# Patient Record
Sex: Male | Born: 1950
Health system: Southern US, Community
[De-identification: ages and names within clinical notes are randomized; demographics above are authoritative.]

## PROBLEM LIST (undated history)

## (undated) DIAGNOSIS — E119 Type 2 diabetes mellitus without complications: Secondary | ICD-10-CM

## (undated) DIAGNOSIS — F329 Major depressive disorder, single episode, unspecified: Secondary | ICD-10-CM

## (undated) DIAGNOSIS — I209 Angina pectoris, unspecified: Secondary | ICD-10-CM

## (undated) DIAGNOSIS — M5440 Lumbago with sciatica, unspecified side: Secondary | ICD-10-CM

## (undated) DIAGNOSIS — K852 Alcohol induced acute pancreatitis without necrosis or infection: Secondary | ICD-10-CM

## (undated) DIAGNOSIS — I509 Heart failure, unspecified: Secondary | ICD-10-CM

## (undated) DIAGNOSIS — K311 Adult hypertrophic pyloric stenosis: Secondary | ICD-10-CM

## (undated) DIAGNOSIS — J449 Chronic obstructive pulmonary disease, unspecified: Secondary | ICD-10-CM

## (undated) DIAGNOSIS — K219 Gastro-esophageal reflux disease without esophagitis: Secondary | ICD-10-CM

## (undated) DIAGNOSIS — G629 Polyneuropathy, unspecified: Secondary | ICD-10-CM

## (undated) DIAGNOSIS — R519 Headache, unspecified: Secondary | ICD-10-CM

## (undated) DIAGNOSIS — K863 Pseudocyst of pancreas: Secondary | ICD-10-CM

## (undated) DIAGNOSIS — Z720 Tobacco use: Secondary | ICD-10-CM

## (undated) DIAGNOSIS — M79672 Pain in left foot: Secondary | ICD-10-CM

## (undated) DIAGNOSIS — Z7901 Long term (current) use of anticoagulants: Secondary | ICD-10-CM

## (undated) DIAGNOSIS — F1010 Alcohol abuse, uncomplicated: Secondary | ICD-10-CM

## (undated) DIAGNOSIS — R634 Abnormal weight loss: Secondary | ICD-10-CM

## (undated) DIAGNOSIS — Q8901 Asplenia (congenital): Secondary | ICD-10-CM

## (undated) DIAGNOSIS — I482 Chronic atrial fibrillation, unspecified: Secondary | ICD-10-CM

## (undated) DIAGNOSIS — M25572 Pain in left ankle and joints of left foot: Secondary | ICD-10-CM

## (undated) DIAGNOSIS — I4891 Unspecified atrial fibrillation: Secondary | ICD-10-CM

## (undated) DIAGNOSIS — M542 Cervicalgia: Secondary | ICD-10-CM

## (undated) DIAGNOSIS — Z972 Presence of dental prosthetic device (complete) (partial): Secondary | ICD-10-CM

## (undated) DIAGNOSIS — M545 Low back pain: Secondary | ICD-10-CM

## (undated) DIAGNOSIS — T8859XA Other complications of anesthesia, initial encounter: Secondary | ICD-10-CM

## (undated) DIAGNOSIS — M7711 Lateral epicondylitis, right elbow: Secondary | ICD-10-CM

## (undated) DIAGNOSIS — I10 Essential (primary) hypertension: Secondary | ICD-10-CM

## (undated) DIAGNOSIS — K861 Other chronic pancreatitis: Secondary | ICD-10-CM

## (undated) DIAGNOSIS — F419 Anxiety disorder, unspecified: Secondary | ICD-10-CM

## (undated) DIAGNOSIS — I4821 Permanent atrial fibrillation: Secondary | ICD-10-CM

## (undated) DIAGNOSIS — Z973 Presence of spectacles and contact lenses: Secondary | ICD-10-CM

## (undated) DIAGNOSIS — R3129 Other microscopic hematuria: Secondary | ICD-10-CM

## (undated) DIAGNOSIS — G4700 Insomnia, unspecified: Secondary | ICD-10-CM

## (undated) DIAGNOSIS — J42 Unspecified chronic bronchitis: Secondary | ICD-10-CM

## (undated) DIAGNOSIS — T4145XA Adverse effect of unspecified anesthetic, initial encounter: Secondary | ICD-10-CM

## (undated) DIAGNOSIS — E440 Moderate protein-calorie malnutrition: Secondary | ICD-10-CM

## (undated) DIAGNOSIS — K859 Acute pancreatitis without necrosis or infection, unspecified: Secondary | ICD-10-CM

## (undated) DIAGNOSIS — M1990 Unspecified osteoarthritis, unspecified site: Secondary | ICD-10-CM

## (undated) DIAGNOSIS — J189 Pneumonia, unspecified organism: Secondary | ICD-10-CM

## (undated) DIAGNOSIS — E785 Hyperlipidemia, unspecified: Secondary | ICD-10-CM

## (undated) DIAGNOSIS — I4892 Unspecified atrial flutter: Secondary | ICD-10-CM

## (undated) DIAGNOSIS — R51 Headache: Secondary | ICD-10-CM

## (undated) DIAGNOSIS — M79601 Pain in right arm: Secondary | ICD-10-CM

## (undated) DIAGNOSIS — G8929 Other chronic pain: Secondary | ICD-10-CM

## (undated) HISTORY — PX: BACK SURGERY: SHX140

---
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
## 1972-07-25 HISTORY — PX: NECK SURGERY: SHX720

## 1999-04-29 ENCOUNTER — Encounter: Payer: Self-pay | Admitting: Gastroenterology

## 1999-04-29 ENCOUNTER — Inpatient Hospital Stay (HOSPITAL_COMMUNITY): Admission: EM | Admit: 1999-04-29 | Discharge: 1999-04-30 | Payer: Self-pay | Admitting: Internal Medicine

## 2000-08-22 ENCOUNTER — Inpatient Hospital Stay (HOSPITAL_COMMUNITY): Admission: EM | Admit: 2000-08-22 | Discharge: 2000-08-26 | Payer: Self-pay | Admitting: Emergency Medicine

## 2000-08-23 ENCOUNTER — Encounter: Payer: Self-pay | Admitting: Internal Medicine

## 2000-08-24 ENCOUNTER — Encounter: Payer: Self-pay | Admitting: Internal Medicine

## 2000-09-01 ENCOUNTER — Emergency Department (HOSPITAL_COMMUNITY): Admission: EM | Admit: 2000-09-01 | Discharge: 2000-09-02 | Payer: Self-pay | Admitting: Emergency Medicine

## 2000-09-03 ENCOUNTER — Inpatient Hospital Stay (HOSPITAL_COMMUNITY): Admission: EM | Admit: 2000-09-03 | Discharge: 2000-10-13 | Payer: Self-pay | Admitting: Emergency Medicine

## 2000-09-03 ENCOUNTER — Encounter (INDEPENDENT_AMBULATORY_CARE_PROVIDER_SITE_OTHER): Payer: Self-pay

## 2000-09-03 ENCOUNTER — Encounter: Payer: Self-pay | Admitting: General Surgery

## 2000-09-03 ENCOUNTER — Encounter: Payer: Self-pay | Admitting: Internal Medicine

## 2000-09-03 ENCOUNTER — Encounter: Payer: Self-pay | Admitting: Emergency Medicine

## 2000-09-04 ENCOUNTER — Encounter: Payer: Self-pay | Admitting: General Surgery

## 2000-09-05 ENCOUNTER — Encounter: Payer: Self-pay | Admitting: Internal Medicine

## 2000-09-07 ENCOUNTER — Encounter: Payer: Self-pay | Admitting: General Surgery

## 2000-09-11 ENCOUNTER — Encounter: Payer: Self-pay | Admitting: Internal Medicine

## 2000-09-15 ENCOUNTER — Encounter: Payer: Self-pay | Admitting: General Surgery

## 2000-09-19 ENCOUNTER — Encounter: Payer: Self-pay | Admitting: General Surgery

## 2000-09-20 ENCOUNTER — Encounter: Payer: Self-pay | Admitting: General Surgery

## 2000-09-22 ENCOUNTER — Encounter: Payer: Self-pay | Admitting: General Surgery

## 2000-09-25 ENCOUNTER — Encounter: Payer: Self-pay | Admitting: General Surgery

## 2000-09-26 ENCOUNTER — Encounter: Payer: Self-pay | Admitting: General Surgery

## 2000-10-02 ENCOUNTER — Encounter: Payer: Self-pay | Admitting: General Surgery

## 2000-10-03 ENCOUNTER — Encounter: Payer: Self-pay | Admitting: General Surgery

## 2000-10-09 ENCOUNTER — Encounter: Payer: Self-pay | Admitting: General Surgery

## 2000-10-10 ENCOUNTER — Encounter: Payer: Self-pay | Admitting: General Surgery

## 2000-10-23 ENCOUNTER — Encounter: Admission: RE | Admit: 2000-10-23 | Discharge: 2001-01-21 | Payer: Self-pay | Admitting: General Surgery

## 2001-07-25 HISTORY — PX: SPLENECTOMY: SUR1306

## 2003-02-10 ENCOUNTER — Ambulatory Visit (HOSPITAL_COMMUNITY): Admission: RE | Admit: 2003-02-10 | Discharge: 2003-02-10 | Payer: Self-pay | Admitting: Internal Medicine

## 2003-02-10 ENCOUNTER — Encounter: Payer: Self-pay | Admitting: Internal Medicine

## 2003-06-03 ENCOUNTER — Emergency Department (HOSPITAL_COMMUNITY): Admission: EM | Admit: 2003-06-03 | Discharge: 2003-06-03 | Payer: Self-pay | Admitting: Emergency Medicine

## 2004-12-20 ENCOUNTER — Inpatient Hospital Stay (HOSPITAL_COMMUNITY): Admission: EM | Admit: 2004-12-20 | Discharge: 2004-12-24 | Payer: Self-pay | Admitting: Emergency Medicine

## 2005-01-10 ENCOUNTER — Ambulatory Visit (HOSPITAL_COMMUNITY): Admission: RE | Admit: 2005-01-10 | Discharge: 2005-01-10 | Payer: Self-pay | Admitting: Internal Medicine

## 2006-02-09 ENCOUNTER — Encounter: Admission: RE | Admit: 2006-02-09 | Discharge: 2006-02-09 | Payer: Self-pay | Admitting: Gastroenterology

## 2006-10-24 ENCOUNTER — Inpatient Hospital Stay (HOSPITAL_COMMUNITY): Admission: RE | Admit: 2006-10-24 | Discharge: 2006-10-25 | Payer: Self-pay | Admitting: Neurosurgery

## 2006-10-24 HISTORY — PX: POSTERIOR LAMINECTOMY / DECOMPRESSION LUMBAR SPINE: SUR740

## 2006-12-15 ENCOUNTER — Inpatient Hospital Stay (HOSPITAL_COMMUNITY): Admission: EM | Admit: 2006-12-15 | Discharge: 2006-12-19 | Payer: Self-pay | Admitting: Emergency Medicine

## 2007-02-18 ENCOUNTER — Inpatient Hospital Stay (HOSPITAL_COMMUNITY): Admission: EM | Admit: 2007-02-18 | Discharge: 2007-02-25 | Payer: Self-pay | Admitting: Emergency Medicine

## 2008-09-27 ENCOUNTER — Inpatient Hospital Stay (HOSPITAL_COMMUNITY): Admission: EM | Admit: 2008-09-27 | Discharge: 2008-10-01 | Payer: Self-pay | Admitting: Emergency Medicine

## 2009-01-06 ENCOUNTER — Emergency Department (HOSPITAL_COMMUNITY): Admission: EM | Admit: 2009-01-06 | Discharge: 2009-01-06 | Payer: Self-pay | Admitting: Emergency Medicine

## 2010-11-01 LAB — DIFFERENTIAL
Basophils Absolute: 0 10*3/uL (ref 0.0–0.1)
Basophils Relative: 0 % (ref 0–1)
Eosinophils Absolute: 0.2 10*3/uL (ref 0.0–0.7)
Eosinophils Relative: 2 % (ref 0–5)
Lymphocytes Relative: 16 % (ref 12–46)
Lymphs Abs: 1.8 10*3/uL (ref 0.7–4.0)
Monocytes Absolute: 0.9 10*3/uL (ref 0.1–1.0)
Monocytes Relative: 9 % (ref 3–12)
Neutro Abs: 7.9 10*3/uL — ABNORMAL HIGH (ref 1.7–7.7)
Neutrophils Relative %: 73 % (ref 43–77)

## 2010-11-01 LAB — CBC
HCT: 51.9 % (ref 39.0–52.0)
Hemoglobin: 17.6 g/dL — ABNORMAL HIGH (ref 13.0–17.0)
MCHC: 34 g/dL (ref 30.0–36.0)
MCV: 92.6 fL (ref 78.0–100.0)
Platelets: 232 10*3/uL (ref 150–400)
RBC: 5.6 MIL/uL (ref 4.22–5.81)
RDW: 15.4 % (ref 11.5–15.5)
WBC: 10.8 10*3/uL — ABNORMAL HIGH (ref 4.0–10.5)

## 2010-11-01 LAB — COMPREHENSIVE METABOLIC PANEL
ALT: 125 U/L — ABNORMAL HIGH (ref 0–53)
AST: 127 U/L — ABNORMAL HIGH (ref 0–37)
Albumin: 3.7 g/dL (ref 3.5–5.2)
Alkaline Phosphatase: 115 U/L (ref 39–117)
BUN: 9 mg/dL (ref 6–23)
CO2: 25 mEq/L (ref 19–32)
Calcium: 8.9 mg/dL (ref 8.4–10.5)
Chloride: 107 mEq/L (ref 96–112)
Creatinine, Ser: 0.83 mg/dL (ref 0.4–1.5)
GFR calc Af Amer: >60 mL/min (ref >60)
GFR calc non Af Amer: >60 mL/min (ref >60)
Glucose, Bld: 162 mg/dL — ABNORMAL HIGH (ref 70–99)
Potassium: 4.1 mEq/L (ref 3.5–5.1)
Sodium: 136 mEq/L (ref 135–145)
Total Bilirubin: 1.2 mg/dL (ref 0.3–1.2)
Total Protein: 7.7 g/dL (ref 6.0–8.3)

## 2010-11-01 LAB — POCT CARDIAC MARKERS
CKMB, poc: <1 ng/mL — ABNORMAL LOW (ref 1.0–8.0)
Myoglobin, poc: 46.1 ng/mL (ref 12–200)
Troponin i, poc: <0.05 ng/mL (ref 0.00–0.09)

## 2010-11-01 LAB — LIPASE, BLOOD: Lipase: 149 U/L — ABNORMAL HIGH (ref 11–59)

## 2010-11-04 LAB — BASIC METABOLIC PANEL
BUN: 6 mg/dL (ref 6–23)
BUN: 9 mg/dL (ref 6–23)
CO2: 24 mEq/L (ref 19–32)
CO2: 25 mEq/L (ref 19–32)
Calcium: 8.6 mg/dL (ref 8.4–10.5)
Calcium: 8.9 mg/dL (ref 8.4–10.5)
Chloride: 104 mEq/L (ref 96–112)
Chloride: 105 mEq/L (ref 96–112)
Creatinine, Ser: 0.93 mg/dL (ref 0.4–1.5)
Creatinine, Ser: 0.95 mg/dL (ref 0.4–1.5)
GFR calc Af Amer: >60 mL/min (ref >60)
GFR calc Af Amer: >60 mL/min (ref >60)
GFR calc non Af Amer: >60 mL/min (ref >60)
GFR calc non Af Amer: >60 mL/min (ref >60)
Glucose, Bld: 120 mg/dL — ABNORMAL HIGH (ref 70–99)
Glucose, Bld: 125 mg/dL — ABNORMAL HIGH (ref 70–99)
Potassium: 3.6 mEq/L (ref 3.5–5.1)
Potassium: 4.1 mEq/L (ref 3.5–5.1)
Sodium: 134 mEq/L — ABNORMAL LOW (ref 135–145)
Sodium: 137 mEq/L (ref 135–145)

## 2010-11-04 LAB — URINALYSIS, ROUTINE W REFLEX MICROSCOPIC
Glucose, UA: NEGATIVE mg/dL
Hgb urine dipstick: NEGATIVE
Ketones, ur: 15 mg/dL — AB
Nitrite: POSITIVE — AB
Protein, ur: NEGATIVE mg/dL
Specific Gravity, Urine: 1.024 (ref 1.005–1.030)
Urobilinogen, UA: 4 mg/dL — ABNORMAL HIGH (ref 0.0–1.0)
pH: 6.5 (ref 5.0–8.0)

## 2010-11-04 LAB — HEPATIC FUNCTION PANEL
ALT: 163 U/L — ABNORMAL HIGH (ref 0–53)
ALT: 99 U/L — ABNORMAL HIGH (ref 0–53)
AST: 127 U/L — ABNORMAL HIGH (ref 0–37)
AST: 87 U/L — ABNORMAL HIGH (ref 0–37)
Albumin: 2.9 g/dL — ABNORMAL LOW (ref 3.5–5.2)
Albumin: 3.4 g/dL — ABNORMAL LOW (ref 3.5–5.2)
Alkaline Phosphatase: 123 U/L — ABNORMAL HIGH (ref 39–117)
Alkaline Phosphatase: 170 U/L — ABNORMAL HIGH (ref 39–117)
Bilirubin, Direct: 0.8 mg/dL — ABNORMAL HIGH (ref 0.0–0.3)
Bilirubin, Direct: 1 mg/dL — ABNORMAL HIGH (ref 0.0–0.3)
Indirect Bilirubin: 1.3 mg/dL — ABNORMAL HIGH (ref 0.3–0.9)
Indirect Bilirubin: 1.4 mg/dL — ABNORMAL HIGH (ref 0.3–0.9)
Total Bilirubin: 2.2 mg/dL — ABNORMAL HIGH (ref 0.3–1.2)
Total Bilirubin: 2.3 mg/dL — ABNORMAL HIGH (ref 0.3–1.2)
Total Protein: 6.8 g/dL (ref 6.0–8.3)
Total Protein: 6.9 g/dL (ref 6.0–8.3)

## 2010-11-04 LAB — COMPREHENSIVE METABOLIC PANEL
ALT: 252 U/L — ABNORMAL HIGH (ref 0–53)
ALT: 67 U/L — ABNORMAL HIGH (ref 0–53)
ALT: 94 U/L — ABNORMAL HIGH (ref 0–53)
AST: 34 U/L (ref 0–37)
AST: 352 U/L — ABNORMAL HIGH (ref 0–37)
AST: 49 U/L — ABNORMAL HIGH (ref 0–37)
Albumin: 2.9 g/dL — ABNORMAL LOW (ref 3.5–5.2)
Albumin: 3.1 g/dL — ABNORMAL LOW (ref 3.5–5.2)
Albumin: 3.8 g/dL (ref 3.5–5.2)
Alkaline Phosphatase: 134 U/L — ABNORMAL HIGH (ref 39–117)
Alkaline Phosphatase: 86 U/L (ref 39–117)
Alkaline Phosphatase: 95 U/L (ref 39–117)
BUN: 12 mg/dL (ref 6–23)
BUN: 5 mg/dL — ABNORMAL LOW (ref 6–23)
BUN: 7 mg/dL (ref 6–23)
CO2: 23 mEq/L (ref 19–32)
CO2: 23 mEq/L (ref 19–32)
CO2: 24 mEq/L (ref 19–32)
Calcium: 8.5 mg/dL (ref 8.4–10.5)
Calcium: 8.7 mg/dL (ref 8.4–10.5)
Calcium: 9.2 mg/dL (ref 8.4–10.5)
Chloride: 101 mEq/L (ref 96–112)
Chloride: 102 mEq/L (ref 96–112)
Chloride: 104 mEq/L (ref 96–112)
Creatinine, Ser: 0.75 mg/dL (ref 0.4–1.5)
Creatinine, Ser: 0.85 mg/dL (ref 0.4–1.5)
Creatinine, Ser: 1.04 mg/dL (ref 0.4–1.5)
GFR calc Af Amer: >60 mL/min (ref >60)
GFR calc Af Amer: >60 mL/min (ref >60)
GFR calc Af Amer: >60 mL/min (ref >60)
GFR calc non Af Amer: >60 mL/min (ref >60)
GFR calc non Af Amer: >60 mL/min (ref >60)
GFR calc non Af Amer: >60 mL/min (ref >60)
Glucose, Bld: 137 mg/dL — ABNORMAL HIGH (ref 70–99)
Glucose, Bld: 144 mg/dL — ABNORMAL HIGH (ref 70–99)
Glucose, Bld: 151 mg/dL — ABNORMAL HIGH (ref 70–99)
Potassium: 3.6 mEq/L (ref 3.5–5.1)
Potassium: 3.8 mEq/L (ref 3.5–5.1)
Potassium: 5.5 mEq/L — ABNORMAL HIGH (ref 3.5–5.1)
Sodium: 130 mEq/L — ABNORMAL LOW (ref 135–145)
Sodium: 133 mEq/L — ABNORMAL LOW (ref 135–145)
Sodium: 134 mEq/L — ABNORMAL LOW (ref 135–145)
Total Bilirubin: 1.4 mg/dL — ABNORMAL HIGH (ref 0.3–1.2)
Total Bilirubin: 1.8 mg/dL — ABNORMAL HIGH (ref 0.3–1.2)
Total Bilirubin: 4.3 mg/dL — ABNORMAL HIGH (ref 0.3–1.2)
Total Protein: 6.8 g/dL (ref 6.0–8.3)
Total Protein: 6.8 g/dL (ref 6.0–8.3)
Total Protein: 7.4 g/dL (ref 6.0–8.3)

## 2010-11-04 LAB — URINE MICROSCOPIC-ADD ON

## 2010-11-04 LAB — CBC
HCT: 42.7 % (ref 39.0–52.0)
HCT: 43.5 % (ref 39.0–52.0)
HCT: 43.7 % (ref 39.0–52.0)
HCT: 45.1 % (ref 39.0–52.0)
HCT: 47.5 % (ref 39.0–52.0)
Hemoglobin: 14.4 g/dL (ref 13.0–17.0)
Hemoglobin: 14.8 g/dL (ref 13.0–17.0)
Hemoglobin: 14.9 g/dL (ref 13.0–17.0)
Hemoglobin: 15.3 g/dL (ref 13.0–17.0)
Hemoglobin: 16.1 g/dL (ref 13.0–17.0)
MCHC: 33.8 g/dL (ref 30.0–36.0)
MCHC: 33.9 g/dL (ref 30.0–36.0)
MCHC: 33.9 g/dL (ref 30.0–36.0)
MCHC: 34 g/dL (ref 30.0–36.0)
MCHC: 34.1 g/dL (ref 30.0–36.0)
MCV: 93.3 fL (ref 78.0–100.0)
MCV: 93.5 fL (ref 78.0–100.0)
MCV: 93.7 fL (ref 78.0–100.0)
MCV: 94.1 fL (ref 78.0–100.0)
MCV: 94.4 fL (ref 78.0–100.0)
Platelets: 191 10*3/uL (ref 150–400)
Platelets: 208 10*3/uL (ref 150–400)
Platelets: 213 10*3/uL (ref 150–400)
Platelets: 230 10*3/uL (ref 150–400)
Platelets: 234 10*3/uL (ref 150–400)
RBC: 4.56 MIL/uL (ref 4.22–5.81)
RBC: 4.65 MIL/uL (ref 4.22–5.81)
RBC: 4.68 MIL/uL (ref 4.22–5.81)
RBC: 4.8 MIL/uL (ref 4.22–5.81)
RBC: 5.03 MIL/uL (ref 4.22–5.81)
RDW: 13.6 % (ref 11.5–15.5)
RDW: 13.7 % (ref 11.5–15.5)
RDW: 13.8 % (ref 11.5–15.5)
RDW: 13.9 % (ref 11.5–15.5)
RDW: 14.3 % (ref 11.5–15.5)
WBC: 11.1 10*3/uL — ABNORMAL HIGH (ref 4.0–10.5)
WBC: 15.3 10*3/uL — ABNORMAL HIGH (ref 4.0–10.5)
WBC: 8.4 10*3/uL (ref 4.0–10.5)
WBC: 9.2 10*3/uL (ref 4.0–10.5)
WBC: 9.9 10*3/uL (ref 4.0–10.5)

## 2010-11-04 LAB — AMYLASE: Amylase: 72 U/L (ref 27–131)

## 2010-11-04 LAB — LIPASE, BLOOD
Lipase: 23 U/L (ref 11–59)
Lipase: 808 U/L — ABNORMAL HIGH (ref 11–59)

## 2010-11-04 LAB — DIFFERENTIAL
Basophils Absolute: 0 10*3/uL (ref 0.0–0.1)
Basophils Relative: 1 % (ref 0–1)
Eosinophils Absolute: 0.3 10*3/uL (ref 0.0–0.7)
Eosinophils Relative: 3 % (ref 0–5)
Lymphocytes Relative: 19 % (ref 12–46)
Lymphs Abs: 1.9 10*3/uL (ref 0.7–4.0)
Monocytes Absolute: 0.8 10*3/uL (ref 0.1–1.0)
Monocytes Relative: 9 % (ref 3–12)
Neutro Abs: 6.8 10*3/uL (ref 1.7–7.7)
Neutrophils Relative %: 70 % (ref 43–77)

## 2010-11-04 LAB — ETHANOL: Alcohol, Ethyl (B): <5 mg/dL (ref 0–10)

## 2010-11-04 LAB — RAPID URINE DRUG SCREEN, HOSP PERFORMED
Amphetamines: NOT DETECTED
Barbiturates: NOT DETECTED
Benzodiazepines: NOT DETECTED
Cocaine: NOT DETECTED
Opiates: POSITIVE — AB
Tetrahydrocannabinol: NOT DETECTED

## 2010-11-04 LAB — HEMOGLOBIN A1C
Hgb A1c MFr Bld: 6.1 % (ref 4.6–6.1)
Mean Plasma Glucose: 128 mg/dL

## 2010-12-07 NOTE — Discharge Summary (Signed)
NAMEKINNITH, OMDAHL               ACCOUNT NO.:  [id number]   MEDICAL RECORD NO.:  [id number]          PATIENT TYPE:  INP   LOCATION:  6704                         FACILITY:  [facility]   PHYSICIAN:  Mobolaji B. Bakare, M.D.DATE OF BIRTH:  [birth year]   DATE OF ADMISSION:  02/18/2007  DATE OF DISCHARGE:  02/25/2007                               DISCHARGE SUMMARY   PRIMARY CARE PHYSICIAN:  Dr. Noel Gerold at [facility],  [facility]   FINAL DIAGNOSIS:  1. Acute pancreatitis.  2. Alcohol withdrawal.  3. Pancreatic pseudocyst.  4. Pseudomonas bacteremia.  5. Left lower lobe pneumonia.  6. Microscopic hematuria, resolved.  7. Alcohol abuse.   SECONDARY DIAGNOSES:  1. History of recurrent pancreatitis.  2. 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   PROCEDURES:  1. CT scan, abdomen.  CT scan of abdomen and pelvis showed probable      recurrent acute pancreatitis with peri-pancreatic inflammation and      ill-defined fluid collection, suspicious for pancreatic pseudocyst.      There is associated progressive dilatation of pancreatic duct and      progressive adenopathy in the retroperitoneal and base of liver      mesentery.  Clinical correlation and current imaging follow-up      recommended.  A follow-up CT scan of abdomen and pelvis done on      February 20, 2007 showed ongoing findings of acute pancreatitis,      including probable pseudocyst formation in the lesser sac and end      of pancreas, which remains unchanged.  Evaluation for pancreatic      neoplasm is again recommended, when acute inflammation phase is      resolved.  There is mid-left small pleural effusion, probable      atelectasis, multiple peri-gastric varices may reflect sequelae of      prior portal vein thrombosis, as the portal vein is diminutive and      not clearly patent throughout its course.  (The patient has had a      splenectomy), stable sludge versus contrast in the gallbladder.  2. X-ray of the  right humerus showed no acute fracture.  X-ray of      right shoulder showed no acute fracture. Chest x-ray done on the      February 22, 2007 showed increased opacity left base with some      segmental atelectasis and possible acute infiltrate.  Follow-up      chest x-ray two views done on August 3 showed worsening left lower      lobe atelectasis or infiltrate with possible small effusion.   BRIEF HISTORY:  Please refer to the admission H&P done by Dr. Jana Hakim.  In brief, Mr. Yahnke is a 60 year old Caucasian male who  presented with epigastric pain, vomiting, and history of alcohol abuse  and chronic and recurrent pancreatitis.  He was noted on admission to  have a white cell count of 22,000 with hemoglobin of 16 and a left  shift.  The lipase was elevated.  CT scan of abdomen showed features  suggestive  of recurrent acute pancreatitis.  The patient was admitted  for further evaluation.  He was noted that after catheterization to have  microscopic hematuria.  This was further evaluated with a urine culture.   HOSPITAL COURSE:  1. Acute pancreatitis.  The patient was kept n.p.o. IV fluids and      started on pain medication.  Pain improved, abdominal pain improved      gradually.  Lipase trended downwards, and he was started on clear      liquids.  Diet was advanced to low-fat diet.  The patient tolerated      a low-fat diet.  He is presently stable from acute pancreatitis      standpoint.  He has a significant history of alcohol abuse, and      this was felt to have precipitated the acute episode.  The CT scan      of abdomen did show sludge in the gallbladder versus contrast;      however, there is no significant elevation in the AST and ALT which      were 18/13 respectively.  2. Alcohol withdrawal.  The patient went into alcohol a withdrawal      about 2 days into admission.  He was started on Ativan withdrawal      protocol, and he was very agitated.  He fell a couple of  times.  X-      ray of shoulder and humerus did not show any bony fracture.  He      sustained some soft tissue injury, which responded to analgesia.      The patient is currently not agitated, oriented in time, place and      person, and he is stable for discharge.  3. Pseudomonas bacteremia.  The patient's blood culture 1/2 sets grew      Pseudomonas aeruginosa.  He was started on IV ciprofloxacin      initially.  The source is felt to be respiratory.  The patient      probably aspirated, given his underlying alcohol abuse.  Urine      culture showed no growth.  The chest x-ray showed left lower lobe      infiltrate versus atelectasis.  He will be treated with Levaquin      for 2 weeks.  The patient O2 sats is 98% on room air.  He remains      afebrile and not tachycardia.  There is a small left pleural      effusion noted on chest x-ray.  He should follow up with the chest      x-ray again in 1 week, to ensure stability and resolution of his      effusion.  4. The patient's CT scan of abdomen could not clearly defined any      solid mass in the pancreas.  A CA 19-9 was done, which was      marginally elevated at 58.8.  Normal is less than 35 units per mL.      Recommendation is to have an MRI of the pancreas done, when acute      process resolves.  He should have MRI of the pancreas is 2 to 3      weeks.  5. Alcohol abuse.  The patient needs counseling and he has been      offered counseling for alcohol abuse in the hospital.  Would      recommend follow up with [facility] chemical dependence unit  for further treatment.  The patient will be given information upon      discharge.  6. Tobacco abuse.  He started on nicotine patch and has been counseled      on quitting tobacco and alcohol.  He intends to quit both cold      [place].  7. Hematuria.  The patient was noted on admission to have large blood      in his urine with red blood cells on microscopy.  He was noted on       urinalysis also to have specific gravity of 1.045 indicating      dehydration.  The patient was well-hydrated, and a repeat      urinalysis was done which was normal.  He also had a PSA done which      was normal.   RECOMMENDATIONS:  Follow-up chest x-ray in 1 week.  Follow-up MRI pancreas in 2 to 3 weeks.  Refer out to chemical dependency rehabilitation.   DISCHARGE MEDICATIONS:  1. Levaquin 750 mg daily for 10 days.  2. Ativan 1 mg p.o. b.i.d. for 3 days, then 1 mg p.o. daily for 2      days.  3. Multivitamin once daily.  4. Nicotine patch 21 mg daily.  5. Prilosec OTC two daily.  6. Thiamine 100 mg daily.  7. Tylenol 650 mg q.4 h. p.r.n. pain.   Patient plans to follow up with his primary Care doctor in [place].  Importance of follow up was stressed. I also discussed with the  patient's niece to ensure follow up.      Mobolaji B. Maia Petties, M.D.  Electronically Signed     MBB/MEDQ  D:  02/25/2007  T:  02/26/2007  Job:  [id number]

## 2010-12-07 NOTE — H&P (Signed)
Timothy Martinez, Timothy Martinez NO.:  [id number]   MEDICAL RECORD NO.:  [id number]          PATIENT TYPE:  INP   LOCATION:  5504                         FACILITY:  [facility]   PHYSICIAN:  Jana Hakim, M.D. DATE OF BIRTH:  05-28-1951   DATE OF ADMISSION:  02/18/2007  DATE OF DISCHARGE:                              HISTORY & PHYSICAL   PRIMARY CARE PHYSICIAN:  Unassigned.   CHIEF COMPLAINTS:  Epigastric abdominal pain.   HISTORY OF PRESENT ILLNESS:  This is a 60 year old male presenting to  the emergency department with complaints of severe abdominal pain for  the past 2 days.  He reports the pain as located in the epigastric area;  and rates the pain as being a 10/10 at the worst.  He denies any  radiation of the pain into the back.  He denies having any chest pain or  shortness of breath.  He does report having decreased p.o. intake  because of the pain.  He also reports having a history of pancreatitis  in the past; and does have a history of alcohol abuse.   The patient was last hospitalized Dec 14, 2006 with abdominal pain,  chronic pancreatitis small-bowel obstruction and colitis.   PAST MEDICAL HISTORY:  1. History of pancreatitis.  2. Alcoholism.  3. Status post with splenectomy secondary to traumatic rupture.  4. History of excision of the left side of the neck.  5. Recent back surgery October 24, 2006.   MEDICATIONS:  The patient reports being on a pain medicine and a  medicine for spasms of the abdomen.  He denies being on any recent  antibiotic therapy.   ALLERGIES:  PENICILLIN causing a rash and medical records reveal an  allergy to MORPHINE SULFATE which causes itching.   SOCIAL HISTORY:  The patient is a nonsmoker and reports drinking a case  of beer in 1 week.   FAMILY HISTORY:  Noncontributory.   REVIEW OF SYSTEMS:  Pertinent for mentioned above.   PHYSICAL EXAMINATION FINDINGS:  GENERAL:  This is a 60 year old well-  nourished, well-developed  male in discomfort, but no acute distress.  VITAL SIGNS:  Temperature 97.0, blood pressure 140/75, heart rate 85,  respirations 32. O2 saturation IS 98% on room air.  HEENT: Examination normocephalic, atraumatic.  Pupils equal round  reactive to light.  There is no scleral icterus.  Funduscopic benign.  Oropharynx is clear.  NECK:  Supple full range of motion.  No thyromegaly, adenopathy, or  jugular venous distension.  CARDIOVASCULAR:  Regular rate and rhythm.  No murmurs, gallops or rubs.  LUNGS:  Clear to auscultation bilaterally.  ABDOMEN:  Positive bowel sounds, soft, mildly tender in the epigastrium.  No hepatosplenomegaly.  No rebound, no guarding.  EXTREMITIES:  Without cyanosis, clubbing or edema.  NEUROLOGIC: Examination alert and oriented x3.  There are no focal  deficits.   LABORATORY STUDIES:  White blood cell count 21.9, hemoglobin 16.1,  hematocrit 48.6, MCV 88.1, platelets 286, neutrophils 91%, lymphocytes  3%.  Sodium 135, potassium 4.2, chloride 102, carbon dioxide 27, BUN 9,  creatinine 0.82, glucose 134, lipase  77, creatinine kinase 38, CK/MB  1.0, troponin 12.02.  Beta natriuretic peptide 96.0.  Urinalysis  moderate urine hemoglobin, small bilirubin, amber-colored urine.  CT  scan of the abdomen reveals recurrent pancreatitis with progressive  dilatation of the pancreatic duct, and retroperitoneal edema with  reactive adenopathy.  Also, lipase level was found to be 77.   ASSESSMENT:  This is a 60 year old male being admitted with  1. Epigastric abdominal pain.  2. Acute pancreatitis.  3. Alcohol abuse history.  4. Hematuria.   PLAN:  The patient will be admitted to telemetry area for cardiac  monitoring.  He will be placed on clear liquid diet for bowel rest.  Cardiac enzymes will also be performed.  The patient will be placed on  pain control therapy p.r.n. along with IV antiemetic therapy p.r.n.  IV  Protonix has been ordered q.12 h.;  and the patient will  be placed on  DVT prophylaxis.  The patient will also be placed on the IV Ativan,  alcohol withdrawal protocol.  And alcohol level will be ordered along  with a urine drug screen.  A prostate-specific antigen will be ordered  secondary to the patient's hematuria.  Further workup will ensue pending  results of the pending studies.      Jana Hakim, M.D.  Electronically Signed     HJ/MEDQ  D:  02/19/2007  T:  02/19/2007  Job:  [id number]

## 2010-12-07 NOTE — Discharge Summary (Signed)
Timothy Martinez, Timothy Martinez               ACCOUNT NO.:  [id number]   MEDICAL RECORD NO.:  [id number]          PATIENT TYPE:  INP   LOCATION:  [id number]                         FACILITY:  [facility]   PHYSICIAN:  Jimmey Ralph, MD  DATE OF BIRTH:  [birth year]   DATE OF ADMISSION:  09/27/2008  DATE OF DISCHARGE:  09/30/2008                               DISCHARGE SUMMARY   PRIMARY CARE PHYSICIAN:  Dr. Tami Lin, [facility]   DISCHARGE DIAGNOSES:  1. Acute pancreatitis.  2. Alcohol abuse.  3. Tobacco dependence.   MEDICATIONS UPON DISCHARGE:  1. Diclofenac 75 mg p.o. b.i.d.  2. Lyrica 75 mg p.o. t.i.d.  3. Methocarbamol 500 mg p.o. q.6 h. p.r.n. pain.   COURSE DURING THE HOSPITAL STAY:  A 60 year old Caucasian male patient  was admitted on September 27, 2008, with complaints of abdominal pain.  The  patient states that he drinks about 6 beers per day and started  experiencing some abdominal pain.  He stated that the pain was similar  to his previous episodes of pancreatitis.  The patient was admitted and  had an elevated white count during the stay in the hospital, and the  liver functions were elevated.  The patient was kept n.p.o. for initial  24-48 hours with IV fluid hydration.  The pain gradually subsided, and  at present, the liver functions are on a downward trend.  The patient's  diet was gradually advanced from clear liquids to soft and then to full  diet.  If the patient tolerates full diet today, the patient is  medically stable to be discharged.   In view of his alcohol dependence, the patient was given Ativan protocol  for the same with thiamine.   In view of tobacco abuse, the patient has been counseled to stop  smoking.   LABORATORIES:  At the time of discharge, total WBC was 8.4, hemoglobin  14.4, hematocrit 42.7, platelets of 207, sodium 134, potassium 3.6,  chloride 104, bicarb 23, glucose 144, BUN 5, creatinine 0.75, total  bilirubin 1.8, alkaline  phosphatase 86, AST 34, ALT 67, albumin 2.9,  calcium 8.7, amylase 72.  Lipase upon discharge is 283 which has  decreased from 808 on admission.  Drug screen was positive for opiates.  Alcohol level is less than 5.  Urinalysis:  Nitrite positive, WBCs 0-2.   RADIOLOGICAL INVESTIGATIONS:  During the stay in the hospital,  ultrasound of the abdomen done on September 27, 2008.  Impression:  Pancreas  is not visualized due to overlying bowel gas, prior splenectomy, no  acute findings noted on ultrasound.   DISPOSITION:  The patient is at present medically stable to be  discharged.  It is recommended he follow up with his primary care  physician in the next 1 week.  It has been recommended to patient to  stop drinking and smoking.      Jimmey Ralph, MD  Electronically Signed     MP/MEDQ  D:  09/30/2008  T:  09/30/2008  Job:  [id number]   cc:   Linton Ham. Laney Pastor, M.D.  Fax: [phone]

## 2010-12-07 NOTE — Discharge Summary (Signed)
NAMESAMY, MCGRANN               ACCOUNT NO.:  [id number]   MEDICAL RECORD NO.:  [id number]          PATIENT TYPE:  INP   LOCATION:  [place]                         FACILITY:  [facility]   PHYSICIAN:  Jacquelynn Cree, M.D.   DATE OF BIRTH:  08/14/1950   DATE OF ADMISSION:  12/14/2006  DATE OF DISCHARGE:  12/19/2006                               DISCHARGE SUMMARY   PRIMARY CARE PHYSICIAN:  None.   DISCHARGE DIAGNOSES:  1. Abdominal pain, likely due to a combination of chronic      pancreatitis, small bowel obstruction, and colitis.  2. History of alcohol abuse.  3. Hypoalbuminemia.  4. Tobacco abuse.   DISCHARGE MEDICATIONS:  1. Cipro 500 mg p.o. b.i.d.  2. Flagyl 500 mg p.o. t.i.d.  3. Flexeril 10 mg q.8h. p.r.n.  4. Diclofenac 75 mg b.i.d. p.r.n.  5. Vicodin 5/500 one tablet q.4h. p.r.n.  6. Over-the-counter stool softener p.r.n.   CONSULTATION:  None.   HISTORY OF PRESENT ILLNESS:  The patient is a 60 year old male who  presented to the hospital with worsening abdominal pain in the setting  of a past medical history of pancreatitis.  The patient's pain had  progressively gotten worse over a 3-week period of time.  His appetite  had been poor.  On initial evaluation in the emergency department, he  did have CT scan evidence of inflammatory changes involving the pancreas  and his colon and was therefore admitted for further evaluation and  workup.  For the full details of the HPI, please see the dictated report  done by Dr. Jari Favre.   PROCEDURES AND DIAGNOSTIC STUDIES:  1. CT scan of the abdomen and pelvis on Dec 15, 2006 showed evidence      of a prior splenectomy.  Stable right lower lobe nodule.      Significant inflammatory process in the left upper quadrant      involving the tail of the pancreas and splenic flexure/proximal      descending colon.  Epicenter process appears to be at the colon      rather than the pancreatic tail, although the patient does      demonstrate  some degree of pancreatitis.  The differential      diagnosis includes colitis, infection, inflammatory bowel disease,      ischemia, as well as distal pancreatitis with secondary colonic      involvement.  There was mild dilatation of the colon proximal to      the inflamed segment suggesting a partial degree of obstruction.      Findings in the pelvis showed no additional abnormalities.  2. KUB on Dec 16, 2006 showed no significant change in large or small      bowel distension.  3. KUB on Dec 17, 2006 showed slight improvement in the ileus pattern  4. KUB on Dec 18, 2006 showed resolution of small bowel ileus.   DISCHARGE LABORATORY VALUES:  Sodium was 137, potassium 4.4, chloride  102, bicarb 20, BUN 7, creatinine 0.98, glucose 97.  White blood cell  count was 13.1, hemoglobin 14.6, hematocrit 43.6, platelets 554.  Lipase  was 23.   HOSPITAL COURSE:  Problem 1.  Abdominal pain.  The patient's abdominal  pain was worked up with a CT scan, with findings as noted above.  The  differential was broad, and the patient was empirically put on Cipro and  Flagyl along with bowel rest.  On admission by Dr. Jari Favre, however, the  antibiotics were not continued as it was felt that he perhaps was  obstipated.  The patient had no improvement in his abdominal pain, and  due to concerns for an active colitis, he was started on Zosyn on Dec 16, 2006.  With treatment with Zosyn, the patient's abdominal exam  improved, and his radiographic findings of a small bowel obstruction and  ileus also resolved.  By the day of discharge, his abdominal pain was  significantly improved, and he was tolerating a regular diet without any  nausea or vomiting.  His bowels were moving.  He was therefore  discharged on a course of therapy to include Cipro and Flagyl for a full  2-week course of treatment with antibiotics.   Problem 2.  Pancreatitis.  The patient has a history of chronic  pancreatitis.  Nevertheless,  his lipase was not elevated.  This favored  colitis as being the possible etiology of his abdominal pain and  responded well to antibiotics.  His diet is advanced, and he is  tolerating this well at this point.   Problem 3.  History of alcohol abuse.  The patient did not have any  signs of withdrawal while in the hospital.  He has been abstinent since  his prior hospitalization after his laminectomy.  He is encouraged to  continue avoidance of all alcohol.   Problem 4.  Hypoalbuminemia.  The patient's diet was advanced once he  was able to tolerate p.o.'s.  I suspect his albumin will return to  normal with recommencement of his diet.   Problem 5.  Tobacco abuse.  The patient was maintained on nicotine patch  and encouraged to maintain cessation post hospitalization.   DISPOSITION:  The patient is stable for discharge home.  He is  encouraged to set himself up with a primary care physician and to return  to the emergency department for any nonresolution of symptoms.      Jacquelynn Cree, M.D.  Electronically Signed     CR/MEDQ  D:  12/19/2006  T:  12/19/2006  Job:  [id number]

## 2010-12-07 NOTE — H&P (Signed)
NAMEJAMAURION, FEENEY NO.:  [id number]   MEDICAL RECORD NO.:  [id number]          PATIENT TYPE:  EMS   LOCATION:  ED                           FACILITY:  [facility]   PHYSICIAN:  Azzie Roup, MD      DATE OF BIRTH:  16-Jun-1951   DATE OF ADMISSION:  09/27/2008  DATE OF DISCHARGE:                              HISTORY & PHYSICAL   CHIEF COMPLAINT:  Abdominal pain.   HISTORY OF PRESENT ILLNESS:  This is a 60 year old gentleman with a  history of three prior episodes of pancreatitis and alcohol abuse who  presents to the ER after experiencing mid abdominal pain for the last 2  days.  The patient states that he typically drinks about six beers per  day and that yesterday afternoon he started experiencing some mid  abdominal pain.  As a result of the abdominal pain he was not able to  eat much and did not drink anything that day.  He went to bed at home  yesterday evening with improvement in his pain and actually felt fairly  well this morning, but then once again began experiencing more severe  mid abdominal pain by midday today.  He also had some nausea but did not  vomit.  He denies any hematuria, melena, hematochezia.  He states that  this pain was similar to that associated with his previous episodes of  pancreatitis.  He subsequently decided to come to the ER.   ALLERGIES:  PENICILLIN AND MORPHINE.   MEDICATIONS:  Include:  1. Diclofenac 75 mg p.o. b.i.d.  2. Lyrica 75 mg p.o. t.i.d.  3. Methocarbamol 500 mg p.o. every 6 hours p.r.n. pain.   PAST MEDICAL HISTORY:  Significant for pancreatitis, alcohol abuse.   PAST SURGICAL HISTORY:  He is status post splenectomy and status post  effects of back surgery in April of 2008.   SOCIAL HISTORY:  He lives in [place].  He is single.  He has two  children.  He has an 80 pack-year smoking history having smoked two  packs per day for the last 40 years.  He states he drinks approximately  six beers per day for the  last 30 years.  He denies any history of  illicit drug use.   FAMILY HISTORY:  He denies any cancers or premature coronary disease or  history of stroke in the family.   REVIEW OF SYSTEMS:  10-point review of systems was performed and was  negative with exception of the abdominal pain and nausea previously  dictated in the HPI.   PHYSICAL EXAM:  VITAL SIGNS:  Temperature 97.7, blood pressure 142/72,  pulse 74, respirations 18, 98% on room air.  IN GENERAL:  This is a middle-aged Caucasian gentleman in no acute  distress.  HEENT:  Normocephalic, atraumatic.  Extraocular movements intact.  Dry  mucous membranes.  CARDIOVASCULAR:  Regular rate and rhythm.  No murmurs, rubs or gallops.  LUNGS:  Clear to auscultation bilaterally.  No crackles, wheezes or  rhonchi.  ABDOMEN:  Soft, tender in the middle to upper mid abdomen.  Normal bowel  sounds.  EXTREMITIES:  No pretibial edema.  2+ peripheral pulses.  SKIN:  No rashes.   LABORATORIES:  WBC 9.9, hemoglobin 16.1, hematocrit 47.5, platelets 234.  Sodium 133, potassium 5.5, chloride 102, bicarb 23, BUN 12, creatinine  1.04, glucose 137.  LFTs:  Alk phos elevated at 134, AST elevated at  352.  Prior AST value was 23, ALT is 252.  Prior ALT value is 17,  albumin 3.8, calcium 9.2, lipase elevated at 308.  Urinalysis reveals  small leukocytes, large bilirubin, 15 ketones, positive nitrates, rare  bacteria.  An abdominal ultrasound conducted today in the ER for th LFT  elevations revealed negative acute findings.  No evidence of  hepatomegaly.  A prior CT of the abdomen was performed on July of 2008  which revealed evidence of pancreatitis and probable pseudocyst  formation in the lesser sac in the head of the pancreas, retroperitoneal  and mesenteric inflammatory changes and adenopathy were appreciated.  On  the CT scan there is also suggestion of evaluation for pancreatic  neoplasm recommended after the acute inflammatory phase of that  last  episode of pancreatitis was resolved.   ASSESSMENT/PLAN:  This is a 60 year old gentleman with a history of  pancreatitis and alcohol abuse who presents to the emergency room with a  repeat episode of pancreatitis.  1. Pancreatitis.  We will keep this patient nothing by mouth.  We will      provide him with intravenous fluids and provide him with pain      control.  2. Alcohol abuse.  We will check an alcohol level on this patient.  We      will place the patient on a alcohol withdrawl protocol.  We will      monitor his liver function studies as the acute liver function      study elevation is likely secondary to an alcohol-induced      transaminitis.  3. Prophylaxis.  We will place this patient on subcutaneous heparin      and an intravenous proton pump inhibitor.  4. Code status.  This patient wishes to be a full code.      Azzie Roup, MD  Electronically Signed     HR/MEDQ  D:  09/27/2008  T:  09/27/2008  Job:  [id number]

## 2010-12-07 NOTE — H&P (Signed)
Timothy Martinez, EICHENBERG NO.:  [id number]   MEDICAL RECORD NO.:  [id number]          PATIENT TYPE:  INP   LOCATION:  [id number]                         FACILITY:  [facility]   PHYSICIAN:  Cyril Mourning, D.O.    DATE OF BIRTH:  Apr 24, 1951   DATE OF ADMISSION:  12/14/2006  DATE OF DISCHARGE:                              HISTORY & PHYSICAL   PRIMARY CARE PHYSICIAN:  Unassigned.   CHIEF COMPLAINT:  Worsening abdominal pain.   HISTORY OF PRESENT ILLNESS:  Mr. Timothy Martinez is a quite pleasant 60 year old  male with prior medical history significant for pancreatitis in the past  who had undergone recent decompressive laminectomy under the services of  Dr. Luiz Ochoa of neurosurgery.  This was performed at the beginning of  April this year who had no complications.  Subsequently with relation to  this surgery had been prescribed some medications for pain including  diclofenac and Flexeril.  Mr. Timothy Martinez states that his pain with reference  to his abdomen began about three weeks ago.  He was a drinker prior to  his surgery with Dr. Luiz Ochoa; however, he states that he has not had a  drink of alcohol since that time.  He has been suffering with  constipation.  The pain progressively worsened over the past three weeks  intensifying mostly over the past 1-1/2 weeks.  His oral intake has been  poor due to poor appetite.  He has not complained of nausea or vomiting.  He states that most of the pain emanates in his lower abdomen and  subsequently involves his left upper quadrant.  He states that he has  lots of groaning in his abdomen that comes and goes.  He states that  this is quite loud.  In any event he additionally reports some darker  colored urine.  He denies blood in his stools, denies fevers.  He denies  chills.   In the emergency department he underwent a CT scan of his abdomen that  was vaguely interpreted.  It did reveal some inflammatory process  involving his pancreas in proximity to  his colon and the splenic flexure  with a broad differential mentioned including colitis, infection,  inflammatory bowel disease, ischemia, and distal pancreatitis either  secondary to or primary source of inflammation.  However, it was  interpreted as likely the colon as the epicenter of this inflammation.  Lipase is currently pending as it was not performed in the emergency  department.  He did also have some dilation of his pancreatic duct up to  7 mm.  Currently awaiting LFTs and comprehensive metabolic panel as  these were not ordered in the emergency department.  I am adding these  on presently.   PAST MEDICAL HISTORY:  This is significant for alcoholism; however, he  has remained abstinent since his back surgery on October 24, 2006.  Prior  history of pancreatitis in a mass on the head of his pancreas that was  followed per [facility] records, history of splenectomy with spontaneous  rupture apparently, a history of neck surgery in 1974 for the lump on  the  left side of his neck, and previous history of recurrent acute  pancreatitis.   MEDICATIONS AT HOME:  1. Flexeril 10 mg q.8.  2. Diclofenac 75 mg b.i.d. p.r.n.   ALLERGIES:  PENICILLIN; he develops a rash.   SOCIAL HISTORY:  He lives alone.  He is unmarried.  He is a Pharmacist, community.  Smokes about two to two and a half packs of cigarettes per day. He has  multiple siblings who live in the area; six sisters and one brother.  His brother, Konrad Dolores, is his chosen contact for emergencies.  He can be  reached at [phone].   FAMILY HISTORY:  His mother died at age 38.  She did suffer with  diabetes as well as died with a brain aneurysm.  Father died at 84 with  lung cancer.   REVIEW OF SYSTEMS:  Mr. Timothy Martinez is unable to tell me whether or not he  has lost weight.  He does exhibit anorexia, no nausea or vomiting.  No  diarrhea, but definitely constipation.  He has had some lower abdominal  discomfort.  No swelling of his lower extremities.  He  chronically has  some numbness of his left leg.  Under further Review of Systems these  are all negative.   PHYSICAL EXAMINATION:  GENERAL:  In the emergency department vital signs  were stable.  He was afebrile.  HEENT:  His head was normocephalic, atraumatic.  Extraocular muscles  were intact.  NECK:  Supple, nontender.  No palpable thyromegaly or mass.  CARDIOVASCULAR:  Revealed normal S1, S2.  LUNGS:  He exhibited normal effort.  He was in no acute respiratory  distress.  There was no dullness to percussion.  ABDOMEN:  Soft though  tender diffusely most specifically in the lower quadrants as well as  left upper quadrant.  Bowel sounds were hyperactive.  There was no  rebound.  BACK:  He had no costovertebral angle tenderness.  EXTREMITIES:  Lower extremities reveal no edema.  Peripheral pulses were  symmetrical and palpable.  Capillary  refill was less than 5.  NEUROMUSCULAR:  He exhibited no neuromuscular deficits on clinical  examination.   LABORATORY DATA:  Again, we are waiting further lab information.  However, at this time when it is available urinalysis reveals 3-6 rbcs,  mucus, positive ketones, and positive bilirubin.  White count was 11.7,  hemoglobin 14, platelets 608, MCV 88.   CT scan of his abdomen was noted above __________ status post  splenectomy and increased pancreatic edema and dilation of his  pancreatic duct to 7 mm.  There was an inflammatory process in the left  upper quadrant involving the tail of his pancreas and splenic flexure  involving the descending colon and partial bowel obstruction was noted  as well.   ASSESSMENT:  1. Abdominal pain felt to be related to this focal colonic process.      Etiology not absolutely clear per my review.  He does appear to      have increased amount of stool in his colon or perhaps constipation      is a large contributing factor.  His urinary bladder does appear to     be distended to me.  2. Colonic  obstruction, partial.  3. Acute on chronic pancreatitis with pancreatic ductal dilatation.  4. Anorexia.  5. Bilirubinuria.  6. Starvation ketosis.  7. Status post decompressive laminectomy.  8. Tobacco dependence.  9. Prior history of alcohol abuse.  However abstinent since October 24, 2006.   PLAN:  At this time will continue bowel rest, administer IV fluids and  pain control.  Will administer Fleet's enemas to clear and follow his  clinical course.  At this time it does appear that he will need a Foley  catheter, and we will see  if this will relieve some of his lower abdominal discomfort and pain.  In any event we may consider surgical evaluation if there was no  resolution as well await the results of his comprehensive metabolic  panel to assess for any type of biliary outlet obstruction.      Cyril Mourning, D.O.  Electronically Signed     ESS/MEDQ  D:  12/15/2006  T:  12/15/2006  Job:  [id number]

## 2010-12-10 NOTE — Discharge Summary (Signed)
[facility]  Patient:    Timothy Martinez, Timothy Martinez                        MRN: [id number] Adm. Date:  [id number] Disc. Date: [id number] Attending:  Cassandria Anger CC:         Sandy Salaam. Deatra Ina, M.D. [facility]   Discharge Summary  ADMISSION DIAGNOSES: 1. Recurrent pancreatitis, probably alcohol related, third episode. 2. Acute bronchitis. 3. Dehydration.  DISCHARGE DIAGNOSES: 1. Recurrent pancreatitis, probably alcohol related, third episode. 2. Acute bronchitis. 3. Dehydration.  HISTORY OF PRESENT ILLNESS:  Timothy Martinez is a 60 year old white male who presents with severe abdominal pain over several days duration with inability to eat.  He received Dilaudid IV in the emergency room with some relief. Additionally, he described black sputum production over the past 4 to 5 days.  PAST MEDICAL HISTORY:  Alcohol related pancreatitis on three occasions over the last 5 years.  Most recently in October 2000.  He states his last drink was 32 days ago.  He is a Administrator.  ADMISSION PHYSICAL EXAMINATION:  Blood pressure 131/80, pulse 114, respiratory rate 20, and temperature 97.3.  LABORATORY DATA:  The CT scan revealed fluid stranding around the pancreatic tail, compatible with acute fluid collections most likely from pancreatitis. There was suggestion of a forming pseudocyst.  Gallbladder was normal on sonogram.  There was an 11 x 4 cm fluid collection in the left upper quadrant suggesting the pseudocyst.  The CT did not suggest abscess.  White blood cell count initially was 17,700.  The day of discharge it was 11,200.  Hematocrit was 41.5.  Initially, his sodium was 133, but this was corrected.  Admission glucose was 153, but on August 23, 2000, it was 104. Calcium was 8.1 and 8.2 on two occasions.  Albumin was reduced at 2.6.  Liver enzymes were normal surprisingly.  His amylase was 558 on admission; on the day of discharge it was 275 with normals of less than  131.  Amylase decreased from an initial value of 339 to 112 at discharge.  Normal was less than 51.  He was seen in consultation by Dr. Lucio Edward.  Bowel rest was recommended. Antibiotic coverage was not felt to be necessary.  His diet was gradually advanced, and his pain resolved.  On August 25, 2000, he had two large bowel movements with dramatic improvement in his pain.  His diet was gradually advanced from clear liquids to diet as tolerated.  Dr. Fuller Plan recommended followup as an outpatient by Dr. Erskine Emery in reference to the pseudocyst.  On the day of discharge he is not in any pain, no nausea or vomiting.  Bowel sounds were present, and the abdomen was nontender.  He was afebrile with a pulse of 77.  Respiratory rate was 18, and blood pressure was 115/71.  He was sleeping comfortably prior to the exam.  The type of physiology of pancreatitis was discussed with Mr. Keizer.  He is very intelligent articulate individual.  He seems motivated to stop drinking. He has expressed interest in entering Alcoholics Anonymous.  I offered pain medications in the form of Darvocet, but he preferred to take Extra Strength Tylenol, as he said this provided excellent relief in the past in similar situations.  He was given Librium 10 mg to be taken one to two t.i.d. p.r.n.  He denied any heartburn.  It was recommended that he take Zantac 75 or Pepcid AC q.12h.  p.r.n. heartburn or dyspepsia.  His diet was to be as tolerated.  It was recommended that he avoid excess fats, grease, or diary until symptoms were completely resolved.  He was asked to call Dr. Guillermina [place] office at [phone] for follow up in 3 weeks.  He has no physician.  He was given the options of going to [place] or going to the medical assistance program at [facility] branch.  I stated that I would be happy to follow him through that program if he wished.  That will be [employer].  He  will be given this information.  CONDITION ON DISCHARGE:  Improved.  PROGNOSIS:  Depends on his motivation to address the risk factors. DD:  08/26/00 TD:  08/28/00 Job: [id number]

## 2010-12-10 NOTE — H&P (Signed)
Timothy Martinez, Timothy Martinez NO.:  [id number]   MEDICAL RECORD NO.:  [id number]          PATIENT TYPE:  INP   LOCATION:  0102                         FACILITY:  [facility]   PHYSICIAN:  Sherryl Manges, M.D.  DATE OF BIRTH:  January 04, 1951   DATE OF ADMISSION:  12/19/2004  DATE OF DISCHARGE:                                HISTORY & PHYSICAL   Primary medical doctor:  Unassigned.   CHIEF COMPLAINT:  Abdominal pain for three days.   HISTORY OF PRESENT ILLNESS:  This is a 60 year old male, with known history  of recurrent acute pancreatitis and alcohol abuse.  According to patient, he  usually drinks an average of four beers daily.  During the last week,  however, he has binged somewhat on alcohol and about six days ago had drunk  a couple of screwdrivers.  On Dec 17, 2004, he developed pain in the upper  part of  his abdomen.  This is constant and is getting progressively worse.  He vomited once.  Appetite is diminished, and he has been able to manage  only soups.  Denies diarrhea.  Denies fever, although he does have sweats.   PAST MEDICAL HISTORY:  1.  Recurrent acute pancreatitis, the last episode was approximately three      years ago.  2.  Alcohol abuse.  3.  Spontaneous splenic rupture, status post splenectomy September 03, 2000.  4.  Status post neck surgery in 1974 for a lump, left side of neck.   MEDICATIONS:  Not on any regular medications.   ALLERGIES:  PENICILLIN.   REVIEW OF SYSTEMS:  Essentially as per HPI and chief complaint, otherwise  negative.   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  Smokes about two packs of cigarettes per day, has done so for the past 12  years.  Drinks about four beers a day.  Denies drug abuse.  He has two  offspring, both of whom are alive and well.  He has one brother and six  sisters.  All his siblings are alive and well.  His mother died at age 7  years from ruptured brain aneurysm.  His father is  77 years old but health  status is unknown by patient.   PHYSICAL EXAMINATION:  VITAL SIGNS:  Temperature 97.5, pulse 90 per minute,  respiratory rate 20, BP 106/53 mmHg, pulse oximetry 97% on room air.  GENERAL:  The patient does not appear to be in obvious acute distress.  He  is, however, alert, communicative, not short of breath at rest.  HEENT:  No clinical pallor, no jaundice, no conjunctival injection.  Throat  appears quite dry.  Hydration status is also fair.  NECK:  Supple.  There is no palpable lymphadenopathy, no palpable goiter, no  carotid bruits.  JVD is not seen.  He does have an old surgical scar on the  left side of the neck.  CHEST:  Clinically clear to auscultation, no wheezes, no crackles.  CARDIAC:  Heart sounds one and two years, normal, regular, no murmurs.  ABDOMEN:  Full, soft,  mildly tender in the epigastrium.  The liver is not  palpable.  Bowel sounds are normal.  EXTREMITIES:  Lower extremity exam shows no pitting edema, palpable  peripheral pulses.  MUSCULOSKELETAL:  Quite unremarkable.  CENTRAL NERVOUS SYSTEM:  No focal neurologic deficit on gross examination.   INVESTIGATIONS:  CBC:  WBC 26.6, hemoglobin 17.3, hematocrit 51.7, platelets  275,000.  Electrolytes:  Sodium 135, potassium 4.1, chloride 102, CO2 26,  BUN 12, creatinine 1.0, glucose 178.  AST is 19, ALT 19, alkaline  phosphatase 87.  Lipase 40, amylase 96.  Urinalysis is negative.  Abdominal  CT scan dated Dec 19, 2004, confirms pancreatitis.  There is also a 5.8 cm  mass in the tail of the pancreas and a 5 cm fluid collection posterior to  the pancreas, which appears to be related to acute pancreatitis.  Incidentally, mild bibasilar air space disease is noted.   ASSESSMENT AND PLAN:  1.  Acute pancreatitis, recurrent. Likely secondary to alcohol abuse.  We      shall admit the patient and manage with IV fluids, analgesics and proton      pump inhibitors.  We shall keep him n.p.o. for  now.   1.  Pancreatic mass on abdominal CT scan.  This raises suspicion of a      neoplastic condition.  We shall therefore do CA19-9 tumor marker. It      appears the patient will benefit from a GI opinion.   1.  Bibasilar air space disease in the lungs on CT scan.  The patient has      elevated white cell count.  Although this is likely secondary to      pancreatitis, will treat empirically with Avelox for a possible      pneumonia and also do chest x-ray.   1.  Alcohol abuse.  The patient would benefit from substance abuse      counseling.  We shall watch for alcohol withdrawal phenomena.      Meanwhile, will manage vitamin supplements.   Further management will depend on clinical course.      CO/MEDQ  D:  12/20/2004  T:  12/20/2004  Job:  [id number]

## 2010-12-10 NOTE — Discharge Summary (Signed)
[facility]  Patient:    Timothy Martinez, Timothy Martinez                        MRN: [id number] Adm. Date:  [id number] Disc. Date: [id number] Attending:  Barbera Setters CC:         Heinz Knuckles. Norins, M.D. [facility]  Dellis Filbert C. Johnnye Sima, M.D.   Discharge Summary  FINAL DIAGNOSES:  1. Ruptured spleen, spontaneous.  2. Hemorrhagic shock secondary to #1.  3. Acute alcohol induced pancreatitis, recurrent.  4. Pancreatic fistula, postoperative, resolved.  5. Left subphrenic abscess, resolved (Pseudomonas infection).  6. Left pleural effusion, sterile and sympathetic, resolved following     multiple thoracentesis.  7. Left pararenal pseudocyst, resolved following drainage.  8. Tobacco abuse.  OPERATION PERFORMED:  1. Insertion of right subclavian central venous line, September 03, 2000.  2. Splenectomy, September 03, 2000.  3. Percutaneous drainage of left subphrenic fluid collection and percutaneous     drainage of left pararenal fluid collection, September 11, 2000.  4. Exchange of left subphrenic drain, September 19, 2000.  5. Left thoracentesis, September 20, 2000.  6. Left thoracentesis, October 03, 2000.  7. Left thoracentesis, October 05, 2000.  HISTORY OF PRESENT ILLNESS:  This is a 60 year old white male with a long history of alcohol abuse and tobacco abuse. He has a history of recurrent pancreatitis with hospitalizations intermittently over the past five years.  He had been recently hospitalized at [facility] January 29 through August 26, 2000 with acute pancreatitis. Ultrasound of his gallbladder is normal but CT scan was consistent with pancreatitis with phlegmon in the left upper quadrant.  He developed worsening abdominal pain and was felt dehydrated and was hypotensive and was readmitted to the hospital by Dr. Adella Hare on September 03, 2000. He was found to have a significantly tender abdomen and hypotension and was complaining of epigastric and  left flank pain with nausea and vomiting. Dr. Adella Hare the patient to the hospital with the concern that he may be septic from a pancreatic abscess.  PAST MEDICAL HISTORY:  Consistent with alcohol abuse, recurrent pancreatitis x about 5 years. He has had neck surgery in the past.  CURRENT MEDICATIONS:  None.  ALLERGIES:  PENICILLIN and MORPHINE.  PHYSICAL EXAMINATION:  GENERAL:  On initial exam, the patient was found to be diaphoretic with a gray color and appeared shocky. He was alert and cooperative but in moderately severe distress.  VITAL SIGNS:  Blood pressure was actually 110/60, oxygen saturation was 100% on supplemental oxygen, heart rate 137, temperature 96.5.  HEENT:  Sclerae were clear.  NECK:  Revealed no mass.  LUNGS:  Clear.  ABDOMEN:  Tender with guarding and rebound in the epigastrium and left upper quadrant. He was softer in the right upper quadrant and right lower quadrant. There were no bowel sounds and no mass.  ADMISSION DATA:  Serum amylase 670, hemoglobin 15.2, white count 17,500, potassium 3.6, calcium 9.1, glucose 123, liver function tests normal. Abdominal x-rays showed no free air of bowel obstruction or abnormal gas pattern.  HOSPITAL COURSE:  The patient was admitted by Dr. Adella Hare. He was given some fluid boluses and his blood pressure came up but he continued to look somewhat shocky. I was asked to see him. I felt that he was volume depleted and not perfusing well and it was not clear whether he had peritonitis with septic shock or bleeding.  I inserted a right subclavian  central venous line on February 10 to begin hyperalimentation and to assist with fluid resuscitation. Chest x-ray showed the line was in good position and hyperalimentation was ordered. A CT scan was performed later in the day on September 03, 2000 and showed a markedly enlarged spleen which was ruptured with hemoperitoneum. It also showed a small pseudocyst in  the left pararenal area which was known from his previous hospitalization. His pancreatitis did not look that bad. He had minimal pancreatic edema. There was no abscess and no specific septic focus.  I felt the patient was in hemorrhagic shock and he was taken to the operating room on February 10. I found that he had a ruptured spleen with about 1500 cc blood and clot in the abdomen and left upper quadrant. We performed a splenectomy. We did not dissect around behind the left kidney because of the intense acute and chronic inflammation. We placed drains in the left subphrenic space feeling that he was at high risk for pancreatic leak. There was no specific injury to the pancreas, however.  Postoperatively the patient became hemodynamically stable. He had some problems with atelectasis and cough because of his tobacco abuse and surgery. He was maintained on hyperalimentation and kept n.p.o. He was given pneumovax vaccine, meningococcal vaccines and HIB vaccines. He was given transfusion of 2 or 3 units of packed red blood cells and then stabilized.  The drainage from his left upper quadrant drain was stopped and the drains were removed on February 15. On February 18, he was started on enteric coated aspirin because his platelet count was 1.2 million.  He developed some pain and low grade fever. CT scan was performed which showed a left pleural effusion and a large left subphrenic fluid collection and persistence of the left perirenal fluid collection. On February 18, he underwent percutaneous drainage of the left subphrenic fluid collection which revealed 1000 cc of clear serosanguineous watery fluid which was sterile on culture but the amylase content was over 100,000. The left pararenal fluid collection was lower volume turbid brown fluid but still had an amylase content over 10,000. Both of these were felt to be related to his  pancreatitis. He continued to drain moderate amounts  from these drains but felt better as a result of the drains. He was started on subcutaneous Sandostatin on March 7 because of persistent drainage. The left pararenal drain stopped and a CT scan showed that that fluid collection was almost resolved and so the left pararenal drain was discontinued.  He continued to have intermittent problems with pain and nausea and was maintained on hypoalimentation for that reason. The drainage from his left subphrenic drain became purulent in nature and culture of the drainage grew Pseudomonas. He was started on IV Primaxin at that time. Infectious disease consult with Dr. Bobby Rumpf was obtained and he recommended 21 day course of Primaxin. The patient progressively improved thereafter. He had to undergo two more thoracentesis of his left pleural effusion but ultimately his left pleural effusions resolved. The pleural effusion cultures were negative and the amylase content was only 213 on the last drainage and he did well following that.  As the patient began to feel better, physical therapy and occupational therapy was involved. They recommended a "work hardening" program as an outpatient. The drainage from his left subphrenic fluid collection ultimately stopped and CT scan showed that there was nothing more than a small phlegmon in this area. We continued the Sandostatin and removed the left subphrenic  drain and continued the IV Primaxin. He was started on a diet and ultimately tolerated this well.  The patient was discharged on October 13, 2000. At that time, he felt good and wanted to go home. He was tolerating anywhere from 50-90% of his meals. He was having some bowel movements. His abdomen was soft with minimal left upper quadrant tenderness and the wound was well healed. He had no fever and his white count was essentially normal.  He had a PICC line in his left antecubital fossa for his Primaxin. Home health care nursing and home health  physical therapy was ordered to help him with IV Primaxin and subcutaneous Sandostatin.  DISCHARGE MEDICATIONS:  1. Aspirin 81 mg a day.  2. Chromagen one tablet p.o. b.i.d.  3. Sandostatin 100 mg subcutaneous b.i.d. through March 26 and then     discontinue.  4. Protonix 40 mg p.o. q.d.  5. Primaxin 500 mg IV q. 8h through March 29, then discontinue.  6. Resource 240 cc p.o. b.i.d.  7. Vicodin for pain.  FOLLOW-UP:  He is to follow-up with me in my office in five to seven days and to call for an appointment.  He was given a prescription for outpatient physical therapy.  He was advised of a low fat diet. He was told never to drink alcohol again. He was told to discontinue smoking altogether. DD:  10/30/00 TD:  10/30/00 Job: [id number]

## 2010-12-10 NOTE — H&P (Signed)
[facility]  Patient:    Timothy Martinez, Timothy Martinez                        MRN: [id number] Adm. Date:  [id number] Attending:  Maudry Diego                         History and Physical  CHIEF COMPLAINT:  Recurrent abdominal pain.  HISTORY OF PRESENT ILLNESS:  Timothy Martinez is a 60 year old Caucasian male who was recently admitted January 30th for pancreatitis.  During his hospital stay, he was diagnosed as having a possible early pseudocyst.  Patients pain improved significantly and he was able to take a diet and therefore was discharged, August 28, 2000.  Patient had recurrent abdominal pain and was seen in the [facility] Emergency Department, September 02, 2000, and lab at that time revealed an amylase of 429, lipase of 288, with a WBC of 13,000. Patient was sent home with pain medication.  The patient returned early in the morning of September 03, 2000 for recurrent and persistent pain with nausea and vomiting.  Repeat laboratory revealed an amylase now to 670, lipase of 531 and WBC was 17,500.  Patient is now admitted with pancreatitis with pseudocyst with possible intra-abdominal infection versus perforated viscus, for IV antibiotics, IV hydration, pain control and surgical consult.  Past medical history, family history and social history are well-documented in his original admit note.  MEDICATIONS AT THIS TIME:  Vicodin only.  REVIEW OF SYSTEMS:  Negative except for the HPI.  PHYSICAL EXAMINATION  VITAL SIGNS:  Temperature was 97.5, blood pressure 92/63, heart rate 105, respirations 23.  GENERAL:  This is a well-nourished, well-developed white male in significant pain, mildly diaphoretic.  HEENT:  Normocephalic, atraumatic.  Conjunctivae and sclerae were clear.  NECK:  Supple.  NODES:  No adenopathy was noted in the cervical or supraclavicular regions.  CHEST:  Clear to auscultation and percussion.  CARDIOVASCULAR:  Radial pulses 2+ with a regular  tachycardia without murmurs.  ABDOMEN:  Patient had no bowel sounds.  He had positive rebound, positive guarding and exquisite tenderness throughout, possibly worse in the left upper quadrant.  RECTAL:  Deferred secondary to the patient being unstable.  EXTREMITIES:  Without clubbing, cyanosis, or edema.  NEUROLOGIC:  Exam is grossly nonfocal.  ADMITTING DATA:  Hemoglobin 15.2; hematocrit 46.8; WBC was 17,500 with 70% segs, 20% lymphs, 6% monos; platelet count 738,000.  Chemistries with a sodium of 137, potassium 3.6, chloride 103, CO2 of 27, BUN of 12, creatinine 1.0, glucose 123.  LFTs were normal.  Calcium was 9.1.  Lipase was 531. Amylase was 670.  ASSESSMENT AND PLAN:  Gastrointestinal:  Patient with recurrent versus persistent pancreatitis.  Previous CT suggestive of pseudocyst.  He is now presenting with an increased amylase, increased lipase, significant leukocytosis and an acute abdomen.  At 0930 hours, the patient became diaphoretic and dropped his systolic blood pressure to 73.  He was given a 500 cc fluid bolus with improved systolic blood pressure to 110.  Plan:  Acute abdominal series, intensive care unit admit, CT scan with intravenous infusion for pancreatic study.  Patient started on Cipro 400 mg intravenously q.12h., Flagyl 500 mg intravenously q.6h.  I have contacted general surgery and spoke with Dr. Edsel Petrin. Ingram at 0915 hours.  Patient will be given Demerol 25 mg intravenously q.6h. for pain, Phenergan for nausea.  Anticipate a complex course with  severe pancreatitis with possible pseudocyst versus possible perforated viscus.  Patient is likely to be a candidate for parenteral nutritional support during his hospital course. DD:  09/03/00 TD:  09/04/00 Job: [id number]

## 2010-12-10 NOTE — Consult Note (Signed)
[facility]  Patient:    Timothy Martinez, Timothy Martinez                        MRN: [id number] Proc. Date: 09/03/00 Adm. Date:  [id number] Attending:  Maudry Diego CC:         Heinz Knuckles. Norins, M.D. [facility]   Consultation Report  REASON FOR CONSULTATION:  Severe pancreatitis.  HISTORY OF PRESENT ILLNESS:  This is a 60 year old white male with a long-standing history of alcohol abuse.  He has a history of recurrent pancreatitis, with at least three or four episodes over the past five years.  He was hospitalized here between August 22, 2000 and August 26, 2000 with acute pancreatitis.  The ultrasound of his gallbladder at that time was normal, showing no stones, but a CT scan was consistent with pancreatitis and acute fluid collections and phlegmon in the left upper quadrant.  He apparently improved and was sent home.  He was readmitted today with worsening pain, nausea, dehydration, hypotension and leukocytosis.  He complains of epigastric pain and left flank pain and diaphoresis.  Dr. Heinz Knuckles. Norins asked me to evaluate him.  PAST MEDICAL HISTORY:  Alcohol-related pancreatitis on four occasions over the last five years, most recently two weeks ago.  History of alcohol abuse. Surgery on his neck in 1974.  CURRENT MEDICATIONS:  None.  DRUG ALLERGIES:  PENICILLIN causes skin rash, and MORPHINE.  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  He used to drink at least 12 beers per day but denies alcohol intake over the past month.  FAMILY HISTORY:  Negative for coronary artery disease.  REVIEW OF SYSTEMS:  All systems are reviewed and are negative except as described above.  PHYSICAL EXAMINATION  GENERAL:  A diaphoretic middle-aged white gentleman.  His color is somewhat dusky.  He is alert and cooperative but in moderately severe distress from abdominal pain.  VITAL SIGNS:  In the ICU, his blood pressure was 111/60.  Oxygen saturation  is 100%, heart rate 137, sinus tachycardia, with temperature 96.5.  HEENT:  Sclerae clear.  Extraocular movements intact.  Oropharynx clear.  NECK:  Supple, nontender.  No adenopathy.  No thyromegaly.  No bruit.  LUNGS:  Clear to auscultation.  HEART:  Regular tachycardia.  No murmur.  ABDOMEN:  Silent.  Minimally distended.  Tender with guarding and rebound at the epigastrium and left upper quadrant.  Much more soft in the right upper quadrant and right lower quadrant and suprapubic area.  No palpable mass or hernia.  EXTREMITIES:  Palpable pulses but cool.  No edema.  NEUROLOGIC:  Grossly within normal limits.  ADMISSION DATA:  Abdominal x-ray show no free air and no evidence of bowel obstruction and no abnormal gas or fluid collections were noted.  Chest x-ray shows no free air and suggestion of a slight atelectasis at the left base.  Serum amylase 670.  Hemoglobin 15.2, WBC count 17,500.  Potassium 3.6, BUN 12, calcium 9.1, glucose 123, liver function tests normal.  IMPRESSION 1. Acute relapsing pancreatitis.  This almost certainly is alcohol related.    This is complicated by volume depletion and hypotension.  There is no    clear evidence for bleeding or perforation. 2. Possible complication with sepsis.  Agree with Cipro and Flagyl.  He will    need a CT scan to rule out abscess or pancreatitis necrosis. 3. He is malnourished and he  will need hyperalimentation.  I suggest that    central line be inserted. DD:  09/03/00 TD:  09/04/00 Job: [id number]

## 2010-12-10 NOTE — H&P (Signed)
[facility]  Patient:    Timothy Martinez, Timothy Martinez                        MRN: [id number] Adm. Date:  [id number] Attending:  Cassandria Anger                         History and Physical  DATE OF BIRTH:  July 21, 1951.  CHIEF COMPLAINT:  Abdominal pain.  HISTORY OF PRESENT ILLNESS:  The patient is a 60 year old white male who presents with complaint of severe abdominal pain over several days duration and inability to eat.  He has been feeling thirsty and drinking a lot of water lately.  He was seen by Dr. Abagail Kitchens and his pain was relieved substantially with Dilaudid 2 mg IV.  He also complains of having cough productive of white sputum over the past five days or so.  PAST MEDICAL HISTORY:  Pancreatitis, alcohol related, x 3 in the last 5 years; last episode October 2000, admitted to [facility].  Surgery in 1974.  SOCIAL HISTORY:  He is smoking.  He used to drink beer; last drink 32 days ago.  He is a Administrator.  FAMILY HISTORY:  Negative for coronary artery disease.  MEDICINES:  None including over the counter.  ALLERGIES:  PENICILLIN.  REVIEW OF SYSTEMS:  Cold symptoms, as above.  Decreased appetite over the past several days.  Cough productive of white sputum.  No urinary complaints.  The rest is as above or negative.  PHYSICAL EXAMINATION  VITAL SIGNS:  Blood pressure 131/80, pulse 114, respirations 20, temperature 97.3; on admission, 110.7.  GENERAL:  He is in no acute distress.  HEENT:  Slightly dry oral mucosa.  NECK:  Supple.  No thyromegaly or bruit.  LUNGS:  Decreased breath sounds.  No wheezes or rales.  HEART:  S1 and S2.  No murmur.  No gallop.  ABDOMEN:  Soft.  Diffusely sensitive but no rebound symptoms.  Bowel sounds hypoactive.  EXTREMITIES:  Lower extremities without edema.  NEUROLOGIC:  He is alert, oriented and cooperative.  No meningeal signs.  LABORATORY DATA:  Potassium 3.6, sodium 133, glucose 153,  ALT/AST normal, amylase 558, lipase 339.  WBC 17.7, hemoglobin 13.7.  Urinalysis within normal limits.  ASSESSMENT AND PLAN 1. Recurrent pancreatitis, likely alcohol related; this is the third episode.    Obtain an ultrasound to rule out gallstones.  Repeat complete blood count    tomorrow. 2. Previous history of alcohol abuse.  He states he has been sober for the    last 32 days.  Will use Librium p.r.n. only. 3. Acute bronchitis.  Will use Tequin 400 mg intravenously daily. 4. Abdominal pain due to problem #1.  Will use morphine intravenously p.r.n.    Lab work as above. 5. Dehydration.  Will use intravenous fluids. DD:  08/22/00 TD:  08/23/00 Job: [id number]

## 2010-12-10 NOTE — Discharge Summary (Signed)
Timothy Martinez, Timothy Martinez               ACCOUNT NO.:  [id number]   MEDICAL RECORD NO.:  [id number]          PATIENT TYPE:  INP   LOCATION:  [id number]                         [facility]AC[facility]   PHYSICIAN:  Rexene Alberts, M.D.    DATE OF BIRTH:  May 24, 1951   DATE OF ADMISSION:  12/19/2004  DATE OF DISCHARGE:  12/24/2004                                 DISCHARGE SUMMARY   DISCHARGE DIAGNOSES:  1.  Acute and chronic pancreatitis associated with a small fluid collection      posterior to the pancreas most consistent with an acute fluid collection      associated with pancreatitis per CT scan of the abdomen on May29,2006.  2.  A 5.8 cm mass in the head of the pancreas per CT scan.  3.  Constipation.  4.  Leukocytosis thought to be secondary to acute pancreatitis.  5.  Alcohol abuse.   SECONDARY DISCHARGE DIAGNOSES:  1.  Spontaneous splenic rupture status post splenectomy February10,2002 (the      patient did receive a pneumothorax during this hospitalization May2006).  2.  Status post neck surgery in 1974 for a lump  on the left side of the      neck.  3.  Recurrent acute pancreatitis.   DISCHARGE MEDICATIONS:  1.  Percocet 5 mg one to two tablets every four to six hours as needed.  2.  Multivitamin with iron once daily.  3.  Stool softener or laxative as needed daily.   DISCHARGE DISPOSITION:  The patient was discharged home in improved and  stable condition on June2, 2006.  He was advised to follow up with Dr.  Vladimir Faster in two weeks.   CONSULTATIONS:  Knox Saliva, M.D.   PROCEDURE PERFORMED:  CT scan of the abdomen and pelvis.  The results  revealed evidence of pancreatitis both acute and chronic.  There was a small  fluid collection posterior to the pancreas most consistent with an acute  fluid collection associated with pancreatitis.  A 5.8 cm mass in the tail of  the pancreas.  The CT scan of the pelvis revealed no evidence of acute  intrapelvic pathology.   HISTORY OF PRESENT  ILLNESS:  The patient is a 60 year old man with a past  medical history significant for acute pancreatitis and alcohol abuse who  presented to the emergency department on [id number] with a chief complaint  of abdominal pain.  The patient stated that he usually drank an average of  four beers daily.  However, during the week leading up to hospitalization  the patient binged not only on beer, but also on hard liquor.  The patient  subsequently  developed severe abdominal pain.  He presented to the  emergency department for treatment.   HOSPITAL COURSE:  ACUTE PANCREATITIS/PANCREATIC MASS:  The patient was  started on treatment with IV fluids, intravenous thiamine, intravenous  Protonix, empiric antibiotic treatment with Avelox, and intravenous  multivitamins.  The patient was made n.p.o. for bowel rest.  His pain was  treated with as needed Dilaudid.  Substance abuse counseling was ordered.  For further evaluation of the  patient's recurrent pancreatitis a CT scan of  the abdomen and pelvis was ordered as well as a CA 19.  The CT scan of the  abdomen and pelvis revealed evidence consistent with acute and chronic  pancreatitis.  There was also evidence of a small fluid collection posterior  to the pancreas and a 5.8 cm mass in the tail of the pancreas.  The CA 19-9  result was within normal limits at 22.3.  Given the findings of the CT scan,  gastroenterologist Dr. Vladimir Faster was consulted.  Dr. Vladimir Faster recommended  repeating the abdominal CT with contrast in 10-14 days.  He recommended  monitoring the patient's clinical course and if the patient was able to eat  without significant pain then he could be discharged.  Dr. Vladimir Faster advised  the patient to follow up with him in two weeks following the results of the  repeat CT scan.  Dr. Vladimir Faster also recommended abstinence not only from  alcohol, but also from cigarette smoking.  The patient was counseled as  such.   It is important to note  that the patient's LFTs and pancreatic enzymes were  all within normal limits during the hospital course.  The patient's pain  eventually subsided and clear liquids were started.  The patient had some  mild discomfort as the diet was progressed to full liquids; however, it was  felt that the discomfort was more secondary to constipation than it was  pancreatic pain.  The patient was therefore treated with a series of  laxatives.  Eventually the patient had multiple bowel movements and felt  significantly better.  His diet was advanced to solid foods and he tolerated  the solids very well.  The patient had no complaints of nausea or vomiting  during hospital course.  It was also noted that his white blood cell count  was elevated at 26.6.  He was started on empiric antibiotic treatment with  Avelox on admission.  The patient did not have evidence of an overt  infection.  His white count did normalize to 9.8 prior to hospital  discharge.  The leukocytosis was felt to be secondary to acute  pancreatitis/inflammation rather than infection.   An outpatient CT scan of the abdomen and pelvis has been arranged for Friday  June16,2006 at 8 am.   DISCHARGE LABORATORY DATA:  WBC 9.8, hemoglobin 15.2, MCV 91.4, platelets  365.  Sodium 139, potassium 4, chloride 107, CO2 26, glucose 141, BUN 10,  creatinine 0.9, calcium 9.  Total protein 6.9, albumin 2.7, ALT 21, AST 22,  total bilirubin 0.7, direct bilirubin 0.2, indirect bilirubin 0.7, amylase  54, lipase 24.       DF/MEDQ  D:  12/27/2004  T:  12/27/2004  Job:  [id number]   cc:   Raelyn Ensign. Vladimir Faster, M.D.  [id number] [address], [place]  [place] [address]  Fax: [phone]

## 2010-12-10 NOTE — Op Note (Signed)
[facility]  Patient:    Timothy Martinez, Timothy Martinez                        MRN: [id number] Proc. Date: 09/03/00 Adm. Date:  [id number] Attending:  Maudry Diego CC:         Heinz Knuckles. Norins, M.D. [facility]             Walker Kehr, M.D. [facility]                           Operative Report  PREOPERATIVE DIAGNOSIS:  Spontaneous splenic rupture and pancreatitis.  POSTOPERATIVE DIAGNOSIS:  Spontaneous splenic rupture and pancreatitis.  OPERATION:  Exploratory laparotomy, splenectomy.  SURGEON:  Edsel Petrin. Dalbert Batman, M.D.  FIRST ASSISTANT:  Judeth Horn, M.D.  OPERATIVE INDICATIONS:  This is a 60 year old white male who has a history of alcohol abuse and recurrent pancreatitis.  He has had about four episodes of pancreatitis over the past five years.  He was recently hospitalized here about two weeks ago with acute pancreatitis but recovered.  His ultrasound of his gallbladder was normal, but the CT showed acute edematous pancreatitis and a developing pseudocyst in the left pararenal space posteriorly.  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  Yesterday he developed left flank pain which became progressive and became quite severe.  he presented to he emergency room this morning in severe pain and in hypotension and diaphoresis.  He was found to have an elevated white blood cell count, elevated amylase, but normal liver function tests and tenderness and rigidity of the abdomen.  CT scan showed a huge hematoma around the spleen and with blood in the pelvis and around the liver.  It was felt that he most likely had spontaneous rupture of his spleen either secondary to splenic venin thrombosis or due to erosion of his pancreatitis into adjacent blood vessels.  There was no evidence of trauma.  He was brought to the operating room emergently following fluid resuscitation.  OPERATIVE FINDINGS:  The patient had 1500 cc or more of blood and clot in  the abdomen.  He had a huge hematoma around the spleen in the left upper quadrant and the splenic capsule and was completely disintegrated.  There was a large amount of blood in the pelvis and a large amount of blood and clot in the subhepatic space.  The liver appeared healthy.  There was evidence of pancreatitis present with thick peel in the left upper quadrant, and there was evidence of fat necrosis in the tail of the pancreas and in the root of the mesentery.  There was no evidence of abscess, however.  We could not drain the fluid collection posterior to the left kidney because of the acute inflammation and fear that we would injure the left colon.  There was no evidence of GI tract perforation.  OPERATIVE TECHNIQUE:  Following the induction of general endotracheal anesthesia, the patients abdomen was prepped and draped in a sterile fashion. A left subcostal incision was made and was extended across the midline for a short distance.  Rectus muscles were divided with electrocautery.  The abdomen was entered under direct vision.  Self-retaining retractors were placed.  The abdomen was explored with findings as described above.  We evacuated all of the hematoma and blood from the pelvis and the subhepatic space.  After we had good exposure and the patient was stable,  we then entered the left upper quadrant with sharp and blunt dissection, entering the hematoma around the spleen.  We evacuated the hematoma expeditiously and mobilized the spleen.  We could see that the splenic capsule was basically gone, and the spleen was slowly oozing.  We were able to isolate the hilar vessels individually and divided them between clamps and then removed the spleen. Hilar vessels were suture ligated with 2-0 silk sutures, and some smaller vessels were ligated with 2-0 silk ties.  There were a few bleeders in the hilum that had to be suture ligated with 2-0 silk sutures.  There was a peel in the left  upper quadrant below the diaphragm, and that was simply debrided. Hemostasis was excellent and achieved with electrocautery.  We then irrigated the most subphrenic spaces right and left, subhepatic space, the abdomen, and the pelvis with several liters of saline, evacuating all the clot and blood.  We placed Surgicel gauze in the left upper quadrant on the diaphragm and on the tail of the pancreas.  After about 5 minutes, we had excellent hemostasis.  We felt that we should drain this area for fear that he might develop pancreatic fistula.  We placed two 19-French Blake drains in the left upper quadrant and brought these out in the left mid abdomen through separate stab incisions.  These were secured to the skin with nylon sutures and connected to suction bulb.  The omentum was returned to its anatomic position.  The posterior rectus sheath was closed on each side with separate, individual running sutures of #1 PDS.  The anterior rectus sheath was closed on each side with separate, individual running sutures of #1 PDS.  The skin was closed with skin staples. Clean bandages were placed, and the patient was taken to the recovery room in stable condition.  Estimated blood loss was about 1500 to 2000 cc of old blood plus about 500 cc of active bleeding during the case.  Sponge, needle, and instruments counts were correct. DD:  09/03/00 TD:  09/04/00 Job: [id number]

## 2010-12-10 NOTE — Consult Note (Signed)
NAMERAYGEN, CASTIGLIONI               ACCOUNT NO.:  [id number]   MEDICAL RECORD NO.:  [id number]          PATIENT TYPE:  INP   LOCATION:  [id number]                         FACILITY:  [facility]   PHYSICIAN:  Raelyn Ensign. Santogade, M.D.DATE OF BIRTH:  [birth year]   DATE OF CONSULTATION:  12/22/2004  DATE OF DISCHARGE:                                   CONSULTATION   REASON FOR CONSULTATION:  Mr. Gribben is a 60 year old male admitted with  abdominal pain. He has a history of recurrent acute pancreatitis secondary  to alcohol abuse. He in the recent past has been drinking approximately four  beers per day and had been binging somewhat prior to the onset of this  problem, despite advice to be abstinent. In addition, he smokes two packs of  cigarettes per day which is associated with recurrent pancreatitis. On  admission, he described band-like pain across the epigastrium radiating down  both flanks. His admission white blood count was 26.6, but this has  decreased to 13.8 with hydration and antibiotics. Interestingly, liver  function tests, amylase, and lipase were normal. CA 19-9 is normal at 22.3.  A CT scan demonstrated a fluid collection near the tail of the pancreas with  a 5.8 cm mass in the tail of the pancreas. 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 At the current moment, he  says he feels much better than he did on admission, he is not having fevers  or chills, nausea or vomiting, and tolerated soft food for lunch.   PAST MEDICAL HISTORY:  Pertinent for alcohol abuse, recurrent pancreatitis,  and splenectomy.   CURRENT MEDICATIONS IN-HOSPITAL:  1.  Lovenox 40 mg subcu daily.  2.  Protonix 40 mg IV daily.  3.  Thiamine.  4.  Avelox 40 mg daily.  5.  Reglan 10 mg IV q.8h.  6.  Dilaudid 2 mg q.3h. p.r.n.  7.  Ativan p.r.n.   ALLERGIES:  PENICILLIN.   FAMILY HISTORY:  Noncontributory.   SOCIAL HISTORY:  Alcohol abuse and recent binge drinking. Two  packs per day  cigarette smoker. Single. Truck driver.   REVIEW OF SYSTEMS:  GENERAL:  No weight loss or night sweats. ENDOCRINE:  No  history of diabetes or thyroid problems. SKIN:  No rash or pruritus. EYES:  No icterus or change in vision. ENT:  No aphthous ulcers or chronic sore  throat. RESPIRATORY:  No shortness of breath, cough, or wheezing. CARDIAC:  No chest pain, palpitations, or history of valvular heart disease. GI:  As  above. GU:  No dysuria or hematuria. Remainder of review of systems is  negative.   PHYSICAL EXAMINATION:  GENERAL:  He is a well-developed, well-nourished  adult male in no acute distress.  VITAL SIGNS:  Afebrile, blood pressure 115/77, pulse 93 and regular.  SKIN:  Normal.  HEENT:  Eyes anicteric, conjunctivae pink, oropharynx unremarkable.  NECK:  Supple without thyromegaly.  CHEST:  Has a few scattered rhonchi.  HEART SOUNDS:  Regular rate and rhythm without murmurs.  ABDOMEN:  Soft with a well-healed horizontal epigastric  scar. There is  minimal left upper quadrant tenderness to deep palpation. There is no mass,  rebound, or hernia.  RECTAL:  Exam not performed.  EXTREMITIES:  Without cyanosis, clubbing, edema, or rash.   ADDITIONAL LABORATORY TESTS:  Current hemoglobin 14.3. BUN 12, creatinine 1,  glucose 178.   IMPRESSION:  A 60 year old male with probable focal acute pancreatitis in  the tail of the pancreas with local fluid collection. He is at risk for  pancreatic neoplasm, more from his smoking than from his drinking. However,  CA 19-9 is negative. Since he is improved at present, the following are my  recommendations:  1.  Counsel abstinence.  2.  Counsel discontinuation of cigarette smoking.  3.  Repeat abdominal CT scan with contrast in 10-14 days. This could be      accomplished as an outpatient if he is tolerating food and liquids and      without significant ongoing pain.   I will be glad to follow up following his CT  scan.       PJS/MEDQ  D:  12/22/2004  T:  12/22/2004  Job:  [id number]   cc:   Rexene Alberts, M.D.

## 2010-12-10 NOTE — Op Note (Signed)
Timothy Martinez, Timothy Martinez NO.:  [id number]   MEDICAL RECORD NO.:  [id number]          PATIENT TYPE:  INP   LOCATION:  2899                         FACILITY:  [place]   PHYSICIAN:  Otilio Connors, M.D.  DATE OF BIRTH:  Oct 30, 1950   DATE OF PROCEDURE:  10/24/2006  DATE OF DISCHARGE:                               OPERATIVE REPORT   DIAGNOSES:  Lumbar spondylosis, stenosis, herniated nucleus pulposus,  with left-sided radiculopathy at the L4-5 and 5-1 levels.   POSTOPERATIVE DIAGNOSES:  Lumbar spondylosis, stenosis, herniated  nucleus pulposus, with left-sided radiculopathy at the L4-5 and 5-1  levels.   PROCEDURES:  Decompressive laminectomy, decompression of the nerve roots  of L4, L5 and S1 with a 5-1 diskectomy, microdissection with the  microscope.   SURGEON:  Otilio Connors, M.D.   ASSISTANT:  Ashok Pall, M.D.   ANESTHESIA:  General endotracheal tube anesthesia.   ESTIMATED BLOOD LOSS:  Minimal.   BLOOD GIVEN:  None.   DRAINS:  None.   COMPLICATIONS:  None.   REASON FOR PROCEDURES:  The patient is a 60 year old gentleman, who has  had back and left leg pain with numbness, cramps and spasms since  November.  MRI was done, showing spondylotic changes with severe lateral  recess stenosis and 4-5 and 5-1 disk herniations.  At the left 5-1, and  possibly 4-5, the patient had epidural steroid injection, which improved  symptoms, but only for a few weeks.  The patient is brought in for a  decompressive laminectomy.   PROCEDURES IN DETAIL:  The patient was brought into the operating room  and general anesthesia was induced.  The patient was placed in a prone  position on a Wilson frame with all pressure points padded.  The patient  was prepped and draped in the usual sterile fashion.  The site of  incision was injected with 10 mL of 1% lidocaine with epinephrine.  Incision was made in the midline of the lower lumbar spine.  The  incision was taken down  to the fascia and hemostasis was obtained with  Bovie cauterization.  The fascia was incised on the left side, and  subperiosteal dissection over the L4, 5 and S1 spinous processes done  out to the facets.  Markers were placed at the 4-5 and 5-1 interspaces  and a lateral x-ray was obtained, confirming our positioning.  A high-  speed drill was used to start an aggressive laminectomy on the left side  at L4, 5 and S1.  The microscope was brought in for microdissection.  This decompressive laminectomy was continued with the high-speed drill,  and then Kerrison punches.  Medial facetectomy was performed.  Hypertrophic ligament was removed, along with the medial facets,  decompressing the L4 and L5 roots.  We checked the disk space and,  though the MRI showed a broad-based evulsion, it was not all that  appreciable intraoperatively, and after medial facetectomy and  decompressive laminectomy, the thecal sac now was open and the 4 and 5  roots were well decompressed.  Attention was moved down to the 5 and S1  level and Kerrison punches were used to continue the dissection and do a  foraminotomy over the S1 roots so we could see the L5 roots.  A lateral  recess and a little medial facet also was removed, and this decompressed  the central canal.  We explored the epidural space and a large disk  protrusion pushing up from the S1 root and the thecal sac was found.  The disk space was incised and diskectomy was done with pituitary  rongeurs and curets.  Immediately there was an annular tear and pieces  of disk pushing up into the medial root, and this was removed by careful  microscopic dissection, and removed with pituitary rongeurs and Kerrison  punches.  When we were finished, we had good central canal decompression  and good decompression of the S1 roots and the 5 roots.  We got  hemostasis with bipolar cauterization and Gelfoam and thrombin.  The  Gelfoam was then irrigated out.  We again  explored our decompression and  we had a decompressive laminectomy, and we further explored the 4 and 5  roots and the S1 roots down the left side.  The wound was irrigated with  antibiotic solution.  Retractors were removed.  We had good hemostasis,  and the fascia was closed with 0 Vicryl interrupted suture.  Prior to  this, Depo-Medrol was placed in the epidural space over the dura.  The  subcutaneous tissue was then closed with 0, 2-0 and 3-0 Vicryl  interrupted sutures.  The skin was closed with Dermabond and skin glue.  When that was dry, a dressing was placed.  The patient was placed back  in the supine position, awakened from anesthesia and transferred to the  recovery room in stable condition.           ______________________________  Otilio Connors, M.D.     JRH/MEDQ  D:  10/24/2006  T:  10/24/2006  Job:  [id number]

## 2011-05-09 LAB — DIFFERENTIAL
Basophils Absolute: 0
Basophils Relative: 0
Eosinophils Absolute: 0
Eosinophils Relative: 0
Lymphocytes Relative: 3 — ABNORMAL LOW
Lymphs Abs: 0.6 — ABNORMAL LOW
Monocytes Absolute: 1.4 — ABNORMAL HIGH
Monocytes Relative: 6
Neutro Abs: 19.9 — ABNORMAL HIGH
Neutrophils Relative %: 91 — ABNORMAL HIGH

## 2011-05-09 LAB — LIPASE, BLOOD
Lipase: 17
Lipase: 11
Lipase: 11
Lipase: 77 — ABNORMAL HIGH

## 2011-05-09 LAB — URINE MICROSCOPIC-ADD ON

## 2011-05-09 LAB — HEPATIC FUNCTION PANEL
ALT: 13
AST: 18
Albumin: 2.9 — ABNORMAL LOW
Alkaline Phosphatase: 94
Bilirubin, Direct: 0.2
Indirect Bilirubin: 0.7
Total Bilirubin: 0.9
Total Protein: 7.4

## 2011-05-09 LAB — URINALYSIS, ROUTINE W REFLEX MICROSCOPIC
Glucose, UA: NEGATIVE
Glucose, UA: NEGATIVE
Ketones, ur: 15 — AB
Ketones, ur: 40 — AB
Leukocytes, UA: NEGATIVE
Nitrite: POSITIVE — AB
Nitrite: NEGATIVE
Protein, ur: NEGATIVE
Protein, ur: NEGATIVE
Specific Gravity, Urine: 1.023
Specific Gravity, Urine: 1.045 — ABNORMAL HIGH
Urobilinogen, UA: 2 — ABNORMAL HIGH
Urobilinogen, UA: 0.2
pH: 6
pH: 5.5

## 2011-05-09 LAB — LIPID PANEL
Cholesterol: 169
HDL: 27 — ABNORMAL LOW
LDL Cholesterol: 125 — ABNORMAL HIGH
Total CHOL/HDL Ratio: 6.3
Triglycerides: 84
VLDL: 17

## 2011-05-09 LAB — BASIC METABOLIC PANEL
BUN: 5 — ABNORMAL LOW
BUN: 6
CO2: 25
CO2: 24
Calcium: 8.7
Calcium: 8.9
Chloride: 106
Chloride: 103
Creatinine, Ser: 0.75
Creatinine, Ser: 0.79
GFR calc Af Amer: >60
GFR calc Af Amer: >60
GFR calc non Af Amer: >60
GFR calc non Af Amer: >60
Glucose, Bld: 101 — ABNORMAL HIGH
Glucose, Bld: 100 — ABNORMAL HIGH
Potassium: 3.6
Potassium: 3.9
Sodium: 138
Sodium: 137

## 2011-05-09 LAB — COMPREHENSIVE METABOLIC PANEL
ALT: 17
ALT: 17
ALT: 21
AST: 22
AST: 23
AST: 25
Albumin: 2 — ABNORMAL LOW
Albumin: 2.3 — ABNORMAL LOW
Albumin: 3.2 — ABNORMAL LOW
Alkaline Phosphatase: 100
Alkaline Phosphatase: 100
Alkaline Phosphatase: 83
BUN: 5 — ABNORMAL LOW
BUN: 8
BUN: 9
CO2: 22
CO2: 25
CO2: 27
Calcium: 8.2 — ABNORMAL LOW
Calcium: 8.3 — ABNORMAL LOW
Calcium: 9
Chloride: 100
Chloride: 102
Chloride: 103
Creatinine, Ser: 0.62
Creatinine, Ser: 0.82
Creatinine, Ser: 0.91
GFR calc Af Amer: >60
GFR calc Af Amer: >60
GFR calc Af Amer: >60
GFR calc non Af Amer: >60
GFR calc non Af Amer: >60
GFR calc non Af Amer: >60
Glucose, Bld: 134 — ABNORMAL HIGH
Glucose, Bld: 82
Glucose, Bld: 94
Potassium: 3.4 — ABNORMAL LOW
Potassium: 3.7
Potassium: 4.2
Sodium: 129 — ABNORMAL LOW
Sodium: 133 — ABNORMAL LOW
Sodium: 135
Total Bilirubin: 0.9
Total Bilirubin: 0.9
Total Bilirubin: 1
Total Protein: 6.1
Total Protein: 6.6
Total Protein: 7.9

## 2011-05-09 LAB — CBC
HCT: 39
HCT: 40.4
HCT: 48.6
Hemoglobin: 13
Hemoglobin: 13.5
Hemoglobin: 16.1
MCHC: 33.2
MCHC: 33.3
MCHC: 33.4
MCV: 87
MCV: 87.4
MCV: 88.1
Platelets: 246
Platelets: 248
Platelets: 286
RBC: 4.46
RBC: 4.65
RBC: 5.52
RDW: 18.2 — ABNORMAL HIGH
RDW: 18.2 — ABNORMAL HIGH
RDW: 18.4 — ABNORMAL HIGH
WBC: 10.2
WBC: 13.8 — ABNORMAL HIGH
WBC: 21.9 — ABNORMAL HIGH

## 2011-05-09 LAB — CK TOTAL AND CKMB (NOT AT ARMC)
CK, MB: 1
CK, MB: 1.1
CK, MB: 1.1
CK, MB: 1.9
Relative Index: INVALID
Relative Index: INVALID
Relative Index: INVALID
Relative Index: INVALID
Total CK: 34
Total CK: 38
Total CK: 38
Total CK: 46

## 2011-05-09 LAB — CANCER ANTIGEN 19-9: CA 19-9: 58.8 — ABNORMAL HIGH (ref <35.0)

## 2011-05-09 LAB — TROPONIN I
Troponin I: 0.02
Troponin I: 0.02
Troponin I: 0.03
Troponin I: 0.05

## 2011-05-09 LAB — TSH
TSH: 1.482
TSH: 1.657

## 2011-05-09 LAB — B-NATRIURETIC PEPTIDE (CONVERTED LAB): Pro B Natriuretic peptide (BNP): 96

## 2011-05-09 LAB — ETHANOL: Alcohol, Ethyl (B): <5

## 2011-05-09 LAB — HEMOGLOBIN A1C: Hgb A1c MFr Bld: 6

## 2011-05-09 LAB — PSA
PSA: 1.34
PSA: 1.67

## 2011-05-09 LAB — CULTURE, BLOOD (ROUTINE X 2)
Culture: NO GROWTH
Culture: NO GROWTH

## 2011-05-09 LAB — RAPID URINE DRUG SCREEN, HOSP PERFORMED
Amphetamines: NOT DETECTED
Barbiturates: NOT DETECTED
Benzodiazepines: NOT DETECTED
Cocaine: NOT DETECTED
Opiates: POSITIVE — AB
Tetrahydrocannabinol: NOT DETECTED

## 2011-05-09 LAB — VANCOMYCIN, TROUGH: Vancomycin Tr: 6.7

## 2011-05-28 ENCOUNTER — Emergency Department (HOSPITAL_COMMUNITY): Payer: Medicare Other

## 2011-05-28 ENCOUNTER — Observation Stay (HOSPITAL_COMMUNITY)
Admission: EM | Admit: 2011-05-28 | Discharge: 2011-05-30 | Disposition: A | Discharge status: Home / Self Care | Payer: Medicare Other | Attending: Family Medicine | Admitting: Family Medicine

## 2011-05-28 ENCOUNTER — Encounter: Payer: Self-pay | Admitting: Family Medicine

## 2011-05-28 DIAGNOSIS — K859 Acute pancreatitis without necrosis or infection, unspecified: Principal | ICD-10-CM | POA: Insufficient documentation

## 2011-05-28 DIAGNOSIS — E785 Hyperlipidemia, unspecified: Secondary | ICD-10-CM | POA: Diagnosis present

## 2011-05-28 DIAGNOSIS — F17200 Nicotine dependence, unspecified, uncomplicated: Secondary | ICD-10-CM | POA: Insufficient documentation

## 2011-05-28 DIAGNOSIS — E119 Type 2 diabetes mellitus without complications: Secondary | ICD-10-CM | POA: Insufficient documentation

## 2011-05-28 DIAGNOSIS — K860 Alcohol-induced chronic pancreatitis: Secondary | ICD-10-CM

## 2011-05-28 DIAGNOSIS — R079 Chest pain, unspecified: Secondary | ICD-10-CM | POA: Insufficient documentation

## 2011-05-28 DIAGNOSIS — R1013 Epigastric pain: Secondary | ICD-10-CM | POA: Insufficient documentation

## 2011-05-28 DIAGNOSIS — F1010 Alcohol abuse, uncomplicated: Secondary | ICD-10-CM | POA: Diagnosis present

## 2011-05-28 DIAGNOSIS — K861 Other chronic pancreatitis: Secondary | ICD-10-CM | POA: Insufficient documentation

## 2011-05-28 DIAGNOSIS — Z720 Tobacco use: Secondary | ICD-10-CM | POA: Diagnosis present

## 2011-05-28 LAB — URINALYSIS, ROUTINE W REFLEX MICROSCOPIC
Bilirubin Urine: NEGATIVE
Glucose, UA: 250 mg/dL — AB
Hgb urine dipstick: NEGATIVE
Ketones, ur: NEGATIVE mg/dL
Leukocytes, UA: NEGATIVE
Nitrite: NEGATIVE
Protein, ur: NEGATIVE mg/dL
Specific Gravity, Urine: 1.019 (ref 1.005–1.030)
Urobilinogen, UA: 0.2 mg/dL (ref 0.0–1.0)
pH: 5.5 (ref 5.0–8.0)

## 2011-05-28 LAB — BASIC METABOLIC PANEL
BUN: 11 mg/dL (ref 6–23)
CO2: 27 mEq/L (ref 19–32)
Calcium: 9.9 mg/dL (ref 8.4–10.5)
Chloride: 102 mEq/L (ref 96–112)
Creatinine, Ser: 0.74 mg/dL (ref 0.50–1.35)
GFR calc Af Amer: >90 mL/min (ref >90)
GFR calc non Af Amer: >90 mL/min (ref >90)
Glucose, Bld: 131 mg/dL — ABNORMAL HIGH (ref 70–99)
Potassium: 5 mEq/L (ref 3.5–5.1)
Sodium: 138 mEq/L (ref 135–145)

## 2011-05-28 LAB — LIPASE, BLOOD: Lipase: 275 U/L — ABNORMAL HIGH (ref 11–59)

## 2011-05-28 LAB — DIFFERENTIAL
Basophils Absolute: 0.1 K/uL (ref 0.0–0.1)
Basophils Relative: 1 % (ref 0–1)
Eosinophils Absolute: 0.2 K/uL (ref 0.0–0.7)
Eosinophils Relative: 2 % (ref 0–5)
Lymphocytes Relative: 24 % (ref 12–46)
Lymphs Abs: 2.5 K/uL (ref 0.7–4.0)
Monocytes Absolute: 1.4 K/uL — ABNORMAL HIGH (ref 0.1–1.0)
Monocytes Relative: 14 % — ABNORMAL HIGH (ref 3–12)
Neutro Abs: 6.1 K/uL (ref 1.7–7.7)
Neutrophils Relative %: 59 % (ref 43–77)

## 2011-05-28 LAB — CBC
HCT: 49.3 % (ref 39.0–52.0)
Hemoglobin: 17.5 g/dL — ABNORMAL HIGH (ref 13.0–17.0)
MCH: 32.7 pg (ref 26.0–34.0)
MCHC: 35.5 g/dL (ref 30.0–36.0)
MCV: 92.1 fL (ref 78.0–100.0)
Platelets: 227 10*3/uL (ref 150–400)
RBC: 5.35 MIL/uL (ref 4.22–5.81)
RDW: 14 % (ref 11.5–15.5)
WBC: 10.3 10*3/uL (ref 4.0–10.5)

## 2011-05-28 LAB — CARDIAC PANEL(CRET KIN+CKTOT+MB+TROPI)
CK, MB: 2.2 ng/mL (ref 0.3–4.0)
Relative Index: INVALID (ref 0.0–2.5)
Total CK: 83 U/L (ref 7–232)
Troponin I: <0.3 ng/mL (ref <0.30)

## 2011-05-28 LAB — TROPONIN I: Troponin I: <0.3 ng/mL

## 2011-05-28 MED ORDER — NICOTINE 7 MG/24HR TD PT24
7.0000 mg | MEDICATED_PATCH | Freq: Every day | TRANSDERMAL | Status: DC | PRN
  Administered 2011-05-29: 7 mg via TRANSDERMAL
  Filled 2011-05-28: qty 1

## 2011-05-28 MED ORDER — SODIUM CHLORIDE 0.45 % IV SOLN
INTRAVENOUS | Status: DC
  Administered 2011-05-29 – 2011-05-30 (×3): via INTRAVENOUS

## 2011-05-28 MED ORDER — PANTOPRAZOLE SODIUM 40 MG IV SOLR
40.0000 mg | Freq: Every day | INTRAVENOUS | Status: DC
  Administered 2011-05-29: 40 mg via INTRAVENOUS
  Filled 2011-05-28 (×4): qty 40

## 2011-05-28 MED ORDER — HEPARIN SODIUM (PORCINE) 5000 UNIT/ML IJ SOLN
5000.0000 [IU] | Freq: Three times a day (TID) | INTRAMUSCULAR | Status: DC
  Administered 2011-05-29 – 2011-05-30 (×5): 5000 [IU] via SUBCUTANEOUS
  Filled 2011-05-28 (×8): qty 1

## 2011-05-28 MED ORDER — HYDROMORPHONE HCL PF 1 MG/ML IJ SOLN
0.5000 mg | INTRAMUSCULAR | Status: DC | PRN
  Administered 2011-05-29 – 2011-05-30 (×9): 1 mg via INTRAVENOUS
  Filled 2011-05-28 (×7): qty 1

## 2011-05-28 NOTE — H&P (Signed)
Timothy Martinez is an 60 y.o. male.    Chief Complaint: chest/mid-epigastric pain, pancreatitis  HPI: Briefly, this is a 60 year old male who presents to [facility] with chest pain.  Patient does not have a PCP.  Pain started this morning after drinking 3 cups of coffee and eating a sandwich.  Pain was located at mid-sternum and did not radiate.  Pain was worse with walking around his house, so he called EMS.  Chest pain was not associated with SOB, nausea/vomiting, diaphoresis.  In ambulance, chest pain resolved after nitro x 3 doses.  Upon arrival to ED, patient developed mid-epigastric pain.  He has a history of pancreatitis and patient says this is his typical presentation.  He drinks about 8 beers/day and last drank yesterday around 9 pm.  His has been hospitalized in the past for pancreatitis - last admission was 2 years ago.  Pain has come down from a 10 to 5 after Dilaudid.  EKG showed NSR, no ST elevation.  Cardiac enzymes negative x 1.  Lipase 275.  We were called to admit patient for pancreatitis.  PMH, FH, and SH: please see excellent PGY-1 H&P  Allergies: NKDA  Medications: None  Results for orders placed during the hospital encounter of 05/28/11 (from the past 48 hour(s))  URINALYSIS, ROUTINE W REFLEX MICROSCOPIC     Status: Abnormal   Collection Time   05/28/11  2:20 PM      Component Value Range Comment   Color, Urine YELLOW  YELLOW     Appearance CLOUDY (*) CLEAR     Specific Gravity, Urine 1.019  1.005 - 1.030     pH 5.5  5.0 - 8.0     Glucose, UA 250 (*) NEGATIVE (mg/dL)    Hgb urine dipstick NEGATIVE  NEGATIVE     Bilirubin Urine NEGATIVE  NEGATIVE     Ketones, ur NEGATIVE  NEGATIVE (mg/dL)    Protein, ur NEGATIVE  NEGATIVE (mg/dL)    Urobilinogen, UA 0.2  0.0 - 1.0 (mg/dL)    Nitrite NEGATIVE  NEGATIVE     Leukocytes, UA NEGATIVE  NEGATIVE  MICROSCOPIC NOT DONE ON URINES WITH NEGATIVE PROTEIN, BLOOD, LEUKOCYTES, NITRITE, OR GLUCOSE <1000 mg/dL.  DIFFERENTIAL     Status:  Abnormal   Collection Time   05/28/11  2:58 PM      Component Value Range Comment   Neutrophils Relative 59  43 - 77 (%)    Neutro Abs 6.1  1.7 - 7.7 (K/uL)    Lymphocytes Relative 24  12 - 46 (%)    Lymphs Abs 2.5  0.7 - 4.0 (K/uL)    Monocytes Relative 14 (*) 3 - 12 (%)    Monocytes Absolute 1.4 (*) 0.1 - 1.0 (K/uL)    Eosinophils Relative 2  0 - 5 (%)    Eosinophils Absolute 0.2  0.0 - 0.7 (K/uL)    Basophils Relative 1  0 - 1 (%)    Basophils Absolute 0.1  0.0 - 0.1 (K/uL)   CBC     Status: Abnormal   Collection Time   05/28/11  2:58 PM      Component Value Range Comment   WBC 10.3  4.0 - 10.5 (K/uL)    RBC 5.35  4.22 - 5.81 (MIL/uL)    Hemoglobin 17.5 (*) 13.0 - 17.0 (g/dL)    HCT 49.3  39.0 - 52.0 (%)    MCV 92.1  78.0 - 100.0 (fL)    MCH 32.7  26.0 - 34.0 (pg)    MCHC 35.5  30.0 - 36.0 (g/dL)    RDW 14.0  11.5 - 15.5 (%)    Platelets 227  150 - 400 (K/uL)   LIPASE, BLOOD     Status: Abnormal   Collection Time   05/28/11  2:58 PM      Component Value Range Comment   Lipase 275 (*) 11 - 59 (U/L)   BASIC METABOLIC PANEL     Status: Abnormal   Collection Time   05/28/11  2:58 PM      Component Value Range Comment   Sodium 138  135 - 145 (mEq/L)    Potassium 5.0  3.5 - 5.1 (mEq/L)    Chloride 102  96 - 112 (mEq/L)    CO2 27  19 - 32 (mEq/L)    Glucose, Bld 131 (*) 70 - 99 (mg/dL)    BUN 11  6 - 23 (mg/dL)    Creatinine, Ser 0.74  0.50 - 1.35 (mg/dL)    Calcium 9.9  8.4 - 10.5 (mg/dL)    GFR calc non Af Amer >90  >90 (mL/min)    GFR calc Af Amer >90  >90 (mL/min)   TROPONIN I     Status: Normal   Collection Time   05/28/11  3:03 PM      Component Value Range Comment   Troponin I <0.30  <0.30 (ng/mL)    Dg Chest 2 View  05/28/2011   IMPRESSION: Chronic lung disease with mildly progressive biapical scarring.  No acute cardiopulmonary process.   ROS  Per HPI  Vitals: Temp. 98.1 BP 116/64 HR 85 RR 16  Physical Exam  Constitutional: He appears well-nourished. No  distress.       Overweight  HENT:  Head: Normocephalic and atraumatic.  Eyes: EOM are normal. Pupils are equal, round, and reactive to light.  Neck: Normal range of motion. Neck supple. No JVD present.  Cardiovascular: Normal rate and regular rhythm.  Exam reveals no gallop and no friction rub.   No murmur heard. Respiratory: Effort normal and breath sounds normal. He has no wheezes. He has no rales. He exhibits no tenderness.  GI: Bowel sounds are normal. There is no rebound and no guarding.         Tenderness on palpation mid-epigastric area  Musculoskeletal: Normal range of motion. He exhibits no edema and no tenderness.  Lymphadenopathy:    He has no cervical adenopathy.  Neurological: He is alert.  Skin: Skin is warm and dry. No rash noted. No erythema.     Assessment/Plan 60 year old male with history of chronic pancreatitis secondary to alcohol abuse presents with chest and abdominal pain, found to have elevated lipase.    1. Pancreatitis:  Acute on chronic pancreatitis likely triggered by alcohol consumption.  Lipase is elevated at 275 and patient reports pain similar to prior pancreatitis episodes.  Will start supportive treatment with bowel rest, IVF, and pain control.  No need for NG placement at this time.  Will likely start clear liquids tonight and advance diet as tolerated.  For pain, will give dilaudid 0.5-1mg  IV q3hr prn for pain.  Will also give a GI cocktail once now and start Protonix 40 mg IV daily - for possible GERD component.  Will repeat am labs and lipase tomorrow.  2.  Chest pain: first set of troponin negative and normal EKG.  Will order cardiac enzymes x 2.  Risk stratification: fasting lipid panel, A1c, TSH.  3. Alcohol use: will start CIWA protocol and write for social work for alcohol cessation   4. Tobacco abuse: will order Nicotine patch per pharm and social work consult for smoking cessation   5. FEN: NPO, 1/2 NS at 125cc/hr   6. PPx: heparin 5000  units SQ TID  7. Dispo: pending clinical improvement   DE LA CRUZ,Madelyne Millikan 05/28/2011, 5:10 PM

## 2011-05-28 NOTE — H&P (Signed)
Timothy Martinez is an 60 y.o. male.   Chief Complaint: CP HPI: Timothy Martinez is a 60 year old male with extensive alcohol history and history of pancreatitis who presented to the ED with chest pain.  He was in his usual state of health until this morning.  He started having some sub-sternal chest pain after his morning coffee that he described as "tight."  He ate some food and walked around which made the pain worse; sitting still mildly improve the pain.  With the increased pain, he called 911 and was transported to the ED.  On the way he received SL nitro x3, and when he arrived at the ED the CP had started to move more epigastrically.  He states that this pain is similar to prior episodes of pancreatitis with the exception of the chest pain.  There was no radiation of the pain, arm pain, dyspnea, or diaphoresis associated with the chest pain.  Denies f/c/n/v.  In the ED lab work, including troponin and lipase, was drawn as well as an ECG and CXR.  Troponins and ECG were normal.  Past Medical History 1. Pancreatitis: thought to be due to alcohol use, first diagnosed in 2001-2002, last episode in 2010.  Past Surgical History 1. Splenectomy secondary to trauma in 2003 2. Back surgery for "slipped disk" in 2009  Family History: DM in mom and sister.  Sister with lung cancer.  Social History: 2ppd x43 years; used to drink liquor, after first episode of pancreatitis switched to beer, 8/day; no drug use.  Lives on his own.  On disability after a slipped disk and back surgery.  Used to be a Administrator.  Allergies: NKDA  Medications: occasional OTC laxative for bloating   Results for orders placed during the hospital encounter of 05/28/11 (from the past 48 hour(s))  URINALYSIS, ROUTINE W REFLEX MICROSCOPIC     Status: Abnormal   Collection Time   05/28/11  2:20 PM      Component Value Range Comment   Color, Urine YELLOW  YELLOW     Appearance CLOUDY (*) CLEAR     Specific Gravity, Urine 1.019   1.005 - 1.030     pH 5.5  5.0 - 8.0     Glucose, UA 250 (*) NEGATIVE (mg/dL)    Hgb urine dipstick NEGATIVE  NEGATIVE     Bilirubin Urine NEGATIVE  NEGATIVE     Ketones, ur NEGATIVE  NEGATIVE (mg/dL)    Protein, ur NEGATIVE  NEGATIVE (mg/dL)    Urobilinogen, UA 0.2  0.0 - 1.0 (mg/dL)    Nitrite NEGATIVE  NEGATIVE     Leukocytes, UA NEGATIVE  NEGATIVE  MICROSCOPIC NOT DONE ON URINES WITH NEGATIVE PROTEIN, BLOOD, LEUKOCYTES, NITRITE, OR GLUCOSE <1000 mg/dL.  DIFFERENTIAL     Status: Abnormal   Collection Time   05/28/11  2:58 PM      Component Value Range Comment   Neutrophils Relative 59  43 - 77 (%)    Neutro Abs 6.1  1.7 - 7.7 (K/uL)    Lymphocytes Relative 24  12 - 46 (%)    Lymphs Abs 2.5  0.7 - 4.0 (K/uL)    Monocytes Relative 14 (*) 3 - 12 (%)    Monocytes Absolute 1.4 (*) 0.1 - 1.0 (K/uL)    Eosinophils Relative 2  0 - 5 (%)    Eosinophils Absolute 0.2  0.0 - 0.7 (K/uL)    Basophils Relative 1  0 - 1 (%)  Basophils Absolute 0.1  0.0 - 0.1 (K/uL)   CBC     Status: Abnormal   Collection Time   05/28/11  2:58 PM      Component Value Range Comment   WBC 10.3  4.0 - 10.5 (K/uL)    RBC 5.35  4.22 - 5.81 (MIL/uL)    Hemoglobin 17.5 (*) 13.0 - 17.0 (g/dL)    HCT 49.3  39.0 - 52.0 (%)    MCV 92.1  78.0 - 100.0 (fL)    MCH 32.7  26.0 - 34.0 (pg)    MCHC 35.5  30.0 - 36.0 (g/dL)    RDW 14.0  11.5 - 15.5 (%)    Platelets 227  150 - 400 (K/uL)   LIPASE, BLOOD     Status: Abnormal   Collection Time   05/28/11  2:58 PM      Component Value Range Comment   Lipase 275 (*) 11 - 59 (U/L)   BASIC METABOLIC PANEL     Status: Abnormal   Collection Time   05/28/11  2:58 PM      Component Value Range Comment   Sodium 138  135 - 145 (mEq/L)    Potassium 5.0  3.5 - 5.1 (mEq/L)    Chloride 102  96 - 112 (mEq/L)    CO2 27  19 - 32 (mEq/L)    Glucose, Bld 131 (*) 70 - 99 (mg/dL)    BUN 11  6 - 23 (mg/dL)    Creatinine, Ser 0.74  0.50 - 1.35 (mg/dL)    Calcium 9.9  8.4 - 10.5 (mg/dL)     GFR calc non Af Amer >90  >90 (mL/min)    GFR calc Af Amer >90  >90 (mL/min)   TROPONIN I     Status: Normal   Collection Time   05/28/11  3:03 PM      Component Value Range Comment   Troponin I <0.30  <0.30 (ng/mL)    Dg Chest 2 View IMPRESSION: Chronic lung disease with mildly progressive biapical scarring.  No acute cardiopulmonary process.  Original Report Authenticated By: Vivia Ewing, M.D.    ROS See HPI, otherwise negative  Vitals: T 98.1 P 85 R 16 BP 116/64 SaO2 94% on RA  Physical Exam  Constitutional: He is oriented to person, place, and time. He appears well-developed and well-nourished. No distress.  HENT:  Head: Normocephalic and atraumatic.  Mouth/Throat: Oropharynx is clear and moist. No oropharyngeal exudate.  Eyes: Pupils are equal, round, and reactive to light. No scleral icterus.  Neck: No thyromegaly present.  Cardiovascular: Normal rate, regular rhythm, normal heart sounds and intact distal pulses.  Exam reveals no gallop.   No murmur heard. Respiratory: Effort normal. He has no wheezes. He has no rales.       Distant breath sounds.  GI: Soft. Bowel sounds are normal. He exhibits no distension. There is tenderness (epigastric tenderness). There is no guarding.  Musculoskeletal: He exhibits no edema.  Lymphadenopathy:    He has no cervical adenopathy.  Neurological: He is alert and oriented to person, place, and time.  Skin: Skin is warm and dry. No rash noted. He is not diaphoretic.     Assessment/Plan 60 year old male with extensive alcohol intake and history of pancreatitis presenting with pancreatitis.  1. Pancreatitis: Lipase is elevated at 275 and patient reports pain similar to prior pancreatitis episodes.  Denies any nausea or vomiting, so no need to place NG tube.  Will make  NPO for now; can try clears for dinner.  Will give dilaudid 0.5-1mg  IV q3hr prn for pain.  Will also give a GI cocktail in the ED.  Given component of chest pain, will  cycle cardiac enzymes overnight and write for risk stratification labs.  2. Alcohol use: will start CIWA protocol and write for social work for alcohol cessation  3. Tobacco abuse: will write for a nicotine patch and social work consult for smoking cessation  FEN: NPO, 1/2 NS at 125cc/hr PPx: heparin 5000 units SQ TID, protonix 40mg  IV daily Dispo: pending tolerating PO and pain control; will set up with [place] at discharge  BOOTH, [place] 05/28/2011, 5:00 PM  Attending Admit note I have seen and examined this patient. I have discussed with Dr Darrick Grinder.  I agree with their findings and plans as documented in their admit note for today. Acute issues 1. Alcoholic pancreatitis exacerbation: Still with pain today, but improved.  Will advance diet, transition to oral analgesics in next day.  Continue IV dilaudid for now. 2. Alcohol Dependence: CIWA protocol.  Consult SW for recovery resources in the community.  Pt realizes that his pancreatitis is related to his alcohol abuse. Khristina Janota D

## 2011-05-29 DIAGNOSIS — K859 Acute pancreatitis without necrosis or infection, unspecified: Secondary | ICD-10-CM

## 2011-05-29 DIAGNOSIS — E860 Dehydration: Secondary | ICD-10-CM

## 2011-05-29 LAB — LIPASE, BLOOD: Lipase: 100 U/L — ABNORMAL HIGH (ref 11–59)

## 2011-05-29 LAB — LIPID PANEL
Cholesterol: 202 mg/dL — ABNORMAL HIGH (ref 0–200)
HDL: 33 mg/dL — ABNORMAL LOW (ref >39)
LDL Cholesterol: 133 mg/dL — ABNORMAL HIGH (ref 0–99)
Total CHOL/HDL Ratio: 6.1 RATIO
Triglycerides: 178 mg/dL — ABNORMAL HIGH (ref <150)
VLDL: 36 mg/dL (ref 0–40)

## 2011-05-29 LAB — CBC
HCT: 46.6 % (ref 39.0–52.0)
Hemoglobin: 15.8 g/dL (ref 13.0–17.0)
MCH: 31.9 pg (ref 26.0–34.0)
MCHC: 33.9 g/dL (ref 30.0–36.0)
MCV: 94.1 fL (ref 78.0–100.0)
Platelets: 225 10*3/uL (ref 150–400)
RBC: 4.95 MIL/uL (ref 4.22–5.81)
RDW: 14.4 % (ref 11.5–15.5)
WBC: 9.9 10*3/uL (ref 4.0–10.5)

## 2011-05-29 LAB — CARDIAC PANEL(CRET KIN+CKTOT+MB+TROPI)
CK, MB: 2.3 ng/mL (ref 0.3–4.0)
CK, MB: 2 ng/mL (ref 0.3–4.0)
Relative Index: INVALID (ref 0.0–2.5)
Relative Index: INVALID (ref 0.0–2.5)
Total CK: 81 U/L (ref 7–232)
Total CK: 85 U/L (ref 7–232)
Troponin I: <0.3 ng/mL (ref <0.30)
Troponin I: <0.3 ng/mL (ref <0.30)

## 2011-05-29 LAB — URINE CULTURE
Colony Count: NO GROWTH
Culture  Setup Time: 201211032233
Culture: NO GROWTH

## 2011-05-29 LAB — HEMOGLOBIN A1C
Hgb A1c MFr Bld: 6.8 % — ABNORMAL HIGH (ref <5.7)
Mean Plasma Glucose: 148 mg/dL — ABNORMAL HIGH (ref <117)

## 2011-05-29 LAB — TSH: TSH: 3.21 u[IU]/mL (ref 0.350–4.500)

## 2011-05-29 MED ORDER — TRAZODONE HCL 50 MG PO TABS
50.0000 mg | ORAL_TABLET | Freq: Every evening | ORAL | Status: DC | PRN
  Administered 2011-05-29: 50 mg via ORAL
  Filled 2011-05-29: qty 1

## 2011-05-29 NOTE — Progress Notes (Signed)
**Note Timothy-Identified via Obfuscation** Subjective: No acute events overnight. Patient still requiring Dilaudid to relieve midepigastric pain. Denies any radiation to his back. Patient either to start clear liquid diet today and advance to solids.  Objective: Vital signs in last 24 hours: Temp:  [97.7 F (36.5 C)-97.9 F (36.6 C)] 97.9 F (36.6 C) (11/04 0459) Pulse Rate:  [68-71] 68  (11/04 0459) Resp:  [18] 18  (11/04 0459) BP: (117-134)/(72-82) 117/72 mmHg (11/04 0459) SpO2:  [91 %-94 %] 91 % (11/04 0459) Weight:  [210 lb 1.6 oz (95.3 kg)-217 lb (98.43 kg)] 210 lb 1.6 oz (95.3 kg) (11/04 0300) Weight change:  Last BM Date: 05/28/11  Intake/Output from previous day: 11/03 0701 - 11/04 0700 In: 960 [P.O.:960] Out: 425 [Urine:425] Intake/Output this shift:   Physical exam: General appearance: alert, cooperative and no distress Resp: coarse breath sounds Cardio: regular rate and rhythm, S1, S2 normal, no murmur, click, rub or gallop GI: Tenderness on palpation of midepigastric area, active bowel sounds, mild distention Extremities: extremities normal, atraumatic, no cyanosis or edema Skin: Skin color, texture, turgor normal. No rashes or lesions  Lab Results:  [facility] 05/29/11 0645 05/28/11 1458  WBC 9.9 10.3  HGB 15.8 17.5*  HCT 46.6 49.3  PLT 225 227   BMET  Basename 05/28/11 1458  NA 138  K 5.0  CL 102  CO2 27  GLUCOSE 131*  BUN 11  CREATININE 0.74  CALCIUM 9.9    Studies/Results: Dg Chest 2 View  05/28/2011  *RADIOLOGY REPORT*  Clinical Data: Mid chest pain today.  History of pancreatitis.  CHEST - 2 VIEW  Comparison: The 09/2006 chest radiographs.  Findings: There is chronic lung disease with interstitial prominence and mildly progressive biapical scarring.  No confluent airspace opacity, edema or pleural effusion is identified.  Heart size and mediastinal contours are stable.  IMPRESSION: Chronic lung disease with mildly progressive biapical scarring.  No acute cardiopulmonary process.  Original  Report Authenticated By: Vivia Ewing, M.D.    Medications:  Continuous:   . sodium chloride 125 mL/hr at 05/29/11 1007   [id number] (DILAUDID) injection, nicotine  Assessment/Plan:  60 year old male with history of chronic pancreatitis secondary to alcohol abuse presents with chest and abdominal pain, found to have elevated lipase.   1. Pancreatitis: Acute on chronic pancreatitis likely triggered by alcohol consumption. Lipase is down from 275 to 100.  Will continue supportive treatment with bowel rest, IVF, and pain control.  Will likely start clear liquids tonight and advance diet as tolerated. For pain, will continue dilaudid 0.5-1mg  IV q3hr prn for pain and start Protonix 40 mg IV daily - for possible GERD component. Will repeat am labs. 2. Chest pain: Troponin negative x 3.  Follow up Risk stratification: fasting lipid panel, A1c, TSH.  3. Alcohol use: will continue CIWA protocol and write for social work for alcohol cessation  4. Tobacco abuse: Nicotine patch and social work consult for smoking cessation  5. FEN: clear liquid and advance as tolerated, 1/2 NS at 125cc/hr, will likely KVO later today 6. PPx: heparin 5000 units SQ TID  7. Dispo: pending clinical improvement, likely discharge early this week    LOS: 1 day   Timothy Martinez,[name] 05/29/2011, 10:28 AM

## 2011-05-29 NOTE — H&P (Signed)
Timothy Martinez is an 60 y.o. male.    Chief Complaint: chest/mid-epigastric pain, pancreatitis  HPI: Briefly, this is a 60 year old male who presents to [facility] with chest pain.  Patient does not have a PCP.  Pain started this morning after drinking 3 cups of coffee and eating a sandwich.  Pain was located at mid-sternum and did not radiate.  Pain was worse with walking around his house, so he called EMS.  Chest pain was not associated with SOB, nausea/vomiting, diaphoresis.  In ambulance, chest pain resolved after nitro x 3 doses.  Upon arrival to ED, patient developed mid-epigastric pain.  He has a history of pancreatitis and patient says this is his typical presentation.  He drinks about 8 beers/day and last drank yesterday around 9 pm.  His has been hospitalized in the past for pancreatitis - last admission was 2 years ago.  Pain has come down from a 10 to 5 after Dilaudid.  EKG showed NSR, no ST elevation.  Cardiac enzymes negative x 1.  Lipase 275.  We were called to admit patient for pancreatitis.  PMH, FH, and SH: please see excellent PGY-1 H&P  Allergies: NKDA  Medications: None  Results for orders placed during the hospital encounter of 05/28/11 (from the past 48 hour(s))  URINALYSIS, ROUTINE W REFLEX MICROSCOPIC     Status: Abnormal   Collection Time   05/28/11  2:20 PM      Component Value Range Comment   Color, Urine YELLOW  YELLOW     Appearance CLOUDY (*) CLEAR     Specific Gravity, Urine 1.019  1.005 - 1.030     pH 5.5  5.0 - 8.0     Glucose, UA 250 (*) NEGATIVE (mg/dL)    Hgb urine dipstick NEGATIVE  NEGATIVE     Bilirubin Urine NEGATIVE  NEGATIVE     Ketones, ur NEGATIVE  NEGATIVE (mg/dL)    Protein, ur NEGATIVE  NEGATIVE (mg/dL)    Urobilinogen, UA 0.2  0.0 - 1.0 (mg/dL)    Nitrite NEGATIVE  NEGATIVE     Leukocytes, UA NEGATIVE  NEGATIVE  MICROSCOPIC NOT DONE ON URINES WITH NEGATIVE PROTEIN, BLOOD, LEUKOCYTES, NITRITE, OR GLUCOSE <1000 mg/dL.  DIFFERENTIAL     Status:  Abnormal   Collection Time   05/28/11  2:58 PM      Component Value Range Comment   Neutrophils Relative 59  43 - 77 (%)    Neutro Abs 6.1  1.7 - 7.7 (K/uL)    Lymphocytes Relative 24  12 - 46 (%)    Lymphs Abs 2.5  0.7 - 4.0 (K/uL)    Monocytes Relative 14 (*) 3 - 12 (%)    Monocytes Absolute 1.4 (*) 0.1 - 1.0 (K/uL)    Eosinophils Relative 2  0 - 5 (%)    Eosinophils Absolute 0.2  0.0 - 0.7 (K/uL)    Basophils Relative 1  0 - 1 (%)    Basophils Absolute 0.1  0.0 - 0.1 (K/uL)   CBC     Status: Abnormal   Collection Time   05/28/11  2:58 PM      Component Value Range Comment   WBC 10.3  4.0 - 10.5 (K/uL)    RBC 5.35  4.22 - 5.81 (MIL/uL)    Hemoglobin 17.5 (*) 13.0 - 17.0 (g/dL)    HCT 49.3  39.0 - 52.0 (%)    MCV 92.1  78.0 - 100.0 (fL)    MCH 32.7  26.0 - 34.0 (pg)    MCHC 35.5  30.0 - 36.0 (g/dL)    RDW 14.0  11.5 - 15.5 (%)    Platelets 227  150 - 400 (K/uL)   LIPASE, BLOOD     Status: Abnormal   Collection Time   05/28/11  2:58 PM      Component Value Range Comment   Lipase 275 (*) 11 - 59 (U/L)   BASIC METABOLIC PANEL     Status: Abnormal   Collection Time   05/28/11  2:58 PM      Component Value Range Comment   Sodium 138  135 - 145 (mEq/L)    Potassium 5.0  3.5 - 5.1 (mEq/L)    Chloride 102  96 - 112 (mEq/L)    CO2 27  19 - 32 (mEq/L)    Glucose, Bld 131 (*) 70 - 99 (mg/dL)    BUN 11  6 - 23 (mg/dL)    Creatinine, Ser 0.74  0.50 - 1.35 (mg/dL)    Calcium 9.9  8.4 - 10.5 (mg/dL)    GFR calc non Af Amer >90  >90 (mL/min)    GFR calc Af Amer >90  >90 (mL/min)   TROPONIN I     Status: Normal   Collection Time   05/28/11  3:03 PM      Component Value Range Comment   Troponin I <0.30  <0.30 (ng/mL)   CARDIAC PANEL(CRET KIN+CKTOT+MB+TROPI)     Status: Normal   Collection Time   05/28/11  8:42 PM      Component Value Range Comment   Total CK 83  7 - 232 (U/L)    CK, MB 2.2  0.3 - 4.0 (ng/mL)    Troponin I <0.30  <0.30 (ng/mL)    Relative Index RELATIVE INDEX IS  INVALID  0.0 - 2.5    CARDIAC PANEL(CRET KIN+CKTOT+MB+TROPI)     Status: Normal   Collection Time   05/29/11  3:00 AM      Component Value Range Comment   Total CK 81  7 - 232 (U/L)    CK, MB 2.3  0.3 - 4.0 (ng/mL)    Troponin I <0.30  <0.30 (ng/mL)    Relative Index RELATIVE INDEX IS INVALID  0.0 - 2.5    CBC     Status: Normal   Collection Time   05/29/11  6:45 AM      Component Value Range Comment   WBC 9.9  4.0 - 10.5 (K/uL)    RBC 4.95  4.22 - 5.81 (MIL/uL)    Hemoglobin 15.8  13.0 - 17.0 (g/dL)    HCT 46.6  39.0 - 52.0 (%)    MCV 94.1  78.0 - 100.0 (fL)    MCH 31.9  26.0 - 34.0 (pg)    MCHC 33.9  30.0 - 36.0 (g/dL)    RDW 14.4  11.5 - 15.5 (%)    Platelets 225  150 - 400 (K/uL)   LIPASE, BLOOD     Status: Abnormal   Collection Time   05/29/11  6:45 AM      Component Value Range Comment   Lipase 100 (*) 11 - 59 (U/L)   LIPID PANEL     Status: Abnormal   Collection Time   05/29/11  6:45 AM      Component Value Range Comment   Cholesterol 202 (*) 0 - 200 (mg/dL)    Triglycerides 178 (*) <150 (mg/dL)    HDL 33 (*) >39 (  mg/dL)    Total CHOL/HDL Ratio 6.1      VLDL 36  0 - 40 (mg/dL)    LDL Cholesterol 133 (*) 0 - 99 (mg/dL)    Dg Chest 2 View  05/28/2011   IMPRESSION: Chronic lung disease with mildly progressive biapical scarring.  No acute cardiopulmonary process.   ROS   Per HPI  Vitals: Temp. 98.1 BP 116/64 HR 85 RR 16  Physical Exam  Constitutional: He appears well-nourished. No distress.       Overweight  HENT:  Head: Normocephalic and atraumatic.  Eyes: EOM are normal. Pupils are equal, round, and reactive to light.  Neck: Normal range of motion. Neck supple. No JVD present.  Cardiovascular: Normal rate and regular rhythm.  Exam reveals no gallop and no friction rub.   No murmur heard. Respiratory: Effort normal and breath sounds normal. He has no wheezes. He has no rales. He exhibits no tenderness.  GI: Bowel sounds are normal. There is no rebound and no  guarding.         Tenderness on palpation mid-epigastric area  Musculoskeletal: Normal range of motion. He exhibits no edema and no tenderness.  Lymphadenopathy:    He has no cervical adenopathy.  Neurological: He is alert.  Skin: Skin is warm and dry. No rash noted. No erythema.     Assessment/Plan 60 year old male with history of chronic pancreatitis secondary to alcohol abuse presents with chest and abdominal pain, found to have elevated lipase.    1. Pancreatitis:  Acute on chronic pancreatitis likely triggered by alcohol consumption.  Lipase is elevated at 275 and patient reports pain similar to prior pancreatitis episodes.  Will start supportive treatment with bowel rest, IVF, and pain control.  No need for NG placement at this time.  Will likely start clear liquids tonight and advance diet as tolerated.  For pain, will give dilaudid 0.5-1mg  IV q3hr prn for pain.  Will also give a GI cocktail once now and start Protonix 40 mg IV daily - for possible GERD component.  Will repeat am labs and lipase tomorrow.  2.  Chest pain: first set of troponin negative and normal EKG.  Will order cardiac enzymes x 2.  Risk stratification: fasting lipid panel, A1c, TSH.    3. Alcohol use: will start CIWA protocol and write for social work for alcohol cessation   4. Tobacco abuse: will order Nicotine patch per pharm and social work consult for smoking cessation   5. FEN: NPO, 1/2 NS at 125cc/hr   6. PPx: heparin 5000 units SQ TID  7. Dispo: pending clinical improvement   Attending Admit note I have seen and examined this patient. I have discussed with Dr Karlene Einstein de la Maryjean Morn.  I agree with their findings and plans as documented in their admit note for today.  Malie Kashani D 05/29/2011, 8:58 AM

## 2011-05-30 DIAGNOSIS — Z720 Tobacco use: Secondary | ICD-10-CM

## 2011-05-30 DIAGNOSIS — K860 Alcohol-induced chronic pancreatitis: Secondary | ICD-10-CM | POA: Diagnosis present

## 2011-05-30 DIAGNOSIS — F1010 Alcohol abuse, uncomplicated: Secondary | ICD-10-CM | POA: Diagnosis present

## 2011-05-30 DIAGNOSIS — E785 Hyperlipidemia, unspecified: Secondary | ICD-10-CM | POA: Diagnosis present

## 2011-05-30 HISTORY — DX: Tobacco use: Z72.0

## 2011-05-30 LAB — BASIC METABOLIC PANEL
BUN: 9 mg/dL (ref 6–23)
CO2: 25 mEq/L (ref 19–32)
Calcium: 9.6 mg/dL (ref 8.4–10.5)
Chloride: 101 mEq/L (ref 96–112)
Creatinine, Ser: 0.81 mg/dL (ref 0.50–1.35)
GFR calc Af Amer: >90 mL/min (ref >90)
GFR calc non Af Amer: >90 mL/min (ref >90)
Glucose, Bld: 110 mg/dL — ABNORMAL HIGH (ref 70–99)
Potassium: 4.3 mEq/L (ref 3.5–5.1)
Sodium: 137 mEq/L (ref 135–145)

## 2011-05-30 LAB — CBC
HCT: 48.5 % (ref 39.0–52.0)
Hemoglobin: 16.4 g/dL (ref 13.0–17.0)
MCH: 31.7 pg (ref 26.0–34.0)
MCHC: 33.8 g/dL (ref 30.0–36.0)
MCV: 93.8 fL (ref 78.0–100.0)
Platelets: 241 10*3/uL (ref 150–400)
RBC: 5.17 MIL/uL (ref 4.22–5.81)
RDW: 14 % (ref 11.5–15.5)
WBC: 8.5 10*3/uL (ref 4.0–10.5)

## 2011-05-30 MED ORDER — HYDROMORPHONE HCL PF 1 MG/ML IJ SOLN: 0.5000 mg | INTRAMUSCULAR | Status: DC | PRN

## 2011-05-30 MED ORDER — NICOTINE 7 MG/24HR TD PT24: 1.0000 | MEDICATED_PATCH | Freq: Every day | TRANSDERMAL | Status: DC | PRN

## 2011-05-30 MED ORDER — OXYCODONE HCL 5 MG PO TABS
5.0000 mg | ORAL_TABLET | Freq: Four times a day (QID) | ORAL | Status: DC
  Administered 2011-05-30: 5 mg via ORAL
  Filled 2011-05-30: qty 1

## 2011-05-30 MED ORDER — OXYCODONE HCL 5 MG PO TABS: 5.0000 mg | ORAL_TABLET | ORAL | Status: DC | PRN

## 2011-05-30 MED ORDER — SIMVASTATIN 20 MG PO TABS: 20.0000 mg | ORAL_TABLET | Freq: Every day | ORAL | Status: DC

## 2011-05-30 NOTE — Progress Notes (Signed)
Initial UR complete.

## 2011-05-30 NOTE — Progress Notes (Signed)
CSW completed SBIRT.

## 2011-05-30 NOTE — Discharge Summary (Signed)
Physician Discharge Summary  Patient ID: Timothy Martinez [id number] 11-04-1950 60 y.o.  Admit date: 05/28/2011 Discharge date: 05/30/2011  PCP: No primary provider on file. Patient plans to follow up with [facility]   Discharge Diagnosis: Primary 1. Acute on Chronic alcoholic pancreatitis 2. Chest Pain, ACS rule out. Pain typical of pancreatitis for patient.  3. New onset Diabetes Mellitus Type II 4. New onset Hyperlipidemia Secondary 1. Alcohol Abuse 2. Tobacco Abuse.    Hospital Course 60 year old male with history of chronic pancreatitis secondary to alcohol abuse presents with chest and abdominal pain, found to have elevated lipase concerning for acute on chronic pancreatitis.  1. Pancreatitis: Acute on chronic pancreatitis likely triggered by alcohol consumption. Lipase trended down from 275 to 100. Gave supportive treatment with bowel rest with clears, IVF, and pain control with prn dilauidid. Patient transitioned to oxycodone 5mg  q6 hours scheduled (no percocet due to LFTs) and this improved pain better than dilaudid. Patient requested to go home on final day. Discharged with 25 oxycodone 5mg  for q4hour use. Also advised on diet with small frequent meals, soft consistency, and low fat. Encouraged alchol and smoking cessation.  2. Chest pain: Troponin negative x 3. EKG without changes.  Risk stratification labs, new onset diabetes and hyperlipemia. Patient also a smoker. No aspirin use. TIMI 1 or possibly 2 but angina related to pancreatic pain.  -consider outpatient stress in view of diabetes, hyperlipidemia  Lab Results   Component  Value  Date    HGBA1C  6.8*  05/29/2011    Lab Results   Component  Value  Date    CHOL  202*  05/29/2011    HDL  33*  05/29/2011    LDLCALC  133*  05/29/2011    TRIG  178*  05/29/2011    CHOLHDL  6.1  05/29/2011   TSH WNL  3. New Diabetes II-patient will receive instructions for lifestyle modifications with plan for close outpatient  follow up. Patient says he is committed to walking and improving diet.  4. Hyperlipidemia-see #3. Plan for statin with watchful waiting for improvement in diabetes.  5. Alcohol use: CIWA protocol but patient did not require ambien. CSW consult for alcohol cessation with resources provided.  6. Tobacco abuse: Nicotine patch and social work consult for smoking cessation. Gave rx for nicotine patch.  7. FEN: clear liquid diet in house. Instructed to continue on diet at home or advance to mechanical soft    Procedures/Imaging:  Dg Chest 2 View  05/28/2011  *RADIOLOGY REPORT*  Clinical Data: Mid chest pain today.  History of pancreatitis.  CHEST - 2 VIEW  Comparison: The 09/2006 chest radiographs.  Findings: There is chronic lung disease with interstitial prominence and mildly progressive biapical scarring.  No confluent airspace opacity, edema or pleural effusion is identified.  Heart size and mediastinal contours are stable.  IMPRESSION: Chronic lung disease with mildly progressive biapical scarring.  No acute cardiopulmonary process.  Original Report Authenticated By: Vivia Ewing, M.D.   Previous ultrasound earlier this year without gallstones.   Labs  CBC  Lab 05/30/11 0750 05/29/11 0645 05/28/11 1458  WBC 8.5 9.9 10.3  HGB 16.4 15.8 17.5*  HCT 48.5 46.6 49.3  PLT 241 225 227   BMET  Lab 05/30/11 0750 05/28/11 1458  NA 137 138  K 4.3 5.0  CL 101 102  CO2 25 27  BUN 9 11  CREATININE 0.81 0.74  CALCIUM 9.6 9.9  PROT -- --  BILITOT -- --  ALKPHOS -- --  ALT -- --  AST -- --  GLUCOSE 110* 131*   Lab Results  Component Value Date   LIPASE 100* down from 275.  05/29/2011        Patient condition at time of discharge/disposition: stable  Disposition-home   Follow up issues: 1. Discuss value of outpatient stress in view of diabetes, hyperlipidemia but with knowledge that pain was typical of pancreatitis for patient. Would lean toward not doing stress test.  2.  Hyperlipidemia+ DM II. Pravastatin 40 discussed with pharmacy as likely best option. Will likely start at this time with consideration of stopping if patient able to control.  Patient will received instructions for lifestyle modifications with plan for close outpatient follow up. Patient says he is committed to walking and improving diet.  See patient instructions below.  3. Would also benefit from primary prevention with aspirin 81mg .  4. Follow up on tobacco abuse and alcohol use and see if patient using resources.  5. Follow up diet for pancreatitis and advance as tolerated.   Patient instructions:  Timothy Martinez, we discovered that you have high cholesterol and diabetes when you were hospitalized with [place] for your pancreas problems. These can often be managed by lifestyle changes.  I would like for you to do the following: 1. Walk 15 minutes per day. Stop any activity that causes you chest pain, shortness of breath, dizziness, lightheadedness. Try to work your way up to 30 minutes everyday.  2. Try to eat at least 5 servings combined of fruits and vegetables each day after you are feeling better from this current illness.  3. Follow up with [place] so we can decide when to retest these labs (likely 3 months) to see how your efforts are helping.  4. We will discuss possibly starting a medicine for your high cholesterol at your next visit.  As always, quitting smoking and reducing alcohol intake can help your health very much.   Discharge follow up:  Discharge Orders    Future Appointments: Provider: Department: Dept Phone: Center:   06/08/2011 1:30 PM Timothy Reddish, MD [facility] [phone] [facility]     Future Orders Please Complete By Expires   Increase activity slowly      Driving Restrictions      Comments:   Do not drive while taking pain medication. If your pain is controlled by ibuprofen instead of the oxycodone, you may drive.   Other Restrictions      Comments:   Please drink and eat  only liquids and soft foods for the next few days. You can advance your diet in about 1 week if you are feeling better. Avoid fatty foods as this can trigger your pain to worsen.   Call MD for:  persistant nausea and vomiting      Call MD for:  severe uncontrolled pain         Discharge Medications  [place], [place]  Home Medication Instructions [id number]   Printed on:05/30/11 1407  Medication Information                    oxyCODONE (OXY IR/ROXICODONE) 5 MG immediate release tablet Take 1 tablet (5 mg total) by mouth every 4 (four) hours as needed for pain.           nicotine (NICODERM CQ - DOSED IN MG/24 HR) 7 mg/24hr patch Place 1 patch onto the skin daily as needed (for smoking cessation).  Timothy Reddish, MD of [facility] 05/30/2011 2:07 PM

## 2011-05-30 NOTE — Progress Notes (Signed)
Family Medicine Teaching Service, Intern Progress Note  Subjective: No acute events overnight. Patient attempted q6 dilaudid and had 9/10 Pain. 3/10 with dilaudid. Tolerating clears-->would like to advance diet but due to pain told will hold off. Slight nausea/no vomiting.   Patient states ready to turn his life around-quit drinking, smoking, start walking, eating more fruits and vegetables. Wants to follow up with [facility].    Objective: Vital signs in last 24 hours: Temp:  [97.6 F (36.4 C)-98.2 F (36.8 C)] 97.6 F (36.4 C) (11/05 0651) Pulse Rate:  [68-80] 68  (11/05 0651) Resp:  [18-20] 19  (11/05 0651) BP: (112-157)/(72-84) 112/72 mmHg (11/05 0651) SpO2:  [92 %-99 %] 92 % (11/05 0651) Weight:  [210 lb 1.6 oz (95.3 kg)] 210 lb 1.6 oz (95.3 kg) (11/05 0323) Weight change: -6 lb 14.4 oz (-3.13 kg) Last BM Date: 05/29/11  1 outlier blood pressure otherwise well controlled  Intake/Output from previous day: 11/04 0701 - 11/05 0700 In: 3445.4 [P.O.:960; I.V.:2485.4] Out: 650 [Urine:650] I/os inconsistent with weight. Patient stable    Physical exam: General appearance: alert, cooperative and no distress Resp: clear to auscultation bilaterally.  Cardio: regular rate and rhythm, S1, S2 normal, no murmur, click, rub or gallop GI: Moderate Tenderness on palpation of midepigastric area, active bowel sounds, mild distention Extremities: extremities normal, atraumatic, no cyanosis or edema Skin: Skin color, texture, turgor normal. No rashes or lesions  Lab Results:  [facility] 05/30/11 0750 05/29/11 0645  WBC 8.5 9.9  HGB 16.4 15.8  HCT 48.5 46.6  PLT 241 225   BMET  Basename 05/30/11 0750 05/28/11 1458  NA 137 138  K 4.3 5.0  CL 101 102  CO2 25 27  GLUCOSE 110* 131*  BUN 9 11  CREATININE 0.81 0.74  CALCIUM 9.6 9.9    Studies/Results: Dg Chest 2 View  05/28/2011  *RADIOLOGY REPORT*  Clinical Data: Mid chest pain today.  History of pancreatitis.   CHEST - 2 VIEW  Comparison: The 09/2006 chest radiographs.  Findings: There is chronic lung disease with interstitial prominence and mildly progressive biapical scarring.  No confluent airspace opacity, edema or pleural effusion is identified.  Heart size and mediastinal contours are stable.  IMPRESSION: Chronic lung disease with mildly progressive biapical scarring.  No acute cardiopulmonary process.  Original Report Authenticated By: Vivia Ewing, M.D.    Medications: Medications reviewed  Assessment/Plan:  60 year old male with history of chronic pancreatitis secondary to alcohol abuse presents with chest and abdominal pain, found to have elevated lipase.   1. Pancreatitis: Acute on chronic pancreatitis likely triggered by alcohol consumption. Lipase trended down from 275 to 100.  Will continue supportive treatment with bowel rest with clears, IVF, and pain control. On clear liquids. For pain, will start oxycodone 5mg  q6 hours scheduled (no percocet due to LFTs) and change dilaudid to breakthrough pain.  -patient requesting one more day to see if he can tolerate oral pain medicines.  -leave protonix on board for possible GERD component.  -labs stable-do not need to continue  2. Chest pain: Troponin negative x 3.  Risk stratification labs, new onset diabetes and hyperlipemia.  -consider outpatient stress in view of diabetes, hyperlipidemia Lab Results  Component Value Date   HGBA1C 6.8* 05/29/2011   Lab Results  Component Value Date   CHOL 202* 05/29/2011   HDL 33* 05/29/2011   LDLCALC 133* 05/29/2011   TRIG 178* 05/29/2011   CHOLHDL 6.1 05/29/2011  TSH WNL  3. New Diabetes II-patient will receive instructions for lifestyle modifications with plan for close outpatient follow up. Patient says he is committed to walking and improving diet.  4. Hyperlipidemia-see #3.  5. Alcohol use:  continue CIWA protocol and write for social work for alcohol cessation  6. Tobacco abuse: Nicotine  patch and social work consult for smoking cessation  7. FEN: clear liquid and advance to mechanical soft on 11/6, 1/2 NS KVO  8. PPx: heparin 5000 units SQ TID  9. Dispo: pending clinical improvement, likely d/c 11/6    LOS: 2 days   [name], [place] 05/30/2011, 7:56 AM

## 2011-05-30 NOTE — Progress Notes (Signed)
Seen and examined.  Agree with Dr. Ansel Bong management.

## 2011-05-30 NOTE — Progress Notes (Signed)
CSW completed psychosocial assessment which can be found in the shadow chart. CSW provided pt. With resources and CSW is signing off.

## 2011-05-31 NOTE — Discharge Summary (Signed)
Seen and examined on 11/5.  Agree with DC as outlined by Dr. Yong Channel.

## 2011-06-04 ENCOUNTER — Inpatient Hospital Stay (HOSPITAL_COMMUNITY)
Admission: EM | Admit: 2011-06-04 | Discharge: 2011-06-09 | DRG: 439 | Disposition: A | Discharge status: Home / Self Care | Payer: Medicare Other | Attending: Family Medicine | Admitting: Family Medicine

## 2011-06-04 ENCOUNTER — Emergency Department (HOSPITAL_COMMUNITY): Payer: Medicare Other

## 2011-06-04 ENCOUNTER — Telehealth: Payer: Self-pay | Admitting: Family Medicine

## 2011-06-04 DIAGNOSIS — E785 Hyperlipidemia, unspecified: Secondary | ICD-10-CM | POA: Diagnosis present

## 2011-06-04 DIAGNOSIS — F1010 Alcohol abuse, uncomplicated: Secondary | ICD-10-CM | POA: Diagnosis present

## 2011-06-04 DIAGNOSIS — D72829 Elevated white blood cell count, unspecified: Secondary | ICD-10-CM | POA: Diagnosis present

## 2011-06-04 DIAGNOSIS — K860 Alcohol-induced chronic pancreatitis: Secondary | ICD-10-CM | POA: Diagnosis present

## 2011-06-04 DIAGNOSIS — F1011 Alcohol abuse, in remission: Secondary | ICD-10-CM

## 2011-06-04 DIAGNOSIS — K5900 Constipation, unspecified: Secondary | ICD-10-CM | POA: Diagnosis present

## 2011-06-04 DIAGNOSIS — F17200 Nicotine dependence, unspecified, uncomplicated: Secondary | ICD-10-CM | POA: Diagnosis present

## 2011-06-04 DIAGNOSIS — E119 Type 2 diabetes mellitus without complications: Secondary | ICD-10-CM | POA: Diagnosis present

## 2011-06-04 DIAGNOSIS — K649 Unspecified hemorrhoids: Secondary | ICD-10-CM | POA: Diagnosis present

## 2011-06-04 DIAGNOSIS — T50995A Adverse effect of other drugs, medicaments and biological substances, initial encounter: Secondary | ICD-10-CM | POA: Diagnosis not present

## 2011-06-04 DIAGNOSIS — K625 Hemorrhage of anus and rectum: Secondary | ICD-10-CM

## 2011-06-04 DIAGNOSIS — K859 Acute pancreatitis without necrosis or infection, unspecified: Principal | ICD-10-CM | POA: Diagnosis present

## 2011-06-04 DIAGNOSIS — K861 Other chronic pancreatitis: Secondary | ICD-10-CM | POA: Diagnosis present

## 2011-06-04 DIAGNOSIS — Z720 Tobacco use: Secondary | ICD-10-CM | POA: Diagnosis present

## 2011-06-04 HISTORY — DX: Hyperlipidemia, unspecified: E78.5

## 2011-06-04 LAB — URINALYSIS, ROUTINE W REFLEX MICROSCOPIC
Glucose, UA: NEGATIVE mg/dL
Hgb urine dipstick: NEGATIVE
Ketones, ur: 15 mg/dL — AB
Nitrite: NEGATIVE
Protein, ur: NEGATIVE mg/dL
Specific Gravity, Urine: 1.026 (ref 1.005–1.030)
Urobilinogen, UA: 1 mg/dL (ref 0.0–1.0)
pH: 5.5 (ref 5.0–8.0)

## 2011-06-04 LAB — COMPREHENSIVE METABOLIC PANEL
ALT: 34 U/L (ref 0–53)
AST: 33 U/L (ref 0–37)
Albumin: 3.5 g/dL (ref 3.5–5.2)
Alkaline Phosphatase: 92 U/L (ref 39–117)
BUN: 11 mg/dL (ref 6–23)
CO2: 24 mEq/L (ref 19–32)
Calcium: 9.9 mg/dL (ref 8.4–10.5)
Chloride: 101 mEq/L (ref 96–112)
Creatinine, Ser: 0.83 mg/dL (ref 0.50–1.35)
GFR calc Af Amer: >90 mL/min (ref >90)
GFR calc non Af Amer: >90 mL/min (ref >90)
Glucose, Bld: 125 mg/dL — ABNORMAL HIGH (ref 70–99)
Potassium: 4.2 mEq/L (ref 3.5–5.1)
Sodium: 135 mEq/L (ref 135–145)
Total Bilirubin: 0.5 mg/dL (ref 0.3–1.2)
Total Protein: 7.8 g/dL (ref 6.0–8.3)

## 2011-06-04 LAB — URINE MICROSCOPIC-ADD ON

## 2011-06-04 LAB — POCT I-STAT TROPONIN I: Troponin i, poc: 0 ng/mL (ref 0.00–0.08)

## 2011-06-04 LAB — LIPASE, BLOOD: Lipase: 778 U/L — ABNORMAL HIGH (ref 11–59)

## 2011-06-04 LAB — CBC
HCT: 51.5 % (ref 39.0–52.0)
Hemoglobin: 17.9 g/dL — ABNORMAL HIGH (ref 13.0–17.0)
MCH: 32.3 pg (ref 26.0–34.0)
MCHC: 34.8 g/dL (ref 30.0–36.0)
MCV: 93 fL (ref 78.0–100.0)
Platelets: 243 10*3/uL (ref 150–400)
RBC: 5.54 MIL/uL (ref 4.22–5.81)
RDW: 13.8 % (ref 11.5–15.5)
WBC: 12.6 10*3/uL — ABNORMAL HIGH (ref 4.0–10.5)

## 2011-06-04 LAB — GLUCOSE, CAPILLARY: Glucose-Capillary: 101 mg/dL — ABNORMAL HIGH (ref 70–99)

## 2011-06-04 LAB — ETHANOL: Alcohol, Ethyl (B): <11 mg/dL (ref 0–11)

## 2011-06-04 MED ORDER — SODIUM CHLORIDE 0.9 % IV SOLN
Freq: Once | INTRAVENOUS | Status: AC
  Administered 2011-06-04: 20:00:00 via INTRAVENOUS

## 2011-06-04 MED ORDER — THERA M PLUS PO TABS
1.0000 | ORAL_TABLET | Freq: Every day | ORAL | Status: DC
  Administered 2011-06-05 – 2011-06-09 (×5): 1 via ORAL
  Filled 2011-06-04 (×6): qty 1

## 2011-06-04 MED ORDER — ONDANSETRON HCL 4 MG/2ML IJ SOLN
4.0000 mg | Freq: Once | INTRAMUSCULAR | Status: AC
  Administered 2011-06-04: 4 mg via INTRAVENOUS
  Filled 2011-06-04: qty 2

## 2011-06-04 MED ORDER — FOLIC ACID 1 MG PO TABS
1.0000 mg | ORAL_TABLET | Freq: Every day | ORAL | Status: DC
  Administered 2011-06-05 – 2011-06-09 (×5): 1 mg via ORAL
  Filled 2011-06-04 (×6): qty 1

## 2011-06-04 MED ORDER — HEPARIN SODIUM (PORCINE) 5000 UNIT/ML IJ SOLN
5000.0000 [IU] | Freq: Three times a day (TID) | INTRAMUSCULAR | Status: DC
  Administered 2011-06-05 (×2): 5000 [IU] via SUBCUTANEOUS
  Filled 2011-06-04 (×4): qty 1

## 2011-06-04 MED ORDER — ONDANSETRON HCL 4 MG PO TABS: 4.0000 mg | ORAL_TABLET | Freq: Four times a day (QID) | ORAL | Status: DC | PRN

## 2011-06-04 MED ORDER — HYDROMORPHONE HCL PF 1 MG/ML IJ SOLN
1.0000 mg | Freq: Once | INTRAMUSCULAR | Status: AC
  Administered 2011-06-04: 1 mg via INTRAVENOUS

## 2011-06-04 MED ORDER — HYDROMORPHONE HCL PF 1 MG/ML IJ SOLN
1.0000 mg | INTRAMUSCULAR | Status: DC | PRN
  Administered 2011-06-04 (×2): 1 mg via INTRAVENOUS
  Filled 2011-06-04 (×3): qty 1

## 2011-06-04 MED ORDER — HYDROMORPHONE HCL PF 1 MG/ML IJ SOLN
1.0000 mg | INTRAMUSCULAR | Status: DC | PRN
  Administered 2011-06-05 – 2011-06-08 (×20): 1 mg via INTRAVENOUS
  Filled 2011-06-04 (×21): qty 1

## 2011-06-04 MED ORDER — SORBITOL 70 % SOLN
TOPICAL_OIL | Freq: Once | ORAL | Status: AC
  Administered 2011-06-05: 06:00:00 via RECTAL
  Filled 2011-06-04 (×2): qty 240

## 2011-06-04 MED ORDER — INSULIN ASPART 100 UNIT/ML ~~LOC~~ SOLN
0.0000 [IU] | Freq: Three times a day (TID) | SUBCUTANEOUS | Status: DC
  Administered 2011-06-06: 3 [IU] via SUBCUTANEOUS
  Filled 2011-06-04: qty 3

## 2011-06-04 MED ORDER — NICOTINE 14 MG/24HR TD PT24
14.0000 mg | MEDICATED_PATCH | Freq: Every day | TRANSDERMAL | Status: DC
  Administered 2011-06-06 – 2011-06-09 (×4): 14 mg via TRANSDERMAL
  Filled 2011-06-04 (×5): qty 1

## 2011-06-04 MED ORDER — ONDANSETRON HCL 4 MG/2ML IJ SOLN
4.0000 mg | Freq: Four times a day (QID) | INTRAMUSCULAR | Status: DC | PRN
  Administered 2011-06-05: 4 mg via INTRAVENOUS
  Filled 2011-06-04: qty 2

## 2011-06-04 MED ORDER — THIAMINE HCL 100 MG/ML IJ SOLN
100.0000 mg | Freq: Every day | INTRAMUSCULAR | Status: DC
  Filled 2011-06-04 (×4): qty 2

## 2011-06-04 MED ORDER — LORAZEPAM 1 MG PO TABS
1.0000 mg | ORAL_TABLET | Freq: Four times a day (QID) | ORAL | Status: AC | PRN
  Administered 2011-06-06: 1 mg via ORAL
  Filled 2011-06-04: qty 1

## 2011-06-04 MED ORDER — PANTOPRAZOLE SODIUM 40 MG PO TBEC
40.0000 mg | DELAYED_RELEASE_TABLET | Freq: Two times a day (BID) | ORAL | Status: DC
  Administered 2011-06-05 – 2011-06-09 (×9): 40 mg via ORAL
  Filled 2011-06-04 (×9): qty 1

## 2011-06-04 MED ORDER — SODIUM CHLORIDE 0.9 % IV SOLN
INTRAVENOUS | Status: DC
  Administered 2011-06-05 (×4): via INTRAVENOUS
  Administered 2011-06-06: 150 mL/h via INTRAVENOUS
  Administered 2011-06-06 – 2011-06-07 (×3): via INTRAVENOUS
  Administered 2011-06-08: 50 mL/h via INTRAVENOUS
  Administered 2011-06-08: 03:00:00 via INTRAVENOUS

## 2011-06-04 MED ORDER — LORAZEPAM 2 MG/ML IJ SOLN
1.0000 mg | Freq: Four times a day (QID) | INTRAMUSCULAR | Status: AC | PRN
  Administered 2011-06-05: 1 mg via INTRAVENOUS
  Filled 2011-06-04: qty 1

## 2011-06-04 MED ORDER — VITAMIN B-1 100 MG PO TABS
100.0000 mg | ORAL_TABLET | Freq: Every day | ORAL | Status: DC
  Administered 2011-06-05 – 2011-06-09 (×5): 100 mg via ORAL
  Filled 2011-06-04 (×6): qty 1

## 2011-06-04 MED ORDER — POLYETHYLENE GLYCOL 3350 17 G PO PACK
17.0000 g | PACK | Freq: Every day | ORAL | Status: DC
  Administered 2011-06-05: 17 g via ORAL
  Filled 2011-06-04 (×2): qty 1

## 2011-06-04 NOTE — ED Provider Notes (Signed)
Medical screening examination/treatment/procedure(s) were conducted as a shared visit with non-physician practitioner(s) and myself.  I personally evaluated the patient during the encounter  Orlie Dakin, MD 06/04/11 2204

## 2011-06-04 NOTE — ED Notes (Signed)
Pt transported to Xray. 

## 2011-06-04 NOTE — H&P (Signed)
PGY-1 History and Physical Note Family Medicine Teaching Service Amber M. Hairford, MD Service Pager: [phone]  Timothy Martinez is an 60 y.o. male.    Chief Complaint: Abdominal pain  HPI: Patient is a 60 yo male with PMH of alcohol abuse and chronic pancreatitis who presents to ED 5 days after hospital discharge for increasingly worsening abdominal pain. Patient states he has been following his discharge instructions including clear/soft diet, exercise and alcohol abstinence. Starting 2 days ago, he began having diffuse abdominal pain. He states he had a small bowel movement which did not help his pain. Yesterday, his abdomen was distended and he had sharp pains in all 4 quadrants of his abdomen. It was somewhat relieved with repositioning, but nothing else seemed to help including the Oxycodone he received at discharge. He vomited once yesterday when he tried to eat solid food, but otherwise no N/V. This morning, patient states his entire abdomen feels sore like it is "bruised." Since his pain had not improved, he came to the ED for further evaluation.   In the ED, he was found to have a lipase of 778 and his KUB showed constipation with questionable ileus. His pain is currently a 4/10. No nausea, chest pain, dysuria, edema.  PMH  Past Medical History  Diagnosis Date  . 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 No pertinent family history.  Social History:  Many years of alcohol use, currently not drinking. Smokes 1ppd. Unemployed (on disability)  Allergies: Morphine (itching)  Medications: None  Results for orders placed during the hospital encounter of 06/04/11 (from the past 48 hour(s))  CBC     Status: Abnormal   Collection Time   06/04/11  6:34 PM      Component Value Range Comment   WBC 12.6 (*) 4.0 - 10.5 (K/uL)    RBC 5.54  4.22 - 5.81 (MIL/uL)    Hemoglobin 17.9 (*) 13.0 - 17.0 (g/dL)    HCT 51.5  39.0 - 52.0 (%)    MCV 93.0  78.0 - 100.0 (fL)    MCH 32.3  26.0 - 34.0 (pg)    MCHC 34.8  30.0 - 36.0 (g/dL)    RDW 13.8  11.5 - 15.5 (%)    Platelets 243  150 - 400 (K/uL)   COMPREHENSIVE METABOLIC PANEL     Status: Abnormal   Collection Time   06/04/11  6:34 PM      Component Value Range Comment   Sodium 135  135 - 145 (mEq/L)    Potassium 4.2  3.5 - 5.1 (mEq/L)    Chloride 101  96 - 112 (mEq/L)    CO2 24  19 - 32 (mEq/L)    Glucose, Bld 125 (*) 70 - 99 (mg/dL)    BUN 11  6 - 23 (mg/dL)    Creatinine, Ser 0.83  0.50 - 1.35 (mg/dL)    Calcium 9.9  8.4 - 10.5 (mg/dL)    Total Protein 7.8  6.0 - 8.3 (g/dL)    Albumin 3.5  3.5 - 5.2 (g/dL)    AST 33  0 - 37 (U/L)    ALT 34  0 - 53 (U/L)    Alkaline Phosphatase 92  39 - 117 (U/L)    Total Bilirubin 0.5  0.3 - 1.2 (mg/dL)    GFR calc non Af Amer >90  >90 (mL/min)    GFR calc Af Amer >90  >90 (mL/min)  LIPASE, BLOOD     Status: Abnormal   Collection Time   06/04/11  6:34 PM      Component Value Range Comment   Lipase 778 (*) 11 - 59 (U/L)   ETHANOL     Status: Normal   Collection Time   06/04/11  6:35 PM      Component Value Range Comment   Alcohol, Ethyl (B) <11  0 - 11 (mg/dL)   POCT I-STAT TROPONIN I     Status: Normal   Collection Time   06/04/11  7:08 PM      Component Value Range Comment   Troponin i, poc 0.00  0.00 - 0.08 (ng/mL)    Comment 3             Dg Abd Acute W/chest  06/04/2011  *RADIOLOGY REPORT*  Clinical Data: Abdominal pain and distention.  ACUTE ABDOMEN SERIES (ABDOMEN 2 VIEW & CHEST 1 VIEW)  Comparison: 01/06/2009.  Findings: The upright chest x-ray demonstrates chronic lung changes.  No acute pulmonary findings.  Two views of the abdomen demonstrate a large amount of stool in the colon suggesting constipation.  Air filled small bowel loops without significant distention or air fluid levels.  No free air.  IMPRESSION:  1.  Chronic lung changes but no acute pulmonary findings. 2.    No findings for obstruction or  perforation.  Possible ileus and constipation.  Original Report Authenticated By: P. Kalman Jewels, M.D.  .   Review of Systems  Constitutional: Positive for diaphoresis. Negative for fever and chills.  HENT: Negative for congestion and sore throat.   Respiratory: Negative for cough and shortness of breath.   Cardiovascular: Negative for chest pain and palpitations.  Gastrointestinal: Positive for nausea, vomiting and abdominal pain. Negative for diarrhea and constipation.  Genitourinary: Negative for dysuria and urgency.  Musculoskeletal: Negative for myalgias.  Skin: Negative for rash.  Neurological: Negative for dizziness and headaches.  Psychiatric/Behavioral: Negative for depression and suicidal ideas.  All other systems reviewed and are negative.    Per HPI  Vitals: Temp. 98.1 BP 116/64 HR 85 RR 16  Physical Exam  Constitutional: He is oriented to person, place, and time. He appears well-developed and well-nourished. No distress.       Overweight  HENT:  Head: Normocephalic and atraumatic.  Eyes: EOM are normal. Pupils are equal, round, and reactive to light.  Neck: Normal range of motion. Neck supple. No JVD present. No thyromegaly present.  Cardiovascular: Normal rate, regular rhythm and normal heart sounds.  Exam reveals no gallop and no friction rub.   No murmur heard. Respiratory: Effort normal and breath sounds normal. He has no wheezes. He has no rales. He exhibits no tenderness.  GI: Bowel sounds are normal. He exhibits distension. He exhibits no mass. There is no hepatosplenomegaly. There is generalized tenderness. There is guarding (voluntary). There is no rebound.  Musculoskeletal: Normal range of motion. He exhibits no edema and no tenderness.  Lymphadenopathy:    He has cervical adenopathy (shotty).  Neurological: He is alert and oriented to person, place, and time. No cranial nerve deficit.  Skin: Skin is warm and dry. No rash noted. He is not diaphoretic. No  erythema.  Psychiatric: He has a normal mood and affect.     Assessment/Plan 60 year old male with history of chronic pancreatitis presents with abdominal pain, found to have elevated lipase and constipation.  1. Abdominal pain: Most likely secondary to acute on chronic pancreatitis. Patient  was discharged from the hospital 5 days ago for pancreatitis. He states he has not had any alcohol since discharge. His lipase is elevated to 778 today.  Abdominal film showed constipation and questionable ileus which could also be contributing to his pain. Patient states he has only had one small bowel movement in the last week. His diet has been mostly clears and soft food. He did have a few bites of pasta yesterday which made him vomit. Otherwise, he has been tolerating liquids with no problems. His abdominal pain has become increasing worse in the last 3 days.  - Give Dilaudid 1mg  q 3 hours prn for pain.   - Get repeat labs in the morning  - Pancreatitis: Will get abdominal ultrasound to evaluate for stones that would cause increase in lipase. LFT's within normal limits.  - Constipation: No N/V so no need for NGT or NPO at this time. Will give milk of molasses enema tonight and start Miralax regimen tomorrow. Start clear liquid diet since patient states he is hungry.  2. Alcohol use: Patient states he has not had any alcohol since he left the hospital. He denies any withdrawal symptoms.   -Will start CIWA protocol given his history.  3. Diabetes: Diagnosed during last hospital admission. Not started on any medications, but encouraged lifestyle changes. Patient states he has been doing well with this  - Start SSI  4. Tobacco abuse: Decreased smoking to 1 pack per day.  -Will order Nicotine patch 53mcg  5. Hyperlipidemia: Diagnosed during last hospital admission. Started on statin.  - Restart statin tomorrow  6. FEN: Clear liquid diet, NS at 125cc/hr   7. PPx: Heparin 5000 units SQ TID, Protonix 40  po BID  8. Dispo: Pending abdominal ultrasound report, tolerating PO diet and overall clinical improvement.   HAIRFORD, [place] 06/04/2011, 9:07 PM    PGY-2 ADDENDUM  I have seen and examined the patient. I agree with excellent PGY-1 note.   S: Briefly, this is a 60 yo M recently d/c'ed with dx of acute on chronic pancreatitis 2/2 alcoholism back today for worsening abdominal pain.  Pt states that he has not drank any alcohol and has been "eating well" since his d/c 5 days ago.  He was doing well with increased activity, not drinking, and cutting down on smoking until 2 days ago when he ate some mac n' cheese and started having worsening abdominal pain.  He feels like his stomach is swollen and that he is "being kicked all over."  He described the abdominal pain as diffuse and tender/sore with occasional sharp pains in no specific area.  He also endorses epigastric pain similar to his previous pancreatitis flares.  Lipase in ED was 778.  He states the only solids he has eaten are mac n' cheese and some scrambled eggs, otherwise it has been all soups, broths, and liquids.  He had a small bowel movement 2 days ago with minimal relief of abdominal pain, has not had any other BMs since then.  Vomited x1 yesterday morning- nonbloody, nonbilious.  Also having sweats off and on the past 1-2 days.  O:  Filed Vitals:   06/04/11 1943  BP: 121/63  Pulse: 86  Temp:   Resp: 18    Gen: Alert and oriented, uncomfortable appearing  HEENT: slightly dry MM, EOMI, PERRLA, + shotty cervical LAD, no jaundice or icterus, pharynx very erythematous w/o exudate CV: RRR, no murmurs Pulm: CTAB, no wheezes or crackles Abd: +BS x4 quad, minimally  distended and tympanic, diffuse mild TTP, voluntary guarding, no rebound or CVA tenderness, no hepatosplenomegaly appreciated   Ext: warm, well perfused, 2+ pulses throughout  Labs:  Results for orders placed during the hospital encounter of 06/04/11 (from the past 24  hour(s))  CBC     Status: Abnormal   Collection Time   06/04/11  6:34 PM      Component Value Range   WBC 12.6 (*) 4.0 - 10.5 (K/uL)   RBC 5.54  4.22 - 5.81 (MIL/uL)   Hemoglobin 17.9 (*) 13.0 - 17.0 (g/dL)   HCT 51.5  39.0 - 52.0 (%)   MCV 93.0  78.0 - 100.0 (fL)   MCH 32.3  26.0 - 34.0 (pg)   MCHC 34.8  30.0 - 36.0 (g/dL)   RDW 13.8  11.5 - 15.5 (%)   Platelets 243  150 - 400 (K/uL)  COMPREHENSIVE METABOLIC PANEL     Status: Abnormal   Collection Time   06/04/11  6:34 PM      Component Value Range   Sodium 135  135 - 145 (mEq/L)   Potassium 4.2  3.5 - 5.1 (mEq/L)   Chloride 101  96 - 112 (mEq/L)   CO2 24  19 - 32 (mEq/L)   Glucose, Bld 125 (*) 70 - 99 (mg/dL)   BUN 11  6 - 23 (mg/dL)   Creatinine, Ser 0.83  0.50 - 1.35 (mg/dL)   Calcium 9.9  8.4 - 10.5 (mg/dL)   Total Protein 7.8  6.0 - 8.3 (g/dL)   Albumin 3.5  3.5 - 5.2 (g/dL)   AST 33  0 - 37 (U/L)   ALT 34  0 - 53 (U/L)   Alkaline Phosphatase 92  39 - 117 (U/L)   Total Bilirubin 0.5  0.3 - 1.2 (mg/dL)   GFR calc non Af Amer >90  >90 (mL/min)   GFR calc Af Amer >90  >90 (mL/min)  LIPASE, BLOOD     Status: Abnormal   Collection Time   06/04/11  6:34 PM      Component Value Range   Lipase 778 (*) 11 - 59 (U/L)  ETHANOL     Status: Normal   Collection Time   06/04/11  6:35 PM      Component Value Range   Alcohol, Ethyl (B) <11  0 - 11 (mg/dL)  POCT I-STAT TROPONIN I     Status: Normal   Collection Time   06/04/11  7:08 PM      Component Value Range   Troponin i, poc 0.00  0.00 - 0.08 (ng/mL)   Comment 3           URINALYSIS, ROUTINE W REFLEX MICROSCOPIC     Status: Abnormal   Collection Time   06/04/11  8:43 PM      Component Value Range   Color, Urine AMBER (*) YELLOW    Appearance CLEAR  CLEAR    Specific Gravity, Urine 1.026  1.005 - 1.030    pH 5.5  5.0 - 8.0    Glucose, UA NEGATIVE  NEGATIVE (mg/dL)   Hgb urine dipstick NEGATIVE  NEGATIVE    Bilirubin Urine MODERATE (*) NEGATIVE    Ketones, ur 15  (*) NEGATIVE (mg/dL)   Protein, ur NEGATIVE  NEGATIVE (mg/dL)   Urobilinogen, UA 1.0  0.0 - 1.0 (mg/dL)   Nitrite NEGATIVE  NEGATIVE    Leukocytes, UA SMALL (*) NEGATIVE   URINE MICROSCOPIC-ADD ON     Status: Abnormal  Collection Time   06/04/11  8:43 PM      Component Value Range   Squamous Epithelial / LPF RARE  RARE    WBC, UA 0-2  <3 (WBC/hpf)   RBC / HPF 0-2  <3 (RBC/hpf)   Bacteria, UA FEW (*) RARE    Casts HYALINE CASTS (*) NEGATIVE    Urine-Other MUCOUS PRESENT       Dg Chest 2 View  05/28/2011  *RADIOLOGY REPORT*  Clinical Data: Mid chest pain today.  History of pancreatitis.  CHEST - 2 VIEW  Comparison: The 09/2006 chest radiographs.  Findings: There is chronic lung disease with interstitial prominence and mildly progressive biapical scarring.  No confluent airspace opacity, edema or pleural effusion is identified.  Heart size and mediastinal contours are stable.  IMPRESSION: Chronic lung disease with mildly progressive biapical scarring.  No acute cardiopulmonary process.  Original Report Authenticated By: Vivia Ewing, M.D.   Dg Abd Acute W/chest  06/04/2011  *RADIOLOGY REPORT*  Clinical Data: Abdominal pain and distention.  ACUTE ABDOMEN SERIES (ABDOMEN 2 VIEW & CHEST 1 VIEW)  Comparison: 01/06/2009.  Findings: The upright chest x-ray demonstrates chronic lung changes.  No acute pulmonary findings.  Two views of the abdomen demonstrate a large amount of stool in the colon suggesting constipation.  Air filled small bowel loops without significant distention or air fluid levels.  No free air.  IMPRESSION:  1.  Chronic lung changes but no acute pulmonary findings. 2.    No findings for obstruction or perforation.  Possible ileus and constipation.  Original Report Authenticated By: P. Kalman Jewels, M.D.    A/P: 60 year old male with history of chronic pancreatitis secondary to alcohol abuse presents with chest and abdominal pain, found to have elevated lipase.   1.  Pancreatitis/elevated lipase: Acute on chronic pancreatitis possibly triggered by alcohol (although pt denies use since 11/2) vs stone/obstruction vs other. Lipase is elevated at 778 and patient reports pain somewhat similar to prior pancreatitis episodes. -Will start supportive treatment with bowel rest, IVF, and pain control. No need for NG placement at this time.  -Clear liquids tonight and advance diet as tolerated.  -NS @ 125cc/hr -For pain, will give dilaudid 1mg  IV q3hr PRN for pain. -Protonix 40mg  po BID- for possible GERD component.  -no need for repeat lipase in AM as it will not change mgmt. but will check BMET and CBC in AM  -abd [place] for ?stones since pt with improving pancreatitis 2/2 alcohol now with sudden worsening and denial of any alcohol use  2. Abdominal pain: Abd slightly distended on exam, XR c/w constipation vs ileus -will give MOM enema tonight and start miralax tomorrow for bowel regimen  -pain control with dilaudid  3. DM: new diagnosis on last admission -sensitive SSI while in house  4. Alcohol use: although pt denies alcohol use since d/c 11/5, will place on CIWA protocol and write for social work for alcohol cessation   5. Tobacco abuse: Nicotine patch and social work consult for smoking cessation   6. HLD: will restart statin in the AM if pt tolerates clears O/N  7. FEN/GI: clear liquid and advance as tolerated, NS at 125cc/hr  8. PPx: heparin 5000 units SQ TID, protonix 40mg  po BID   9. Dispo: admit to floor bed, d/c pending improvement in pain control and po toleration   Shailene Demonbreun 06/04/2011 9:45 PM

## 2011-06-04 NOTE — ED Notes (Signed)
Patient back from  X-ray 

## 2011-06-04 NOTE — ED Provider Notes (Signed)
History     CSN: [id number] Arrival date & time: 06/04/2011  5:24 PM   First MD Initiated Contact with Patient 06/04/11 1751      Chief Complaint  Patient presents with  . Abdominal Pain    began last night, tight, hx of pancreatitis,similar symptoms, N/V also    (Consider location/radiation/quality/duration/timing/severity/associated sxs/prior treatment) Patient is a 60 y.o. male presenting with abdominal pain. The history is provided by the patient.  Abdominal Pain The primary symptoms of the illness include abdominal pain, nausea and vomiting. The primary symptoms of the illness do not include diarrhea. The current episode started more than 2 days ago. The onset of the illness was gradual. The problem has been gradually worsening.  Additional symptoms associated with the illness include constipation. Significant associated medical issues include substance abuse.  Pt with history of alcoholic pancreatitis. States was just discharged 4 days ago, after admission for the same thing. Has been on liquid diet since then, taking oxycodone for pain. States since then increased diffuse abdominal pain, distention, nausea, vomiting. States one bowel movement since then, yesterday, small. Called his PCP, was told to come to ER.  Denies alcohol, fever, chills since then.   Past Medical History  Diagnosis Date  . Pancreas disorder     History reviewed. No pertinent past surgical history.  History reviewed. No pertinent family history.  History  Substance Use Topics  . Smoking status: Current Everyday Smoker  . Smokeless tobacco: Not on file  . Alcohol Use: No     quit      Review of Systems  Constitutional: Negative.   HENT: Negative.   Eyes: Negative.   Respiratory: Negative.   Cardiovascular: Negative.   Gastrointestinal: Positive for nausea, vomiting, abdominal pain, constipation and abdominal distention. Negative for diarrhea and blood in stool.  Genitourinary: Negative.     Musculoskeletal: Negative.   Neurological: Negative.     Allergies  Morphine and related and Penicillins  Home Medications   Current Outpatient Rx  Name Route Sig Dispense Refill  . OXYCODONE HCL 5 MG PO CAPS Oral Take 5 mg by mouth every 4 (four) hours as needed. pain       BP 115/73  Pulse 91  Temp(Src) 98 F (36.7 C) (Oral)  SpO2 93%  Physical Exam  Constitutional: He is oriented to person, place, and time. He appears well-developed and well-nourished. He appears distressed.  HENT:  Head: Normocephalic and atraumatic.  Eyes: Pupils are equal, round, and reactive to light.  Neck: Normal range of motion. Neck supple.  Cardiovascular: Normal rate, regular rhythm and normal heart sounds.   Pulmonary/Chest: Effort normal and breath sounds normal. No respiratory distress.  Abdominal: Soft. Bowel sounds are normal. He exhibits distension. There is tenderness.       Diffuse abdominal tenderness in all quadrants. No guarding or rebound tenderness  Musculoskeletal: Normal range of motion.  Neurological: He is alert and oriented to person, place, and time. He has normal reflexes.  Skin: Skin is warm and dry.    ED Course  Procedures (including critical care time)  Results for orders placed during the hospital encounter of 06/04/11  CBC      Component Value Range   WBC 12.6 (*) 4.0 - 10.5 (K/uL)   RBC 5.54  4.22 - 5.81 (MIL/uL)   Hemoglobin 17.9 (*) 13.0 - 17.0 (g/dL)   HCT 51.5  39.0 - 52.0 (%)   MCV 93.0  78.0 - 100.0 (fL)   MCH 32.3  26.0 - 34.0 (pg)   MCHC 34.8  30.0 - 36.0 (g/dL)   RDW 13.8  11.5 - 15.5 (%)   Platelets 243  150 - 400 (K/uL)  COMPREHENSIVE METABOLIC PANEL      Component Value Range   Sodium 135  135 - 145 (mEq/L)   Potassium 4.2  3.5 - 5.1 (mEq/L)   Chloride 101  96 - 112 (mEq/L)   CO2 24  19 - 32 (mEq/L)   Glucose, Bld 125 (*) 70 - 99 (mg/dL)   BUN 11  6 - 23 (mg/dL)   Creatinine, Ser 0.83  0.50 - 1.35 (mg/dL)   Calcium 9.9  8.4 - 10.5 (mg/dL)    Total Protein 7.8  6.0 - 8.3 (g/dL)   Albumin 3.5  3.5 - 5.2 (g/dL)   AST 33  0 - 37 (U/L)   ALT 34  0 - 53 (U/L)   Alkaline Phosphatase 92  39 - 117 (U/L)   Total Bilirubin 0.5  0.3 - 1.2 (mg/dL)   GFR calc non Af Amer >90  >90 (mL/min)   GFR calc Af Amer >90  >90 (mL/min)  LIPASE, BLOOD      Component Value Range   Lipase 778 (*) 11 - 59 (U/L)  ETHANOL      Component Value Range   Alcohol, Ethyl (B) <11  0 - 11 (mg/dL)  POCT I-STAT TROPONIN I      Component Value Range   Troponin i, poc 0.00  0.00 - 0.08 (ng/mL)   Comment 3            Dg Chest 2 View  05/28/2011  *RADIOLOGY REPORT*  Clinical Data: Mid chest pain today.  History of pancreatitis.  CHEST - 2 VIEW  Comparison: The 09/2006 chest radiographs.  Findings: There is chronic lung disease with interstitial prominence and mildly progressive biapical scarring.  No confluent airspace opacity, edema or pleural effusion is identified.  Heart size and mediastinal contours are stable.  IMPRESSION: Chronic lung disease with mildly progressive biapical scarring.  No acute cardiopulmonary process.  Original Report Authenticated By: Vivia Ewing, M.D.    Lipase today 778, which is higher then while previously admitted. Will call family medicine to come see pt and admit for further treatment. Fluids ordered, pain meds ordrered.  Spoke with Family Medicine, will come admit.  [place], [place] 06/04/11 2008

## 2011-06-04 NOTE — ED Notes (Signed)
Report called, patient to go to room [address]31

## 2011-06-04 NOTE — ED Provider Notes (Signed)
Complains of abdominal pain epigastric area typical of pancreatitis he's had in the past accompanied by vomiting pain is uncontrolled by pain medicine prescribed to him from his last inpatient stay. Denies alcohol use in the past week last alcoholic intake was [id number]  Orlie Dakin, MD 06/04/11 2003

## 2011-06-04 NOTE — Telephone Encounter (Signed)
Stomach is bloated, started last night. Pt w/ pancreatitis, recently d/c'ed from hospital.  Took 2 pain pills and they haven't helped.  Has only been having clear liquids, but feels like he is still getting worse. All abdominal pain, no chest pain.  Is having shortness of breath. Vomit x1 yesterday, + nausea, + diaphoresis. Had small BM yesterday.  Sounds very frantic on the phone. Feels like he needs to come in to the ER. I told the patient that I agree he should likely come to the ED to be evaluated by an M.D. if he is having trouble with abdominal bloating, nausea, and vomiting. Pt states that his brother is calling EMS and he will come to [facility] for evaluation.

## 2011-06-05 ENCOUNTER — Inpatient Hospital Stay (HOSPITAL_COMMUNITY): Payer: Medicare Other

## 2011-06-05 ENCOUNTER — Encounter (HOSPITAL_COMMUNITY): Payer: Self-pay | Admitting: *Deleted

## 2011-06-05 DIAGNOSIS — E119 Type 2 diabetes mellitus without complications: Secondary | ICD-10-CM

## 2011-06-05 DIAGNOSIS — F1021 Alcohol dependence, in remission: Secondary | ICD-10-CM

## 2011-06-05 DIAGNOSIS — K625 Hemorrhage of anus and rectum: Secondary | ICD-10-CM

## 2011-06-05 DIAGNOSIS — K859 Acute pancreatitis without necrosis or infection, unspecified: Secondary | ICD-10-CM

## 2011-06-05 LAB — BASIC METABOLIC PANEL
BUN: 13 mg/dL (ref 6–23)
CO2: 24 mEq/L (ref 19–32)
Calcium: 9.1 mg/dL (ref 8.4–10.5)
Chloride: 105 mEq/L (ref 96–112)
Creatinine, Ser: 0.87 mg/dL (ref 0.50–1.35)
GFR calc Af Amer: >90 mL/min (ref >90)
GFR calc non Af Amer: >90 mL/min (ref >90)
Glucose, Bld: 91 mg/dL (ref 70–99)
Potassium: 4.3 mEq/L (ref 3.5–5.1)
Sodium: 138 mEq/L (ref 135–145)

## 2011-06-05 LAB — CBC
HCT: 48.4 % (ref 39.0–52.0)
Hemoglobin: 16.7 g/dL (ref 13.0–17.0)
MCH: 32.6 pg (ref 26.0–34.0)
MCHC: 34.5 g/dL (ref 30.0–36.0)
MCV: 94.5 fL (ref 78.0–100.0)
Platelets: 247 10*3/uL (ref 150–400)
RBC: 5.12 MIL/uL (ref 4.22–5.81)
RDW: 14 % (ref 11.5–15.5)
WBC: 12.5 10*3/uL — ABNORMAL HIGH (ref 4.0–10.5)

## 2011-06-05 LAB — GLUCOSE, CAPILLARY
Glucose-Capillary: 101 mg/dL — ABNORMAL HIGH (ref 70–99)
Glucose-Capillary: 114 mg/dL — ABNORMAL HIGH (ref 70–99)
Glucose-Capillary: 118 mg/dL — ABNORMAL HIGH (ref 70–99)
Glucose-Capillary: 92 mg/dL (ref 70–99)
Glucose-Capillary: 94 mg/dL (ref 70–99)

## 2011-06-05 NOTE — Progress Notes (Signed)
Subjective: Pt seen sitting on toilet this morning. He received an enema around 7am for upcoming abdominal ultrasound and has had some bright red blood per rectum since that time. Pt denies history of blood in his stool, denies history of hemorrhoids, denies rectal pain. Pt complains of diffuse 9/10 abdominal pain and cramping. He is currently getting 1mg  IV Dilaudid q 3hrs which he reports does not last the full 3 hrs. Pt was given Ativan per CIWA protocol for nausea this morning, which is now improved. No other withdrawal symptoms noted, last alcohol use on 05/27/11.    Objective: Vital signs in last 24 hours: Temp:  [98 F (36.7 C)-99.2 F (37.3 C)] 98.8 F (37.1 C) (11/11 0605) Pulse Rate:  [81-91] 85  (11/11 0605) Resp:  [18-20] 20  (11/11 0605) BP: (110-127)/(41-95) 118/95 mmHg (11/11 0605) SpO2:  [90 %-94 %] 94 % (11/11 0605) Weight:  [205 lb 11 oz (93.3 kg)] 205 lb 11 oz (93.3 kg) (11/11 0015) Weight change:  Last BM Date: 06/05/11  Intake/Output from previous day: 11/10 0701 - 11/11 0700 In: 835.4 [I.V.:835.4] Out: 426 [Urine:425; Emesis/NG output:1] Intake/Output this shift:      Lab Results:  Basename 06/05/11 0600 06/04/11 1834  WBC 12.5* 12.6*  HGB 16.7 17.9*  HCT 48.4 51.5  PLT 247 243   BMET  Basename 06/05/11 0600 06/04/11 1834  NA 138 135  K 4.3 4.2  CL 105 101  CO2 24 24  GLUCOSE 91 125*  BUN 13 11  CREATININE 0.87 0.83  CALCIUM 9.1 9.9    Studies/Results: Dg Abd Acute W/chest  06/04/2011  *RADIOLOGY REPORT*  Clinical Data: Abdominal pain and distention.  ACUTE ABDOMEN SERIES (ABDOMEN 2 VIEW & CHEST 1 VIEW)  Comparison: 01/06/2009.  Findings: The upright chest x-ray demonstrates chronic lung changes.  No acute pulmonary findings.  Two views of the abdomen demonstrate a large amount of stool in the colon suggesting constipation.  Air filled small bowel loops without significant distention or air fluid levels.  No free air.  IMPRESSION:  1.  Chronic  lung changes but no acute pulmonary findings. 2.    No findings for obstruction or perforation.  Possible ileus and constipation.  Original Report Authenticated By: P. Kalman Jewels, M.D.    Medications: I have reviewed the patient's current medications.  Assessment/Plan:  Pt is a 60 year old male with a history of chronic alcohol use, pancreatitis, hyperlipidemia, and diabetes. Pt was recently discharged from this hospital on 05/30/11 after an acute bout of pancreatitis. He presented again on 06/04/11 with abdominal pain and constipation, and was found to have a lipase 778. He remains afebrile with normal, stable vital signs, BMet is WNL, CBC with slightly elevated WBC of 12.5.   Abdominal pain: Likely acute exacerbation of chronic pancreatitis. Lipase elevated on admission. Abdominal XR revealed constipation with a possible ileus. Pt reports last BM 06/02/11. Had been tolerating diet as outpatient until abdominal pain continued to worsen and he came to the hospital yesterday. He has been NPO overnight for abdominal ultrasound pending today.  -Continue dilaudid 1mg  q 3hrs IV for pain. -Will repeat labs as clinically indicated.  -Abdominal ultasound pending today to evaluate for possible stones contributing to recurrence of acute pancreatitis.   Chronic alcohol use: Pt reports no alcohol use since prior to last hospital admission. Pt on CIWA protocol, was given Ativan x 1 for nausea which relieved his symptoms.  -Continue CIWA protocol.   Type II DM: Diagnosed during  last hospital admission.  -Continue SSI.   Tobacco abuse: Receiving nicotine patch.   Hyperlipidemia: Diagnosed during last hospital admission.  -Will restart on home statin once discharged.   FEN: Clear liquid diet, NS at 125cc/hr.  PPx: Heparin 5000 units SQ TID, Protonix 40 po BID   Dispo: Pending abdominal ultrasound report. Will continue to follow clinically.      LOS: 1 day  Timothy Blamer, MS-IV    PGY-2  ADDENDUM  I have seen and examined the pt and agree with excellent MS-IV note. Timothy Martinez was sleepy/dazed this morning during his interview but stated he did not feel his pain was any better.  Thinks that the pain comes back 2hrs and 45 min after medicine is given-- having a hard time making it to 3 hours.  PHYSICAL EXAM Filed Vitals:   06/05/11 0605  BP: 118/95  Pulse: 85  Temp: 98.8 F (37.1 C)  Resp: 20   Gen: Alert and oriented, very sleep, NAD  HEENT: M MM, EOMI, PERRLA, no jaundice or icterus  CV: RRR, no murmurs  Pulm: CTAB, no wheezes or crackles  Abd: +BS x4 quad, less distended and tympanic, diffuse mild TTP, voluntary guarding, no rebound or CVA tenderness, no hepatosplenomegaly appreciated  Ext: warm, well perfused, 2+ pulses throughout   A/P: 60 year old male with history of chronic pancreatitis secondary to alcohol abuse presents with chest and abdominal pain, found to have elevated lipase.  1. Pancreatitis/elevated lipase: Acute on chronic pancreatitis possibly triggered by alcohol (although pt denies use since 11/2) vs stone/obstruction vs other. Lipase is elevated at 778 and patient reports pain somewhat similar to prior pancreatitis episodes.  -Will start supportive treatment with bowel rest, IVF, and pain control. No need for NG placement at this time.  -Clear liquids,  advance diet as tolerated.  -NS @ 125cc/hr   will d/c once taking better po  -For pain, will give dilaudid 1mg  IV q3hr PRN for pain.  -Protonix 40mg  po BID- for possible GERD component.  -still waiting for abd [place] for ?stones since pt with improving pancreatitis 2/2 alcohol now with sudden worsening and denial of any alcohol use   2. Abdominal pain: Abd slightly distended on exam but improved from admission, XR c/w constipation vs ileus  -MOM enema with good stool results   -pain control with dilaudid   3. DM: new diagnosis on last admission  -sensitive SSI while in house   4. Alcohol use:  although pt denies alcohol use since d/c 11/5, will place on CIWA protocol  has needed some Ativan since admission    5. Tobacco abuse: Nicotine patch and social work consult for smoking cessation   6. HLD: consider restarting statin today vs tomorrow    7. FEN/GI: clear liquid and advance as tolerated, NS at 125cc/hr   8. PPx: heparin 5000 units SQ TID, protonix 40mg  po BID   9. Dispo: d/c pending improvement in pain control and po toleration  Timothy Martinez 06/05/2011 12:41 PM

## 2011-06-05 NOTE — H&P (Signed)
Seen and examined.   Timothy Martinez seems to be a straightforward case of pancreatitis.  The question is why.  He states he had pancreatitis 2 years ago and then again last week.  Both of those spells were related to EtOH intake.  He went home 6 days ago, felt great for several days, denies any alcohol intake and then began having abd pain again.    Agree with ultrasound to check for possible gallstone pancreatitis.  He had lipid panel last admit and triglycerides were not elevated.  He is on no suspect meds.    Agree with trying to feed through this spell.  We shall see how this evolves.

## 2011-06-06 LAB — PROTIME-INR
INR: 1.16 (ref 0.00–1.49)
Prothrombin Time: 15 seconds (ref 11.6–15.2)

## 2011-06-06 LAB — CBC
HCT: 47 % (ref 39.0–52.0)
HCT: 44.3 % (ref 39.0–52.0)
HCT: 43.8 % (ref 39.0–52.0)
Hemoglobin: 16.4 g/dL (ref 13.0–17.0)
Hemoglobin: 15.4 g/dL (ref 13.0–17.0)
Hemoglobin: 15 g/dL (ref 13.0–17.0)
MCH: 32.3 pg (ref 26.0–34.0)
MCH: 32.2 pg (ref 26.0–34.0)
MCH: 31.6 pg (ref 26.0–34.0)
MCHC: 34.9 g/dL (ref 30.0–36.0)
MCHC: 34.8 g/dL (ref 30.0–36.0)
MCHC: 34.2 g/dL (ref 30.0–36.0)
MCV: 92.5 fL (ref 78.0–100.0)
MCV: 92.7 fL (ref 78.0–100.0)
MCV: 92.2 fL (ref 78.0–100.0)
Platelets: 249 10*3/uL (ref 150–400)
Platelets: 244 10*3/uL (ref 150–400)
Platelets: 250 10*3/uL (ref 150–400)
RBC: 5.08 MIL/uL (ref 4.22–5.81)
RBC: 4.78 MIL/uL (ref 4.22–5.81)
RBC: 4.75 MIL/uL (ref 4.22–5.81)
RDW: 13.5 % (ref 11.5–15.5)
RDW: 13.6 % (ref 11.5–15.5)
RDW: 13.6 % (ref 11.5–15.5)
WBC: 18.6 10*3/uL — ABNORMAL HIGH (ref 4.0–10.5)
WBC: 19.3 10*3/uL — ABNORMAL HIGH (ref 4.0–10.5)
WBC: 20.3 10*3/uL — ABNORMAL HIGH (ref 4.0–10.5)

## 2011-06-06 LAB — BASIC METABOLIC PANEL
BUN: 10 mg/dL (ref 6–23)
CO2: 24 mEq/L (ref 19–32)
Calcium: 8.7 mg/dL (ref 8.4–10.5)
Chloride: 102 mEq/L (ref 96–112)
Creatinine, Ser: 0.8 mg/dL (ref 0.50–1.35)
GFR calc Af Amer: >90 mL/min (ref >90)
GFR calc non Af Amer: >90 mL/min (ref >90)
Glucose, Bld: 147 mg/dL — ABNORMAL HIGH (ref 70–99)
Potassium: 4 mEq/L (ref 3.5–5.1)
Sodium: 135 mEq/L (ref 135–145)

## 2011-06-06 LAB — GLUCOSE, CAPILLARY
Glucose-Capillary: 115 mg/dL — ABNORMAL HIGH (ref 70–99)
Glucose-Capillary: 90 mg/dL (ref 70–99)
Glucose-Capillary: 216 mg/dL — ABNORMAL HIGH (ref 70–99)

## 2011-06-06 MED ORDER — BOOST / RESOURCE BREEZE PO LIQD
1.0000 | Freq: Two times a day (BID) | ORAL | Status: DC
  Administered 2011-06-06 – 2011-06-09 (×3): 1 via ORAL

## 2011-06-06 NOTE — Progress Notes (Signed)
INITIAL ADULT NUTRITION ASSESSMENT Date: 06/06/2011   Time: 11:13 AM  Reason for Assessment: Nutrition Risk Report  ASSESSMENT: Male, 60 years old  Dx: Chronic alcoholic pancreatitis  Hx:  Past Medical History  Diagnosis Date  . Pancreas disorder   . Diabetes mellitus   . Hyperlipidemia   . Shortness of breath     with ambulation   Related Meds: folic acid, insulin, MVI with minerals, pantoprazole, thiamine  Ht: 6\' 3"  (190.5 cm)  Wt: 205 lb 11 oz (93.3 kg)  Ideal Wt: 89.1kg % Ideal Wt: 105%  Usual Wt: 97.7kg (215#) 2 weeks ago per patient % Usual Wt: 95.4%  Body mass index is 25.71 kg/(m^2).  Food/Nutrition Related Hx: Regular diet PTA, no significant  appetite changes PTA  CBG: 115 - 92 - 114 Labs on 06/05/11: Sodium 138 mEq/L      Potassium 4.3 mEq/L      Chloride 105 mEq/L      CO2 24 mEq/L      Glucose, Bld 91 mg/dL      BUN 13 mg/dL      Creatinine, Ser 0.87 mg/dL      Calcium 9.1 mg/dL      GFR calc non Af Amer >90 mL/min      GFR calc Af Amer >90 mL/min    I/O last 3 completed shifts: In: 835.4 [I.V.:835.4] Out: 426 [Urine:425; Emesis/NG output:1]    Diet Order: Clear Liquid  Supplements/Tube Feeding: None  IVF:    sodium chloride Last Rate: 150 mL/hr (06/06/11 1000)    Estimated Nutritional Needs:   Kcal: 1900 - 2100 Protein: 95 - 110g Fluid: 1.9 - 2 L/d  Patient recently d/c from hospital on 11/5. Pt reports he has been on a clear liquid for approximately 2 weeks. Also reports weight loss during this time frame, of approximately 9# 2/2 prolonged clear liquid intake. Patient is at nutrition risk 2/2 prolonged clear liquid intake and hx of chronic alcohol intake. Patient reports ability to tolerate clear liquids at this time, agreeable to [employer]. Noted radiology report results, ?ileus and constipation, no obstruction or perforation. No skin breakdown.  NUTRITION DIAGNOSIS: -Inadequate protein energy intake (NI-5.3).  Status:  Ongoing  RELATED TO: prolonged clear liquid intake  AS EVIDENCE BY: patient report, provision of kcals and protein in clear liquid diet.  MONITORING/EVALUATION(Goals): Goal: Pt to advance to diet as tolerated. Monitor: Diet advancement, tolerance of clear liquids, weights, labs  EDUCATION NEEDS: -No education needs identified at this time  INTERVENTION: 1. Resource Breeze PO BID 2. RD to monitor for diet advancement; if unable to progress diet may need nutrition support. 3. RD to follow care plan.  Dietitian #: [phone]  [place] Per approved criteria  -Not Applicable    Asencion Partridge 06/06/2011, 11:13 AM

## 2011-06-06 NOTE — Progress Notes (Signed)
PGY-1 Daily Progress Note Family Medicine Teaching Service Brayton Mars. Melanee Spry, MD      Service Pager: [phone]  Subjective:  Pt with multiple bloody stools yesterday after having enema placed. Resolved overnight. Patient had hgb of 16.4 yesterday. No external hemorrhoids on exam yesterday. Patient with diffuse abdominal pain 9/10 when gets to 3 hours from dilaudid. 4/10 after dilaudid. Got a 1x dose of ativan yesterday but this was when he was having bloody stools. No other signs of withdrawal. Still claims no alcohol use since discharge.  After seeing patient: large volume rectal bleeding noted per nursing on BM.     Objective: Vital signs in last 24 hours: Temp:  [98.1 F (36.7 C)-98.9 F (37.2 C)] 98.3 F (36.8 C) (11/12 0734) Pulse Rate:  [76-97] 97  (11/12 0734) Resp:  [16-20] 20  (11/12 0734) BP: (123-150)/(53-72) 133/72 mmHg (11/12 0734) SpO2:  [91 %-94 %] 91 % (11/12 0734) Weight change:  Last BM Date: 06/05/11  Gen:  NAD HEENT: moist mucous membranes CV: Regular rate and rhythm, no murmurs rubs or gallops PULM: clear to auscultation bilaterally. No wheezes/rales/rhonchi ABD: Diffuse tenderness. No rebound or guarding. Still with bowel sounds.  EXT: No edema Neuro: Alert and oriented x3 Rectal:pad in place-no active bleeding noted.    Lab Results:  Basename 06/06/11 0107 06/05/11 0600  WBC 18.6* 12.5*  HGB 16.4 16.7  HCT 47.0 48.4  PLT 249 247   BMET  Basename 06/05/11 0600 06/04/11 1834  NA 138 135  K 4.3 4.2  CL 105 101  CO2 24 24  GLUCOSE 91 125*  BUN 13 11  CREATININE 0.87 0.83  CALCIUM 9.1 9.9    Studies/Results: US Abdomen Complete  06/05/2011  *RADIOLOGY REPORT*  Clinical Data:  Chronic pancreatitis.  COMPLETE ABDOMINAL ULTRASOUND  Comparison:  CT 02/20/2007, ultrasound 09/27/2008  Findings:  Exam somewhat limited by bowel gas.  Gallbladder:  No gallstones, gallbladder wall thickening, or pericholecystic fluid.  Common bile duct:  Normal at  4 mm.  Liver:  No focal lesion identified.  Within normal limits in parenchymal echogenicity.  IVC:  Appears normal.  Pancreas:  No focal abnormality seen.  Spleen:  Surgically absent.  Right Kidney:  14.0cm in length.  No evidence of hydronephrosis or stones.  Left Kidney:  13.5cm in length.  No evidence of hydronephrosis or stones.  Abdominal aorta:  No aneurysm identified.  IMPRESSION:  No acute findings in the abdomen by ultrasound.  Original Report Authenticated By: Suzy Bouchard, M.D.   Dg Abd Acute W/chest  06/04/2011  *RADIOLOGY REPORT*  Clinical Data: Abdominal pain and distention.  ACUTE ABDOMEN SERIES (ABDOMEN 2 VIEW & CHEST 1 VIEW)  Comparison: 01/06/2009.  Findings: The upright chest x-ray demonstrates chronic lung changes.  No acute pulmonary findings.  Two views of the abdomen demonstrate a large amount of stool in the colon suggesting constipation.  Air filled small bowel loops without significant distention or air fluid levels.  No free air.  IMPRESSION:  1.  Chronic lung changes but no acute pulmonary findings. 2.    No findings for obstruction or perforation.  Possible ileus and constipation.  Original Report Authenticated By: P. Kalman Jewels, M.D.    Medications: I have reviewed the patient's current medications.  Assessment/Plan:  Pt is a 60 year old male with a history of chronic alcohol use, pancreatitis, hyperlipidemia, and diabetes. Pt was recently discharged from this hospital on 05/30/11 after an acute bout of pancreatitis. He presented again on  06/04/11 with abdominal pain and constipation, and was found to have a lipase 778. He remains afebrile with normal, stable vital signs, BMet is WNL, CBC with slightly elevated WBC of 12.5.   Abdominal pain: Likely acute exacerbation of chronic pancreatitis. Lipase elevated on admission. Abdominal XR revealed constipation with a possible ileus. Pt reports last BM 06/02/11. Had been tolerating diet as outpatient until abdominal pain  continued to worsen and he came to the hospital yesterday. On clears this AM.  -Continue dilaudid 1mg  q 3hrs IV for pain. -Lipase tomorrow, am cbc, bmet.  -pain did not improve with enema. Did have stool after enema. Consider repeat KUB.   Pancreatitis -no stones on u/s -no hypercalcemia -recent triglycerides -likely chronic with acute flare-->does smoke which could contribute.  -will hold on statin as class Ia for pancreatitis. Will also avoid tylenol as class II. No other drugs noted that could be contributing.    Rectal bleeding:  Given enema due to possible ileus. Had been on narcotics without bowel regimen. Will hold miralax for now. Consider GI consult if drop in hgb. Will also increase IVF to 150 given loss of fluids. -stat CBC this morning given repeat bleed. Will go see patient once again.     Chronic alcohol use: Pt reports no alcohol use since prior to last hospital admission. Pt on CIWA protocol, was given Ativan x 1 for nausea around time was having rectal bleeding.   -Continue CIWA protocol including thiamine and multivitamin.   Type II DM: Diagnosed during last hospital admission.  -Continue SSI. BS well controlled <120.   Tobacco abuse: Receiving nicotine patch.   Hyperlipidemia: Diagnosed during last hospital admission.  -will consider statin-but can aggrivate pancreatitis.   FEN: Clear liquid diet, NS at 150cc/hr.  PPx: SCDs-no heparin due to bleed.  Protonix 40 po BID   Dispo: Pending evaluation of rectal bleeding and resolution as well as pain control.    [name], [place] 06/06/2011 9:42 AM

## 2011-06-06 NOTE — Progress Notes (Signed)
Notified by nursing that pt has had some BRBPR throughout day. This started after pt strained with bowel movement this am.  A few stools with blood present today.  Also some spotting on blankets and towels. Have d/c'd heparin SQ TID and have placed pt on SCD's.  Will obtain CBC to ensure not significant blood loss.

## 2011-06-06 NOTE — Progress Notes (Signed)
I have seen and examined this patient. I have discussed the evaluation with Dr Yong Channel and the team.  I agree with their findings and plans as documented in their progress note for today. On my exam he had increased bowel sounds and small hemorrhoidal tags with slight bright red blood in the anal folds.

## 2011-06-06 NOTE — Progress Notes (Signed)
Pt had been  having intermittent bleeding all day per rectum.  Noted pt   had another episode of bleeding with  Some     clots passed at 2130  . MD made aware .  HEPARIN  SQ  At 10 pm held. Md visited pt  and CBC ordered. Md made aware of result.Timothy Martinez

## 2011-06-07 LAB — CBC
HCT: 43.4 % (ref 39.0–52.0)
Hemoglobin: 13.8 g/dL (ref 13.0–17.0)
MCH: 29.6 pg (ref 26.0–34.0)
MCHC: 31.8 g/dL (ref 30.0–36.0)
MCV: 93.1 fL (ref 78.0–100.0)
Platelets: 242 10*3/uL (ref 150–400)
RBC: 4.66 MIL/uL (ref 4.22–5.81)
RDW: 13.7 % (ref 11.5–15.5)
WBC: 15.6 10*3/uL — ABNORMAL HIGH (ref 4.0–10.5)

## 2011-06-07 LAB — GLUCOSE, CAPILLARY
Glucose-Capillary: 116 mg/dL — ABNORMAL HIGH (ref 70–99)
Glucose-Capillary: 103 mg/dL — ABNORMAL HIGH (ref 70–99)
Glucose-Capillary: 86 mg/dL (ref 70–99)
Glucose-Capillary: 82 mg/dL (ref 70–99)

## 2011-06-07 LAB — BASIC METABOLIC PANEL
BUN: 6 mg/dL (ref 6–23)
CO2: 25 mEq/L (ref 19–32)
Calcium: 8.7 mg/dL (ref 8.4–10.5)
Chloride: 103 mEq/L (ref 96–112)
Creatinine, Ser: 0.83 mg/dL (ref 0.50–1.35)
GFR calc Af Amer: >90 mL/min (ref >90)
GFR calc non Af Amer: >90 mL/min (ref >90)
Glucose, Bld: 104 mg/dL — ABNORMAL HIGH (ref 70–99)
Potassium: 4 mEq/L (ref 3.5–5.1)
Sodium: 136 mEq/L (ref 135–145)

## 2011-06-07 LAB — LIPASE, BLOOD: Lipase: 104 U/L — ABNORMAL HIGH (ref 11–59)

## 2011-06-07 NOTE — Progress Notes (Signed)
PGY-1 Daily Progress Note Family Medicine Teaching Service Brayton Mars. Melanee Spry, MD Service Pager: [phone]  Subjective: Patient says was having BRBPR throughout the day yesterday with some amount of spotting every 30 minutes. He says this morning the amount has calmed down adn he feels much more comfortable. Has not had a bowel movement since the enema though.  Pain yesterday 9/10 down to 4/10 with dilaudid.-->patient reports exact same pain today  Objective: Vital signs in last 24 hours: Temp:  [98.1 F (36.7 C)-98.9 F (37.2 C)] 98.1 F (36.7 C) (11/13 0541) Pulse Rate:  [80-104] 86  (11/13 0541) Resp:  [18-20] 18  (11/13 0541) BP: (105-134)/(66-80) 124/75 mmHg (11/13 0541) SpO2:  [95 %-97 %] 95 % (11/13 0541) Weight change:  Last BM Date: 06/05/11     Gen:  NAD HEENT: moist mucous membranes CV: Regular rate and rhythm, no murmurs rubs or gallops PULM: clear to auscultation bilaterally. No wheezes/rhonchi. Slight bibasilar crackles.  ABD: Diffuse tenderness. No rebound or guarding. Bowel sounds increased in activity since yesterday.  EXT: No edema Neuro: Alert and oriented x3 Rectal: on exam-opened buttocks wider today-possible hemorrhoid noted but no active bleeding.    Lab Results:  Lab 06/07/11 0624 06/06/11 1635 06/06/11 0950  WBC 15.6* 20.3* 19.3*  HGB 13.8 15.0 15.4  HCT 43.4 43.8 44.3  PLT 242 250 244   BMET  Basename 06/07/11 0624 06/06/11 0950  NA 136 135  K 4.0 4.0  CL 103 102  CO2 25 24  GLUCOSE 104* 147*  BUN 6 10  CREATININE 0.83 0.80  CALCIUM 8.7 8.7   Lipase     Component Value Date/Time   LIPASE 104* 06/07/2011 0624       Studies/Results: Dg Chest 2 View  05/28/2011  *RADIOLOGY REPORT*  Clinical Data: Mid chest pain today.  History of pancreatitis.  CHEST - 2 VIEW  Comparison: The 09/2006 chest radiographs.  Findings: There is chronic lung disease with interstitial prominence and mildly progressive biapical scarring.  No confluent  airspace opacity, edema or pleural effusion is identified.  Heart size and mediastinal contours are stable.  IMPRESSION: Chronic lung disease with mildly progressive biapical scarring.  No acute cardiopulmonary process.  Original Report Authenticated By: Vivia Ewing, M.D.   US Abdomen Complete  06/05/2011  *RADIOLOGY REPORT*  Clinical Data:  Chronic pancreatitis.  COMPLETE ABDOMINAL ULTRASOUND  Comparison:  CT 02/20/2007, ultrasound 09/27/2008  Findings:  Exam somewhat limited by bowel gas.  Gallbladder:  No gallstones, gallbladder wall thickening, or pericholecystic fluid.  Common bile duct:  Normal at 4 mm.  Liver:  No focal lesion identified.  Within normal limits in parenchymal echogenicity.  IVC:  Appears normal.  Pancreas:  No focal abnormality seen.  Spleen:  Surgically absent.  Right Kidney:  14.0cm in length.  No evidence of hydronephrosis or stones.  Left Kidney:  13.5cm in length.  No evidence of hydronephrosis or stones.  Abdominal aorta:  No aneurysm identified.  IMPRESSION:  No acute findings in the abdomen by ultrasound.  Original Report Authenticated By: Suzy Bouchard, M.D.   Dg Abd Acute W/chest  06/04/2011  *RADIOLOGY REPORT*  Clinical Data: Abdominal pain and distention.  ACUTE ABDOMEN SERIES (ABDOMEN 2 VIEW & CHEST 1 VIEW)  Comparison: 01/06/2009.  Findings: The upright chest x-ray demonstrates chronic lung changes.  No acute pulmonary findings.  Two views of the abdomen demonstrate a large amount of stool in the colon suggesting constipation.  Air filled small bowel loops without  significant distention or air fluid levels.  No free air.  IMPRESSION:  1.  Chronic lung changes but no acute pulmonary findings. 2.    No findings for obstruction or perforation.  Possible ileus and constipation.  Original Report Authenticated By: P. Kalman Jewels, M.D.    Assessment/Plan:  Pt is a 60 year old male with a history of chronic alcohol use, pancreatitis, hyperlipidemia, and diabetes.  Pt was recently discharged from this hospital on 05/30/11 after an acute bout of pancreatitis. He presented again on 06/04/11 with abdominal pain and constipation, and was found to have a lipase 778. He remains afebrile with normal, stable vital signs, BMet is WNL, CBC with slightly elevated WBC of 12.5.   Abdominal pain: Likely acute exacerbation of chronic pancreatitis. Lipase elevated on admission to 778 now down to 104.  -hydrating at 150 ml/hr-->reduced to 75 due to crackles -wants to advance diet but still in pain   -Continue dilaudid 1mg  q 3hrs IV for pain-->transition to orals in PM or tomorrow AM.   DDx -also possible related to constipation as Abdominal XR revealed constipation with a possible ileus. Pain did not improve after enema and BM though. Consider repeat KUB today-will discuss with team.  -given white count and continued pain-question possible diverticulitis but doubt dual process of pancreatitis flare and diverticulitis.  Pancreatitis etiology: likely acute on chronic flare related to chronic alcohol use even without acute use-smoking contributing. no stones on u/s. no hypercalcemia. recent triglycerides with levels approx 200.  will hold on statin as class Ia for pancreatitis. Will also avoid tylenol as class II. No other drugs noted that could be contributing.    Rectal bleeding:  Given enema due to possible ileus and this seems to be source. hgb down 1.6 from yesterday but increased hydration and patient eating most of meal. Hgb still in safe range. Will monito-consider pm check with definite check for AM.  Protonix for ppx but doubt upper bleed. Holding heparin.   Lab 06/07/11 0624 06/06/11 1635 06/06/11 0950  WBC 15.6* 20.3* 19.3*  HGB 13.8 15.0 15.4  HCT 43.4 43.8 44.3  PLT 242 250 244    Lab Results  Component Value Date   INR 1.16 06/06/2011    Constipation: consider repeat KUB, given possible ileus-concerned to give anything PO, but given GI bleed, do not want to  give enema.   Holding miralax and enemas at this time.   Leukocytosis-unaware of origin but currently trending down.    Chronic alcohol use: Pt reports no alcohol use since prior to last hospital admission. Pt on CIWA protocol, was given Ativan x 1 for nausea around time was having rectal bleeding.   -Continue CIWA protocol including thiamine and multivitamin.   Type II DM: Diagnosed during last hospital admission.  -Continue SSI. BS well controlled <150. 3 units SSI  Tobacco abuse: Receiving nicotine patch. Already counseled about quitting  Hyperlipidemia: Diagnosed during last hospital admission.  -will consider statin-but can aggrivate pancreatitis.   FEN: Clear liquid diet, NS at 150cc/hr.-->decrease to 75 per hour. Will not advance diet until pain improved.   PPx: SCDs-no heparin due to bleed.  Protonix 40 po BID   Dispo: Pending evaluation of rectal bleeding and resolution as well as pain control.    [name], [place] 06/07/2011 7:48 AM

## 2011-06-07 NOTE — Progress Notes (Signed)
I interviewed and examined this patient and discussed the care plan with Dr.Hunter and agree with assessment and plan as documented in their progress note for today. At the time of my visit in the afternoon, he was using the toilet and reported passing soft stool without blood.     [place] A. Walker Kehr, MD Family Medicine Teaching Service Attending  06/07/2011 5:31 PM

## 2011-06-08 ENCOUNTER — Inpatient Hospital Stay: Payer: Medicare Other | Admitting: Family Medicine

## 2011-06-08 LAB — BASIC METABOLIC PANEL
BUN: 6 mg/dL (ref 6–23)
CO2: 26 mEq/L (ref 19–32)
Calcium: 9 mg/dL (ref 8.4–10.5)
Chloride: 102 mEq/L (ref 96–112)
Creatinine, Ser: 0.77 mg/dL (ref 0.50–1.35)
GFR calc Af Amer: >90 mL/min (ref >90)
GFR calc non Af Amer: >90 mL/min (ref >90)
Glucose, Bld: 103 mg/dL — ABNORMAL HIGH (ref 70–99)
Potassium: 3.7 mEq/L (ref 3.5–5.1)
Sodium: 137 mEq/L (ref 135–145)

## 2011-06-08 LAB — CBC
HCT: 45.6 % (ref 39.0–52.0)
Hemoglobin: 15.5 g/dL (ref 13.0–17.0)
MCH: 31.7 pg (ref 26.0–34.0)
MCHC: 34 g/dL (ref 30.0–36.0)
MCV: 93.3 fL (ref 78.0–100.0)
Platelets: 311 10*3/uL (ref 150–400)
RBC: 4.89 MIL/uL (ref 4.22–5.81)
RDW: 13.6 % (ref 11.5–15.5)
WBC: 13.8 10*3/uL — ABNORMAL HIGH (ref 4.0–10.5)

## 2011-06-08 LAB — GLUCOSE, CAPILLARY
Glucose-Capillary: 89 mg/dL (ref 70–99)
Glucose-Capillary: 110 mg/dL — ABNORMAL HIGH (ref 70–99)
Glucose-Capillary: 82 mg/dL (ref 70–99)

## 2011-06-08 MED ORDER — OXYCODONE HCL 5 MG PO TABS
10.0000 mg | ORAL_TABLET | ORAL | Status: DC | PRN
  Administered 2011-06-08 – 2011-06-09 (×9): 10 mg via ORAL
  Filled 2011-06-08 (×9): qty 2

## 2011-06-08 NOTE — Progress Notes (Signed)
PGY-1 Daily Progress Note Family Medicine Teaching Service Brayton Mars. Melanee Spry, MD Service Pager: [phone]  Subjective: Patient without BRBPR for 24 hours. Pain down to 8/10 without dilaudid and 2/10 with dilaudid. Ready to transition to orals. Would like to make sure his pain is controlled on orals before d/c.   Did have a BM yesterday without blood.    Objective: Vital signs in last 24 hours: Temp:  [98.1 F (36.7 C)-98.5 F (36.9 C)] 98.1 F (36.7 C) (11/14 0600) Pulse Rate:  [85-94] 85  (11/14 0600) Resp:  [18] 18  (11/14 0600) BP: (114-119)/(66-73) 114/73 mmHg (11/14 0600) SpO2:  [90 %-96 %] 96 % (11/14 0600) Weight change:  Last BM Date: 06/07/11     Gen:  NAD HEENT: moist mucous membranes CV: Regular rate and rhythm, no murmurs rubs or gallops PULM: clear to auscultation bilaterally. No wheezes/rhonchi.  ABD: Diffuse tenderness. No rebound or guarding. Bowel sounds slightly increased EXT: No edema Neuro: Alert and oriented x3 Rectal: did not examine today.    Lab Results:  Lab 06/08/11 0619 06/07/11 0624 06/06/11 1635  WBC 13.8* 15.6* 20.3*  HGB 15.5 13.8 15.0  HCT 45.6 43.4 43.8  PLT 311 242 250   BMET  Basename 06/08/11 0619 06/07/11 0624  NA 137 136  K 3.7 4.0  CL 102 103  CO2 26 25  GLUCOSE 103* 104*  BUN 6 6  CREATININE 0.77 0.83  CALCIUM 9.0 8.7   Lipase     Component Value Date/Time   LIPASE 104* 06/07/2011 0624    Studies/Results: Dg Chest 2 View  05/28/2011  *RADIOLOGY REPORT*  Clinical Data: Mid chest pain today.  History of pancreatitis.  CHEST - 2 VIEW  Comparison: The 09/2006 chest radiographs.  Findings: There is chronic lung disease with interstitial prominence and mildly progressive biapical scarring.  No confluent airspace opacity, edema or pleural effusion is identified.  Heart size and mediastinal contours are stable.  IMPRESSION: Chronic lung disease with mildly progressive biapical scarring.  No acute cardiopulmonary  process.  Original Report Authenticated By: Vivia Ewing, M.D.   US Abdomen Complete  06/05/2011  *RADIOLOGY REPORT*  Clinical Data:  Chronic pancreatitis.  COMPLETE ABDOMINAL ULTRASOUND  Comparison:  CT 02/20/2007, ultrasound 09/27/2008  Findings:  Exam somewhat limited by bowel gas.  Gallbladder:  No gallstones, gallbladder wall thickening, or pericholecystic fluid.  Common bile duct:  Normal at 4 mm.  Liver:  No focal lesion identified.  Within normal limits in parenchymal echogenicity.  IVC:  Appears normal.  Pancreas:  No focal abnormality seen.  Spleen:  Surgically absent.  Right Kidney:  14.0cm in length.  No evidence of hydronephrosis or stones.  Left Kidney:  13.5cm in length.  No evidence of hydronephrosis or stones.  Abdominal aorta:  No aneurysm identified.  IMPRESSION:  No acute findings in the abdomen by ultrasound.  Original Report Authenticated By: Suzy Bouchard, M.D.   Dg Abd Acute W/chest  06/04/2011  *RADIOLOGY REPORT*  Clinical Data: Abdominal pain and distention.  ACUTE ABDOMEN SERIES (ABDOMEN 2 VIEW & CHEST 1 VIEW)  Comparison: 01/06/2009.  Findings: The upright chest x-ray demonstrates chronic lung changes.  No acute pulmonary findings.  Two views of the abdomen demonstrate a large amount of stool in the colon suggesting constipation.  Air filled small bowel loops without significant distention or air fluid levels.  No free air.  IMPRESSION:  1.  Chronic lung changes but no acute pulmonary findings. 2.    No  findings for obstruction or perforation.  Possible ileus and constipation.  Original Report Authenticated By: P. Kalman Jewels, M.D.    Assessment/Plan:  Pt is a 60 year old male with a history of chronic alcohol use, pancreatitis, hyperlipidemia, and diabetes. Pt was recently discharged from this hospital on 05/30/11 after an acute bout of pancreatitis. He presented again on 06/04/11 with abdominal pain and constipation, and was found to have a lipase 778 which has now  trended down to appoximately 100.   Abdominal pain: Likely acute exacerbation of chronic pancreatitis. Lipase elevated on admission to 778 now down to 104.  -will advance diet when pain <5 continuously.  -switch to oxycodone 10 q3 today with plan for discharge of 10 q1.  -fluids to 14ml/hr as taking PO.   Pancreatitis etiology: likely acute on chronic flare related to chronic alcohol use even without acute use-smoking contributing. no stones on u/s. no hypercalcemia. recent triglycerides with levels approx 200.  will hold on statin as class Ia for pancreatitis. Will also avoid tylenol as class II. No other drugs noted that could be contributing.    Rectal bleeding: resolved with stable hgb. Will monitor with one more cbc.   Given enema due to possible ileus and this seems to be source  Protonix for ppx but doubt upper bleed. Holding heparin. INR not elevated  Lab 06/08/11 0619 06/07/11 0624 06/06/11 1635  WBC 13.8* 15.6* 20.3*  HGB 15.5 13.8 15.0  HCT 45.6 43.4 43.8  PLT 311 242 250    Lab Results  Component Value Date   INR 1.16 06/06/2011   Leukocytosis-unaware of origin but currently trending down.    Chronic alcohol use: Pt reports no alcohol use since prior to last hospital admission. Pt on CIWA protocol, was given Ativan x 1 for nausea around time was having rectal bleeding.  No more ativan. Believe patient has actually quit.  -Continue CIWA protocol including thiamine oral and multivitamin.   Type II DM: Diagnosed during last hospital admission.  -will stop SSI but watch BS as last 3<90.  -will get diabetes educator for lifestyle modification.   Basename 06/08/11 0607 06/07/11 2151 06/07/11 1614  GLUCAP 89 82 86    Tobacco abuse: Receiving nicotine patch. Already counseled about quitting  Hyperlipidemia: Diagnosed during last hospital admission.  -will consider statin-but can aggrivate pancreatitis.   FEN: Clear liquid diet, NS at  50/hr. Will not advance diet until  pain consistently <5.   PPx: SCDs-no heparin due to bleed.  Protonix 40 po BID. Miralax for constipation on narcotics.   Dispo: likely d/c on 11/15 once patient knows pain can be controlled by orals and no more rectal bleeding.    [name], [place] 06/08/2011 10:02 AM

## 2011-06-08 NOTE — Progress Notes (Signed)
I interviewed and examined this patient and discussed the care plan with Dr. Yong Channel and the [facility] team and agree with assessment and plan as documented in the progress note for today.Patrick Jupiter A. Walker Kehr, MD Family Medicine Teaching Service Attending  06/08/2011 6:28 PM

## 2011-06-09 DIAGNOSIS — F1011 Alcohol abuse, in remission: Secondary | ICD-10-CM

## 2011-06-09 DIAGNOSIS — K625 Hemorrhage of anus and rectum: Secondary | ICD-10-CM

## 2011-06-09 LAB — GLUCOSE, CAPILLARY
Glucose-Capillary: 111 mg/dL — ABNORMAL HIGH (ref 70–99)
Glucose-Capillary: 100 mg/dL — ABNORMAL HIGH (ref 70–99)

## 2011-06-09 LAB — CBC
HCT: 45.2 % (ref 39.0–52.0)
Hemoglobin: 15 g/dL (ref 13.0–17.0)
MCH: 30.6 pg (ref 26.0–34.0)
MCHC: 33.2 g/dL (ref 30.0–36.0)
MCV: 92.2 fL (ref 78.0–100.0)
Platelets: 320 10*3/uL (ref 150–400)
RBC: 4.9 MIL/uL (ref 4.22–5.81)
RDW: 13.6 % (ref 11.5–15.5)
WBC: 12.8 10*3/uL — ABNORMAL HIGH (ref 4.0–10.5)

## 2011-06-09 MED ORDER — POLYETHYLENE GLYCOL 3350 17 G PO PACK: 17.0000 g | PACK | Freq: Every day | ORAL | Status: AC

## 2011-06-09 MED ORDER — ONDANSETRON HCL 4 MG PO TABS: 4.0000 mg | ORAL_TABLET | Freq: Four times a day (QID) | ORAL | Status: DC | PRN

## 2011-06-09 MED ORDER — OXYCODONE HCL 10 MG PO TABS: 5.0000 mg | ORAL_TABLET | ORAL | Status: DC | PRN

## 2011-06-09 MED ORDER — THIAMINE HCL 100 MG PO TABS: 100.0000 mg | ORAL_TABLET | Freq: Every day | ORAL | Status: DC

## 2011-06-09 MED ORDER — THERA M PLUS PO TABS: 1.0000 | ORAL_TABLET | Freq: Every day | ORAL | Status: DC

## 2011-06-09 MED ORDER — POLYETHYLENE GLYCOL 3350 17 G PO PACK
17.0000 g | PACK | Freq: Every day | ORAL | Status: DC
  Filled 2011-06-09: qty 1

## 2011-06-09 MED ORDER — OXYCODONE HCL 10 MG PO TABS: 5.0000 mg | ORAL_TABLET | ORAL | Status: AC | PRN

## 2011-06-09 MED ORDER — FOLIC ACID 1 MG PO TABS: 1.0000 mg | ORAL_TABLET | Freq: Every day | ORAL | Status: DC

## 2011-06-09 NOTE — Discharge Summary (Signed)
Physician Discharge Summary  Patient ID: Timothy Martinez [id number] 12-26-1950 60 y.o.  Admit date: 06/04/2011 Discharge date: 06/09/2011  PCP: No primary provider on file. Timothy Martinez currently  Discharge Diagnosis: Primary 1. Acute on chronic alcoholic pancreatitis 2. Rectal Bleeding with possible hemorrhoids after enema Secondary . Diabetes mellitus type II, a1c 6.8  . Chronic alcoholic pancreatitis  . Hyperlipidemia  . Tobacco abuse  . History of alcohol abuse  . Rectal bleeding     Hospital Course Pt is a 60 year old male with a history of chronic alcohol use, pancreatitis, hyperlipidemia, and diabetes. Pt was recently discharged from this hospital on 05/30/11 after an acute bout of pancreatitis. He presented again on 06/04/11 with abdominal pain and constipation, and was found to have a lipase 778 which has now trended down to appoximately 100.  1. Abdominal pain: Likely acute exacerbation of chronic pancreatitis due to chronic alcohol use with smoking contributing. Abd u/s without gallstones.  Lipase elevated on admission to 778 now down to 104. Initially required dilaudid q3 with moderate control. Switched to oxycodone 5-10mg  at discharge q4 prn. Miralax to prevent constipation -on clear liquids during hospitalization. Advanced to softs on day of discharge. Patient knows to add foods slowly when pain <5.   -Other: no hypercalcemia. recent triglycerides with levels approx 200. will hold on statin as class Ia for pancreatitis. Will also avoid tylenol as class II. No other drugs noted that could be contributing.  2. Rectal bleeding:Given enema due to possible ileus at time of admission and and this caused a steady stream of rectal bleeding for 24 hours. Some hemorrhoidal tags noted. Likely irritated a hemorrhoid or vessel with enema. Started Protonix but did not suspect upper GI bleed. Held heparin. INR not elevated. Hgb steady during bleeding. Resolved and did nto recur with stable hgb  throughout stay.   Lab 06/09/11 0540 06/08/11 0619 06/07/11 0624  WBC 12.8* 13.8* 15.6*  HGB 15.0 15.5 13.8  HCT 45.2 45.6 43.4  PLT 320 311 242   Lab Results   Component  Value  Date    INR  1.16  06/06/2011    3. Leukocytosis-unaware of origin but trended down throughout stay.  4. Chronic alcohol use: Pt has quit since last hospitalization. On [place], received 1 ativan but this was more due to anxiety from rectal bleeding. Congratulated on cessation. Did give patient thiamine and multivitamin in hospital and at discharge.  5. Type II DM: Diagnosed during last hospital admission. Possible related to pancrease inflammation -on SSI in house and steady so SSI stopped.  -ordered DM education but did not receive. Will get outpatient followup 6. Tobacco abuse: Receiving nicotine patch. counseled about quitting  And how this could help pancreas. Did not want patch at d/c.  7. Hyperlipidemia: Diagnosed during last hospital admission.  -will consider statin-but can aggrivate pancreatitis.    Procedures/Imaging:  Dg Chest 2 View  05/28/2011  *RADIOLOGY REPORT*  Clinical Data: Mid chest pain today.  History of pancreatitis.  CHEST - 2 VIEW  Comparison: The 09/2006 chest radiographs.  Findings: There is chronic lung disease with interstitial prominence and mildly progressive biapical scarring.  No confluent airspace opacity, edema or pleural effusion is identified.  Heart size and mediastinal contours are stable.  IMPRESSION: Chronic lung disease with mildly progressive biapical scarring.  No acute cardiopulmonary process.  Original Report Authenticated By: Vivia Ewing, M.D.   US Abdomen Complete  06/05/2011  *RADIOLOGY REPORT*  Clinical Data:  Chronic pancreatitis.  COMPLETE ABDOMINAL ULTRASOUND  Comparison:  CT 02/20/2007, ultrasound 09/27/2008  Findings:  Exam somewhat limited by bowel gas.  Gallbladder:  No gallstones, gallbladder wall thickening, or pericholecystic fluid.  Common bile duct:   Normal at 4 mm.  Liver:  No focal lesion identified.  Within normal limits in parenchymal echogenicity.  IVC:  Appears normal.  Pancreas:  No focal abnormality seen.  Spleen:  Surgically absent.  Right Kidney:  14.0cm in length.  No evidence of hydronephrosis or stones.  Left Kidney:  13.5cm in length.  No evidence of hydronephrosis or stones.  Abdominal aorta:  No aneurysm identified.  IMPRESSION:  No acute findings in the abdomen by ultrasound.  Original Report Authenticated By: Suzy Bouchard, M.D.   Dg Abd Acute W/chest  06/04/2011  *RADIOLOGY REPORT*  Clinical Data: Abdominal pain and distention.  ACUTE ABDOMEN SERIES (ABDOMEN 2 VIEW & CHEST 1 VIEW)  Comparison: 01/06/2009.  Findings: The upright chest x-ray demonstrates chronic lung changes.  No acute pulmonary findings.  Two views of the abdomen demonstrate a large amount of stool in the colon suggesting constipation.  Air filled small bowel loops without significant distention or air fluid levels.  No free air.  IMPRESSION:  1.  Chronic lung changes but no acute pulmonary findings. 2.    No findings for obstruction or perforation.  Possible ileus and constipation.  Original Report Authenticated By: P. Kalman Jewels, M.D.    Labs  CBC  Lab 06/09/11 0540 06/08/11 0619 06/07/11 0624  WBC 12.8* 13.8* 15.6*  HGB 15.0 15.5 13.8  HCT 45.2 45.6 43.4  PLT 320 311 242   BMET  Lab 06/08/11 0619 06/07/11 0624 06/06/11 0950 06/04/11 1834  NA 137 136 135 --  K 3.7 4.0 4.0 --  CL 102 103 102 --  CO2 26 25 24  --  BUN 6 6 10  --  CREATININE 0.77 0.83 0.80 --  CALCIUM 9.0 8.7 8.7 --  PROT -- -- -- 7.8  BILITOT -- -- -- 0.5  ALKPHOS -- -- -- 92  ALT -- -- -- 34  AST -- -- -- 33  GLUCOSE 103* 104* 147* --        Patient condition at time of discharge/disposition: stable  Disposition-home   Follow up issues: 1. Follow up pain control and slow advancement of diet.  2. Hyperlipidemia+ DM II + smoking/CVD prevention. Pravastatin 40  discussed with pharmacy as likely best option but this could potentially aggravate pancreatitis as it is a class 1a medicine. Patient will received instructions for lifestyle modifications with plan for close outpatient follow up. Patient says he is committed to walking and improving diet.    -I would recommend a repeat a1c in 3 months. Patient would also benefit from diabetic education outpatient.   - Consider primary prevention with aspirin 81mg  but did not start in hospital due to rectal bleeding.   -Patient not currently interested in quitting smoking but going to consider meeting with Dr. Valentina Lucks.  -Follow up to make sure patient continues to abstain from alcohol. 3. Repeat CBC if patient has had any more rectal bleeding.  4. [name], MD is happy to become PCP.      Discharge exam Temp:  [98 F (36.7 C)-98.8 F (37.1 C)] 98 F (36.7 C) (11/15 0600) Pulse Rate:  [82-100] 100  (11/15 0600) Resp:  [16-18] 18  (11/15 0600) BP: (109-117)/(59-71) 112/71 mmHg (11/15 0600) SpO2:  [93 %-96 %] 93 % (11/15 0600)   Gen: NAD  HEENT: moist mucous membranes  CV: Regular rate and rhythm, no murmurs rubs or gallops  PULM: clear to auscultation bilaterally. No wheezes/rhonchi.  ABD: Diffuse tenderness. No rebound or guarding. Bowel sounds normal EXT: No edema  Neuro: Alert and oriented x3  Rectal: did not examine today.    Discharge follow up:  Discharge Orders    Future Appointments: Provider: Department: Dept Phone: Center:   06/24/2011 2:30 PM Dorthey Sawyer [facility] Res[facility] [phone] [facility]     Future Orders Please Complete By Expires   Diet - low sodium heart healthy      Scheduling Instructions:   See other section for ideas on foods to eat. I want you to SLOWLY advance your diet only if pain <5 persistently for a day-1 new soft food at a time for 2 weeks.   Increase activity slowly      Driving Restrictions      Comments:   Do not drive while taking pain medicine.   Call  MD for:  persistant nausea and vomiting      Call MD for:  severe uncontrolled pain         Discharge Medications   Home Medication Instructions    Medication Information      folic acid (FOLVITE) 1 MG tablet Take 1 tablet (1 mg total) by mouth daily.    Multiple Vitamins-Minerals (MULTIVITAMINS THER. W/MINERALS) TABS Take 1 tablet by mouth daily.    ondansetron (ZOFRAN) 4 MG tablet Take 1 tablet (4 mg total) by mouth every 6 (six) hours as needed for nausea.    oxyCODONE 10 MG TABS Take 0.5-1 tablets (5-10 mg total) by mouth every 4 (four) hours as needed for pain.  Given 21  polyethylene glycol (MIRALAX / GLYCOLAX) packet Take 17 g by mouth daily.    thiamine 100 MG tablet Take 1 tablet (100 mg total) by mouth daily.       [name], MD of [facility] 06/09/2011 9:58 AM

## 2011-06-09 NOTE — Discharge Summary (Signed)
I interviewed and examined this patient and discussed the care plan with Dr. Yong Channel and the [facility] team and agree with assessment and plan as documented in the discharge note for today.Patrick Jupiter A. Walker Kehr, MD Family Medicine Teaching Service Attending  06/09/2011 4:08 PM

## 2011-06-13 ENCOUNTER — Telehealth: Payer: Self-pay | Admitting: *Deleted

## 2011-06-13 NOTE — Telephone Encounter (Signed)
Patient calls stating at discharge of first hospitalization  He was given Oxycodone 5 mg . Then had to go back in hospital a few days later . At that discharge he was given Oxycodone 10 mg. States his pain is much better and he only has to take the pain med once a day usually in the afternoon.Marland Kitchen He feels the 10 mg tab does not work as well as the 5 mg tab. Rates pain as 4 out of 10.  States he has 18 tab left  Called pharmacy and verified that he had gotten the correct tabs.  Consulted Dr. Andria Frames  and he advises patient may take 1/2 tab every 6 hours instead of just one tab  to see if this helps more.  Patient states he is not taking ibuprofen or drinking any alcohol.  Has follow up appointment 11/30

## 2011-06-24 ENCOUNTER — Ambulatory Visit (INDEPENDENT_AMBULATORY_CARE_PROVIDER_SITE_OTHER): Payer: Medicare Other | Admitting: Family Medicine

## 2011-06-24 ENCOUNTER — Encounter: Payer: Self-pay | Admitting: Family Medicine

## 2011-06-24 VITALS — BP 125/77 | HR 92 | Ht 75.0 in | Wt 201.5 lb

## 2011-06-24 DIAGNOSIS — K861 Other chronic pancreatitis: Secondary | ICD-10-CM

## 2011-06-24 DIAGNOSIS — F17200 Nicotine dependence, unspecified, uncomplicated: Secondary | ICD-10-CM

## 2011-06-24 DIAGNOSIS — K860 Alcohol-induced chronic pancreatitis: Secondary | ICD-10-CM

## 2011-06-24 DIAGNOSIS — K625 Hemorrhage of anus and rectum: Secondary | ICD-10-CM

## 2011-06-24 DIAGNOSIS — Z720 Tobacco use: Secondary | ICD-10-CM

## 2011-06-24 DIAGNOSIS — E119 Type 2 diabetes mellitus without complications: Secondary | ICD-10-CM

## 2011-06-24 DIAGNOSIS — F1011 Alcohol abuse, in remission: Secondary | ICD-10-CM

## 2011-06-24 DIAGNOSIS — Z23 Encounter for immunization: Secondary | ICD-10-CM

## 2011-06-24 MED ORDER — OXYCODONE HCL 5 MG PO CAPS: 5.0000 mg | ORAL_CAPSULE | ORAL | Status: AC | PRN

## 2011-06-24 NOTE — Assessment & Plan Note (Signed)
No further rectal bleeding at home. Per patient this was an isolated event that occurred in the hospital status post enema.

## 2011-06-24 NOTE — Assessment & Plan Note (Signed)
Gave patient a prescription for Roxicodone 5 mg-30 tablets-  to be used every 4 hours when necessary. Discuss with patient that the goal is to taper off this medicine as soon as possible. Patient has decreased the amount used since discharge from hospital. We'll continue to monitor patient's pain. Patient to followup in 2-3 months or sooner if pain worsens.

## 2011-06-24 NOTE — Assessment & Plan Note (Addendum)
No alcohol use since 05/27/11. Encouraged patient to continue alcohol cessation. Patient states that family and friends are supportive. Patient states that he will return to see PCP if any temptation to drink reoccurs so they he can be referred to appropriate resources.

## 2011-06-24 NOTE — Patient Instructions (Signed)
Pancreatitis: Eat a healthy complete diet. Avoid fried/fatty foods.  Try to decrease percocet- use as little as possible.  Our goal is to discontinue this as soon as possible.   Diabetes: We will recheck your A1C in 3 months.   Weight loss: Keep up the great work with your exercise!!!  Continue healthy diet.  Alcohol: Great job with quitting.  If you are tempted to drink or begin to struggle- please make an appt to come in and discuss plan with Dr. Yong Channel.  Smoking: Continue to decrease cigarette---remember, Dr. Valentina Lucks, pharamcist would be glad to meet with you.

## 2011-06-24 NOTE — Assessment & Plan Note (Signed)
Will recheck patient A1c at followup appointment in 2-3 months.

## 2011-06-24 NOTE — Assessment & Plan Note (Signed)
Has decreased from 2 packs to one pack per day. Patient states that he plans on purchase electronic cigarette. Aware of clinic resources for smoking cessation-states he was no if he would like to meet with pharmacist.

## 2011-06-24 NOTE — Progress Notes (Signed)
  Subjective:    Patient ID: Timothy Martinez, male    DOB: [birth year], 60 y.o.   MRN: [id number]  HPI Followup hospitalization pancreatitis: Patient states that pain has slowly improved since discharge, pain does occur sometimes with movement and with activity. Usually a half tablet of Percocet helps get him through the rest the day. Has tried to decrease the amount of Percocet he is using. Currently uses a possibly one half tab 1-2 times per day. Pain currently 4/10 in epigastric area. No nausea. No vomiting.  Alcohol use: Patient denies any alcohol use since November 2. Patient states he doesn't even desire to drink after all he has gone through. Does not plan on ever drinking again.  Cigarette use: Patient has decreased cigarette use from 2-1/2 packs to one pack per day. Patient plans to purchase electronic cigarette to help him decrease cigarette smoke further.   Exercise/weight loss:  Patient states that he has been adherent to healthy diet since time of discharge. Patient is walking 15-30 minutes per day and states he feels much better. Patient states he's lost approximately 20 pounds during the past month-between hospitalization and his weight loss efforts.  Rectal bleeding: No further rectal bleeding at home. Per patient this was an isolated event that occurred in the hospital status post enema.   Review of Systems As per above. No chest pain. No shortness of breath. No lower extremity edema.    Objective:   Physical Exam  Constitutional: He is oriented to person, place, and time. He appears well-developed and well-nourished.  HENT:  Head: Atraumatic.  Cardiovascular: Normal rate and regular rhythm.   No murmur heard. Pulmonary/Chest: Effort normal. No respiratory distress. He has no wheezes.  Abdominal: Soft. He exhibits no distension. There is tenderness. There is no rebound and no guarding.       Surgical scar across mid abdomen. Positive tenderness to palpation in epigastric  area. No rebound. No guarding. Positive bowel sounds.  Musculoskeletal: He exhibits no edema.  Neurological: He is alert and oriented to person, place, and time.  Skin: No rash noted.  Psychiatric: He has a normal mood and affect. His behavior is normal.          Assessment & Plan:

## 2012-02-17 ENCOUNTER — Emergency Department (HOSPITAL_COMMUNITY): Payer: Medicare Other

## 2012-02-17 ENCOUNTER — Encounter (HOSPITAL_COMMUNITY): Payer: Self-pay | Admitting: Emergency Medicine

## 2012-02-17 ENCOUNTER — Inpatient Hospital Stay (HOSPITAL_COMMUNITY)
Admission: EM | Admit: 2012-02-17 | Discharge: 2012-02-21 | DRG: 440 | Disposition: A | Discharge status: Home / Self Care | Payer: Medicare Other | Attending: Family Medicine | Admitting: Family Medicine

## 2012-02-17 DIAGNOSIS — F1020 Alcohol dependence, uncomplicated: Secondary | ICD-10-CM | POA: Diagnosis present

## 2012-02-17 DIAGNOSIS — K861 Other chronic pancreatitis: Secondary | ICD-10-CM | POA: Diagnosis present

## 2012-02-17 DIAGNOSIS — F1011 Alcohol abuse, in remission: Secondary | ICD-10-CM

## 2012-02-17 DIAGNOSIS — F17200 Nicotine dependence, unspecified, uncomplicated: Secondary | ICD-10-CM | POA: Diagnosis present

## 2012-02-17 DIAGNOSIS — K859 Acute pancreatitis without necrosis or infection, unspecified: Principal | ICD-10-CM | POA: Diagnosis present

## 2012-02-17 DIAGNOSIS — E785 Hyperlipidemia, unspecified: Secondary | ICD-10-CM | POA: Diagnosis present

## 2012-02-17 DIAGNOSIS — E119 Type 2 diabetes mellitus without complications: Secondary | ICD-10-CM | POA: Diagnosis present

## 2012-02-17 DIAGNOSIS — Z720 Tobacco use: Secondary | ICD-10-CM | POA: Diagnosis present

## 2012-02-17 DIAGNOSIS — R109 Unspecified abdominal pain: Secondary | ICD-10-CM | POA: Diagnosis not present

## 2012-02-17 DIAGNOSIS — K2970 Gastritis, unspecified, without bleeding: Secondary | ICD-10-CM | POA: Diagnosis not present

## 2012-02-17 DIAGNOSIS — R1012 Left upper quadrant pain: Secondary | ICD-10-CM | POA: Diagnosis not present

## 2012-02-17 HISTORY — DX: Angina pectoris, unspecified: I20.9

## 2012-02-17 HISTORY — DX: Pneumonia, unspecified organism: J18.9

## 2012-02-17 HISTORY — DX: Gastro-esophageal reflux disease without esophagitis: K21.9

## 2012-02-17 HISTORY — DX: Unspecified osteoarthritis, unspecified site: M19.90

## 2012-02-17 LAB — COMPREHENSIVE METABOLIC PANEL
ALT: 18 U/L (ref 0–53)
AST: 25 U/L (ref 0–37)
Albumin: 4.2 g/dL (ref 3.5–5.2)
Alkaline Phosphatase: 79 U/L (ref 39–117)
BUN: 8 mg/dL (ref 6–23)
CO2: 27 mEq/L (ref 19–32)
Calcium: 9.8 mg/dL (ref 8.4–10.5)
Chloride: 95 mEq/L — ABNORMAL LOW (ref 96–112)
Creatinine, Ser: 0.83 mg/dL (ref 0.50–1.35)
GFR calc Af Amer: >90 mL/min (ref >90)
GFR calc non Af Amer: >90 mL/min (ref >90)
Glucose, Bld: 119 mg/dL — ABNORMAL HIGH (ref 70–99)
Potassium: 5 mEq/L (ref 3.5–5.1)
Sodium: 134 mEq/L — ABNORMAL LOW (ref 135–145)
Total Bilirubin: 0.8 mg/dL (ref 0.3–1.2)
Total Protein: 8.9 g/dL — ABNORMAL HIGH (ref 6.0–8.3)

## 2012-02-17 LAB — CBC
HCT: 53.9 % — ABNORMAL HIGH (ref 39.0–52.0)
Hemoglobin: 18.7 g/dL — ABNORMAL HIGH (ref 13.0–17.0)
MCH: 32 pg (ref 26.0–34.0)
MCHC: 34.7 g/dL (ref 30.0–36.0)
MCV: 92.3 fL (ref 78.0–100.0)
Platelets: 226 10*3/uL (ref 150–400)
RBC: 5.84 MIL/uL — ABNORMAL HIGH (ref 4.22–5.81)
RDW: 14.6 % (ref 11.5–15.5)
WBC: 14.8 10*3/uL — ABNORMAL HIGH (ref 4.0–10.5)

## 2012-02-17 LAB — LIPASE, BLOOD: Lipase: 169 U/L — ABNORMAL HIGH (ref 11–59)

## 2012-02-17 MED ORDER — SUCRALFATE 1 G PO TABS
1.0000 g | ORAL_TABLET | Freq: Three times a day (TID) | ORAL | Status: DC
  Administered 2012-02-17 – 2012-02-21 (×14): 1 g via ORAL
  Filled 2012-02-17 (×19): qty 1

## 2012-02-17 MED ORDER — SODIUM CHLORIDE 0.9 % IV BOLUS (SEPSIS)
1000.0000 mL | Freq: Once | INTRAVENOUS | Status: AC
  Administered 2012-02-17: 1000 mL via INTRAVENOUS

## 2012-02-17 MED ORDER — HYDROMORPHONE HCL PF 1 MG/ML IJ SOLN
1.0000 mg | Freq: Once | INTRAMUSCULAR | Status: AC
  Administered 2012-02-17: 1 mg via INTRAVENOUS
  Filled 2012-02-17: qty 1

## 2012-02-17 MED ORDER — SODIUM CHLORIDE 0.9 % IV SOLN
Freq: Once | INTRAVENOUS | Status: AC
  Administered 2012-02-17: 21:00:00 via INTRAVENOUS

## 2012-02-17 MED ORDER — PANTOPRAZOLE SODIUM 40 MG IV SOLR
40.0000 mg | Freq: Once | INTRAVENOUS | Status: AC
  Administered 2012-02-17: 40 mg via INTRAVENOUS
  Filled 2012-02-17: qty 40

## 2012-02-17 MED ORDER — GI COCKTAIL ~~LOC~~
30.0000 mL | Freq: Once | ORAL | Status: AC
  Administered 2012-02-17: 30 mL via ORAL
  Filled 2012-02-17: qty 30

## 2012-02-17 MED ORDER — ONDANSETRON HCL 4 MG/2ML IJ SOLN
4.0000 mg | Freq: Once | INTRAMUSCULAR | Status: AC
  Administered 2012-02-17: 4 mg via INTRAVENOUS
  Filled 2012-02-17: qty 2

## 2012-02-17 NOTE — ED Notes (Signed)
Patient currently sitting up in bed; no respiratory or acute distress noted.  Patient requesting Dilaudid at this time; updated patient on plan of care; informed patient that EDP has ordered Protonix, GI Cocktail, and Carafate for his abdominal pain at this time.  Patient has no other questions or concerns; will continue to monitor.

## 2012-02-17 NOTE — ED Notes (Signed)
Dr Allen at bedside  

## 2012-02-17 NOTE — ED Notes (Signed)
RN at bedside attempting IV access.

## 2012-02-17 NOTE — ED Notes (Signed)
Reports LLQ pain and nausea since last night.  States pain now radiating to mid upper abd.   Denies vomiting and diarrhea.

## 2012-02-17 NOTE — ED Provider Notes (Signed)
History     CSN: [id number]  Arrival date & time 02/17/12  1717   First MD Initiated Contact with Patient 02/17/12 2007      Chief Complaint  Patient presents with  . Abdominal Pain    (Consider location/radiation/quality/duration/timing/severity/associated sxs/prior treatment) Patient is a 61 y.o. male presenting with abdominal pain. The history is provided by the patient.  Abdominal Pain The primary symptoms of the illness include abdominal pain.   patient here with 24-hour history of abdominal pain localized to his left lower quadrant with radiation to his left upper quadrant and epigastric area. Symptoms started after patient drinks 6 beers. History of pancreatitis and he continues to drink. Denies any vomiting or diarrhea. No fever. No urinary symptoms. No medications taken prior to arrival.  Past Medical History  Diagnosis Date  . Pancreas disorder   . Diabetes mellitus   . Hyperlipidemia   . Shortness of breath     with ambulation    Past Surgical History  Procedure Date  . Back surgery   . Neck surgery   . Splenectomy     Family History  Problem Relation Age of Onset  . Diabetes Mother   . Aneurysm Mother   . Alcohol abuse Father     History  Substance Use Topics  . Smoking status: Current Everyday Smoker -- 1.0 packs/day for 45 years    Types: Cigarettes  . Smokeless tobacco: Never Used   Comment: trying to quitt. smoked 2 1/2 ppd before  . Alcohol Use: No     quit  05/28/11      Review of Systems  Gastrointestinal: Positive for abdominal pain.  All other systems reviewed and are negative.    Allergies  Morphine and related and Penicillins  Home Medications   Current Outpatient Rx  Name Route Sig Dispense Refill  . BC HEADACHE POWDER PO Oral Take 1 packet by mouth daily as needed. For pain/headache    . THERA M PLUS PO TABS Oral Take 1 tablet by mouth daily.    Marland Kitchen ONDANSETRON HCL 4 MG PO TABS Oral Take 4 mg by mouth every 8 (eight) hours as  needed. For nausea    . THIAMINE HCL 100 MG PO TABS Oral Take 100 mg by mouth daily.      BP 150/74  Pulse 78  Temp 97.6 F (36.4 C) (Oral)  Resp 18  SpO2 95%  Physical Exam  Nursing note and vitals reviewed. Constitutional: He is oriented to person, place, and time. He appears well-developed and well-nourished.  Non-toxic appearance. No distress.  HENT:  Head: Normocephalic and atraumatic.  Eyes: Conjunctivae, EOM and lids are normal. Pupils are equal, round, and reactive to light.  Neck: Normal range of motion. Neck supple. No tracheal deviation present. No mass present.  Cardiovascular: Normal rate, regular rhythm and normal heart sounds.  Exam reveals no gallop.   No murmur heard. Pulmonary/Chest: Effort normal and breath sounds normal. No stridor. No respiratory distress. He has no decreased breath sounds. He has no wheezes. He has no rhonchi. He has no rales.  Abdominal: Soft. Normal appearance and bowel sounds are normal. He exhibits no distension. There is tenderness in the epigastric area. There is no rigidity, no rebound, no guarding and no CVA tenderness.  Musculoskeletal: Normal range of motion. He exhibits no edema and no tenderness.  Neurological: He is alert and oriented to person, place, and time. He has normal strength. No cranial nerve deficit or sensory deficit. GCS  eye subscore is 4. GCS verbal subscore is 5. GCS motor subscore is 6.  Skin: Skin is warm and dry. No abrasion and no rash noted.  Psychiatric: He has a normal mood and affect. His speech is normal and behavior is normal.    ED Course  Procedures (including critical care time)  Labs Reviewed  CBC - Abnormal; Notable for the following:    WBC 14.8 (*)     RBC 5.84 (*)     Hemoglobin 18.7 (*)     HCT 53.9 (*)     All other components within normal limits  COMPREHENSIVE METABOLIC PANEL - Abnormal; Notable for the following:    Sodium 134 (*)     Chloride 95 (*)     Glucose, Bld 119 (*)     Total  Protein 8.9 (*)     All other components within normal limits  LIPASE, BLOOD   No results found.   No diagnosis found.    MDM  Patient given pain medication here. Also anti-medics. Will be admitted for pancreatitis        Leota Jacobsen, MD 02/17/12 2240

## 2012-02-17 NOTE — H&P (Signed)
Timothy Martinez is an 61 y.o. male.   Chief Complaint: Abdominal pain HPI: A 61 year old gentleman with history of alcoholism as well as tobacco abuse who has had chronic pancreatitis from alcohol use he was abdominal pain. Patient used to drink 18 beers a day until he had pancreatitis last year. Per patient repeat alcohol for about 6 months but went back again in 6 months ago. He has been drinking about 6 beers a day in the last couple of days. He started having nausea vomiting and epigastric pain. Pain is rated a 6-7/10 persistent radiating to his back. He had associated nausea but not much of vomiting. He denied any melena no bright red blood per rectum. He should have some symptoms of acid reflux and apparently has been told to stay away from tomatoes. He has been eating a lot of tomato sandwich in the last one week. He smokes about 1-1/2 pack per day and this has states the same. His pain is aggravated by meal and no relieving factor. He has no pain medications to take at home.  Past Medical History  Diagnosis Date  . Pancreas disorder   . Diabetes mellitus   . Hyperlipidemia   . Shortness of breath     with ambulation    Past Surgical History  Procedure Date  . Back surgery   . Neck surgery   . Splenectomy     Family History  Problem Relation Age of Onset  . Diabetes Mother   . Aneurysm Mother   . Alcohol abuse Father    Social History:  reports that he has been smoking Cigarettes.  He has a 45 pack-year smoking history. He has never used smokeless tobacco. He reports that he does not drink alcohol or use illicit drugs.  Allergies:  Allergies  Allergen Reactions  . Morphine And Related Swelling  . Penicillins Rash     (Not in a hospital admission)  Results for orders placed during the hospital encounter of 02/17/12 (from the past 48 hour(s))  CBC     Status: Abnormal   Collection Time   02/17/12  5:27 PM      Component Value Range Comment   WBC 14.8 (*) 4.0 - 10.5 K/uL     RBC 5.84 (*) 4.22 - 5.81 MIL/uL    Hemoglobin 18.7 (*) 13.0 - 17.0 g/dL    HCT 53.9 (*) 39.0 - 52.0 %    MCV 92.3  78.0 - 100.0 fL    MCH 32.0  26.0 - 34.0 pg    MCHC 34.7  30.0 - 36.0 g/dL    RDW 14.6  11.5 - 15.5 %    Platelets 226  150 - 400 K/uL   COMPREHENSIVE METABOLIC PANEL     Status: Abnormal   Collection Time   02/17/12  5:27 PM      Component Value Range Comment   Sodium 134 (*) 135 - 145 mEq/L    Potassium 5.0  3.5 - 5.1 mEq/L    Chloride 95 (*) 96 - 112 mEq/L    CO2 27  19 - 32 mEq/L    Glucose, Bld 119 (*) 70 - 99 mg/dL    BUN 8  6 - 23 mg/dL    Creatinine, Ser 0.83  0.50 - 1.35 mg/dL    Calcium 9.8  8.4 - 10.5 mg/dL    Total Protein 8.9 (*) 6.0 - 8.3 g/dL    Albumin 4.2  3.5 - 5.2 g/dL    AST  25  0 - 37 U/L    ALT 18  0 - 53 U/L    Alkaline Phosphatase 79  39 - 117 U/L    Total Bilirubin 0.8  0.3 - 1.2 mg/dL    GFR calc non Af Amer >90  >90 mL/min    GFR calc Af Amer >90  >90 mL/min   LIPASE, BLOOD     Status: Abnormal   Collection Time   02/17/12  8:47 PM      Component Value Range Comment   Lipase 169 (*) 11 - 59 U/L    Dg Abd Acute W/chest  02/17/2012  *RADIOLOGY REPORT*  Clinical Data: Left-sided abdominal pain.  ACUTE ABDOMEN SERIES (ABDOMEN 2 VIEW & CHEST 1 VIEW)  Comparison: 06/04/2011  Findings: Coarse interstitial markings and biapical opacities. Increased left lower lobe opacity since the prior may reflect progression of chronic changes are superimposed atelectasis or infiltrate.  Cardiomediastinal contours are unchanged.  Bowel gas pattern is nondilated.  No free intraperitoneal air identified.  Organ outlines normal where seen.  No acute osseous finding.  IMPRESSION: Coarse interstitial markings and biapical opacities, likely scarring. If the patient has never had a chest CT, a non origin chest CT follow-up is reasonable.  Left lower lobe opacity is likely scarring.  A superimposed infiltrate or atelectasis not excluded.  Bowel gas pattern is  nonobstructive.  Original Report Authenticated By: Suanne Marker, M.D.    Review of Systems  Constitutional: Negative.   HENT: Negative.   Eyes: Negative.   Respiratory: Negative.   Cardiovascular: Negative.   Gastrointestinal: Positive for nausea, vomiting and abdominal pain. Negative for heartburn, diarrhea, constipation, blood in stool and melena.  Genitourinary: Negative.   Musculoskeletal: Negative.   Skin: Negative.   Neurological: Negative.   Endo/Heme/Allergies: Negative.   Psychiatric/Behavioral: Negative.     Blood pressure 141/51, pulse 59, temperature 97.5 F (36.4 C), temperature source Oral, resp. rate 16, SpO2 96.00%. Physical Exam  Constitutional: He is oriented to person, place, and time. He appears well-developed and well-nourished.  HENT:  Head: Normocephalic and atraumatic.  Right Ear: External ear normal.  Left Ear: External ear normal.  Nose: Nose normal.  Mouth/Throat: Oropharynx is clear and moist.  Eyes: Conjunctivae and EOM are normal. Pupils are equal, round, and reactive to light.  Neck: Normal range of motion. Neck supple.  Cardiovascular: Normal rate, regular rhythm, normal heart sounds and intact distal pulses.   Respiratory: Effort normal and breath sounds normal.  GI: Soft. Bowel sounds are normal. He exhibits distension. He exhibits no mass. There is tenderness. There is no rebound and no guarding.  Musculoskeletal: Normal range of motion.  Neurological: He is alert and oriented to person, place, and time. He has normal reflexes.  Skin: Skin is warm and dry.  Psychiatric: He has a normal mood and affect. His behavior is normal. Judgment and thought content normal.     Assessment/Plan A 61 year old gentleman presenting with acute pancreatitis secondary to alcohol. He has had a history of chronic pancreatitis with this acute exacerbation. Also has history of diabetes type 2 hyperlipidemia tobacco abuse.  Plan #1 acute pancreatitis:  Patient will be admitted for pain control and bowel rest as well as control of his nausea vomiting. We'll hydrate him aggressively. He has been counseled once more about drinking alcohol. He knows that he will continue to have problems if it's drinks. Once is able to eat and drink adequately we will send him home.  #2 alcoholism:  Patient is in danger of withdrawal from alcohol. We'll keep him n.p.o. Starting on the CIWA protocol. Given some thiamine and folic acid IV.  #3 diabetes type 2: We'll put her on sliding scale insulin.  #4 tobacco abuse: Albumin nicotine patch as needed.  #5 hyperlipidemia: We'll resume home medications as soon as he starts POS.  Licia Harl,LAWAL 02/17/2012, 11:32 PM

## 2012-02-17 NOTE — ED Notes (Addendum)
Pt. Reports ULQ and LLQ abdominal pain x 2 days. "It started hurting yesterday at 7am".  Pain 10/10. Denies injury to area. Reports increased pain with eating and drinking. Last meal was yesterday evening at approx 8pm". Denies nausea and vomiting. States "I took Zofran at home". Denies headache. Denies dizziness.  Reports being most comfortable when laying on left side. Last bowel movement yesterday at noon. Reports having "2-3 beers yesterday at 7pm". A.O. X 4.

## 2012-02-17 NOTE — ED Notes (Signed)
Phlebotomy at bedside.

## 2012-02-18 ENCOUNTER — Encounter (HOSPITAL_COMMUNITY): Payer: Self-pay

## 2012-02-18 DIAGNOSIS — E785 Hyperlipidemia, unspecified: Secondary | ICD-10-CM

## 2012-02-18 DIAGNOSIS — K859 Acute pancreatitis without necrosis or infection, unspecified: Secondary | ICD-10-CM

## 2012-02-18 DIAGNOSIS — F17200 Nicotine dependence, unspecified, uncomplicated: Secondary | ICD-10-CM

## 2012-02-18 DIAGNOSIS — E119 Type 2 diabetes mellitus without complications: Secondary | ICD-10-CM

## 2012-02-18 DIAGNOSIS — F1010 Alcohol abuse, uncomplicated: Secondary | ICD-10-CM

## 2012-02-18 LAB — HEMOGLOBIN A1C
Hgb A1c MFr Bld: 6.3 % — ABNORMAL HIGH (ref <5.7)
Mean Plasma Glucose: 134 mg/dL — ABNORMAL HIGH (ref <117)

## 2012-02-18 LAB — COMPREHENSIVE METABOLIC PANEL
ALT: 13 U/L (ref 0–53)
AST: 18 U/L (ref 0–37)
Albumin: 3.4 g/dL — ABNORMAL LOW (ref 3.5–5.2)
Alkaline Phosphatase: 67 U/L (ref 39–117)
BUN: 8 mg/dL (ref 6–23)
CO2: 28 mEq/L (ref 19–32)
Calcium: 8.9 mg/dL (ref 8.4–10.5)
Chloride: 101 mEq/L (ref 96–112)
Creatinine, Ser: 0.82 mg/dL (ref 0.50–1.35)
GFR calc Af Amer: >90 mL/min (ref >90)
GFR calc non Af Amer: >90 mL/min (ref >90)
Glucose, Bld: 111 mg/dL — ABNORMAL HIGH (ref 70–99)
Potassium: 4.4 mEq/L (ref 3.5–5.1)
Sodium: 139 mEq/L (ref 135–145)
Total Bilirubin: 0.6 mg/dL (ref 0.3–1.2)
Total Protein: 7.4 g/dL (ref 6.0–8.3)

## 2012-02-18 LAB — CBC
HCT: 48.3 % (ref 39.0–52.0)
Hemoglobin: 16.8 g/dL (ref 13.0–17.0)
MCH: 32.2 pg (ref 26.0–34.0)
MCHC: 34.8 g/dL (ref 30.0–36.0)
MCV: 92.7 fL (ref 78.0–100.0)
Platelets: 203 10*3/uL (ref 150–400)
RBC: 5.21 MIL/uL (ref 4.22–5.81)
RDW: 14.8 % (ref 11.5–15.5)
WBC: 14.6 10*3/uL — ABNORMAL HIGH (ref 4.0–10.5)

## 2012-02-18 LAB — GLUCOSE, CAPILLARY: Glucose-Capillary: 97 mg/dL (ref 70–99)

## 2012-02-18 MED ORDER — HYDROMORPHONE HCL PF 1 MG/ML IJ SOLN
1.0000 mg | INTRAMUSCULAR | Status: DC | PRN
  Administered 2012-02-18 (×3): 1 mg via INTRAVENOUS
  Filled 2012-02-18 (×4): qty 1

## 2012-02-18 MED ORDER — ADULT MULTIVITAMIN W/MINERALS CH
1.0000 | ORAL_TABLET | Freq: Every day | ORAL | Status: DC
  Administered 2012-02-18 – 2012-02-21 (×4): 1 via ORAL
  Filled 2012-02-18 (×4): qty 1

## 2012-02-18 MED ORDER — THIAMINE HCL 100 MG/ML IJ SOLN
Freq: Once | INTRAVENOUS | Status: AC
  Administered 2012-02-18: 02:00:00 via INTRAVENOUS
  Filled 2012-02-18: qty 1000

## 2012-02-18 MED ORDER — SODIUM CHLORIDE 0.9 % IV SOLN: INTRAVENOUS | Status: DC

## 2012-02-18 MED ORDER — FOLIC ACID 1 MG PO TABS
1.0000 mg | ORAL_TABLET | Freq: Every day | ORAL | Status: DC
  Administered 2012-02-18 – 2012-02-21 (×4): 1 mg via ORAL
  Filled 2012-02-18 (×4): qty 1

## 2012-02-18 MED ORDER — LORAZEPAM 0.5 MG PO TABS: 1.0000 mg | ORAL_TABLET | Freq: Four times a day (QID) | ORAL | Status: AC | PRN

## 2012-02-18 MED ORDER — LORAZEPAM 2 MG/ML IJ SOLN: 1.0000 mg | Freq: Four times a day (QID) | INTRAMUSCULAR | Status: AC | PRN

## 2012-02-18 MED ORDER — IPRATROPIUM BROMIDE 0.02 % IN SOLN
0.5000 mg | Freq: Four times a day (QID) | RESPIRATORY_TRACT | Status: DC
  Administered 2012-02-18 (×3): 0.5 mg via RESPIRATORY_TRACT
  Filled 2012-02-18 (×4): qty 2.5

## 2012-02-18 MED ORDER — ALBUTEROL SULFATE (5 MG/ML) 0.5% IN NEBU
2.5000 mg | INHALATION_SOLUTION | RESPIRATORY_TRACT | Status: DC | PRN
Start: 2012-02-18 — End: 2012-02-21

## 2012-02-18 MED ORDER — HYDROMORPHONE HCL PF 1 MG/ML IJ SOLN
2.0000 mg | INTRAMUSCULAR | Status: DC | PRN
  Administered 2012-02-18 – 2012-02-19 (×8): 2 mg via INTRAVENOUS
  Filled 2012-02-18: qty 2
  Filled 2012-02-18: qty 1
  Filled 2012-02-18 (×5): qty 2

## 2012-02-18 MED ORDER — SODIUM CHLORIDE 0.9 % IJ SOLN
3.0000 mL | Freq: Two times a day (BID) | INTRAMUSCULAR | Status: DC
  Administered 2012-02-20 – 2012-02-21 (×2): 3 mL via INTRAVENOUS

## 2012-02-18 MED ORDER — VITAMIN B-1 100 MG PO TABS
100.0000 mg | ORAL_TABLET | Freq: Every day | ORAL | Status: DC
  Administered 2012-02-18 – 2012-02-21 (×4): 100 mg via ORAL
  Filled 2012-02-18 (×4): qty 1

## 2012-02-18 MED ORDER — THIAMINE HCL 100 MG/ML IJ SOLN
100.0000 mg | Freq: Every day | INTRAMUSCULAR | Status: DC
  Filled 2012-02-18 (×4): qty 1

## 2012-02-18 MED ORDER — ONDANSETRON HCL 4 MG PO TABS: 4.0000 mg | ORAL_TABLET | Freq: Four times a day (QID) | ORAL | Status: DC | PRN

## 2012-02-18 MED ORDER — DEXTROSE-NACL 5-0.9 % IV SOLN
INTRAVENOUS | Status: DC
  Administered 2012-02-18 (×2): via INTRAVENOUS
  Administered 2012-02-19: 1000 mL via INTRAVENOUS
  Administered 2012-02-19: 02:00:00 via INTRAVENOUS

## 2012-02-18 MED ORDER — ENOXAPARIN SODIUM 40 MG/0.4ML ~~LOC~~ SOLN
40.0000 mg | SUBCUTANEOUS | Status: DC
  Administered 2012-02-18 – 2012-02-20 (×3): 40 mg via SUBCUTANEOUS
  Filled 2012-02-18 (×4): qty 0.4

## 2012-02-18 MED ORDER — NICOTINE 21 MG/24HR TD PT24
21.0000 mg | MEDICATED_PATCH | Freq: Every day | TRANSDERMAL | Status: DC
  Administered 2012-02-19 – 2012-02-20 (×2): 21 mg via TRANSDERMAL
  Filled 2012-02-18 (×4): qty 1

## 2012-02-18 MED ORDER — ONDANSETRON HCL 4 MG/2ML IJ SOLN
4.0000 mg | Freq: Four times a day (QID) | INTRAMUSCULAR | Status: DC | PRN
  Administered 2012-02-20: 4 mg via INTRAVENOUS
  Filled 2012-02-18: qty 2

## 2012-02-18 MED ORDER — ALBUTEROL SULFATE (5 MG/ML) 0.5% IN NEBU
2.5000 mg | INHALATION_SOLUTION | Freq: Four times a day (QID) | RESPIRATORY_TRACT | Status: DC
  Administered 2012-02-18 (×3): 2.5 mg via RESPIRATORY_TRACT
  Filled 2012-02-18 (×4): qty 0.5

## 2012-02-18 NOTE — Progress Notes (Signed)
Subjective: Complaining of epigastric pain which he feels is similar to his previous pancreatitis pain.  Objective: Vital signs in last 24 hours: Filed Vitals:   02/18/12 0037 02/18/12 0240 02/18/12 0635 02/18/12 0832  BP: 155/70  125/51   Pulse: 65  60   Temp: 97.7 F (36.5 C)  97.8 F (36.6 C)   TempSrc: Oral  Oral   Resp: 20  18   Height: 6\' 3"  (1.905 m)     Weight: 93.033 kg (205 lb 1.6 oz)     SpO2: 96% 94% 95% 89%   Weight change:   Intake/Output Summary (Last 24 hours) at 02/18/12 0938 Last data filed at 02/18/12 0814  Gross per 24 hour  Intake   1000 ml  Output    700 ml  Net    300 ml    Physical Exam: General: Awake, Oriented, No acute distress. HEENT: EOMI. Neck: Supple CV: S1 and S2 Lungs: Clear to ascultation bilaterally Abdomen: Soft, tender in the epigastric region, Nondistended, +bowel sounds. Ext: Good pulses. Trace edema.  Lab Results: Basic Metabolic Panel:  Lab [id number] 0549 02/17/12 1727  NA 139 134*  K 4.4 5.0  CL 101 95*  CO2 28 27  GLUCOSE 111* 119*  BUN 8 8  CREATININE 0.82 0.83  CALCIUM 8.9 9.8  MG -- --  PHOS -- --   Liver Function Tests:  Lab 02/18/12 0549 02/17/12 1727  AST 18 25  ALT 13 18  ALKPHOS 67 79  BILITOT 0.6 0.8  PROT 7.4 8.9*  ALBUMIN 3.4* 4.2    Lab 02/17/12 2047  LIPASE 169*  AMYLASE --   No results found for this basename: AMMONIA:5 in the last 168 hours CBC:  Lab 02/18/12 0549 02/17/12 1727  WBC 14.6* 14.8*  NEUTROABS -- --  HGB 16.8 18.7*  HCT 48.3 53.9*  MCV 92.7 92.3  PLT 203 226   Cardiac Enzymes: No results found for this basename: CKTOTAL:5,CKMB:5,CKMBINDEX:5,TROPONINI:5 in the last 168 hours BNP (last 3 results) No results found for this basename: PROBNP:3 in the last 8760 hours CBG:  Lab 02/18/12 0631  GLUCAP 97   No results found for this basename: HGBA1C:5 in the last 72 hours Other Labs: No components found with this basename: POCBNP:3 No results found for this basename:  DDIMER:2 in the last 168 hours No results found for this basename: CHOL:2,HDL:2,LDLCALC:2,TRIG:2,CHOLHDL:2,LDLDIRECT:2 in the last 168 hours No results found for this basename: TSH,T4TOTAL,FREET3,T3FREE,FREET4,THYROIDAB in the last 168 hours No results found for this basename: VITAMINB12:2,FOLATE:2,FERRITIN:2,TIBC:2,IRON:2,RETICCTPCT:2 in the last 168 hours  Micro Results: No results found for this or any previous visit (from the past 240 hour(s)).  Studies/Results: Dg Abd Acute W/chest  02/17/2012  *RADIOLOGY REPORT*  Clinical Data: Left-sided abdominal pain.  ACUTE ABDOMEN SERIES (ABDOMEN 2 VIEW & CHEST 1 VIEW)  Comparison: 06/04/2011  Findings: Coarse interstitial markings and biapical opacities. Increased left lower lobe opacity since the prior may reflect progression of chronic changes are superimposed atelectasis or infiltrate.  Cardiomediastinal contours are unchanged.  Bowel gas pattern is nondilated.  No free intraperitoneal air identified.  Organ outlines normal where seen.  No acute osseous finding.  IMPRESSION: Coarse interstitial markings and biapical opacities, likely scarring. If the patient has never had a chest CT, a non origin chest CT follow-up is reasonable.  Left lower lobe opacity is likely scarring.  A superimposed infiltrate or atelectasis not excluded.  Bowel gas pattern is nonobstructive.  Original Report Authenticated By: Suanne Marker, M.D.  Medications: I have reviewed the patient's current medications. Scheduled Meds:   . sodium chloride   Intravenous Once  . albuterol  2.5 mg Nebulization Q6H  . enoxaparin (LOVENOX) injection  40 mg Subcutaneous Q24H  . gi cocktail  30 mL Oral Once  .  HYDROmorphone (DILAUDID) injection  1 mg Intravenous Once  . ipratropium  0.5 mg Nebulization Q6H  . nicotine  21 mg Transdermal Daily  . ondansetron  4 mg Intravenous Once  . pantoprazole  40 mg Intravenous Once  . general admission iv infusion   Intravenous Once  .  sodium chloride  1,000 mL Intravenous Once  . sodium chloride  3 mL Intravenous Q12H  . sucralfate  1 g Oral TID WC & HS  . DISCONTD: sodium chloride   Intravenous STAT   Continuous Infusions:   . dextrose 5 % and 0.9% NaCl 150 mL/hr at 02/18/12 0819   PRN Meds:.albuterol, HYDROmorphone (DILAUDID) injection, ondansetron (ZOFRAN) IV, ondansetron  Assessment/Plan: Acute on chronic pancreatitis Likely induced by alcohol consumption. Send for lipid panel. Continue pain control and bowel rest. Continue aggressive fluid resuscitation. Counseled on alcohol cessation. Continue dilaudid 1 mg IV q4h prn.  Alcoholism CIWA protocol started. Continue thiamine and folic acid. Social worker consulted to provide patient with resources for cessation as he is interested in cessation.  Diabetes type 2 SSI. Hemoglobin A1C 6.8 on 05/29/2011. Not on any diabetic medications at this time. SSI. Hemoglobin A1C pending.  Tobacco abuse Counseled on cessation. Nicotine patch as needed.   Hyperlipidemia Lipid panel to be drawn for tomorrow.   Prophylaxis Lovenox.  Code status Full code.  Patient is a Family Medicine Patient, to be transferred to Dr. Verlon Au service, discussed with [facility].   LOS: 1 day  Romano Stigger A, MD 02/18/2012, 9:38 AM

## 2012-02-18 NOTE — ED Notes (Signed)
Pt. Updated on plan of care: will be transported to Floor as soon as possible.

## 2012-02-18 NOTE — ED Notes (Signed)
Patient being transported upstairs on portable cardiac monitor with RN.

## 2012-02-19 LAB — COMPREHENSIVE METABOLIC PANEL
ALT: 12 U/L (ref 0–53)
AST: 18 U/L (ref 0–37)
Albumin: 3.3 g/dL — ABNORMAL LOW (ref 3.5–5.2)
Alkaline Phosphatase: 62 U/L (ref 39–117)
BUN: 5 mg/dL — ABNORMAL LOW (ref 6–23)
CO2: 24 mEq/L (ref 19–32)
Calcium: 8.9 mg/dL (ref 8.4–10.5)
Chloride: 103 mEq/L (ref 96–112)
Creatinine, Ser: 0.67 mg/dL (ref 0.50–1.35)
GFR calc Af Amer: >90 mL/min (ref >90)
GFR calc non Af Amer: >90 mL/min (ref >90)
Glucose, Bld: 122 mg/dL — ABNORMAL HIGH (ref 70–99)
Potassium: 3.9 mEq/L (ref 3.5–5.1)
Sodium: 137 mEq/L (ref 135–145)
Total Bilirubin: 0.7 mg/dL (ref 0.3–1.2)
Total Protein: 7.3 g/dL (ref 6.0–8.3)

## 2012-02-19 LAB — CBC
HCT: 48.1 % (ref 39.0–52.0)
Hemoglobin: 16.1 g/dL (ref 13.0–17.0)
MCH: 31.4 pg (ref 26.0–34.0)
MCHC: 33.5 g/dL (ref 30.0–36.0)
MCV: 93.8 fL (ref 78.0–100.0)
Platelets: 198 10*3/uL (ref 150–400)
RBC: 5.13 MIL/uL (ref 4.22–5.81)
RDW: 14.8 % (ref 11.5–15.5)
WBC: 11.9 10*3/uL — ABNORMAL HIGH (ref 4.0–10.5)

## 2012-02-19 LAB — LIPID PANEL
Cholesterol: 170 mg/dL (ref 0–200)
HDL: 35 mg/dL — ABNORMAL LOW (ref >39)
LDL Cholesterol: 113 mg/dL — ABNORMAL HIGH (ref 0–99)
Total CHOL/HDL Ratio: 4.9 RATIO
Triglycerides: 109 mg/dL (ref <150)
VLDL: 22 mg/dL (ref 0–40)

## 2012-02-19 LAB — GLUCOSE, CAPILLARY
Glucose-Capillary: 137 mg/dL — ABNORMAL HIGH (ref 70–99)
Glucose-Capillary: 119 mg/dL — ABNORMAL HIGH (ref 70–99)

## 2012-02-19 LAB — LIPASE, BLOOD: Lipase: 66 U/L — ABNORMAL HIGH (ref 11–59)

## 2012-02-19 MED ORDER — POLYETHYLENE GLYCOL 3350 17 G PO PACK
17.0000 g | PACK | Freq: Every day | ORAL | Status: DC
  Administered 2012-02-19 – 2012-02-21 (×3): 17 g via ORAL
  Filled 2012-02-19 (×3): qty 1

## 2012-02-19 MED ORDER — OXYCODONE HCL 5 MG PO TABS
5.0000 mg | ORAL_TABLET | ORAL | Status: DC | PRN
  Administered 2012-02-20 – 2012-02-21 (×8): 10 mg via ORAL
  Filled 2012-02-19 (×8): qty 2

## 2012-02-19 NOTE — Progress Notes (Signed)
FMTS Daily Progress Note  Subjective: Doing okay this morning.  Having some crampy lower abdominal pain this morning.  Last BM 1 week ago.  Tolerated clear liquid diet yesterday, trying full liquid this morning.  Still requiring dilaudid q4hr.  I have reviewed the patient's medications.  Objective Temp:  [97.3 F (36.3 C)-98.5 F (36.9 C)] 98.5 F (36.9 C) (07/28 0542) Pulse Rate:  [58-81] 67  (07/28 0542) Resp:  [18] 18  (07/28 0542) BP: (108-122)/(47-80) 111/60 mmHg (07/28 0542) SpO2:  [89 %-97 %] 95 % (07/28 0542) Weight:  [205 lb 7.5 oz (93.2 kg)] 205 lb 7.5 oz (93.2 kg) (07/28 0542)   Intake/Output Summary (Last 24 hours) at 02/19/12 0700 Last data filed at 02/19/12 0600  Gross per 24 hour  Intake   2415 ml  Output   2895 ml  Net   -480 ml    CBG (last 3)   Basename 02/18/12 0631  GLUCAP 97    General: sleeping comfortably, wakes easily, NAD HEENT: AT/[place], sclera white, MMM CV: RRR, no murmurs Pulm: CTAB Abd: +BS, distended but compressible, mild epigastric tenderness, tender in lower quandrants Ext: no edema Neuro: alert, moving all extremities  Labs and Imaging  Lab 02/19/12 0550 02/18/12 0549 02/17/12 1727  WBC 11.9* 14.6* 14.8*  HGB 16.1 16.8 18.7*  HCT 48.1 48.3 53.9*  PLT 198 203 226     Lab 02/19/12 0550 02/18/12 0549 02/17/12 1727  NA 137 139 134*  K 3.9 4.4 5.0  CL 103 101 95*  CO2 24 28 27   BUN 5* 8 8  CREATININE 0.67 0.82 0.83  LABGLOM -- -- --  GLUCOSE 122* -- --  CALCIUM 8.9 8.9 9.8   Lipid Panel     Component Value Date/Time   CHOL 170 02/19/2012 0550   TRIG 109 02/19/2012 0550   HDL 35* 02/19/2012 0550   CHOLHDL 4.9 02/19/2012 0550   VLDL 22 02/19/2012 0550   LDLCALC 113* 02/19/2012 0550   Lipase: 66   Assessment and Plan Mr. Grimstead is a 61 year old man with history of alcohol abuse and chronic pancreatitis presenting with an acute flair of his pancreatitis.  # Acute pancreatitis: Likely secondary to alcohol use.  Slowly  improving.  Tolerating fluids well. - advance diet as tolerated - continue dilaudid 2mg  q4hr prn - will likely be able to change to orals this afternoon or tomorrow - will decrease IVFs - will start Miralax daily given likely constipation secondary to narcotics  # Alcohol abuse: Stable.  Has not required any Ativan. - continue CIWA - social consulted by [facility] for cessation resources  # Diabetes, type II: A1c 6.3.  Diet controlled.   # Hyperlipidemia: Lipid panel looks good.  Goal LDL with diabetes would be <100; so not quite at goal.   - consider starting statin as outpatient  # Tobacco abuse: Current 1.5ppd x45 years. - continue nicotine patch  FEN: advance diet as tolerated; IVFs to KVO PPx: SQ Lovenox Dispo: pending clinical improvement  BOOTH, [place] Pager: [phone] 02/19/2012, 7:00 AM

## 2012-02-19 NOTE — Progress Notes (Signed)
FMTS Attending Daily Note: Dorcas Mcmurray MD [phone] pager office [phone] I have discussed this patient with the resident and reviewed the assessment and plan as documented above. I agree wit the resident's findings and plan. Advancing diet. Pain controlled.

## 2012-02-20 LAB — GLUCOSE, CAPILLARY
Glucose-Capillary: 103 mg/dL — ABNORMAL HIGH (ref 70–99)
Glucose-Capillary: 94 mg/dL (ref 70–99)

## 2012-02-20 NOTE — Clinical Social Work Psychosocial (Signed)
     Clinical Social Work Department BRIEF PSYCHOSOCIAL ASSESSMENT 02/20/2012  Patient:  Timothy Martinez, Timothy Martinez     Account Number:  [id number]     Admit date:  02/17/2012  Clinical Social Worker:  Valda Lamb  Date/Time:  02/20/2012 02:44 PM  Referred by:  Physician  Date Referred:  02/20/2012 Referred for  Substance Abuse   Other Referral:   Interview type:  Patient Other interview type:    PSYCHOSOCIAL DATA Living Status:  ALONE Admitted from facility:   Level of care:   Primary support name:  Sheryle Spray [phone] Primary support relationship to patient:  FAMILY Degree of support available:   Pt states his niece and nephew are actively involved in pt care.    CURRENT CONCERNS Current Concerns  Substance Abuse   Other Concerns:    SOCIAL WORK ASSESSMENT / PLAN CSW received a referral for substance abuse.    CSW visited pt room and explored pt history of substance abuse. Pt stated he has been drinking since the age of 39 years. Pt stated his father was an alcoholic and therefore pt was exposed to alcohol at a very young age. Pt stated he used to drink 18 beers a day before last year when he was diagnosed with pancreatitis. Pt stated he now drinks 6-18 beers a week.    CSW assessed whether pts drinking has caused challenges with his family/friends or with the law. Pt stated his alcohol was certainly a factor in his divorce however has not had any recent issues with his family or with the law. CSW explored pt feelings and motivation for change. Pt stated he knows he "is a drunk" and will die because of the alcohol. Pt stated he desires to change however knows it won't be easy. CSW provided positive feedback and praised pt for desire to become sober. CSW informed pt that CSW could provide pt with resources for substance abuse as it would be important for pt to seek appropriate resources to help him become sober. Pt stated he has never been in inpatient/outpatient treatment  and would not be interested in inpatient treatment however would consider outpatient. CSW encouraged pt to follow up with intensive outpatient facilities as well as attend AA meetings. CSW provided pt with resources to substance abuse facilities and signed off as pt had no further concerns or CSW needs.   Assessment/plan status:  Information/Referral to [employer] Other assessment/ plan:   Information/referral to community resources:   CSW completed an SBIRT with patient. CSW provided pt with resources for intensive outpatient and inpatient substance abuse facilities. CSW also printed pt out a list of [facility] meetings in [place]. Pt also received CSW contact information if counseling is needed from the outpatient FM clinic.    PATIENTS/FAMILYS RESPONSE TO PLAN OF CARE: Pt was laying in bed, alert and oriented. Pt was appreciative of CSW visit as pt stated his alcohol is his only problem. Pt stated he desires help however is not interested in inpatient treatment. Pt was receptive to CSW suggestions to seek counseling for both substance abuse and emotional support. Pt was very agreeable and stated there are several things he would like to talk to a therapist about. Pt appeared motivated to seek outpatient counseling however stated he would not remove himself from his current relationships with friends who also drink. Pt denied any concerns, appreciated CSW visit and will contact her if additional assistance needed. CSW signing off.

## 2012-02-20 NOTE — Discharge Summary (Signed)
Discharge Summary 02/21/2012 12:26 PM  Timothy Martinez DOB: 05-19-1951 MRN: [id number]  Date of Admission: 02/17/2012 Date of Discharge: 02/21/2012   PCP: Timothy Reddish, MD Consultants: None  Reason for Admission: Abdominal pain    Discharge Diagnosis Primary 1. Acute pancreatitis Secondary 1. Alcohol abuse 2. Diabetes type II 3. Hyperlipidemia 4. Tobacco abuse  Hospital Course: Timothy Martinez is a 61 year old man with history of alcohol abuse and chronic pancreatitis presenting with an acute flair of his pancreatitis.   # Acute pancreatitis: Patient with lipase 169 on admission and clinical picture correlating to acute pancreatitis, likely secondary to alcohol use as patient has h/o alcohol abuse and LFTs within normal limits making gallstone pancreatitis less likely. Patient was admitted for pain control and bowel rest with aggressive hydration.  Diet was advanced as tolerated and fluids stopped and  by day of discharge patient was tolerating a mechanical soft diet.  Pain also slowly improved, as patient was able to transition from IV dilaudid prn to oxycodone prn.  Continued Miralax daily given likely constipation secondary to narcotics.  Gave zofran prn nausea.  Lipase improved to 66 and symptoms improved, but pt felt unready to leave as he had not had a BM in >1 week.  Stayed overnight and following day passing lots of flatus but no stool, pt feels likely due to not eating for a few days prior to yesterday.  Discharged with rx for 20 tabs oxycodone IR 5mg  prn.  Pt stated he had miralax and zofran at home.  Pt with appt to f/u with PCP in 1-2 weeks.  # Alcohol abuse: Stable and patient did not required any Ativan. Continued CIWA protocol through hospital course, and social work consulted by [facility] for cessation resources.  Discussed with patient the importance of quitting/reducing intake.  Pt states that he has thiamine and multivitamins at home.  # Diabetes, type II: A1c 6.3. Diet  controlled.  No issues through hospital course.  # Hyperlipidemia: Lipid panel looks good. Goal LDL with diabetes would be <100; so not quite at goal.  Consider starting patient on statin as outpatient.  # Tobacco abuse: Current 1.5ppd x45 years. Continued nicotine patch this hospital course.  Pt open to CSW resources.  Discharge day PE:  BP 126/76  Pulse 87  Temp 98.1 F (36.7 C) (Oral)  Resp 19  Ht 6\' 3"  (1.905 m)  Wt 202 lb 13.2 oz (92 kg)  BMI 25.35 kg/m2  SpO2 94% General: comfortable-appearing, sitting by bed doing crossword, NAD  HEENT: AT/, sclera white, MMM  CV: RRR, no murmurs, 2+bilateral DP pulses  Pulm: CTAB with fine crackles at bilateral bases  Abd: +BS, distended but compressible, mild epigastric tenderness, tender in lower quandrants  Ext: no edema  Neuro: alert, moving all extremities   Procedures: None  Discharge Medications Medication List  As of 02/21/2012 12:26 PM   START taking these medications         folic acid 1 MG tablet   Commonly known as: FOLVITE   Take 1 tablet (1 mg total) by mouth daily.      oxyCODONE 5 MG immediate release tablet   Commonly known as: Oxy IR/ROXICODONE   Take 1 tablet (5 mg total) by mouth every 4 (four) hours as needed for pain.      polyethylene glycol packet   Commonly known as: MIRALAX / GLYCOLAX   Take 17 g by mouth daily as needed.      sucralfate 1 G  tablet   Commonly known as: CARAFATE   Take 1 tablet (1 g total) by mouth 4 (four) times daily -  with meals and at bedtime.      * thiamine 100 MG tablet   Take 1 tablet (100 mg total) by mouth daily.     * Notice: This list has 1 medication(s) that are the same as other medications prescribed for you. Read the directions carefully, and ask your doctor or other care provider to review them with you.       CONTINUE taking these medications         BC HEADACHE POWDER PO      multivitamins ther. w/minerals Tabs      ondansetron 4 MG tablet   Commonly  known as: ZOFRAN      * thiamine 100 MG tablet     * Notice: This list has 1 medication(s) that are the same as other medications prescribed for you. Read the directions carefully, and ask your doctor or other care provider to review them with you.        Where to get your medications    These are the prescriptions that you need to pick up. We sent them to a specific pharmacy, so you will need to go there to get them.   [facility] - [place], [place] - [address] [address]    [address] [place] [address]    Phone: [phone]        folic acid 1 MG tablet   polyethylene glycol packet   sucralfate 1 G tablet   thiamine 100 MG tablet         You may get these medications from any pharmacy.         oxyCODONE 5 MG immediate release tablet            Pertinent Hospital Labs  Lipase 169-->66 AST 18 ALT 12 Lipid Panel     Component Value Date/Time   CHOL 170 02/19/2012 0550   TRIG 109 02/19/2012 0550   HDL 35* 02/19/2012 0550   CHOLHDL 4.9 02/19/2012 0550   VLDL 22 02/19/2012 0550   LDLCALC 113* 02/19/2012 0550    CBC    Component Value Date/Time   WBC 11.9* 02/19/2012 0550   RBC 5.13 02/19/2012 0550   HGB 16.1 02/19/2012 0550   HCT 48.1 02/19/2012 0550   PLT 198 02/19/2012 0550   MCV 93.8 02/19/2012 0550   MCH 31.4 02/19/2012 0550   MCHC 33.5 02/19/2012 0550   RDW 14.8 02/19/2012 0550   LYMPHSABS 2.5 05/28/2011 1458   MONOABS 1.4* 05/28/2011 1458   EOSABS 0.2 05/28/2011 1458   BASOSABS 0.1 05/28/2011 1458   BMET    Component Value Date/Time   NA 137 02/19/2012 0550   K 3.9 02/19/2012 0550   CL 103 02/19/2012 0550   CO2 24 02/19/2012 0550   GLUCOSE 122* 02/19/2012 0550   BUN 5* 02/19/2012 0550   CREATININE 0.67 02/19/2012 0550   CALCIUM 8.9 02/19/2012 0550   GFRNONAA >90 02/19/2012 0550   GFRAA >90 02/19/2012 0550    Discharge instructions: See AVS  Condition at discharge: stable  Disposition: Home  Pending Tests:  None  Follow up: Follow-up Information    Follow up with Timothy Reddish, MD on 02/29/2012. (at 3 pm with Timothy Martinez - Please arrive 10 minutes prior to appt.)    Contact information:   [address]  [place] [phone]          Follow up Issues:  - Resolution of symptoms - Consider starting Statin - Support for alcohol and tobacco cessation

## 2012-02-20 NOTE — Progress Notes (Signed)
FMTS Attending Daily Note:  Annabell Sabal MD  [phone] pager  Family Practice pager:  [phone] I have seen and examined this patient and have reviewed their chart. I have discussed this patient with the resident. I agree with the resident's findings, assessment and care plan.  Patient continues to improve.  Desires 1 further day in hospital to ensure passage of bowel.  Still with some crampy abdominal pain.   Abdominal exam is benign, soft, nontender.  Pancreatitis is resolving.  Plan DC home tomorrow.

## 2012-02-20 NOTE — Progress Notes (Signed)
Family Medicine Teaching Service Interim Progress Note Conni Slipper, MD PGY-1  Checked on patient who stated he had good PO with mechanical soft diet and was slightly nauseous but had no vomiting.  Has been passing lots of gas but no stool.  No stool yet likely just because he has been NPO for a few days.  Likely d/c in AM.    Conni Slipper 02/20/2012 8:56 PM

## 2012-02-20 NOTE — Progress Notes (Signed)
FMTS Daily Progress Note  Subjective: Doing okay this morning.  Having some crampy lower abdominal pain this morning.  Last BM 1 week ago but passing gas today.  Tolerated full liquid diet today, trying mechanical soft diet today per pt request.  Switched from dilaudid q4hr to oxycodone q4 hr.  Denies n/v/sob/cp.  I have reviewed the patient's medications.  Objective Temp:  [97.5 F (36.4 C)-97.9 F (36.6 C)] 97.5 F (36.4 C) (07/29 0537) Pulse Rate:  [79-90] 79  (07/29 0537) Resp:  [20] 20  (07/29 0537) BP: (124-142)/(56-82) 124/73 mmHg (07/29 0537) SpO2:  [93 %-97 %] 93 % (07/29 0537) Weight:  [202 lb 13.2 oz (92 kg)] 202 lb 13.2 oz (92 kg) (07/29 0537)   Intake/Output Summary (Last 24 hours) at 02/20/12 0927 Last data filed at 02/20/12 0741  Gross per 24 hour  Intake 4060.67 ml  Output   1700 ml  Net 2360.67 ml    CBG (last 3)   Basename 02/20/12 0601 02/19/12 2130 02/19/12 1112  GLUCAP 94 103* 119*    General: comfortable-appearing, lying in bed, NAD HEENT: AT/[place], sclera white, MMM CV: RRR, no murmurs, 2+bilateral DP pulses Pulm: CTAB with fine crackles at bilateral bases Abd: +BS, distended but compressible, mild epigastric tenderness, tender in lower quandrants Ext: no edema Neuro: alert, moving all extremities  Labs and Imaging  Lab 02/19/12 0550 02/18/12 0549 02/17/12 1727  WBC 11.9* 14.6* 14.8*  HGB 16.1 16.8 18.7*  HCT 48.1 48.3 53.9*  PLT 198 203 226     Lab 02/19/12 0550 02/18/12 0549 02/17/12 1727  NA 137 139 134*  K 3.9 4.4 5.0  CL 103 101 95*  CO2 24 28 27   BUN 5* 8 8  CREATININE 0.67 0.82 0.83  LABGLOM -- -- --  GLUCOSE 122* -- --  CALCIUM 8.9 8.9 9.8   Lipid Panel     Component Value Date/Time   CHOL 170 02/19/2012 0550   TRIG 109 02/19/2012 0550   HDL 35* 02/19/2012 0550   CHOLHDL 4.9 02/19/2012 0550   VLDL 22 02/19/2012 0550   LDLCALC 113* 02/19/2012 0550   Lipase: 66   Assessment and Plan Timothy Martinez is a 61 year old man with  history of alcohol abuse and chronic pancreatitis presenting with an acute flair of his pancreatitis.  # Acute pancreatitis: Likely secondary to alcohol use.  Slowly improving.  Tolerating fluids well. - advance diet as tolerated, trying mechanical soft diet today - continue oxycodone 5-10mg  q4hr PO prn  - IVFs KVO - Continue Miralax daily given likely constipation secondary to narcotics; F/u for a BM  # Alcohol abuse: Stable.  Has not required any Ativan. - continue CIWA - social consulted by [facility] for cessation resources  # Diabetes, type II: A1c 6.3.  Diet controlled.   # Hyperlipidemia: Lipid panel looks good.  Goal LDL with diabetes would be <100; so not quite at goal.   - consider starting statin as outpatient  # Tobacco abuse: Current 1.5ppd x45 years. - continue nicotine patch  FEN: mechanical soft, advance diet as tolerated; IVFs to KVO PPx: SQ Lovenox, added incentive spirometry today Dispo: pending clinical improvement  Conni Slipper [phone] 02/20/2012, 9:27 AM

## 2012-02-21 LAB — GLUCOSE, CAPILLARY: Glucose-Capillary: 106 mg/dL — ABNORMAL HIGH (ref 70–99)

## 2012-02-21 MED ORDER — POLYETHYLENE GLYCOL 3350 17 G PO PACK: 17.0000 g | PACK | Freq: Every day | ORAL | Status: DC | PRN

## 2012-02-21 MED ORDER — FOLIC ACID 1 MG PO TABS: 1.0000 mg | ORAL_TABLET | Freq: Every day | ORAL | Status: DC

## 2012-02-21 MED ORDER — THIAMINE HCL 100 MG PO TABS: 100.0000 mg | ORAL_TABLET | Freq: Every day | ORAL | Status: DC

## 2012-02-21 MED ORDER — SUCRALFATE 1 G PO TABS: 1.0000 g | ORAL_TABLET | Freq: Three times a day (TID) | ORAL | Status: DC

## 2012-02-21 MED ORDER — OXYCODONE HCL 5 MG PO TABS: 5.0000 mg | ORAL_TABLET | ORAL | Status: DC | PRN

## 2012-02-21 NOTE — Progress Notes (Signed)
Pt A/O X3, pt discharged to home, d/c instructions and meds given to pt, pt verbalized understanding, follow up apt given to pt, prescriptions given, pt leaving unit via wheelchair and appears in no distress, IV and tele D/C'd, Montez Hageman, RN

## 2012-02-21 NOTE — Discharge Summary (Signed)
Family Medicine Teaching Service  Discharge Note : Attending Annabell Sabal MD Pager [phone] Inpatient Team Pager:  [phone]  I have seen and examined this patient, reviewed their chart and discussed discharge planning wit the resident at the time of discharge. I agree with the discharge plan as above.

## 2012-02-25 ENCOUNTER — Encounter (HOSPITAL_COMMUNITY): Payer: Self-pay | Admitting: Emergency Medicine

## 2012-02-25 ENCOUNTER — Inpatient Hospital Stay (HOSPITAL_COMMUNITY)
Admission: EM | Admit: 2012-02-25 | Discharge: 2012-02-29 | DRG: 440 | Disposition: A | Discharge status: Home / Self Care | Payer: Medicare Other | Attending: Family Medicine | Admitting: Family Medicine

## 2012-02-25 DIAGNOSIS — K859 Acute pancreatitis without necrosis or infection, unspecified: Principal | ICD-10-CM | POA: Diagnosis present

## 2012-02-25 DIAGNOSIS — R7989 Other specified abnormal findings of blood chemistry: Secondary | ICD-10-CM

## 2012-02-25 DIAGNOSIS — F1020 Alcohol dependence, uncomplicated: Secondary | ICD-10-CM | POA: Diagnosis present

## 2012-02-25 DIAGNOSIS — R748 Abnormal levels of other serum enzymes: Secondary | ICD-10-CM | POA: Diagnosis present

## 2012-02-25 DIAGNOSIS — E119 Type 2 diabetes mellitus without complications: Secondary | ICD-10-CM

## 2012-02-25 DIAGNOSIS — Z720 Tobacco use: Secondary | ICD-10-CM | POA: Diagnosis present

## 2012-02-25 DIAGNOSIS — F1010 Alcohol abuse, uncomplicated: Secondary | ICD-10-CM | POA: Diagnosis present

## 2012-02-25 DIAGNOSIS — F17200 Nicotine dependence, unspecified, uncomplicated: Secondary | ICD-10-CM | POA: Diagnosis present

## 2012-02-25 DIAGNOSIS — F1011 Alcohol abuse, in remission: Secondary | ICD-10-CM

## 2012-02-25 DIAGNOSIS — K861 Other chronic pancreatitis: Secondary | ICD-10-CM | POA: Diagnosis present

## 2012-02-25 DIAGNOSIS — K2990 Gastroduodenitis, unspecified, without bleeding: Secondary | ICD-10-CM | POA: Diagnosis not present

## 2012-02-25 DIAGNOSIS — R6889 Other general symptoms and signs: Secondary | ICD-10-CM | POA: Diagnosis not present

## 2012-02-25 DIAGNOSIS — R9431 Abnormal electrocardiogram [ECG] [EKG]: Secondary | ICD-10-CM | POA: Diagnosis not present

## 2012-02-25 LAB — LIPASE, BLOOD: Lipase: 895 U/L — ABNORMAL HIGH (ref 11–59)

## 2012-02-25 LAB — CBC WITH DIFFERENTIAL/PLATELET
Basophils Absolute: 0.1 10*3/uL (ref 0.0–0.1)
Basophils Relative: 1 % (ref 0–1)
Eosinophils Absolute: 0.3 10*3/uL (ref 0.0–0.7)
Eosinophils Relative: 3 % (ref 0–5)
HCT: 48.4 % (ref 39.0–52.0)
Hemoglobin: 16.7 g/dL (ref 13.0–17.0)
Lymphocytes Relative: 24 % (ref 12–46)
Lymphs Abs: 2.5 10*3/uL (ref 0.7–4.0)
MCH: 31.7 pg (ref 26.0–34.0)
MCHC: 34.5 g/dL (ref 30.0–36.0)
MCV: 91.8 fL (ref 78.0–100.0)
Monocytes Absolute: 1.1 10*3/uL — ABNORMAL HIGH (ref 0.1–1.0)
Monocytes Relative: 11 % (ref 3–12)
Neutro Abs: 6.5 10*3/uL (ref 1.7–7.7)
Neutrophils Relative %: 62 % (ref 43–77)
Platelets: 288 10*3/uL (ref 150–400)
RBC: 5.27 MIL/uL (ref 4.22–5.81)
RDW: 14.1 % (ref 11.5–15.5)
WBC: 11 10*3/uL — ABNORMAL HIGH (ref 4.0–10.5)

## 2012-02-25 LAB — COMPREHENSIVE METABOLIC PANEL
ALT: 106 U/L — ABNORMAL HIGH (ref 0–53)
AST: 146 U/L — ABNORMAL HIGH (ref 0–37)
Albumin: 3.4 g/dL — ABNORMAL LOW (ref 3.5–5.2)
Alkaline Phosphatase: 174 U/L — ABNORMAL HIGH (ref 39–117)
BUN: 7 mg/dL (ref 6–23)
CO2: 26 mEq/L (ref 19–32)
Calcium: 9.6 mg/dL (ref 8.4–10.5)
Chloride: 103 mEq/L (ref 96–112)
Creatinine, Ser: 0.76 mg/dL (ref 0.50–1.35)
GFR calc Af Amer: >90 mL/min (ref >90)
GFR calc non Af Amer: >90 mL/min (ref >90)
Glucose, Bld: 257 mg/dL — ABNORMAL HIGH (ref 70–99)
Potassium: 4.7 mEq/L (ref 3.5–5.1)
Sodium: 138 mEq/L (ref 135–145)
Total Bilirubin: 0.6 mg/dL (ref 0.3–1.2)
Total Protein: 8.1 g/dL (ref 6.0–8.3)

## 2012-02-25 MED ORDER — ONDANSETRON HCL 4 MG/2ML IJ SOLN
4.0000 mg | Freq: Once | INTRAMUSCULAR | Status: AC
  Administered 2012-02-25: 4 mg via INTRAVENOUS
  Filled 2012-02-25: qty 2

## 2012-02-25 MED ORDER — HYDROMORPHONE HCL PF 1 MG/ML IJ SOLN
1.0000 mg | Freq: Once | INTRAMUSCULAR | Status: AC
  Administered 2012-02-25: 1 mg via INTRAVENOUS
  Filled 2012-02-25: qty 1

## 2012-02-25 NOTE — ED Provider Notes (Addendum)
History     CSN: [id number]  Arrival date & time 02/25/12  2232   First MD Initiated Contact with Patient 02/25/12 2303      Chief Complaint  Patient presents with  . Abdominal Pain    (Consider location/radiation/quality/duration/timing/severity/associated sxs/prior treatment) HPI Comments: Patient reports that he was recently discharged from the hospital after exacerbation of pancreatitis, secondary to alcohol use. He has had prior episodes of pancreatitis in the past. He insists that he has not had any alcohol since discharge. He reports that 6 PM he ate some mashed potatoes and some fish sticks and approximately 15 minutes afterwards began having abdominal bloating, severe pain it radiated across his upper abdomen more to the left side. He did have nausea but no vomiting. He reports he has not had a bowel movement today but did have a small one yesterday. He denies fever chills, chest pain, diaphoresis or shortness of breath. He reports he did have one episode of what he thought was hematuria early this morning but has since resolved. He reports that he does sometimes have dark urine due to his medications but did not think that was the cause for this. He denies prior history of kidney stone and again currently denies any explicit flank pain, dysuria. He reports he he took one of his oxycodone with no significant relief. He reports that he did have relief from pain the other day when he had a moderate exacerbation of pain. He comes here today because the pain is getting worse and his pain medication did not seem to help his pain.  Patient is a 61 y.o. male presenting with abdominal pain. The history is provided by the patient and medical records.  Abdominal Pain The primary symptoms of the illness include abdominal pain and nausea. The primary symptoms of the illness do not include fever, fatigue, shortness of breath, vomiting, diarrhea or dysuria.  Additional symptoms associated with the  illness include hematuria. Symptoms associated with the illness do not include chills, diaphoresis, constipation or back pain.    Past Medical History  Diagnosis Date  . Pancreas disorder   . Diabetes mellitus   . Hyperlipidemia   . Shortness of breath     with ambulation  . Anginal pain   . Pneumonia   . GERD (gastroesophageal reflux disease)   . Headache   . Arthritis     Past Surgical History  Procedure Date  . Back surgery   . Neck surgery   . Splenectomy     Family History  Problem Relation Age of Onset  . Diabetes Mother   . Aneurysm Mother   . Alcohol abuse Father     History  Substance Use Topics  . Smoking status: Current Everyday Smoker -- 1.5 packs/day for 45 years    Types: Cigarettes  . Smokeless tobacco: Never Used   Comment: trying to quitt. smoked 2 1/2 ppd before  . Alcohol Use: 0.0 oz/week    6-7 Cans of beer per week     quit  05/28/11      Review of Systems  Constitutional: Negative for fever, chills, diaphoresis and fatigue.  Respiratory: Negative for chest tightness and shortness of breath.   Cardiovascular: Negative for chest pain.  Gastrointestinal: Positive for nausea and abdominal pain. Negative for vomiting, diarrhea, constipation and blood in stool.  Genitourinary: Positive for hematuria. Negative for dysuria.  Musculoskeletal: Negative for back pain.  All other systems reviewed and are negative.    Allergies  Morphine and related and Penicillins  Home Medications   Current Outpatient Rx  Name Route Sig Dispense Refill  . BC HEADACHE POWDER PO Oral Take 1 packet by mouth daily as needed. For pain/headache    . FOLIC ACID 1 MG PO TABS Oral Take 1 tablet (1 mg total) by mouth daily. 30 tablet 0  . THERA M PLUS PO TABS Oral Take 1 tablet by mouth daily.    Marland Kitchen ONDANSETRON HCL 4 MG PO TABS Oral Take 4 mg by mouth every 8 (eight) hours as needed. For nausea    . OXYCODONE HCL 5 MG PO TABS Oral Take 1 tablet (5 mg total) by mouth  every 4 (four) hours as needed for pain. 20 tablet 0  . SUCRALFATE 1 G PO TABS Oral Take 1 tablet (1 g total) by mouth 4 (four) times daily -  with meals and at bedtime. 30 tablet 0  . THIAMINE HCL 100 MG PO TABS Oral Take 100 mg by mouth daily.    . THIAMINE HCL 100 MG PO TABS Oral Take 1 tablet (100 mg total) by mouth daily. 10 tablet 0    BP 134/74  Pulse 97  Temp 98.5 F (36.9 C) (Oral)  Resp 18  SpO2 98%  Physical Exam  Nursing note and vitals reviewed. Constitutional: He is oriented to person, place, and time. He appears well-developed and well-nourished.  HENT:  Head: Normocephalic and atraumatic.  Neck: Normal range of motion. Neck supple.  Cardiovascular: Normal rate and regular rhythm.   Pulmonary/Chest: Effort normal and breath sounds normal. No respiratory distress.  Abdominal: Soft. He exhibits distension. There is tenderness. There is no rebound and no guarding.  Neurological: He is alert and oriented to person, place, and time.  Skin: Skin is warm and dry. No rash noted. No erythema.  Psychiatric: He has a normal mood and affect.    ED Course  Procedures (including critical care time)  Labs Reviewed  CBC WITH DIFFERENTIAL - Abnormal; Notable for the following:    WBC 11.0 (*)     Monocytes Absolute 1.1 (*)     All other components within normal limits  COMPREHENSIVE METABOLIC PANEL - Abnormal; Notable for the following:    Glucose, Bld 257 (*)     Albumin 3.4 (*)     AST 146 (*)     ALT 106 (*)     Alkaline Phosphatase 174 (*)     All other components within normal limits  LIPASE, BLOOD - Abnormal; Notable for the following:    Lipase 895 (*)     All other components within normal limits  URINALYSIS, ROUTINE W REFLEX MICROSCOPIC - Abnormal; Notable for the following:    Glucose, UA 500 (*)     Urobilinogen, UA 4.0 (*)     All other components within normal limits   No results found.   1. Pancreatitis   2. LFT elevation     RA sat is 98% and I  interpret to be normal.  ECG at time 23:44 shows NSR rate 74, RVR` in V2, normal axis, no ST or T wave abn's.    12:38 AM Pt reports only mild improvement of pain.  Pt' lipase is much higher and now has increase in LFT's.  Will order U/S and likely will need re-admission.    2:03 AM U/S still pending, will be re-admitted by [facility]  MDM  I reviewed prior records including labs, ED note, inpt labs.  Pt had  what appears to be mild exacerbation of pancreatitis with lipase up to mid 100's.  Likely pt's meal of fish sticks certainly could have provoked a re-exacerbation.  Will give IVF's, IV analgesics and monitor for improvement.  Pt is not toxic appearing and appears hydrated.          Saddie Benders. Dorna Mai, MD 02/26/12 Grant Dorna Mai, MD 02/26/12 [id number]

## 2012-02-25 NOTE — ED Notes (Signed)
Patient complaining of abdominal pain that started tonight around 1800 this evening; denies nausea, vomiting and diarrhea.  Released here on Tuesday; was diagnosed with exacerbation of pancreatitis.

## 2012-02-25 NOTE — ED Notes (Addendum)
MD at bedside. 

## 2012-02-25 NOTE — ED Notes (Signed)
Stated left the hosp tue at 1200, was admitted for pancreatitis, stated having the same symptom like last one,  Diffused abdominal  and bloating. Rating his pain 10/10 on scale of 1-10 . Took some pain medication and pain cont and " I called EMS"

## 2012-02-26 ENCOUNTER — Inpatient Hospital Stay (HOSPITAL_COMMUNITY): Payer: Medicare Other

## 2012-02-26 DIAGNOSIS — F1010 Alcohol abuse, uncomplicated: Secondary | ICD-10-CM

## 2012-02-26 DIAGNOSIS — E119 Type 2 diabetes mellitus without complications: Secondary | ICD-10-CM

## 2012-02-26 DIAGNOSIS — F17200 Nicotine dependence, unspecified, uncomplicated: Secondary | ICD-10-CM

## 2012-02-26 DIAGNOSIS — K861 Other chronic pancreatitis: Secondary | ICD-10-CM

## 2012-02-26 DIAGNOSIS — K828 Other specified diseases of gallbladder: Secondary | ICD-10-CM | POA: Diagnosis not present

## 2012-02-26 DIAGNOSIS — R109 Unspecified abdominal pain: Secondary | ICD-10-CM | POA: Diagnosis not present

## 2012-02-26 LAB — COMPREHENSIVE METABOLIC PANEL
ALT: 80 U/L — ABNORMAL HIGH (ref 0–53)
AST: 59 U/L — ABNORMAL HIGH (ref 0–37)
Albumin: 3.2 g/dL — ABNORMAL LOW (ref 3.5–5.2)
Alkaline Phosphatase: 149 U/L — ABNORMAL HIGH (ref 39–117)
BUN: 7 mg/dL (ref 6–23)
CO2: 29 mEq/L (ref 19–32)
Calcium: 9.2 mg/dL (ref 8.4–10.5)
Chloride: 103 mEq/L (ref 96–112)
Creatinine, Ser: 0.86 mg/dL (ref 0.50–1.35)
GFR calc Af Amer: >90 mL/min (ref >90)
GFR calc non Af Amer: >90 mL/min (ref >90)
Glucose, Bld: 110 mg/dL — ABNORMAL HIGH (ref 70–99)
Potassium: 4.2 mEq/L (ref 3.5–5.1)
Sodium: 138 mEq/L (ref 135–145)
Total Bilirubin: 0.6 mg/dL (ref 0.3–1.2)
Total Protein: 7.5 g/dL (ref 6.0–8.3)

## 2012-02-26 LAB — URINALYSIS, ROUTINE W REFLEX MICROSCOPIC
Bilirubin Urine: NEGATIVE
Glucose, UA: 500 mg/dL — AB
Hgb urine dipstick: NEGATIVE
Ketones, ur: NEGATIVE mg/dL
Leukocytes, UA: NEGATIVE
Nitrite: NEGATIVE
Protein, ur: NEGATIVE mg/dL
Specific Gravity, Urine: 1.014 (ref 1.005–1.030)
Urobilinogen, UA: 4 mg/dL — ABNORMAL HIGH (ref 0.0–1.0)
pH: 7.5 (ref 5.0–8.0)

## 2012-02-26 LAB — CBC
HCT: 47.4 % (ref 39.0–52.0)
Hemoglobin: 16.2 g/dL (ref 13.0–17.0)
MCH: 31.6 pg (ref 26.0–34.0)
MCHC: 34.2 g/dL (ref 30.0–36.0)
MCV: 92.6 fL (ref 78.0–100.0)
Platelets: 310 10*3/uL (ref 150–400)
RBC: 5.12 MIL/uL (ref 4.22–5.81)
RDW: 14.2 % (ref 11.5–15.5)
WBC: 9.7 10*3/uL (ref 4.0–10.5)

## 2012-02-26 MED ORDER — MORPHINE SULFATE 2 MG/ML IJ SOLN: 2.0000 mg | INTRAMUSCULAR | Status: DC | PRN

## 2012-02-26 MED ORDER — PROMETHAZINE HCL 25 MG PO TABS: 12.5000 mg | ORAL_TABLET | Freq: Four times a day (QID) | ORAL | Status: DC | PRN

## 2012-02-26 MED ORDER — SODIUM CHLORIDE 0.9 % IV SOLN
INTRAVENOUS | Status: DC
  Administered 2012-02-26 – 2012-02-29 (×8): via INTRAVENOUS

## 2012-02-26 MED ORDER — FOLIC ACID 1 MG PO TABS
1.0000 mg | ORAL_TABLET | Freq: Every day | ORAL | Status: DC
  Administered 2012-02-26 – 2012-02-29 (×4): 1 mg via ORAL
  Filled 2012-02-26 (×4): qty 1

## 2012-02-26 MED ORDER — KETOROLAC TROMETHAMINE 30 MG/ML IJ SOLN
30.0000 mg | Freq: Three times a day (TID) | INTRAMUSCULAR | Status: DC | PRN
  Administered 2012-02-26 – 2012-02-27 (×2): 30 mg via INTRAVENOUS
  Filled 2012-02-26 (×2): qty 1

## 2012-02-26 MED ORDER — NICOTINE 14 MG/24HR TD PT24
14.0000 mg | MEDICATED_PATCH | Freq: Every day | TRANSDERMAL | Status: DC
  Administered 2012-02-27 – 2012-02-29 (×3): 14 mg via TRANSDERMAL
  Filled 2012-02-26 (×4): qty 1

## 2012-02-26 MED ORDER — POLYETHYLENE GLYCOL 3350 17 G PO PACK
17.0000 g | PACK | Freq: Every day | ORAL | Status: DC | PRN
  Administered 2012-02-26: 17 g via ORAL
  Filled 2012-02-26: qty 1

## 2012-02-26 MED ORDER — HEPARIN SODIUM (PORCINE) 5000 UNIT/ML IJ SOLN
5000.0000 [IU] | Freq: Three times a day (TID) | INTRAMUSCULAR | Status: DC
  Administered 2012-02-26 – 2012-02-29 (×10): 5000 [IU] via SUBCUTANEOUS
  Filled 2012-02-26 (×13): qty 1

## 2012-02-26 MED ORDER — HYDROMORPHONE HCL PF 1 MG/ML IJ SOLN
1.0000 mg | Freq: Once | INTRAMUSCULAR | Status: AC
  Administered 2012-02-26: 1 mg via INTRAVENOUS
  Filled 2012-02-26: qty 1

## 2012-02-26 MED ORDER — OXYCODONE HCL 5 MG/5ML PO SOLN
5.0000 mg | ORAL | Status: DC | PRN
  Administered 2012-02-27 – 2012-02-29 (×6): 5 mg via ORAL
  Filled 2012-02-26 (×6): qty 5

## 2012-02-26 NOTE — Progress Notes (Signed)
  Seen and examined patient this morning.  He is resting comfortably in bed, asking for a cup of coffee and ice chips.  No complaints of abdominal pain or nausea at this time.  Per nurse, he did not receive any Morphine overnight because he says it causes swelling.  However, he has had both Dilaudid and Oxycodone in the past without any adverse effects.    Reviewed [place] results with patient and discussed the importance of bowel rest for the next few hours.  For pain, will start Toradol 30 every 8 hr as needed for pain, BUN/CR within normal limits.  Will also give Miralax daily as needed - he has not had a BM in 2-3 days.  Inpatient team to re-assess diet at dinner time.

## 2012-02-26 NOTE — H&P (Signed)
[facility] Admission History and Physical Service Pager: [phone]  Patient name: Timothy Martinez Medical record number: [id number] Date of birth: 1950-10-15 Age: 61 y.o. Gender: male  Primary Care Provider: [name], MD  Chief Complaint: Abdominal pain, nausea  Assessment and Plan: Timothy Martinez is a 61 y.o. year old male with a PMH of chronic pancreatitis presenting with acute exacerbation of pancreatitis.  1. Acute pancreatitis - On admission, Lipase = 895.  Elevated Alk phos of 174, AST 146, ALT 106 - Patient will be admitted for pain control, IV hydration, and bowel rest. - IV Zofran & Phenergan for nausea. - Patient reports no recent alcohol use.  Elevated AST/ALT/Alk phos suggestive of biliary tract disease.  Abdominal ultrasound ordered to evaluate for possible biliary tract pathology which may have led to pancreatitis  - Will advance diet as tolerated pending clinical improvement.   2. Alcohol abuse  - Patient has not had alcohol since 7/25.   - Will start on CIWA protocol given prior history and potential for withdrawal.  3. DM-2  - Diabetes is diet controlled. - A1c on prior admission was 6.3 - Holding insulin at this time.   4. Tobacco abuse - Patient will be counseled on tobacco cessation. - Patient will be started on Nicotine patch   5. FEN/GI: IVF - NS @ 100 mL/hr.  Patient made NPO  6. DVT Prophylaxis: Heparin SQ  7. Disposition: Pending improvement in abdominal pain, advancement in PO intake, abdominal ultrasound results.   8. Code Status: Full code  History of Present Illness: Timothy Martinez is a 61 y.o. year old male with a PMH of alcoholism, diabetes, tobacco abuse, and chronic pancreatitis who presents with diffuse abdominal pain.    Timothy Martinez was recently admitted for acute pancreatitis on 7/26 - lipase on admission was 169.  He improved following IV fluids, bowel rest, and pain control.  He was discharged home on  7/30 with PO Oxycodone for pain control.   This evening following dinner (fish sticks), he developed severe, diffuse abdominal pain.  Patient reports that his pain was 10/10 severity with radiation to his back.  He took 1 Oxycodone tablet with little relief.  Pain continued to persist prompting visit to ED.  He denies recent alcohol use.  He reports that his last drink was 7/25 prior to previous admission.  He reports nausea, but denies vomiting.  No fever, chest pain, SOB, diarrhea, or constipation.  He did note sweating and chills.   Patient Active Problem List  Diagnosis  . Diabetes mellitus type II  . Chronic alcoholic pancreatitis  . Hyperlipidemia  . Tobacco abuse  . History of alcohol abuse  . Rectal bleeding  . Acute pancreatitis   Past Medical History: Past Medical History  Diagnosis Date  . Pancreas disorder   . Diabetes mellitus   . Hyperlipidemia   . Shortness of breath     with ambulation  . Anginal pain   . Pneumonia   . GERD (gastroesophageal reflux disease)   . Headache   . Arthritis    Past Surgical History: Past Surgical History  Procedure Date  . Back surgery   . Neck surgery   . Splenectomy    Social History: History  Substance Use Topics  . Smoking status: Current Everyday Smoker -- 1.5 packs/day for 45 years    Types: Cigarettes  . Smokeless tobacco: Never Used   Comment: trying to quitt. smoked 2 1/2 ppd before  .  Alcohol Use: 0.0 oz/week    6-7 Cans of beer per week     quit  05/28/11   For any additional social history documentation, please refer to relevant sections of EMR.  Family History: Family History  Problem Relation Age of Onset  . Diabetes Mother   . Aneurysm Mother   . Alcohol abuse Father    Allergies: Allergies  Allergen Reactions  . Morphine And Related Swelling  . Penicillins Rash   No current facility-administered medications on file prior to encounter.   Current Outpatient Prescriptions on File Prior to Encounter    Medication Sig Dispense Refill  . Aspirin-Salicylamide-Caffeine (BC HEADACHE POWDER PO) Take 1 packet by mouth daily as needed. For pain/headache      . folic acid (FOLVITE) 1 MG tablet Take 1 tablet (1 mg total) by mouth daily.  30 tablet  0  . Multiple Vitamins-Minerals (MULTIVITAMINS THER. W/MINERALS) TABS Take 1 tablet by mouth daily.      . ondansetron (ZOFRAN) 4 MG tablet Take 4 mg by mouth every 8 (eight) hours as needed. For nausea      . oxyCODONE (OXY IR/ROXICODONE) 5 MG immediate release tablet Take 1 tablet (5 mg total) by mouth every 4 (four) hours as needed for pain.  20 tablet  0  . sucralfate (CARAFATE) 1 G tablet Take 1 tablet (1 g total) by mouth 4 (four) times daily -  with meals and at bedtime.  30 tablet  0  . thiamine 100 MG tablet Take 100 mg by mouth daily.      Marland Kitchen thiamine 100 MG tablet Take 1 tablet (100 mg total) by mouth daily.  10 tablet  0   Review Of Systems:  Positive for nausea, abdominal pain, chills, sweating.  Otherwise 12 point review of systems was performed and was unremarkable.  Physical Exam: BP 131/76  Pulse 79  Temp 98.5 F (36.9 C) (Oral)  Resp 23  SpO2 95% Exam: General: alert and oriented. Resting comfortably in bed. NAD. HEENT: Normocephalic, atraumatic.  PERRLA. Moist mucous membranes. Cardiovascular: RRR. No murmurs, rubs, or gallops. Respiratory: CTAB. No rales, rhonchi, or wheeze.  Abdomen: soft, nondistended. Tender to palpation in mid-epigastrium/LUQ. No guarding or rebound. No organomegaly. Extremities: no cyanosis, clubbing, or edema.  Skin: warm, dry, intact. No rash. Neuro: alert & oriented x 3. No focal deficits.    Labs and Imaging: Results for orders placed during the hospital encounter of 02/25/12 (from the past 24 hour(s))  CBC WITH DIFFERENTIAL     Status: Abnormal   Collection Time   02/25/12 11:08 PM      Component Value Range   WBC 11.0 (*) 4.0 - 10.5 K/uL   RBC 5.27  4.22 - 5.81 MIL/uL   Hemoglobin 16.7  13.0 -  17.0 g/dL   HCT 48.4  39.0 - 52.0 %   MCV 91.8  78.0 - 100.0 fL   MCH 31.7  26.0 - 34.0 pg   MCHC 34.5  30.0 - 36.0 g/dL   RDW 14.1  11.5 - 15.5 %   Platelets 288  150 - 400 K/uL   Neutrophils Relative 62  43 - 77 %   Neutro Abs 6.5  1.7 - 7.7 K/uL   Lymphocytes Relative 24  12 - 46 %   Lymphs Abs 2.5  0.7 - 4.0 K/uL   Monocytes Relative 11  3 - 12 %   Monocytes Absolute 1.1 (*) 0.1 - 1.0 K/uL   Eosinophils Relative 3  0 - 5 %   Eosinophils Absolute 0.3  0.0 - 0.7 K/uL   Basophils Relative 1  0 - 1 %   Basophils Absolute 0.1  0.0 - 0.1 K/uL  COMPREHENSIVE METABOLIC PANEL     Status: Abnormal   Collection Time   02/25/12 11:08 PM      Component Value Range   Sodium 138  135 - 145 mEq/L   Potassium 4.7  3.5 - 5.1 mEq/L   Chloride 103  96 - 112 mEq/L   CO2 26  19 - 32 mEq/L   Glucose, Bld 257 (*) 70 - 99 mg/dL   BUN 7  6 - 23 mg/dL   Creatinine, Ser 0.76  0.50 - 1.35 mg/dL   Calcium 9.6  8.4 - 10.5 mg/dL   Total Protein 8.1  6.0 - 8.3 g/dL   Albumin 3.4 (*) 3.5 - 5.2 g/dL   AST 146 (*) 0 - 37 U/L   ALT 106 (*) 0 - 53 U/L   Alkaline Phosphatase 174 (*) 39 - 117 U/L   Total Bilirubin 0.6  0.3 - 1.2 mg/dL   GFR calc non Af Amer >90  >90 mL/min   GFR calc Af Amer >90  >90 mL/min  LIPASE, BLOOD     Status: Abnormal   Collection Time   02/25/12 11:08 PM      Component Value Range   Lipase 895 (*) 11 - 59 U/L  URINALYSIS, ROUTINE W REFLEX MICROSCOPIC     Status: Abnormal   Collection Time   02/25/12 11:46 PM      Component Value Range   Color, Urine YELLOW  YELLOW   APPearance CLEAR  CLEAR   Specific Gravity, Urine 1.014  1.005 - 1.030   pH 7.5  5.0 - 8.0   Glucose, UA 500 (*) NEGATIVE mg/dL   Hgb urine dipstick NEGATIVE  NEGATIVE   Bilirubin Urine NEGATIVE  NEGATIVE   Ketones, ur NEGATIVE  NEGATIVE mg/dL   Protein, ur NEGATIVE  NEGATIVE mg/dL   Urobilinogen, UA 4.0 (*) 0.0 - 1.0 mg/dL   Nitrite NEGATIVE  NEGATIVE   Leukocytes, UA NEGATIVE  NEGATIVE    Thersa Salt,  DO 02/26/2012, 2:58 AM  Patient seen, examined with Dr. Lacinda Axon.  Available data reviewed.  Briefly, this is a 61 year old male who was recently discharged from our service for acute on chronic pancreatitis.  He was doing well at home until Saturday evening after eating fish sticks and mash potatoes.  After eating a large meal, he developed LLQ pain that radiated to RLQ and to his back.  He also complains of nausea, but no vomiting or diarrhea.  In ED, patient's pain was controlled with Dilaudid and Lipase and liver enzymes were elevated.    Physical Exam: General: alert and oriented.  NAD. HEENT: Normocephalic, atraumatic.  PERRLA. Moist mucous membranes. Cardiovascular: RRR. No murmurs, rubs, or gallops. Respiratory: CTAB. No rales, rhonchi, or wheeze.  Abdomen: soft, nondistended. Tender to palpation LUQ. No guarding or rebound. No organomegaly. Extremities: no cyanosis, clubbing, or edema.  Skin: warm, dry, intact. No rash. Neuro: alert & oriented x 3. No focal deficits.     I agree with findings, assessment, and plan as outlined above.  Awaiting results of US abdomen to rule out gallbladder disease.  UA negative.  Will keep NPO for now and advance diet slowly.  Will treat pain with Morphine and transition to PO medications.  [employer], DO 02/26/2012 7:08  AM

## 2012-02-26 NOTE — H&P (Signed)
[facility] Attending Admit Note:  Annabell Sabal MD  [phone] pager  Family Practice pager:  [phone] I have seen and examined this patient and have reviewed their chart. I have discussed this patient with the resident. I agree with the resident's findings, assessment and care plan.  Briefly, 61 year old Male recently discharged for pancreatitis flare earlier this week on Tuesday.  Did well for past 3-4 days until last night when he began experiencing LLQ pain after eating fish sticks and mashed potatoes that became bilateral lower quadrant pain not relieved with home oxycodone.  No pain prior this week.  Had been tolerating good po intake of normal food.  In ED, elevated liver enzymes noted along with lipase >800.   Exam: Gen:  Alert, cooperative patient who appears stated age in no acute distress.  Vital signs reviewed. HEENT:  [place]/AT, MMM Cardiac:  Regular rate and rhythm without murmur auscultated.  Good S1/S2. Pulm:  Clear to auscultation bilaterally with good air movement.  No wheezes or rales noted.   Abdomen:  Soft/nondistended.  Mild LLQ pain.  Moderate epigastric and RUQ tenderness. Murphy's negative.  No guarding or rebound.  Bowel sounds good. Ext:  No clubbing/cyanosis/erythema.  No edema noted bilateral lower extremities.   Impression/Plan: 1.  Pancreatitis:  Has had multiple admissions for this in past year.  Acute on chronic issue.  May benefit from exogenous pancreatic enzyme supplementation (creon).  Will allow current flare to resolve and discuss once he has starting taking PO again.  Unclear etiology for flare -- negative triglycerides, no stone found on U/S.   EtOH would be most likely cause, but patient denies.  No levels on admit, likely has cleared system by now if he was drinking.  No signs of infection.  2.  Chronic alcoholism: on CIWA.  AST>ALT but not in 2:1 ratio.   For other chronic medical issues, agree with resident note. Alveda Reasons, MD

## 2012-02-27 LAB — COMPREHENSIVE METABOLIC PANEL
ALT: 67 U/L — ABNORMAL HIGH (ref 0–53)
AST: 40 U/L — ABNORMAL HIGH (ref 0–37)
Albumin: 3.5 g/dL (ref 3.5–5.2)
Alkaline Phosphatase: 144 U/L — ABNORMAL HIGH (ref 39–117)
BUN: 14 mg/dL (ref 6–23)
CO2: 23 mEq/L (ref 19–32)
Calcium: 9.7 mg/dL (ref 8.4–10.5)
Chloride: 101 mEq/L (ref 96–112)
Creatinine, Ser: 0.83 mg/dL (ref 0.50–1.35)
GFR calc Af Amer: >90 mL/min (ref >90)
GFR calc non Af Amer: >90 mL/min (ref >90)
Glucose, Bld: 113 mg/dL — ABNORMAL HIGH (ref 70–99)
Potassium: 4.8 mEq/L (ref 3.5–5.1)
Sodium: 137 mEq/L (ref 135–145)
Total Bilirubin: 0.8 mg/dL (ref 0.3–1.2)
Total Protein: 8 g/dL (ref 6.0–8.3)

## 2012-02-27 NOTE — Progress Notes (Signed)
I interviewed and examined this patient and discussed the care plan with Dr. Lacinda Axon and the [facility] team and agree with assessment and plan as documented in the progress note for today. He tolerated liquids well and is hungry for the regular food on the tray that was just delivered.   [place] A. Walker Kehr, MD Family Medicine Teaching Service Attending  02/27/2012 2:37 PM

## 2012-02-27 NOTE — Care Management Note (Signed)
  Page 1 of 1   02/27/2012     8:48:21 AM   CARE MANAGEMENT NOTE 02/27/2012  Patient:  Timothy Martinez, Timothy Martinez   Account Number:  [id number]  Date Initiated:  02/27/2012  Documentation initiated by:  Magdalen Spatz  Subjective/Objective Assessment:   DX: Acute pancreatitis  Secondary  Alcohol abuse     Action/Plan:   Anticipated DC Date:     Anticipated DC Plan:  HOME/SELF CARE         Choice offered to / List presented to:             Status of service:  In process, will continue to follow Medicare Important Message given?   (If response is "NO", the following Medicare IM given date fields will be blank) Date Medicare IM given:   Date Additional Medicare IM given:    Discharge Disposition:    Per UR Regulation:  Reviewed for med. necessity/level of care/duration of stay  If discussed at [place] of Stay Meetings, dates discussed:    Comments:

## 2012-02-27 NOTE — Progress Notes (Signed)
Family Medicine Teaching Service Daily Progress Note Service Page: [phone]  Patient Assessment: Timothy Martinez is a 60 year old male with a PMH of chronic pancreatitis who presented with an acute exacerbation of pancreatitis.   Subjective:  Patient is feeling well this a.m.  Pain is well controlled - Dilaudid 1 mg and Toradol last night.  Patient is pain free this morning.  No complaints.  He reports that he had a normal BM this morning.  Objective: Temp:  [97.3 F (36.3 C)-98.3 F (36.8 C)] 97.3 F (36.3 C) (08/05 0531) Pulse Rate:  [61-82] 82  (08/05 0531) Resp:  [17-18] 18  (08/05 0531) BP: (127-141)/(67-79) 127/67 mmHg (08/05 0531) SpO2:  [96 %-97 %] 97 % (08/05 0531)  Exam: General: awake and alert.  Was up out of bed when I entered the room. NAD. Cardiovascular: RRR. No murmurs, rubs, or gallops. Respiratory: CTAB. No rales, rhonchi, or wheeze. Abdomen: soft, nondistended.  Mildly tender to palpation LUQ, periumbilical region. +BS.  Extremities: No cyanosis, clubbing or edema noted.   I have reviewed the patient's medications, labs, imaging, and diagnostic testing.  Notable results are summarized below.  CBC BMET   Lab 02/26/12 1043 02/25/12 2308  WBC 9.7 11.0*  HGB 16.2 16.7  HCT 47.4 48.4  PLT 310 288    Lab 02/27/12 0600 02/26/12 1043 02/25/12 2308  NA 137 138 138  K 4.8 4.2 4.7  CL 101 103 103  CO2 23 29 26   BUN 14 7 7   CREATININE 0.83 0.86 0.76  GLUCOSE 113* 110* 257*  CALCIUM 9.7 9.2 9.6     CMP     Component Value Date/Time   NA 137 02/27/2012 0600   K 4.8 02/27/2012 0600   CL 101 02/27/2012 0600   CO2 23 02/27/2012 0600   GLUCOSE 113* 02/27/2012 0600   BUN 14 02/27/2012 0600   CREATININE 0.83 02/27/2012 0600   CALCIUM 9.7 02/27/2012 0600   PROT 8.0 02/27/2012 0600   ALBUMIN 3.5 02/27/2012 0600   AST 40* 02/27/2012 0600   ALT 67* 02/27/2012 0600   ALKPHOS 144* 02/27/2012 0600   BILITOT 0.8 02/27/2012 0600   GFRNONAA >90 02/27/2012 0600   GFRAA >90 02/27/2012 0600     Imaging/Diagnostic Tests: US Abdomen Complete  02/26/2012. COMPLETE ABDOMINAL ULTRASOUND  IMPRESSION: Mild gallbladder distension without stones, sludge, or wall thickening.  This is of doubtful significance. Surgical absence of the spleen.  Pancreas is obscured visualization.    Plan: 1. Pancreatitis - Pain is well controlled. Now on Oxycodone & Toradol. - Will advanced diet today as tolerated starting with clear liquids - Will decrease IV fluids as PO intake improves. - Zofran and Phenergan for nausea  2. Alcohol abuse  - Patient on CIWA protocol - change to daily   3. DM-2 - Diabetes is diet controlled - A1C on prior admission was 6.3. - No therapy at this time  4. Tobacco abuse - Will continue nicotine patch  FEN/GI: Currently 100 mL/hr.  Will decrease pending good PO intake PPx: Heparin SQ Dispo: Pending diet advancement and good pain control Code Status: Full Code  Thersa Salt, DO 02/27/2012, 7:16 AM

## 2012-02-27 NOTE — Discharge Summary (Signed)
[facility] Discharge Summary  Patient name: Timothy Martinez Medical record number: [id number] Date of birth: September 12, 1950 Age: 61 y.o. Gender: male Date of Admission: 02/25/2012  Date of Discharge: 02/29/12 Admitting Physician: Alveda Reasons, MD  Primary Care Provider: [name], MD  Indication for Hospitalization: Abdominal pain Discharge Diagnoses: Primary 1. Acute pancreatitis Secondary  1. Alcohol abuse 2. Diabetes Mellitus Type 2 3. Tobacco abuse  Brief Hospital Course:  Mr. Salsedo is a 61 year old gentlemen with PMH of alcohol abuse and chronic pancreatitis who presented with abdominal pain and acute pancreatitis. He was recently discharge from the hospital on 7/30 following admission for acute pancreatitis.  1. Acute pancreatitis On admission, lipase was 895 and clinical picture was consistent with pancreatitis.  AST, ALT, and Alkaline phosphatase were also elevated (146, 106, 174 respectively).  Patient denied recent alcohol use. Elevated liver enzymes and alkaline phosphatase were concerning for biliary tract disease.   Patient was admitted and treated with bowel rest, IV hydration, and pain control.  Abdominal ultrasound was obtained during admission and revealed no stones, sludge, or wall thickening.  Patient's pain improved quickly and he was transitioned from IV Dilaudid to PO oxycodone.  Nausea was minimal and was treated with phenergan and zofran. Diet was advanced as tolerated and at time of discharge patient was tolerating regular diet. Patient had minimal pain at time of discharge.  2. Alcohol abuse Patient was placed on CIWA protocol during admission given history of alcohol abuse.  He did not require any treatment during hospitalization.  3. Diabetes mellitus, type 2. Patients Diabetes is diet controlled. A1C obtained during prior admission was 6.3.  No insulin was needed during admission.  4. Tobacco abuse Nicotine patch was given  during hospitalization.  Will need outpatient follow up regarding smoking cessation.  Significant Labs and Imaging: CBC WITH DIFFERENTIAL     Status: Abnormal   Collection Time   02/25/12 11:08 PM      Component Value Range Comment   WBC 11.0 (*) 4.0 - 10.5 K/uL    RBC 5.27  4.22 - 5.81 MIL/uL    Hemoglobin 16.7  13.0 - 17.0 g/dL    HCT 48.4  39.0 - 52.0 %    MCV 91.8  78.0 - 100.0 fL    MCH 31.7  26.0 - 34.0 pg    MCHC 34.5  30.0 - 36.0 g/dL    RDW 14.1  11.5 - 15.5 %    Platelets 288  150 - 400 K/uL    Neutrophils Relative 62  43 - 77 %    Neutro Abs 6.5  1.7 - 7.7 K/uL    Lymphocytes Relative 24  12 - 46 %    Lymphs Abs 2.5  0.7 - 4.0 K/uL    Monocytes Relative 11  3 - 12 %    Monocytes Absolute 1.1 (*) 0.1 - 1.0 K/uL    Eosinophils Relative 3  0 - 5 %    Eosinophils Absolute 0.3  0.0 - 0.7 K/uL    Basophils Relative 1  0 - 1 %    Basophils Absolute 0.1  0.0 - 0.1 K/uL   COMPREHENSIVE METABOLIC PANEL     Status: Abnormal   Collection Time   02/25/12 11:08 PM      Component Value Range Comment   Sodium 138  135 - 145 mEq/L    Potassium 4.7  3.5 - 5.1 mEq/L    Chloride 103  96 -  112 mEq/L    CO2 26  19 - 32 mEq/L    Glucose, Bld 257 (*) 70 - 99 mg/dL    BUN 7  6 - 23 mg/dL    Creatinine, Ser 0.76  0.50 - 1.35 mg/dL    Calcium 9.6  8.4 - 10.5 mg/dL    Total Protein 8.1  6.0 - 8.3 g/dL    Albumin 3.4 (*) 3.5 - 5.2 g/dL    AST 146 (*) 0 - 37 U/L    ALT 106 (*) 0 - 53 U/L    Alkaline Phosphatase 174 (*) 39 - 117 U/L    Total Bilirubin 0.6  0.3 - 1.2 mg/dL    GFR calc non Af Amer >90  >90 mL/min    GFR calc Af Amer >90  >90 mL/min   LIPASE, BLOOD     Status: Abnormal   Collection Time   02/25/12 11:08 PM      Component Value Range Comment   Lipase 895 (*) 11 - 59 U/L   COMPREHENSIVE METABOLIC PANEL     Status: Abnormal   Collection Time   02/27/12  6:00 AM      Component Value Range Comment   Sodium 137  135 - 145 mEq/L    Potassium 4.8  3.5 - 5.1 mEq/L    Chloride 101   96 - 112 mEq/L    CO2 23  19 - 32 mEq/L    Glucose, Bld 113 (*) 70 - 99 mg/dL    BUN 14  6 - 23 mg/dL    Creatinine, Ser 0.83  0.50 - 1.35 mg/dL    Calcium 9.7  8.4 - 10.5 mg/dL    Total Protein 8.0  6.0 - 8.3 g/dL    Albumin 3.5  3.5 - 5.2 g/dL    AST 40 (*) 0 - 37 U/L    ALT 67 (*) 0 - 53 U/L    Alkaline Phosphatase 144 (*) 39 - 117 U/L    Total Bilirubin 0.8  0.3 - 1.2 mg/dL    GFR calc non Af Amer >90  >90 mL/min    GFR calc Af Amer >90  >90 mL/min    Abdominal Ultrasound 02/26/2012  IMPRESSION: Mild gallbladder distension without stones, sludge, or wall thickening.  This is of doubtful significance. Surgical absence of the spleen.  Pancreas is obscured visualization.   Procedures: None  Consultations: None  Discharge Medications:  Medication List  As of 02/29/2012  4:16 PM   STOP taking these medications         BC HEADACHE POWDER PO         TAKE these medications         folic acid 1 MG tablet   Commonly known as: FOLVITE   Take 1 tablet (1 mg total) by mouth daily.      multivitamins ther. w/minerals Tabs   Take 1 tablet by mouth daily.      ondansetron 4 MG tablet   Commonly known as: ZOFRAN   Take 4 mg by mouth every 8 (eight) hours as needed. For nausea      oxyCODONE 5 MG immediate release tablet   Commonly known as: Oxy IR/ROXICODONE   1-2 Tablets every 4-6 hours for moderate - severe pain      sucralfate 1 G tablet   Commonly known as: CARAFATE   Take 1 tablet (1 g total) by mouth 4 (four) times daily -  with meals and at bedtime.  thiamine 100 MG tablet   Take 100 mg by mouth daily.      thiamine 100 MG tablet   Take 1 tablet (100 mg total) by mouth daily.           Issues for Follow Up:  1. Alcohol and tobacco cessation counseling. 2. Continued resolution of symptoms 3. Consider starting statin therapy.  Last admission lipid panel revealed slightly elevated LDL of 113.   Outstanding Results: None  Discharge Instructions: Patient was  counseled important signs and symptoms that should prompt return to medical care, changes in medications, dietary instructions, and follow up appointments.  Significant instructions noted below:  Patient was educated on the importance of abstaining from using alcohol as this could trigger another acute pancreatitis.   Follow-up Information    Follow up with [name], MD on 03/05/2012. (3:45 pm.)    Contact information:   [address] [place] [phone]         Discharge Condition: Stable. Discharged home.  Thersa Salt, DO 02/29/2012, 4:16 PM

## 2012-02-28 NOTE — Progress Notes (Signed)
I interviewed and examined this patient and discussed the care plan with Dr. Lacinda Axon and the [facility] team and agree with assessment and plan as documented in the progress note for today.    [place] A. Walker Kehr, [place] Teaching Service Attending  02/28/2012 2:29 PM

## 2012-02-28 NOTE — Progress Notes (Addendum)
Family Medicine Teaching Service Daily Progress Note Service Page: [phone]  Patient Assessment: Timothy Martinez is a 61 year old male with a PMH of chronic pancreatitis who presented with an acute exacerbation of pancreatitis.   Subjective:  Patient is feeling well this a.m.  Did report episode of acute pain last night which resolved with PO Oxycodone and IV Toradol.  He reports that he had a normal BM this morning. Patient is ready to go home.  Objective: Temp:  [97.7 F (36.5 C)-97.9 F (36.6 C)] 97.9 F (36.6 C) (08/06 0550) Pulse Rate:  [60-85] 85  (08/06 0550) Resp:  [18-20] 20  (08/06 0550) BP: (127-138)/(77-95) 133/95 mmHg (08/06 0550) SpO2:  [96 %-98 %] 98 % (08/06 0550)  Exam: General: awake and alert.  Was up out of bed when I entered the room. NAD. Cardiovascular: RRR. No murmurs, rubs, or gallops. Respiratory: CTAB. No rales, rhonchi, or wheeze. Abdomen: soft, nondistended.  Mildly tender to palpation LUQ, periumbilical region. +BS.  Extremities: No cyanosis, clubbing or edema noted.   I have reviewed the patient's medications, labs, imaging, and diagnostic testing.  Notable results are summarized below.  CBC    Component Value Date/Time   WBC 9.7 02/26/2012 1043   RBC 5.12 02/26/2012 1043   HGB 16.2 02/26/2012 1043   HCT 47.4 02/26/2012 1043   PLT 310 02/26/2012 1043   MCV 92.6 02/26/2012 1043   MCH 31.6 02/26/2012 1043   MCHC 34.2 02/26/2012 1043   RDW 14.2 02/26/2012 1043   LYMPHSABS 2.5 02/25/2012 2308   MONOABS 1.1* 02/25/2012 2308   EOSABS 0.3 02/25/2012 2308   BASOSABS 0.1 02/25/2012 2308   CMP     Component Value Date/Time   NA 137 02/27/2012 0600   K 4.8 02/27/2012 0600   CL 101 02/27/2012 0600   CO2 23 02/27/2012 0600   GLUCOSE 113* 02/27/2012 0600   BUN 14 02/27/2012 0600   CREATININE 0.83 02/27/2012 0600   CALCIUM 9.7 02/27/2012 0600   PROT 8.0 02/27/2012 0600   ALBUMIN 3.5 02/27/2012 0600   AST 40* 02/27/2012 0600   ALT 67* 02/27/2012 0600   ALKPHOS 144* 02/27/2012 0600   BILITOT 0.8  02/27/2012 0600   GFRNONAA >90 02/27/2012 0600   GFRAA >90 02/27/2012 0600    Imaging/Diagnostic Tests: US Abdomen Complete  02/26/2012. COMPLETE ABDOMINAL ULTRASOUND  IMPRESSION: Mild gallbladder distension without stones, sludge, or wall thickening.  This is of doubtful significance. Surgical absence of the spleen.  Pancreas is obscured visualization.   Plan: 1. Pancreatitis - Pain is controlled via Oxycodone and Toradol - Tolerating regular diet. - Zofran and Phenergan for nausea - Discharge today given clinical improvement and good PO intake  2. Alcohol abuse  - Patient on CIWA protocol  3. DM-2 - Diabetes is diet controlled - A1C on prior admission was 6.3. - No therapy at this time  4. Tobacco abuse - Will continue nicotine patch  FEN/GI: Regular Diet PPx: Heparin SQ Dispo: Discharge home today  Code Status: Full Code  Thersa Salt, DO 02/28/2012, 12:38 PM  Addendum:    Timothy Martinez to see patient to discuss discharge instructions.  Patient was in severe pain and was reluctant to go home.  Patient will not be discharged today due to increasing pain.

## 2012-02-29 ENCOUNTER — Ambulatory Visit: Payer: Medicare Other | Admitting: Family Medicine

## 2012-02-29 MED ORDER — OXYCODONE HCL 5 MG PO TABS: ORAL_TABLET | ORAL | Status: DC

## 2012-02-29 NOTE — Progress Notes (Signed)
Family Medicine Teaching Service Daily Progress Note Service Page: [phone]  Patient Assessment: Timothy Martinez is a 61 year old male with a PMH of chronic pancreatitis who presented with an acute exacerbation of pancreatitis.   Subjective:  Patient was scheduled for discharge yesterday, but had an acute pain crisis and did not feel comfortable going home. Patient was kept overnight for monitoring and pain control.  This am patient is feeling well and is currently pain free. Patient is ready to go home today.  Objective: Temp:  [97.6 F (36.4 C)-98 F (36.7 C)] 97.6 F (36.4 C) (08/07 0545) Pulse Rate:  [63-68] 63  (08/07 0545) Resp:  [18-20] 18  (08/07 0545) BP: (117-132)/(63-76) 129/76 mmHg (08/07 0545) SpO2:  [96 %-99 %] 98 % (08/07 0545)  Exam: General: awake and alert.  Was up out of bed when I entered the room. NAD. Cardiovascular: RRR. No murmurs, rubs, or gallops. Respiratory: CTAB. No rales, rhonchi, or wheeze. Abdomen: soft, nondistended.  Mildly tender to palpation LUQ, periumbilical region. +BS.  Extremities: No cyanosis, clubbing or edema noted.   I have reviewed the patient's medications, labs, imaging, and diagnostic testing.  Notable results are summarized below.  CBC    Component Value Date/Time   WBC 9.7 02/26/2012 1043   RBC 5.12 02/26/2012 1043   HGB 16.2 02/26/2012 1043   HCT 47.4 02/26/2012 1043   PLT 310 02/26/2012 1043   MCV 92.6 02/26/2012 1043   MCH 31.6 02/26/2012 1043   MCHC 34.2 02/26/2012 1043   RDW 14.2 02/26/2012 1043   LYMPHSABS 2.5 02/25/2012 2308   MONOABS 1.1* 02/25/2012 2308   EOSABS 0.3 02/25/2012 2308   BASOSABS 0.1 02/25/2012 2308   CMP     Component Value Date/Time   NA 137 02/27/2012 0600   K 4.8 02/27/2012 0600   CL 101 02/27/2012 0600   CO2 23 02/27/2012 0600   GLUCOSE 113* 02/27/2012 0600   BUN 14 02/27/2012 0600   CREATININE 0.83 02/27/2012 0600   CALCIUM 9.7 02/27/2012 0600   PROT 8.0 02/27/2012 0600   ALBUMIN 3.5 02/27/2012 0600   AST 40* 02/27/2012 0600   ALT 67* 02/27/2012 0600   ALKPHOS 144* 02/27/2012 0600   BILITOT 0.8 02/27/2012 0600   GFRNONAA >90 02/27/2012 0600   GFRAA >90 02/27/2012 0600    Imaging/Diagnostic Tests: US Abdomen Complete  02/26/2012. COMPLETE ABDOMINAL ULTRASOUND  IMPRESSION: Mild gallbladder distension without stones, sludge, or wall thickening.  This is of doubtful significance. Surgical absence of the spleen.  Pancreas is obscured visualization.   Plan: 1. Pancreatitis - Pain is currently controlled with PO Oxycodone. Patient is pain free this am. - Tolerating regular diet. - Zofran and Phenergan for nausea - Discharge today given clinical improvement and good PO intake  2. Alcohol abuse  - Patient on CIWA protocol  3. DM-2 - Diabetes is diet controlled - A1C on prior admission was 6.3. - No therapy at this time  4. Tobacco abuse - Will continue nicotine patch  FEN/GI: Regular Diet PPx: Heparin SQ Dispo: Discharge home today  Code Status: Full Code  Thersa Salt, DO 02/29/2012, 8:02 AM

## 2012-02-29 NOTE — Progress Notes (Signed)
I interviewed and examined this patient and discussed the care plan with Dr. Lacinda Axon and the [facility] team and agree with assessment and plan as documented in the progress note for today.    [place] A. Walker Kehr, [place] Service Attending  02/29/2012 10:50 PM

## 2012-02-29 NOTE — Discharge Summary (Signed)
I interviewed and examined this patient and discussed the care plan with Dr. Lacinda Axon and the [facility] team and agree with assessment and plan as documented in the discharge note for today.    [place] A. Walker Kehr, [place] Service Attending  02/29/2012 10:46 PM

## 2012-02-29 NOTE — Progress Notes (Signed)
Patient discharged to home with instructions and verbalized understanding. 

## 2012-03-05 ENCOUNTER — Ambulatory Visit (INDEPENDENT_AMBULATORY_CARE_PROVIDER_SITE_OTHER): Payer: Medicare Other | Admitting: Family Medicine

## 2012-03-05 ENCOUNTER — Encounter: Payer: Self-pay | Admitting: Family Medicine

## 2012-03-05 VITALS — BP 129/75 | HR 91 | Temp 98.1°F | Ht 75.0 in | Wt 199.0 lb

## 2012-03-05 DIAGNOSIS — K860 Alcohol-induced chronic pancreatitis: Secondary | ICD-10-CM

## 2012-03-05 DIAGNOSIS — Z720 Tobacco use: Secondary | ICD-10-CM

## 2012-03-05 DIAGNOSIS — F17200 Nicotine dependence, unspecified, uncomplicated: Secondary | ICD-10-CM

## 2012-03-05 DIAGNOSIS — E785 Hyperlipidemia, unspecified: Secondary | ICD-10-CM

## 2012-03-05 DIAGNOSIS — K219 Gastro-esophageal reflux disease without esophagitis: Secondary | ICD-10-CM | POA: Insufficient documentation

## 2012-03-05 DIAGNOSIS — E119 Type 2 diabetes mellitus without complications: Secondary | ICD-10-CM

## 2012-03-05 DIAGNOSIS — F1011 Alcohol abuse, in remission: Secondary | ICD-10-CM

## 2012-03-05 DIAGNOSIS — K861 Other chronic pancreatitis: Secondary | ICD-10-CM

## 2012-03-05 MED ORDER — RANITIDINE HCL 150 MG PO TABS: 150.0000 mg | ORAL_TABLET | Freq: Two times a day (BID) | ORAL | Status: DC

## 2012-03-05 NOTE — Patient Instructions (Signed)
Dear Timothy Martinez,   It was great to see you today. Thank you for coming to clinic. Please read below regarding the issues that we discussed.   1. Please continue your efforts to not drink alcohol. Please call us if you need any further resources.  2. Please schedule an appointment with Dr. Valentina Lucks who can help you in your quest to quit smoking. Congratulations on cutting back from 2.5 to 1 pack per day.  3. Remember to come back to clinic if you are having worsening pain and are about to run out of your pain medications. You should need less and less everyday. I am encouraged that you only had to use 1 pill yesterday and today.  4. For your reflux, I am going to send in a prescription for you.  5. Please continue your daily walking. We would like to avoid you developing diabetes permanently. Below are some tips that can help you with preventing diabetes.   Please follow up in clinic in 12 weeks so we can follow up on your continued lifestyle changes to help prevent diabetes. Please call earlier if you have any questions or concerns.   Sincerely,  Dr. [name]  My 5 to Fitness!  5: fruits and vegetables per day (work on 9 per day if you are at 5) 4: exercise 4-5 times per week for at least 30 minutes (walking counts!) 3: meals per day (don't skip breakfast!) 2: habits to quit  -smoking  -excess alcohol use (men >2 beer/day; women >1beer/day) 1: sweet per day (2 cookies, 1 small cup of ice cream, 12 oz soda)  These are general tips for healthy living. Try to start with 1 or 2 habit TODAY and make it a part of your life for several months. Set a goal today!   Once you have 1 or 2 habits down for several months, try to begin working on your next healthy habit. With every single step you take, you will be leading a healthier lifestyle!

## 2012-03-06 NOTE — Assessment & Plan Note (Signed)
Started on Zantac

## 2012-03-06 NOTE — Assessment & Plan Note (Signed)
LDL 113. Patient to continue lifestyle changes. If A1c elevated greater than 6.5 on future visit and LDL not less than 100, Will discuss starting statin

## 2012-03-06 NOTE — Assessment & Plan Note (Signed)
Improved. Encourage continued cessation of alcohol. Also encouraged to quit smoking as this can independently cause flares of pancreatitis. Patient is tolerating by mouth and increasing what he is eating. Narcotic needs her decreasing

## 2012-03-06 NOTE — Assessment & Plan Note (Addendum)
patient did have an A1c of 6.8 during his first hospitalization which is diagnostic of diabetes. Unfortunately picture is clouded due to recurrent pancreatitis and possible pancreatic burnout during acute flares of pancreatitis. Given reduced number of admissions believe A1c of 6.3 is more reflective of patient's actual state of being at risk for diabetes but not actually having diabetes. We'll continue to follow A1c every 6 months and encourage lifestyle changes. Patient is to continue his walking regimen and watching what he eats.

## 2012-03-06 NOTE — Assessment & Plan Note (Signed)
Praised cessation efforts. Will continue to follow

## 2012-03-06 NOTE — Progress Notes (Signed)
Subjective:  Hospital followup for recurrent pancreatitis  1. recurrent pancreatitis-patient reports he continues to have some abdominal pain specifically in his epigastric and left upper quadrant area. He states fortunately he has had a decreasing requirement of narcotics. He only used one oxycodone yesterday and one this morning. He is tolerating slowly advancing his diet. Patient aware to come to our clinic pain worsening and he is running out of pain medicine.  2. Alcohol abuse-patient reports not drinking any alcohol since July 24. He is not requesting any additional support in this. He is aware that we are available to help him with resources if needed.  3. Tobacco abuse-patient has cut back from 2-1/2 packs per day to one pack per day at this time. He thinks it would be very difficult to further now but he knows he needs to to prevent pancreatitis. He is interested in meeting with Dr. Valentina Lucks  for further support  4. At risk for diabetes-last A1c was 6.3. Patient continues to walk regularly an attempt to watch the foods he is eating.  5. GERD-patient reports burning in his throat and chest after meals that is worse at night. No associated shortness of breath, diaphoresis, radiating pain to arm and neck.   ROS--See HPI  Past Medical History-smoking status noted: Current every day smoker.  Reviewed problem list.  Medications- reviewed and updated Chief complaint-noted  Objective:  Gen: NAD, resting comfortably in chair, smells of smoke CV: RRR no mrg Lungs: CTAB Abd: Soft nondistended. Mild diffuse tenderness with moderate tenderness in the epigastric and left upper quadrant. MSK: No edema Skin: Warm and dry  Assessment/Plan: See problem oriented charted

## 2012-03-06 NOTE — Assessment & Plan Note (Signed)
Needs assistance with cutting back further. Plans to make appointment to meet with Dr. Valentina Lucks.

## 2012-03-12 ENCOUNTER — Ambulatory Visit: Payer: Medicare Other | Admitting: Pharmacist

## 2012-03-12 ENCOUNTER — Telehealth: Payer: Self-pay | Admitting: Family Medicine

## 2012-03-12 NOTE — Telephone Encounter (Signed)
Pt is needing refill on his pain meds - pls call when ready   He has 1 left

## 2012-03-12 NOTE — Telephone Encounter (Signed)
Patient needs an appointment. This was discussed at last visit that he would need to be reevaluated if running out of pain medications.   Will ask admin to call patient to advise of need for appointment.

## 2012-03-13 ENCOUNTER — Telehealth: Payer: Self-pay | Admitting: Family Medicine

## 2012-03-13 NOTE — Telephone Encounter (Signed)
Patient is calling for a refill on his Oxycodone, he has scheduled an appt for 9/3 which is the soonest Dr. Yong Channel had.  He said he only takes the pain med once a day and he only has 1 left.  He was originally prescribed 20 on 8/7.

## 2012-03-14 NOTE — Telephone Encounter (Signed)
Once again, patient needs an appointment, will not prescribe narcotics without office visit. Patient needs to be reevaluated. He can have an appointment with another provider as same day or regularly scheduled, does not have to be seen by me. This was discussed at last visit that he would need to be reevaluated if running out of pain medications.   Will ask admin to call patient to advise of need for appointment.

## 2012-03-27 ENCOUNTER — Ambulatory Visit (INDEPENDENT_AMBULATORY_CARE_PROVIDER_SITE_OTHER): Payer: Medicare Other | Admitting: Family Medicine

## 2012-03-27 ENCOUNTER — Encounter: Payer: Self-pay | Admitting: Family Medicine

## 2012-03-27 VITALS — BP 119/73 | HR 91 | Temp 97.3°F | Ht 75.0 in | Wt 202.0 lb

## 2012-03-27 DIAGNOSIS — Z789 Other specified health status: Secondary | ICD-10-CM

## 2012-03-27 DIAGNOSIS — F1011 Alcohol abuse, in remission: Secondary | ICD-10-CM

## 2012-03-27 DIAGNOSIS — E663 Overweight: Secondary | ICD-10-CM | POA: Insufficient documentation

## 2012-03-27 DIAGNOSIS — K861 Other chronic pancreatitis: Secondary | ICD-10-CM

## 2012-03-27 DIAGNOSIS — Z6825 Body mass index (BMI) 25.0-25.9, adult: Secondary | ICD-10-CM

## 2012-03-27 DIAGNOSIS — K219 Gastro-esophageal reflux disease without esophagitis: Secondary | ICD-10-CM

## 2012-03-27 DIAGNOSIS — Z9189 Other specified personal risk factors, not elsewhere classified: Secondary | ICD-10-CM

## 2012-03-27 DIAGNOSIS — F17200 Nicotine dependence, unspecified, uncomplicated: Secondary | ICD-10-CM

## 2012-03-27 DIAGNOSIS — K860 Alcohol-induced chronic pancreatitis: Secondary | ICD-10-CM

## 2012-03-27 DIAGNOSIS — Z720 Tobacco use: Secondary | ICD-10-CM

## 2012-03-27 MED ORDER — OXYCODONE HCL 5 MG PO TABS: ORAL_TABLET | ORAL | Status: DC

## 2012-03-27 NOTE — Assessment & Plan Note (Signed)
Improved with Zantac. Continue current therapy.

## 2012-03-27 NOTE — Assessment & Plan Note (Signed)
Continue walking and watching diet. Weight is creeping up but suspect thsi is due to appetite returning as patient no longer having to take zofran.

## 2012-03-27 NOTE — Assessment & Plan Note (Signed)
Encouraged cessation. Patient not ready but believes he may be by time he meets with Dr. Valentina Lucks to discuss cessation.

## 2012-03-27 NOTE — Assessment & Plan Note (Addendum)
Given 10,  5mg  oxycodone. Patient is to use only 1/2 tablet in AM as needed for pain. Patient aware this is the last time we will prescribe narcotics in the office for his pain  Plan to recheck CMET at yearly physical given elevated LFTS on last 3 visits.

## 2012-03-27 NOTE — Assessment & Plan Note (Signed)
Strongly encouraged AA meetings given relapse. Patient can also request additional help from [place] here for other resources but plans to use AA at this time.

## 2012-03-27 NOTE — Assessment & Plan Note (Signed)
Encouraged continued walking for this and at risk for DM.

## 2012-03-27 NOTE — Progress Notes (Signed)
Subjective:   1. recurrent pancreatitis-only in the morning after breakfast mainly, patient continues to have some abdominal pain specifically in his epigastric and left upper quadrant area. 4/10 in intensity. Patient eating eggs, grits, cereal typically, no different with different diet.   2. Alcohol abuse-2 beers since last visit. Patient has not been to any AA meetings and does not have support. Warned patient that his pancreatitis can flare due to this and it could be the reason for the above pain. Found an [place] group that meets near his house and plans to start soon.   3. Tobacco abuse-up to 1.5 ppd from 1 ppd since last visit. Cancelled visit with Dr. Valentina Lucks.   4. At risk for diabetes-last A1c was 6.3. Patient continues to walk regularly an attempt to watch the foods he is eating.  5. GERD-improved since starting ranitidine BID. No side effects of medication, tolerating well, and taking regurlarly.   ROS--See HPI  Past Medical History-smoking status noted: current smoker.  Reviewed problem list.  Medications- reviewed and updated Chief complaint-noted  Objective: BP 119/73  Pulse 91  Temp 97.3 F (36.3 C) (Oral)  Ht 6\' 3"  (1.905 m)  Wt 202 lb (91.627 kg)  BMI 25.25 kg/m2 Gen: NAD CV: RRR no mrg Lungs: CTAB Abd: pain noted with palpation of epigastric region. Otherwise, soft, nontender, nondistended. Normal bowel sounds.  Skin: warm, dry.   Assessment/Plan: See problem oriented charted

## 2012-03-27 NOTE — Patient Instructions (Signed)
Dear Timothy Martinez,   It was great to see you today. Thank you for coming to clinic. Please read below regarding the issues that we discussed.   1. For your pancreatitis, the most important thing is for you to never drink alcohol again. Please go to the [place] meetings as you have planned. Remember 60 meetings in 60 days is very important.  2. I am going to give you lower dose of pain medicine to get you through the pain in the mornings. This will be the last time we prescribe that pain medicine.  3. Remember to make an appointment with Dr. Valentina Lucks to discuss quitting smoking.   Please follow up in clinic in as tolerated. Within the next 6 months, let's have you in for a complete physical.  Please call earlier if you have any questions or concerns.   Sincerely,  Dr. [name]   My 5 to Fitness! These are tips I give to every patient that  are important for living a healthy life!   5: fruits and vegetables per day (work on 9 per day if you are at 5) 4: exercise 4-5 times per week for at least 30 minutes (walking counts!) 3: meals per day (don't skip breakfast!) 2: habits to quit  -smoking  -any alcohol use  1: sweet per day (2 cookies, 1 small cup of ice cream, 12 oz soda)  These are general tips for healthy living. Try to start with 1 or 2 habit TODAY and make it a part of your life for several months. Once you have 1 or 2 habits down for several months, try to begin working on your next healthy habit. With every single step you take, you will be leading a healthier lifestyle!   Health Maintenance Due  Topic Date Due  . Tetanus/tdap  10/27/1970  . Colonoscopy  10/26/2001  . Zostavax  10/27/2011

## 2012-04-09 ENCOUNTER — Ambulatory Visit (INDEPENDENT_AMBULATORY_CARE_PROVIDER_SITE_OTHER): Payer: Medicare Other | Admitting: Pharmacist

## 2012-04-09 ENCOUNTER — Encounter: Payer: Self-pay | Admitting: Pharmacist

## 2012-04-09 ENCOUNTER — Telehealth: Payer: Self-pay | Admitting: Family Medicine

## 2012-04-09 VITALS — BP 149/68 | HR 74 | Ht 75.0 in | Wt 204.5 lb

## 2012-04-09 DIAGNOSIS — Z720 Tobacco use: Secondary | ICD-10-CM

## 2012-04-09 DIAGNOSIS — F17200 Nicotine dependence, unspecified, uncomplicated: Secondary | ICD-10-CM

## 2012-04-09 MED ORDER — BUPROPION HCL ER (XL) 150 MG PO TB24: 150.0000 mg | ORAL_TABLET | Freq: Every day | ORAL | Status: DC

## 2012-04-09 NOTE — Assessment & Plan Note (Signed)
Currently moderate Nicotine Dependence with history of severe dependence of 48 years duration in a patient who is fair candidate for success b/c of current level of motivation and lack of previous quit attempts. Initiated bupropion tx 150 XL daily. Denies history of seizures. Patient counseled on purpose, proper use, and potential adverse effects, including insomnia, and potential change in mood.  Goals by next appointment: <1 ppd and leave cigarettes in the house while mowing the yard.   Written information provided. F/U Rx Clinic Visit on 10/3.  Total time in face-to-face counseling 25 minutes.  Patient seen with Dewitt Hoes, PharmD Candidate.

## 2012-04-09 NOTE — Telephone Encounter (Signed)
Patient wants to know who he went to for his colonoscopy.  He was told to have one and can't remember who he saw for the last one.  Please call him.

## 2012-04-09 NOTE — Progress Notes (Signed)
  Subjective:    Patient ID: Timothy Martinez, male    DOB: [birth year], 61 y.o.   MRN: [id number]  HPI Patient arrives in good spirits for tobacco cessation counseling.  Age when started using tobacco on a daily basis 13.  [place]ised [place] Number of Cigarettes per day 30. Brand smoked [employer]. Estimated Nicotine Content per Cigarette (mg) 0.8. Previously smoked [place] and [place]. Estimated Nicotine intake per day 20-25 mg.   Smokes first cigarette 10 minutes after waking. Denies waking to smoke.    Estimated Fagerstrom Score >5/10.  Most recent quit attempt during hospitalization about 1 month ago. Longest time ever been tobacco free 44 days (back in 2003 while hospitalized).  Smoked at home prior to arriving and then again immediately after discharge. What Medications (NRT, bupropion, varenicline) used in past includes nicotine patch.  Rates IMPORTANCE of quitting tobacco on 1-10 scale of 6. Rates READINESS of quitting tobacco on 1-10 scale of 4. Rates CONFIDENCE of quitting tobacco on 1-10 scale of 2. Triggers to use tobacco include; smell of cigarettes, eating breakfast and lunch, drinking alcohol.   Patient encouraged by Dr. Yong Channel and briefly seen by Dr. Yong Channel today.   Review of Systems     Objective:   Physical Exam        Assessment & Plan:  Currently moderate Nicotine Dependence with history of severe dependence of 48 years duration in a patient who is fair candidate for success b/c of current level of motivation and lack of previous quit attempts. Initiated bupropion tx 150 XL daily. Denies history of seizures. Patient counseled on purpose, proper use, and potential adverse effects, including insomnia, and potential change in mood.  Goals by next appointment: <1 ppd and leave cigarettes in the house while mowing the yard.   Written information provided. F/U Rx Clinic Visit on 10/3 for tobacco followup and spirometry.  Total time in face-to-face counseling  25 minutes.  Patient seen with Dewitt Hoes, PharmD Candidate.

## 2012-04-09 NOTE — Telephone Encounter (Signed)
Patient seen by Dr. Benson Norway in the past, called [facility] and left message for her to call me back in regards to referral.

## 2012-04-09 NOTE — Progress Notes (Signed)
Patient ID: Timothy Martinez, male   DOB: Nov 09, 1950, 61 y.o.   MRN: [id number]  Reviewed and agree with Dr. Graylin Shiver documentation and management.

## 2012-04-09 NOTE — Patient Instructions (Addendum)
Start taking buproprion XL 150 mg daily in the morning.  Follow up with me on October 3rd at 9 AM for spirometry and tobacco follow-up.   Goals by next appointment: <1 ppd and leave cigarettes in the house while mowing the yard.

## 2012-04-09 NOTE — Telephone Encounter (Signed)
Left message on patient voicemail, for patient to call [facility] to set up appointment.

## 2012-04-26 ENCOUNTER — Encounter: Payer: Self-pay | Admitting: Pharmacist

## 2012-04-26 ENCOUNTER — Ambulatory Visit (INDEPENDENT_AMBULATORY_CARE_PROVIDER_SITE_OTHER): Payer: Medicare Other | Admitting: Pharmacist

## 2012-04-26 VITALS — BP 126/76 | HR 88 | Ht 75.0 in | Wt 201.0 lb

## 2012-04-26 DIAGNOSIS — R0609 Other forms of dyspnea: Secondary | ICD-10-CM

## 2012-04-26 DIAGNOSIS — R0602 Shortness of breath: Secondary | ICD-10-CM | POA: Insufficient documentation

## 2012-04-26 DIAGNOSIS — Z720 Tobacco use: Secondary | ICD-10-CM

## 2012-04-26 DIAGNOSIS — R0600 Dyspnea, unspecified: Secondary | ICD-10-CM

## 2012-04-26 DIAGNOSIS — R0989 Other specified symptoms and signs involving the circulatory and respiratory systems: Secondary | ICD-10-CM

## 2012-04-26 DIAGNOSIS — F17200 Nicotine dependence, unspecified, uncomplicated: Secondary | ICD-10-CM

## 2012-04-26 NOTE — Assessment & Plan Note (Signed)
Reviewed results of pulmonary function tests (machine interpretation = normal). Spirometry evaluation reveals mild/moderate obstructive lung disease corresponding to GOLD Classification 2 based on spirometry FEV1/FVC of 67% AND A based on mMRC score of <2. Pt verbalized understanding of results.  Written pt instructions provided.  F/U Clinic visit in November. Total time in face to face counseling 20 minutes.  Patient seen with Wenda Low, PharmD Candidate, Chrisandra Netters, MD.  Patient has been experiencing shortness of breath on exertion for several years and currently in the preparation/action for tobacco cessation. He has successfully reduced his daily use of cigarettes to 30/day from 35/day. History of severe Nicotine Dependence of 48 years duration in a patient who has been decreasing cigarette use to 30/day. He is good candidate for success because of family support, engagement in new activities and pharmacologic replacement.    Initiated nicotine replacement tx with nicotine lozenges 4mg  PO daily use of up to 7 lozenges/day. Patient counseled on purpose, proper use, and potential adverse effects. Written information provided. Total time in face-to-face counseling 20 minutes.  Patient seen with Wenda Low, PharmD Candidate and Chrisandra Netters, MD.

## 2012-04-26 NOTE — Progress Notes (Signed)
Patient ID: Timothy Martinez, male   DOB: Sep 10, 1950, 61 y.o.   MRN: [id number] Reviewed and agree with Dr. Graylin Shiver documentation and management.

## 2012-04-26 NOTE — Patient Instructions (Addendum)
Thank you for coming in today and doing the lung function assessment. We learned today from your spirometry test that your lung age is about 4 years. I am glad to hear that you have made some progress in cutting down the number of cigarettes you smoke every day. Please continue to do activities that you enjoy that will help you continue to cut down on your tobacco use. Please stop taking the bupropion tablets and start taking the nicotine lozenges which can be purchased at any local pharmacy. Please call next week and schedule an appointment in November. Your goal is to cut down to 1 pack a day.

## 2012-04-26 NOTE — Progress Notes (Signed)
  Subjective:    Patient ID: Timothy Martinez, male    DOB: September 04, 1950, 61 y.o.   MRN: [id number]  HPI Patient arrives for tobacco cessation follow-up and lung function assessment. He endorses continued interest in smoking cessation and complains of intolerance to bupropion which was previously prescribed for tobacco cessation. Recent re-hospitalizations of sister due to recurrent lung cancer relating to chronic use of tobacco and his two sisters' success in cessation continues to drive his motivation to quit. He has engaged in gardening and puzzle activities to avoid tobacco use.    Review of Systems     Objective:   Physical Exam mMRC score= <2  See Documentation Flowsheet (discrete results - PFTs) for complete Spirometry results. Patient provided good effort while attempting spirometry.   Lung Age = 70 years   Patient wished to return bupropion pills that were unused. Bupropion XL 150mg  removed from patient possession and sent for destruction.       Assessment & Plan:  Reviewed results of pulmonary function tests (machine interpretation = normal). Spirometry evaluation reveals mild/moderate obstructive lung disease corresponding to GOLD Classification 2 based on spirometry FEV1/FVC of 67% AND A based on mMRC score of <2. Pt verbalized understanding of results.  Written pt instructions provided.  F/U Clinic visit in November. Total time in face to face counseling 20 minutes.  Patient seen with Wenda Low, PharmD Candidate, Chrisandra Netters, MD.  Patient has been experiencing shortness of breath on exertion for several years and currently in the preparation/action for tobacco cessation. He has successfully reduced his daily use of cigarettes to 30/day from 35/day. History of severe Nicotine Dependence of 48 years duration in a patient who has been decreasing cigarette use to 30/day. He is good candidate for success because of family support, engagement in new activities and pharmacologic  replacement.    Initiated nicotine replacement tx with nicotine lozenges 4mg  PO daily use of up to 7 lozenges/day. Patient counseled on purpose, proper use, and potential adverse effects. Written information provided. Total time in face-to-face counseling 20 minutes.  Patient seen with Wenda Low, PharmD Candidate and Chrisandra Netters, MD.  .

## 2012-04-27 ENCOUNTER — Encounter: Payer: Self-pay | Admitting: Family Medicine

## 2012-04-27 DIAGNOSIS — J439 Emphysema, unspecified: Secondary | ICD-10-CM | POA: Insufficient documentation

## 2012-06-16 ENCOUNTER — Emergency Department (HOSPITAL_COMMUNITY): Payer: Medicare Other

## 2012-06-16 ENCOUNTER — Other Ambulatory Visit: Payer: Self-pay

## 2012-06-16 ENCOUNTER — Encounter (HOSPITAL_COMMUNITY): Payer: Self-pay | Admitting: Emergency Medicine

## 2012-06-16 ENCOUNTER — Emergency Department (HOSPITAL_COMMUNITY)
Admission: EM | Admit: 2012-06-16 | Discharge: 2012-06-16 | Disposition: A | Discharge status: Home / Self Care | Payer: Medicare Other | Attending: Emergency Medicine | Admitting: Emergency Medicine

## 2012-06-16 DIAGNOSIS — F17200 Nicotine dependence, unspecified, uncomplicated: Secondary | ICD-10-CM | POA: Insufficient documentation

## 2012-06-16 DIAGNOSIS — E785 Hyperlipidemia, unspecified: Secondary | ICD-10-CM | POA: Insufficient documentation

## 2012-06-16 DIAGNOSIS — K219 Gastro-esophageal reflux disease without esophagitis: Secondary | ICD-10-CM | POA: Insufficient documentation

## 2012-06-16 DIAGNOSIS — Z8701 Personal history of pneumonia (recurrent): Secondary | ICD-10-CM | POA: Insufficient documentation

## 2012-06-16 DIAGNOSIS — R05 Cough: Secondary | ICD-10-CM | POA: Insufficient documentation

## 2012-06-16 DIAGNOSIS — K861 Other chronic pancreatitis: Secondary | ICD-10-CM | POA: Insufficient documentation

## 2012-06-16 DIAGNOSIS — E119 Type 2 diabetes mellitus without complications: Secondary | ICD-10-CM | POA: Insufficient documentation

## 2012-06-16 DIAGNOSIS — R059 Cough, unspecified: Secondary | ICD-10-CM | POA: Insufficient documentation

## 2012-06-16 DIAGNOSIS — R0789 Other chest pain: Secondary | ICD-10-CM

## 2012-06-16 DIAGNOSIS — IMO0001 Reserved for inherently not codable concepts without codable children: Secondary | ICD-10-CM | POA: Insufficient documentation

## 2012-06-16 DIAGNOSIS — R071 Chest pain on breathing: Secondary | ICD-10-CM | POA: Insufficient documentation

## 2012-06-16 DIAGNOSIS — Z79899 Other long term (current) drug therapy: Secondary | ICD-10-CM | POA: Insufficient documentation

## 2012-06-16 DIAGNOSIS — Z8739 Personal history of other diseases of the musculoskeletal system and connective tissue: Secondary | ICD-10-CM | POA: Insufficient documentation

## 2012-06-16 DIAGNOSIS — Z8679 Personal history of other diseases of the circulatory system: Secondary | ICD-10-CM | POA: Insufficient documentation

## 2012-06-16 DIAGNOSIS — J40 Bronchitis, not specified as acute or chronic: Secondary | ICD-10-CM | POA: Diagnosis not present

## 2012-06-16 LAB — BASIC METABOLIC PANEL
BUN: 10 mg/dL (ref 6–23)
CO2: 24 mEq/L (ref 19–32)
Calcium: 9.6 mg/dL (ref 8.4–10.5)
Chloride: 101 mEq/L (ref 96–112)
Creatinine, Ser: 0.83 mg/dL (ref 0.50–1.35)
GFR calc Af Amer: >90 mL/min (ref >90)
GFR calc non Af Amer: >90 mL/min (ref >90)
Glucose, Bld: 114 mg/dL — ABNORMAL HIGH (ref 70–99)
Potassium: 4.4 mEq/L (ref 3.5–5.1)
Sodium: 137 mEq/L (ref 135–145)

## 2012-06-16 LAB — CBC
HCT: 48.2 % (ref 39.0–52.0)
Hemoglobin: 16.5 g/dL (ref 13.0–17.0)
MCH: 31.6 pg (ref 26.0–34.0)
MCHC: 34.2 g/dL (ref 30.0–36.0)
MCV: 92.3 fL (ref 78.0–100.0)
Platelets: 253 10*3/uL (ref 150–400)
RBC: 5.22 MIL/uL (ref 4.22–5.81)
RDW: 15.2 % (ref 11.5–15.5)
WBC: 8.3 10*3/uL (ref 4.0–10.5)

## 2012-06-16 LAB — POCT I-STAT TROPONIN I: Troponin i, poc: 0.01 ng/mL (ref 0.00–0.08)

## 2012-06-16 LAB — TROPONIN I: Troponin I: <0.3 ng/mL (ref <0.30)

## 2012-06-16 MED ORDER — ASPIRIN EC 325 MG PO TBEC
325.0000 mg | DELAYED_RELEASE_TABLET | Freq: Once | ORAL | Status: AC
  Administered 2012-06-16: 325 mg via ORAL
  Filled 2012-06-16: qty 1

## 2012-06-16 MED ORDER — KETOROLAC TROMETHAMINE 60 MG/2ML IM SOLN
60.0000 mg | Freq: Once | INTRAMUSCULAR | Status: AC
  Administered 2012-06-16: 60 mg via INTRAMUSCULAR
  Filled 2012-06-16: qty 2

## 2012-06-16 MED ORDER — KETOROLAC TROMETHAMINE 30 MG/ML IJ SOLN: 30.0000 mg | Freq: Once | INTRAMUSCULAR | Status: DC

## 2012-06-16 NOTE — ED Provider Notes (Signed)
History     CSN: [id number]  Arrival date & time 06/16/12  1401   First MD Initiated Contact with Patient 06/16/12 1502     Chief Complaint  Patient presents with  . Chest Pain   HPI: Timothy Martinez is a 61 yo CM with history of chronic pancreatitis, DM, HLD and GERD who presents with left chest and arm pain. Symptoms started three days ago while laying in bed. Pain was insidious onset, located in the left chest, describes as "someone punching me," constant, non-radiating, severe initially. Pain worse with laying on his left side, improved with walking and laying flat. Associated with diaphoresis three days ago. Denies nausea, vomiting, dizziness, SOB or increased cough from baseline. Further denies fever, lower extremity swelling or orthopnea. He also has pain in his left bicep. Describes as aching, constant, non-radiating. Pain in his left arm is not the same pain as in his chest. He was splitting wood in the days preceding his chest pain. He is concerned he may be having a heart attack so he presents for evaluation. Underwent stress test last year, negative per patient report. Risk factors for ACS include tobacco abuse, DM, HLD.   Past Medical History  Diagnosis Date  . Pancreas disorder   . Diabetes mellitus   . Hyperlipidemia   . Shortness of breath     with ambulation  . Anginal pain   . Pneumonia   . GERD (gastroesophageal reflux disease)   . Headache   . Arthritis     Past Surgical History  Procedure Date  . Back surgery   . Neck surgery   . Splenectomy     Family History  Problem Relation Age of Onset  . Diabetes Mother   . Aneurysm Mother   . Alcohol abuse Father     History  Substance Use Topics  . Smoking status: Current Every Day Smoker -- 1.5 packs/day for 45 years    Types: Cigarettes    Start date: 04/09/1964  . Smokeless tobacco: Never Used     Comment: trying to quit. smoked 3 ppd before  . Alcohol Use: 0.0 oz/week    6-7 Cans of beer per week   Comment: quit  05/28/11      Review of Systems  Constitutional: Negative for fever, chills, appetite change and fatigue.  HENT: Negative for congestion and rhinorrhea.   Eyes: Negative for photophobia and visual disturbance.  Respiratory: Positive for cough (baseline cough, no worsening). Negative for chest tightness, shortness of breath and wheezing.   Cardiovascular: Positive for chest pain. Negative for palpitations and leg swelling.  Gastrointestinal: Negative for nausea, vomiting, abdominal pain and diarrhea.  Genitourinary: Negative for dysuria, urgency, hematuria and difficulty urinating.  Musculoskeletal: Positive for myalgias (left arm pain). Negative for arthralgias.  Skin: Negative for color change and pallor.  Neurological: Negative for dizziness, numbness and headaches.  Psychiatric/Behavioral: Negative for confusion and agitation.  All other systems reviewed and are negative.   Allergies  Morphine and related and Penicillins  Home Medications   Current Outpatient Rx  Name  Route  Sig  Dispense  Refill  . IBUPROFEN 200 MG PO TABS   Oral   Take 400 mg by mouth 2 (two) times daily as needed. For chest pain         . THERA M PLUS PO TABS   Oral   Take 1 tablet by mouth daily.         Marland Kitchen RANITIDINE HCL 150 MG PO  TABS   Oral   Take 150 mg by mouth daily.         Marland Kitchen ONDANSETRON HCL 4 MG PO TABS   Oral   Take 1 tablet (4 mg total) by mouth every 6 (six) hours as needed for nausea.   20 tablet   0     BP 134/69  Pulse 93  Temp 98.3 F (36.8 C) (Oral)  Resp 18  SpO2 96%  Physical Exam  Nursing note and vitals reviewed. Constitutional: He is oriented to person, place, and time. He appears well-developed and well-nourished. He is cooperative.  HENT:  Head: Normocephalic and atraumatic.  Mouth/Throat: Oropharynx is clear and moist and mucous membranes are normal.  Eyes: EOM are normal. Pupils are equal, round, and reactive to light.  Neck: Trachea  normal and normal range of motion. Neck supple. No JVD present.  Cardiovascular: Normal rate, regular rhythm, S1 normal, S2 normal, normal heart sounds and normal pulses.   Pulmonary/Chest: Effort normal and breath sounds normal. He has no wheezes. He exhibits tenderness (over left chest).  Abdominal: Soft. Normal appearance and bowel sounds are normal. There is no tenderness.  Neurological: He is alert and oriented to person, place, and time. He has normal strength and normal reflexes. No cranial nerve deficit or sensory deficit. Gait normal. GCS eye subscore is 4. GCS verbal subscore is 5. GCS motor subscore is 6.   ED Course  Procedures (including critical care time)  Labs Reviewed  BASIC METABOLIC PANEL - Abnormal; Notable for the following:    Glucose, Bld 114 (*)     All other components within normal limits  CBC  TROPONIN I  POCT I-STAT TROPONIN I   Dg Chest 2 View  06/16/2012  *RADIOLOGY REPORT*  Clinical Data: Chest pain and left arm pain.  Headaches.  History diabetes.  Smoker.  CHEST - 2 VIEW  Comparison: 05/28/2011  Findings: Midline trachea.  Normal heart size and mediastinal contours. No pleural effusion or pneumothorax.  Diffuse peribronchial thickening.  No lobar consolidation.  Upper lobe pleural parenchymal scarring again identified.  IMPRESSION: 1.  No acute cardiopulmonary disease. 2.  Mild peribronchial thickening which likely relates to chronic bronchitis or smoking.   Original Report Authenticated By: Abigail Miyamoto, M.D.    1. Chest wall pain     MDM  60 yo CM with history of chronic pancreatitis, DM, HLD and GERD who presents with left chest and arm pain. Afebrile, vital signs stable. Presentation most c/w muscular or lung pathology. ACS less likely based on history. EKG without acute ischemia. Will evaluate with troponin, labs and CXR. Doubt PE as low risk by Well's, again history not typical of PE.   Troponin negative, labs unremarkable. Chest x-ray without acute  process. Pain improved with IM Toradol. Pain likely MSK in origin. Return precautions to include worsening pain, SOB, syncope, or other concerning symptoms were given. Advised to f/u with his PCP early next week for reevaluation. He is in agreement with plan and voiced understanding.   Reviewed imaging, labs, EKG, and previous medical records, utilized in MDM  Discussed case with Dr. Rogene Houston  Clinical Impression 1. Chest wall pain.   EKG: SR, rate 94, non-specific T wave changes in leads III, avR and aVF. Flattened T wave in V1. No ST depression or elevation.     Louretta Shorten, MD 06/17/12 [phone]

## 2012-06-16 NOTE — ED Provider Notes (Signed)
I saw and evaluated the patient, reviewed the resident's note and I agree with the findings and plan.  Patient seen by me. Patient with constant chest pain for the past 3 days. Section been improving of late. Troponins x2 were negative. Chest x-ray results are pending if negative patient can be discharged home. Patient has a history of pancreatitis in the past. Today's events not consistent with prolonged unstable angina or acute cardiac event.  EKG reviewed by me and agree with the resident's interpretation.    Mervin Kung, MD 06/16/12 [phone]

## 2012-06-16 NOTE — ED Notes (Signed)
Pt. Stated. i started having chest pain on Wednesday night.  Its started in my lt arm tingling.

## 2012-06-17 NOTE — ED Provider Notes (Signed)
I saw and evaluated the patient, reviewed the resident's note and I agree with the findings and plan.    Mervin Kung, MD 06/17/12 [phone]

## 2012-07-09 ENCOUNTER — Ambulatory Visit: Payer: Medicare Other | Admitting: Family Medicine

## 2012-08-21 ENCOUNTER — Ambulatory Visit (INDEPENDENT_AMBULATORY_CARE_PROVIDER_SITE_OTHER): Payer: Medicare Other | Admitting: Family Medicine

## 2012-08-21 ENCOUNTER — Encounter: Payer: Self-pay | Admitting: Family Medicine

## 2012-08-21 VITALS — BP 128/78 | HR 86 | Temp 98.2°F | Ht 75.0 in | Wt 205.0 lb

## 2012-08-21 DIAGNOSIS — F17200 Nicotine dependence, unspecified, uncomplicated: Secondary | ICD-10-CM

## 2012-08-21 DIAGNOSIS — R3589 Other polyuria: Secondary | ICD-10-CM

## 2012-08-21 DIAGNOSIS — R358 Other polyuria: Secondary | ICD-10-CM | POA: Insufficient documentation

## 2012-08-21 DIAGNOSIS — Z789 Other specified health status: Secondary | ICD-10-CM

## 2012-08-21 DIAGNOSIS — F1011 Alcohol abuse, in remission: Secondary | ICD-10-CM

## 2012-08-21 DIAGNOSIS — Z79899 Other long term (current) drug therapy: Secondary | ICD-10-CM

## 2012-08-21 DIAGNOSIS — Z6825 Body mass index (BMI) 25.0-25.9, adult: Secondary | ICD-10-CM

## 2012-08-21 DIAGNOSIS — Z720 Tobacco use: Secondary | ICD-10-CM

## 2012-08-21 DIAGNOSIS — Z9189 Other specified personal risk factors, not elsewhere classified: Secondary | ICD-10-CM

## 2012-08-21 DIAGNOSIS — E663 Overweight: Secondary | ICD-10-CM

## 2012-08-21 DIAGNOSIS — R079 Chest pain, unspecified: Secondary | ICD-10-CM

## 2012-08-21 LAB — POCT URINALYSIS DIPSTICK
Bilirubin, UA: NEGATIVE
Blood, UA: NEGATIVE
Glucose, UA: NEGATIVE
Ketones, UA: NEGATIVE
Leukocytes, UA: NEGATIVE
Nitrite, UA: NEGATIVE
Protein, UA: NEGATIVE
Spec Grav, UA: 1.015
Urobilinogen, UA: 0.2
pH, UA: 7

## 2012-08-21 LAB — POCT GLYCOSYLATED HEMOGLOBIN (HGB A1C): Hemoglobin A1C: 7

## 2012-08-21 NOTE — Patient Instructions (Addendum)
1. Alcohol-congratulations on quitting! I think the odouls once a week is reasonable. The vitamins we give for people with a history of alcoholism are Thiamine (vitamin b1) 100mg  daily and Folic acid 1mg  daily.  2. Chest pain-I am glad you haven't had any more chest pain. Remember you should be evaluated soon after chest pain starts and remember we talked about going to the emegency room or calling 911 if it is related to moving around and gets better with rest or feels like pressure in the middle of your chest.  3. For your abdominal pain-keep a journal over the next month for me (including bowel habits/intensity of pain/things it is related to). Come back in 1 month if it has not resolved or sooner if pain worsens.  Finally, I want to talk more about you peeing frequently as this could be related to an enlarged prostate. We will likely need to do a prostate exam at your next visit.   I am sorry you have had so many things going on with your family and friends. You will definitely be in my thoughts and prayers.  Dr. Yong Channel  Health Maintenance Due  Topic Date Due  . Tetanus/tdap  10/27/1970  . Colonoscopy  10/26/2001  . Zostavax  10/27/2011  . Influenza Vaccine  03/25/2012

## 2012-08-22 DIAGNOSIS — R079 Chest pain, unspecified: Secondary | ICD-10-CM | POA: Insufficient documentation

## 2012-08-22 NOTE — Assessment & Plan Note (Signed)
Evaluated in ED only. lPain resolved with pain medication and has been chest pain free since. DId not give nitroglycerin at this time but advised quick follow up in the future if this reoccurs and would consider stressing based on presentation. Given reasons to return for evaluation.

## 2012-08-22 NOTE — Assessment & Plan Note (Addendum)
Patient deferred discussions of prostate and prostate exam today (will complete next visit). A1c is concerning for diabetes and may be cause of polyuria (will call patient and inform likely needs to start metformin). UA unremarkable for UTI.

## 2012-08-22 NOTE — Progress Notes (Signed)
Subjective:   1. Hospital follow up for chest pain-patient seen in ED on 06/16/12 for chest pain of 3 days duration. Troponin neg x2. EKG without ischemic changes. THought to be MSK pain. Patient given a "shot" which he isnt sure what it is and has been chest pain free every since. ALso denies sohortness of breath, palpitations, diaphoresis (with exception of recently had the flu with fever and sweating).   2. ALcoholism-patient has remained alcohol free since September visit. Encouraged patient to consider a different AA as he had a bad experience. No abdominal swelling or LUQ tenderness. He has been using 1 odouls per week.   3. Tobacco-down to 1 PPD but wants to stay steady with this as he gets through stressful time in life (many recent illnesses in family). No shortness of breath. Chest pain as above but notne since ED visit.   4. POlyuria-no known history BPH. Patient with increased urination though not dysuria over last 2-3 months. NOcturia as well.   ROS--See HPI , in addition patient metnions 4/10 epigastric pain every morning that resolves by noontime. He thinks he is having normal bowel movements. Does have history of splenectomy for unknown reason. Patient also mentions polyuria without burning.   Past Medical History-history chronic alcoholic pancreatitis. HLD. GERD, COPD Reviewed problem list.  Medications- reviewed and updated Chief complaint-noted  Objective: BP 128/78  Pulse 86  Temp 98.2 F (36.8 C) (Oral)  Ht 6\' 3"  (1.905 m)  Wt 205 lb (92.987 kg)  BMI 25.62 kg/m2 Gen: NAD, resting comfortably on table CV: RRR no murmurs rubs or gallops Chest wall: no pain to palpation.  Lungs: CTAB no crackles, wheeze, rhonchi Skin: warm, dry Abd: soft//nondistended/normal bowel sounds. Very minimal epigastric tenderness.  Neuro: grossly normal, moves all extremities  Assessment/Plan:  For abdominal pain x 1 month duration, patient to journal x 1 month including exacerbating and  inciting factors (food, bowel movements, etc) and return at that time if not resolved.

## 2012-08-22 NOTE — Assessment & Plan Note (Signed)
Praised progress down to 1 PPD especially given life circumstances. Not ready to quit completely today but wants to continue to slowly cut down. Encouraged patient to continue great progress.

## 2012-08-22 NOTE — Assessment & Plan Note (Signed)
No relapse since September. Did go to 2 AA meetings but had bad experience (stated people brought alcohol outside). Praised patient on abstinence. He states he felt better on thiamine and folic acid so advised patient to start taking again.

## 2012-09-02 ENCOUNTER — Encounter: Payer: Self-pay | Admitting: Family Medicine

## 2012-09-27 ENCOUNTER — Ambulatory Visit (INDEPENDENT_AMBULATORY_CARE_PROVIDER_SITE_OTHER): Payer: Medicare Other | Admitting: Family Medicine

## 2012-09-27 ENCOUNTER — Ambulatory Visit (HOSPITAL_COMMUNITY)
Admission: RE | Admit: 2012-09-27 | Discharge: 2012-09-27 | Disposition: A | Discharge status: Home / Self Care | Payer: Medicare Other | Source: Ambulatory Visit | Attending: Family Medicine | Admitting: Family Medicine

## 2012-09-27 ENCOUNTER — Encounter: Payer: Self-pay | Admitting: Family Medicine

## 2012-09-27 VITALS — BP 144/75 | HR 91 | Temp 97.9°F | Ht 75.0 in | Wt 211.0 lb

## 2012-09-27 DIAGNOSIS — R0781 Pleurodynia: Secondary | ICD-10-CM

## 2012-09-27 DIAGNOSIS — F1011 Alcohol abuse, in remission: Secondary | ICD-10-CM

## 2012-09-27 DIAGNOSIS — R071 Chest pain on breathing: Secondary | ICD-10-CM | POA: Insufficient documentation

## 2012-09-27 DIAGNOSIS — Z720 Tobacco use: Secondary | ICD-10-CM

## 2012-09-27 DIAGNOSIS — R109 Unspecified abdominal pain: Secondary | ICD-10-CM

## 2012-09-27 DIAGNOSIS — F17200 Nicotine dependence, unspecified, uncomplicated: Secondary | ICD-10-CM

## 2012-09-27 DIAGNOSIS — R079 Chest pain, unspecified: Secondary | ICD-10-CM

## 2012-09-27 DIAGNOSIS — J984 Other disorders of lung: Secondary | ICD-10-CM | POA: Diagnosis not present

## 2012-09-27 LAB — COMPREHENSIVE METABOLIC PANEL
ALT: 15 U/L (ref 0–53)
AST: 19 U/L (ref 0–37)
Albumin: 4.4 g/dL (ref 3.5–5.2)
Alkaline Phosphatase: 82 U/L (ref 39–117)
BUN: 14 mg/dL (ref 6–23)
CO2: 27 mEq/L (ref 19–32)
Calcium: 10.1 mg/dL (ref 8.4–10.5)
Chloride: 98 mEq/L (ref 96–112)
Creat: 0.88 mg/dL (ref 0.50–1.35)
Glucose, Bld: 152 mg/dL — ABNORMAL HIGH (ref 70–99)
Potassium: 4.6 mEq/L (ref 3.5–5.3)
Sodium: 136 mEq/L (ref 135–145)
Total Bilirubin: 0.7 mg/dL (ref 0.3–1.2)
Total Protein: 8.2 g/dL (ref 6.0–8.3)

## 2012-09-27 LAB — LIPASE: Lipase: 14 U/L (ref 0–75)

## 2012-09-27 LAB — HEPATITIS C ANTIBODY, REFLEX: HCV Ab: NEGATIVE

## 2012-09-27 NOTE — Patient Instructions (Addendum)
We are going to get a chest x-ray for your pain that is around your rib and some labs. We may need to move forward with further evaluation like a CT scan of the chest and possibly abdomen in the future.  I encourage you to quit smoking. When you become ready, please let me know (I'll ask each time we see each other).  There are a # of other issues that we need to discuss so I would like to have you back at my next available appointment (diabetes, frequent urination, health maintenance, cholesterol, need for aspirin).   Thanks, Dr. Yong Channel

## 2012-09-28 DIAGNOSIS — R109 Unspecified abdominal pain: Secondary | ICD-10-CM | POA: Insufficient documentation

## 2012-09-28 NOTE — Assessment & Plan Note (Signed)
Praised continued cessation. Patient refuses AA due to poor experience.

## 2012-09-28 NOTE — Progress Notes (Signed)
Subjective:   1. Upper abdominal/rib pain-history pancreatitis with last sever bout in 2012. Over last 2 months has had bilateral upper abdomen (usually spares epigastric area) and described as sharp pain around his bottom ribs anteriorly. Has not worsened in intensity but usually 4-5/10 lasting for about 5 hours each morning. Worse if sitting in recliner, seems to be better with standing. Not associated with meals. No radiation of pain. Ibuprofen does not help at 400 mg BID.   No nausea/vomiting/fevers/chills/night sweats/unintentiaonal weight loss/hemoptysis/diarrhea/constipation.   2. Alcoholism-continues to avoid alcohol completely. Has nonalcoholic odouls once weekly. Has had poor experience at [place] nad does not desire to go.   3. Tobacco abuse-rates importance of quitting 8/10. Readiness 0/10 and confidence that he could quit 0/10. Has seen Dr. Valentina Lucks x2 previously and refuses to see again. No chest pain except for ib pain described above. No shortness of breath.   ROS--See HPI  Past Medical History Patient Active Problem List  Diagnosis  . Diabetes Mellitus Type II  . Recurrent alcoholic pancreatitis  . Hyperlipidemia  . Tobacco abuse  . History of alcohol abuse  . GERD (gastroesophageal reflux disease)  . Overweight (BMI 25.0-29.9)  . Dyspnea  . COPD (chronic obstructive pulmonary disease)   Reviewed problem list.  Medications- reviewed and updated Chief complaint-noted  Objective: BP 144/75  Pulse 91  Temp(Src) 97.9 F (36.6 C) (Oral)  Ht 6\' 3"  (1.905 m)  Wt 211 lb (95.709 kg)  BMI 26.37 kg/m2 Gen: NAD, resting comfortably on table, smells of smoke CV: RRR no murmurs rubs or gallops Chest wall: no pain to palpation.  Lungs: CTAB no crackles, wheeze, rhonchi Skin: warm, dry Abd: soft//nondistended/normal bowel sounds. Very minimal tenderness LUQ and RUQ and over lower ribs. No rebounding or guarding.  Neuro: grossly normal, moves all  extremities  Assessment/Plan:   Patient to return to discuss the following: 1. DM and need for DM nutritionist 2. Lipids-LDL 113 and goal 100 3. Need for aspirin-encouraged patient to start 4. Hypertension 5. Frequent urination

## 2012-09-28 NOTE — Assessment & Plan Note (Deleted)
Reports chest pain free.

## 2012-09-28 NOTE — Assessment & Plan Note (Addendum)
LUQ and RUQ. Only mild abdominal pain on exam.  Patient's main concern is cancer (no systemic signs such as fatigue, weakness, night sweats, weight loss). Expressed that smoking cessation is most important step if he wants to prevent cancer. Already on Zantac.   Hep C screen negative. CXR without any masses. CMET with normalization of LFTs after quitting smoking. Lipase normal. If pain persists, could consider low dose CT scan of chest as chest most likely area for malignancy with smoking history. Also could consider CT abdomen/pelvis. Alternatively, could consider MSK cause and try icing and stronger NSAID.

## 2012-09-28 NOTE — Assessment & Plan Note (Addendum)
Still 1 ppd down from 2.5 PPD. Patient technically pre contemplative as readiness 0/10. Counseled patient on importance of cessation. Will continue to encourage cessation. Refused to see Dr. Valentina Lucks in pharmacy clinic.

## 2012-10-01 NOTE — Progress Notes (Signed)
You are correct, if patient is not ready to quit smoking or at least moving towards that directions he would not be in [facility].

## 2012-10-02 NOTE — Progress Notes (Signed)
The patient would only be in the cohort if he is ready to start making changes towards smoking cessation.

## 2012-10-08 ENCOUNTER — Encounter: Payer: Self-pay | Admitting: Family Medicine

## 2012-10-29 ENCOUNTER — Ambulatory Visit: Payer: Medicare Other | Admitting: Family Medicine

## 2012-10-31 ENCOUNTER — Encounter (HOSPITAL_COMMUNITY): Payer: Self-pay | Admitting: Emergency Medicine

## 2012-10-31 ENCOUNTER — Inpatient Hospital Stay (HOSPITAL_COMMUNITY)
Admission: EM | Admit: 2012-10-31 | Discharge: 2012-11-03 | DRG: 440 | Disposition: A | Discharge status: Home / Self Care | Payer: Medicare Other | Attending: Family Medicine | Admitting: Family Medicine

## 2012-10-31 DIAGNOSIS — Z720 Tobacco use: Secondary | ICD-10-CM

## 2012-10-31 DIAGNOSIS — E785 Hyperlipidemia, unspecified: Secondary | ICD-10-CM

## 2012-10-31 DIAGNOSIS — K859 Acute pancreatitis without necrosis or infection, unspecified: Principal | ICD-10-CM | POA: Diagnosis present

## 2012-10-31 DIAGNOSIS — R748 Abnormal levels of other serum enzymes: Secondary | ICD-10-CM | POA: Diagnosis present

## 2012-10-31 DIAGNOSIS — R0600 Dyspnea, unspecified: Secondary | ICD-10-CM

## 2012-10-31 DIAGNOSIS — K219 Gastro-esophageal reflux disease without esophagitis: Secondary | ICD-10-CM | POA: Diagnosis present

## 2012-10-31 DIAGNOSIS — J449 Chronic obstructive pulmonary disease, unspecified: Secondary | ICD-10-CM

## 2012-10-31 DIAGNOSIS — M129 Arthropathy, unspecified: Secondary | ICD-10-CM | POA: Diagnosis present

## 2012-10-31 DIAGNOSIS — R030 Elevated blood-pressure reading, without diagnosis of hypertension: Secondary | ICD-10-CM | POA: Diagnosis present

## 2012-10-31 DIAGNOSIS — F17200 Nicotine dependence, unspecified, uncomplicated: Secondary | ICD-10-CM | POA: Diagnosis present

## 2012-10-31 DIAGNOSIS — K861 Other chronic pancreatitis: Secondary | ICD-10-CM | POA: Diagnosis present

## 2012-10-31 DIAGNOSIS — F1020 Alcohol dependence, uncomplicated: Secondary | ICD-10-CM | POA: Diagnosis present

## 2012-10-31 DIAGNOSIS — R079 Chest pain, unspecified: Secondary | ICD-10-CM

## 2012-10-31 DIAGNOSIS — E663 Overweight: Secondary | ICD-10-CM | POA: Diagnosis present

## 2012-10-31 DIAGNOSIS — R109 Unspecified abdominal pain: Secondary | ICD-10-CM

## 2012-10-31 DIAGNOSIS — R358 Other polyuria: Secondary | ICD-10-CM

## 2012-10-31 DIAGNOSIS — R3589 Other polyuria: Secondary | ICD-10-CM

## 2012-10-31 DIAGNOSIS — Z6825 Body mass index (BMI) 25.0-25.9, adult: Secondary | ICD-10-CM

## 2012-10-31 DIAGNOSIS — K860 Alcohol-induced chronic pancreatitis: Secondary | ICD-10-CM

## 2012-10-31 DIAGNOSIS — E1110 Type 2 diabetes mellitus with ketoacidosis without coma: Secondary | ICD-10-CM

## 2012-10-31 DIAGNOSIS — E119 Type 2 diabetes mellitus without complications: Secondary | ICD-10-CM | POA: Diagnosis present

## 2012-10-31 DIAGNOSIS — J4489 Other specified chronic obstructive pulmonary disease: Secondary | ICD-10-CM | POA: Diagnosis present

## 2012-10-31 DIAGNOSIS — F1011 Alcohol abuse, in remission: Secondary | ICD-10-CM

## 2012-10-31 DIAGNOSIS — R10819 Abdominal tenderness, unspecified site: Secondary | ICD-10-CM | POA: Diagnosis not present

## 2012-10-31 DIAGNOSIS — K2970 Gastritis, unspecified, without bleeding: Secondary | ICD-10-CM | POA: Diagnosis not present

## 2012-10-31 LAB — CBC
HCT: 45.7 % (ref 39.0–52.0)
Hemoglobin: 15.5 g/dL (ref 13.0–17.0)
MCH: 30.5 pg (ref 26.0–34.0)
MCHC: 33.9 g/dL (ref 30.0–36.0)
MCV: 90 fL (ref 78.0–100.0)
Platelets: 204 10*3/uL (ref 150–400)
RBC: 5.08 MIL/uL (ref 4.22–5.81)
RDW: 14.8 % (ref 11.5–15.5)
WBC: 13 10*3/uL — ABNORMAL HIGH (ref 4.0–10.5)

## 2012-10-31 LAB — COMPREHENSIVE METABOLIC PANEL
ALT: 84 U/L — ABNORMAL HIGH (ref 0–53)
AST: 99 U/L — ABNORMAL HIGH (ref 0–37)
Albumin: 3.8 g/dL (ref 3.5–5.2)
Alkaline Phosphatase: 121 U/L — ABNORMAL HIGH (ref 39–117)
BUN: 12 mg/dL (ref 6–23)
CO2: 24 mEq/L (ref 19–32)
Calcium: 9.4 mg/dL (ref 8.4–10.5)
Chloride: 103 mEq/L (ref 96–112)
Creatinine, Ser: 0.76 mg/dL (ref 0.50–1.35)
GFR calc Af Amer: >90 mL/min (ref >90)
GFR calc non Af Amer: >90 mL/min (ref >90)
Glucose, Bld: 147 mg/dL — ABNORMAL HIGH (ref 70–99)
Potassium: 4.1 mEq/L (ref 3.5–5.1)
Sodium: 138 mEq/L (ref 135–145)
Total Bilirubin: 0.9 mg/dL (ref 0.3–1.2)
Total Protein: 8.3 g/dL (ref 6.0–8.3)

## 2012-10-31 LAB — LIPASE, BLOOD: Lipase: 1206 U/L — ABNORMAL HIGH (ref 11–59)

## 2012-10-31 LAB — CBC WITH DIFFERENTIAL/PLATELET
Basophils Absolute: 0.1 10*3/uL (ref 0.0–0.1)
Basophils Relative: 1 % (ref 0–1)
Eosinophils Absolute: 0.3 10*3/uL (ref 0.0–0.7)
Eosinophils Relative: 2 % (ref 0–5)
HCT: 48.3 % (ref 39.0–52.0)
Hemoglobin: 16.8 g/dL (ref 13.0–17.0)
Lymphocytes Relative: 25 % (ref 12–46)
Lymphs Abs: 2.8 10*3/uL (ref 0.7–4.0)
MCH: 31.1 pg (ref 26.0–34.0)
MCHC: 34.8 g/dL (ref 30.0–36.0)
MCV: 89.3 fL (ref 78.0–100.0)
Monocytes Absolute: 1.2 10*3/uL — ABNORMAL HIGH (ref 0.1–1.0)
Monocytes Relative: 11 % (ref 3–12)
Neutro Abs: 7 10*3/uL (ref 1.7–7.7)
Neutrophils Relative %: 62 % (ref 43–77)
Platelets: 224 10*3/uL (ref 150–400)
RBC: 5.41 MIL/uL (ref 4.22–5.81)
RDW: 14.9 % (ref 11.5–15.5)
WBC: 11.4 10*3/uL — ABNORMAL HIGH (ref 4.0–10.5)

## 2012-10-31 LAB — LACTATE DEHYDROGENASE: LDH: 181 U/L (ref 94–250)

## 2012-10-31 LAB — AMYLASE: Amylase: 670 U/L — ABNORMAL HIGH (ref 0–105)

## 2012-10-31 MED ORDER — HYDROMORPHONE HCL PF 1 MG/ML IJ SOLN
0.5000 mg | INTRAMUSCULAR | Status: DC | PRN
  Administered 2012-10-31 – 2012-11-01 (×6): 0.5 mg via INTRAVENOUS
  Administered 2012-11-02: 14:00:00 via INTRAVENOUS
  Administered 2012-11-02 (×2): 0.5 mg via INTRAVENOUS
  Filled 2012-10-31 (×9): qty 1

## 2012-10-31 MED ORDER — ACETAMINOPHEN 650 MG RE SUPP: 650.0000 mg | Freq: Four times a day (QID) | RECTAL | Status: DC | PRN

## 2012-10-31 MED ORDER — DEXTROSE-NACL 5-0.9 % IV SOLN
INTRAVENOUS | Status: DC
  Administered 2012-10-31 – 2012-11-02 (×7): via INTRAVENOUS

## 2012-10-31 MED ORDER — LORAZEPAM 2 MG/ML IJ SOLN: 1.0000 mg | Freq: Four times a day (QID) | INTRAMUSCULAR | Status: DC | PRN

## 2012-10-31 MED ORDER — OXYCODONE HCL 5 MG PO TABS
10.0000 mg | ORAL_TABLET | ORAL | Status: DC | PRN
  Administered 2012-11-01: 10 mg via ORAL
  Filled 2012-10-31: qty 2

## 2012-10-31 MED ORDER — SODIUM CHLORIDE 0.9 % IV BOLUS (SEPSIS)
1000.0000 mL | Freq: Once | INTRAVENOUS | Status: AC
  Administered 2012-10-31: 1000 mL via INTRAVENOUS

## 2012-10-31 MED ORDER — ACETAMINOPHEN 325 MG PO TABS: 650.0000 mg | ORAL_TABLET | Freq: Four times a day (QID) | ORAL | Status: DC | PRN

## 2012-10-31 MED ORDER — FAMOTIDINE 20 MG PO TABS
20.0000 mg | ORAL_TABLET | Freq: Every day | ORAL | Status: DC
  Administered 2012-10-31 – 2012-11-03 (×4): 20 mg via ORAL
  Filled 2012-10-31 (×4): qty 1

## 2012-10-31 MED ORDER — HEPARIN SODIUM (PORCINE) 5000 UNIT/ML IJ SOLN
5000.0000 [IU] | Freq: Three times a day (TID) | INTRAMUSCULAR | Status: DC
  Administered 2012-10-31 – 2012-11-03 (×8): 5000 [IU] via SUBCUTANEOUS
  Filled 2012-10-31 (×11): qty 1

## 2012-10-31 MED ORDER — VITAMIN B-1 100 MG PO TABS
100.0000 mg | ORAL_TABLET | Freq: Every day | ORAL | Status: DC
  Administered 2012-10-31 – 2012-11-03 (×4): 100 mg via ORAL
  Filled 2012-10-31 (×4): qty 1

## 2012-10-31 MED ORDER — HYDROMORPHONE HCL PF 1 MG/ML IJ SOLN
1.0000 mg | Freq: Once | INTRAMUSCULAR | Status: AC
  Administered 2012-10-31: 1 mg via INTRAVENOUS
  Filled 2012-10-31: qty 1

## 2012-10-31 MED ORDER — OXYCODONE HCL 5 MG PO TABS
5.0000 mg | ORAL_TABLET | ORAL | Status: DC | PRN
  Administered 2012-10-31: 5 mg via ORAL
  Filled 2012-10-31: qty 1

## 2012-10-31 MED ORDER — LORAZEPAM 1 MG PO TABS
1.0000 mg | ORAL_TABLET | Freq: Four times a day (QID) | ORAL | Status: DC | PRN
  Administered 2012-10-31: 1 mg via ORAL
  Filled 2012-10-31: qty 1

## 2012-10-31 NOTE — H&P (Addendum)
[facility] Admission History and Physical Service Pager: [phone]  Patient name: Timothy Martinez Medical record number: [id number] Date of birth: 02-25-1951 Age: 62 y.o. Gender: male  Primary Care Provider: [name], MD  Chief Complaint: Abdominal Pain  History of Present Illness: Timothy Martinez is a 62 y.o. year old male presenting with abdominal pain for 3 days. His abdominal pain began 3 days ago and has progressively worsened since then. He describes it as sharp in a large ring like distribution around his abdomen radiating to his back. It is a 10/10 currently and goes down to an 7/10 when he gets IV pain meds. He has not eaten in 24 hours because of pain. He had some nausea this am with dry retching but no vomiting. He was tolerating soup PO until about 24 hours ago which he says he is not eating because of pain. He tried to drive to the ED today but had to stop and call EMS to take him to the ED. Touching his abdomen and the bumps on the road worsened the pain. He notes that he has had pancreatitis before and drank for 3 days straight preceding the onset. He drank approx 2 cases of beer over the 3 days. He has been sober since September and states that this is the only time he has drank since then.   He denies headache, vision change, chest pain, dyspnea, hematochezia, melena, dysuria, and edema. He has been cutting back on smoking but is still smoking  1.5 PPD.   Patient Active Problem List  Diagnosis  . Diabetes Mellitus Type II  . Chronic alcoholic pancreatitis  . Hyperlipidemia  . Tobacco abuse  . History of alcohol abuse  . GERD (gastroesophageal reflux disease)  . Overweight (BMI 25.0-29.9)  . Dyspnea  . COPD (chronic obstructive pulmonary disease)  . Chest pain  . Abdominal pain, unspecified site   Past Medical History: Past Medical History  Diagnosis Date  . Pancreas disorder   . Diabetes mellitus   . Hyperlipidemia   . Shortness of  breath     with ambulation  . Anginal pain   . Pneumonia   . GERD (gastroesophageal reflux disease)   . Headache   . Arthritis    Past Surgical History: Past Surgical History  Procedure Laterality Date  . Back surgery    . Neck surgery    . Splenectomy     Social History: History  Substance Use Topics  . Smoking status: Current Every Day Smoker -- 1.50 packs/day for 45 years    Types: Cigarettes    Start date: 04/09/1964  . Smokeless tobacco: Never Used     Comment: trying to quit. smoked 3 ppd before  . Alcohol Use: 0.0 oz/week    6-7 Cans of beer per week     Comment: quit  05/28/11   For any additional social history documentation, please refer to relevant sections of EMR.  Family History: Family History  Problem Relation Age of Onset  . Diabetes Mother   . Aneurysm Mother   . Alcohol abuse Father    Allergies: Allergies  Allergen Reactions  . Morphine And Related Swelling    Pt states he can tolerate oxycodone and hydromorphone  . Penicillins Rash   No current facility-administered medications on file prior to encounter.   Current Outpatient Prescriptions on File Prior to Encounter  Medication Sig Dispense Refill  . Multiple Vitamins-Minerals (MULTIVITAMINS THER. W/MINERALS) TABS Take 1 tablet by  mouth daily.      . ranitidine (ZANTAC) 150 MG tablet Take 150 mg by mouth daily.      . [DISCONTINUED] ranitidine (ZANTAC) 150 MG tablet Take 1 tablet (150 mg total) by mouth 2 (two) times daily.  60 tablet  11   Review Of Systems: Per HPI , Otherwise 12 point review of systems was performed and was unremarkable.  Physical Exam: BP 151/81  Pulse 72  Temp(Src) 98.5 F (36.9 C) (Oral)  Resp 18  Ht 6\' 3"  (1.905 m)  Wt 204 lb 14.4 oz (92.942 kg)  BMI 25.61 kg/m2  SpO2 94% Exam: Gen: NAD, alert, cooperative with exam HEENT: NCAT, EOMI, MMM CV: RRR, good S1/S2, no murmur Resp: CTABL, no wheezes, non-labored Abd: Soft, tender to palpation throughout, + BS, no  rebound, exam limited due to tenderness On repeat exam at 7:00 PM-mild-moderate tenderness Epigastric pain with mild throughout rest of abdomen, no rebound or guarding Ext: No edema Neuro: Alert and oriented, No gross deficits   Labs and Imaging: CBC BMET   Recent Labs Lab 10/31/12 1109  WBC 11.4*  HGB 16.8  HCT 48.3  PLT 224    Recent Labs Lab 10/31/12 1109  NA 138  K 4.1  CL 103  CO2 24  BUN 12  CREATININE 0.76  GLUCOSE 147*  CALCIUM 9.4      Recent Labs Lab 10/31/12 1109  AST 99*  ALT 84*  ALKPHOS 121*  BILITOT 0.9  PROT 8.3  ALBUMIN 3.8   Lipase 1206 Amylase 670  Assessment and Plan: [name] is a 62 y.o. year old male presenting with abdominal pain after an ethanol binge most consistent with acute pancreatitis.  We did consider PUD with chronic NSAID use and acute cystitis but the amount of NSAIDs he usees seems unlikely to cause PUD and his history of binge drinking and acute onst abd pain the following day is very characteristic of acute pancreatitis.   Acute Pancreatitis - hemodynamically stable, Need LDH to calculate Ranson criteria but only score's 1 giving a 1% risk of predicted mortality on this admission provided teh LDH is less than 350. LDH now back and not elevated. - Lipase 1206, amylase 670, not planning on trending - AST 99, ALT 84 - Alkaline phosphatase elevated to 121 but this appears to be baseline for him - Will keep him NPO for the night and consider advancing as we control his pain improves - Some relief with 1 mg dilaudid in the ed, Will add PRN  Percocet 10/325 q 4 hours and add 0.5 mg dilaudid IV for breakthrough pain.   COPD - No dyspnea today and does not routinely use albuterol - will monitor and use albuterol if needed  DM2 - Last A1C 7.0 - Patient trying aggressive diet control currently. Discussed soup only is not the best diet. Advised DASH diet type regimen with increased fruits/vegetables.  - Will monitor Am  BMP - consider aspirin on outpatient basis to reduce CV risk. Can also consider statin if LFTs normalize again (LDL 113)  HTN - Not one of his chronic problems, elevated tonight possibly 2/2 pain.  - Will monitor and treat vs give outpatient recommendations depending on how his pressures are as we control his pain.  -improved with pain control  FEN/GI: NPO, D5 NS at 125 mL/hr. May need to change away from D5 if sugars elevated.  Prophylaxis: Sub q heparin Disposition: med surg, home when tolerating fluids and pain controlled on  oral meds Code Status: Full  [name], MD 10/31/2012, 6:39 PM  Family Medicine Upper Level Addendum:   I have seen and examined the patient independently, discussed with Dr. Wendi Snipes, fully reviewed the H+P and agree with it's contents with the additions as noted in blue text. My independent exam is below.    Brayton Mars. Melanee Spry, MD, PGY2 10/31/2012 8:55 PM

## 2012-10-31 NOTE — ED Provider Notes (Signed)
History     CSN: [id number]  Arrival date & time 10/31/12  59   First MD Initiated Contact with Patient 10/31/12 1210      Chief Complaint  Patient presents with  . Abdominal Pain    (Consider location/radiation/quality/duration/timing/severity/associated sxs/prior treatment) HPI Patient present to the emergency department with mid abdominal pain, that began 2 days, ago.  Patient, states, that he has a history of pancreatitis.  Patient, states, that Thursday, Friday and Saturday of this past week.  He had been drinking large amounts of alcohol.  Patient, states, that he does not have any nausea, vomiting, diarrhea, weakness, chest pain, shortness of breath, dysuria, back pain, dizziness, or syncope.  Patient, states, that he did not take anything prior to arrival for his symptoms.  Past Medical History  Diagnosis Date  . Pancreas disorder   . Diabetes mellitus   . Hyperlipidemia   . Shortness of breath     with ambulation  . Anginal pain   . Pneumonia   . GERD (gastroesophageal reflux disease)   . Headache   . Arthritis     Past Surgical History  Procedure Laterality Date  . Back surgery    . Neck surgery    . Splenectomy      Family History  Problem Relation Age of Onset  . Diabetes Mother   . Aneurysm Mother   . Alcohol abuse Father     History  Substance Use Topics  . Smoking status: Current Every Day Smoker -- 1.50 packs/day for 45 years    Types: Cigarettes    Start date: 04/09/1964  . Smokeless tobacco: Never Used     Comment: trying to quit. smoked 3 ppd before  . Alcohol Use: 0.0 oz/week    6-7 Cans of beer per week     Comment: quit  05/28/11      Review of Systems All other systems negative except as documented in the HPI. All pertinent positives and negatives as reviewed in the HPI. Allergies  Morphine and related and Penicillins  Home Medications   Current Outpatient Rx  Name  Route  Sig  Dispense  Refill  . ibuprofen (ADVIL,MOTRIN) 200  MG tablet   Oral   Take 400 mg by mouth 2 (two) times daily as needed for pain.         . Multiple Vitamins-Minerals (MULTIVITAMINS THER. W/MINERALS) TABS   Oral   Take 1 tablet by mouth daily.         . ranitidine (ZANTAC) 150 MG tablet   Oral   Take 150 mg by mouth daily.         Marland Kitchen thiamine (VITAMIN B-1) 100 MG tablet   Oral   Take 100 mg by mouth daily.           BP 131/61  Pulse 70  Temp(Src) 97.6 F (36.4 C) (Oral)  Resp 23  SpO2 97%  Physical Exam  Constitutional: He appears well-developed and well-nourished. No distress.  HENT:  Head: Normocephalic and atraumatic.  Eyes: Pupils are equal, round, and reactive to light.  Cardiovascular: Normal rate, regular rhythm and normal heart sounds.  Exam reveals no gallop and no friction rub.   No murmur heard. Pulmonary/Chest: Effort normal and breath sounds normal.  Abdominal: Soft. Normal appearance and bowel sounds are normal. There is tenderness in the epigastric area. There is no rigidity, no rebound, no guarding and no CVA tenderness. No hernia.      ED Course  Procedures (including critical care time)  Labs Reviewed  COMPREHENSIVE METABOLIC PANEL - Abnormal; Notable for the following:    Glucose, Bld 147 (*)    AST 99 (*)    ALT 84 (*)    Alkaline Phosphatase 121 (*)    All other components within normal limits  CBC WITH DIFFERENTIAL - Abnormal; Notable for the following:    WBC 11.4 (*)    Monocytes Absolute 1.2 (*)    All other components within normal limits  LIPASE, BLOOD - Abnormal; Notable for the following:    Lipase 1206 (*)    All other components within normal limits  AMYLASE - Abnormal; Notable for the following:    Amylase 670 (*)    All other components within normal limits    I spoke with the patient.  He says his pain is down from a 10 out of 10 to a 3/10 patient admitted to the hospital for continued abdominal pain, with pancreatitis.  I spoke with the family practice resident,  who will be down to see the patient MDM  [place], PA-C 11/02/12 1714

## 2012-10-31 NOTE — ED Notes (Signed)
Per EMS: [place] c/o generalized abd pain; pt sts hx of pancreatitis and feels same

## 2012-11-01 DIAGNOSIS — K859 Acute pancreatitis without necrosis or infection, unspecified: Principal | ICD-10-CM

## 2012-11-01 DIAGNOSIS — K861 Other chronic pancreatitis: Secondary | ICD-10-CM

## 2012-11-01 DIAGNOSIS — R109 Unspecified abdominal pain: Secondary | ICD-10-CM

## 2012-11-01 DIAGNOSIS — K219 Gastro-esophageal reflux disease without esophagitis: Secondary | ICD-10-CM

## 2012-11-01 DIAGNOSIS — F1011 Alcohol abuse, in remission: Secondary | ICD-10-CM

## 2012-11-01 DIAGNOSIS — J449 Chronic obstructive pulmonary disease, unspecified: Secondary | ICD-10-CM

## 2012-11-01 DIAGNOSIS — E785 Hyperlipidemia, unspecified: Secondary | ICD-10-CM

## 2012-11-01 LAB — COMPREHENSIVE METABOLIC PANEL
ALT: 47 U/L (ref 0–53)
AST: 36 U/L (ref 0–37)
Albumin: 3.3 g/dL — ABNORMAL LOW (ref 3.5–5.2)
Alkaline Phosphatase: 94 U/L (ref 39–117)
BUN: 8 mg/dL (ref 6–23)
CO2: 27 mEq/L (ref 19–32)
Calcium: 9 mg/dL (ref 8.4–10.5)
Chloride: 103 mEq/L (ref 96–112)
Creatinine, Ser: 0.78 mg/dL (ref 0.50–1.35)
GFR calc Af Amer: >90 mL/min (ref >90)
GFR calc non Af Amer: >90 mL/min (ref >90)
Glucose, Bld: 156 mg/dL — ABNORMAL HIGH (ref 70–99)
Potassium: 4.3 mEq/L (ref 3.5–5.1)
Sodium: 137 mEq/L (ref 135–145)
Total Bilirubin: 0.6 mg/dL (ref 0.3–1.2)
Total Protein: 7.4 g/dL (ref 6.0–8.3)

## 2012-11-01 LAB — CBC
HCT: 44.7 % (ref 39.0–52.0)
Hemoglobin: 15.4 g/dL (ref 13.0–17.0)
MCH: 31 pg (ref 26.0–34.0)
MCHC: 34.5 g/dL (ref 30.0–36.0)
MCV: 90.1 fL (ref 78.0–100.0)
Platelets: 198 10*3/uL (ref 150–400)
RBC: 4.96 MIL/uL (ref 4.22–5.81)
RDW: 14.9 % (ref 11.5–15.5)
WBC: 12.6 10*3/uL — ABNORMAL HIGH (ref 4.0–10.5)

## 2012-11-01 MED ORDER — PANTOPRAZOLE SODIUM 40 MG IV SOLR
40.0000 mg | INTRAVENOUS | Status: DC
  Administered 2012-11-01 – 2012-11-03 (×3): 40 mg via INTRAVENOUS
  Filled 2012-11-01 (×3): qty 40

## 2012-11-01 MED ORDER — OXYCODONE HCL 5 MG PO TABS
10.0000 mg | ORAL_TABLET | ORAL | Status: DC
  Administered 2012-11-01 – 2012-11-03 (×12): 10 mg via ORAL
  Filled 2012-11-01 (×12): qty 2

## 2012-11-01 NOTE — Progress Notes (Signed)
Discussed in rounds.  Seen today by attending Dr. Mingo Amber.  Agree with Dr. Alen Bleacher documentation and management.

## 2012-11-01 NOTE — Care Management Note (Unsigned)
    Page 1 of 1   11/01/2012     10:15:57 AM   CARE MANAGEMENT NOTE 11/01/2012  Patient:  Timothy Martinez, Timothy Martinez   Account Number:  [id number]  Date Initiated:  11/01/2012  Documentation initiated by:  Tomi Bamberger  Subjective/Objective Assessment:   dx pancreatitis  admit- lives alone,     Action/Plan:   Anticipated DC Date:  11/04/2012   Anticipated DC Plan:  [place]  CM consult      Choice offered to / List presented to:             Status of service:  In process, will continue to follow Medicare Important Message given?   (If response is "NO", the following Medicare IM given date fields will be blank) Date Medicare IM given:   Date Additional Medicare IM given:    Discharge Disposition:    Per UR Regulation:  Reviewed for med. necessity/level of care/duration of stay  If discussed at [place] of Stay Meetings, dates discussed:    Comments:  11/01/12 10:14 Tomi Bamberger RN, BSN [phone] patient lives alone, patient with pancreatitis, NCM will continue to follow for dc needs.

## 2012-11-01 NOTE — H&P (Addendum)
FMTS Attending Admission Note: Annabell Sabal MD Personal pager:  [phone] FPTS Service Pager:  [phone]  I  have seen and examined this patient, reviewed their chart. I have discussed this patient with the resident. I agree with the resident's findings, assessment and care plan.  Additionally: 62 yo M admitted with 3 day history of abdominal pain after staring alcohol binge and found to have pancreatitis with lipase >1200 as well as transaminitis.  Admitted for pain control and observation.  This AM still with abdominal pain, more LUQ and epigastric in nature, was diffuse yesterday.  Admits openly "I brought this on myself."  Mouth with dry oral mucosa, RRR, Lungs clear.  Abdomen protuberant and with previous surgical scar noted, TTP epigastrum, LUQ, and minimally LLQ.  No guarding or rebound.  Bowel sounds present.  No flank tenderness or bruising.  Denies any red flag symptoms.  No N/V/D.  No melena.  Imp/Plan: 1.  Pancreatitis, EtOH-induced: - Most likely etiology of abdominal pain, especially with elevated amylase and lipase. Mild improvement this AM.  - resident note mentions appendicitis -- after speaking with resident, this was a typographical error - Agree with NPO status.  Agree with pain medications.  Agree with fluid resuscitation - Follow pain status and trend labs (calcium, etc) to ensure no worsening.  LFTs have normalized.  Agree no need to trend lipase as long as demonstrates improvement.   - Low threshold for imaging abdomen if no improvement.    2.  Hx/o GERD: - on Famotidine.  - Has LUQ pain.  Will start PPI for likely worsening.  No evidence of PUD but gastric protection likely helpful.  - Can decrease or discontinue on outpatient basis.

## 2012-11-01 NOTE — Progress Notes (Signed)
Family Medicine Teaching Service Daily Progress Note Service Page: [phone]  Subjective:  Patient states he did not sleep well overnight because of pain. He states the meds work "really well" taking his pain from a 10 to 8/10. Mostly L upper and lower quadrant pain now radiating some to his back. Worsened by movement or palpation.   Objective: Temp:  [97.6 F (36.4 C)-98.5 F (36.9 C)] 98.5 F (36.9 C) (04/10 0538) Pulse Rate:  [56-153] 61 (04/10 0538) Resp:  [16-25] 18 (04/10 0538) BP: (115-151)/(44-105) 127/70 mmHg (04/10 0538) SpO2:  [92 %-97 %] 95 % (04/10 0538) Weight:  [204 lb 14.4 oz (92.942 kg)] 204 lb 14.4 oz (92.942 kg) (04/09 1830) Exam: Gen: NAD, alert, cooperative with exam  HEENT: NCAT, MMM CV: RRR, good S1/S2, no murmur  Resp: CTABL, no wheezes, non-labored  Abd: Soft, tender to palpation throughout, + BS, no rebound, exam limited due to tenderness  Ext: No edema  Neuro: Alert and oriented, No gross deficits   I have reviewed the patient's medications, labs, imaging, and diagnostic testing.  Notable results are summarized below.  CBC BMET   Recent Labs Lab 10/31/12 1109 10/31/12 1947  WBC 11.4* 13.0*  HGB 16.8 15.5  HCT 48.3 45.7  PLT 224 204    Recent Labs Lab 10/31/12 1109  NA 138  K 4.1  CL 103  CO2 24  BUN 12  CREATININE 0.76  GLUCOSE 147*  CALCIUM 9.4       Plan: Timothy Martinez is a 62 y.o. year old male presenting with abdominal pain after an ethanol binge most consistent with acute pancreatitis. We did consider PUD with chronic NSAID use and acute cystitis but the amount of NSAIDs he usees seems unlikely to cause PUD and his history of binge drinking and acute onst abd pain the following day is very characteristic of acute appendicitis   Acute Pancreatitis  - hemodynamically stable, ranson score of 1 - Lipase 1206, amylase 670 on admission - AST 99-->36, ALT 84-->47, Alk phos 121-->94 (baseline) - on review of meds he has gotten PRN  dilaudid X3 overnight, he states he is fine as long as he gets it every two hours.   - Encourage PO pain meds for basal control, consider scheduling  - Continue current dilaudid - Consider advancing diet this afternoon vs tomorrow morning  COPD  - No dyspnea today and does not routinely use albuterol  - will monitor and use albuterol if needed   DM2  - Last A1C 7.0, Blood glucose this am 156 but note that he is on D5 NS for fluids - Patient trying aggressive diet control currently - Will monitor Am BMP  - consider aspirin on outpatient basis to reduce CV risk. Can also consider statin with normalizing LFTs (LDL 113)   High blood pressure without diagnosis of HTN - improving - Not one of his chronic problems, elevated on admission possibly 2/2 pain.  - Will monitor and treat vs give outpatient recommendations depending on how his pressures are as we control his pain.  - improved with pain control   FEN/GI: NPO, D5 NS at 125 mL/hr.  Prophylaxis: Sub q heparin  Disposition: med surg, home when tolerating fluids and pain controlled on oral meds  Code Status: Full    [name], MD 11/01/2012, 7:24 AM

## 2012-11-02 DIAGNOSIS — E1110 Type 2 diabetes mellitus with ketoacidosis without coma: Secondary | ICD-10-CM

## 2012-11-02 LAB — CBC
HCT: 44.2 % (ref 39.0–52.0)
Hemoglobin: 15.4 g/dL (ref 13.0–17.0)
MCH: 31.6 pg (ref 26.0–34.0)
MCHC: 34.8 g/dL (ref 30.0–36.0)
MCV: 90.8 fL (ref 78.0–100.0)
Platelets: 190 10*3/uL (ref 150–400)
RBC: 4.87 MIL/uL (ref 4.22–5.81)
RDW: 14.6 % (ref 11.5–15.5)
WBC: 10.3 10*3/uL (ref 4.0–10.5)

## 2012-11-02 LAB — BASIC METABOLIC PANEL
BUN: 7 mg/dL (ref 6–23)
CO2: 28 mEq/L (ref 19–32)
Calcium: 9 mg/dL (ref 8.4–10.5)
Chloride: 102 mEq/L (ref 96–112)
Creatinine, Ser: 0.84 mg/dL (ref 0.50–1.35)
GFR calc Af Amer: >90 mL/min (ref >90)
GFR calc non Af Amer: >90 mL/min (ref >90)
Glucose, Bld: 136 mg/dL — ABNORMAL HIGH (ref 70–99)
Potassium: 4 mEq/L (ref 3.5–5.1)
Sodium: 137 mEq/L (ref 135–145)

## 2012-11-02 MED ORDER — ALBUTEROL SULFATE HFA 108 (90 BASE) MCG/ACT IN AERS
2.0000 | INHALATION_SPRAY | RESPIRATORY_TRACT | Status: DC | PRN
  Filled 2012-11-02: qty 6.7

## 2012-11-02 NOTE — Progress Notes (Signed)
Seen and examined.  Feeling better.  [place].  Pain control OK.  Passing gas.  Committed to stop EtOH.  Advance diet.

## 2012-11-02 NOTE — Progress Notes (Signed)
[facility] Discharge Summary  Patient name: Timothy Martinez Medical record number: [id number] Date of birth: Apr 27, 1951 Age: 62 y.o. Gender: male Date of Admission: 10/31/2012  Date of Discharge: 11/03/2012 Admitting Physician: Zigmund Gottron, MD  Primary Care Provider: [name], MD  Indication for Hospitalization: Acute pancreatitis Discharge Diagnoses:  1. Acute on Chronic Alcoholic Pancreatitis 2. COPD 3. DM2 4. Tobacco Abuse  Consultations: None  Significant Labs and Imaging:   Recent Labs Lab 10/31/12 1947 11/01/12 0555 11/02/12 0854  HGB 15.5 15.4 15.4  HCT 45.7 44.7 44.2  WBC 13.0* 12.6* 10.3  PLT 204 198 190    Recent Labs Lab 10/31/12 1109 11/01/12 0555 11/02/12 0854  NA 138 137 137  K 4.1 4.3 4.0  CL 103 103 102  CO2 24 27 28   GLUCOSE 147* 156* 136*  BUN 12 8 7   CREATININE 0.76 0.78 0.84  CALCIUM 9.4 9.0 9.0    Recent Labs Lab 10/31/12 1109 11/01/12 0555  AST 99* 36  ALT 84* 47  ALKPHOS 121* 94  BILITOT 0.9 0.6  PROT 8.3 7.4  ALBUMIN 3.8 3.3*  Lipase 1206 Amylase 670  Procedures: None  Brief Hospital Course:  LAWSON HOGREFE is a 62 y.o. year old male presenting with abdominal pain after an ethanol binge most consistent with acute pancreatitis.  Acute Pancreatitis  He presented with severe abdominal pain after a drinking binge with ana elevated lipase and amylase. He was admitted, made in NPO, given MIVF, and started on PO and IV pain meds. The following day his diet was advanced to clear liquids which he began to tolerate well 1 day later. His ranson score was 1, which gives a very low likelihood of mortality, and his WBC trended down from 13.0 to 10.3 the day before dc. He was discharged when he was stable on oral pain meds and tolerating fluids and soft solid PO well.   COPD  He had some increased cough while he was inpatienta nd given an albuterol inhaler for that. He denied dypsnea throughout  the admission.   DM2  His last A1C was 7.0 and he was in the midst of aggressive diet control prior to this episode. Will defer treatement to his PCP.   High blood pressure without diagnosis of HTN - Elevated on presentation to the ED. It was likely pain related and resolved as his pain was controlled. No medications were required  Dispo: Home  Discharge Medications:    Medication List    ASK your doctor about these medications       ibuprofen 200 MG tablet  Commonly known as:  ADVIL,MOTRIN  Take 400 mg by mouth 2 (two) times daily as needed for pain.     multivitamins ther. w/minerals Tabs  Take 1 tablet by mouth daily.     ranitidine 150 MG tablet  Commonly known as:  ZANTAC  Take 150 mg by mouth daily.     thiamine 100 MG tablet  Commonly known as:  VITAMIN B-1  Take 100 mg by mouth daily.       Issues for Follow Up:  - Continue to counsel on EtOH abuse - Consider starting Metformin for DM2, last A1C was 7.0 - Monitor BP for HTN, elevated while in pain here and then resolved - Consider Starting a statin, LFTs normalized while IP.   Outstanding Results: None  Discharge Instructions: Please refer to Patient Instructions section of EMR for full details.  Patient was counseled  important signs and symptoms that should prompt return to medical care, changes in medications, dietary instructions, activity restrictions, and follow up appointments.    Discharge Condition: Stable  [name], MD 11/02/2012, 12:21 PM

## 2012-11-02 NOTE — Progress Notes (Signed)
Family Medicine Teaching Service Daily Progress Note Service Page: [phone]  Subjective:  Pain more well controlled today. 10/10 to 4/10 with oral meds, only needing occasional breakthrough. Tolerating clears this am, avoided PO yesterday   Objective: Temp:  [98.2 F (36.8 C)-98.5 F (36.9 C)] 98.2 F (36.8 C) (04/11 0520) Pulse Rate:  [62-81] 81 (04/11 0520) Resp:  [18] 18 (04/11 0520) BP: (125-140)/(70-76) 125/76 mmHg (04/11 0520) SpO2:  [94 %-97 %] 94 % (04/11 0520)  Exam: Gen: NAD, alert, cooperative with exam  HEENT: NCAT, MMM CV: RRR, good S1/S2, no murmur  Resp: CTABL, no wheezes, non-labored  Abd: Soft, tender to palpation throughout improved from prior exams, + BS, no rebound, exam limited due to tenderness  Ext: No edema  Neuro: Alert and oriented, No gross deficits   I have reviewed the patient's medications, labs, imaging, and diagnostic testing.  Notable results are summarized below.  CBC BMET   Recent Labs Lab 10/31/12 1947 11/01/12 0555 11/02/12 0854  WBC 13.0* 12.6* 10.3  HGB 15.5 15.4 15.4  HCT 45.7 44.7 44.2  PLT 204 198 190    Recent Labs Lab 10/31/12 1109 11/01/12 0555  NA 138 137  K 4.1 4.3  CL 103 103  CO2 24 27  BUN 12 8  CREATININE 0.76 0.78  GLUCOSE 147* 156*  CALCIUM 9.4 9.0       Plan: Timothy Martinez is a 62 y.o. year old male presenting with abdominal pain after an ethanol binge most consistent with acute pancreatitis.   Acute Pancreatitis  - hemodynamically stable, ranson score of 1 - Lipase 1206, amylase 670 on admission - AST 99-->36, ALT 84-->47, Alk phos 121-->94 (baseline) - WBC trending down: 13.0-->12.6-->10.3 - Pain meds:  - scheduled 10 mg oxycodone q4 hours,   - PRN dilaudid 0.5 mg X 3 in last 24 hours, 1 dose req with oral meds being scheduled  - Continue current regimen - clear liquid diet, continue IVF for now as he is just now tolerating PO  COPD  - Notes increased cough, wants albuterol - ALbuterol  Q4 PRN cough/wheezing/dyspnea - will monitor and use albuterol if needed   DM2  - Last A1C 7.0 - Patient trying aggressive diet control currently - Will monitor Am BMP  - consider aspirin on outpatient basis to reduce CV risk. Can also consider statin with normalizing LFTs (LDL 113)   High blood pressure without diagnosis of HTN - resolved - Not one of his chronic problems, elevated on admission possibly 2/2 pain.  - Will monitor and treat vs give outpatient recommendations depending on how his pressures are as we control his pain.  - improved with pain control   FEN/GI: NPO, D5 NS at 125 mL/hr.  Prophylaxis: Sub q heparin  Disposition: med surg, home when tolerating fluids and pain controlled on oral meds  Code Status: Full    [name], MD 11/02/2012, 7:29 AM

## 2012-11-03 MED ORDER — ALBUTEROL SULFATE HFA 108 (90 BASE) MCG/ACT IN AERS: 2.0000 | INHALATION_SPRAY | RESPIRATORY_TRACT | Status: DC | PRN

## 2012-11-03 MED ORDER — OXYCODONE HCL 10 MG PO TABS: 10.0000 mg | ORAL_TABLET | ORAL | Status: DC | PRN

## 2012-11-03 NOTE — Progress Notes (Signed)
Seen and examined.  Patient feels much better, tolerating PO and pain controled.  Agree with Dr. Zigmund Daniel that DC today is indicated.

## 2012-11-03 NOTE — Progress Notes (Signed)
Pt given discharge instructions.  Pt verbalized understanding and denied any needs.  IV removed.  Pt taken via wheel chair to pt drop off.

## 2012-11-03 NOTE — Progress Notes (Signed)
Family Medicine Teaching Service Daily Progress Note Service Page: [phone]  Subjective:  Pain more well controlled today. 10/10 to 4/10 with oral meds. Tolerating soft solids today, ready to go home.  Objective: Temp:  [97.5 F (36.4 C)-98.2 F (36.8 C)] 97.5 F (36.4 C) (04/12 0500) Pulse Rate:  [59-81] 59 (04/12 0500) Resp:  [18] 18 (04/12 0500) BP: (123-143)/(75-79) 123/75 mmHg (04/12 0500) SpO2:  [92 %-94 %] 92 % (04/12 0500)  Exam: Gen: NAD, alert, cooperative with exam  HEENT: NCAT, MMM CV: RRR, good S1/S2, no murmur  Resp: CTABL, no wheezes, non-labored  Abd: Soft, tender to palpation throughout improved from prior exams, + BS, no rebound, exam limited due to tenderness  Ext: No edema  Neuro: Alert and oriented, No gross deficits   I have reviewed the patient's medications, labs, imaging, and diagnostic testing.  Notable results are summarized below.  CBC BMET   Recent Labs Lab 10/31/12 1947 11/01/12 0555 11/02/12 0854  WBC 13.0* 12.6* 10.3  HGB 15.5 15.4 15.4  HCT 45.7 44.7 44.2  PLT 204 198 190    Recent Labs Lab 10/31/12 1109 11/01/12 0555 11/02/12 0854  NA 138 137 137  K 4.1 4.3 4.0  CL 103 103 102  CO2 24 27 28   BUN 12 8 7   CREATININE 0.76 0.78 0.84  GLUCOSE 147* 156* 136*  CALCIUM 9.4 9.0 9.0       Plan: CARLESTER DAYS is a 62 y.o. year old male presenting with abdominal pain after an ethanol binge most consistent with acute pancreatitis.   Acute Pancreatitis  - hemodynamically stable, ranson score of 1 - Lipase 1206, amylase 670 on admission - AST 99-->36, ALT 84-->47, Alk phos 121-->94 (baseline) - WBC trending down: 13.0-->12.6-->10.3 - Pain meds:  - scheduled 10 mg oxycodone q4 hours,   - Continue current regimen - Tolerating soft solids, pain well controlled with PO pain medication.    COPD  - Notes increased cough, wants albuterol - ALbuterol Q4 PRN cough/wheezing/dyspnea - will monitor and use albuterol if needed   DM2   - Last A1C 7.0 - Patient trying aggressive diet control currently - Will monitor Am BMP  - consider aspirin on outpatient basis to reduce CV risk. Can also consider statin with normalizing LFTs (LDL 113)   High blood pressure without diagnosis of HTN - resolved - Not one of his chronic problems, elevated on admission possibly 2/2 pain.  - Will monitor and treat vs give outpatient recommendations depending on how his pressures are as we control his pain.  - improved with pain control   FEN/GI: Tolerating PO well, soft solids/full liquid Prophylaxis: Sub q heparin  Disposition: Today, since tolerating PO well and pain well controlled on PO pain medication.  Code Status: Full    Aranza Geddes, DO 11/03/2012, 8:39 AM

## 2012-11-03 NOTE — Progress Notes (Signed)
Seen and examined.  Agree with Dr. Zigmund Daniel re: DC and the management and documentation.

## 2012-11-09 NOTE — ED Provider Notes (Signed)
Medical screening examination/treatment/procedure(s) were performed by non-physician practitioner and as supervising physician I was immediately available for consultation/collaboration.    Johnna Acosta, MD 11/09/12 1044

## 2012-11-12 ENCOUNTER — Encounter: Payer: Self-pay | Admitting: *Deleted

## 2012-11-12 ENCOUNTER — Encounter: Payer: Self-pay | Admitting: Family Medicine

## 2012-11-13 ENCOUNTER — Encounter: Payer: Self-pay | Admitting: Family Medicine

## 2012-11-13 ENCOUNTER — Ambulatory Visit (INDEPENDENT_AMBULATORY_CARE_PROVIDER_SITE_OTHER): Payer: Medicare Other | Admitting: Family Medicine

## 2012-11-13 VITALS — BP 118/73 | HR 76 | Ht 75.0 in | Wt 202.0 lb

## 2012-11-13 DIAGNOSIS — F17200 Nicotine dependence, unspecified, uncomplicated: Secondary | ICD-10-CM

## 2012-11-13 DIAGNOSIS — Z720 Tobacco use: Secondary | ICD-10-CM

## 2012-11-13 DIAGNOSIS — K861 Other chronic pancreatitis: Secondary | ICD-10-CM

## 2012-11-13 DIAGNOSIS — F1010 Alcohol abuse, uncomplicated: Secondary | ICD-10-CM

## 2012-11-13 DIAGNOSIS — E119 Type 2 diabetes mellitus without complications: Secondary | ICD-10-CM

## 2012-11-13 DIAGNOSIS — J449 Chronic obstructive pulmonary disease, unspecified: Secondary | ICD-10-CM

## 2012-11-13 DIAGNOSIS — K860 Alcohol-induced chronic pancreatitis: Secondary | ICD-10-CM

## 2012-11-13 DIAGNOSIS — E1110 Type 2 diabetes mellitus with ketoacidosis without coma: Secondary | ICD-10-CM

## 2012-11-13 MED ORDER — OXYCODONE HCL 10 MG PO TABS: 10.0000 mg | ORAL_TABLET | Freq: Two times a day (BID) | ORAL | Status: DC | PRN

## 2012-11-13 NOTE — Patient Instructions (Addendum)
Hospital Follow up.   1. Pancreatitis-I am glad your pain is improving. I have refilled your oxycodone but you should continue to come down off of it over the next 1-2 weeks. Try 1 pill in a few days instead of 2 then come off of it completely by 2 weeks if able. Keep advancing your diet slowly.  2. Alcoholism-Great job not drinking! I think the AA meeting at [facility] would be great for you.  3. COPD/Smoking-I am here to help when you are ready to quit. It will be great for your health to quit. Let me know if you every have to increase the amount of albuterol you are using.   Please see me in the next 2-3 weeks so we can discuss your diabetes and other medical issues. You did get your diabetic eye exam today.  Dr. Yong Channel

## 2012-11-13 NOTE — Assessment & Plan Note (Signed)
Patient free from alcohol use since leaving hospital. Advised need for AA meetings to help patient with sustained abstinence. Patient willing to give this a try. Will follow up at next visit.

## 2012-11-13 NOTE — Discharge Summary (Signed)
[facility] Discharge Summary  Patient name: Timothy Martinez Medical record number: [id number] Date of birth: 1950/09/27 Age: 62 y.o. Gender: male Date of Admission: 10/31/2012  Date of Discharge: 11/03/2012 Admitting Physician: Zigmund Gottron, MD  Primary Care Provider: [name], MD  Indication for Hospitalization: Acute on chronic alcoholic pancreatitis Discharge Diagnoses:  Acute on chronic alcoholic pancreatitis DM2 COPD  Consultations: None  Significant Labs and Imaging:     10/31/2012 11:09 11/01/2012 05:55 11/02/2012 08:54  Glucose 147 (H) 156 (H) 136 (H)     10/31/2012 11:09 11/01/2012 05:55 11/02/2012 08:54  Sodium 138 137 137  Potassium 4.1 4.3 4.0  Chloride 103 103 102  CO2 24 27 28   BUN 12 8 7   Creatinine 0.76 0.78 0.84  Calcium 9.4 9.0 9.0  GFR calc non Af Amer >90 >90 >90  GFR calc Af Amer >90 >90 >90  Glucose 147 (H) 156 (H) 136 (H)  Alkaline Phosphatase 121 (H) 94   Albumin 3.8 3.3 (L)   Amylase 670 (H)    Lipase 1206 (H)    AST 99 (H) 36   ALT 84 (H) 47   Total Protein 8.3 7.4   Total Bilirubin 0.9 0.6    Procedures: None  Brief Hospital Course:  Timothy Martinez is a 62 y.o. year old male presenting with abdominal pain after an ethanol binge most consistent with acute pancreatitis.   Acute Pancreatitis  He was admitted to0 our service, made NPO, and given percocet and dilaudid to control his pain. The following day his diet was advanced to clears which he slowly began to tolerate. His labs improved, his pain was managed with PO meds, and he began tolerating soft foods PO. His pain was controlled on 10 mg percocet q4 hours. Details of his labs are listed below - ranson score of 1 - Lipase 1206, amylase 670 on admission  - AST 99-->36, ALT 84-->47, Alk phos 121-->94 (baseline)  - WBC trending down: 13.0-->12.6-->10.3   COPD  He did note increZAsed cougha nd PRN albuterol was provided which provided good relief.    DM2  Diet controlled OP. His blood glucoses were reasonable while IP and he did not require insulin.  High blood pressure without diagnosis of HTN  Resolved, elevation due to pain and no meds were required.   Dispo: Home   Discharge Medications:    Medication List    STOP taking these medications       ibuprofen 200 MG tablet  Commonly known as:  ADVIL,MOTRIN      TAKE these medications       albuterol 108 (90 BASE) MCG/ACT inhaler  Commonly known as:  PROVENTIL HFA;VENTOLIN HFA  Inhale 2 puffs into the lungs every 4 (four) hours as needed for wheezing or shortness of breath (and cough).     multivitamins ther. w/minerals Tabs  Take 1 tablet by mouth daily.     ranitidine 150 MG tablet  Commonly known as:  ZANTAC  Take 150 mg by mouth daily.     thiamine 100 MG tablet  Commonly known as:  VITAMIN B-1  Take 100 mg by mouth daily.       Issues for Follow Up:  - Monitor BP, not overtly hypertensive during admission - Consider daily aspirin and statin as these may be beneficial to him to prevent CVD with smoking status  Outstanding Results: None  Discharge Instructions: Please refer to Patient Instructions section of EMR for full details.  Patient was counseled important signs and symptoms that should prompt return to medical care, changes in medications, dietary instructions, activity restrictions, and follow up appointments.       Follow-up Information   Schedule an appointment as soon as possible for a visit with [name], MD. (Call for an appointment on Monday morning)    Contact information:   [address] [place] [address] [phone]       Discharge Condition: Margret Chance, MD 11/13/2012, 1:58 PM

## 2012-11-13 NOTE — Assessment & Plan Note (Signed)
Patient using albuterol 3x a week. Advised patient if this persists we may need to start him on a controller medication as well. Smoking cessation is the most key element and this was emphasized.

## 2012-11-13 NOTE — Assessment & Plan Note (Signed)
1 PPD. Not ready to quit. Advised need for cessation as this would help with pancreatitis.

## 2012-11-13 NOTE — Assessment & Plan Note (Signed)
Refilled pain medication with #20 oxycodone and patient to continue to wean. Having patient follow up within the month for diabetes and do not plan to give further refills at that time. Alcohol cessation need emphasized (see alcohol abuse).

## 2012-11-13 NOTE — Progress Notes (Addendum)
Subjective:  Hospital follow up for pancreatitis related to alcohol binge 1. Pancreatitis-pain resolving but persists. Down from 6 oxycodone per day to 2 per day. Epigastric pain persists at very mild level throughout day on this regimen. Able to eat still mainly liequids (soup, jello, etc). States solid foods seem to cause slightly worse pain. No nausea/vomiting/fever/worsened pain. 2. Alcohol abuse-patient alcohol free since leaving hospital. States he is considering an [place] group at [employer]. Friends are helping by not drinking around him as they normally do.  3. Tobacco abuse/COPD-smoking 1 PPD. Not ready to quit. Uses albuterol up to 3x a week to prevent coughing spells before he goes to bed. No dyspnea on exertion. No chest pain.    ROS--See HPI  Past Medical History Patient Active Problem List  Diagnosis  . Diabetes Mellitus Type II  . Chronic alcoholic pancreatitis  . Hyperlipidemia  . Tobacco abuse  . History of alcohol abuse  . GERD (gastroesophageal reflux disease)  . Overweight (BMI 25.0-29.9)  . Dyspnea  . COPD (chronic obstructive pulmonary disease)  . Polyuria  . Chest pain  . Abdominal pain, unspecified site   Reviewed problem list.  Medications- reviewed and updated Chief complaint-noted  Objective: BP 118/73  Pulse 76  Ht 6\' 3"  (1.905 m)  Wt 202 lb (91.627 kg)  BMI 25.25 kg/m2 Gen: NAD, resting comfortably CV: RRR no murmurs rubs or gallops Lungs: CTAB no crackles, wheeze, rhonchi Abd: mild tenderness in epigastric region. No rebound/guarding. Normal bowel sounds. Soft.  Skin: warm, dry Neuro: grossly normal, moves all extremities Ext: no edema  Assessment/Plan:  Patient to return in 2-3 weeks to discuss DM and related healthcare maintenance needs (pneumovax, foot exam, urine microalbumin) Retinal exam completed in office but per Lamont Dowdy needs repeat attempted at next visit as one eye was incomplete.  Also likely needs to be started  on a statin and aspirin due to age/diabetes at that time.

## 2012-11-14 NOTE — Discharge Summary (Signed)
Seen and examined on the day of discharge.  Agree with Dr. Ansel Bong documentation and management.

## 2012-11-27 NOTE — Addendum Note (Signed)
Addended byAndreas Newport on: 11/27/2012 10:59 AM   Modules accepted: Orders

## 2012-12-21 ENCOUNTER — Encounter: Payer: Self-pay | Admitting: Family Medicine

## 2012-12-21 ENCOUNTER — Ambulatory Visit (INDEPENDENT_AMBULATORY_CARE_PROVIDER_SITE_OTHER): Payer: Medicare Other | Admitting: Family Medicine

## 2012-12-21 VITALS — BP 124/79 | HR 86 | Temp 97.8°F | Ht 75.0 in | Wt 202.4 lb

## 2012-12-21 DIAGNOSIS — M545 Low back pain, unspecified: Secondary | ICD-10-CM

## 2012-12-21 DIAGNOSIS — F1010 Alcohol abuse, uncomplicated: Secondary | ICD-10-CM

## 2012-12-21 DIAGNOSIS — E785 Hyperlipidemia, unspecified: Secondary | ICD-10-CM

## 2012-12-21 DIAGNOSIS — G8929 Other chronic pain: Secondary | ICD-10-CM | POA: Insufficient documentation

## 2012-12-21 DIAGNOSIS — Z23 Encounter for immunization: Secondary | ICD-10-CM

## 2012-12-21 DIAGNOSIS — E1110 Type 2 diabetes mellitus with ketoacidosis without coma: Secondary | ICD-10-CM

## 2012-12-21 DIAGNOSIS — J449 Chronic obstructive pulmonary disease, unspecified: Secondary | ICD-10-CM

## 2012-12-21 DIAGNOSIS — J4489 Other specified chronic obstructive pulmonary disease: Secondary | ICD-10-CM

## 2012-12-21 HISTORY — DX: Other chronic pain: G89.29

## 2012-12-21 HISTORY — DX: Low back pain, unspecified: M54.50

## 2012-12-21 LAB — POCT GLYCOSYLATED HEMOGLOBIN (HGB A1C): Hemoglobin A1C: 6.8

## 2012-12-21 MED ORDER — TETANUS-DIPHTH-ACELL PERTUSSIS 5-2.5-18.5 LF-MCG/0.5 IM SUSP: 0.5000 mL | Freq: Once | INTRAMUSCULAR | Status: DC

## 2012-12-21 MED ORDER — TRAMADOL HCL 50 MG PO TABS: 50.0000 mg | ORAL_TABLET | Freq: Every day | ORAL | Status: DC | PRN

## 2012-12-21 NOTE — Progress Notes (Signed)
Subjective:   #Low back pain History of surgery on low back within last 10 years. Cannot remember physicians name in order to allow [place] to get records. Patient states about twice a week he will get flares of his low back pain. Located in paraspinous area with some radiation to buttocks and posterior thighs. Most typically occurs when in seated position for prolonged period (most notably 10/10 pain after he rides lawnmower for 3 hours which then goes to 5/10 next morning without medication. Also hurts if stays in recliner for prolonged period.  History of elevated LFTs which have come back down after pancreatitis resolves. Not able to take tylenol. Patient thinks he was told not to take ibuprofen when he was in the hospital and I do see a stop medication when he left the hospital though it is not clear why ibuprofen not indicated.   ROS-History negative for trauma, history of cancer, fever, chills, weight loss, recent bacterial infection, recent IV drug use, HIV, pain worse at night or while supine, saddle anesthesia, bladder issues,  focal neurological deficits including weakness, no numbness or tingling  In feet either.   # COPD patient states he understands how to use albuterol better now (for symptoms not for prevention). He states usually about once a week before bed he needs to use his inhaler but is without SOB or DOE throughout the rest of the week. Continues to smoke 1ppd and not ready to quit. No chest pain or fever.   # Alcohol abuse Abstinent now since hospitalization. started AA at [facility]. Friends not drinking around him. No recurrence of abdominal pain from pancreatitis upper abdominal pain. No further chronic abdominal pain. Out of oxycodone.   # DM Not currently on medication and does not check blood sugars. a1c 6.8 today. No polyuria/polydipsia/blurry vision.   Health Maintenance Due  Topic Date Due  . Pneumococcal Polysaccharide Vaccine (#1)-done today 10/26/1953  .  Foot Exam -next appointment (discuss DM further) 10/26/1961  . Urine Microalbumin -today 10/26/1961  . Tetanus/tdap -given Rx to get it at pharmacy 10/27/1970  . Colonoscopy -given handout 10/26/2001  . Zostavax -deferred 10/27/2011    ROS--See HPI  Past Medical History Patient Active Problem List   Diagnosis Date Noted  . Diabetes Mellitus Type II 05/30/2011    Priority: High  . Tobacco abuse 05/30/2011    Priority: High  . Abdominal pain, unspecified site 09/28/2012    Priority: Medium  . COPD (chronic obstructive pulmonary disease) 04/27/2012    Priority: Medium  . Alcohol abuse 06/09/2011    Priority: Medium  . Chronic alcoholic pancreatitis [id number]    Priority: Medium  . Chest pain 08/22/2012  . Polyuria 08/21/2012  . Overweight (BMI 25.0-29.9) 03/27/2012  . GERD (gastroesophageal reflux disease) 03/05/2012  . Hyperlipidemia 05/30/2011   Surgery history-back surgery though specifics unknown  Reviewed problem list.  Medications- reviewed and updated Chief complaint-noted  Objective: BP 124/79  Pulse 86  Temp(Src) 97.8 F (36.6 C) (Oral)  Ht 6\' 3"  (1.905 m)  Wt 202 lb 6.4 oz (91.808 kg)  BMI 25.3 kg/m2 Gen: NAD, resting comfortably CV: RRR no murmurs rubs or gallops Lungs: CTAB no crackles, wheeze, rhonchi Abd: no tenderness. No rebound/guarding. Normal bowel sounds. Soft.  Skin: warm, dry Neuro: grossly normal, moves all extremities Ext: no edema Back - Normal skin with exception of midline scar over lumbar spine, Spine with normal alignment and no deformity.  No tenderness to vertebral process palpation.  Paraspinous muscles in  lumbar region are tender but without spasm.   Range of motion is full at neck and lumbar sacral regions. Normal strength in bilaterally lower extremities. Negative straight leg raise. Hip with internal and external rotation without pain.    Assessment/Plan:

## 2012-12-21 NOTE — Assessment & Plan Note (Signed)
Continues in abstinence. Encouraged continued AA given history of recurrence.

## 2012-12-21 NOTE — Assessment & Plan Note (Signed)
No red flags. History of surgery and worsened with sitting concerning for disc hernation/compression. Will trial tramadol as pain typically only twice a week (patient with high pain tolerance though with history of oxycodone with recurrent pancreatitis). Patient also trying to find name of orthopedist as may need to refer back eventually plus would be good to have records. Could also consider PT or repeat imaging if not improving in about 6 weeks.

## 2012-12-21 NOTE — Patient Instructions (Signed)
1. Your diabetes is doing ok. Your a1c was 6.8 which is less than 7.  2. For your low back pain, take tramadol 1-2 x a week when your pain gets bad such as before or after mowing.   *get me the name of your old surgeon so we can get records  *if things do not improve on this, we may send you to physical therapy, get another picture, or send yout o your old doctor.  3. For your breathing, I am glad you are doing ok with the albuterol inhaler once a week.   See me at my next available appointment (1-2 weeks if possible),  Dr. Yong Channel  Health Maintenance Due  Topic Date Due  . Pneumococcal Polysaccharide Vaccine (#1)-receive today 10/26/1953  . Foot Exam -next visit 10/26/1961  . Urine Microalbumin -today 10/26/1961  . Tetanus/tdap -today 10/27/1970  . Colonoscopy - see handout 10/26/2001  . Zostavax  -discuss next visit 10/27/2011

## 2012-12-21 NOTE — Assessment & Plan Note (Signed)
Well controlled. Appropriately using albuterol now. Continue current medications.

## 2012-12-21 NOTE — Assessment & Plan Note (Signed)
Diet and exercise controlled. Discussed not drinking 2 sweet green teas per day. Mainly water with perhaps occasional tea given well controlled currently. Patient to return for further education and diabetic foot exam.

## 2012-12-21 NOTE — Assessment & Plan Note (Signed)
Hold off on statin despite DM. Will need repeat LFTs and normalization before initiating statin.

## 2012-12-22 LAB — MICROALBUMIN / CREATININE URINE RATIO
Creatinine, Urine: 63.9 mg/dL
Microalb Creat Ratio: 7.8 mg/g (ref 0.0–30.0)
Microalb, Ur: 0.5 mg/dL (ref 0.00–1.89)

## 2012-12-24 ENCOUNTER — Telehealth: Payer: Self-pay | Admitting: *Deleted

## 2012-12-24 NOTE — Telephone Encounter (Signed)
LM with lab results.  See below.  Timothy Martinez, Timothy Martinez, Timothy Martinez

## 2012-12-24 NOTE — Telephone Encounter (Signed)
Message copied by Lazaro Arms on Mon Dec 24, 2012  8:40 AM ------      Message from: Marin Olp      Created: Sat Dec 22, 2012  9:59 PM       Microalbumin/cr ratio wnl. Plese inform patient no signs early kidney damage from DM. ------

## 2013-01-14 ENCOUNTER — Ambulatory Visit: Payer: Medicare Other | Admitting: Family Medicine

## 2013-01-28 ENCOUNTER — Encounter: Payer: Self-pay | Admitting: Family Medicine

## 2013-01-28 ENCOUNTER — Ambulatory Visit (INDEPENDENT_AMBULATORY_CARE_PROVIDER_SITE_OTHER): Payer: Medicare Other | Admitting: Family Medicine

## 2013-01-28 VITALS — BP 128/70 | HR 86 | Temp 97.9°F | Ht 75.0 in | Wt 204.0 lb

## 2013-01-28 DIAGNOSIS — M545 Low back pain, unspecified: Secondary | ICD-10-CM

## 2013-01-28 DIAGNOSIS — F17200 Nicotine dependence, unspecified, uncomplicated: Secondary | ICD-10-CM

## 2013-01-28 DIAGNOSIS — E785 Hyperlipidemia, unspecified: Secondary | ICD-10-CM

## 2013-01-28 DIAGNOSIS — E1110 Type 2 diabetes mellitus with ketoacidosis without coma: Secondary | ICD-10-CM

## 2013-01-28 DIAGNOSIS — Z720 Tobacco use: Secondary | ICD-10-CM

## 2013-01-28 MED ORDER — ATORVASTATIN CALCIUM 40 MG PO TABS: 40.0000 mg | ORAL_TABLET | Freq: Every day | ORAL | Status: DC

## 2013-01-28 NOTE — Assessment & Plan Note (Signed)
Well controlled on 1/2 tab tramadol. Will continue prn.

## 2013-01-28 NOTE — Assessment & Plan Note (Signed)
Well controlled. Discussed if any elevation in a1c at next visit, would strongly push for metformin but patient desires healthy lifestyle modifications first. He has had minimal weight loss over last year but has had no weight gain and stayed stable around 205. Walking everyday. Praised efforts. Stressed vegetables as mainstay of diet. Could consider diabetes education as well.

## 2013-01-28 NOTE — Progress Notes (Signed)
[facility] Timothy Reddish, MD Phone: [phone]  Subjective:   # Low Back Pain Improved overall. Still across lower back. Still working on getting the records. Pain well controlled with 1/2 tablet of tramadol 50mg . No fecal or urinary incontinence, no numbness tingling, no leg weakness, no saddle anesthesia.   # Diabetes Walking at least a mile a day everyday. Weight stable.  Quit drinking sweet green tea. Drinking mainly water. Last a1c 6.8. Patient desires to continue lifestyle modification instead of starting metformin. No blurry vision, polydipsia. No hypolglycemic symptoms.   #Tobacco abuse/Hyperlipidemia  Down to 1 PPD. 0/10 readiness to quit. Counseled on importance of cessation. Discussed 34% CV risk between tobacco, diabetes, and HLD over 10 years.   LDL elevated a year ago to 114. With new guidelines, discussed need for high intensity statin therapy. Patient agreeable. No chest pain or shortness of breath.   ROS--See HPI  Past Medical History Patient Active Problem List   Diagnosis Date Noted  . Diabetes Mellitus Type II 05/30/2011    Priority: High  . Tobacco abuse 05/30/2011    Priority: High  . Abdominal pain, unspecified site 09/28/2012    Priority: Medium  . Chest pain 08/22/2012    Priority: Medium  . COPD (chronic obstructive pulmonary disease) 04/27/2012    Priority: Medium  . Alcohol abuse 06/09/2011    Priority: Medium  . Chronic alcoholic pancreatitis [id number]    Priority: Medium  . Hyperlipidemia 05/30/2011    Priority: Medium  . Overweight (BMI 25.0-29.9) 03/27/2012    Priority: Low  . GERD (gastroesophageal reflux disease) 03/05/2012    Priority: Low  . Low back pain 12/21/2012  . Polyuria 08/21/2012  Reviewed problem list.  Medications- reviewed and updated Chief complaint-noted  Objective: BP 128/70  Pulse 86  Temp(Src) 97.9 F (36.6 C) (Oral)  Ht 6\' 3"  (1.905 m)  Wt 204 lb (92.534 kg)  BMI 25.5 kg/m2 Gen:  NAD, resting comfortably in chair, smells of smoke CV: RRR no murmurs rubs or gallops Lungs: CTAB no crackles, wheeze, rhonchi Skin: warm, dry Neuro: grossly normal, moves all extremities Low back: no tenderness to palpation. 5/5 strength lower extremities.  Diabetic foot exam: normal to monofilament testing, 2+ DP, PT pulses, no callous, ulcers  Assessment/Plan:  Consider prostate exam next visit when readdressing polyuria.

## 2013-01-28 NOTE — Patient Instructions (Addendum)
1. Great job walking everyday. See healthy lifestyle habits below. Your goal is to not have to take diabetes medications and you will have to continue your efforts to do this.  2. Please let us know when you are ready to quit smoking. 1800quitnow is a free resource.  3. I am glad tramadol is helping your low back. Get [place] those records when able. Remember to only use it when your back is hurting such as when you mow the lawn.  4. We are going to start you on a cholesterol medicine to help lower your risk of heart attack and stroke. Take it everyday.   Health Maintenance Due  Topic Date Due  . Foot Exam -today 10/26/1961  . Tetanus/tdap -ask your insurance, see handout 10/27/1970  . Colonoscopy -see handout 10/26/2001  . Zostavax -ask your insurance, see handout 10/27/2011   See me in September (2 months) and we will repeat your a1c or sooner as needed. We are going to repeat your labs at your next visit (don't eat before you come if you can get a morning appointment).  Dr. Yong Channel

## 2013-01-28 NOTE — Assessment & Plan Note (Addendum)
Elevated LDL. Will initiate high intensity statin (lipitor). Will have patient follow up in 2 months and check LFTs again at that time (not elevated at last check). Also check lipid panel.

## 2013-01-28 NOTE — Assessment & Plan Note (Signed)
1 PPD. Not ready to quit. Counseled on importance of cessation. CV risk 34% in 10 years with DM, HLD, HTN.

## 2013-01-31 ENCOUNTER — Other Ambulatory Visit: Payer: Self-pay

## 2013-03-11 ENCOUNTER — Other Ambulatory Visit: Payer: Self-pay | Admitting: *Deleted

## 2013-03-11 DIAGNOSIS — K219 Gastro-esophageal reflux disease without esophagitis: Secondary | ICD-10-CM

## 2013-03-11 MED ORDER — RANITIDINE HCL 150 MG PO TABS: 150.0000 mg | ORAL_TABLET | Freq: Two times a day (BID) | ORAL | Status: DC

## 2013-04-11 ENCOUNTER — Encounter: Payer: Self-pay | Admitting: Family Medicine

## 2013-04-11 ENCOUNTER — Ambulatory Visit (INDEPENDENT_AMBULATORY_CARE_PROVIDER_SITE_OTHER): Payer: Medicare Other | Admitting: Family Medicine

## 2013-04-11 VITALS — BP 122/70 | HR 80 | Temp 97.7°F | Ht 75.0 in | Wt 205.0 lb

## 2013-04-11 DIAGNOSIS — E785 Hyperlipidemia, unspecified: Secondary | ICD-10-CM

## 2013-04-11 DIAGNOSIS — Z1211 Encounter for screening for malignant neoplasm of colon: Secondary | ICD-10-CM

## 2013-04-11 DIAGNOSIS — F1010 Alcohol abuse, uncomplicated: Secondary | ICD-10-CM

## 2013-04-11 DIAGNOSIS — M545 Low back pain, unspecified: Secondary | ICD-10-CM

## 2013-04-11 DIAGNOSIS — Z720 Tobacco use: Secondary | ICD-10-CM

## 2013-04-11 DIAGNOSIS — Z23 Encounter for immunization: Secondary | ICD-10-CM

## 2013-04-11 DIAGNOSIS — R358 Other polyuria: Secondary | ICD-10-CM

## 2013-04-11 DIAGNOSIS — F17200 Nicotine dependence, unspecified, uncomplicated: Secondary | ICD-10-CM

## 2013-04-11 DIAGNOSIS — J449 Chronic obstructive pulmonary disease, unspecified: Secondary | ICD-10-CM

## 2013-04-11 DIAGNOSIS — R3589 Other polyuria: Secondary | ICD-10-CM

## 2013-04-11 DIAGNOSIS — E1110 Type 2 diabetes mellitus with ketoacidosis without coma: Secondary | ICD-10-CM

## 2013-04-11 DIAGNOSIS — R3911 Hesitancy of micturition: Secondary | ICD-10-CM

## 2013-04-11 LAB — COMPREHENSIVE METABOLIC PANEL
ALT: 25 U/L (ref 0–53)
AST: 25 U/L (ref 0–37)
Albumin: 4.2 g/dL (ref 3.5–5.2)
Alkaline Phosphatase: 70 U/L (ref 39–117)
BUN: 13 mg/dL (ref 6–23)
CO2: 26 mEq/L (ref 19–32)
Calcium: 9.5 mg/dL (ref 8.4–10.5)
Chloride: 103 mEq/L (ref 96–112)
Creat: 0.77 mg/dL (ref 0.50–1.35)
Glucose, Bld: 124 mg/dL — ABNORMAL HIGH (ref 70–99)
Potassium: 4.5 mEq/L (ref 3.5–5.3)
Sodium: 139 mEq/L (ref 135–145)
Total Bilirubin: 0.7 mg/dL (ref 0.3–1.2)
Total Protein: 8 g/dL (ref 6.0–8.3)

## 2013-04-11 LAB — POCT GLYCOSYLATED HEMOGLOBIN (HGB A1C): Hemoglobin A1C: 6.5

## 2013-04-11 MED ORDER — TRAMADOL HCL 50 MG PO TABS: 25.0000 mg | ORAL_TABLET | Freq: Every day | ORAL | Status: DC | PRN

## 2013-04-11 MED ORDER — ALBUTEROL SULFATE HFA 108 (90 BASE) MCG/ACT IN AERS: 2.0000 | INHALATION_SPRAY | RESPIRATORY_TRACT | Status: DC | PRN

## 2013-04-11 NOTE — Patient Instructions (Addendum)
1. Refilled proair 2. Refilled tramadol. 1/2 tab a day maximum. Will need physical therapy when life situation enables you to go.  3. I am sorry things are not going well with your sister. We are here to support you.  4. I advise you to quit smoking. We are here to help.  5. For prostate, it seems slightly enlarged and likely causing your bathroom issues. You are going to look into PSA (blood test) with medicare to see if they weill cover it.  6. For your labs, I will send you a letter if there are no medication changes needed. I will call you if we need to discuss your lab results.   Health Maintenance Due  Topic Date Due  . Tetanus/tdap -next visit 10/27/1970  . Colonoscopy -will refer today 10/26/2001  . Influenza Vaccine -today 02/22/2013   See me in 3 months,  Dr. Yong Channel

## 2013-04-12 NOTE — Assessment & Plan Note (Signed)
Difficulty with inhaler. Difficult to assess control. Refilled albuterol and asked that brand not be proair.

## 2013-04-12 NOTE — Assessment & Plan Note (Signed)
Well controlled on prn tramadol 1/2 tablet. Will continue for now with understanding that when life events allow-he will need PT.

## 2013-04-12 NOTE — Assessment & Plan Note (Signed)
1 ppd. Not ready to quit. Encouraged cessation.

## 2013-04-12 NOTE — Assessment & Plan Note (Signed)
Rectal today and suspect BPH as a result. Ordered PSA but may not be covered by insurance so patient refused. Patient does not want to start flomax at this time as he is concerned could cause pancreatitis. He is going to read some more and call me if he changes his mind.

## 2013-04-12 NOTE — Assessment & Plan Note (Signed)
Well controlled. a1c trending down to 6.5. Patient wants to try to control with diet/exercise and refuses metformin at present.

## 2013-04-12 NOTE — Progress Notes (Signed)
[facility] Timothy Reddish, MD Phone: [phone]  Subjective:  Chief complaint-noted  # COPD/tobacco abuse Has trouble using proair inhaler-does not seem to put out any albuterol when he presses button. I tried several times and about 1/3 times it seemed to work. Patient states he tries to use the inhaler about 3x a week and when it works it helps some. He is requesting a different inhaler (not name brand proair b/c this is his 2nd inhaler and does nto seem to work like proventil inhaler he had). He is able to exercise each week without difficulty. Mainly takes albuterol for cough (may be smoker cough) as SOB is minimal though he feels like albuterol helps with cough. 1 ppd smoking. Not ready to quit ROS-No chest pain. No unexplained weight loss.   #Low back pain No records yet received from previous surgery. Low back pain around L4-L5 in paraspinous muscles. Hurts worse with prolonged sitting like while on lawnmower. 1/2 a tramadol after this controls pain typically. Avoids tylenol due to history elevated LFTs (now not elevated). Patient had been told not to take ibuprofen at some point (unclear why).    History of surgery on low back within last 10 years. Cannot remember physicians name in order to allow [place] to get records. Patient states about twice a week he will get flares of his low back pain. Located in paraspinous area with some radiation to buttocks and posterior thighs. Most typically occurs when in seated position for prolonged period (most notably 10/10 pain after he rides lawnmower for 3 hours which then goes to 5/10 next morning without medication. Also hurts if stays in recliner for prolonged period.  History of elevated LFTs which have come back down after pancreatitis resolves. Not able to take tylenol. Patient thinks he was told not to take ibuprofen when he was in the hospital and I do see a stop medication when he left the hospital though it is not clear why  ibuprofen not indicated.   ROS- saddle anesthesia, fecal or urinary inctontinence,  focal neurological deficits including weakness, no numbness or tingling  In feet either.   # Nocturia 3x nightly. No dysuria. For 6 months at least. Stable since worsening for first month. Also with polyuria in daytime up to 3x a day. a1c 6.5 today down from 6.8.   # DM II-likely contributed to by several bouts of pancreatitis Exercising daily. a1c trending down. Watching foods he is eating. Still refuses medication  ROS--See HPI  Past Medical History Patient Active Problem List   Diagnosis Date Noted  . Diabetes Mellitus Type II 05/30/2011    Priority: High  . Tobacco abuse 05/30/2011    Priority: High  . Abdominal pain, unspecified site 09/28/2012    Priority: Medium  . Chest pain 08/22/2012    Priority: Medium  . COPD (chronic obstructive pulmonary disease) 04/27/2012    Priority: Medium  . Alcohol abuse 06/09/2011    Priority: Medium  . Chronic alcoholic pancreatitis [id number]    Priority: Medium  . Hyperlipidemia 05/30/2011    Priority: Medium  . Overweight (BMI 25.0-29.9) 03/27/2012    Priority: Low  . GERD (gastroesophageal reflux disease) 03/05/2012    Priority: Low  . Low back pain 12/21/2012  . Polyuria 08/21/2012   Reviewed problem list.  Medications- reviewed and updated Current Outpatient Prescriptions on File Prior to Visit  Medication Sig Dispense Refill  . aspirin 81 MG tablet Take 81 mg by mouth daily.      Marland Kitchen  atorvastatin (LIPITOR) 40 MG tablet Take 1 tablet (40 mg total) by mouth daily.  30 tablet  11  . Multiple Vitamins-Minerals (MULTIVITAMINS THER. W/MINERALS) TABS Take 1 tablet by mouth daily.      . ranitidine (ZANTAC) 150 MG tablet Take 1 tablet (150 mg total) by mouth 2 (two) times daily.  60 tablet  11  . thiamine (VITAMIN B-1) 100 MG tablet Take 100 mg by mouth daily.       No current facility-administered medications on file prior to visit.     Objective: BP 122/70  Pulse 80  Temp(Src) 97.7 F (36.5 C) (Oral)  Ht 6\' 3"  (1.905 m)  Wt 205 lb (92.987 kg)  BMI 25.62 kg/m2 Gen: NAD, resting comfortably in chair CV: RRR no murmurs rubs or gallops Lungs: CTAB no crackles, wheeze, rhonchi Skin: warm, dry Neuro: grossly normal, moves all extremities Ext: no edema Back - Normal skin with exception of midline scar over lumbar spine, Spine with normal alignment and no deformity.  No tenderness to vertebral process palpation.  Paraspinous muscles in lumbar region are tender but without spasm.   Range of motion is full at neck and lumbar sacral regions. Normal strength in bilaterally lower extremities. Negative straight leg raise. Hip with internal and external rotation without pain. Negative Fabers  Rectal; slight diffuse enlargement of prostate. No masses or nodules.   Assessment/Plan:  Of note, patient's sister is in hospice, phq2 alerted for phq9 per nursing staff. Phq9 score of 5 with no SI/HI and states "not difficult at all" for effect on life. Will continue to monitor.

## 2013-04-12 NOTE — Assessment & Plan Note (Signed)
Not drinking at present. Praised efforts.

## 2013-04-16 ENCOUNTER — Encounter (HOSPITAL_COMMUNITY): Payer: Self-pay | Admitting: *Deleted

## 2013-04-16 ENCOUNTER — Inpatient Hospital Stay (HOSPITAL_COMMUNITY)
Admission: EM | Admit: 2013-04-16 | Discharge: 2013-04-20 | DRG: 440 | Disposition: A | Discharge status: Home / Self Care | Payer: Medicare Other | Attending: Family Medicine | Admitting: Family Medicine

## 2013-04-16 DIAGNOSIS — K860 Alcohol-induced chronic pancreatitis: Secondary | ICD-10-CM

## 2013-04-16 DIAGNOSIS — M545 Low back pain, unspecified: Secondary | ICD-10-CM | POA: Diagnosis present

## 2013-04-16 DIAGNOSIS — G8929 Other chronic pain: Secondary | ICD-10-CM | POA: Diagnosis present

## 2013-04-16 DIAGNOSIS — J4489 Other specified chronic obstructive pulmonary disease: Secondary | ICD-10-CM | POA: Diagnosis present

## 2013-04-16 DIAGNOSIS — K859 Acute pancreatitis without necrosis or infection, unspecified: Principal | ICD-10-CM | POA: Diagnosis present

## 2013-04-16 DIAGNOSIS — K861 Other chronic pancreatitis: Secondary | ICD-10-CM | POA: Diagnosis present

## 2013-04-16 DIAGNOSIS — K219 Gastro-esophageal reflux disease without esophagitis: Secondary | ICD-10-CM | POA: Diagnosis present

## 2013-04-16 DIAGNOSIS — E785 Hyperlipidemia, unspecified: Secondary | ICD-10-CM | POA: Diagnosis present

## 2013-04-16 DIAGNOSIS — F1010 Alcohol abuse, uncomplicated: Secondary | ICD-10-CM | POA: Diagnosis present

## 2013-04-16 DIAGNOSIS — J449 Chronic obstructive pulmonary disease, unspecified: Secondary | ICD-10-CM

## 2013-04-16 DIAGNOSIS — Z7982 Long term (current) use of aspirin: Secondary | ICD-10-CM

## 2013-04-16 DIAGNOSIS — E1110 Type 2 diabetes mellitus with ketoacidosis without coma: Secondary | ICD-10-CM

## 2013-04-16 DIAGNOSIS — F17200 Nicotine dependence, unspecified, uncomplicated: Secondary | ICD-10-CM | POA: Diagnosis present

## 2013-04-16 DIAGNOSIS — Z720 Tobacco use: Secondary | ICD-10-CM

## 2013-04-16 DIAGNOSIS — E119 Type 2 diabetes mellitus without complications: Secondary | ICD-10-CM | POA: Diagnosis present

## 2013-04-16 DIAGNOSIS — Z79899 Other long term (current) drug therapy: Secondary | ICD-10-CM

## 2013-04-16 DIAGNOSIS — J439 Emphysema, unspecified: Secondary | ICD-10-CM | POA: Diagnosis present

## 2013-04-16 LAB — URINALYSIS, ROUTINE W REFLEX MICROSCOPIC
Bilirubin Urine: NEGATIVE
Glucose, UA: NEGATIVE mg/dL
Ketones, ur: NEGATIVE mg/dL
Leukocytes, UA: NEGATIVE
Nitrite: NEGATIVE
Protein, ur: NEGATIVE mg/dL
Specific Gravity, Urine: 1.014 (ref 1.005–1.030)
Urobilinogen, UA: 0.2 mg/dL (ref 0.0–1.0)
pH: 5.5 (ref 5.0–8.0)

## 2013-04-16 LAB — CBC WITH DIFFERENTIAL/PLATELET
Basophils Absolute: 0.1 10*3/uL (ref 0.0–0.1)
Basophils Relative: 1 % (ref 0–1)
Eosinophils Absolute: 0.2 10*3/uL (ref 0.0–0.7)
Eosinophils Relative: 2 % (ref 0–5)
HCT: 48.1 % (ref 39.0–52.0)
Hemoglobin: 17.4 g/dL — ABNORMAL HIGH (ref 13.0–17.0)
Lymphocytes Relative: 31 % (ref 12–46)
Lymphs Abs: 4 10*3/uL (ref 0.7–4.0)
MCH: 32.8 pg (ref 26.0–34.0)
MCHC: 36.2 g/dL — ABNORMAL HIGH (ref 30.0–36.0)
MCV: 90.8 fL (ref 78.0–100.0)
Monocytes Absolute: 1.9 10*3/uL — ABNORMAL HIGH (ref 0.1–1.0)
Monocytes Relative: 15 % — ABNORMAL HIGH (ref 3–12)
Neutro Abs: 6.6 10*3/uL (ref 1.7–7.7)
Neutrophils Relative %: 51 % (ref 43–77)
Platelets: 234 10*3/uL (ref 150–400)
RBC: 5.3 MIL/uL (ref 4.22–5.81)
RDW: 14.2 % (ref 11.5–15.5)
WBC: 12.9 10*3/uL — ABNORMAL HIGH (ref 4.0–10.5)

## 2013-04-16 LAB — POCT I-STAT TROPONIN I: Troponin i, poc: 0 ng/mL (ref 0.00–0.08)

## 2013-04-16 LAB — COMPREHENSIVE METABOLIC PANEL
ALT: 27 U/L (ref 0–53)
AST: 30 U/L (ref 0–37)
Albumin: 4.3 g/dL (ref 3.5–5.2)
Alkaline Phosphatase: 94 U/L (ref 39–117)
BUN: 11 mg/dL (ref 6–23)
CO2: 28 mEq/L (ref 19–32)
Calcium: 9.4 mg/dL (ref 8.4–10.5)
Chloride: 101 mEq/L (ref 96–112)
Creatinine, Ser: 0.81 mg/dL (ref 0.50–1.35)
GFR calc Af Amer: >90 mL/min (ref >90)
GFR calc non Af Amer: >90 mL/min (ref >90)
Glucose, Bld: 123 mg/dL — ABNORMAL HIGH (ref 70–99)
Potassium: 4 mEq/L (ref 3.5–5.1)
Sodium: 140 mEq/L (ref 135–145)
Total Bilirubin: 0.7 mg/dL (ref 0.3–1.2)
Total Protein: 8.5 g/dL — ABNORMAL HIGH (ref 6.0–8.3)

## 2013-04-16 LAB — URINE MICROSCOPIC-ADD ON

## 2013-04-16 LAB — LIPASE, BLOOD: Lipase: 577 U/L — ABNORMAL HIGH (ref 11–59)

## 2013-04-16 MED ORDER — SODIUM CHLORIDE 0.9 % IV SOLN
INTRAVENOUS | Status: DC
  Administered 2013-04-17 – 2013-04-20 (×7): via INTRAVENOUS

## 2013-04-16 MED ORDER — ASPIRIN 81 MG PO CHEW
81.0000 mg | CHEWABLE_TABLET | Freq: Every day | ORAL | Status: DC
  Administered 2013-04-17 – 2013-04-20 (×4): 81 mg via ORAL
  Filled 2013-04-16 (×4): qty 1

## 2013-04-16 MED ORDER — FAMOTIDINE 20 MG PO TABS
20.0000 mg | ORAL_TABLET | Freq: Every day | ORAL | Status: DC
  Filled 2013-04-16: qty 1

## 2013-04-16 MED ORDER — ALBUTEROL SULFATE HFA 108 (90 BASE) MCG/ACT IN AERS
2.0000 | INHALATION_SPRAY | RESPIRATORY_TRACT | Status: DC | PRN
  Filled 2013-04-16: qty 6.7

## 2013-04-16 MED ORDER — SODIUM CHLORIDE 0.9 % IV BOLUS (SEPSIS)
500.0000 mL | Freq: Once | INTRAVENOUS | Status: AC
  Administered 2013-04-16: 500 mL via INTRAVENOUS

## 2013-04-16 MED ORDER — SODIUM CHLORIDE 0.9 % IV SOLN
INTRAVENOUS | Status: AC
  Administered 2013-04-16 – 2013-04-17 (×2): via INTRAVENOUS

## 2013-04-16 MED ORDER — ONDANSETRON 4 MG PO TBDP
8.0000 mg | ORAL_TABLET | Freq: Once | ORAL | Status: AC
  Administered 2013-04-16: 8 mg via ORAL
  Filled 2013-04-16: qty 2

## 2013-04-16 MED ORDER — ADULT MULTIVITAMIN W/MINERALS CH
1.0000 | ORAL_TABLET | Freq: Every day | ORAL | Status: DC
  Administered 2013-04-17 – 2013-04-20 (×4): 1 via ORAL
  Filled 2013-04-16 (×4): qty 1

## 2013-04-16 MED ORDER — HYDROMORPHONE HCL PF 1 MG/ML IJ SOLN
1.0000 mg | INTRAMUSCULAR | Status: DC | PRN
  Administered 2013-04-17 (×2): 1 mg via INTRAVENOUS
  Filled 2013-04-16 (×2): qty 1

## 2013-04-16 MED ORDER — HYDROMORPHONE HCL PF 1 MG/ML IJ SOLN
1.0000 mg | Freq: Once | INTRAMUSCULAR | Status: AC
  Administered 2013-04-16: 1 mg via INTRAVENOUS
  Filled 2013-04-16: qty 1

## 2013-04-16 MED ORDER — ENOXAPARIN SODIUM 40 MG/0.4ML ~~LOC~~ SOLN
40.0000 mg | SUBCUTANEOUS | Status: DC
  Administered 2013-04-16 – 2013-04-19 (×4): 40 mg via SUBCUTANEOUS
  Filled 2013-04-16 (×5): qty 0.4

## 2013-04-16 MED ORDER — ONDANSETRON HCL 4 MG PO TABS: 4.0000 mg | ORAL_TABLET | Freq: Four times a day (QID) | ORAL | Status: DC | PRN

## 2013-04-16 MED ORDER — VITAMIN B-1 100 MG PO TABS
100.0000 mg | ORAL_TABLET | Freq: Every day | ORAL | Status: DC
  Administered 2013-04-17 – 2013-04-20 (×4): 100 mg via ORAL
  Filled 2013-04-16 (×4): qty 1

## 2013-04-16 MED ORDER — ONDANSETRON HCL 4 MG/2ML IJ SOLN: 4.0000 mg | Freq: Four times a day (QID) | INTRAMUSCULAR | Status: DC | PRN

## 2013-04-16 MED ORDER — FOLIC ACID 1 MG PO TABS
1.0000 mg | ORAL_TABLET | Freq: Every day | ORAL | Status: DC
  Administered 2013-04-17 – 2013-04-20 (×4): 1 mg via ORAL
  Filled 2013-04-16 (×4): qty 1

## 2013-04-16 NOTE — ED Notes (Signed)
Report given to [place], [place] unit 6N

## 2013-04-16 NOTE — ED Notes (Signed)
MD at bedside. 

## 2013-04-16 NOTE — H&P (Signed)
[facility] Admission History and Physical Service Pager: [phone]  Patient name: Timothy Martinez Medical record number: [id number] Date of birth: 12-17-1950 Age: 62 y.o. Gender: male  Primary Care Provider: [name], MD Consultants: none Code Status: Full  Chief Complaint: abdominal pain  Assessment and Plan: Timothy Martinez is a 62 y.o. male presenting with abdominal pain x 2 days. PMH is significant for chronic alcoholic pancreatitis, COPD, DM, hyperlipidemia, GERD.  Pain is epigastric and LUQ with radiation to back, does not appear to be an acute abdomen.   # Pancreatitis: has previous episodes of pancreatitis related to alcohol abuse. Denies current alcohol use. Lipase 577. WBC 12.9, Hgb 17.4, Cmet wnl. UA trace hgb. No imaging done in ED. Currently hemodynamically stable. - Admit to med-surg - Vitals per floor - NPO - NS 150cc/hr - Dilaudid 1mg  q4hr PRN for pain control - CIWA protocol - Morning Bmet and CBC  # COPD: no current complaints of SOB, not requiring oxygen. - Albuterol mdi PRN  # DM: diet controlled, last A1c 6.8 - CBGs AC, hs - Will give insulin if he develops hyperglycemia  FEN/GI: NS 150cc/hr, NPO Prophylaxis: lovenox  Disposition: admit to med-surg  History of Present Illness: Timothy Martinez is a 62 y.o. male presenting with abdominal pain following eating spicy food. Started Monday morning and has persisted. Pain initially epigastric now feels more to the left, feels like past episodes of pancreatitis. Pain does occasionally radiate to the back. Endorses nausea, but no episodes of vomiting (though twice he says he came close). Ate some chicken noodle soup earlier today that made his pain worse. Woke up this morning with a little bit of sweating. Denies any recent alcohol use. Denies chest pain, SOB, diarrhea/constipation.  He denies alcohol use currently but states that he had several O'Doul's on Sunday, on his last  admission he freely admitted alcohol ingestion.   Review Of Systems: Per HPI with the following additions: none Otherwise 12 point review of systems was performed and was unremarkable.  Patient Active Problem List   Diagnosis Date Noted  . Low back pain 12/21/2012  . Abdominal pain, unspecified site 09/28/2012  . Chest pain 08/22/2012  . Polyuria 08/21/2012  . COPD (chronic obstructive pulmonary disease) 04/27/2012  . Overweight (BMI 25.0-29.9) 03/27/2012  . GERD (gastroesophageal reflux disease) 03/05/2012  . Alcohol abuse 06/09/2011  . Diabetes Mellitus Type II 05/30/2011  . Chronic alcoholic pancreatitis [id number]  . Hyperlipidemia 05/30/2011  . Tobacco abuse 05/30/2011   Past Medical History: Past Medical History  Diagnosis Date  . Pancreas disorder   . Diabetes mellitus   . Hyperlipidemia   . Shortness of breath     with ambulation  . Anginal pain   . Pneumonia   . GERD (gastroesophageal reflux disease)   . Headache(784.0)   . Arthritis    Past Surgical History: Past Surgical History  Procedure Laterality Date  . Back surgery    . Neck surgery      not cervical, states had lesion on neck which was removed  . Splenectomy     Social History: History  Substance Use Topics  . Smoking status: Current Every Day Smoker -- 1.00 packs/day for 45 years    Types: Cigarettes    Start date: 04/09/1964  . Smokeless tobacco: Never Used     Comment: trying to quit. smoked 3 ppd before  . Alcohol Use: 0.0 oz/week    6-7 Cans of beer per week  Comment: quit  05/28/11   Additional social history: none  Please also refer to relevant sections of EMR.  Family History: Family History  Problem Relation Age of Onset  . Diabetes Mother   . Aneurysm Mother   . Alcohol abuse Father    Allergies and Medications: Allergies  Allergen Reactions  . Morphine And Related Swelling    Pt states he can tolerate oxycodone and hydromorphone  . Penicillins Rash   No current  facility-administered medications on file prior to encounter.   Current Outpatient Prescriptions on File Prior to Encounter  Medication Sig Dispense Refill  . albuterol (PROVENTIL HFA;VENTOLIN HFA) 108 (90 BASE) MCG/ACT inhaler Inhale 2 puffs into the lungs every 4 (four) hours as needed for wheezing or shortness of breath. May have other brand than proair.  1 Inhaler  3  . aspirin 81 MG tablet Take 81 mg by mouth daily.      Marland Kitchen atorvastatin (LIPITOR) 40 MG tablet Take 1 tablet (40 mg total) by mouth daily.  30 tablet  11  . Multiple Vitamins-Minerals (MULTIVITAMINS THER. W/MINERALS) TABS Take 1 tablet by mouth daily.      . ranitidine (ZANTAC) 150 MG tablet Take 1 tablet (150 mg total) by mouth 2 (two) times daily.  60 tablet  11  . thiamine (VITAMIN B-1) 100 MG tablet Take 100 mg by mouth daily.      . traMADol (ULTRAM) 50 MG tablet Take 0.5 tablets (25 mg total) by mouth daily as needed for pain (low back pain).  45 tablet  0    Objective: BP 125/74  Pulse 75  Temp(Src) 97.3 F (36.3 C) (Oral)  Resp 14  SpO2 95% Exam: General: NAD, lying in bed HEENT: sclera anicteric, PERRL, EOMI, MMM Cardiovascular: RRR, normal s1/s2, no murmurs Respiratory: CTAB, effort normal Abdomen: soft, TTP in epigastrium and LUQ, mild rebound. Bowel sounds present. Large horizontal surgical scar below epigastric area. Extremities: no leg edema, 2+ PT pulses bilaterally Skin: warm and dry, skin flushed Neuro: CN grossly normal. Motor: lower extremity 5/5, grip strength 5/5.  Sensation intact to light touch lower extermity. Perception of sensation is intact but different on L LE, states this is chronic after a back surgery.   Labs and Imaging: CBC BMET   Recent Labs Lab 04/16/13 1832  WBC 12.9*  HGB 17.4*  HCT 48.1  PLT 234    Recent Labs Lab 04/16/13 1832  NA 140  K 4.0  CL 101  CO2 28  BUN 11  CREATININE 0.81  GLUCOSE 123*  CALCIUM 9.4    Lipase 577  Tawanna Sat, MD 04/16/2013, 10:15  PM PGY-1, [place] Intern pager: [phone], text pages welcome   I have examined the patient with Dr. Lamar Benes and agree with his findings as Detailed above. My modifications are in blue.  Laroy Apple, MD [place] Resident, PGY-2 04/16/2013, 10:56 PM

## 2013-04-16 NOTE — ED Notes (Signed)
Pt is here with upper left abdominal pain and reports history of pancreatitis.  Pt denies vomiting or nausea.  Pt ate cajan food and it flared it up

## 2013-04-17 DIAGNOSIS — E119 Type 2 diabetes mellitus without complications: Secondary | ICD-10-CM | POA: Diagnosis present

## 2013-04-17 DIAGNOSIS — K859 Acute pancreatitis without necrosis or infection, unspecified: Secondary | ICD-10-CM | POA: Diagnosis present

## 2013-04-17 LAB — BASIC METABOLIC PANEL
BUN: 10 mg/dL (ref 6–23)
CO2: 24 mEq/L (ref 19–32)
Calcium: 8.8 mg/dL (ref 8.4–10.5)
Chloride: 103 mEq/L (ref 96–112)
Creatinine, Ser: 0.75 mg/dL (ref 0.50–1.35)
GFR calc Af Amer: >90 mL/min (ref >90)
GFR calc non Af Amer: >90 mL/min (ref >90)
Glucose, Bld: 120 mg/dL — ABNORMAL HIGH (ref 70–99)
Potassium: 4 mEq/L (ref 3.5–5.1)
Sodium: 136 mEq/L (ref 135–145)

## 2013-04-17 LAB — GLUCOSE, CAPILLARY
Glucose-Capillary: 100 mg/dL — ABNORMAL HIGH (ref 70–99)
Glucose-Capillary: 107 mg/dL — ABNORMAL HIGH (ref 70–99)
Glucose-Capillary: 108 mg/dL — ABNORMAL HIGH (ref 70–99)
Glucose-Capillary: 126 mg/dL — ABNORMAL HIGH (ref 70–99)
Glucose-Capillary: 93 mg/dL (ref 70–99)

## 2013-04-17 LAB — CBC
HCT: 45.9 % (ref 39.0–52.0)
Hemoglobin: 15.3 g/dL (ref 13.0–17.0)
MCH: 30.6 pg (ref 26.0–34.0)
MCHC: 33.3 g/dL (ref 30.0–36.0)
MCV: 91.8 fL (ref 78.0–100.0)
Platelets: 226 10*3/uL (ref 150–400)
RBC: 5 MIL/uL (ref 4.22–5.81)
RDW: 14.5 % (ref 11.5–15.5)
WBC: 12.1 10*3/uL — ABNORMAL HIGH (ref 4.0–10.5)

## 2013-04-17 MED ORDER — HYDROMORPHONE HCL PF 1 MG/ML IJ SOLN
1.0000 mg | INTRAMUSCULAR | Status: DC | PRN
  Administered 2013-04-17 – 2013-04-18 (×8): 1 mg via INTRAVENOUS
  Filled 2013-04-17 (×8): qty 1

## 2013-04-17 MED ORDER — PANTOPRAZOLE SODIUM 40 MG PO TBEC
40.0000 mg | DELAYED_RELEASE_TABLET | Freq: Every day | ORAL | Status: DC
  Administered 2013-04-17 – 2013-04-20 (×4): 40 mg via ORAL
  Filled 2013-04-17 (×4): qty 1

## 2013-04-17 NOTE — Progress Notes (Signed)
Attending Addendum  I have discussed the assessment and plan with Dr. Lamar Benes. I have reviewed the note and agree.    Timothy Martinez, Timothy Martinez

## 2013-04-17 NOTE — Progress Notes (Signed)
Family Medicine Teaching Service Daily Progress Note Intern Pager: [phone]  Patient name: Timothy Martinez Medical record number: [id number] Date of birth: 1951-05-15 Age: 62 y.o. Gender: male  Primary Care Provider: [name], MD Consultants: none Code Status: Full  Pt Overview and Major Events to Date:   Assessment and Plan: Timothy Martinez is a 62 y.o. male presenting with abdominal pain x 2 days. PMH is significant for chronic alcoholic pancreatitis, COPD, DM, hyperlipidemia, GERD. Pain is epigastric and LUQ with radiation to back, does not appear to be an acute abdomen.   # Pancreatitis: has previous episodes of pancreatitis related to alcohol abuse. Denies current alcohol use. Lipase 577. WBC 12.9, Hgb 17.4, Cmet wnl. UA trace hgb. No imaging done in ED. Currently hemodynamically stable. Ranson score 1. - NPO  - NS 150cc/hr  - CIWA protocol  - Morning Bmet wnl, WBC 12.1, Hgb 15.3 - Dilaudid 1mg  q3hr - If acutely worsens, becomes febrile: repeat CBC, blood cx x 2, vanc/zosyn, consider CT abd/pelvis  # COPD: no current complaints of SOB, not requiring oxygen.  - Albuterol mdi PRN   # DM: diet controlled, last A1c 6.8  - CBGs AC, hs  - Will give insulin if he develops hyperglycemia   # GERD: - Protonix 40mg  daily  # HLD: - Restart atorvastatin when no longer NPO.  FEN/GI: NS 150cc/hr, NPO  Prophylaxis: lovenox  Disposition: pending clinical course  Subjective:  Patient complains of pain after the 3rd hour after dilaudid. Describes pain as burning, like salt in a wound, in epigastrium and LUQ. He tried to drink some coffee this morning and it made it worse, I told him he should be NPO and at most only try small sips of water. He also asked about an inhaler for a little bit of difficulty breathing, it is ordered PRN and I informed him of this.   Objective: Temp:  [97.3 F (36.3 C)-97.9 F (36.6 C)] 97.3 F (36.3 C) (09/24 0520) Pulse Rate:  [65-93] 78 (09/24  0520) Resp:  [14-20] 17 (09/24 0520) BP: (122-177)/(67-85) 122/71 mmHg (09/24 0520) SpO2:  [95 %-98 %] 96 % (09/24 0520) Weight:  [201 lb 4.5 oz (91.3 kg)] 201 lb 4.5 oz (91.3 kg) (09/23 2351) Physical Exam: General: NAD, lying in bed  HEENT: sclera anicteric, PERRL, EOMI, MMM  Cardiovascular: RRR, normal s1/s2, no murmurs  Respiratory: CTAB, effort normal  Abdomen: soft, TTP in epigastrium and LUQ, mild rebound. Bowel sounds present. Large horizontal well healed surgical scar below epigastric area.  Extremities: no leg edema, 2+ PT pulses bilaterally  Laboratory:  Recent Labs Lab 04/16/13 1832  WBC 12.9*  HGB 17.4*  HCT 48.1  PLT 234    Recent Labs Lab 04/11/13 0929 04/16/13 1832  NA 139 140  K 4.5 4.0  CL 103 101  CO2 26 28  BUN 13 11  CREATININE 0.77 0.81  CALCIUM 9.5 9.4  PROT 8.0 8.5*  BILITOT 0.7 0.7  ALKPHOS 70 94  ALT 25 27  AST 25 30  GLUCOSE 124* 123*   Lipase 577  Imaging/Diagnostic Tests: None  Tawanna Sat, MD 04/17/2013, 7:26 AM PGY-1, [place] Intern pager: [phone], text pages welcome

## 2013-04-17 NOTE — Progress Notes (Signed)
PCP Social Visit Note [place]. Melanee Spry, MD, PGY-3  I have seen patient and discussed current hospitalization. I agree with the excellent care provided by the primary team of [facility] and want to thank them for their continued efforts in caring for my patient.   Discussed with patient once again that smoking is an independent risk factor for pancreatitis. I am hoping this hospitalization is the encouragement he needs to quit smoking though this will not guarantee a future free of pancreatitis.   [name], MD, PGY3 04/17/2013 3:54 PM

## 2013-04-17 NOTE — ED Provider Notes (Signed)
CSN: [id number]     Arrival date & time 04/16/13  1817 History   First MD Initiated Contact with Patient 04/16/13 2119     Chief Complaint  Patient presents with  . Abdominal Pain   (Consider location/radiation/quality/duration/timing/severity/associated sxs/prior Treatment) Patient is a 62 y.o. male presenting with abdominal pain. The history is provided by the patient.  Abdominal Pain Associated symptoms: nausea   Associated symptoms: no chest pain, no diarrhea, no shortness of breath and no vomiting    patient has had upper abdominal pain for the last couple days. States she some Cajun foods which she does not think he should it. He thinks his pancreatitis is flaring up. She's had nausea vomiting. He has had decreased oral intake. No diarrhea. No fevers. No trauma. He's a previous history of pancreatitis due to alcohol. States she is not drinking 8 months. [facility]  Past Medical History  Diagnosis Date  . Pancreas disorder   . Diabetes mellitus   . Hyperlipidemia   . Shortness of breath     with ambulation  . Anginal pain   . Pneumonia   . GERD (gastroesophageal reflux disease)   . Headache(784.0)   . Arthritis    Past Surgical History  Procedure Laterality Date  . Back surgery    . Neck surgery      not cervical, states had lesion on neck which was removed  . Splenectomy     Family History  Problem Relation Age of Onset  . Diabetes Mother   . Aneurysm Mother   . Alcohol abuse Father    History  Substance Use Topics  . Smoking status: Current Every Day Smoker -- 1.00 packs/day for 45 years    Types: Cigarettes    Start date: 04/09/1964  . Smokeless tobacco: Never Used     Comment: trying to quit. smoked 3 ppd before  . Alcohol Use: 0.0 oz/week    6-7 Cans of beer per week     Comment: quit  05/28/11    Review of Systems  Constitutional: Negative for activity change and appetite change.  HENT: Negative for  neck stiffness.   Eyes: Negative for pain.  Respiratory: Negative for chest tightness and shortness of breath.   Cardiovascular: Negative for chest pain and leg swelling.  Gastrointestinal: Positive for nausea and abdominal pain. Negative for vomiting and diarrhea.  Genitourinary: Negative for flank pain.  Musculoskeletal: Negative for back pain.  Skin: Negative for rash.  Neurological: Negative for weakness, numbness and headaches.  Psychiatric/Behavioral: Negative for behavioral problems.    Allergies  Morphine and related and Penicillins  Home Medications  No current outpatient prescriptions on file. BP 126/67  Pulse 65  Temp(Src) 97.9 F (36.6 C) (Oral)  Resp 16  Ht 5\' 3"  (1.6 m)  Wt 201 lb 4.5 oz (91.3 kg)  BMI 35.66 kg/m2  SpO2 95% Physical Exam  Nursing note and vitals reviewed. Constitutional: He is oriented to person, place, and time. He appears well-developed and well-nourished.  HENT:  Head: Normocephalic and atraumatic.  Eyes: EOM are normal. Pupils are equal, round, and reactive to light.  Neck: Normal range of motion. Neck supple.  Cardiovascular: Normal rate, regular rhythm and normal heart sounds.   No murmur heard. Pulmonary/Chest: Effort normal and breath sounds normal.  Abdominal: Soft. Bowel sounds are normal. He exhibits no distension and no mass. There is tenderness. There is no rebound and no guarding.  Moderate upper abdominal tenderness, worse on the left side.  Musculoskeletal: Normal range of motion. He exhibits no edema.  Neurological: He is alert and oriented to person, place, and time. No cranial nerve deficit.  Skin: Skin is warm and dry.  Psychiatric: He has a normal mood and affect.    ED Course  Procedures (including critical care time) Labs Review Labs Reviewed  CBC WITH DIFFERENTIAL - Abnormal; Notable for the following:    WBC 12.9 (*)    Hemoglobin 17.4 (*)    MCHC 36.2 (*)    Monocytes Relative 15 (*)    Monocytes Absolute  1.9 (*)    All other components within normal limits  COMPREHENSIVE METABOLIC PANEL - Abnormal; Notable for the following:    Glucose, Bld 123 (*)    Total Protein 8.5 (*)    All other components within normal limits  LIPASE, BLOOD - Abnormal; Notable for the following:    Lipase 577 (*)    All other components within normal limits  URINALYSIS, ROUTINE W REFLEX MICROSCOPIC - Abnormal; Notable for the following:    APPearance CLOUDY (*)    Hgb urine dipstick TRACE (*)    All other components within normal limits  GLUCOSE, CAPILLARY - Abnormal; Notable for the following:    Glucose-Capillary 108 (*)    All other components within normal limits  URINE MICROSCOPIC-ADD ON  BASIC METABOLIC PANEL  CBC  POCT I-STAT TROPONIN I   Imaging Review No results found.  MDM   1. Pancreatitis    Patient history of pancreatitis. He has a elevation of his lipase and mildly elevated white count. He is not tolerating orals and will be admitted to [facility]. Alvino Chapel, MD 04/17/13 [phone]

## 2013-04-17 NOTE — Progress Notes (Signed)
Patient requesting jello or ice cream. Aware of present NPO status. M.D. Made aware of patient's request. New order for clear liquid diet.

## 2013-04-17 NOTE — H&P (Addendum)
FMTS Attending Admission Note: Timothy Sabal MD Personal pager:  [phone] FPTS Service Pager:  [phone]  I  have seen and examined this patient, reviewed their chart. I have discussed this patient with the resident. I agree with the resident's findings, assessment and care plan.  Additionally:  Briefly, 62 yo M with multiple prior admits for recurrent pancreatitis, in the past secondary to alcoholism.  Presenting with 2 days of epigastric and LUQ pain s/p several meals of "Cajun food."  Denies EtOH usage.  Some nausea but no vomiting.  Came to ED due to unremitting pain, admitted to [facility].  This AM, still with pain, nausea, and no appetite  Exam: Gen:  Alert, cooperative patient who appears stated age in no acute distress.  Vital signs reviewed. Abdomen:  Soft/ND.  TTP along LUQ and epigastrum, moderate.  No guarding or rebound.  No flank or abdominal bruising noted Back:  Nontender Ext:  No edema  Imp/Plan: Pancreatitis: - Agree with current management - Keep NPO for now  - Ranson/APACHE scores low.  Will continue to monitor - follow lab values - Adding PPI and stopping H2 blocker due to degree of epigastric/LUQ pain.    Pain:   - short-term increase to Q3 hour Dilaudid as patient having relief for 3 out of 4 hours  Known hx/o alcoholism: - Agree with CIWA protocol  HLD:  - on statin at home, agree.   - Can restart once taking full PO  Alveda Reasons, MD 04/17/2013 7:33 AM

## 2013-04-18 DIAGNOSIS — K859 Acute pancreatitis without necrosis or infection, unspecified: Principal | ICD-10-CM

## 2013-04-18 LAB — GLUCOSE, CAPILLARY
Glucose-Capillary: 112 mg/dL — ABNORMAL HIGH (ref 70–99)
Glucose-Capillary: 120 mg/dL — ABNORMAL HIGH (ref 70–99)
Glucose-Capillary: 156 mg/dL — ABNORMAL HIGH (ref 70–99)
Glucose-Capillary: 75 mg/dL (ref 70–99)
Glucose-Capillary: 81 mg/dL (ref 70–99)
Glucose-Capillary: 89 mg/dL (ref 70–99)
Glucose-Capillary: 99 mg/dL (ref 70–99)

## 2013-04-18 LAB — CBC WITH DIFFERENTIAL/PLATELET
Basophils Absolute: 0.1 10*3/uL (ref 0.0–0.1)
Basophils Relative: 1 % (ref 0–1)
Eosinophils Absolute: 0.5 10*3/uL (ref 0.0–0.7)
Eosinophils Relative: 4 % (ref 0–5)
HCT: 43.7 % (ref 39.0–52.0)
Hemoglobin: 14.4 g/dL (ref 13.0–17.0)
Lymphocytes Relative: 35 % (ref 12–46)
Lymphs Abs: 4.1 10*3/uL — ABNORMAL HIGH (ref 0.7–4.0)
MCH: 30.3 pg (ref 26.0–34.0)
MCHC: 33 g/dL (ref 30.0–36.0)
MCV: 91.8 fL (ref 78.0–100.0)
Monocytes Absolute: 1.7 10*3/uL — ABNORMAL HIGH (ref 0.1–1.0)
Monocytes Relative: 15 % — ABNORMAL HIGH (ref 3–12)
Neutro Abs: 5.2 10*3/uL (ref 1.7–7.7)
Neutrophils Relative %: 45 % (ref 43–77)
Platelets: 208 10*3/uL (ref 150–400)
RBC: 4.76 MIL/uL (ref 4.22–5.81)
RDW: 13.9 % (ref 11.5–15.5)
WBC: 11.6 10*3/uL — ABNORMAL HIGH (ref 4.0–10.5)

## 2013-04-18 LAB — BASIC METABOLIC PANEL
BUN: 7 mg/dL (ref 6–23)
CO2: 24 mEq/L (ref 19–32)
Calcium: 8.8 mg/dL (ref 8.4–10.5)
Chloride: 101 mEq/L (ref 96–112)
Creatinine, Ser: 0.7 mg/dL (ref 0.50–1.35)
GFR calc Af Amer: >90 mL/min (ref >90)
GFR calc non Af Amer: >90 mL/min (ref >90)
Glucose, Bld: 81 mg/dL (ref 70–99)
Potassium: 4.1 mEq/L (ref 3.5–5.1)
Sodium: 135 mEq/L (ref 135–145)

## 2013-04-18 MED ORDER — DOCUSATE SODIUM 100 MG PO CAPS
100.0000 mg | ORAL_CAPSULE | Freq: Every day | ORAL | Status: DC
  Administered 2013-04-18 – 2013-04-20 (×3): 100 mg via ORAL
  Filled 2013-04-18 (×3): qty 1

## 2013-04-18 MED ORDER — OXYCODONE HCL 5 MG PO TABS
5.0000 mg | ORAL_TABLET | ORAL | Status: DC | PRN
  Administered 2013-04-19 – 2013-04-20 (×9): 5 mg via ORAL
  Filled 2013-04-18 (×9): qty 1

## 2013-04-18 MED ORDER — OXYCODONE-ACETAMINOPHEN 5-325 MG PO TABS
1.0000 | ORAL_TABLET | ORAL | Status: DC | PRN
  Administered 2013-04-18 (×2): 2 via ORAL
  Filled 2013-04-18 (×2): qty 2

## 2013-04-18 MED ORDER — OXYCODONE HCL 5 MG PO TABS
5.0000 mg | ORAL_TABLET | Freq: Once | ORAL | Status: AC
  Administered 2013-04-18: 5 mg via ORAL
  Filled 2013-04-18: qty 1

## 2013-04-18 NOTE — Progress Notes (Signed)
Attending Addendum  I examined the patient and discussed the assessment and plan with Dr. Lamar Benes. I have reviewed the note and agree.  The patient is improving daily. He reports that he does have persistent left upper quadrant pain but has only mild nausea and is able to tolerate clears. He is eager to try lunch today. He will be transitioned from IV to by mouth pain medicine today.    Timothy Martinez, Timothy Martinez

## 2013-04-18 NOTE — Progress Notes (Signed)
Family Medicine Teaching Service Daily Progress Note Intern Pager: [phone]  Patient name: Timothy Martinez Medical record number: [id number] Date of birth: [birth year] Age: 62 y.o. Gender: male  Primary Care Provider: [name], MD Consultants: none Code Status: Full  Pt Overview and Major Events to Date:   Assessment and Plan: Timothy Martinez is a 62 y.o. male presenting with abdominal pain x 2 days. PMH is significant for chronic alcoholic pancreatitis, COPD, DM, hyperlipidemia, GERD. Pain is epigastric and LUQ with radiation to back, does not appear to be an acute abdomen.   # Pancreatitis: has previous episodes of pancreatitis related to alcohol abuse. Denies current alcohol use. Lipase 577. WBC 12.9, Hgb 17.4, Cmet wnl. UA trace hgb. No imaging done in ED. Currently hemodynamically stable. Ranson score 1. - NS 150cc/hr  - CIWA protocol  - CBC/Bmet wnl yesterday - Dilaudid 1mg  q3hr, pt has been trying to space out the last 2 doses to q4hr - Tolerated jello/coffee, advancing diet this morning with breakfast - Transition pain meds to orals this afternoon  # COPD: no current complaints of SOB, not requiring oxygen.  - Albuterol mdi PRN   # DM: diet controlled, last A1c 6.8  - CBGs q4, hs  - Will give insulin if he develops hyperglycemia  - CBGs 75-100 last 24hrs  # GERD: - Protonix 40mg  daily  # HLD: - Restart atorvastatin when no longer NPO.  FEN/GI: NS 150cc/hr, NPO  Prophylaxis: lovenox  Disposition: pending clinical improvement, likely tomorrow  Subjective:  Patient feels better this morning, was able to tolerate jello last night and coffee this morning. Pain is relieved completely with dilaudid and continues to come back after 3 hours, but he has been trying to not request the shots until every 4 hours overnight. Pain is improved compared to yesterday for that last hour, now 7/10, still burning and epigastric/LUQ. Has not had a BM yet but is passing gas. He doesn't  feel ready to go home today but agrees to continue trying to space out the pain medications and possibly switching to oral pain meds.  Objective: Temp:  [98.1 F (36.7 C)-98.4 F (36.9 C)] 98.2 F (36.8 C) (09/25 0600) Pulse Rate:  [60-73] 73 (09/25 0600) Resp:  [18-20] 20 (09/25 0600) BP: (110-135)/(47-86) 110/47 mmHg (09/25 0600) SpO2:  [96 %-98 %] 98 % (09/25 0600) Physical Exam: General: NAD, lying in bed  HEENT: sclera anicteric, PERRL, EOMI, MMM  Cardiovascular: RRR, normal s1/s2, no murmurs  Respiratory: CTAB, effort normal  Abdomen: soft, TTP in epigastrium and LUQ, mild rebound. Bowel sounds present. Large horizontal well healed surgical scar below epigastric area.  Extremities: no leg edema, 2+ PT pulses bilaterally  Laboratory:  Recent Labs Lab 04/16/13 1832 04/17/13 0730  WBC 12.9* 12.1*  HGB 17.4* 15.3  HCT 48.1 45.9  PLT 234 226    Recent Labs Lab 04/11/13 0929 04/16/13 1832 04/17/13 0730  NA 139 140 136  K 4.5 4.0 4.0  CL 103 101 103  CO2 26 28 24   BUN 13 11 10   CREATININE 0.77 0.81 0.75  CALCIUM 9.5 9.4 8.8  PROT 8.0 8.5*  --   BILITOT 0.7 0.7  --   ALKPHOS 70 94  --   ALT 25 27  --   AST 25 30  --   GLUCOSE 124* 123* 120*   Lipase 577  Imaging/Diagnostic Tests: None  Tawanna Sat, MD 04/18/2013, 6:42 AM PGY-1, [place] Intern pager: [phone], text  pages welcome

## 2013-04-19 DIAGNOSIS — E1110 Type 2 diabetes mellitus with ketoacidosis without coma: Secondary | ICD-10-CM

## 2013-04-19 DIAGNOSIS — J449 Chronic obstructive pulmonary disease, unspecified: Secondary | ICD-10-CM

## 2013-04-19 DIAGNOSIS — K859 Acute pancreatitis without necrosis or infection, unspecified: Secondary | ICD-10-CM | POA: Diagnosis not present

## 2013-04-19 LAB — GLUCOSE, CAPILLARY
Glucose-Capillary: 105 mg/dL — ABNORMAL HIGH (ref 70–99)
Glucose-Capillary: 107 mg/dL — ABNORMAL HIGH (ref 70–99)
Glucose-Capillary: 103 mg/dL — ABNORMAL HIGH (ref 70–99)
Glucose-Capillary: 94 mg/dL (ref 70–99)
Glucose-Capillary: 130 mg/dL — ABNORMAL HIGH (ref 70–99)

## 2013-04-19 NOTE — Progress Notes (Signed)
Family Medicine Teaching Service Daily Progress Note Intern Pager: [phone]  Patient name: Timothy Martinez Medical record number: [id number] Date of birth: 1951-07-25 Age: 62 y.o. Gender: male  Primary Care Provider: [name], MD Consultants: none Code Status: Full  Pt Overview and Major Events to Date:   Assessment and Plan: Timothy Martinez is a 62 y.o. male presenting with abdominal pain x 2 days. PMH is significant for chronic alcoholic pancreatitis, COPD, DM, hyperlipidemia, GERD. Pain is epigastric and LUQ with radiation to back, does not appear to be an acute abdomen.   # Pancreatitis: has previous episodes of pancreatitis related to alcohol abuse. Denies current alcohol use. Lipase 577. WBC 12.9, Hgb 17.4, Cmet wnl. UA trace hgb. No imaging done in ED. Currently hemodynamically stable. Ranson score 1. - NS 150cc/hr  - CIWA protocol: scores have been 0 - Tolerated lunch/dinner well, increased pain with breakfast/coffee this morning - Transition to oral pain meds yesterday, received percocet initially and then switched to oxycodone IR overnight - Continue oxycodone IR q4hr PRN  # COPD: no current complaints of SOB, not requiring oxygen.  - Albuterol mdi PRN   # DM: diet controlled, last A1c 6.8  - CBGs q4, hs  - Will give insulin if he develops hyperglycemia  - CBGs 105-156 last 24hrs  # GERD: - Protonix 40mg  daily  # HLD: - Restart atorvastatin when no longer NPO.  FEN/GI: NS 150cc/hr, NPO  Prophylaxis: lovenox  Disposition: pending clinical improvement  Subjective:  Patient with increased pain this morning after eating half of his breakfast (eggs, coffee), pain is "burning". Percocet did not help with his pain yesterday, switched to oxycodone IR and this worked better, but still had increasing pain after 3rd hour. He was able to eat dinner without problems last night, no nausea/vomiting. No BM yesterday but still passing gas. OOB and walking around the floor  yesterday. Does not currently feel like he is ready to leave, with the current pain level he thinks he would come back.  Objective: Temp:  [97.8 F (36.6 C)-98 F (36.7 C)] 97.8 F (36.6 C) (09/26 0500) Pulse Rate:  [58-75] 75 (09/26 0500) Resp:  [16-20] 16 (09/26 0500) BP: (130-149)/(66-68) 130/68 mmHg (09/26 0500) SpO2:  [96 %-97 %] 96 % (09/26 0500) Physical Exam: General: NAD, lying in bed  HEENT: sclera anicteric, PERRL, EOMI, MMM  Cardiovascular: RRR, normal s1/s2, no murmurs  Respiratory: CTAB, effort normal  Abdomen: soft, TTP in epigastrium and LUQ, mild rebound. Bowel sounds present. Large horizontal well healed surgical scar below epigastric area.  Extremities: no leg edema, 2+ PT pulses bilaterally  Laboratory:  Recent Labs Lab 04/16/13 1832 04/17/13 0730 04/18/13 0840  WBC 12.9* 12.1* 11.6*  HGB 17.4* 15.3 14.4  HCT 48.1 45.9 43.7  PLT 234 226 208    Recent Labs Lab 04/16/13 1832 04/17/13 0730 04/18/13 0840  NA 140 136 135  K 4.0 4.0 4.1  CL 101 103 101  CO2 28 24 24   BUN 11 10 7   CREATININE 0.81 0.75 0.70  CALCIUM 9.4 8.8 8.8  PROT 8.5*  --   --   BILITOT 0.7  --   --   ALKPHOS 94  --   --   ALT 27  --   --   AST 30  --   --   GLUCOSE 123* 120* 81   Lipase 577  Imaging/Diagnostic Tests: None  Tawanna Sat, MD 04/19/2013, 10:01 AM PGY-1, [place] Intern  pager: [phone], text pages welcome

## 2013-04-19 NOTE — Progress Notes (Signed)
Attending Addendum  I examined the patient and discussed the assessment and plan with Dr. Lamar Benes. I have reviewed the note and agree.  The patient's pain is 6/10 now. No vomiting. Normal abdominal exam except mild diffuse b/l LQ tenderness. Plan to continue oxy IR for pain. Control will continue IVF but decrease to 75 m/l per hour as patient is tolerating liquids. Reduce diet to full liquid. Repeat CBC, BMP and lipase in AM.     Boykin Nearing, MD [place]

## 2013-04-19 NOTE — Progress Notes (Signed)
Family Medicine Teaching Service Daily Progress Note Intern Pager: [phone]  Patient name: Timothy Martinez Medical record number: [id number] Date of birth: 1950/10/31 Age: 62 y.o. Gender: male  Primary Care Provider: [name], MD Consultants: none Code Status: Full  Pt Overview and Major Events to Date:   Assessment and Plan: Timothy Martinez is a 62 y.o. male presenting with abdominal pain x 2 days. PMH is significant for chronic alcoholic pancreatitis, COPD, DM, hyperlipidemia, GERD. Pain is epigastric and LUQ with radiation to back, does not appear to be an acute abdomen.   # Pancreatitis: has previous episodes of pancreatitis related to alcohol abuse. Denies current alcohol use. Lipase 577. WBC 12.9, Hgb 17.4, Cmet wnl. UA trace hgb. No imaging done in ED. Currently hemodynamically stable. Ranson score 1. - NS 150cc/hr  - CIWA protocol: scores have been 0 - Tolerated lunch/dinner well, increased pain with breakfast/coffee this morning - Transition to oral pain meds yesterday, received percocet initially and then switched to oxycodone IR overnight  # COPD: no current complaints of SOB, not requiring oxygen.  - Albuterol mdi PRN   # DM: diet controlled, last A1c 6.8  - CBGs q4, hs  - Will give insulin if he develops hyperglycemia  - CBGs 105-156 last 24hrs  # GERD: - Protonix 40mg  daily  # HLD: - Restart atorvastatin when no longer NPO.  FEN/GI: NS 150cc/hr, NPO  Prophylaxis: lovenox  Disposition: pending clinical improvement  Subjective:  Patient with increased pain this morning after eating half of his breakfast (eggs, coffee), pain is "burning". Percocet did not help with his pain yesterday, switched to oxycodone IR and this worked better, but still had increasing pain after 3rd hour. He was able to eat dinner without problems last night, no nausea/vomiting. No BM yesterday but still passing gas. OOB and walking around the floor yesterday. Does not currently feel  like he is ready to leave, with the current pain level he thinks he would come back.  Objective: Temp:  [97.8 F (36.6 C)-98 F (36.7 C)] 97.8 F (36.6 C) (09/26 0500) Pulse Rate:  [58-75] 75 (09/26 0500) Resp:  [16-20] 16 (09/26 0500) BP: (130-149)/(66-68) 130/68 mmHg (09/26 0500) SpO2:  [96 %-97 %] 96 % (09/26 0500) Physical Exam: General: NAD, lying in bed  HEENT: sclera anicteric, PERRL, EOMI, MMM  Cardiovascular: RRR, normal s1/s2, no murmurs  Respiratory: CTAB, effort normal  Abdomen: soft, TTP in epigastrium and LUQ, mild rebound. Bowel sounds present. Large horizontal well healed surgical scar below epigastric area.  Extremities: no leg edema, 2+ PT pulses bilaterally  Laboratory:  Recent Labs Lab 04/16/13 1832 04/17/13 0730 04/18/13 0840  WBC 12.9* 12.1* 11.6*  HGB 17.4* 15.3 14.4  HCT 48.1 45.9 43.7  PLT 234 226 208    Recent Labs Lab 04/16/13 1832 04/17/13 0730 04/18/13 0840  NA 140 136 135  K 4.0 4.0 4.1  CL 101 103 101  CO2 28 24 24   BUN 11 10 7   CREATININE 0.81 0.75 0.70  CALCIUM 9.4 8.8 8.8  PROT 8.5*  --   --   BILITOT 0.7  --   --   ALKPHOS 94  --   --   ALT 27  --   --   AST 30  --   --   GLUCOSE 123* 120* 81   Lipase 577  Imaging/Diagnostic Tests: None  Tawanna Sat, MD 04/19/2013, 6:42 AM PGY-1, [place] Intern pager: [phone], text pages welcome

## 2013-04-19 NOTE — Progress Notes (Signed)
Attending Addendum  I examined the patient and discussed the assessment and plan with Dr. Lamar Benes. I have reviewed the note and agree.  The patient's pain is 6/10 now. No vomiting. Normal abdominal exam except mild diffuse b/l LQ tenderness. Plan to continue oxy IR for pain. Control will continue IVF but decrease to 75 m/l per hour as patient is tolerating liquids. Reduce diet to full liquid. Repeat CBC, BMP and lipase in AM.  Boykin Nearing, MD  [place]

## 2013-04-20 DIAGNOSIS — F17200 Nicotine dependence, unspecified, uncomplicated: Secondary | ICD-10-CM

## 2013-04-20 DIAGNOSIS — K859 Acute pancreatitis without necrosis or infection, unspecified: Secondary | ICD-10-CM | POA: Diagnosis not present

## 2013-04-20 DIAGNOSIS — E1110 Type 2 diabetes mellitus with ketoacidosis without coma: Secondary | ICD-10-CM | POA: Diagnosis not present

## 2013-04-20 LAB — BASIC METABOLIC PANEL
BUN: 8 mg/dL (ref 6–23)
CO2: 25 mEq/L (ref 19–32)
Calcium: 9.3 mg/dL (ref 8.4–10.5)
Chloride: 101 mEq/L (ref 96–112)
Creatinine, Ser: 0.75 mg/dL (ref 0.50–1.35)
GFR calc Af Amer: >90 mL/min (ref >90)
GFR calc non Af Amer: >90 mL/min (ref >90)
Glucose, Bld: 91 mg/dL (ref 70–99)
Potassium: 3.8 mEq/L (ref 3.5–5.1)
Sodium: 136 mEq/L (ref 135–145)

## 2013-04-20 LAB — CBC
HCT: 41.9 % (ref 39.0–52.0)
Hemoglobin: 14.9 g/dL (ref 13.0–17.0)
MCH: 32 pg (ref 26.0–34.0)
MCHC: 35.6 g/dL (ref 30.0–36.0)
MCV: 90.1 fL (ref 78.0–100.0)
Platelets: 244 10*3/uL (ref 150–400)
RBC: 4.65 MIL/uL (ref 4.22–5.81)
RDW: 13.8 % (ref 11.5–15.5)
WBC: 11.2 10*3/uL — ABNORMAL HIGH (ref 4.0–10.5)

## 2013-04-20 LAB — GLUCOSE, CAPILLARY
Glucose-Capillary: 110 mg/dL — ABNORMAL HIGH (ref 70–99)
Glucose-Capillary: 73 mg/dL (ref 70–99)
Glucose-Capillary: 84 mg/dL (ref 70–99)
Glucose-Capillary: 86 mg/dL (ref 70–99)

## 2013-04-20 LAB — LIPASE, BLOOD: Lipase: 160 U/L — ABNORMAL HIGH (ref 11–59)

## 2013-04-20 MED ORDER — OXYCODONE HCL 5 MG PO TABS: 5.0000 mg | ORAL_TABLET | ORAL | Status: DC | PRN

## 2013-04-20 NOTE — Progress Notes (Signed)
Family Medicine Teaching Service Daily Progress Note Intern Pager: [phone]  Patient name: Timothy Martinez Medical record number: [id number] Date of birth: 01/04/1951 Age: 62 y.o. Gender: male  Primary Care Provider: [name], MD Consultants: none Code Status: Full  Assessment and Plan: Timothy Martinez is a 62 y.o. male presenting with abdominal pain x 2 days. PMH is significant for chronic alcoholic pancreatitis, COPD, DM, hyperlipidemia, GERD. Pain is epigastric and LUQ with radiation to back, does not appear to be an acute abdomen.   # Pancreatitis: has previous episodes of pancreatitis related to alcohol abuse. Denies current alcohol use. Lipase 577. WBC 12.9, Hgb 17.4, Cmet wnl. UA trace hgb. No imaging done in ED. Currently hemodynamically stable. Ranson score 1. - NS 75 cc/hr, will dc as he is tolerating fluids easily - diet advanced yesterday, did not tolerate and so we went back to liquids which he is tolerating well so we will advance diet to carb modified today and hope for dc if he can tolerate it.  - CIWA protocol: scores have been 0 - Tolerated full liquids this am.  - Transitioned to oral pain meds, continue oxycodone IR q4hr PRN  # COPD: no current complaints of SOB, not requiring oxygen.  - Albuterol mdi PRN   # DM: diet controlled, last A1c 6.8  - CBGs q4, hs  - Will give insulin if he develops hyperglycemia  - CBGs 84-130 last 24hrs  # GERD: - Protonix 40mg  daily  # HLD: - Restart atorvastatin when no longer NPO.  FEN/GI: NS 150cc/hr, NPO  Prophylaxis: lovenox  Disposition: home when pain controlled with PO meds and tolerating regular diet.   Subjective:  Continued pain helped well by oxycodone. Tolerated full liquid this am.   Objective: Temp:  [97.6 F (36.4 C)-98.2 F (36.8 C)] 97.6 F (36.4 C) (09/27 0635) Pulse Rate:  [57-74] 60 (09/27 0635) Resp:  [18] 18 (09/27 0635) BP: (129-135)/(64-73) 129/73 mmHg (09/27 0635) SpO2:  [97 %-98 %] 97 %  (09/27 [id number]) Physical Exam: General: NAD, lying in bed  HEENT: sclera anicteric, PERRL, EOMI, MMM  Cardiovascular: RRR, normal s1/s2, no murmurs  Respiratory: CTAB, effort normal  Abdomen: soft, TTP in epigastrium and LUQ, mild tenderness of LLQ, Bowel sounds present. Large horizontal well healed surgical scar below epigastric area.  Extremities: no leg edema Neuro: A&O, up walking around easily.   Laboratory:  Recent Labs Lab 04/17/13 0730 04/18/13 0840 04/20/13 0500  WBC 12.1* 11.6* 11.2*  HGB 15.3 14.4 14.9  HCT 45.9 43.7 41.9  PLT 226 208 244    Recent Labs Lab 04/16/13 1832 04/17/13 0730 04/18/13 0840 04/20/13 0500  NA 140 136 135 136  K 4.0 4.0 4.1 3.8  CL 101 103 101 101  CO2 28 24 24 25   BUN 11 10 7 8   CREATININE 0.81 0.75 0.70 0.75  CALCIUM 9.4 8.8 8.8 9.3  PROT 8.5*  --   --   --   BILITOT 0.7  --   --   --   ALKPHOS 94  --   --   --   ALT 27  --   --   --   AST 30  --   --   --   GLUCOSE 123* 120* 81 91   Lipase 577-->160  Imaging/Diagnostic Tests: None  Timmothy Euler, MD 04/20/2013, 10:23 AM PGY-2, [place] Intern pager: [phone], text pages welcome

## 2013-04-20 NOTE — Discharge Summary (Signed)
[facility] Discharge Summary  Patient name: Timothy Martinez Medical record number: [id number] Date of birth: December 24, 1950 Age: 62 y.o. Gender: male Date of Admission: 04/16/2013  Date of Discharge: 04/20/2013 Admitting Physician: Alveda Reasons, MD  Primary Care Provider: [name], MD  Indication for Hospitalization: Acute pancreatitis Discharge Diagnoses:  1. Acute on chronic pancreatitis 2. COPD 3. DM2 4. Tobacco abuse 5. Hyperlipidemia 6. Chronic low back pain  Consultations: None  Significant Labs and Imaging:   Recent Labs Lab 04/16/13 1832  AST 30  ALT 27  ALKPHOS 94  BILITOT 0.7  PROT 8.5*  ALBUMIN 4.3    Recent Labs Lab 04/16/13 1832 04/17/13 0730 04/18/13 0840 04/20/13 0500  NA 140 136 135 136  K 4.0 4.0 4.1 3.8  CL 101 103 101 101  CO2 28 24 24 25   GLUCOSE 123* 120* 81 91  BUN 11 10 7 8   CREATININE 0.81 0.75 0.70 0.75  CALCIUM 9.4 8.8 8.8 9.3    Recent Labs Lab 04/17/13 0730 04/18/13 0840 04/20/13 0500  HGB 15.3 14.4 14.9  HCT 45.9 43.7 41.9  WBC 12.1* 11.6* 11.2*  PLT 226 208 244   Lipase 577-->160  Procedures: None  Brief Hospital Course:  Timothy Martinez is a 62 y.o. male presenting with abdominal pain x 2 days and an elevated lipase consistent with acute pancreatits. PMH is significant for chronic alcoholic pancreatitis, COPD, DM, hyperlipidemia, and GERD.   Pancreatitis:  His previous episodes of pancreatitis were related to alcohol abuse and he freely admitted it, this time he denies it and states that it started after eating spicy food. He was managed withy IV dilaudid, IVF, and kept NPO> His pain meds were de-escalated to 5 mg oxycodone IR q4 hours and his diet was slowly advanced to a regular diet. He was discharged when he was tolerating PO food well without pain that couldn't be controlled with PO pain meds. H is CIWA scores remained 0 throughout admission.   COPD: He had no complaints of SOB  during his admission and he was managed with PRN albuterol MDI.   DM2:  His DM2 is well controlled with only diet OP with his last A1C being 6.8. We monitored Q4 CBGs which stayed within acceptable limits not requiring insulin throughout admission.   GERD:  Managed with protonix, his H2 blocker was restarted on dc.   HLD:  His statin was held while IP and restarted on dc.   Discharge Medications:    Medication List         albuterol 108 (90 BASE) MCG/ACT inhaler  Commonly known as:  PROVENTIL HFA;VENTOLIN HFA  Inhale 2 puffs into the lungs every 4 (four) hours as needed for wheezing or shortness of breath. May have other brand than proair.     aspirin 81 MG tablet  Take 81 mg by mouth daily.     atorvastatin 40 MG tablet  Commonly known as:  LIPITOR  Take 1 tablet (40 mg total) by mouth daily.     multivitamins ther. w/minerals Tabs tablet  Take 1 tablet by mouth daily.     oxyCODONE 5 MG immediate release tablet  Commonly known as:  Oxy IR/ROXICODONE  Take 1 tablet (5 mg total) by mouth every 4 (four) hours as needed.     ranitidine 150 MG tablet  Commonly known as:  ZANTAC  Take 1 tablet (150 mg total) by mouth 2 (two) times daily.     thiamine  100 MG tablet  Commonly known as:  VITAMIN B-1  Take 100 mg by mouth daily.     traMADol 50 MG tablet  Commonly known as:  ULTRAM  Take 0.5 tablets (25 mg total) by mouth daily as needed for pain (low back pain).       Issues for Follow Up:  - Encourage to quit smoking, as this could be leading to his recurrent episodes - Monitor CBGs closely after acute pancreatitis  Outstanding Results: None  Discharge Instructions: Please refer to Patient Instructions section of EMR for full details.  Patient was counseled important signs and symptoms that should prompt return to medical care, changes in medications, dietary instructions, activity restrictions, and follow up appointments.       Follow-up Information   Schedule an  appointment as soon as possible for a visit with [name], MD.   Specialty:  Family Medicine   Contact information:   [address] [place] [address] [phone]       Discharge Condition: Margret Chance, MD 04/20/2013, 11:32 AM

## 2013-04-20 NOTE — Discharge Summary (Signed)
Attending Addendum  I examined the patient and discussed the discharge plan with Dr. Bridgett Larsson. I have reviewed the note and agree.    Timothy Martinez, [place]

## 2013-04-20 NOTE — Progress Notes (Signed)
Attending Addendum  I examined the patient and discussed the assessment and plan with Dr. Wendi Snipes. I have reviewed the note and agree.  The patient has not complaints this AM. Pain is 2/10.pain is worsened by eating.  No nausea. He is getting oxycontin. Has not required zofran. No BM yet.  Lipase down trending.   Abdomen: active BS. Soft. Mildly distended.   Feels ready to go home with PO pain medication. His brother lives 1 block away and will pick him up. He will advance his diet slowly.   Plan for discharge to home today.     Timothy Martinez, [place]

## 2013-04-25 ENCOUNTER — Encounter (HOSPITAL_COMMUNITY): Payer: Self-pay | Admitting: Emergency Medicine

## 2013-04-25 ENCOUNTER — Emergency Department (HOSPITAL_COMMUNITY): Payer: Medicare Other

## 2013-04-25 ENCOUNTER — Inpatient Hospital Stay (HOSPITAL_COMMUNITY)
Admission: EM | Admit: 2013-04-25 | Discharge: 2013-04-30 | DRG: 440 | Disposition: A | Discharge status: Home / Self Care | Payer: Medicare Other | Attending: Family Medicine | Admitting: Family Medicine

## 2013-04-25 DIAGNOSIS — R109 Unspecified abdominal pain: Secondary | ICD-10-CM | POA: Diagnosis present

## 2013-04-25 DIAGNOSIS — J449 Chronic obstructive pulmonary disease, unspecified: Secondary | ICD-10-CM | POA: Diagnosis present

## 2013-04-25 DIAGNOSIS — K860 Alcohol-induced chronic pancreatitis: Secondary | ICD-10-CM | POA: Diagnosis present

## 2013-04-25 DIAGNOSIS — K861 Other chronic pancreatitis: Secondary | ICD-10-CM | POA: Diagnosis present

## 2013-04-25 DIAGNOSIS — E440 Moderate protein-calorie malnutrition: Secondary | ICD-10-CM | POA: Diagnosis present

## 2013-04-25 DIAGNOSIS — E1110 Type 2 diabetes mellitus with ketoacidosis without coma: Secondary | ICD-10-CM

## 2013-04-25 DIAGNOSIS — F1010 Alcohol abuse, uncomplicated: Secondary | ICD-10-CM | POA: Diagnosis present

## 2013-04-25 DIAGNOSIS — E785 Hyperlipidemia, unspecified: Secondary | ICD-10-CM

## 2013-04-25 DIAGNOSIS — J4489 Other specified chronic obstructive pulmonary disease: Secondary | ICD-10-CM | POA: Diagnosis present

## 2013-04-25 DIAGNOSIS — E119 Type 2 diabetes mellitus without complications: Secondary | ICD-10-CM

## 2013-04-25 DIAGNOSIS — K219 Gastro-esophageal reflux disease without esophagitis: Secondary | ICD-10-CM | POA: Diagnosis present

## 2013-04-25 DIAGNOSIS — F17200 Nicotine dependence, unspecified, uncomplicated: Secondary | ICD-10-CM | POA: Diagnosis present

## 2013-04-25 DIAGNOSIS — Z720 Tobacco use: Secondary | ICD-10-CM

## 2013-04-25 DIAGNOSIS — K859 Acute pancreatitis without necrosis or infection, unspecified: Principal | ICD-10-CM | POA: Diagnosis present

## 2013-04-25 DIAGNOSIS — K5900 Constipation, unspecified: Secondary | ICD-10-CM | POA: Diagnosis not present

## 2013-04-25 LAB — COMPREHENSIVE METABOLIC PANEL
ALT: 142 U/L — ABNORMAL HIGH (ref 0–53)
AST: 71 U/L — ABNORMAL HIGH (ref 0–37)
Albumin: 3.9 g/dL (ref 3.5–5.2)
Alkaline Phosphatase: 244 U/L — ABNORMAL HIGH (ref 39–117)
BUN: 10 mg/dL (ref 6–23)
CO2: 27 mEq/L (ref 19–32)
Calcium: 9.7 mg/dL (ref 8.4–10.5)
Chloride: 103 mEq/L (ref 96–112)
Creatinine, Ser: 0.78 mg/dL (ref 0.50–1.35)
GFR calc Af Amer: >90 mL/min (ref >90)
GFR calc non Af Amer: >90 mL/min (ref >90)
Glucose, Bld: 233 mg/dL — ABNORMAL HIGH (ref 70–99)
Potassium: 4 mEq/L (ref 3.5–5.1)
Sodium: 141 mEq/L (ref 135–145)
Total Bilirubin: 1.2 mg/dL (ref 0.3–1.2)
Total Protein: 8.7 g/dL — ABNORMAL HIGH (ref 6.0–8.3)

## 2013-04-25 LAB — CBC WITH DIFFERENTIAL/PLATELET
Basophils Absolute: 0.1 10*3/uL (ref 0.0–0.1)
Basophils Relative: 1 % (ref 0–1)
Eosinophils Absolute: 0.3 10*3/uL (ref 0.0–0.7)
Eosinophils Relative: 3 % (ref 0–5)
HCT: 45.4 % (ref 39.0–52.0)
Hemoglobin: 16.3 g/dL (ref 13.0–17.0)
Lymphocytes Relative: 41 % (ref 12–46)
Lymphs Abs: 3.9 10*3/uL (ref 0.7–4.0)
MCH: 32.3 pg (ref 26.0–34.0)
MCHC: 35.9 g/dL (ref 30.0–36.0)
MCV: 89.9 fL (ref 78.0–100.0)
Monocytes Absolute: 1.4 10*3/uL — ABNORMAL HIGH (ref 0.1–1.0)
Monocytes Relative: 15 % — ABNORMAL HIGH (ref 3–12)
Neutro Abs: 3.9 10*3/uL (ref 1.7–7.7)
Neutrophils Relative %: 41 % — ABNORMAL LOW (ref 43–77)
Platelets: 283 10*3/uL (ref 150–400)
RBC: 5.05 MIL/uL (ref 4.22–5.81)
RDW: 13.7 % (ref 11.5–15.5)
WBC: 9.6 10*3/uL (ref 4.0–10.5)

## 2013-04-25 LAB — URINE MICROSCOPIC-ADD ON

## 2013-04-25 LAB — URINALYSIS, ROUTINE W REFLEX MICROSCOPIC
Glucose, UA: >1000 mg/dL — AB
Hgb urine dipstick: NEGATIVE
Ketones, ur: 15 mg/dL — AB
Leukocytes, UA: NEGATIVE
Nitrite: NEGATIVE
Protein, ur: NEGATIVE mg/dL
Specific Gravity, Urine: 1.037 — ABNORMAL HIGH (ref 1.005–1.030)
Urobilinogen, UA: 1 mg/dL (ref 0.0–1.0)
pH: 6 (ref 5.0–8.0)

## 2013-04-25 LAB — LIPASE, BLOOD: Lipase: 665 U/L — ABNORMAL HIGH (ref 11–59)

## 2013-04-25 LAB — OCCULT BLOOD, POC DEVICE: Fecal Occult Bld: NEGATIVE

## 2013-04-25 MED ORDER — HYDROMORPHONE HCL PF 1 MG/ML IJ SOLN
1.0000 mg | Freq: Once | INTRAMUSCULAR | Status: AC
  Administered 2013-04-25: 1 mg via INTRAVENOUS
  Filled 2013-04-25: qty 1

## 2013-04-25 MED ORDER — IOHEXOL 300 MG/ML  SOLN
100.0000 mL | Freq: Once | INTRAMUSCULAR | Status: AC | PRN
  Administered 2013-04-25: 100 mL via INTRAVENOUS

## 2013-04-25 NOTE — ED Provider Notes (Signed)
CSN: [id number]     Arrival date & time 04/25/13  1815 History   First MD Initiated Contact with Patient 04/25/13 2011     Chief Complaint  Patient presents with  . Constipation   (Consider location/radiation/quality/duration/timing/severity/associated sxs/prior Treatment) Patient is a 62 y.o. male presenting with abdominal pain.  Abdominal Pain Pain location:  Generalized Pain quality: aching and sharp   Pain radiates to:  Does not radiate Pain severity:  Severe Onset quality:  Gradual Duration:  5 days Timing:  Constant Progression:  Worsening Chronicity:  Chronic Context: recent illness   Context: not alcohol use   Relieved by:  None tried Worsened by:  Palpation, eating and movement Ineffective treatments:  None tried Associated symptoms: anorexia and nausea   Associated symptoms: no chest pain, no chills, no constipation, no cough, no diarrhea, no dysuria, no fever, no hematemesis, no hematochezia, no hematuria, no shortness of breath, no sore throat and no vomiting     Past Medical History  Diagnosis Date  . Pancreas disorder   . Diabetes mellitus   . Hyperlipidemia   . Shortness of breath     with ambulation  . Anginal pain   . Pneumonia   . GERD (gastroesophageal reflux disease)   . Headache(784.0)   . Arthritis    Past Surgical History  Procedure Laterality Date  . Back surgery    . Neck surgery      not cervical, states had lesion on neck which was removed  . Splenectomy     Family History  Problem Relation Age of Onset  . Diabetes Mother   . Aneurysm Mother   . Alcohol abuse Father    History  Substance Use Topics  . Smoking status: Current Every Day Smoker -- 1.00 packs/day for 45 years    Types: Cigarettes    Start date: 04/09/1964  . Smokeless tobacco: Never Used     Comment: trying to quit. smoked 3 ppd before  . Alcohol Use: 0.0 oz/week    6-7 Cans of beer per week     Comment: quit  05/28/11    Review of Systems  Constitutional:  Negative for fever and chills.  HENT: Negative for congestion, sore throat and rhinorrhea.   Eyes: Negative for photophobia and visual disturbance.  Respiratory: Negative for cough and shortness of breath.   Cardiovascular: Negative for chest pain and leg swelling.  Gastrointestinal: Positive for nausea, abdominal pain and anorexia. Negative for vomiting, diarrhea, constipation, hematochezia and hematemesis.  Endocrine: Negative for polydipsia and polyuria.  Genitourinary: Negative for dysuria and hematuria.  Musculoskeletal: Negative for back pain and arthralgias.  Skin: Negative for color change and rash.  Neurological: Negative for dizziness, syncope, light-headedness and headaches.  Hematological: Negative for adenopathy. Does not bruise/bleed easily.  All other systems reviewed and are negative.    Allergies  Morphine and related and Penicillins  Home Medications   Current Outpatient Rx  Name  Route  Sig  Dispense  Refill  . albuterol (PROVENTIL HFA;VENTOLIN HFA) 108 (90 BASE) MCG/ACT inhaler   Inhalation   Inhale 2 puffs into the lungs every 4 (four) hours as needed for wheezing or shortness of breath. May have other brand than proair.   1 Inhaler   3     May give different brand than proair per patient p ...   . aspirin 81 MG tablet   Oral   Take 81 mg by mouth daily.         Marland Kitchen  atorvastatin (LIPITOR) 40 MG tablet   Oral   Take 1 tablet (40 mg total) by mouth daily.   30 tablet   11   . Multiple Vitamins-Minerals (MULTIVITAMINS THER. W/MINERALS) TABS   Oral   Take 1 tablet by mouth daily.         Marland Kitchen oxyCODONE (OXY IR/ROXICODONE) 5 MG immediate release tablet   Oral   Take 1 tablet (5 mg total) by mouth every 4 (four) hours as needed.   84 tablet   0   . ranitidine (ZANTAC) 150 MG tablet   Oral   Take 1 tablet (150 mg total) by mouth 2 (two) times daily.   60 tablet   11   . thiamine (VITAMIN B-1) 100 MG tablet   Oral   Take 100 mg by mouth daily.          . traMADol (ULTRAM) 50 MG tablet   Oral   Take 0.5 tablets (25 mg total) by mouth daily as needed for pain (low back pain).   45 tablet   0    BP 122/104  Pulse 75  Temp(Src) 98.2 F (36.8 C) (Oral)  Resp 14  SpO2 96% Physical Exam  Vitals reviewed. Constitutional: He is oriented to person, place, and time. He appears well-developed and well-nourished.  HENT:  Head: Normocephalic and atraumatic.  Eyes: Conjunctivae and EOM are normal.  Neck: Normal range of motion. Neck supple.  Cardiovascular: Normal rate, regular rhythm and normal heart sounds.   Pulmonary/Chest: Effort normal and breath sounds normal. No respiratory distress.  Abdominal: He exhibits no distension. There is generalized tenderness. There is no rebound and no guarding.  Genitourinary: Rectal exam shows internal hemorrhoid. Guaiac negative stool.  Minimal stool in rectal vault  Musculoskeletal: Normal range of motion.  Neurological: He is alert and oriented to person, place, and time.  Skin: Skin is warm and dry.    ED Course  Procedures (including critical care time) Labs Review Labs Reviewed  CBC WITH DIFFERENTIAL - Abnormal; Notable for the following:    Neutrophils Relative % 41 (*)    Monocytes Relative 15 (*)    Monocytes Absolute 1.4 (*)    All other components within normal limits  COMPREHENSIVE METABOLIC PANEL - Abnormal; Notable for the following:    Glucose, Bld 233 (*)    Total Protein 8.7 (*)    AST 71 (*)    ALT 142 (*)    Alkaline Phosphatase 244 (*)    All other components within normal limits  LIPASE, BLOOD - Abnormal; Notable for the following:    Lipase 665 (*)    All other components within normal limits  URINALYSIS, ROUTINE W REFLEX MICROSCOPIC - Abnormal; Notable for the following:    Color, Urine AMBER (*)    Specific Gravity, Urine 1.037 (*)    Glucose, UA >1000 (*)    Bilirubin Urine MODERATE (*)    Ketones, ur 15 (*)    All other components within normal limits   URINE MICROSCOPIC-ADD ON - Abnormal; Notable for the following:    Casts HYALINE CASTS (*)    Crystals CA OXALATE CRYSTALS (*)    All other components within normal limits  OCCULT BLOOD, POC DEVICE   Imaging Review Ct Abdomen Pelvis W Contrast  04/25/2013   CLINICAL DATA:  Constipation, abdominal pain, nausea.  EXAM: CT ABDOMEN AND PELVIS WITH CONTRAST  TECHNIQUE: Multidetector CT imaging of the abdomen and pelvis was performed using the standard protocol following  bolus administration of intravenous contrast.  CONTRAST:  168mL OMNIPAQUE IOHEXOL 300 MG/ML  SOLN  COMPARISON:  02/20/2007. Ultrasound 02/26/2012.  FINDINGS: Areas of reticulation and possible early fibrosis in the peripheral bases. 7 mm nodule in the right middle lobe, not imaged on prior study. Other smaller adjacent right middle lobe nodules. 5 mm nodule in the lingula on image 2. No pleural effusions. Heart is normal size.  Gallbladder is mildly distended, similar to prior study. Slight intrahepatic biliary ductal dilatation. No focal hepatic abnormality. Prior splenectomy.  The pancreatic duct is dilated, measuring 11 mm maximally. No visible pancreatic mass. This presumably represents to prior bouts of pancreatitis. Adrenals and kidneys are unremarkable. Small bowel is decompressed. Colon is grossly unremarkable. No free fluid, free air or adenopathy. Urinary bladder and prostate grossly unremarkable.  Aorta and iliac vessels are calcified, non aneurysmal.  No acute bony abnormality.  IMPRESSION: Mild gallbladder distention without visible stones or wall thickening. Mild intrahepatic biliary ductal dilatation.  Pancreatic duct dilatation, presumably related to prior bouts of pancreatitis. No visible pancreatic mass lesion.  No acute findings in the abdomen or pelvis.  Scattered lower lungs pulmonary nodules. The largest measures 7 mm. If the patient is at high risk for bronchogenic carcinoma, follow-up chest CT at 3-47months is recommended.  If the patient is at low risk for bronchogenic carcinoma, follow-up chest CT at 6-12 months is recommended. This recommendation follows the consensus statement: Guidelines for Management of Small Pulmonary Nodules Detected on CT Scans: A Statement from the [place] as published in Radiology 2005; 237:395-400.   Electronically Signed   By: Rolm Baptise M.D.   On: 04/25/2013 23:58    MDM   1. Pancreatitis    62 y.o. male  with pertinent PMH of chronic pancreatitis presumed secondary to etoh abuse presents with persistent abd pain, nausea and vomiting after recent dc for pancreatitis flare.  Since that time pt has had increased nausea and abd pain.  Physical exam as above with diffuse abd tenderness, labs as above with elevated lipase, mild transaminitis.  CT scan demonstrated pancreatic ductal dilatation.  Pt afebrile without acute findings otherwise to explain condition. Consulted family medicine and pt admitted.    Labs and imaging as above reviewed by myself and attending,Dr. Dina Rich, with whom case was discussed.   1. Pancreatitis         Rexene Agent, MD 04/26/13 [phone]

## 2013-04-25 NOTE — ED Notes (Signed)
Pt. reports constipation for 5 days unrelieved by OTC laxative with left abdominal cramping and slight nausea . Pt. Stated he was recently discharged here diagnosed with pancreatitis.

## 2013-04-26 ENCOUNTER — Encounter (HOSPITAL_COMMUNITY): Payer: Self-pay | Admitting: *Deleted

## 2013-04-26 DIAGNOSIS — R109 Unspecified abdominal pain: Secondary | ICD-10-CM

## 2013-04-26 DIAGNOSIS — K859 Acute pancreatitis without necrosis or infection, unspecified: Principal | ICD-10-CM

## 2013-04-26 DIAGNOSIS — E440 Moderate protein-calorie malnutrition: Secondary | ICD-10-CM | POA: Diagnosis present

## 2013-04-26 LAB — LIPID PANEL
Cholesterol: 137 mg/dL (ref 0–200)
HDL: 28 mg/dL — ABNORMAL LOW (ref >39)
LDL Cholesterol: 92 mg/dL (ref 0–99)
Total CHOL/HDL Ratio: 4.9 RATIO
Triglycerides: 87 mg/dL (ref <150)
VLDL: 17 mg/dL (ref 0–40)

## 2013-04-26 LAB — ETHANOL: Alcohol, Ethyl (B): <11 mg/dL (ref 0–11)

## 2013-04-26 MED ORDER — ACETAMINOPHEN 650 MG RE SUPP: 650.0000 mg | Freq: Four times a day (QID) | RECTAL | Status: DC | PRN

## 2013-04-26 MED ORDER — SODIUM CHLORIDE 0.9 % IV SOLN
INTRAVENOUS | Status: DC
  Administered 2013-04-26 – 2013-04-28 (×11): via INTRAVENOUS

## 2013-04-26 MED ORDER — ONDANSETRON HCL 4 MG/2ML IJ SOLN
4.0000 mg | Freq: Four times a day (QID) | INTRAMUSCULAR | Status: DC | PRN
  Administered 2013-04-26 – 2013-04-28 (×2): 4 mg via INTRAVENOUS
  Filled 2013-04-26 (×2): qty 2

## 2013-04-26 MED ORDER — HYDROMORPHONE HCL PF 1 MG/ML IJ SOLN
1.0000 mg | INTRAMUSCULAR | Status: DC | PRN
  Administered 2013-04-26 – 2013-04-27 (×6): 1 mg via INTRAVENOUS
  Filled 2013-04-26 (×6): qty 1

## 2013-04-26 MED ORDER — HEPARIN SODIUM (PORCINE) 5000 UNIT/ML IJ SOLN
5000.0000 [IU] | Freq: Three times a day (TID) | INTRAMUSCULAR | Status: DC
  Administered 2013-04-26 – 2013-04-30 (×12): 5000 [IU] via SUBCUTANEOUS
  Filled 2013-04-26 (×16): qty 1

## 2013-04-26 MED ORDER — ASPIRIN 81 MG PO CHEW
81.0000 mg | CHEWABLE_TABLET | Freq: Every day | ORAL | Status: DC
  Administered 2013-04-26 – 2013-04-30 (×5): 81 mg via ORAL
  Filled 2013-04-26 (×5): qty 1

## 2013-04-26 MED ORDER — ACETAMINOPHEN 325 MG PO TABS
650.0000 mg | ORAL_TABLET | Freq: Four times a day (QID) | ORAL | Status: DC | PRN
Start: 2013-04-26 — End: 2013-04-30

## 2013-04-26 MED ORDER — SENNA 8.6 MG PO TABS
1.0000 | ORAL_TABLET | Freq: Two times a day (BID) | ORAL | Status: DC
  Administered 2013-04-26 – 2013-04-30 (×9): 8.6 mg via ORAL
  Filled 2013-04-26 (×11): qty 1

## 2013-04-26 MED ORDER — ALBUTEROL SULFATE HFA 108 (90 BASE) MCG/ACT IN AERS: 2.0000 | INHALATION_SPRAY | RESPIRATORY_TRACT | Status: DC | PRN

## 2013-04-26 MED ORDER — ASPIRIN 81 MG PO TABS: 81.0000 mg | ORAL_TABLET | Freq: Every day | ORAL | Status: DC

## 2013-04-26 MED ORDER — ATORVASTATIN CALCIUM 40 MG PO TABS
40.0000 mg | ORAL_TABLET | Freq: Every day | ORAL | Status: DC
  Administered 2013-04-26 – 2013-04-30 (×5): 40 mg via ORAL
  Filled 2013-04-26 (×5): qty 1

## 2013-04-26 MED ORDER — POLYETHYLENE GLYCOL 3350 17 G PO PACK
17.0000 g | PACK | Freq: Every day | ORAL | Status: DC | PRN
  Filled 2013-04-26: qty 1

## 2013-04-26 MED ORDER — ONDANSETRON HCL 4 MG PO TABS: 4.0000 mg | ORAL_TABLET | Freq: Four times a day (QID) | ORAL | Status: DC | PRN

## 2013-04-26 NOTE — ED Provider Notes (Signed)
I saw and evaluated the patient, reviewed the resident's note and I agree with the findings and plan.  Patient with recent admission for pancreatitis presents with continued pain.  Labs with mild LFT elevation and lipase >600 which is greater than 3x lipase at discharge.  Patient given pain and nausea medication.  Persistent symptoms.  CT scan shows intrahepatic ductal dilation as well as pancreatic ductal dilation.  Patient will be admitted back to [facility] service.  Merryl Hacker, MD 04/26/13 [phone]

## 2013-04-26 NOTE — H&P (Signed)
[facility] Admission History and Physical Service Pager: [phone]  Patient name: Timothy Martinez Medical record number: [id number] Date of birth: Jul 31, 1950 Age: 62 y.o. Gender: male  Primary Care Provider: [name], MD Consultants: GI Code Status: Full  Chief Complaint: abdominal pain  Assessment and Plan: Timothy Martinez is a 62 y.o. male presenting with recurrent pancreatitis. PMH is significant for DM, COPD, GERD, HLD, Tobacco abuse, ETOH abuse  # Pancreatitis: Recurrent episode. Recently discharged on 9/27 after treatment for w/ pancreatitis. Etiology unclear but may be related to ETOH, Hyper tri., or biliary stones. Most likely due to returning to high fat/protein diet too soon. Denies ETOH use. H/o DM (Gluc 233 on admission) and hypertrigliceridemia. CT w/ mild intrahepatic dilitation and pancreatic dilitation, but no stone noted. No evidence of pseudocyst or necrosis - Admit to Med-Surge - NPO,  - NS 140ml/hr - dilaudid 1mg  Q4 PRN.  (swelling w/ morphine) - CIWA - Lipid panel in am - continue home lipitor 40mg  - GI consult to see if they would like to perform ERCP or other test.   # DM: Diet controlled. Last A1c 6.5.  - Will Add SSI in case becomes hyperglycemic, though unlikely  # COPD: Carried Dx of mild to mod. No evidence of exacerbation. No home O2. No requirement on admission - monitor - cont home albuterol PRN  # Tobacco abuse: 1ppd - Nicotine patch  FEN/GI: NPO as above. On H2 blocker at home. ONe BM in past week - Protonix - zofran PRN - senna, miralax and soap suds enema for constipation  Prophylaxis: Hep [place] TID - ASA 81  Disposition: pending improvemnt  History of Present Illness: Timothy Martinez is a 62 y.o. male presenting with abdominal pain. Pt recently DC from our service on 9/27 for pancreatitis. Pt felt he left to soon due to not having a BM prior to DC. Current episode started on Monday morning. Ate soup, mac  salad, chicken, green beans, and potatoes. Pain is stinging located in the lower epigastric region w/ radiation to the L. BM on Sunday. Improved w/ rest. Worse w/ laying on side. Last meal Thu including sasauge biscuit w/ gravy and tea. No ETOH since discharge.   Review Of Systems: Per HPI with the following additions: none Otherwise 12 point review of systems was performed and was unremarkable.  Patient Active Problem List   Diagnosis Date Noted  . Pancreatitis, acute 04/17/2013  . Diabetes mellitus 04/17/2013  . Low back pain 12/21/2012  . Abdominal pain, unspecified site 09/28/2012  . Chest pain 08/22/2012  . Polyuria 08/21/2012  . COPD (chronic obstructive pulmonary disease) 04/27/2012  . Overweight (BMI 25.0-29.9) 03/27/2012  . GERD (gastroesophageal reflux disease) 03/05/2012  . Alcohol abuse 06/09/2011  . Diabetes Mellitus Type II 05/30/2011  . Chronic alcoholic pancreatitis [id number]  . Hyperlipidemia 05/30/2011  . Tobacco abuse 05/30/2011   Past Medical History: Past Medical History  Diagnosis Date  . Pancreas disorder   . Diabetes mellitus   . Hyperlipidemia   . Shortness of breath     with ambulation  . Anginal pain   . Pneumonia   . GERD (gastroesophageal reflux disease)   . Headache(784.0)   . Arthritis    Past Surgical History: Past Surgical History  Procedure Laterality Date  . Back surgery    . Neck surgery      not cervical, states had lesion on neck which was removed  . Splenectomy     Social  History: History  Substance Use Topics  . Smoking status: Current Every Day Smoker -- 1.00 packs/day for 45 years    Types: Cigarettes    Start date: 04/09/1964  . Smokeless tobacco: Never Used     Comment: trying to quit. smoked 3 ppd before  . Alcohol Use: 0.0 oz/week    6-7 Cans of beer per week     Comment: quit  05/28/11   Additional social history: none  Please also refer to relevant sections of EMR.  Family History: Family History  Problem  Relation Age of Onset  . Diabetes Mother   . Aneurysm Mother   . Alcohol abuse Father    Allergies and Medications: Allergies  Allergen Reactions  . Morphine And Related Swelling    Pt states he can tolerate oxycodone and hydromorphone  . Penicillins Rash   No current facility-administered medications on file prior to encounter.   Current Outpatient Prescriptions on File Prior to Encounter  Medication Sig Dispense Refill  . albuterol (PROVENTIL HFA;VENTOLIN HFA) 108 (90 BASE) MCG/ACT inhaler Inhale 2 puffs into the lungs every 4 (four) hours as needed for wheezing or shortness of breath. May have other brand than proair.  1 Inhaler  3  . aspirin 81 MG tablet Take 81 mg by mouth daily.      Marland Kitchen atorvastatin (LIPITOR) 40 MG tablet Take 1 tablet (40 mg total) by mouth daily.  30 tablet  11  . Multiple Vitamins-Minerals (MULTIVITAMINS THER. W/MINERALS) TABS Take 1 tablet by mouth daily.      Marland Kitchen oxyCODONE (OXY IR/ROXICODONE) 5 MG immediate release tablet Take 1 tablet (5 mg total) by mouth every 4 (four) hours as needed.  84 tablet  0  . ranitidine (ZANTAC) 150 MG tablet Take 1 tablet (150 mg total) by mouth 2 (two) times daily.  60 tablet  11  . thiamine (VITAMIN B-1) 100 MG tablet Take 100 mg by mouth daily.      . traMADol (ULTRAM) 50 MG tablet Take 0.5 tablets (25 mg total) by mouth daily as needed for pain (low back pain).  45 tablet  0    Objective: BP 122/104  Pulse 75  Temp(Src) 98.2 F (36.8 C) (Oral)  Resp 14  SpO2 96% Exam: General: mild distress HEENT: EOMI, dry mmm Cardiovascular: RRR, no m/r/g 2+ pulses Respiratory: CTAB: nml effort Abdomen: Hypoactive BS, mild TTP along L epigastric region Extremities: no edema Skin: warm, intact Neuro: CN grossly intact  Labs and Imaging: Results for orders placed during the hospital encounter of 04/25/13 (from the past 24 hour(s))  CBC WITH DIFFERENTIAL     Status: Abnormal   Collection Time    04/25/13  6:46 PM      Result  Value Range   WBC 9.6  4.0 - 10.5 K/uL   RBC 5.05  4.22 - 5.81 MIL/uL   Hemoglobin 16.3  13.0 - 17.0 g/dL   HCT 45.4  39.0 - 52.0 %   MCV 89.9  78.0 - 100.0 fL   MCH 32.3  26.0 - 34.0 pg   MCHC 35.9  30.0 - 36.0 g/dL   RDW 13.7  11.5 - 15.5 %   Platelets 283  150 - 400 K/uL   Neutrophils Relative % 41 (*) 43 - 77 %   Neutro Abs 3.9  1.7 - 7.7 K/uL   Lymphocytes Relative 41  12 - 46 %   Lymphs Abs 3.9  0.7 - 4.0 K/uL   Monocytes Relative 15 (*)  3 - 12 %   Monocytes Absolute 1.4 (*) 0.1 - 1.0 K/uL   Eosinophils Relative 3  0 - 5 %   Eosinophils Absolute 0.3  0.0 - 0.7 K/uL   Basophils Relative 1  0 - 1 %   Basophils Absolute 0.1  0.0 - 0.1 K/uL  COMPREHENSIVE METABOLIC PANEL     Status: Abnormal   Collection Time    04/25/13  6:46 PM      Result Value Range   Sodium 141  135 - 145 mEq/L   Potassium 4.0  3.5 - 5.1 mEq/L   Chloride 103  96 - 112 mEq/L   CO2 27  19 - 32 mEq/L   Glucose, Bld 233 (*) 70 - 99 mg/dL   BUN 10  6 - 23 mg/dL   Creatinine, Ser 0.78  0.50 - 1.35 mg/dL   Calcium 9.7  8.4 - 10.5 mg/dL   Total Protein 8.7 (*) 6.0 - 8.3 g/dL   Albumin 3.9  3.5 - 5.2 g/dL   AST 71 (*) 0 - 37 U/L   ALT 142 (*) 0 - 53 U/L   Alkaline Phosphatase 244 (*) 39 - 117 U/L   Total Bilirubin 1.2  0.3 - 1.2 mg/dL   GFR calc non Af Amer >90  >90 mL/min   GFR calc Af Amer >90  >90 mL/min  LIPASE, BLOOD     Status: Abnormal   Collection Time    04/25/13  6:46 PM      Result Value Range   Lipase 665 (*) 11 - 59 U/L  URINALYSIS, ROUTINE W REFLEX MICROSCOPIC     Status: Abnormal   Collection Time    04/25/13  6:55 PM      Result Value Range   Color, Urine AMBER (*) YELLOW   APPearance CLEAR  CLEAR   Specific Gravity, Urine 1.037 (*) 1.005 - 1.030   pH 6.0  5.0 - 8.0   Glucose, UA >1000 (*) NEGATIVE mg/dL   Hgb urine dipstick NEGATIVE  NEGATIVE   Bilirubin Urine MODERATE (*) NEGATIVE   Ketones, ur 15 (*) NEGATIVE mg/dL   Protein, ur NEGATIVE  NEGATIVE mg/dL   Urobilinogen, UA 1.0   0.0 - 1.0 mg/dL   Nitrite NEGATIVE  NEGATIVE   Leukocytes, UA NEGATIVE  NEGATIVE  URINE MICROSCOPIC-ADD ON     Status: Abnormal   Collection Time    04/25/13  6:55 PM      Result Value Range   Casts HYALINE CASTS (*) NEGATIVE   Crystals CA OXALATE CRYSTALS (*) NEGATIVE   Urine-Other MUCOUS PRESENT    OCCULT BLOOD, POC DEVICE     Status: None   Collection Time    04/25/13 10:31 PM      Result Value Range   Fecal Occult Bld NEGATIVE  NEGATIVE   Ct Abdomen Pelvis W Contrast  04/25/2013   CLINICAL DATA:  Constipation, abdominal pain, nausea.  EXAM: CT ABDOMEN AND PELVIS WITH CONTRAST  TECHNIQUE: Multidetector CT imaging of the abdomen and pelvis was performed using the standard protocol following bolus administration of intravenous contrast.  CONTRAST:  141mL OMNIPAQUE IOHEXOL 300 MG/ML  SOLN  COMPARISON:  02/20/2007. Ultrasound 02/26/2012.  FINDINGS: Areas of reticulation and possible early fibrosis in the peripheral bases. 7 mm nodule in the right middle lobe, not imaged on prior study. Other smaller adjacent right middle lobe nodules. 5 mm nodule in the lingula on image 2. No pleural effusions. Heart is normal size.  Gallbladder  is mildly distended, similar to prior study. Slight intrahepatic biliary ductal dilatation. No focal hepatic abnormality. Prior splenectomy.  The pancreatic duct is dilated, measuring 11 mm maximally. No visible pancreatic mass. This presumably represents to prior bouts of pancreatitis. Adrenals and kidneys are unremarkable. Small bowel is decompressed. Colon is grossly unremarkable. No free fluid, free air or adenopathy. Urinary bladder and prostate grossly unremarkable.  Aorta and iliac vessels are calcified, non aneurysmal.  No acute bony abnormality.  IMPRESSION: Mild gallbladder distention without visible stones or wall thickening. Mild intrahepatic biliary ductal dilatation.  Pancreatic duct dilatation, presumably related to prior bouts of pancreatitis. No visible  pancreatic mass lesion.  No acute findings in the abdomen or pelvis.  Scattered lower lungs pulmonary nodules. The largest measures 7 mm. If the patient is at high risk for bronchogenic carcinoma, follow-up chest CT at 3-23months is recommended. If the patient is at low risk for bronchogenic carcinoma, follow-up chest CT at 6-12 months is recommended. This recommendation follows the consensus statement: Guidelines for Management of Small Pulmonary Nodules Detected on CT Scans: A Statement from the [place] as published in Radiology 2005; 237:395-400.   Electronically Signed   By: Rolm Baptise M.D.   On: 04/25/2013 23:58   Waldemar Dickens, MD 04/26/2013, 2:39 AM PGY-3, [place] Intern pager: [phone], text pages welcome

## 2013-04-26 NOTE — Progress Notes (Signed)
Inpatient Diabetes Program Recommendations  AACE/ADA: New Consensus Statement on Inpatient Glycemic Control (2013)  Target Ranges:  Prepandial:   less than 140 mg/dL      Peak postprandial:   less than 180 mg/dL (1-2 hours)      Critically ill patients:  140 - 180 mg/dL   Reason for Visit: Hyperglycemia  Results for Timothy Martinez, Timothy Martinez (MRN [id number]) as of 04/26/2013 15:58  Ref. Range 04/20/2013 05:00 04/25/2013 18:46  Glucose Latest Range: 70-99 mg/dL 91 233 (H)    Inpatient Diabetes Program Recommendations Correction (SSI): Add Novolog sensitive Q4 hours while NPO  Note:  Will follow. Thank you. Lorenda Peck, RD, LDN, CDE Inpatient Diabetes Coordinator [phone]

## 2013-04-26 NOTE — H&P (Signed)
Family Medicine Teaching Service Attending Note  I interviewed and examined patient Timothy Martinez and reviewed their tests and x-rays.  I discussed with Dr. Marily Memos and reviewed their note for today.  I agree with their assessment and plan.     Additionally  Feeling some better  Pain has waxed and waned for last 4 days or so.  Has been eating full diet Last bowel movement was > 4 days Elevated lipase and Alk phos and LFTs (he did not have last bout) ? Obstructive cause?  Ask GI opinion as to further work up Needs to have bowel movement - likely constipated from narcotics - laxative vs enema

## 2013-04-26 NOTE — Progress Notes (Signed)
Utilization review completed. Saverio Kader, RN, BSN. 

## 2013-04-26 NOTE — Progress Notes (Addendum)
INITIAL NUTRITION ASSESSMENT  DOCUMENTATION CODES Per approved criteria  -Non-severe (moderate) malnutrition in the context of acute illness   INTERVENTION: 1. Advance diet as tolerated per MD.  2. Patient educated on a low-fat diet following discharge. We reviewed high fat foods. Patient provided with a handout. Teach back method used.   NUTRITION DIAGNOSIS: Inadequate oral intake related to pancreatitis as evidenced by NPO status.   Goal: Patient will meet >/=90% of estimated nutrition needs  Monitor:  Diet advancement, PO intake, weight, labs, I/Os  Reason for Assessment: Malnutrition screening tool  62 y.o. male  Admitting Dx: Recurrent pancreatitis and constipation  ASSESSMENT: Patient with history of DM, COPD, GERD, HLD, and ETOH abuse, admitted with recurrent pancreatitis. He was recently discharge on 9/27 for pancreatitis. Readmission likely due to returning to high fat diet too soon.  Patient is currently NPO, but reports that he is hungry/ready to eat. He states that constipation is improved.  He has lost about 4 pounds in the last week due to minimal oral intake. He also reports a 30 pound weight loss over about 6 months, but this was intentional.   Patient meets the criteria for moderate MALNUTRITION in the context of acute illness with 1% weight loss in 1 week and PO intake <75% of estimated needs.   Height: Ht Readings from Last 1 Encounters:  04/26/13 6\' 3"  (1.905 m)    Weight: Wt Readings from Last 1 Encounters:  04/26/13 197 lb 4.8 oz (89.495 kg)  Weight taken earlier this AM was 191 pounds, which is 10 pounds in 1 week, RD re-weighed at 197.3, which is more likely.   Ideal Body Weight: 196 pounds  % Ideal Body Weight: 101%  Wt Readings from Last 10 Encounters:  04/26/13 197 lb 4.8 oz (89.495 kg)  04/16/13 201 lb 4.5 oz (91.3 kg)  04/11/13 205 lb (92.987 kg)  01/28/13 204 lb (92.534 kg)  12/21/12 202 lb 6.4 oz (91.808 kg)  11/13/12 202 lb (91.627  kg)  10/31/12 204 lb 14.4 oz (92.942 kg)  09/27/12 211 lb (95.709 kg)  08/21/12 205 lb (92.987 kg)  04/26/12 201 lb (91.173 kg)    Usual Body Weight: 200 pounds  % Usual Body Weight: 99%  BMI:  Body mass index is 24.66 kg/(m^2). Patient is normal weight.   Estimated Nutritional Needs: Kcal: 2000-2150 kcal Protein: 90-110 g Fluid: >2.7 L/d  Skin: Intact  Diet Order: NPO  EDUCATION NEEDS: -No education needs identified at this time  No intake or output data in the 24 hours ending 04/26/13 1522  Last BM: 10/3  Labs:   Recent Labs Lab 04/20/13 0500 04/25/13 1846  NA 136 141  K 3.8 4.0  CL 101 103  CO2 25 27  BUN 8 10  CREATININE 0.75 0.78  CALCIUM 9.3 9.7  GLUCOSE 91 233*    CBG (last 3)  No results found for this basename: GLUCAP,  in the last 72 hours  Scheduled Meds: . aspirin  81 mg Oral Daily  . atorvastatin  40 mg Oral Daily  . heparin  5,000 Units Subcutaneous Q8H  . senna  1 tablet Oral BID    Continuous Infusions: . sodium chloride 150 mL/hr at 04/26/13 1113    Past Medical History  Diagnosis Date  . Pancreas disorder   . Diabetes mellitus   . Hyperlipidemia   . Shortness of breath     with ambulation  . Anginal pain   . Pneumonia   . GERD (  gastroesophageal reflux disease)   . Headache(784.0)   . Arthritis     Past Surgical History  Procedure Laterality Date  . Back surgery    . Neck surgery      not cervical, states had lesion on neck which was removed  . Splenectomy      Larey Seat, RD, LDN Pager #: [phone] After-Hours Pager #: [phone]

## 2013-04-27 DIAGNOSIS — J449 Chronic obstructive pulmonary disease, unspecified: Secondary | ICD-10-CM

## 2013-04-27 DIAGNOSIS — E119 Type 2 diabetes mellitus without complications: Secondary | ICD-10-CM

## 2013-04-27 DIAGNOSIS — K859 Acute pancreatitis without necrosis or infection, unspecified: Secondary | ICD-10-CM | POA: Diagnosis not present

## 2013-04-27 MED ORDER — POLYETHYLENE GLYCOL 3350 17 G PO PACK
17.0000 g | PACK | Freq: Two times a day (BID) | ORAL | Status: DC
  Administered 2013-04-27 – 2013-04-30 (×7): 17 g via ORAL
  Filled 2013-04-27 (×8): qty 1

## 2013-04-27 MED ORDER — HYDROMORPHONE HCL PF 1 MG/ML IJ SOLN
1.0000 mg | Freq: Once | INTRAMUSCULAR | Status: AC
  Administered 2013-04-27: 1 mg via INTRAVENOUS
  Filled 2013-04-27: qty 1

## 2013-04-27 MED ORDER — OXYCODONE HCL 5 MG PO TABS
5.0000 mg | ORAL_TABLET | ORAL | Status: DC | PRN
  Administered 2013-04-27 – 2013-04-29 (×10): 5 mg via ORAL
  Filled 2013-04-27 (×10): qty 1

## 2013-04-27 NOTE — Progress Notes (Signed)
Family Medicine Teaching Service Daily Progress Note Intern Pager: [phone]  Patient name: Timothy Martinez Medical record number: [id number] Date of birth: August 09, 1950 Age: 62 y.o. Gender: male  Primary Care Provider: [name], MD Consultants: GI Code Status: full  Pt Overview and Major Events to Date:  9/27 - Discharged with improving pancreatitis flair 10/3 - Readmitted with worsening abdominal pain and elevated lipase  Assessment and Plan: Timothy Martinez is a 63 y.o. male presenting with recurrent pancreatitis. PMH is significant for DM, COPD, GERD, HLD, Tobacco abuse, ETOH abuse   # Pancreatitis: Recurrent episode. Recently discharged on 9/27 after treatment for pancreatitis. Etiology unclear but may be related to ETOH, Hyper tri., or biliary stones. Most likely due to returning to high fat/protein diet too soon. Denies ETOH use. H/o DM (Gluc 233 on admission) and hypertrigliceridemia. CT w/ mild intrahepatic dilitation and pancreatic dilitation, but no stone noted. No evidence of pseudocyst or necrosis  - clear liquids - NS 181ml/hr  - oxycodone 5mg  Q4 PRN. (swelling w/ morphine)  - CIWA  - continue home lipitor 40mg   - GI consulted: want endoscopic u/s outpatient but has bad debt balance with only provider in area, sw consulted.   # DM: Diet controlled. Last A1c 6.5.  - SSI while inpatient   # COPD: Carried Dx of mild to mod. No evidence of exacerbation. No home O2. No requirement on admission  - monitor  - cont home albuterol PRN   # Tobacco abuse: 1ppd  - Nicotine patch   FEN/GI: cLEARS as above. On H2 blocker at home. ONe BM in past week  - Protonix  - zofran PRN  - senna, miralax  Prophylaxis: Hep [place] TID  - ASA 81  Disposition: Home pending improvement in pain and tolerating po  Subjective: Feeling better, still no bm, some increase in pain after broth last night but tolerated clears fine this am  Objective: Temp:  [97.8 F (36.6 C)-98 F (36.7 C)] 98  F (36.7 C) (10/04 0441) Pulse Rate:  [56-72] 56 (10/04 0441) Resp:  [18] 18 (10/04 0441) BP: (106-127)/(57-60) 106/57 mmHg (10/04 0441) SpO2:  [93 %-97 %] 94 % (10/04 0441) Weight:  [197 lb 4.8 oz (89.495 kg)] 197 lb 4.8 oz (89.495 kg) (10/03 1522) Physical Exam: General: NAD  HEENT: EOMI, mmm  Cardiovascular: RRR, no m/r/g 2+ pulses  Respiratory: CTAB, nml effort  Abdomen: normoactive BS, mild TTP along epigastric and LLQ regions  Extremities: no edema  Skin: warm, intact  Neuro: CN grossly intact  Laboratory:  Recent Labs Lab 04/25/13 1846  WBC 9.6  HGB 16.3  HCT 45.4  PLT 283    Recent Labs Lab 04/25/13 1846  NA 141  K 4.0  CL 103  CO2 27  BUN 10  CREATININE 0.78  CALCIUM 9.7  PROT 8.7*  BILITOT 1.2  ALKPHOS 244*  ALT 142*  AST 71*  GLUCOSE 233*    04/26/2013 05:45  Cholesterol 137  Triglycerides 87  HDL 28 (L)  LDL (calc) 92    04/25/2013 18:46  Lipase 665 (H)   Imaging/Diagnostic Tests: CTabd/pelvis: Mild gallbladder distention without visible stones or wall thickening. Mild intrahepatic biliary ductal dilatation. Pancreatic duct dilatation, presumably related to prior bouts of pancreatitis. No visible pancreatic mass lesion. No acute findings in the abdomen or pelvis. Scattered lower lungs pulmonary nodules. The largest measures 7 mm. If the patient is at high risk for bronchogenic carcinoma, follow-up chest CT at 3-49months is recommended.  Beverlyn Roux, MD 04/27/2013, 7:28 AM PGY-1, [place] Intern pager: [phone], text pages welcome

## 2013-04-27 NOTE — Progress Notes (Signed)
FMTS Attending Admission Note: Samantha Olivera,MD I  have seen and examined this patient, reviewed their chart. I have discussed this patient with the resident. I agree with the resident's findings, assessment and care plan.  

## 2013-04-28 DIAGNOSIS — K859 Acute pancreatitis without necrosis or infection, unspecified: Secondary | ICD-10-CM | POA: Diagnosis not present

## 2013-04-28 DIAGNOSIS — E119 Type 2 diabetes mellitus without complications: Secondary | ICD-10-CM | POA: Diagnosis not present

## 2013-04-28 LAB — COMPREHENSIVE METABOLIC PANEL
ALT: 94 U/L — ABNORMAL HIGH (ref 0–53)
AST: 56 U/L — ABNORMAL HIGH (ref 0–37)
Albumin: 3.5 g/dL (ref 3.5–5.2)
Alkaline Phosphatase: 261 U/L — ABNORMAL HIGH (ref 39–117)
BUN: 9 mg/dL (ref 6–23)
CO2: 25 mEq/L (ref 19–32)
Calcium: 9.4 mg/dL (ref 8.4–10.5)
Chloride: 103 mEq/L (ref 96–112)
Creatinine, Ser: 0.69 mg/dL (ref 0.50–1.35)
GFR calc Af Amer: >90 mL/min (ref >90)
GFR calc non Af Amer: >90 mL/min (ref >90)
Glucose, Bld: 85 mg/dL (ref 70–99)
Potassium: 4.1 mEq/L (ref 3.5–5.1)
Sodium: 140 mEq/L (ref 135–145)
Total Bilirubin: 1.3 mg/dL — ABNORMAL HIGH (ref 0.3–1.2)
Total Protein: 7.9 g/dL (ref 6.0–8.3)

## 2013-04-28 LAB — LIPASE, BLOOD: Lipase: 93 U/L — ABNORMAL HIGH (ref 11–59)

## 2013-04-28 NOTE — Progress Notes (Signed)
Family Medicine Teaching Service Daily Progress Note Intern Pager: [phone]  Patient name: Timothy Martinez Medical record number: [id number] Date of birth: 1950-09-17 Age: 62 y.o. Gender: male  Primary Care Provider: [name], MD Consultants: GI Code Status: full  Pt Overview and Major Events to Date:  9/27 - Discharged with improving pancreatitis flair 10/3 - Readmitted with worsening abdominal pain and elevated lipase  Assessment and Plan: Timothy Martinez is a 62 y.o. male presenting with recurrent pancreatitis. PMH is significant for DM, COPD, GERD, HLD, Tobacco abuse, ETOH abuse   # Pancreatitis: Recurrent episode. Recently discharged on 9/27 after treatment for pancreatitis. Smoking is independent risk factor-possible etiology vs. Dietary indiscretion with biscuits/gravy before admission. Denies ETOH use. CT w/ mild intrahepatic dilitation and pancreatic dilitation, but no stone noted. No evidence of pseudocyst or necrosis  - clear liquids--> advance to mechanical soft today, if tolerates, home tomorrow - GI consulted: want endoscopic u/s outpatient but has bad debt balance with only provider in area, sw consulted.  -lipase and LFTs trending down. Trend LFTs.  - oxycodone 5mg  Q4 PRN.  # DM: Diet controlled. Last A1c 6.5.  - cbg 85 this AM, no SSI  # HLD  - continue home lipitor 40mg    # COPD: Carried Dx of mild to mod. No evidence of exacerbation. No home O2. No requirement on admission  - monitor  - cont home albuterol PRN   # Tobacco abuse: 1ppd  - Nicotine patch upon request  FEN/GI: mech softas above. NS 131ml/hr --> 75 ml/hr todayOn H2 blocker at home. ONe BM in past week  - Protonix  - zofran PRN  - senna, miralax--> large BM this AM, continue miralax while on narcotics  Prophylaxis: Hep [place] TID  - ASA 81  Disposition: Home pending improvement in pain and tolerating po  Subjective: large BM this morning, worsening abdominal pain after exam this morning,  patient worried about tolerating a more normal diet but cannot tolerate liquid clears anymore  Objective: Temp:  [97.6 F (36.4 C)-98.2 F (36.8 C)] 97.6 F (36.4 C) (10/05 0442) Pulse Rate:  [63-71] 70 (10/05 0442) Resp:  [18] 18 (10/05 0442) BP: (126-148)/(53-82) 148/82 mmHg (10/05 0442) SpO2:  [95 %-96 %] 95 % (10/05 0442) Physical Exam: General: NAD  HEENT:  mmm  Cardiovascular: RRR, no m/r/g 2+ pulses  Respiratory: CTAB, nml effort  Abdomen: normoactive BS, moderate TTP along epigastric region  Extremities: no edema  Skin: warm, intact  Neuro: CN grossly intact  Laboratory:  Recent Labs Lab 04/25/13 1846  WBC 9.6  HGB 16.3  HCT 45.4  PLT 283    Recent Labs Lab 04/25/13 1846 04/28/13 0601  NA 141 140  K 4.0 4.1  CL 103 103  CO2 27 25  BUN 10 9  CREATININE 0.78 0.69  CALCIUM 9.7 9.4  PROT 8.7* 7.9  BILITOT 1.2 1.3*  ALKPHOS 244* 261*  ALT 142* 94*  AST 71* 56*  GLUCOSE 233* 85    04/26/2013 05:45  Cholesterol 137  Triglycerides 87  HDL 28 (L)  LDL (calc) 92    04/25/2013 18:46  Lipase 665 (H)-->93   Imaging/Diagnostic Tests: CTabd/pelvis: Mild gallbladder distention without visible stones or wall thickening. Mild intrahepatic biliary ductal dilatation. Pancreatic duct dilatation, presumably related to prior bouts of pancreatitis. No visible pancreatic mass lesion. No acute findings in the abdomen or pelvis. Scattered lower lungs pulmonary nodules. The largest measures 7 mm. If the patient is at high risk for  bronchogenic carcinoma, follow-up chest CT at 3-30months is recommended.   Marin Olp, MD 04/28/2013, 10:59 AM PGY-3, [place] Intern pager: [phone], text pages welcome

## 2013-04-28 NOTE — Progress Notes (Signed)
FMTS Attending Admission Note: Leatha Rohner,MD I  have seen and examined this patient, reviewed their chart. I have discussed this patient with the resident. I agree with the resident's findings, assessment and care plan.  

## 2013-04-29 DIAGNOSIS — F17200 Nicotine dependence, unspecified, uncomplicated: Secondary | ICD-10-CM

## 2013-04-29 DIAGNOSIS — E440 Moderate protein-calorie malnutrition: Secondary | ICD-10-CM

## 2013-04-29 DIAGNOSIS — R109 Unspecified abdominal pain: Secondary | ICD-10-CM | POA: Diagnosis not present

## 2013-04-29 DIAGNOSIS — K859 Acute pancreatitis without necrosis or infection, unspecified: Secondary | ICD-10-CM | POA: Diagnosis not present

## 2013-04-29 LAB — COMPREHENSIVE METABOLIC PANEL
ALT: 102 U/L — ABNORMAL HIGH (ref 0–53)
AST: 70 U/L — ABNORMAL HIGH (ref 0–37)
Albumin: 3.6 g/dL (ref 3.5–5.2)
Alkaline Phosphatase: 275 U/L — ABNORMAL HIGH (ref 39–117)
BUN: 8 mg/dL (ref 6–23)
CO2: 24 mEq/L (ref 19–32)
Calcium: 9.3 mg/dL (ref 8.4–10.5)
Chloride: 100 mEq/L (ref 96–112)
Creatinine, Ser: 0.73 mg/dL (ref 0.50–1.35)
GFR calc Af Amer: >90 mL/min (ref >90)
GFR calc non Af Amer: >90 mL/min (ref >90)
Glucose, Bld: 106 mg/dL — ABNORMAL HIGH (ref 70–99)
Potassium: 4.1 mEq/L (ref 3.5–5.1)
Sodium: 136 mEq/L (ref 135–145)
Total Bilirubin: 2.1 mg/dL — ABNORMAL HIGH (ref 0.3–1.2)
Total Protein: 8.1 g/dL (ref 6.0–8.3)

## 2013-04-29 NOTE — Progress Notes (Signed)
FMTS Attending Note  I personally saw and evaluated the patient. The plan of care was discussed with the resident team. I agree with the assessment and plan as documented by the resident.   1. Acute on Chronic Pancreatitis: Patient was not able to tolerate breakfast due to abdominal pain. Transitioned diet to full liquid. Continue current pain medication regimen. 2. Other medical conditions stable.  Dossie Arbour MD

## 2013-04-29 NOTE — Progress Notes (Signed)
Family Medicine Teaching Service Daily Progress Note Intern Pager: [phone]  Patient name: Timothy Martinez Medical record number: [id number] Date of birth: 06/25/1951 Age: 62 y.o. Gender: male  Primary Care Provider: [name], MD Consultants: GI Code Status: full  Pt Overview and Major Events to Date:  9/27 - Discharged with improving pancreatitis flair 10/3 - Readmitted with worsening abdominal pain and elevated lipase  Assessment and Plan: Timothy Martinez is a 62 y.o. male presenting with recurrent pancreatitis. PMH is significant for DM, COPD, GERD, HLD, Tobacco abuse, ETOH abuse   # Pancreatitis: Recurrent episode. Recently discharged on 9/27 after treatment for pancreatitis. Smoking is independent risk factor-possible etiology vs. Dietary indiscretion with biscuits/gravy before admission. Denies ETOH use. CT w/ mild intrahepatic dilitation and pancreatic dilitation, but no stone noted. No evidence of pseudocyst or necrosis  - unable to tolerate soft breakfast -> back to full liquids - GI consulted: want endoscopic u/s outpatient but has bad debt balance with only provider in area, sw consulted.  -lipase and LFTs trending down. Trend LFTs.  - oxycodone 5mg  Q4 PRN, got 4 doses yesterday  # DM: Diet controlled. Last A1c 6.5.  - cbg 85 this AM, no SSI  # HLD  - continue home lipitor 40mg    # COPD: Carried Dx of mild to mod. No evidence of exacerbation. No home O2. No requirement on admission  - monitor  - cont home albuterol PRN   # Tobacco abuse: 1ppd  - Nicotine patch upon request  FEN/GI: full liquid as above. NS 75 ml/hr today On H2 blocker at home. - Protonix  - zofran PRN  - senna, miralax  Prophylaxis: Hep  TID  - ASA 81  Disposition: Home pending improvement in pain and tolerating po  Subjective: worsening abdominal pain after eating eggs for breakfast  Objective: Temp:  [97.6 F (36.4 C)-98.2 F (36.8 C)] 97.6 F (36.4 C) (10/06 0425) Pulse Rate:   [52-60] 58 (10/06 0425) Resp:  [18] 18 (10/06 0425) BP: (136-143)/(66-77) 136/77 mmHg (10/06 0425) SpO2:  [96 %-97 %] 96 % (10/06 0425) Physical Exam: General: NAD  HEENT:  mmm  Cardiovascular: RRR, no m/r/g 2+ pulses  Respiratory: CTAB, nml effort  Abdomen: normoactive BS, very TTP along epigastric region, voluntary guarding  Extremities: no edema  Skin: warm, intact  Neuro: CN grossly intact  Laboratory:  Recent Labs Lab 04/25/13 1846  WBC 9.6  HGB 16.3  HCT 45.4  PLT 283    Recent Labs Lab 04/25/13 1846 04/28/13 0601 04/29/13 0529  NA 141 140 136  K 4.0 4.1 4.1  CL 103 103 100  CO2 27 25 24   BUN 10 9 8   CREATININE 0.78 0.69 0.73  CALCIUM 9.7 9.4 9.3  PROT 8.7* 7.9 8.1  BILITOT 1.2 1.3* 2.1*  ALKPHOS 244* 261* 275*  ALT 142* 94* 102*  AST 71* 56* 70*  GLUCOSE 233* 85 106*    04/26/2013 05:45  Cholesterol 137  Triglycerides 87  HDL 28 (L)  LDL (calc) 92    04/25/2013 18:46  Lipase 665 (H)-->93   Imaging/Diagnostic Tests: CTabd/pelvis: Mild gallbladder distention without visible stones or wall thickening. Mild intrahepatic biliary ductal dilatation. Pancreatic duct dilatation, presumably related to prior bouts of pancreatitis. No visible pancreatic mass lesion. No acute findings in the abdomen or pelvis. Scattered lower lungs pulmonary nodules. The largest measures 7 mm. If the patient is at high risk for bronchogenic carcinoma, follow-up chest CT at 3-66months is recommended.  Beverlyn Roux,  MD 04/29/2013, 8:34 AM PGY-1, [place] Intern pager: [phone], text pages welcome

## 2013-04-30 DIAGNOSIS — E1110 Type 2 diabetes mellitus with ketoacidosis without coma: Secondary | ICD-10-CM

## 2013-04-30 DIAGNOSIS — K861 Other chronic pancreatitis: Secondary | ICD-10-CM

## 2013-04-30 DIAGNOSIS — E785 Hyperlipidemia, unspecified: Secondary | ICD-10-CM

## 2013-04-30 DIAGNOSIS — K859 Acute pancreatitis without necrosis or infection, unspecified: Secondary | ICD-10-CM | POA: Diagnosis not present

## 2013-04-30 DIAGNOSIS — R109 Unspecified abdominal pain: Secondary | ICD-10-CM | POA: Diagnosis not present

## 2013-04-30 LAB — COMPREHENSIVE METABOLIC PANEL
ALT: 140 U/L — ABNORMAL HIGH (ref 0–53)
AST: 118 U/L — ABNORMAL HIGH (ref 0–37)
Albumin: 3.6 g/dL (ref 3.5–5.2)
Alkaline Phosphatase: 313 U/L — ABNORMAL HIGH (ref 39–117)
BUN: 8 mg/dL (ref 6–23)
CO2: 24 mEq/L (ref 19–32)
Calcium: 9.6 mg/dL (ref 8.4–10.5)
Chloride: 101 mEq/L (ref 96–112)
Creatinine, Ser: 0.75 mg/dL (ref 0.50–1.35)
GFR calc Af Amer: >90 mL/min (ref >90)
GFR calc non Af Amer: >90 mL/min (ref >90)
Glucose, Bld: 103 mg/dL — ABNORMAL HIGH (ref 70–99)
Potassium: 4.3 mEq/L (ref 3.5–5.1)
Sodium: 136 mEq/L (ref 135–145)
Total Bilirubin: 1.5 mg/dL — ABNORMAL HIGH (ref 0.3–1.2)
Total Protein: 8.2 g/dL (ref 6.0–8.3)

## 2013-04-30 MED ORDER — POLYETHYLENE GLYCOL 3350 17 G PO PACK: 17.0000 g | PACK | Freq: Two times a day (BID) | ORAL | Status: DC

## 2013-04-30 MED ORDER — OXYCODONE HCL 5 MG PO TABS: 5.0000 mg | ORAL_TABLET | ORAL | Status: DC | PRN

## 2013-04-30 NOTE — Progress Notes (Signed)
NUTRITION FOLLOW UP  Intervention:   1. Continued education on low fat diet   Nutrition Dx:   Inadequate oral intake related to pancreatitis as evidenced by NPO status. Resolved   Goal:   Pt will meet >/=90% estimated nutrition needs. Likely met  Monitor:   PO intake, weight trends, education needs   Assessment:   Pt states appetite is good and has not had any pain with food since 10/6 at dinner. Did well with breakfast this morning.  Encouraged pt to continue to avoid high fat foods. Pt reports if he tolerates meals today, he will be d/c'd.   Height: Ht Readings from Last 1 Encounters:  04/29/13 6\' 3"  (1.905 m)    Weight Status:   Wt Readings from Last 1 Encounters:  04/29/13 191 lb 1 oz (86.665 kg)    Re-estimated needs:  Kcal: 2000-2150 kcal  Protein: 90-110 g  Fluid: >2.7 L/d  Skin: intact   Diet Order: Criss Rosales   Intake/Output Summary (Last 24 hours) at 04/30/13 1148 Last data filed at 04/30/13 1000  Gross per 24 hour  Intake 1538.75 ml  Output    850 ml  Net 688.75 ml    Last BM: 10/7   Labs:   Recent Labs Lab 04/28/13 0601 04/29/13 0529 04/30/13 0655  NA 140 136 136  K 4.1 4.1 4.3  CL 103 100 101  CO2 25 24 24   BUN 9 8 8   CREATININE 0.69 0.73 0.75  CALCIUM 9.4 9.3 9.6  GLUCOSE 85 106* 103*    CBG (last 3)  No results found for this basename: GLUCAP,  in the last 72 hours  Scheduled Meds: . aspirin  81 mg Oral Daily  . atorvastatin  40 mg Oral Daily  . heparin  5,000 Units Subcutaneous Q8H  . polyethylene glycol  17 g Oral BID  . senna  1 tablet Oral BID    Continuous Infusions: . sodium chloride 75 mL/hr at 04/28/13 2354    Pricilla Holm RD, LDN Pager [phone] After Hours pager [phone]

## 2013-04-30 NOTE — Discharge Instructions (Signed)
You were admitted with a flair of your pancreatitis and constipation. Your pain gradually improved and you were able to tolerate more food. Please continue to avoid fatty foods, fried foods, and strong flavors. Please also continue to eat small meals and increase this slowly so that you do not cause more pain.   Acute Pancreatitis Acute pancreatitis is a disease in which the pancreas becomes suddenly irritated (inflamed). The pancreas is a large gland behind your stomach. The pancreas makes enzymes that help digest food. The pancreas also makes 2 hormones that help control your blood sugar. Acute pancreatitis happens when the enzymes attack and damage the pancreas. Most attacks last a couple of days and can cause serious problems. HOME CARE  Follow your doctor's diet instructions. You may need to avoid alcohol and limit fat in your diet.  Eat small meals often.  Drink enough fluids to keep your pee (urine) clear or pale yellow.  Only take medicines as told by your doctor.  Avoid drinking alcohol.  Do not smoke.  Get plenty of rest.  Check your blood sugar at home as told by your doctor.  Keep all doctor visits as told. GET HELP RIGHT AWAY IF:   You are unable to eat or keep fluids down.  Your pain becomes severe and is not controlled by your home pain medicine  You have a fever or lasting symptoms for more than 2 to 3 days.  You have a fever and your symptoms suddenly get worse.  Your skin or the white part of your eyes turn yellow (jaundice).  You throw up (vomit).  You feel dizzy, or you pass out (faint).  Your blood sugar is high (over 300 mg/dL).  You do not get better as quickly as expected.  You have new or worsening symptoms.  You have lasting pain, weakness, or feel sick to your stomach (nauseous).  You get better and then have another pain attack. MAKE SURE YOU:   Understand these instructions.  Will watch your condition.  Will get help right away if  you are not doing well or get worse. Document Released: 12/28/2007 Document Revised: 01/10/2012 Document Reviewed: 10/20/2011 [facility] Patient Information 2014 [place].

## 2013-04-30 NOTE — Progress Notes (Signed)
Patient was discharged home. Patient was given discharge paperwork and prescriptions. Patient was given information on pancreatitis and foods to avoid. Patient walked out and refused a wheelchair or help. Patient was stable upon discharge.

## 2013-05-01 NOTE — Discharge Summary (Signed)
[facility] Discharge Summary  Patient name: Timothy Martinez Medical record number: [id number] Date of birth: [birth year] Age: 62 y.o. Gender: male Date of Admission: 04/25/2013  Date of Discharge: 04/30/13 Admitting Physician: Lind Covert, MD  Primary Care Provider: [name], MD Consultants: none  Indication for Hospitalization: Recurrent pancreatitis  Discharge Diagnoses/Problem List:  Patient Active Problem List   Diagnosis Date Noted  . Malnutrition of moderate degree 04/26/2013  . Pancreatitis, acute 04/17/2013  . Diabetes mellitus 04/17/2013  . Low back pain 12/21/2012  . Abdominal pain, unspecified site 09/28/2012  . Chest pain 08/22/2012  . Polyuria 08/21/2012  . COPD (chronic obstructive pulmonary disease) 04/27/2012  . Overweight (BMI 25.0-29.9) 03/27/2012  . GERD (gastroesophageal reflux disease) 03/05/2012  . Alcohol abuse 06/09/2011  . Diabetes Mellitus Type II 05/30/2011  . Chronic alcoholic pancreatitis [id number]  . Hyperlipidemia 05/30/2011  . Tobacco abuse 05/30/2011    Disposition: home  Discharge Condition: improved  Discharge Exam: General: NAD, sitting up eating breakfast  HEENT: mmm, EOMI  Cardiovascular: RRR, no m/r/g, 2+ dp pulses  Respiratory: CTAB, nml effort  Abdomen: soft, nondistended, normoactive BS, minimally TTP along epigastric region, no rebound or guarding Extremities: no edema  Skin: warm, intact  Neuro: CN grossly intact, alert and oriented   Brief Hospital Course:  Mr Timothy Martinez was admitted with a re-flair of his recurrent pancreatitis thought to be due to dietary indiscretion (biscuits and gravy) shortly after being discharged for the same. He gradually improved and his pain control was transitioned from dilaudid to oxycodone and he was able to tolerate a bland diet on the day of discharge.  For his diabetes we had him on SSI while here but he required no insulin and he takes no medications  for this at home.   Issues for Follow Up:  Abdominal pain: minimal in day of discharge, likely due to a combination of pancreatitis and constipation. Counsel patient on use of miralax prn for daily bowel movements and evaluate his diet and substance use as he was counseled to avoid high fat meals and alcohol.  Significant Procedures: none  Significant Labs and Imaging:   Recent Labs Lab 04/25/13 1846  WBC 9.6  HGB 16.3  HCT 45.4  PLT 283    Recent Labs Lab 04/25/13 1846 04/28/13 0601 04/29/13 0529 04/30/13 0655  NA 141 140 136 136  K 4.0 4.1 4.1 4.3  CL 103 103 100 101  CO2 27 25 24 24   GLUCOSE 233* 85 106* 103*  BUN 10 9 8 8   CREATININE 0.78 0.69 0.73 0.75  CALCIUM 9.7 9.4 9.3 9.6  ALKPHOS 244* 261* 275* 313*  AST 71* 56* 70* 118*  ALT 142* 94* 102* 140*  ALBUMIN 3.9 3.5 3.6 3.6    04/25/2013 18:46 04/28/2013 06:01  Lipase 665 (H) 93 (H)    Results/Tests Pending at Time of Discharge: none  Discharge Medications:    Medication List         albuterol 108 (90 BASE) MCG/ACT inhaler  Commonly known as:  PROVENTIL HFA;VENTOLIN HFA  Inhale 2 puffs into the lungs every 4 (four) hours as needed for wheezing or shortness of breath. May have other brand than proair.     aspirin 81 MG tablet  Take 81 mg by mouth daily.     atorvastatin 40 MG tablet  Commonly known as:  LIPITOR  Take 1 tablet (40 mg total) by mouth daily.     multivitamins ther. w/minerals Tabs  tablet  Take 1 tablet by mouth daily.     oxyCODONE 5 MG immediate release tablet  Commonly known as:  Oxy IR/ROXICODONE  Take 1 tablet (5 mg total) by mouth every 4 (four) hours as needed.     polyethylene glycol packet  Commonly known as:  MIRALAX / GLYCOLAX  Take 17 g by mouth 2 (two) times daily.     ranitidine 150 MG tablet  Commonly known as:  ZANTAC  Take 1 tablet (150 mg total) by mouth 2 (two) times daily.     thiamine 100 MG tablet  Commonly known as:  VITAMIN B-1  Take 100 mg by mouth  daily.     traMADol 50 MG tablet  Commonly known as:  ULTRAM  Take 0.5 tablets (25 mg total) by mouth daily as needed for pain (low back pain).        Discharge Instructions: Please refer to Patient Instructions section of EMR for full details.  Patient was counseled important signs and symptoms that should prompt return to medical care, changes in medications, dietary instructions, activity restrictions, and follow up appointments.   Follow-Up Appointments: Follow-up Information   Follow up with [name], MD On 05/07/2013. (3:30pm)    Specialty:  Family Medicine   Contact information:   [address] [place] [address] [phone]       Beverlyn Roux, MD 05/01/2013, 4:58 PM PGY-1, [place]

## 2013-05-01 NOTE — Discharge Summary (Signed)
I agree with the discharge summary as documented.   Dim Meisinger MD  

## 2013-05-07 ENCOUNTER — Ambulatory Visit (INDEPENDENT_AMBULATORY_CARE_PROVIDER_SITE_OTHER): Payer: Medicare Other | Admitting: Family Medicine

## 2013-05-07 ENCOUNTER — Encounter: Payer: Self-pay | Admitting: Family Medicine

## 2013-05-07 VITALS — BP 112/70 | HR 91 | Ht 75.0 in | Wt 194.0 lb

## 2013-05-07 DIAGNOSIS — K861 Other chronic pancreatitis: Secondary | ICD-10-CM

## 2013-05-07 DIAGNOSIS — F17200 Nicotine dependence, unspecified, uncomplicated: Secondary | ICD-10-CM

## 2013-05-07 DIAGNOSIS — Z720 Tobacco use: Secondary | ICD-10-CM

## 2013-05-07 DIAGNOSIS — K860 Alcohol-induced chronic pancreatitis: Secondary | ICD-10-CM

## 2013-05-07 MED ORDER — OXYCODONE HCL 5 MG PO TABS: 2.5000 mg | ORAL_TABLET | Freq: Three times a day (TID) | ORAL | Status: DC | PRN

## 2013-05-07 NOTE — Patient Instructions (Addendum)
Remember to call Dr Benson Norway. # ________________________  Timothy Martinez cut back to 7-10 cigarettes and your goal is 0. Great job working towards your goal. 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   Use pain medicine as sparingly as possible. We gave you another 20 pills so you will have some available but don't rely on them.   Keep taking miralax while on the pain medicine.   Keep up the great job walking!   See me in 3 months at the latest or sooner as needed, Dr. Yong Channel  Health Maintenance Due  Topic Date Due  . Tetanus/tdap  10/27/1970  . Colonoscopy  10/26/2001

## 2013-05-07 NOTE — Progress Notes (Signed)
[facility] [name], MD Phone: [phone]  Subjective:  Chief complaint-noted. Hospital follow up.   # Recurrent pancreatitis Admitted twice over past month with pancreatitis. Most recently after getting discharged went home and ate biscuits and gravy and flared symptoms. Patient states he is tolerating PO since getting home. He does have some mild epigastric pain and is using oxycodone 3x a day. Not drinking alcohol. Miralax daily is controlling constipation.  ROS- no nausea/vomiting. No fevers/chills.   # Tobacco abuse Down to 7-10 cigarettes per day. Knows how important this is for his pancreas as well ROS-no chest pain. Occasional SOB and uses albuterol  ROS--See HPI  Past Medical History Patient Active Problem List   Diagnosis Date Noted  . Alcohol abuse 06/09/2011    Priority: High  . Diabetes Mellitus Type II 05/30/2011    Priority: High  . Tobacco abuse 05/30/2011    Priority: High  . COPD (chronic obstructive pulmonary disease) 04/27/2012    Priority: Medium  . Chronic alcoholic pancreatitis [id number]    Priority: Medium  . Hyperlipidemia 05/30/2011    Priority: Medium  . Low back pain 12/21/2012    Priority: Low  . Abdominal pain, unspecified site 09/28/2012    Priority: Low  . Chest pain 08/22/2012    Priority: Low  . GERD (gastroesophageal reflux disease) 03/05/2012    Priority: Low  . Polyuria 08/21/2012   Medications- reviewed and updated Current Outpatient Prescriptions on File Prior to Visit  Medication Sig Dispense Refill  . albuterol (PROVENTIL HFA;VENTOLIN HFA) 108 (90 BASE) MCG/ACT inhaler Inhale 2 puffs into the lungs every 4 (four) hours as needed for wheezing or shortness of breath. May have other brand than proair.  1 Inhaler  3  . aspirin 81 MG tablet Take 81 mg by mouth daily.      Marland Kitchen atorvastatin (LIPITOR) 40 MG tablet Take 1 tablet (40 mg total) by mouth daily.  30 tablet  11  . Multiple Vitamins-Minerals  (MULTIVITAMINS THER. W/MINERALS) TABS Take 1 tablet by mouth daily.      . polyethylene glycol (MIRALAX / GLYCOLAX) packet Take 17 g by mouth 2 (two) times daily.  14 each  0  . ranitidine (ZANTAC) 150 MG tablet Take 1 tablet (150 mg total) by mouth 2 (two) times daily.  60 tablet  11  . thiamine (VITAMIN B-1) 100 MG tablet Take 100 mg by mouth daily.      . traMADol (ULTRAM) 50 MG tablet Take 0.5 tablets (25 mg total) by mouth daily as needed for pain (low back pain).  45 tablet  0   No current facility-administered medications on file prior to visit.    Objective: BP 112/70  Pulse 91  Ht 6\' 3"  (1.905 m)  Wt 194 lb (87.998 kg)  BMI 24.25 kg/m2 Gen: NAD, resting comfortably on table CV: RRR no murmurs rubs or gallops Lungs: CTAB no crackles, wheeze, rhonchi Abd: soft, mildly tender in LUQ and epigastric area.  Ext: no edema  Assessment/Plan:

## 2013-05-07 NOTE — Assessment & Plan Note (Signed)
Continues to improve after recent hospitalizations. Gave #20 oxycodone 5 to be used sparingly as he continues to heal.

## 2013-05-07 NOTE — Assessment & Plan Note (Signed)
Encouraged cessation. Down to less than 1/2 PPD. Praised success and encouraged continuation of cutting down.

## 2013-06-11 ENCOUNTER — Encounter: Payer: Self-pay | Admitting: Family Medicine

## 2013-08-01 ENCOUNTER — Ambulatory Visit (INDEPENDENT_AMBULATORY_CARE_PROVIDER_SITE_OTHER): Payer: Medicare Other | Admitting: Family Medicine

## 2013-08-01 ENCOUNTER — Encounter: Payer: Self-pay | Admitting: Family Medicine

## 2013-08-01 VITALS — BP 132/70 | HR 90 | Temp 98.1°F | Wt 195.0 lb

## 2013-08-01 DIAGNOSIS — R351 Nocturia: Secondary | ICD-10-CM

## 2013-08-01 DIAGNOSIS — F17200 Nicotine dependence, unspecified, uncomplicated: Secondary | ICD-10-CM

## 2013-08-01 DIAGNOSIS — E1110 Type 2 diabetes mellitus with ketoacidosis without coma: Secondary | ICD-10-CM

## 2013-08-01 DIAGNOSIS — M545 Low back pain, unspecified: Secondary | ICD-10-CM

## 2013-08-01 DIAGNOSIS — J449 Chronic obstructive pulmonary disease, unspecified: Secondary | ICD-10-CM

## 2013-08-01 DIAGNOSIS — R3589 Other polyuria: Secondary | ICD-10-CM

## 2013-08-01 DIAGNOSIS — R358 Other polyuria: Secondary | ICD-10-CM

## 2013-08-01 DIAGNOSIS — F1010 Alcohol abuse, uncomplicated: Secondary | ICD-10-CM

## 2013-08-01 DIAGNOSIS — Z720 Tobacco use: Secondary | ICD-10-CM

## 2013-08-01 LAB — COMPREHENSIVE METABOLIC PANEL
ALT: 18 U/L (ref 0–53)
AST: 27 U/L (ref 0–37)
Albumin: 4.4 g/dL (ref 3.5–5.2)
Alkaline Phosphatase: 71 U/L (ref 39–117)
BUN: 10 mg/dL (ref 6–23)
CO2: 29 mEq/L (ref 19–32)
Calcium: 10 mg/dL (ref 8.4–10.5)
Chloride: 98 mEq/L (ref 96–112)
Creat: 0.79 mg/dL (ref 0.50–1.35)
Glucose, Bld: 142 mg/dL — ABNORMAL HIGH (ref 70–99)
Potassium: 5.3 mEq/L (ref 3.5–5.3)
Sodium: 133 mEq/L — ABNORMAL LOW (ref 135–145)
Total Bilirubin: 0.6 mg/dL (ref 0.3–1.2)
Total Protein: 8.4 g/dL — ABNORMAL HIGH (ref 6.0–8.3)

## 2013-08-01 LAB — POCT GLYCOSYLATED HEMOGLOBIN (HGB A1C): Hemoglobin A1C: 6.3

## 2013-08-01 MED ORDER — TRAMADOL HCL 50 MG PO TABS: 25.0000 mg | ORAL_TABLET | Freq: Every day | ORAL | Status: DC | PRN

## 2013-08-01 MED ORDER — ALBUTEROL SULFATE HFA 108 (90 BASE) MCG/ACT IN AERS: 2.0000 | INHALATION_SPRAY | RESPIRATORY_TRACT | Status: DC | PRN

## 2013-08-01 NOTE — Assessment & Plan Note (Signed)
Ordered ventolin specifically. Using 3x a week. May need controller.

## 2013-08-01 NOTE — Assessment & Plan Note (Signed)
Once again suspect BPH. Patient does not want flomax. Will obtain PSA (discussed with lab and given symptoms and not screening-insurance will pay for) though may be mildly elevated with BPH.

## 2013-08-01 NOTE — Assessment & Plan Note (Signed)
Still 1 ppd. Advised cessation. Life stressors (sister in hospice and not doing well) so not ready to quit.

## 2013-08-01 NOTE — Progress Notes (Signed)
[name], MD Phone: [phone]  Subjective:  Chief complaint-noted  # DIABETES Type II Medications taking and tolerating-no medications, off metformin Blood Sugars per patient-has occasionally checked in the past if felt dizzy (not in 3-4 months)-right at 100 when checked Diet-baking most foods, eating lots of vegatables Regular Exercise-walking 40 minutes a day, 5 days days a week.   On Aspirin-yes On statin-yes, atorvastatin 40 Daily foot monitoring-yes, does have some stable cool feelings in left foot which neurosurgeon or orthopedics told him he would have  ROS- Endorses 3x a night getting up to pee. No Polyuria in day time or Polydipsia, Vision changes, feet numbness/pain/tingling. Denies Hypoglycemia symptoms (shaky, sweaty, hungry, weak anxious, tremor, palpitations, confusion, behavior change).   Hemoglobin a1c:  Lab Results  Component Value Date   HGBA1C 6.3 08/01/2013   HGBA1C 6.5 04/11/2013   HGBA1C 6.8 12/21/2012   COPD/tobacco abuse new inhaler (not proair) couldn't get. Would prefer ventolin and this was ordered. Using inhaler 3x a week mainly for cough, tried girlfriends ventolin and worked much better, think she would have to use inhaler less if it wasn't the proair.  1 ppd. Not ready to quit.  ROS-no chest pain. shortness of breath only minimal. No unexplained weight loss (trying to lose weight)  Low back pain-1/2 tramadol prn usually just twice a week. Needs PT but still not a good time lifew[facility]. Still not sure of doctor who did back surgery.   Alcohol abuse-still not drinking any alcohol.   Nocturia-3x a night, refused flomax. Did not want PSA due to insurance concern. Endorses mild polyuria in the day but does endorse urgency, incomplete emptying and weak stream at times. No family history of prostate cancer.   Past Medical History Patient Active Problem List   Diagnosis Date Noted  . Alcohol abuse 06/09/2011    Priority: High  . Diabetes Mellitus  Type II 05/30/2011    Priority: High  . Tobacco abuse 05/30/2011    Priority: High  . COPD (chronic obstructive pulmonary disease) 04/27/2012    Priority: Medium  . Chronic alcoholic pancreatitis 32/35/5732    Priority: Medium  . Hyperlipidemia 05/30/2011    Priority: Medium  . Low back pain 12/21/2012    Priority: Low  . Abdominal pain, unspecified site 09/28/2012    Priority: Low  . GERD (gastroesophageal reflux disease) 03/05/2012    Priority: Low  . Polyuria 08/21/2012    Medications- reviewed and updated Current Outpatient Prescriptions  Medication Sig Dispense Refill  . albuterol (PROVENTIL HFA;VENTOLIN HFA) 108 (90 BASE) MCG/ACT inhaler Inhale 2 puffs into the lungs every 4 (four) hours as needed for wheezing or shortness of breath. Ventolin please.  1 Inhaler  3  . aspirin 81 MG tablet Take 81 mg by mouth daily.      Marland Kitchen atorvastatin (LIPITOR) 40 MG tablet Take 1 tablet (40 mg total) by mouth daily.  30 tablet  11  . Multiple Vitamins-Minerals (MULTIVITAMINS THER. W/MINERALS) TABS Take 1 tablet by mouth daily.      Marland Kitchen oxyCODONE (OXY IR/ROXICODONE) 5 MG immediate release tablet Take 0.5-1 tablets (2.5-5 mg total) by mouth every 8 (eight) hours as needed.  20 tablet  0  . polyethylene glycol (MIRALAX / GLYCOLAX) packet Take 17 g by mouth 2 (two) times daily.  14 each  0  . ranitidine (ZANTAC) 150 MG tablet Take 1 tablet (150 mg total) by mouth 2 (two) times daily.  60 tablet  11  . thiamine (VITAMIN B-1) 100 MG  tablet Take 100 mg by mouth daily.      . traMADol (ULTRAM) 50 MG tablet Take 0.5 tablets (25 mg total) by mouth daily as needed.  45 tablet  0   No current facility-administered medications for this visit.    Objective: BP 132/70  Pulse 90  Temp(Src) 98.1 F (36.7 C) (Oral)  Wt 195 lb (88.451 kg) Gen: NAD, resting comfortably CV: RRR no murmurs rubs or gallops Lungs: CTAB no crackles, wheeze, rhonchi Ext: no edema  Assessment/Plan:  Alcohol abuse Continues  to abstain. Congratulated patient.   Diabetes Mellitus Type II Well controlled. Improving now to a1c of 6.3. Working hard on diet and exercise. No medication at this time.   Tobacco abuse Still 1 ppd. Advised cessation. Life stressors (sister in hospice and not doing well) so not ready to quit.  COPD (chronic obstructive pulmonary disease) Ordered ventolin specifically. Using 3x a week. May need controller.   Low back pain Refilled tramadol at this time (using sparingly if pain severe). Still trying to get records. Advised needs PT when life issues slow down.   Polyuria Once again suspect BPH. Patient does not want flomax. Will obtain PSA (discussed with lab and given symptoms and not screening-insurance will pay for) though may be mildly elevated with BPH.     Orders Placed This Encounter  Procedures  . PSA  . Comprehensive metabolic panel  . HgB A1c    Meds ordered this encounter  Medications  . albuterol (PROVENTIL HFA;VENTOLIN HFA) 108 (90 BASE) MCG/ACT inhaler    Sig: Inhale 2 puffs into the lungs every 4 (four) hours as needed for wheezing or shortness of breath. Ventolin please.    Dispense:  1 Inhaler    Refill:  3    Please give ventolin. Has had issues with proair.  Marland Kitchen traMADol (ULTRAM) 50 MG tablet    Sig: Take 0.5 tablets (25 mg total) by mouth daily as needed.    Dispense:  45 tablet    Refill:  0    Health Maintenance Due  Topic Date Due  . Tetanus/tdap  10/27/1970  . Colonoscopy  10/26/2001

## 2013-08-01 NOTE — Assessment & Plan Note (Signed)
Refilled tramadol at this time (using sparingly if pain severe). Still trying to get records. Advised needs PT when life issues slow down.

## 2013-08-01 NOTE — Assessment & Plan Note (Signed)
Well controlled. Improving now to a1c of 6.3. Working hard on diet and exercise. No medication at this time.

## 2013-08-01 NOTE — Assessment & Plan Note (Signed)
Continues to abstain. Congratulated patient.

## 2013-08-01 NOTE — Patient Instructions (Addendum)
Diabetes  Doing well with a1c of 6.3 (goal <7)  Keep walking and eating healthy. Great job!   Smoking/COPD  Still advise you to quit, may help with reducing risk of pancreatitis and progression COPD  Trying to get you ventolin to see if that helps  Low back pain  Glad rare use of tramadol is working  We need to send you to PT at some point  Try to figure out the doctor who did your surgery  Keep up the great job not drinking alcohol  Nighttime peeing  Let's check a PSA today  Let me know if you change your mind on Flomax  You can use benadryl/diphenhydramine 12.5mg  to start to try to help with sleep  See you in 3 months or sooner as needed, Dr. Yong Channel

## 2013-08-02 ENCOUNTER — Encounter: Payer: Self-pay | Admitting: Family Medicine

## 2013-08-02 ENCOUNTER — Other Ambulatory Visit: Payer: Self-pay | Admitting: Family Medicine

## 2013-08-02 LAB — PSA: PSA: 2.01 ng/mL (ref <=4.00)

## 2013-08-09 ENCOUNTER — Encounter: Payer: Self-pay | Admitting: Family Medicine

## 2013-10-11 ENCOUNTER — Encounter: Payer: Self-pay | Admitting: Family Medicine

## 2013-10-11 ENCOUNTER — Ambulatory Visit (HOSPITAL_COMMUNITY)
Admission: RE | Admit: 2013-10-11 | Discharge: 2013-10-11 | Disposition: A | Discharge status: Home / Self Care | Payer: Medicare Other | Source: Ambulatory Visit | Attending: Family Medicine | Admitting: Family Medicine

## 2013-10-11 ENCOUNTER — Ambulatory Visit (INDEPENDENT_AMBULATORY_CARE_PROVIDER_SITE_OTHER): Payer: Medicare Other | Admitting: Family Medicine

## 2013-10-11 VITALS — BP 126/58 | HR 68 | Temp 98.5°F | Wt 200.0 lb

## 2013-10-11 DIAGNOSIS — M25579 Pain in unspecified ankle and joints of unspecified foot: Secondary | ICD-10-CM | POA: Diagnosis not present

## 2013-10-11 DIAGNOSIS — M79609 Pain in unspecified limb: Secondary | ICD-10-CM | POA: Diagnosis not present

## 2013-10-11 DIAGNOSIS — R3589 Other polyuria: Secondary | ICD-10-CM | POA: Diagnosis not present

## 2013-10-11 DIAGNOSIS — M79672 Pain in left foot: Secondary | ICD-10-CM

## 2013-10-11 DIAGNOSIS — R358 Other polyuria: Secondary | ICD-10-CM

## 2013-10-11 DIAGNOSIS — M7989 Other specified soft tissue disorders: Secondary | ICD-10-CM | POA: Insufficient documentation

## 2013-10-11 NOTE — Patient Instructions (Signed)
Go to [facility] for your x-ray. I will call you with our plans.  We will consider prednisone for itching/pain/inflammation if film is negative.  If no improvement with prednisone, would consider sending to sports medicine.   Thanks, Dr. Yong Channel  See me back within 1-2 months so we can talk about your other chronic health conditions and the following: Health Maintenance Due  Topic Date Due  . Tetanus/tdap  10/27/1970  . Colonoscopy  10/26/2001

## 2013-10-12 DIAGNOSIS — M79672 Pain in left foot: Secondary | ICD-10-CM | POA: Insufficient documentation

## 2013-10-12 HISTORY — DX: Pain in left foot: M79.672

## 2013-10-12 MED ORDER — TAMSULOSIN HCL 0.4 MG PO CAPS: 0.4000 mg | ORAL_CAPSULE | Freq: Every day | ORAL | Status: DC

## 2013-10-12 NOTE — Assessment & Plan Note (Signed)
Patient asked during visit for foot pain if he could start flomax. This was ordered for him at this time.

## 2013-10-12 NOTE — Progress Notes (Signed)
[name], MD Phone: [phone]  Subjective:  Chief complaint-noted  Left foot pain  Over the last week, he developed a pain in the middle of his foot-localized to somewhere between 1-3rd toe around MTP joint. He states each morning he wakes up and will wake up and almost immediately start feeling a peel on the bottom of his foot near 2nd MTP. IT will start causing an intense itching. He will rub a pad with alcohol on it for an hour and it will start to feel numb which is the main relief he gets. IT seems to get some better as the day goes on. He has been walking with a cane due to the level of pain. Patient States he has had some intermittent cramping in his left calf mainly at night over the last 6 months but this has been more intense since he has had this pain. He does feel like the foot gets swollen and red but usually after he intensely rubs it down. He has tramadol for back pain and that helps him tolerate the foot pain.   ROS-Denies cramping calf pain as consequence of walking. No fever/chills. Does feel numbness in bottom of left foot but this is a chronic issue as he had lumbar spinal surgery due to left sided back pain that radiated into left leg. No fecal or urinary incontinence, denies back pain or pain shooting down leg.   Of note, patient down to 3 cigarettes per day but now using e-cigraettes.   Past Medical History- Patient Active Problem List   Diagnosis Date Noted  . Alcohol abuse 06/09/2011    Priority: High  . Diabetes Mellitus Type II 05/30/2011    Priority: High  . Tobacco abuse 05/30/2011    Priority: High  . COPD (chronic obstructive pulmonary disease) 04/27/2012    Priority: Medium  . Chronic alcoholic pancreatitis 56/21/3086    Priority: Medium  . Hyperlipidemia 05/30/2011    Priority: Medium  . Low back pain 12/21/2012    Priority: Low  . Abdominal pain, unspecified site 09/28/2012    Priority: Low  . GERD (gastroesophageal reflux disease)  03/05/2012    Priority: Low  . Left foot pain 10/12/2013  . Polyuria 08/21/2012   Medications- reviewed and updated Current Outpatient Prescriptions  Medication Sig Dispense Refill  . albuterol (PROVENTIL HFA;VENTOLIN HFA) 108 (90 BASE) MCG/ACT inhaler Inhale 2 puffs into the lungs every 4 (four) hours as needed for wheezing or shortness of breath. Ventolin please.  1 Inhaler  3  . aspirin 81 MG tablet Take 81 mg by mouth daily.      Marland Kitchen atorvastatin (LIPITOR) 40 MG tablet Take 1 tablet (40 mg total) by mouth daily.  30 tablet  11  . ranitidine (ZANTAC) 150 MG tablet Take 1 tablet (150 mg total) by mouth 2 (two) times daily.  60 tablet  11  . traMADol (ULTRAM) 50 MG tablet Take 0.5 tablets (25 mg total) by mouth daily as needed.  45 tablet  0  . Multiple Vitamins-Minerals (MULTIVITAMINS THER. W/MINERALS) TABS Take 1 tablet by mouth daily.      . polyethylene glycol (MIRALAX / GLYCOLAX) packet Take 17 g by mouth 2 (two) times daily.  14 each  0  . tamsulosin (FLOMAX) 0.4 MG CAPS capsule Take 1 capsule (0.4 mg total) by mouth daily.  30 capsule  3  . thiamine (VITAMIN B-1) 100 MG tablet Take 100 mg by mouth daily.       No current facility-administered  medications for this visit.    Objective: BP 126/58  Pulse 68  Temp(Src) 98.5 F (36.9 C) (Oral)  Wt 200 lb (90.719 kg) Gen: NAD, resting comfortably CV: RRR no murmurs rubs or gallops 2+ DP and PT pulses MSK: Left foot with no pain to palpation except at 2nd MTP joint both dorsally and on plantar side.  Neuro: Sensation starts at the ankle to monofilament testing on left foot, but normal on right (patient reports he has always had deficits). Review of my last foot exam on 01/28/13 showed normal monofilament testing Strength 5/5 in bilateral lower extremities though plantar flexion of foot limited by calf spasms.  Ext: no edema bilaterally, no calf tenderness unless cramping  Dg Foot Complete Left  10/11/2013   CLINICAL DATA:  Plantar  pain and knee second metatarsophalangeal joint with soft tissue swelling.  EXAM: LEFT FOOT - COMPLETE 3+ VIEW  COMPARISON:  None.  FINDINGS: Three views of the left foot reveal the bones to be adequately mineralized. There is no evidence of an acute or healing fracture. The interphalangeal joints are reasonably well maintained for age. The metatarsophalangeal joints appear normal as well. No abnormal soft tissue calcifications over the forefoot are demonstrated. The knee tarsals and metatarsals appear normal. There is small plantar calcaneal spur.  IMPRESSION: There is no acute or significant chronic bony abnormality of the left foot. No acute soft tissue abnormality is demonstrated either.   Electronically Signed   By: David  Martinique   On: 10/11/2013 14:18    Assessment/Plan:  Polyuria Patient asked during visit for foot pain if he could start flomax. This was ordered for him at this time.   Left foot pain Unclear etiology. Has pain with walking but due to localized pain to are over MTP. 2+ pulses bilaterally (though is a diabetic smoker with hypertension), I doubt claudication at this time. Wells 0 for DVT. GIven ability to localize tenderness and fact patient got back to walking regularly in recent weeks, stress fracture would be possibility so obtained foot x-rays which were negative for fracture. Precepted with Dr. Mingo Amber who suggested sports medicine referral if initial x-ray negative. Given pain over MTP specifically, possibly a capsulitis. I would like to trial 1 week of prednisone to see if this will help pain and then have patient back for reevaluation. If no improvement and etiology no more clear, would consider sports medicine referral at that time. I will call patient with results of x-ray and plan.

## 2013-10-12 NOTE — Assessment & Plan Note (Signed)
Unclear etiology. Has pain with walking but due to localized pain to are over MTP. 2+ pulses bilaterally (though is a diabetic smoker with hypertension), I doubt claudication at this time. Wells 0 for DVT. GIven ability to localize tenderness and fact patient got back to walking regularly in recent weeks, stress fracture would be possibility so obtained foot x-rays which were negative for fracture. Precepted with Dr. Mingo Amber who suggested sports medicine referral if initial x-ray negative. Given pain over MTP specifically, possibly a capsulitis. I would like to trial 1 week of prednisone to see if this will help pain and then have patient back for reevaluation. If no improvement and etiology no more clear, would consider sports medicine referral at that time. I will call patient with results of x-ray and plan.

## 2013-10-13 ENCOUNTER — Telehealth: Payer: Self-pay | Admitting: Family Medicine

## 2013-10-13 MED ORDER — PREDNISONE 20 MG PO TABS: 40.0000 mg | ORAL_TABLET | Freq: Every day | ORAL | Status: DC

## 2013-10-13 NOTE — Telephone Encounter (Signed)
Discussed x-ray results by phone. Trial prednisone x 1 week. Follow up in 1 week.

## 2014-02-09 ENCOUNTER — Emergency Department (HOSPITAL_COMMUNITY): Payer: Medicare Other

## 2014-02-09 ENCOUNTER — Encounter (HOSPITAL_COMMUNITY): Payer: Self-pay | Admitting: Emergency Medicine

## 2014-02-09 ENCOUNTER — Emergency Department (HOSPITAL_COMMUNITY)
Admission: EM | Admit: 2014-02-09 | Discharge: 2014-02-09 | Disposition: A | Discharge status: Home / Self Care | Payer: Medicare Other | Attending: Emergency Medicine | Admitting: Emergency Medicine

## 2014-02-09 DIAGNOSIS — K861 Other chronic pancreatitis: Secondary | ICD-10-CM | POA: Diagnosis not present

## 2014-02-09 DIAGNOSIS — M1380 Other specified arthritis, unspecified site: Secondary | ICD-10-CM | POA: Diagnosis not present

## 2014-02-09 DIAGNOSIS — S60229A Contusion of unspecified hand, initial encounter: Secondary | ICD-10-CM | POA: Diagnosis not present

## 2014-02-09 DIAGNOSIS — Y9289 Other specified places as the place of occurrence of the external cause: Secondary | ICD-10-CM | POA: Diagnosis not present

## 2014-02-09 DIAGNOSIS — R110 Nausea: Secondary | ICD-10-CM | POA: Diagnosis not present

## 2014-02-09 DIAGNOSIS — W230XXA Caught, crushed, jammed, or pinched between moving objects, initial encounter: Secondary | ICD-10-CM | POA: Insufficient documentation

## 2014-02-09 DIAGNOSIS — K862 Cyst of pancreas: Secondary | ICD-10-CM | POA: Diagnosis not present

## 2014-02-09 DIAGNOSIS — Z880 Allergy status to penicillin: Secondary | ICD-10-CM | POA: Insufficient documentation

## 2014-02-09 DIAGNOSIS — E119 Type 2 diabetes mellitus without complications: Secondary | ICD-10-CM | POA: Insufficient documentation

## 2014-02-09 DIAGNOSIS — Z7982 Long term (current) use of aspirin: Secondary | ICD-10-CM | POA: Insufficient documentation

## 2014-02-09 DIAGNOSIS — K860 Alcohol-induced chronic pancreatitis: Secondary | ICD-10-CM

## 2014-02-09 DIAGNOSIS — K2970 Gastritis, unspecified, without bleeding: Secondary | ICD-10-CM | POA: Diagnosis not present

## 2014-02-09 DIAGNOSIS — Z79899 Other long term (current) drug therapy: Secondary | ICD-10-CM | POA: Diagnosis not present

## 2014-02-09 DIAGNOSIS — R1084 Generalized abdominal pain: Secondary | ICD-10-CM | POA: Diagnosis not present

## 2014-02-09 DIAGNOSIS — R10819 Abdominal tenderness, unspecified site: Secondary | ICD-10-CM | POA: Diagnosis not present

## 2014-02-09 DIAGNOSIS — S6990XA Unspecified injury of unspecified wrist, hand and finger(s), initial encounter: Secondary | ICD-10-CM | POA: Diagnosis not present

## 2014-02-09 DIAGNOSIS — F17200 Nicotine dependence, unspecified, uncomplicated: Secondary | ICD-10-CM | POA: Insufficient documentation

## 2014-02-09 DIAGNOSIS — M7989 Other specified soft tissue disorders: Secondary | ICD-10-CM | POA: Diagnosis not present

## 2014-02-09 DIAGNOSIS — K219 Gastro-esophageal reflux disease without esophagitis: Secondary | ICD-10-CM | POA: Insufficient documentation

## 2014-02-09 DIAGNOSIS — E785 Hyperlipidemia, unspecified: Secondary | ICD-10-CM | POA: Insufficient documentation

## 2014-02-09 DIAGNOSIS — K863 Pseudocyst of pancreas: Secondary | ICD-10-CM | POA: Diagnosis not present

## 2014-02-09 DIAGNOSIS — S60221A Contusion of right hand, initial encounter: Secondary | ICD-10-CM

## 2014-02-09 DIAGNOSIS — Y9389 Activity, other specified: Secondary | ICD-10-CM | POA: Diagnosis not present

## 2014-02-09 LAB — CBC WITH DIFFERENTIAL/PLATELET
Basophils Absolute: 0.1 10*3/uL (ref 0.0–0.1)
Basophils Relative: 1 % (ref 0–1)
Eosinophils Absolute: 0.3 10*3/uL (ref 0.0–0.7)
Eosinophils Relative: 3 % (ref 0–5)
HCT: 48.5 % (ref 39.0–52.0)
Hemoglobin: 16.3 g/dL (ref 13.0–17.0)
Lymphocytes Relative: 30 % (ref 12–46)
Lymphs Abs: 2.7 10*3/uL (ref 0.7–4.0)
MCH: 32 pg (ref 26.0–34.0)
MCHC: 33.6 g/dL (ref 30.0–36.0)
MCV: 95.3 fL (ref 78.0–100.0)
Monocytes Absolute: 1.5 10*3/uL — ABNORMAL HIGH (ref 0.1–1.0)
Monocytes Relative: 17 % — ABNORMAL HIGH (ref 3–12)
Neutro Abs: 4.4 10*3/uL (ref 1.7–7.7)
Neutrophils Relative %: 49 % (ref 43–77)
Platelets: 236 10*3/uL (ref 150–400)
RBC: 5.09 MIL/uL (ref 4.22–5.81)
RDW: 14.2 % (ref 11.5–15.5)
WBC: 9 10*3/uL (ref 4.0–10.5)

## 2014-02-09 LAB — COMPREHENSIVE METABOLIC PANEL
ALT: 23 U/L (ref 0–53)
AST: 27 U/L (ref 0–37)
Albumin: 3.6 g/dL (ref 3.5–5.2)
Alkaline Phosphatase: 74 U/L (ref 39–117)
Anion gap: 16 — ABNORMAL HIGH (ref 5–15)
BUN: 9 mg/dL (ref 6–23)
CO2: 24 mEq/L (ref 19–32)
Calcium: 8.7 mg/dL (ref 8.4–10.5)
Chloride: 101 mEq/L (ref 96–112)
Creatinine, Ser: 0.76 mg/dL (ref 0.50–1.35)
GFR calc Af Amer: >90 mL/min (ref >90)
GFR calc non Af Amer: >90 mL/min (ref >90)
Glucose, Bld: 96 mg/dL (ref 70–99)
Potassium: 4.4 mEq/L (ref 3.7–5.3)
Sodium: 141 mEq/L (ref 137–147)
Total Bilirubin: 0.6 mg/dL (ref 0.3–1.2)
Total Protein: 7.5 g/dL (ref 6.0–8.3)

## 2014-02-09 LAB — URINALYSIS, ROUTINE W REFLEX MICROSCOPIC
Bilirubin Urine: NEGATIVE
Glucose, UA: NEGATIVE mg/dL
Ketones, ur: NEGATIVE mg/dL
Leukocytes, UA: NEGATIVE
Nitrite: NEGATIVE
Protein, ur: NEGATIVE mg/dL
Specific Gravity, Urine: 1.01 (ref 1.005–1.030)
Urobilinogen, UA: 0.2 mg/dL (ref 0.0–1.0)
pH: 5.5 (ref 5.0–8.0)

## 2014-02-09 LAB — URINE MICROSCOPIC-ADD ON

## 2014-02-09 LAB — LIPASE, BLOOD: Lipase: 43 U/L (ref 11–59)

## 2014-02-09 LAB — I-STAT TROPONIN, ED: Troponin i, poc: 0.06 ng/mL (ref 0.00–0.08)

## 2014-02-09 MED ORDER — OXYCODONE-ACETAMINOPHEN 5-325 MG PO TABS: 1.0000 | ORAL_TABLET | ORAL | Status: DC | PRN

## 2014-02-09 MED ORDER — ONDANSETRON HCL 4 MG PO TABS: 4.0000 mg | ORAL_TABLET | Freq: Four times a day (QID) | ORAL | Status: DC

## 2014-02-09 MED ORDER — HYDROMORPHONE HCL PF 1 MG/ML IJ SOLN
1.0000 mg | Freq: Once | INTRAMUSCULAR | Status: AC
  Administered 2014-02-09: 1 mg via INTRAVENOUS
  Filled 2014-02-09: qty 1

## 2014-02-09 MED ORDER — OXYCODONE-ACETAMINOPHEN 5-325 MG PO TABS
2.0000 | ORAL_TABLET | Freq: Once | ORAL | Status: AC
  Administered 2014-02-09: 2 via ORAL
  Filled 2014-02-09: qty 2

## 2014-02-09 MED ORDER — ONDANSETRON HCL 4 MG/2ML IJ SOLN
4.0000 mg | Freq: Once | INTRAMUSCULAR | Status: AC
Start: 2014-02-09 — End: 2014-02-09
  Administered 2014-02-09: 4 mg via INTRAVENOUS
  Filled 2014-02-09: qty 2

## 2014-02-09 MED ORDER — SODIUM CHLORIDE 0.9 % IV BOLUS (SEPSIS)
1000.0000 mL | Freq: Once | INTRAVENOUS | Status: AC
  Administered 2014-02-09: 1000 mL via INTRAVENOUS

## 2014-02-09 NOTE — Discharge Instructions (Signed)
Hand Contusion A hand contusion is a deep bruise on your hand area. Contusions are the result of an injury that caused bleeding under the skin. The contusion may turn blue, purple, or yellow. Minor injuries will give you a painless contusion, but more severe contusions may stay painful and swollen for a few weeks. CAUSES  A contusion is usually caused by a blow, trauma, or direct force to an area of the body. SYMPTOMS   Swelling and redness of the injured area.  Discoloration of the injured area.  Tenderness and soreness of the injured area.  Pain. DIAGNOSIS  The diagnosis can be made by taking a history and performing a physical exam. An X-ray, CT scan, or MRI may be needed to determine if there were any associated injuries, such as broken bones (fractures). TREATMENT  Often, the best treatment for a hand contusion is resting, elevating, icing, and applying cold compresses to the injured area. Over-the-counter medicines may also be recommended for pain control. HOME CARE INSTRUCTIONS   Put ice on the injured area.  Put ice in a plastic bag.  Place a towel between your skin and the bag.  Leave the ice on for 15-20 minutes, 03-04 times a day.  Only take over-the-counter or prescription medicines as directed by your caregiver. Your caregiver may recommend avoiding anti-inflammatory medicines (aspirin, ibuprofen, and naproxen) for 48 hours because these medicines may increase bruising.  If told, use an elastic wrap as directed. This can help reduce swelling. You may remove the wrap for sleeping, showering, and bathing. If your fingers become numb, cold, or blue, take the wrap off and reapply it more loosely.  Elevate your hand with pillows to reduce swelling.  Avoid overusing your hand if it is painful. SEEK IMMEDIATE MEDICAL CARE IF:   You have increased redness, swelling, or pain in your hand.  Your swelling or pain is not relieved with medicines.  You have loss of feeling in  your hand or are unable to move your fingers.  Your hand turns cold or blue.  You have pain when you move your fingers.  Your hand becomes warm to the touch.  Your contusion does not improve in 2 days. MAKE SURE YOU:   Understand these instructions.  Will watch your condition.  Will get help right away if you are not doing well or get worse. Document Released: 12/31/2001 Document Revised: 04/04/2012 Document Reviewed: 01/02/2012 [facility] Patient Information 2015 [place], [place]. This information is not intended to replace advice given to you by your health care provider. Make sure you discuss any questions you have with your health care provider.  Low-Fat Diet for Pancreatitis or Gallbladder Conditions A low-fat diet can be helpful if you have pancreatitis or a gallbladder condition. With these conditions, your pancreas and gallbladder have trouble digesting fats. A healthy eating plan with less fat will help rest your pancreas and gallbladder and reduce your symptoms. WHAT DO I NEED TO KNOW ABOUT THIS DIET?  Eat a low-fat diet.  Reduce your fat intake to less than 20-30% of your total daily calories. This is less than 50-60 g of fat per day.  Remember that you need some fat in your diet. Ask your dietician what your daily goal should be.  Choose nonfat and low-fat healthy foods. Look for the words "nonfat," "low fat," or "fat free."  As a guide, look on the label and choose foods with less than 3 g of fat per serving. Eat only one serving.  Avoid alcohol.  Do not smoke. If you need help quitting, talk with your health care provider.  Eat small frequent meals instead of three large heavy meals. WHAT FOODS CAN I EAT? Grains Include healthy grains and starches such as potatoes, wheat bread, fiber-rich cereal, and brown rice. Choose whole grain options whenever possible. In adults, whole grains should account for 45-65% of your daily calories.  Fruits and Vegetables Eat plenty  of fruits and vegetables. Fresh fruits and vegetables add fiber to your diet. Meats and Other Protein Sources Eat lean meat such as chicken and pork. Trim any fat off of meat before cooking it. Eggs, fish, and beans are other sources of protein. In adults, these foods should account for 10-35% of your daily calories. Dairy Choose low-fat milk and dairy options. Dairy includes fat and protein, as well as calcium.  Fats and Oils Limit high-fat foods such as fried foods, sweets, baked goods, sugary drinks.  Other Creamy sauces and condiments, such as mayonnaise, can add extra fat. Think about whether or not you need to use them, or use smaller amounts or low fat options. WHAT FOODS ARE NOT RECOMMENDED?  High fat foods, such as:  [employer].  Ice cream.  [place] toast.  Sweet rolls.  Pizza.  Cheese bread.  Foods covered with batter, butter, creamy sauces, or cheese.  Fried foods.  Sugary drinks and desserts.  Foods that cause gas or bloating Document Released: 07/16/2013 Document Reviewed: 06/24/2013 [facility] Patient Information 2015 [place], [place]. This information is not intended to replace advice given to you by your health care provider. Make sure you discuss any questions you have with your health care provider. Acute Pancreatitis Acute pancreatitis is a disease in which the pancreas becomes suddenly inflamed. The pancreas is a large gland located behind your stomach. The pancreas produces enzymes that help digest food. The pancreas also releases the hormones glucagon and insulin that help regulate blood sugar. Damage to the pancreas occurs when the digestive enzymes from the pancreas are activated and begin attacking the pancreas before being released into the intestine. Most acute attacks last a couple of days and can cause serious complications. Some people become dehydrated and develop low blood pressure. In severe cases, bleeding into the pancreas can lead to shock and can  be life-threatening. The lungs, heart, and kidneys may fail. CAUSES  Pancreatitis can happen to anyone. In some cases, the cause is unknown. Most cases are caused by:  Alcohol abuse.  Gallstones. Other less common causes are:  Certain medicines.  Exposure to certain chemicals.  Infection.  Damage caused by an accident (trauma).  Abdominal surgery. SYMPTOMS   Pain in the upper abdomen that may radiate to the back.  Tenderness and swelling of the abdomen.  Nausea and vomiting. DIAGNOSIS  Your caregiver will perform a physical exam. Blood and stool tests may be done to confirm the diagnosis. Imaging tests may also be done, such as X-rays, CT scans, or an ultrasound of the abdomen. TREATMENT  Treatment usually requires a stay in the hospital. Treatment may include:  Pain medicine.  Fluid replacement through an intravenous line (IV).  Placing a tube in the stomach to remove stomach contents and control vomiting.  Not eating for 3 or 4 days. This gives your pancreas a rest, because enzymes are not being produced that can cause further damage.  Antibiotic medicines if your condition is caused by an infection.  Surgery of the pancreas or gallbladder. HOME CARE INSTRUCTIONS  Follow the diet advised by your caregiver. This may involve avoiding alcohol and decreasing the amount of fat in your diet.  Eat smaller, more frequent meals. This reduces the amount of digestive juices the pancreas produces.  Drink enough fluids to keep your urine clear or pale yellow.  Only take over-the-counter or prescription medicines as directed by your caregiver.  Avoid drinking alcohol if it caused your condition.  Do not smoke.  Get plenty of rest.  Check your blood sugar at home as directed by your caregiver.  Keep all follow-up appointments as directed by your caregiver. SEEK MEDICAL CARE IF:   You do not recover as quickly as expected.  You develop new or worsening  symptoms.  You have persistent pain, weakness, or nausea.  You recover and then have another episode of pain. SEEK IMMEDIATE MEDICAL CARE IF:   You are unable to eat or keep fluids down.  Your pain becomes severe.  You have a fever or persistent symptoms for more than 2 to 3 days.  You have a fever and your symptoms suddenly get worse.  Your skin or the white part of your eyes turn yellow (jaundice).  You develop vomiting.  You feel dizzy, or you faint.  Your blood sugar is high (over 300 mg/dL). MAKE SURE YOU:   Understand these instructions.  Will watch your condition.  Will get help right away if you are not doing well or get worse. Document Released: 07/11/2005 Document Revised: 01/10/2012 Document Reviewed: 10/20/2011 [facility] Patient Information 2015 [place], [place]. This information is not intended to replace advice given to you by your health care provider. Make sure you discuss any questions you have with your health care provider.

## 2014-02-09 NOTE — ED Provider Notes (Signed)
TIME SEEN: 9:15 PM  CHIEF COMPLAINT: Abdominal pain, nausea, right hand pain  HPI: Patient is a 63 y.o. M who is right-hand dominant with a history of diabetes, hyperlipidemia, pancreatitis who presents the emergency department with complaints of left upper quadrant, epigastric pain for the past several days of nausea. He denies to me any vomiting or diarrhea. No bloody stool or melena. He states this feels exactly like his prior episodes of pancreatitis. He states he drinks 3 beers on Wednesday, 4 days ago which may have triggered his symptoms. He denies any chest pain or shortness of breath. He is also complaining of right hand pain. He states he is working on his girlfriend's car when the hood of her car slammed on his hand. He has difficulty flexing his fingers secondary to pain and swelling. Normal sensation. No other injury.  ROS: See HPI Constitutional: no fever  Eyes: no drainage  ENT: no runny nose   Cardiovascular:  no chest pain  Resp: no SOB  GI: no vomiting GU: no dysuria Integumentary: no rash  Allergy: no hives  Musculoskeletal: no leg swelling  Neurological: no slurred speech ROS otherwise negative  PAST MEDICAL HISTORY/PAST SURGICAL HISTORY:  Past Medical History  Diagnosis Date  . Pancreas disorder   . Diabetes mellitus   . Hyperlipidemia   . Shortness of breath     with ambulation  . Anginal pain   . Pneumonia   . GERD (gastroesophageal reflux disease)   . Headache(784.0)   . Arthritis     MEDICATIONS:  Prior to Admission medications   Medication Sig Start Date End Date Taking? Authorizing Provider  aspirin EC 81 MG tablet Take 81 mg by mouth daily.   Yes Historical Provider, MD  atorvastatin (LIPITOR) 40 MG tablet Take 1 tablet (40 mg total) by mouth daily. 01/28/13  Yes Marin Olp, MD  ranitidine (ZANTAC) 150 MG tablet Take 1 tablet (150 mg total) by mouth 2 (two) times daily. 03/11/13 03/11/14 Yes Marin Olp, MD  tamsulosin (FLOMAX) 0.4 MG CAPS  capsule Take 1 capsule (0.4 mg total) by mouth daily. 10/12/13  Yes Marin Olp, MD  albuterol (PROVENTIL HFA;VENTOLIN HFA) 108 (90 BASE) MCG/ACT inhaler Inhale 2 puffs into the lungs every 4 (four) hours as needed for wheezing or shortness of breath. Ventolin please. 08/01/13   Marin Olp, MD    ALLERGIES:  Allergies  Allergen Reactions  . Morphine And Related Swelling    Pt states he can tolerate oxycodone and hydromorphone  . Penicillins Rash    SOCIAL HISTORY:  History  Substance Use Topics  . Smoking status: Current Every Day Smoker -- 1.00 packs/day for 45 years    Types: Cigarettes    Start date: 04/09/1964  . Smokeless tobacco: Never Used     Comment: trying to quit. smoked 3 ppd before  . Alcohol Use: 0.0 oz/week    6-7 Cans of beer per week     Comment: quit  05/28/11    FAMILY HISTORY: Family History  Problem Relation Age of Onset  . Diabetes Mother   . Aneurysm Mother   . Alcohol abuse Father     EXAM: BP 130/69  Pulse 83  Temp(Src) 98.2 F (36.8 C) (Oral)  Resp 24  SpO2 94% CONSTITUTIONAL: Alert and oriented and responds appropriately to questions. Well-appearing; well-nourished HEAD: Normocephalic EYES: Conjunctivae clear, PERRL ENT: normal nose; no rhinorrhea; moist mucous membranes; pharynx without lesions noted NECK: Supple, no meningismus, no LAD  CARD: RRR; S1 and S2 appreciated; no murmurs, no clicks, no rubs, no gallops RESP: Normal chest excursion without splinting or tachypnea; breath sounds clear and equal bilaterally; no wheezes, no rhonchi, no rales,  ABD/GI: Normal bowel sounds; non-distended; soft, mildly tender palpation in the epigastric region and left upper quadrant, no guarding or rebound, no peritoneal signs, negative Murphy sign, no tenderness at McBurney's point BACK:  The back appears normal and is non-tender to palpation, there is no CVA tenderness EXT: Right hand is swollen and diffusely tender to palpation without obvious  bony deformity, 2+ radial pulses bilaterally, sensation to light touch intact diffusely, normal range of motion in his fingers and wrist on the right hand, otherwise Normal ROM in all joints; non-tender to palpation; no edema; normal capillary refill; no cyanosis    SKIN: Normal color for age and race; warm NEURO: Moves all extremities equally PSYCH: The patient's mood and manner are appropriate. Grooming and personal hygiene are appropriate.  MEDICAL DECISION MAKING: Patient here with symptoms of pancreatitis, right hand injury. We'll obtain abdominal labs, EKG and troponin, right hand x-ray. I do not feel he needs abdominal imaging at this time as his abdominal exam is relatively benign. He denies any vomiting or diarrhea. He is well-appearing, nontoxic. He has been able to tolerate by mouth. No concern for perforated ulcer, dissection, cholecystitis  ED PROGRESS: Patient's labs are unremarkable. His lipase is normal. Suspect chronic pancreatitis. His abdominal exam is still benign. X-ray of his right hand shows no fracture, dislocation. Have discussed with him rest, elevation and ice. We'll discharge with small amount of pain and nausea medication. He states he has outpatient followup. Have discussed return precautions and supportive care instructions. He verbalizes understanding and is comfortable with plan.     [place], DO 02/09/14 [id number]

## 2014-02-09 NOTE — ED Notes (Signed)
Abdominal pain x2 days with pain getting worse. N/V present and tenderness in the LUQ. Denies back or shoulder pain. 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 Patient also injured his right hand yesterday when he closed it in a truck hood. CNS intact. Patient has had 123mcg of Fentanyl and 4mg  of Zofran. 127/82, 86 NSR, RA sat of 88% and 94% on 2L. Pain is now a 7/10.

## 2014-02-21 ENCOUNTER — Other Ambulatory Visit: Payer: Self-pay | Admitting: Family Medicine

## 2014-03-06 ENCOUNTER — Encounter: Payer: Self-pay | Admitting: Family Medicine

## 2014-03-06 ENCOUNTER — Ambulatory Visit (INDEPENDENT_AMBULATORY_CARE_PROVIDER_SITE_OTHER): Payer: Medicare Other | Admitting: Family Medicine

## 2014-03-06 VITALS — BP 144/83 | HR 84 | Temp 98.0°F | Wt 190.0 lb

## 2014-03-06 DIAGNOSIS — K861 Other chronic pancreatitis: Secondary | ICD-10-CM | POA: Diagnosis not present

## 2014-03-06 DIAGNOSIS — K860 Alcohol-induced chronic pancreatitis: Secondary | ICD-10-CM

## 2014-03-06 DIAGNOSIS — M5441 Lumbago with sciatica, right side: Secondary | ICD-10-CM

## 2014-03-06 DIAGNOSIS — J4489 Other specified chronic obstructive pulmonary disease: Secondary | ICD-10-CM

## 2014-03-06 DIAGNOSIS — R358 Other polyuria: Secondary | ICD-10-CM

## 2014-03-06 DIAGNOSIS — R3589 Other polyuria: Secondary | ICD-10-CM

## 2014-03-06 DIAGNOSIS — J449 Chronic obstructive pulmonary disease, unspecified: Secondary | ICD-10-CM | POA: Diagnosis not present

## 2014-03-06 DIAGNOSIS — M5430 Sciatica, unspecified side: Secondary | ICD-10-CM

## 2014-03-06 DIAGNOSIS — E1110 Type 2 diabetes mellitus with ketoacidosis without coma: Secondary | ICD-10-CM

## 2014-03-06 LAB — POCT GLYCOSYLATED HEMOGLOBIN (HGB A1C): Hemoglobin A1C: 6.3

## 2014-03-06 MED ORDER — BACLOFEN 10 MG PO TABS: 10.0000 mg | ORAL_TABLET | ORAL | Status: DC | PRN

## 2014-03-06 MED ORDER — TRAMADOL HCL 50 MG PO TABS: 50.0000 mg | ORAL_TABLET | Freq: Four times a day (QID) | ORAL | Status: DC | PRN

## 2014-03-06 NOTE — Progress Notes (Signed)
  Patient name: Timothy Martinez MRN [id number]  Date of birth: 1951/03/01  CC & HPI:  Timothy Martinez is a 63 y.o. male presenting today for low back pain and follow-up for DM and COPD  BACK PAIN Quality: sharp  Location & Radiation: right flank with radiation to right hamstring Timing (Onset, Duration, Freq): Starting ~ 1 week ago and has been waxing and waning Worse at night   Better with: taking 1/3 of percocet pills for ED visit for hand pain  Any Trauma: Denies recent trauma but report previous back surgery (can't recall specifics)   Red Flags  Urinary retention: no   Numbness/Weakness: no   Fever/chills/sweats: no   Night pain: no   Unexplained weight loss: no   No relief with bedrest: no   Hx of cancer/immunosuppression: no   Hx of IV drug use: no   Hx of osteoporosis or chronic steroid use: no   DIABETES   Blood Sugar Ranges: not checking  Symptoms of Hypoglycemia? No   Comorbid Symptoms: Denies Chest pain;SOB; but complains of chronic left leg/foot Neuropathy  Medication Compliance: Current diet controlled  COPD - 1.5 ppd smoker - Report SOB and currently using MDI twice a day - denies previous PFT or recent hospitalizations   ROS: See HPI  Objective Findings:  Vitals: BP 144/83  Pulse 84  Temp(Src) 98 F (36.7 C) (Oral)  Wt 190 lb (86.183 kg)  Gen: NAD CV: RRR w/o m/r/g, pulses +2 b/l Resp: CTAB w/ normal respiratory effort Back: Normal skin w/o rash, Spine with normal alignment and no deformity. NO tenderness to vertebral process palpation.  Paraspinous muscles are mildly tender on right without spasm.  Right SLR: positive; Negative XSLR Bilateral LE strength, reflexes and sensation intact  Assessment & Plan:   Please See Problem Focused Assessment & Plan

## 2014-03-06 NOTE — Assessment & Plan Note (Signed)
Has cut back on alcohol use - reports occasional beer; ~ 1-2 beers a month

## 2014-03-06 NOTE — Assessment & Plan Note (Addendum)
He has stopped Flomax - which increased his frequency. Not sure why he was taking this

## 2014-03-06 NOTE — Patient Instructions (Addendum)
It was great seeing you today.   1. Take ultram 50 mg every 6 hours as needed; May increase to 2 pills every 6 hours 2. Take Baclofen at night as needed for muscle spasm / pain 3. Do back stretches and exercise 3-4 times a week   Please bring all your medications to every doctors visit  Sign up for My Chart to have easy access to your labs results, and communication with your Primary care physician.  Next Appointment  Please make an appointment with Dr Berkley Harvey in 2 weeks  Make appointment with Dr Valentina Lucks for Pulmonary function test   I look forward to talking with you again at our next visit. If you have any questions or concerns before then, please call the clinic at [phone].  Take Care,   Dr Phill Myron  Low Back Strain with Rehab   A strain is an injury in which a tendon or muscle is torn. The muscles and tendons of the lower back are vulnerable to strains. However, these muscles and tendons are very strong and require a great force to be injured.    SYMPTOMS  Pain in the lower back.  Pain that affects one side more than the other.  Pain that gets worse with movement and may be felt in the hip, buttocks, or back of the thigh.  Muscle spasms of the muscles in the back.  Swelling along the muscles of the back.  Loss of strength of the back muscles.  Crackling sound (crepitation) when the muscles are touched.   CAUSES   Lower back strains occur when a force is placed on the muscles or tendons that is greater than they can handle. Common causes of injury include:  Prolonged overuse of the muscle-tendon units in the lower back, usually from incorrect posture.  A single violent injury or force applied to the back.   RISK INCREASES WITH    Sports that involve twisting forces on the spine or a lot of bending at the waist (football, rugby, weightlifting, bowling, golf, tennis, speed skating, racquetball, swimming, running, gymnastics, diving).  Poor strength and  flexibility.  Failure to warm up properly before activity.  Family history of lower back pain or disk disorders.  Previous back injury or surgery (especially fusion).  Poor posture with lifting, especially heavy objects.  Prolonged sitting, especially with poor posture.   PREVENTIVE MEASURES    Learn and use proper posture when sitting or lifting (maintain proper posture when sitting, lift using the knees and legs, not at the waist).  Warm up and stretch properly before activity.  Allow for adequate recovery between workouts.  Maintain physical fitness: l Strength, flexibility, and endurance. l Cardiovascular fitness.   PROGNOSIS If treated properly, lower back strains usually heal within 6 weeks.   POSSIBLE COMPLICATIONS    Recurring symptoms, resulting in a chronic problem.  Chronic inflammation, scarring, and partial muscle-tendon tear.  Delayed healing or resolution of symptoms.   GENERAL TREATMENT CONSIDERATIONS   Treatment first involves the use of ice and medicine, to reduce pain and inflammation. The use of strengthening and stretching exercises will help reduce pain with activity. These exercises may be performed at home or with a therapist. Severe injuries may require referral to a therapist for further evaluation and treatment, such as ultrasound. Your caregiver may advise that you wear a back brace or corset, to help reduce pain and discomfort. Often, prolonged bed rest results in greater harm then benefit. Corticosteroid injections may be  recommended. However, these should be reserved for the most serious cases. It is important to avoid using your back when lifting objects. At night, sleep on your back on a firm mattress with a pillow placed under your knees. If non-surgical treatment is unsuccessful, surgery may be needed.     MEDICATION:    If pain medicine is needed, nonsteroidal anti-inflammatory medicines (aspirin and ibuprofen), or other minor pain relievers  (acetaminophen), are often advised.    Do not take pain medicine for 7 days before surgery.    Prescription pain relievers may be given, if your caregiver thinks they are needed. Use only as directed and only as much as you need.  Ointments applied to the skin may be helpful.  Corticosteroid injections may be given by your caregiver. These injections should be reserved for the most serious cases, because they may only be given a certain number of times.   HEAT AND COLD:    Cold treatment (icing) should be applied for 10 to 15 minutes every 2 to 3 hours for inflammation and pain, and immediately after activity that aggravates your symptoms. Use ice packs or an ice massage.  Heat treatment may be used before performing stretching and strengthening activities prescribed by your caregiver, physical therapist, or athletic trainer. Use a heat pack or a warm water soak.     SEEK MEDICAL CARE IF:    Symptoms get worse or do not improve in 2 to 4 weeks, despite treatment.  You develop numbness, weakness, or loss of bowel or bladder function.  New, unexplained symptoms develop. (Drugs used in treatment may produce side effects.)     EXERCISES   RANGE OF MOTION AND STRETCHING EXERCISES - Low Back Strain Most people with lower back pain will find that their symptoms get worse with excessive bending forward (flexion) or arching at the lower back (extension). The exercises which will help resolve your symptoms will focus on the opposite motion.     Your physician, physical therapist or athletic trainer will help you determine which exercises will be most helpful to resolve your lower back pain. Do not complete any exercises without first consulting with your caregiver. Discontinue any exercises which make your symptoms worse until you speak to your caregiver.    If you have pain, numbness or tingling which travels down into your buttocks, leg or foot, the goal of the therapy is for these symptoms  to move closer to your back and eventually resolve. Sometimes, these leg symptoms will get better, but your lower back pain may worsen. This is typically an indication of progress in your rehabilitation. Be very alert to any changes in your symptoms and the activities in which you participated in the 24 hours prior to the change. Sharing this information with your caregiver will allow him/her to most efficiently treat your condition.    These exercises may help you when beginning to rehabilitate your injury. Your symptoms may resolve with or without further involvement from your physician, physical therapist or athletic trainer. While completing these exercises, remember:    Restoring tissue flexibility helps normal motion to return to the joints. This allows healthier, less painful movement and activity.  An effective stretch should be held for at least 30 seconds.  A stretch should never be painful. You should only feel a gentle lengthening or release in the stretched tissue.       FLEXION RANGE OF MOTION AND STRETCHING EXERCISES:   STRETCH - Flexion, Single Knee to Chest  Lie on a firm bed or floor with both legs extended in front of you.  Keeping one leg in contact with the floor, bring your opposite knee to your chest. Hold your leg in place by either grabbing behind your thigh or at your knee.  Pull until you feel a gentle stretch in your lower back. Hold ___15___ seconds.  Slowly release your grasp and repeat the exercise with the opposite side. Repeat ___3____ times. Complete this exercise _____1_____ times per day.      STRETCH - Flexion, Double Knee to Chest   Lie on a firm bed or floor with both legs extended in front of you.  Keeping one leg in contact with the floor, bring your opposite knee to your chest.    Tense your stomach muscles to support your back and then lift your other knee to your chest. Hold your legs in place by either grabbing behind your thighs or at  your knees.  Pull both knees toward your chest until you feel a gentle stretch in your lower back. Hold ___15___ seconds.  Tense your stomach muscles and slowly return one leg at a time to the floor. Repeat ____3_ times. Complete this exercise _1____ times per day.      STRETCH - Low Trunk Rotation    Lie on a firm bed or floor. Keeping your legs in front of you, bend your knees so they are both pointed toward the ceiling and your feet are flat on the floor.  Extend your arms out to the side. This will stabilize your upper body by keeping your shoulders in contact with the floor.  Gently and slowly drop both knees together to one side until you feel a gentle stretch in your lower back. Hold for __15_ seconds.    Tense your stomach muscles to support your lower back as you bring your knees back to the starting position. Repeat the exercise to the other side. Repeat __3__ times. Complete this exercise ___1__ times per day       EXTENSION RANGE OF MOTION AND FLEXIBILITY EXERCISES:  STRETCH - Extension, Prone on Elbows   Lie on your stomach on the floor, a bed will be too soft. Place your palms about shoulder width apart and at the height of your head.  Place your elbows under your shoulders. If this is too painful, stack pillows under your chest.  Allow your body to relax so that your hips drop lower and make contact more completely with the floor.  Hold this position for _15___ seconds.  Slowly return to lying flat on the floor. Repeat __3___ times. Complete this exercise ___1___ times per day.      RANGE OF MOTION - Extension, Prone Press Ups    Lie on your stomach on the floor, a bed will be too soft. Place your palms about shoulder width apart and at the height of your head.  Keeping your back as relaxed as possible, slowly straighten your elbows while keeping your hips on the floor. You may adjust the placement of your hands to maximize your comfort. As you gain motion, your  hands will come more underneath your shoulders.  Hold this position ___15__ seconds.  Slowly return to lying flat on the floor. Repeat __3____ times. Complete this exercise __1____ times per day.       RANGE OF MOTION- Quadruped, Neutral Spine   Assume a hands and knees position on a firm surface. Keep your hands under your shoulders and your  knees under your hips. You may place padding under your knees for comfort.    Drop your head and point your tail bone toward the ground below you.  This will round out your lower back like an angry cat. Hold this position for __15____ seconds.    Slowly lift your head and release your tail bone so that your back sags into a large arch, like an old horse.  Hold this position for __15____ seconds.    Repeat this until you feel limber in your lower back.  Now, find your "sweet spot." This will be the most comfortable position somewhere between the two previous positions. This is your neutral spine. Once you have found this position, tense your stomach muscles to support your lower back.  Hold this position for __15____ seconds. Repeat _3____ times. Complete this exercise __1____ times per day.       STRENGTHENING EXERCISES - Low Back Strain These exercises may help you when beginning to rehabilitate your injury. These exercises should be done near your "sweet spot." This is the neutral, low-back arch, somewhere between fully rounded and fully arched, that is your least painful position. When performed in this safe range of motion, these exercises can be used for people who have either a flexion or extension based injury. These exercises may resolve your symptoms with or without further involvement from your physician, physical therapist or athletic trainer. While completing these exercises, remember:    Muscles can gain both the endurance and the strength needed for everyday activities through controlled exercises.  Complete these exercises as  instructed by your physician, physical therapist or athletic trainer. Increase the resistance and repetitions only as guided.  You may experience muscle soreness or fatigue, but the pain or discomfort you are trying to eliminate should never worsen during these exercises. If this pain does worsen, stop and make certain you are following the directions exactly. If the pain is still present after adjustments, discontinue the exercise until you can discuss the trouble with your caregiver.     STRENGTHENING - Deep Abdominals, Pelvic Tilt    Lie on a firm bed or floor. Keeping your legs in front of you, bend your knees so they are both pointed toward the ceiling and your feet are flat on the floor.  Tense your lower abdominal muscles to press your lower back into the floor. This motion will rotate your pelvis so that your tail bone is scooping upwards rather than pointing at your feet or into the floor.  With a gentle tension and even breathing, hold this position for ____10__ seconds. Repeat __10____ times. Complete this exercise ____1___ times per day.      STRENGTHENING - Abdominals, Crunches   Lie on a firm bed or floor. Keeping your legs in front of you, bend your knees so they are both pointed toward the ceiling and your feet are flat on the floor. Cross your arms over your chest.    Slightly tip your chin down without bending your neck.  Tense your abdominals and slowly lift your trunk high enough to just clear your shoulder blades. Lifting higher can put excessive stress on the lower back and does not further strengthen your abdominal muscles.  Control your return to the starting position. Repeat __10___ times. Complete this exercise ____1___ times per day.    STRENGTHENING - Quadruped, Opposite UE/LE Lift   Assume a hands and knees position on a firm surface. Keep your hands under your shoulders and your  knees under your hips. You may place padding under your knees for comfort.     Find your neutral spine and gently tense your abdominal muscles so that you can maintain this position. Your shoulders and hips should form a rectangle that is parallel with the floor and is not twisted.    Keeping your trunk steady, lift your right hand no higher than your shoulder and then your left leg no higher than your hip. Make sure you are not holding your breath. Hold this position ___10___ seconds.  Continuing to keep your abdominal muscles tense and your back steady, slowly return to your starting position. Repeat with the opposite arm and leg. Repeat _10____ times. Complete this exercise __1___ times per day.      STRENGTHENING - Lower Abdominals, Double Knee Lift  Lie on a firm bed or floor. Keeping your legs in front of you, bend your knees so they are both pointed toward the ceiling and your feet are flat on the floor.    Tense your abdominal muscles to brace your lower back and slowly lift both of your knees until they come over your hips. Be certain not to hold your breath.    Hold _10__ seconds. Using your abdominal muscles, return to the starting position in a slow and controlled manner.   Repeat __1____ times. Complete this exercise __1___ times per day.       POSTURE AND BODY MECHANICS CONSIDERATIONS - Low Back Strain Keeping correct posture when sitting, standing or completing your activities will reduce the stress put on different body tissues, allowing injured tissues a chance to heal and limiting painful experiences. The following are general guidelines for improved posture. Your physician or physical therapist will provide you with any instructions specific to your needs. While reading these guidelines, remember:  The exercises prescribed by your provider will help you have the flexibility and strength to maintain correct postures.  The correct posture provides the best environment for your joints to work. All of your joints have less wear and tear when properly  supported by a spine with good posture. This means you will experience a healthier, less painful body.  Correct posture must be practiced with all of your activities, especially prolonged sitting and standing.  Correct posture is as important when doing repetitive low-stress activities (typing) as it is when doing a single heavy-load activity (lifting).       RESTING POSITIONS Consider which positions are most painful for you when choosing a resting position.  If you have pain with flexion-based activities (sitting, bending, stooping, squatting), choose a position that allows you to rest in a less flexed posture. You would want to avoid curling into a fetal position on your side. If your pain worsens with extension-based activities (prolonged standing, working overhead), avoid resting in an extended position such as sleeping on your stomach. Most people will find more comfort when they rest with their spine in a more neutral position, neither too rounded nor too arched.  Lying on a non-sagging bed on your side with a pillow between your knees, or on your back with a pillow under your knees will often provide some relief. Keep in mind, being in any one position for a prolonged period of time, no matter how correct your posture, can still lead to stiffness.     PROPER SITTING POSTURE In order to minimize stress and discomfort on your spine, you must sit with correct posture. Sitting with good posture should be effortless for  a healthy body. Returning to good posture is a gradual process. Many people can work toward this most comfortably by using various supports until they have the flexibility and strength to maintain this posture on their own.   When sitting with proper posture, your ears will fall over your shoulders and your shoulders will fall over your hips. You should use the back of the chair to support your upper back. Your lower back will be in a neutral position, just slightly arched. You may  place a small pillow or folded towel at the base of your lower back for   support.     When working at a desk, create an environment that supports good, upright posture. Without extra support, muscles tire, which leads to excessive strain on joints and other tissues.  Keep these recommendations in mind:   CHAIR:    A chair should be able to slide under your desk when your back makes contact with the back of the chair. This allows you to work closely.  The chair's height should allow your eyes to be level with the upper part of your monitor and your hands to be slightly lower than your elbows.  BODY POSITION  Your feet should make contact with the floor. If this is not possible, use a foot rest.  Keep your ears over your shoulders. This will reduce stress on your neck and lower back.     INCORRECT SITTING POSTURES  If you are feeling tired and unable to assume a healthy sitting posture, do not slouch or slump. This puts excessive strain on your back tissues, causing more damage and pain. Healthier options include:  Using more support, like a lumbar pillow.  Switching tasks to something that requires you to be upright or walking.  Talking a brief walk.  Lying down to rest in a neutral-spine position.      PROLONGED STANDING WHILE SLIGHTLY LEANING FORWARD  When completing a task that requires you to lean forward while standing in one place for a long time, place either foot up on a stationary 2-4 inch high object to help maintain the best posture. When both feet are on the ground, the lower back tends to lose its slight inward curve. If this curve flattens (or becomes too large), then the back and your other joints will experience too much stress, tire more quickly, and can cause pain.     CORRECT STANDING POSTURES Proper standing posture should be assumed with all daily activities, even if they only take a few moments, like when brushing your teeth. As in sitting, your ears should  fall over your shoulders and your shoulders should fall over your hips. You should keep a slight tension in your abdominal muscles to brace your spine. Your tailbone should point down to the ground, not behind your body, resulting in an over-extended swayback posture.       INCORRECT STANDING POSTURES  Common incorrect standing postures include a forward head, locked knees and/or an excessive swayback.     WALKING Walk with an upright posture. Your ears, shoulders and hips should all line-up.     PROLONGED ACTIVITY IN A FLEXED POSITION When completing a task that requires you to bend forward at your waist or lean over a low surface, try to find a way to stabilize 3 out of 4 of your limbs. You can place a hand or elbow on your thigh or rest a knee on the surface you are reaching across. This will  provide you more stability so that your muscles do not fatigue as quickly. By keeping your knees relaxed, or slightly bent, you will also reduce stress across your lower back.     CORRECT LIFTING TECHNIQUES DO :     Assume a wide stance. This will provide you more stability and the opportunity to get as close as possible to the object which you are lifting.  Tense your abdominals to brace your spine. Bend at the knees and hips. Keeping your back locked in a neutral-spine position, lift using your leg muscles. Lift with your legs, keeping your back straight.  Test the weight of unknown objects before attempting to lift them.  Try to keep your elbows locked down at your sides in order get the best strength from your shoulders when carrying an object.  Always ask for help when lifting heavy or awkward objects.    INCORRECT LIFTING TECHNIQUES DO NOT:   Lock your knees when lifting, even if it is a small object.  Bend and twist.  Pivot at your feet or move your feet when needing to change directions.  Assume that you can safely pick up even a paperclip without proper posture.   Document  Released: 07/11/2005  Document Re-Released: 05/08/2009 [employer] Patient Information 2011 [place]

## 2014-03-06 NOTE — Assessment & Plan Note (Signed)
Currently only using Proair twice a day - continues to smoke 1.5 ppd - Repeat PFTs - Address Additional medication needs based on new PFTs and classification

## 2014-03-06 NOTE — Assessment & Plan Note (Signed)
Diet controlled; A1c remains < 7 - will discuss eye exam, foot exam and vaccines next visit

## 2014-03-06 NOTE — Assessment & Plan Note (Signed)
-   Given Ultram # 30 and Baclofen # 5; Heat as needed - Patient educated on home stretches and exercises - f/u in 2 weeks; Consider Xrays given positive SLR and age > 12 if not improved; Possible PT referral depending on how he does with Home exercise progrom

## 2014-03-10 ENCOUNTER — Telehealth: Payer: Self-pay | Admitting: Family Medicine

## 2014-03-10 NOTE — Telephone Encounter (Signed)
Timothy Martinez is calling about the reaction he had after taking a Tramadol tablet.  Caused severe vomiting and he would like to have something else prescribed.  Please call back to advise.

## 2014-03-11 NOTE — Telephone Encounter (Signed)
Pt is calling back because he has no response from anyone and would like to know what he should do. jw

## 2014-03-13 ENCOUNTER — Ambulatory Visit (INDEPENDENT_AMBULATORY_CARE_PROVIDER_SITE_OTHER): Payer: Medicare Other | Admitting: Pharmacist

## 2014-03-13 ENCOUNTER — Encounter: Payer: Self-pay | Admitting: Pharmacist

## 2014-03-13 VITALS — BP 139/73 | HR 83 | Ht 73.0 in | Wt 193.3 lb

## 2014-03-13 DIAGNOSIS — J449 Chronic obstructive pulmonary disease, unspecified: Secondary | ICD-10-CM | POA: Diagnosis not present

## 2014-03-13 DIAGNOSIS — F17200 Nicotine dependence, unspecified, uncomplicated: Secondary | ICD-10-CM | POA: Diagnosis not present

## 2014-03-13 DIAGNOSIS — Z720 Tobacco use: Secondary | ICD-10-CM

## 2014-03-13 MED ORDER — RANITIDINE HCL 150 MG PO TABS: 150.0000 mg | ORAL_TABLET | Freq: Two times a day (BID) | ORAL | Status: DC

## 2014-03-13 MED ORDER — ATORVASTATIN CALCIUM 40 MG PO TABS: ORAL_TABLET | ORAL | Status: DC

## 2014-03-13 MED ORDER — OXYCODONE-ACETAMINOPHEN 5-325 MG PO TABS: ORAL_TABLET | ORAL | Status: DC

## 2014-03-13 MED ORDER — MOMETASONE FURO-FORMOTEROL FUM 200-5 MCG/ACT IN AERO: 2.0000 | INHALATION_SPRAY | Freq: Two times a day (BID) | RESPIRATORY_TRACT | Status: DC

## 2014-03-13 NOTE — Telephone Encounter (Signed)
According to allergies he CAN tolerate oxycodone and hydromorphone. I see nothing about hydrocodone, so I will give oxycodone Timothy Martinez

## 2014-03-13 NOTE — Patient Instructions (Addendum)
Dear Timothy Martinez,  Spirometry results today showed mild obstruction with some reversal when given albuterol.    Congratulations on cutting down to 1 pack per day.   We discussed cutting down to 10 cigarettes per day and think this is a great goal for the next month. Please consider quitting in the near future.   Please use the [facility] daily: 2 puffs in the morning and 2 puffs in the evening.  This is not a rescue medication like albuterol, so if you are having symptoms in the middle of the day, you can take albuterol then.   Dr. Valentina Lucks would like to follow up with you in 1 month.  Please make an appointment for sometime in September.

## 2014-03-13 NOTE — Assessment & Plan Note (Signed)
severe Nicotine Dependence of 50 years duration in a patient who is good candidate for success b/c of sister's death d/t lung cancer, previous attempt to quit, and willingness to cut down on cigarette use.   Pt agreed in the next month to attempt to cut down to 10 cigarettes per day and is going to keep a log of how many he uses per day.

## 2014-03-13 NOTE — Progress Notes (Signed)
Patient ID: Timothy Martinez, male   DOB: [birth year], 63 y.o.   MRN: [id number] Reviewed: Agree with the documentation and management by Dr. Valentina Lucks.

## 2014-03-13 NOTE — Assessment & Plan Note (Addendum)
Patient has been experiencing SOB and cough for at least 2 years and taking albuterol as needed (~1x/wk). Spirometry evaluation reveals mild obstructive lung disease corresponding to GOLD Classification A/B based on spirometry,  0 exacerbations in the past year, CAT score of 27, and mMRC 2. Post nebulized albuterol tx revealed 17% reversibility.  Reviewed results of pulmonary function tests.  begin Dulera, 2 puffs bid and continue albuterol prn at this time.  Educated patient on purpose, proper use, potential adverse effects including risk of esophageal candidiasis and need to rinse mouth after each use.  Pt verbalized understanding of results and education.  Written pt instructions provided.

## 2014-03-13 NOTE — Telephone Encounter (Signed)
Pt is here today for an OV with Dr. Valentina Lucks and ask about the medication.  Dr. Berkley Harvey paged and returned call.  He is ok with someone writing a Rx for Norco 5-325 take 0.5 - 1 tablet every 8 hours PRN.  # 10 with no refills.  Dr. Nori Riis has agreed to do so.  She will write and I will give Rx to pt while he is in clinic today. Pt informed to keep appt with Dr. Berkley Harvey on 03/25/14. Labrian Torregrossa, Salome Spotted

## 2014-03-13 NOTE — Progress Notes (Signed)
S:    Patient arrives in good spirits.    Presents for lung function evaluation.  Previously testing in October 2013. Patient reports breathing has been poor. Pt reports sister died of lung cancer within the past 2 years.  Age when started using tobacco on a daily basis 13 yrs. Number of Cigarettes per day 20. Estimated Nicotine Content per Cigarette (mg) 1.  Estimated Nicotine intake per day 20 mg.   Most recent quit attempt 2 years ago Longest time ever been tobacco free 42 days.  O: mMRC score= 2 CAT score= 27 See Documentation Flowsheet - CAT/COPD for complete symptom scoring. See "scanned report" or Documentation Flowsheet (discrete results - PFTs) for  Spirometry results. Patient provided good effort while attempting spirometry.   Lung Age = 28 Albuterol Neb  Lot# [id number]     Exp. 07/2015  A/P: severe Nicotine Dependence of 50 years duration in a patient who is good candidate for success b/c of sister's death d/t lung cancer, previous attempt to quit, and willingness to cut down on cigarette use.   Pt agreed in the next month to attempt to cut down to 10 cigarettes per day and is going to keep a log of how many he smokes per day.   Patient has been experiencing SOB and cough for at least 2 years and taking albuterol as needed (~1x/wk). Spirometry evaluation reveals mild obstructive lung disease corresponding to GOLD Classification A/B based on spirometry,  0 exacerbations in the past year, CAT score of 27, and mMRC 2. Post nebulized albuterol tx revealed 17% reversibility.  Reviewed results of pulmonary function tests.  begin Dulera, 2 puffs bid and continue albuterol prn at this time.  Educated patient on purpose, proper use, potential adverse effects including risk of esophageal candidiasis and need to rinse mouth after each use.  Pt verbalized understanding of results and education.  Written pt instructions provided.    Reordered ranitidine and atorvastatin prescriptions.   F/U  Clinic visit 1 month.   Total time in face to face counseling 30 minutes.  Patient seen with Kurtis Bushman, PharmD Candidate.

## 2014-03-13 NOTE — Telephone Encounter (Signed)
Pt given Rx.  Very appreciative. Timothy Martinez, Salome Spotted

## 2014-03-25 ENCOUNTER — Ambulatory Visit: Payer: Medicare Other | Admitting: Family Medicine

## 2014-04-17 ENCOUNTER — Ambulatory Visit: Payer: Medicare Other | Admitting: Pharmacist

## 2014-07-31 ENCOUNTER — Other Ambulatory Visit: Payer: Self-pay | Admitting: *Deleted

## 2014-07-31 MED ORDER — RANITIDINE HCL 150 MG PO TABS: 150.0000 mg | ORAL_TABLET | Freq: Two times a day (BID) | ORAL | Status: DC

## 2014-07-31 MED ORDER — ATORVASTATIN CALCIUM 40 MG PO TABS: ORAL_TABLET | ORAL | Status: DC

## 2014-08-04 ENCOUNTER — Encounter (HOSPITAL_COMMUNITY): Payer: Self-pay | Admitting: Emergency Medicine

## 2014-08-04 ENCOUNTER — Emergency Department (HOSPITAL_COMMUNITY): Payer: Medicare Other

## 2014-08-04 ENCOUNTER — Inpatient Hospital Stay (HOSPITAL_COMMUNITY)
Admission: EM | Admit: 2014-08-04 | Discharge: 2014-08-10 | DRG: 439 | Disposition: A | Discharge status: Home / Self Care | Payer: Medicare Other | Attending: Family Medicine | Admitting: Family Medicine

## 2014-08-04 DIAGNOSIS — J449 Chronic obstructive pulmonary disease, unspecified: Secondary | ICD-10-CM | POA: Diagnosis present

## 2014-08-04 DIAGNOSIS — I4892 Unspecified atrial flutter: Secondary | ICD-10-CM | POA: Insufficient documentation

## 2014-08-04 DIAGNOSIS — R1030 Lower abdominal pain, unspecified: Secondary | ICD-10-CM | POA: Diagnosis not present

## 2014-08-04 DIAGNOSIS — K861 Other chronic pancreatitis: Secondary | ICD-10-CM | POA: Diagnosis present

## 2014-08-04 DIAGNOSIS — E119 Type 2 diabetes mellitus without complications: Secondary | ICD-10-CM | POA: Insufficient documentation

## 2014-08-04 DIAGNOSIS — M545 Low back pain: Secondary | ICD-10-CM | POA: Diagnosis not present

## 2014-08-04 DIAGNOSIS — R109 Unspecified abdominal pain: Secondary | ICD-10-CM | POA: Diagnosis present

## 2014-08-04 DIAGNOSIS — F17210 Nicotine dependence, cigarettes, uncomplicated: Secondary | ICD-10-CM | POA: Diagnosis present

## 2014-08-04 DIAGNOSIS — K858 Other acute pancreatitis: Secondary | ICD-10-CM | POA: Diagnosis not present

## 2014-08-04 DIAGNOSIS — K859 Acute pancreatitis without necrosis or infection, unspecified: Secondary | ICD-10-CM

## 2014-08-04 DIAGNOSIS — J90 Pleural effusion, not elsewhere classified: Secondary | ICD-10-CM | POA: Diagnosis not present

## 2014-08-04 DIAGNOSIS — K219 Gastro-esophageal reflux disease without esophagitis: Secondary | ICD-10-CM | POA: Diagnosis present

## 2014-08-04 DIAGNOSIS — K2970 Gastritis, unspecified, without bleeding: Secondary | ICD-10-CM | POA: Diagnosis not present

## 2014-08-04 DIAGNOSIS — Z720 Tobacco use: Secondary | ICD-10-CM | POA: Diagnosis not present

## 2014-08-04 DIAGNOSIS — I5022 Chronic systolic (congestive) heart failure: Secondary | ICD-10-CM | POA: Diagnosis present

## 2014-08-04 DIAGNOSIS — E785 Hyperlipidemia, unspecified: Secondary | ICD-10-CM | POA: Diagnosis present

## 2014-08-04 DIAGNOSIS — Z7982 Long term (current) use of aspirin: Secondary | ICD-10-CM | POA: Diagnosis not present

## 2014-08-04 DIAGNOSIS — R110 Nausea: Secondary | ICD-10-CM | POA: Diagnosis not present

## 2014-08-04 DIAGNOSIS — I4891 Unspecified atrial fibrillation: Secondary | ICD-10-CM | POA: Diagnosis present

## 2014-08-04 DIAGNOSIS — I959 Hypotension, unspecified: Secondary | ICD-10-CM | POA: Diagnosis not present

## 2014-08-04 DIAGNOSIS — J9809 Other diseases of bronchus, not elsewhere classified: Secondary | ICD-10-CM | POA: Diagnosis not present

## 2014-08-04 DIAGNOSIS — Z7951 Long term (current) use of inhaled steroids: Secondary | ICD-10-CM

## 2014-08-04 DIAGNOSIS — J42 Unspecified chronic bronchitis: Secondary | ICD-10-CM | POA: Diagnosis not present

## 2014-08-04 DIAGNOSIS — F1010 Alcohol abuse, uncomplicated: Secondary | ICD-10-CM | POA: Diagnosis present

## 2014-08-04 DIAGNOSIS — Z79899 Other long term (current) drug therapy: Secondary | ICD-10-CM | POA: Diagnosis not present

## 2014-08-04 DIAGNOSIS — I369 Nonrheumatic tricuspid valve disorder, unspecified: Secondary | ICD-10-CM | POA: Diagnosis not present

## 2014-08-04 DIAGNOSIS — I429 Cardiomyopathy, unspecified: Secondary | ICD-10-CM | POA: Diagnosis present

## 2014-08-04 DIAGNOSIS — R079 Chest pain, unspecified: Secondary | ICD-10-CM | POA: Insufficient documentation

## 2014-08-04 DIAGNOSIS — I7090 Unspecified atherosclerosis: Secondary | ICD-10-CM | POA: Diagnosis not present

## 2014-08-04 DIAGNOSIS — K863 Pseudocyst of pancreas: Secondary | ICD-10-CM | POA: Diagnosis not present

## 2014-08-04 DIAGNOSIS — R0789 Other chest pain: Secondary | ICD-10-CM | POA: Diagnosis not present

## 2014-08-04 HISTORY — DX: Acute pancreatitis without necrosis or infection, unspecified: K85.90

## 2014-08-04 LAB — RAPID URINE DRUG SCREEN, HOSP PERFORMED
Amphetamines: NOT DETECTED
Barbiturates: NOT DETECTED
Benzodiazepines: NOT DETECTED
Cocaine: NOT DETECTED
Opiates: POSITIVE — AB
Tetrahydrocannabinol: NOT DETECTED

## 2014-08-04 LAB — CBC WITH DIFFERENTIAL/PLATELET
Basophils Absolute: 0.1 10*3/uL (ref 0.0–0.1)
Basophils Relative: 1 % (ref 0–1)
Eosinophils Absolute: 0.2 10*3/uL (ref 0.0–0.7)
Eosinophils Relative: 2 % (ref 0–5)
HCT: 48.4 % (ref 39.0–52.0)
Hemoglobin: 16.8 g/dL (ref 13.0–17.0)
Lymphocytes Relative: 31 % (ref 12–46)
Lymphs Abs: 2.6 10*3/uL (ref 0.7–4.0)
MCH: 31.6 pg (ref 26.0–34.0)
MCHC: 34.7 g/dL (ref 30.0–36.0)
MCV: 91 fL (ref 78.0–100.0)
Monocytes Absolute: 1 10*3/uL (ref 0.1–1.0)
Monocytes Relative: 12 % (ref 3–12)
Neutro Abs: 4.7 10*3/uL (ref 1.7–7.7)
Neutrophils Relative %: 54 % (ref 43–77)
Platelets: 236 10*3/uL (ref 150–400)
RBC: 5.32 MIL/uL (ref 4.22–5.81)
RDW: 13.8 % (ref 11.5–15.5)
WBC: 8.5 10*3/uL (ref 4.0–10.5)

## 2014-08-04 LAB — COMPREHENSIVE METABOLIC PANEL
ALT: 30 U/L (ref 0–53)
AST: 39 U/L — ABNORMAL HIGH (ref 0–37)
Albumin: 3.9 g/dL (ref 3.5–5.2)
Alkaline Phosphatase: 80 U/L (ref 39–117)
Anion gap: 5 (ref 5–15)
BUN: 15 mg/dL (ref 6–23)
CO2: 23 mmol/L (ref 19–32)
Calcium: 9.1 mg/dL (ref 8.4–10.5)
Chloride: 105 mEq/L (ref 96–112)
Creatinine, Ser: 1.02 mg/dL (ref 0.50–1.35)
GFR calc Af Amer: 89 mL/min — ABNORMAL LOW (ref >90)
GFR calc non Af Amer: 77 mL/min — ABNORMAL LOW (ref >90)
Glucose, Bld: 143 mg/dL — ABNORMAL HIGH (ref 70–99)
Potassium: 5.2 mmol/L — ABNORMAL HIGH (ref 3.5–5.1)
Sodium: 133 mmol/L — ABNORMAL LOW (ref 135–145)
Total Bilirubin: 1.4 mg/dL — ABNORMAL HIGH (ref 0.3–1.2)
Total Protein: 7.6 g/dL (ref 6.0–8.3)

## 2014-08-04 LAB — I-STAT CHEM 8, ED
BUN: 21 mg/dL (ref 6–23)
Calcium, Ion: 1.17 mmol/L (ref 1.13–1.30)
Chloride: 103 mEq/L (ref 96–112)
Creatinine, Ser: 0.9 mg/dL (ref 0.50–1.35)
Glucose, Bld: 147 mg/dL — ABNORMAL HIGH (ref 70–99)
HCT: 55 % — ABNORMAL HIGH (ref 39.0–52.0)
Hemoglobin: 18.7 g/dL — ABNORMAL HIGH (ref 13.0–17.0)
Potassium: 5 mmol/L (ref 3.5–5.1)
Sodium: 138 mmol/L (ref 135–145)
TCO2: 22 mmol/L (ref 0–100)

## 2014-08-04 LAB — PROTIME-INR
INR: 1.07 (ref 0.00–1.49)
Prothrombin Time: 14.1 seconds (ref 11.6–15.2)

## 2014-08-04 LAB — TROPONIN I: Troponin I: <0.03 ng/mL (ref <0.031)

## 2014-08-04 LAB — I-STAT CG4 LACTIC ACID, ED: Lactic Acid, Venous: 1.48 mmol/L (ref 0.5–2.2)

## 2014-08-04 LAB — LIPASE, BLOOD: Lipase: 45 U/L (ref 11–59)

## 2014-08-04 MED ORDER — HEPARIN BOLUS VIA INFUSION
4000.0000 [IU] | Freq: Once | INTRAVENOUS | Status: AC
Start: 2014-08-04 — End: 2014-08-04
  Administered 2014-08-04: 4000 [IU] via INTRAVENOUS
  Filled 2014-08-04: qty 4000

## 2014-08-04 MED ORDER — ALBUTEROL SULFATE HFA 108 (90 BASE) MCG/ACT IN AERS
2.0000 | INHALATION_SPRAY | Freq: Once | RESPIRATORY_TRACT | Status: AC
  Administered 2014-08-04: 2 via RESPIRATORY_TRACT
  Filled 2014-08-04: qty 6.7

## 2014-08-04 MED ORDER — LORAZEPAM 2 MG/ML IJ SOLN
2.0000 mg | INTRAMUSCULAR | Status: DC | PRN
  Administered 2014-08-06 – 2014-08-07 (×3): 2 mg via INTRAVENOUS
  Administered 2014-08-08: 3 mg via INTRAVENOUS
  Filled 2014-08-04: qty 1
  Filled 2014-08-04: qty 2
  Filled 2014-08-04 (×2): qty 1

## 2014-08-04 MED ORDER — SODIUM CHLORIDE 0.9 % IV BOLUS (SEPSIS)
1000.0000 mL | Freq: Once | INTRAVENOUS | Status: AC
  Administered 2014-08-04: 1000 mL via INTRAVENOUS

## 2014-08-04 MED ORDER — HYDROMORPHONE HCL 1 MG/ML IJ SOLN
1.0000 mg | Freq: Once | INTRAMUSCULAR | Status: AC
  Administered 2014-08-04: 1 mg via INTRAVENOUS
  Filled 2014-08-04: qty 1

## 2014-08-04 MED ORDER — ALBUTEROL SULFATE (2.5 MG/3ML) 0.083% IN NEBU: 3.0000 mL | INHALATION_SOLUTION | Freq: Every evening | RESPIRATORY_TRACT | Status: DC | PRN

## 2014-08-04 MED ORDER — SODIUM CHLORIDE 0.9 % IV SOLN
INTRAVENOUS | Status: DC
  Administered 2014-08-05 (×2): via INTRAVENOUS
  Administered 2014-08-05: 1000 mL via INTRAVENOUS

## 2014-08-04 MED ORDER — DILTIAZEM HCL 100 MG IV SOLR
5.0000 mg/h | Freq: Once | INTRAVENOUS | Status: AC
  Administered 2014-08-04: 5 mg/h via INTRAVENOUS

## 2014-08-04 MED ORDER — ACETAMINOPHEN 650 MG RE SUPP: 650.0000 mg | Freq: Four times a day (QID) | RECTAL | Status: DC | PRN

## 2014-08-04 MED ORDER — ATORVASTATIN CALCIUM 40 MG PO TABS
40.0000 mg | ORAL_TABLET | Freq: Every day | ORAL | Status: DC
  Administered 2014-08-05 – 2014-08-09 (×5): 40 mg via ORAL
  Filled 2014-08-04 (×6): qty 1

## 2014-08-04 MED ORDER — HEPARIN (PORCINE) IN NACL 100-0.45 UNIT/ML-% IJ SOLN
1300.0000 [IU]/h | INTRAMUSCULAR | Status: DC
  Administered 2014-08-04: 1300 [IU]/h via INTRAVENOUS
  Filled 2014-08-04: qty 250

## 2014-08-04 MED ORDER — FOLIC ACID 5 MG/ML IJ SOLN
1.0000 mg | Freq: Every day | INTRAMUSCULAR | Status: DC
  Administered 2014-08-05 – 2014-08-07 (×3): 1 mg via INTRAVENOUS
  Filled 2014-08-04 (×4): qty 0.2

## 2014-08-04 MED ORDER — MOMETASONE FURO-FORMOTEROL FUM 200-5 MCG/ACT IN AERO
2.0000 | INHALATION_SPRAY | Freq: Two times a day (BID) | RESPIRATORY_TRACT | Status: DC
  Administered 2014-08-05 – 2014-08-10 (×11): 2 via RESPIRATORY_TRACT
  Filled 2014-08-04: qty 8.8

## 2014-08-04 MED ORDER — ADENOSINE 6 MG/2ML IV SOLN
6.0000 mg | Freq: Once | INTRAVENOUS | Status: AC
  Administered 2014-08-04: 6 mg via INTRAVENOUS
  Filled 2014-08-04: qty 2

## 2014-08-04 MED ORDER — ONDANSETRON HCL 4 MG PO TABS: 4.0000 mg | ORAL_TABLET | Freq: Four times a day (QID) | ORAL | Status: DC | PRN

## 2014-08-04 MED ORDER — ASPIRIN EC 81 MG PO TBEC
81.0000 mg | DELAYED_RELEASE_TABLET | Freq: Every day | ORAL | Status: DC
Start: 2014-08-05 — End: 2014-08-10
  Administered 2014-08-05 – 2014-08-10 (×6): 81 mg via ORAL
  Filled 2014-08-04 (×6): qty 1

## 2014-08-04 MED ORDER — ONDANSETRON HCL 4 MG/2ML IJ SOLN: 4.0000 mg | Freq: Four times a day (QID) | INTRAMUSCULAR | Status: DC | PRN

## 2014-08-04 MED ORDER — DILTIAZEM HCL 100 MG IV SOLR
5.0000 mg/h | INTRAVENOUS | Status: DC
  Administered 2014-08-05: 12.5 mg/h via INTRAVENOUS
  Administered 2014-08-05: 5 mg/h via INTRAVENOUS
  Administered 2014-08-05: 7.5 mg/h via INTRAVENOUS
  Filled 2014-08-04 (×4): qty 100

## 2014-08-04 MED ORDER — HYDROMORPHONE HCL 1 MG/ML IJ SOLN
0.5000 mg | INTRAMUSCULAR | Status: DC | PRN
  Administered 2014-08-05 – 2014-08-06 (×8): 0.5 mg via INTRAVENOUS
  Filled 2014-08-04 (×8): qty 1

## 2014-08-04 MED ORDER — IOHEXOL 300 MG/ML  SOLN
100.0000 mL | Freq: Once | INTRAMUSCULAR | Status: AC | PRN
  Administered 2014-08-04: 100 mL via INTRAVENOUS

## 2014-08-04 MED ORDER — DILTIAZEM HCL 25 MG/5ML IV SOLN
15.0000 mg | Freq: Once | INTRAVENOUS | Status: AC
  Administered 2014-08-04: 15 mg via INTRAVENOUS
  Filled 2014-08-04: qty 5

## 2014-08-04 MED ORDER — SODIUM CHLORIDE 0.9 % IJ SOLN
3.0000 mL | Freq: Two times a day (BID) | INTRAMUSCULAR | Status: DC
  Administered 2014-08-05 – 2014-08-10 (×12): 3 mL via INTRAVENOUS

## 2014-08-04 MED ORDER — HEPARIN SODIUM (PORCINE) 5000 UNIT/ML IJ SOLN
5000.0000 [IU] | Freq: Three times a day (TID) | INTRAMUSCULAR | Status: DC
  Administered 2014-08-05 – 2014-08-10 (×15): 5000 [IU] via SUBCUTANEOUS
  Filled 2014-08-04 (×23): qty 1

## 2014-08-04 MED ORDER — THIAMINE HCL 100 MG/ML IJ SOLN
100.0000 mg | Freq: Every day | INTRAMUSCULAR | Status: DC
  Administered 2014-08-05 – 2014-08-07 (×3): 100 mg via INTRAVENOUS
  Filled 2014-08-04 (×4): qty 1

## 2014-08-04 MED ORDER — ACETAMINOPHEN 325 MG PO TABS
650.0000 mg | ORAL_TABLET | Freq: Four times a day (QID) | ORAL | Status: DC | PRN
  Administered 2014-08-06 (×2): 650 mg via ORAL
  Filled 2014-08-04 (×2): qty 2

## 2014-08-04 MED ORDER — SODIUM CHLORIDE 0.9 % IV SOLN
1000.0000 mL | Freq: Once | INTRAVENOUS | Status: AC
  Administered 2014-08-04: 1000 mL via INTRAVENOUS

## 2014-08-04 MED ORDER — IOHEXOL 300 MG/ML  SOLN
25.0000 mL | INTRAMUSCULAR | Status: AC
  Administered 2014-08-04: 25 mL via ORAL

## 2014-08-04 NOTE — H&P (Signed)
[facility] Admission History and Physical Service Pager: [phone]  Patient name: Timothy Martinez Medical record number: [id number] Date of birth: 26-Jan-1951 Age: 64 y.o. Gender: male  Primary Care Provider: Phill Myron, MD Consultants: none Code Status: Full  Chief Complaint: abdominal pain  Assessment and Plan: Timothy Martinez is a 64 y.o. male presenting with 3 day history of abdominal pain. PMH is significant for hx pancreatitis secondary to ethanol abuse, COPD, DM, HLD, GERD.  # Suspected acute on chronic pancreatitis: initial location of pain not consistent but on exam pt is more tender in epigastrium. CT scan of abdomen noted for intramural pancreatic pseudocyst, inflammtory changes; but noted to have normal lipase. Vitals tachycardia, better controlled on diltiazem - admit to SDU (for aflutter on dilt) - NPO - NS @ 150cc/hr - dilaudid 0.5mg  q3hrs PRN (morphine allergy) - cmet and cbc in AM  # Atrial flutter: developed tachycardia after CT. Asymptomatic per patient. Given adenosine and repeat EKG demonstrated flutter waves. ED started on heparin.  - diltiazem drip - discontinue heparin, discuss anticoagulation in AM - cycle troponins - echo  # Alcohol abuse: UDS positive for opiates (noted to have received this as prescription, last filled in August per Epic review) - CIWA protocol  # DM: last a1c 07/2013 6.3.  - CBG monitoring - repeat a1c in AM  FEN/GI: NPO / NS @ 150cc/hr Prophylaxis: heparin sq  Disposition: SDU for diltiazem titration  History of Present Illness: Timothy Martinez is a 64 y.o. male presenting with 3 day history of lower abdominal pain. States pain started below his belly button in the center, and over the past several days has radiated around into his lower back. Pain is sharp, has been constant, made worse with movement and relieved by staying still. He also reports that around 1pm this afternoon he had a small amount  of blood in his stool. He denies any diarrhea or constipation. He denies dysuria, changes in color or odor of urine. He admits to a history of pancreatitis and did not think this was what was going on, but after the ED physician started pushing on his abdomen he developed worsened epigastric pain. While in the ED also developed tachycardia but pt denies any sensation of this, no heart palpitations. Denies CP, SOB, fevers or chills, tremors. Says he has had one withdrawal from alcohol in the past. He drinks 2-6 beers a night, last drank 3 nights ago with 5 beers.  Review Of Systems: Per HPI with the following additions: none Otherwise 12 point review of systems was performed and was unremarkable.  Patient Active Problem List   Diagnosis Date Noted  . Pancreatitis 08/04/2014  . Left foot pain 10/12/2013  . Low back pain 12/21/2012  . Abdominal pain, unspecified site 09/28/2012  . Polyuria 08/21/2012  . COPD (chronic obstructive pulmonary disease) 04/27/2012  . GERD (gastroesophageal reflux disease) 03/05/2012  . Alcohol abuse 06/09/2011  . Diabetes Mellitus Type II 05/30/2011  . Chronic alcoholic pancreatitis 32/67/1245  . Hyperlipidemia 05/30/2011  . Tobacco abuse 05/30/2011   Past Medical History: Past Medical History  Diagnosis Date  . Pancreas disorder   . Diabetes mellitus   . Hyperlipidemia   . Shortness of breath     with ambulation  . Anginal pain   . Pneumonia   . GERD (gastroesophageal reflux disease)   . Headache(784.0)   . Arthritis    Past Surgical History: Past Surgical History  Procedure Laterality Date  .  Back surgery    . Neck surgery      not cervical, states had lesion on neck which was removed  . Splenectomy     Social History: History  Substance Use Topics  . Smoking status: Current Every Day Smoker -- 1.00 packs/day for 50 years    Types: Cigarettes    Start date: 04/09/1964  . Smokeless tobacco: Never Used     Comment: trying to quit. smoked 3 ppd  before  . Alcohol Use: 0.0 oz/week    6-7 Cans of beer per week     Comment: "a beer once in a while"   Additional social history: drinks 2-6 beers a day Please also refer to relevant sections of EMR.  Family History: Family History  Problem Relation Age of Onset  . Diabetes Mother   . Aneurysm Mother   . Alcohol abuse Father    Allergies and Medications: Allergies  Allergen Reactions  . Tramadol Nausea And Vomiting  . Morphine And Related Swelling    Pt states he can tolerate oxycodone and hydromorphone  . Penicillins Rash   No current facility-administered medications on file prior to encounter.   Current Outpatient Prescriptions on File Prior to Encounter  Medication Sig Dispense Refill  . albuterol (PROVENTIL HFA;VENTOLIN HFA) 108 (90 BASE) MCG/ACT inhaler Inhale 2 puffs into the lungs at bedtime as needed for wheezing or shortness of breath.    Marland Kitchen aspirin EC 81 MG tablet Take 81 mg by mouth daily.    Marland Kitchen atorvastatin (LIPITOR) 40 MG tablet TAKE 1 TABLET EVERY DAY 30 tablet 0  . mometasone-formoterol (DULERA) 200-5 MCG/ACT AERO Inhale 2 puffs into the lungs 2 (two) times daily. 2 Inhaler 0  . ranitidine (ZANTAC) 150 MG tablet Take 1 tablet (150 mg total) by mouth 2 (two) times daily. 60 tablet 0  . baclofen (LIORESAL) 10 MG tablet Take 1 tablet (10 mg total) by mouth as needed for muscle spasms (1 pill at night). (Patient not taking: Reported on 08/04/2014) 5 each 0  . oxyCODONE-acetaminophen (ROXICET) 5-325 MG per tablet Take one half or one tablet twice daily as needed for pain (Patient not taking: Reported on 08/04/2014) 10 tablet 0  . ranitidine (ZANTAC) 150 MG tablet Take 1 tablet (150 mg total) by mouth 2 (two) times daily. 60 tablet 11    Objective: BP 114/65 mmHg  Pulse 61  Temp(Src) 98.2 F (36.8 C) (Oral)  Resp 27  Ht 6\' 3"  (1.905 m)  Wt 198 lb (89.812 kg)  BMI 24.75 kg/m2  SpO2 92% Exam: General: NAD, sitting in bed appears comfortable HEENT: Pupils  constricted bilaterally but reactive to light, MMM. Cardiovascular: tachycardia, irregular rhythm, normal heart sounds, no murmur appreciated. 2+ radial and PT pulse bilaterally Respiratory: CTAB, normal effort. No CVA tenderness Abdomen: soft, tender primarily epigstrium and RUQ, below umbilicus less tender, voluntary guarding, no rebound. Bowel sounds normal Extremities: no edema or cyanosis. WWP. Skin: no rashes Neuro: alert and oriented, no focal deficits.  Labs and Imaging: CBC BMET   Recent Labs Lab 08/04/14 1608 08/04/14 1651  WBC 8.5  --   HGB 16.8 18.7*  HCT 48.4 55.0*  PLT 236  --     Recent Labs Lab 08/04/14 1608 08/04/14 1651  NA 133* 138  K 5.2* 5.0  CL 105 103  CO2 23  --   BUN 15 21  CREATININE 1.02 0.90  GLUCOSE 143* 147*  CALCIUM 9.1  --      Ct Abdomen  Pelvis W Contrast  08/04/2014   CLINICAL DATA:  Diffuse lower abdominal pain radiating to the back with nausea and vomiting for 5 days.  EXAM: CT ABDOMEN AND PELVIS WITH CONTRAST  TECHNIQUE: Multidetector CT imaging of the abdomen and pelvis was performed using the standard protocol following bolus administration of intravenous contrast.  CONTRAST:  158mL OMNIPAQUE IOHEXOL 300 MG/ML  SOLN  COMPARISON:  04/25/2013  FINDINGS: There is a 3.6 x 2.5 x 2.1 cm intramural pseudocyst in the anterior wall of the second portion of the duodenum adjacent to the pancreatic head. There is soft tissue stranding into the adjacent soft tissues extending toward the hepatic flexure of the colon consistent with pancreatitis.  There is chronic marked dilatation of the pancreatic duct to a diameter of 10 mm. Previous splenectomy.  Liver demonstrates patchy hepatic steatosis in the right posterior and inferior aspects of the right lobe. Biliary tree is normal.  The adrenal glands and kidneys are normal except for chronic perinephric soft tissue stranding around the each kidney.  The bowel is normal the in the abnormality in the second  portion the duodenum. Slight atheromatous disease of the abdominal aorta and common iliac arteries.  Bladder and prostate gland appear normal. No adenopathy. No acute osseous abnormalities.  Severe chronic interstitial and obstructive lung disease, stable. Stable 6 mm nodule at the left lung base.  IMPRESSION: 1. Intramural pancreatic pseudocyst in the second portion of the duodenum. Slight inflammatory changes in the adjacent soft tissues are consistent with pancreatitis or inflammation secondary to this cyst. 2. Chronic pancreatitis with chronic dilatation of the pancreatic duct. 3. Severe chronic lung disease.   Electronically Signed   By: Rozetta Nunnery M.D.   On: 08/04/2014 19:01   Leone Brand, MD 08/04/2014, 10:46 PM PGY-2, [facility] Intern pager: [phone], text pages welcome

## 2014-08-04 NOTE — Progress Notes (Signed)
ANTICOAGULATION CONSULT NOTE - Initial Consult  Pharmacy Consult for Heparin Indication: atrial fibrillation  Allergies  Allergen Reactions  . Tramadol Nausea And Vomiting  . Morphine And Related Swelling    Pt states he can tolerate oxycodone and hydromorphone  . Penicillins Rash    Patient Measurements: Height: 6\' 3"  (190.5 cm) Weight: 198 lb (89.812 kg) IBW/kg (Calculated) : 84.5 Heparin Dosing Weight: 89.8 kg  Vital Signs: Temp: 98.2 F (36.8 C) (01/11 1552) Temp Source: Oral (01/11 1552) BP: 115/86 mmHg (01/11 2000) Pulse Rate: 146 (01/11 2000)  Labs:  Recent Labs  08/04/14 1608 08/04/14 1651  HGB 16.8 18.7*  HCT 48.4 55.0*  PLT 236  --   LABPROT 14.1  --   INR 1.07  --   CREATININE 1.02 0.90    Estimated Creatinine Clearance: 101.7 mL/min (by C-G formula based on Cr of 0.9).   Medical History: Past Medical History  Diagnosis Date  . Pancreas disorder   . Diabetes mellitus   . Hyperlipidemia   . Shortness of breath     with ambulation  . Anginal pain   . Pneumonia   . GERD (gastroesophageal reflux disease)   . Headache(784.0)   . Arthritis     Medications:   (Not in a hospital admission) Scheduled:   Infusions:  . diltiazem (CARDIZEM) infusion    . diltiazem      Assessment: 64yo male with h/o DM2, HLD, and pancreatitis presents with abdominal pain. Pharmacy is consulted to dose heparin for atrial fibrillation. CBC is wnl.  Goal of Therapy:  Heparin level 0.3-0.7 units/ml Monitor platelets by anticoagulation protocol: Yes   Plan:  Give 4000 units bolus x 1 Start heparin infusion at 1300 units/hr Check anti-Xa level in 6 hours and daily while on heparin Continue to monitor H&H and platelets  Monitor s/sx of bleeding  Andrey Cota. Diona Foley, PharmD Clinical Pharmacist Pager [phone] 08/04/2014,8:10 PM

## 2014-08-04 NOTE — ED Notes (Signed)
CT notified of pt completing contrast

## 2014-08-04 NOTE — ED Provider Notes (Signed)
I saw and evaluated the patient, reviewed the resident's note and I agree with the findings and plan.   EKG Interpretation   Date/Time:  Monday August 04 2014 15:52:17 EST Ventricular Rate:  149 PR Interval:  128 QRS Duration: 80 QT Interval:  330 QTC Calculation: 520 R Axis:   66 Text Interpretation:  narrow complex regular tachycardia ST depression,  probably rate related Minimal ST elevation, lateral leads Prolonged QT  interval compared to prior, rate increased Confirmed by [facility]  MD, TREY  ([id number]) on 08/04/2014 5:37:06 PM        EKG Interpretation  Date/Time:  Monday August 04 2014 21:41:22 EST Ventricular Rate:  125 PR Interval:  128 QRS Duration: 133 QT Interval:  304 QTC Calculation: 438 R Axis:   84 Text Interpretation:  Atrial flutter Ventricular premature complex nonspecific ST changes Baseline wander in lead(s) III aVF V6 Compared to prior, flutter waves now clearly evident.   Confirmed by [facility]  MD, Lytle Michaels ([id number]) on 08/05/2014 12:46:22 AM      CRITICAL CARE Performed by: Arbie Cookey   Total critical care time: 35  Critical care time was exclusive of separately billable procedures and treating other patients.  Critical care was necessary to treat or prevent imminent or life-threatening deterioration.  Critical care was time spent personally by me on the following activities: development of treatment plan with patient and/or surrogate as well as nursing, discussions with consultants, evaluation of patient's response to treatment, examination of patient, obtaining history from patient or surrogate, ordering and performing treatments and interventions, ordering and review of laboratory studies, ordering and review of radiographic studies, pulse oximetry and re-evaluation of patient's condition.   64 yo male with hx of pancreatitis presenting with abdominal pain.  Started as lower abdominal pain 4 days ago, has spread around both sides to back and up  to epigastrium.  On exam, well appearing, nontoxic, not distressed, normal respiratory effort, normal perfusion, alert, oriented, tachycardia rate, regular rhythm, no murmurs, lungs CTAB, abdomen soft but tender diffusely, worse in suprapubic region, no R/R/G.  He is tachycardic of unclear origin, but not hypotensive.  Will treat with fluids and pain controls and monitor closely.    Despite adequate pain control and fluid resuscitation, heart rate remained in the 140s.   He required adenosine to further evaluate his rhythm. This demonstrated flutter waves consistent with a flutter. Treated with IV diltiazem bolus and drip, which improved rate. Admitted by family practice.  Clinical Impression: Acute on chronic pancreatitis A flutter with RVR   Houston Siren III, MD 08/05/14 [phone]

## 2014-08-04 NOTE — ED Notes (Signed)
PA notified that boluses are complete and HR is unchanged.

## 2014-08-04 NOTE — ED Provider Notes (Signed)
CSN: [id number]     Arrival date & time 08/04/14  1551 History   First MD Initiated Contact with Patient 08/04/14 1554     Chief Complaint  Patient presents with  . Abdominal Pain     (Consider location/radiation/quality/duration/timing/severity/associated sxs/prior Treatment) Patient is a 64 y.o. male presenting with abdominal pain.  Abdominal Pain Pain location:  RLQ and LLQ Pain quality: cramping   Pain radiates to:  Epigastric region Pain severity:  Severe Onset quality:  Gradual Duration:  4 days Timing:  Constant Progression:  Worsening Chronicity:  New (feels like pancreatitis) Relieved by:  Nothing Worsened by:  Nothing tried Ineffective treatments:  None tried Associated symptoms: anorexia, fatigue, nausea and shortness of breath (no change chronic COPD, out of inhaler)   Associated symptoms: no chest pain, no chills, no constipation, no cough, no diarrhea, no dysuria, no fever, no hematochezia (bright red blood on TP, small amount), no melena, no sore throat and no vomiting   Risk factors: alcohol abuse (drank 4 days ago, reports occasional alcohol use)   Risk factors comment:  Pancreatitis   Past Medical History  Diagnosis Date  . Pancreas disorder   . Diabetes mellitus   . Hyperlipidemia   . Shortness of breath     with ambulation  . Anginal pain   . Pneumonia   . GERD (gastroesophageal reflux disease)   . Headache(784.0)   . Arthritis    Past Surgical History  Procedure Laterality Date  . Back surgery    . Neck surgery      not cervical, states had lesion on neck which was removed  . Splenectomy     Family History  Problem Relation Age of Onset  . Diabetes Mother   . Aneurysm Mother   . Alcohol abuse Father    History  Substance Use Topics  . Smoking status: Current Every Day Smoker -- 1.00 packs/day for 50 years    Types: Cigarettes    Start date: 04/09/1964  . Smokeless tobacco: Never Used     Comment: trying to quit. smoked 3 ppd before   . Alcohol Use: 0.0 oz/week    6-7 Cans of beer per week     Comment: "a beer once in a while"    Review of Systems  Constitutional: Positive for fatigue. Negative for fever and chills.  HENT: Negative for sore throat.   Eyes: Negative for visual disturbance.  Respiratory: Positive for shortness of breath (no change chronic COPD, out of inhaler). Negative for cough.   Cardiovascular: Negative for chest pain.  Gastrointestinal: Positive for nausea, abdominal pain and anorexia. Negative for vomiting, diarrhea, constipation, melena and hematochezia (bright red blood on TP, small amount).  Genitourinary: Negative for dysuria and difficulty urinating.  Musculoskeletal: Negative for back pain and neck stiffness.  Skin: Negative for rash.  Neurological: Negative for syncope and headaches.      Allergies  Tramadol; Morphine and related; and Penicillins  Home Medications   Prior to Admission medications   Medication Sig Start Date End Date Taking? Authorizing Provider  albuterol (PROVENTIL HFA;VENTOLIN HFA) 108 (90 BASE) MCG/ACT inhaler Inhale 2 puffs into the lungs at bedtime as needed for wheezing or shortness of breath.    Historical Provider, MD  aspirin EC 81 MG tablet Take 81 mg by mouth daily.    Historical Provider, MD  atorvastatin (LIPITOR) 40 MG tablet TAKE 1 TABLET EVERY DAY 07/31/14   Nobie Putnam, DO  baclofen (LIORESAL) 10 MG tablet Take 1  tablet (10 mg total) by mouth as needed for muscle spasms (1 pill at night). 03/06/14   Olam Idler, MD  mometasone-formoterol [facility]) 200-5 MCG/ACT AERO Inhale 2 puffs into the lungs 2 (two) times daily. 03/13/14   Zigmund Gottron, MD  oxyCODONE-acetaminophen (ROXICET) 5-325 MG per tablet Take one half or one tablet twice daily as needed for pain 03/13/14   Dickie La, MD  ranitidine (ZANTAC) 150 MG tablet Take 1 tablet (150 mg total) by mouth 2 (two) times daily. 03/11/13 03/11/14  Marin Olp, MD  ranitidine (ZANTAC)  150 MG tablet Take 1 tablet (150 mg total) by mouth 2 (two) times daily. 07/31/14   Alexander Karamalegos, DO   BP 118/78 mmHg  Pulse 150  Temp(Src) 98.2 F (36.8 C) (Oral)  Resp 18  Ht 6\' 3"  (1.905 m)  Wt 198 lb (89.812 kg)  BMI 24.75 kg/m2  SpO2 100% Physical Exam  Constitutional: He is oriented to person, place, and time. He appears well-developed and well-nourished. No distress.  HENT:  Head: Normocephalic and atraumatic.  Eyes: Conjunctivae and EOM are normal.  Neck: Normal range of motion.  Cardiovascular: Regular rhythm, normal heart sounds and intact distal pulses.  Tachycardia present.  Exam reveals no gallop and no friction rub.   No murmur heard. Pulmonary/Chest: Effort normal and breath sounds normal. No respiratory distress. He has no wheezes. He has no rales.  Abdominal: Soft. He exhibits no distension. There is tenderness (diffuse). There is guarding.  Musculoskeletal: He exhibits no edema.  Neurological: He is alert and oriented to person, place, and time.  Skin: Skin is warm and dry. No rash noted. He is not diaphoretic.  Nursing note and vitals reviewed.   ED Course  Procedures (including critical care time) Labs Review Labs Reviewed  URINE RAPID DRUG SCREEN (HOSP PERFORMED)  CBC WITH DIFFERENTIAL  COMPREHENSIVE METABOLIC PANEL  PROTIME-INR  I-STAT CHEM 8, ED  I-STAT CG4 LACTIC ACID, ED    Imaging Review No results found.   EKG Interpretation None      MDM   Final diagnoses:  Lower abdominal pain   64 year old male with a history of diabetes, chronic alcoholic pancreatitis, acute pancreatitis, hyperlipidemia, COPD, chronic pain presents with concern of 4 days of lower cramping abdominal pain, nausea, and decreased po.  Patient tachycardic to the 150s on arrival to the emergency department with stable blood pressure. DDx for abdominal pain includes hepatitis, pancreatitis, mesenteric ischemia, appendicitis, diverticulitis, constipation, SBO.  Pt with  flatus, no vomiting doubt SBO.  Pt describes small amount of bright red blood on outside of stool, not mixed, denies black/bloody stool and doubt significant GI bleed by history.  CMP is close to patient's baseline, and lipase is normal however CT abdomen and pelvis showed intramural pancreatic pseudocyst with inflammatory changes in the adjacent soft tissues consistent with pancreatitis or inflammation secondary to the cyst.  Given dilaudid for pain.   Patient was given IV fluids for his tachycardia and decreased by mouth intake, however did not have a change in his heart rate.  He was given 6 mg of adenosine which showed evidence of underlying flutter waves.  He was then given a bolus of 15 mg of diltiazem and started on a drip with some improvement in his heart rate.  A heparin drip was started given concern of atrial flutter for anticoagulation.  Family medicine was consulted for further evaluation and management of his abdominal pain likely secondary to pancreatitis and new onset  atrial flutter.   Gareth Morgan, MD 08/05/14 [place] III, MD 08/06/14 [phone]

## 2014-08-04 NOTE — ED Notes (Signed)
Code cart set up at bedside, patient placed on cardiac Zoll pads.

## 2014-08-04 NOTE — ED Notes (Signed)
Per EMS: Pt was brought to the ED due to abdominal pain.  Pt has a hx of spleenectomy, and abdominal injury/trauma.  Pt sts his stomach was taut/firm yesterday, but has since resolved.  Pt sts he noticed blood in his stool.  Pt sts he had a "heat wave" prior to calling EMS that also included nausea.  Denies any vomiting.

## 2014-08-05 DIAGNOSIS — I369 Nonrheumatic tricuspid valve disorder, unspecified: Secondary | ICD-10-CM

## 2014-08-05 DIAGNOSIS — I4891 Unspecified atrial fibrillation: Secondary | ICD-10-CM

## 2014-08-05 DIAGNOSIS — J42 Unspecified chronic bronchitis: Secondary | ICD-10-CM

## 2014-08-05 DIAGNOSIS — K861 Other chronic pancreatitis: Secondary | ICD-10-CM

## 2014-08-05 DIAGNOSIS — R1030 Lower abdominal pain, unspecified: Secondary | ICD-10-CM

## 2014-08-05 DIAGNOSIS — I4892 Unspecified atrial flutter: Secondary | ICD-10-CM

## 2014-08-05 DIAGNOSIS — J449 Chronic obstructive pulmonary disease, unspecified: Secondary | ICD-10-CM | POA: Insufficient documentation

## 2014-08-05 DIAGNOSIS — I5022 Chronic systolic (congestive) heart failure: Secondary | ICD-10-CM

## 2014-08-05 DIAGNOSIS — K859 Acute pancreatitis, unspecified: Secondary | ICD-10-CM | POA: Diagnosis not present

## 2014-08-05 DIAGNOSIS — I429 Cardiomyopathy, unspecified: Secondary | ICD-10-CM | POA: Diagnosis not present

## 2014-08-05 DIAGNOSIS — I959 Hypotension, unspecified: Secondary | ICD-10-CM | POA: Diagnosis not present

## 2014-08-05 LAB — CBC
HCT: 46.7 % (ref 39.0–52.0)
Hemoglobin: 15.5 g/dL (ref 13.0–17.0)
MCH: 31.6 pg (ref 26.0–34.0)
MCHC: 33.2 g/dL (ref 30.0–36.0)
MCV: 95.1 fL (ref 78.0–100.0)
Platelets: 233 10*3/uL (ref 150–400)
RBC: 4.91 MIL/uL (ref 4.22–5.81)
RDW: 14.1 % (ref 11.5–15.5)
WBC: 10.5 10*3/uL (ref 4.0–10.5)

## 2014-08-05 LAB — LIPID PANEL
Cholesterol: 120 mg/dL (ref 0–200)
HDL: 27 mg/dL — ABNORMAL LOW (ref >39)
LDL Cholesterol: 72 mg/dL (ref 0–99)
Total CHOL/HDL Ratio: 4.4 RATIO
Triglycerides: 105 mg/dL (ref <150)
VLDL: 21 mg/dL (ref 0–40)

## 2014-08-05 LAB — COMPREHENSIVE METABOLIC PANEL
ALT: 21 U/L (ref 0–53)
AST: 21 U/L (ref 0–37)
Albumin: 3.1 g/dL — ABNORMAL LOW (ref 3.5–5.2)
Alkaline Phosphatase: 64 U/L (ref 39–117)
Anion gap: 5 (ref 5–15)
BUN: 10 mg/dL (ref 6–23)
CO2: 23 mmol/L (ref 19–32)
Calcium: 8.5 mg/dL (ref 8.4–10.5)
Chloride: 109 mEq/L (ref 96–112)
Creatinine, Ser: 0.88 mg/dL (ref 0.50–1.35)
GFR calc Af Amer: >90 mL/min (ref >90)
GFR calc non Af Amer: >90 mL/min (ref >90)
Glucose, Bld: 116 mg/dL — ABNORMAL HIGH (ref 70–99)
Potassium: 4.1 mmol/L (ref 3.5–5.1)
Sodium: 137 mmol/L (ref 135–145)
Total Bilirubin: 1 mg/dL (ref 0.3–1.2)
Total Protein: 6.7 g/dL (ref 6.0–8.3)

## 2014-08-05 LAB — MAGNESIUM: Magnesium: 1.9 mg/dL (ref 1.5–2.5)

## 2014-08-05 LAB — HEMOGLOBIN A1C
Hgb A1c MFr Bld: 7.5 % — ABNORMAL HIGH (ref <5.7)
Mean Plasma Glucose: 169 mg/dL — ABNORMAL HIGH (ref <117)

## 2014-08-05 LAB — TROPONIN I
Troponin I: <0.03 ng/mL (ref <0.031)
Troponin I: <0.03 ng/mL (ref <0.031)

## 2014-08-05 LAB — MRSA PCR SCREENING: MRSA by PCR: NEGATIVE

## 2014-08-05 MED ORDER — PANTOPRAZOLE SODIUM 40 MG IV SOLR
40.0000 mg | INTRAVENOUS | Status: DC
  Administered 2014-08-05 – 2014-08-07 (×3): 40 mg via INTRAVENOUS
  Filled 2014-08-05 (×4): qty 40

## 2014-08-05 NOTE — Discharge Summary (Signed)
[facility] Discharge Summary  Patient name: Timothy Martinez Medical record number: [id number] Date of birth: 1951/05/26 Age: 64 y.o. Gender: male Date of Admission: 08/04/2014  Date of Discharge: 08/10/14 Admitting Physician: Dickie La, MD  Primary Care Provider: Phill Myron, MD Consultants: Cardiology  Indication for Hospitalization: abdominal pain  Discharge Diagnoses/Problem List:  Acute on chronic pancreatitis Atrial flutter Alcohol abuse Blood tinged stool T2DM  Disposition: home  Discharge Condition: stable  Discharge Exam: see progress note from day of discharge  Brief Hospital Course:  Timothy Martinez is a 64 y.o. male presenting with 3 day history of abdominal pain. PMH is significant for hx pancreatitis secondary to ethanol abuse, COPD, DM, HLD, GERD.  # Acute on chronic pancreatitis: Presented to ED with worsening abdominal pain. CT scan of abdomen noted for intramural pancreatic pseudocyst, inflammtory changes; but noted to have normal lipase. Was initially made NPO for bowel rest and given IV dilaudid for pain control.  As pain improved, diet was advance and pain was controlled with PO Oxy IR q4h prn.  # Atrial flutter, HLD:  Patient developed asymptomatic tachycardia while getting CT abdomen. Given adenosine and repeat EKG demonstrated flutter waves. CHADs-vasc score of 1. Cardiology was consulted and followed through admission.  Troponins neg x3, EKGs nonischemic.  Echo (1/12) showed EF 40-45%, hypokinesis, but unable to assess diastolic function 2/2 a flutter. Patient initially rate controlled with diltiazem drip, and then transitioned to PO diltiazem. Rate note well controlled on high dose of PO Diltiazem (60mg  q6h), so Metoprolol was added.  Patient was rate controlled on Diltiazem 60mg  q6h (discharged on long-acting) and Metoprolol 50mg  BID.  Cardiology started Eliquis with plan to cardiovert as an OP after ~4wks. Lipid panel done for  risk stratification, showed ASCVD 10 yr risk of 25.8%, so patient started on Atorvastatin 40mg  daily.  # Alcohol abuse: Patient monitored on CIWA protocol throughout admission.  No severe withdrawal symptoms.  #Blood tinged stool: Reported on admission x1 earlier in the day. Did not recur throughout admission, and hemoglobin stable.  # T2DM: Last a1c (07/2013) 6.3, and A1c 7.5 on admission. Patient was not taking any medications for diabetes as an outpatient.  He was managed on SSI as inpatient and discharged on Metformin 500mg  BID.  All other chronic medical conditions stable throughout admission and managed with home regimens.  Issues for Follow Up:  - Cardiology f/u for atrial flutter with possible cardioversion after 4 weeks - f/u T2DM: A1c 7.5, started on Metformin 500mg  BID, can likely uptitrate if tolerating - Alcohol abuse counseling - f/u abdominal pain  Significant Procedures: none  Significant Labs and Imaging:   Recent Labs Lab 08/06/14 0846 08/07/14 0231 08/09/14 1251  WBC 8.8 6.8 8.6  HGB 14.6 14.5 16.2  HCT 43.8 43.4 48.1  PLT 208 218 241    Recent Labs Lab 08/05/14 0255 08/05/14 1226 08/06/14 0846 08/07/14 0231 08/09/14 1251  NA 137  --  131* 139 136  K 4.1  --  3.9 3.9 4.2  CL 109  --  104 110 102  CO2 23  --  19 25 26   GLUCOSE 116*  --  115* 186* 193*  BUN 10  --  7 5* 11  CREATININE 0.88  --  0.93 0.84 1.01  CALCIUM 8.5  --  8.3* 9.0 10.0  MG  --  1.9  --   --   --   ALKPHOS 64  --   --   --   --  AST 21  --   --   --   --   ALT 21  --   --   --   --   ALBUMIN 3.1*  --   --   --   --     Drugs of Abuse   Labs (Brief)       Component Value Date/Time   LABOPIA POSITIVE* 08/04/2014 1712   COCAINSCRNUR NONE DETECTED 08/04/2014 1712   LABBENZ NONE DETECTED 08/04/2014 1712   AMPHETMU NONE DETECTED 08/04/2014 1712   THCU NONE DETECTED 08/04/2014 1712   LABBARB NONE DETECTED 08/04/2014 1712       Lipid Panel    Labs (Brief)       Component Value Date/Time   CHOL 120 08/05/2014 0845   TRIG 105 08/05/2014 0845   HDL 27* 08/05/2014 0845   CHOLHDL 4.4 08/05/2014 0845   VLDL 21 08/05/2014 0845   LDLCALC 72 08/05/2014 0845      Troponins neg x4  A1c 7.5  Lipase 45 LFTs wnl  Imaging/Diagnostic Tests: Ct Abdomen Pelvis W Contrast (1/11): 1. Intramural pancreatic pseudocyst in the second portion of the duodenum. Slight inflammatory changes in the adjacent soft tissues are consistent with pancreatitis or inflammation secondary to this cyst. 2. Chronic pancreatitis with chronic dilatation of the pancreatic duct. 3. Severe chronic lung disease.   EKG (1/11): atrial flutter  Echo (1/12): EF 40-45%, hypokinesis, unable to assess diastolic function 2/2 a flutter  CXR (1/14): 1. Unusual appearance of the lungs, as detailed above. If there is evidence of acute illness, these findings would be favored to reflect an acute bronchitis, potentially with developing multilobar bronchopneumonia. If symptoms are more insidious and onset, the possibility of interstitial lung disease warrants consideration. Followup nonemergent high-resolution chest CT could be obtained if there is clinical concern for interstitial lung disease, preferably after resolution of the patient's acute symptoms. 2. Atherosclerosis.   Results/Tests Pending at Time of Discharge: none  Discharge Medications:    Medication List    STOP taking these medications        baclofen 10 MG tablet  Commonly known as:  LIORESAL     ranitidine 150 MG tablet  Commonly known as:  ZANTAC      TAKE these medications        albuterol 108 (90 BASE) MCG/ACT inhaler  Commonly known as:  PROVENTIL HFA;VENTOLIN HFA  Inhale 2 puffs into the lungs at bedtime as needed for wheezing or shortness of breath.     aspirin EC 81 MG tablet  Take 81 mg by mouth daily.     atorvastatin 40 MG tablet  Commonly known  as:  LIPITOR  TAKE 1 TABLET EVERY DAY     diltiazem 120 MG 12 hr capsule  Commonly known as:  CARDIZEM SR  Take 1 capsule (120 mg total) by mouth 2 (two) times daily.     famotidine 40 MG tablet  Commonly known as:  PEPCID  Take 1 tablet (40 mg total) by mouth daily.     metFORMIN 500 MG tablet  Commonly known as:  GLUCOPHAGE  Take 1 tablet (500 mg total) by mouth 2 (two) times daily with a meal.     metoprolol 50 MG tablet  Commonly known as:  LOPRESSOR  Take 1 tablet (50 mg total) by mouth 2 (two) times daily.     mometasone-formoterol 200-5 MCG/ACT Aero  Commonly known as:  DULERA  Inhale 2 puffs into the lungs 2 (two) times  daily.     oxyCODONE-acetaminophen 5-325 MG per tablet  Commonly known as:  ROXICET  Take one half or one tablet twice daily as needed for pain        Discharge Instructions: Please refer to Patient Instructions section of EMR for full details.  Patient was counseled important signs and symptoms that should prompt return to medical care, changes in medications, dietary instructions, activity restrictions, and follow up appointments.   Follow-Up Appointments: Follow-up Information    Follow up with Lavon Paganini, MD On 08/22/2014.   Specialty:  Family Medicine   Why:  at 2:30pm for hospital follow up   Contact information:   [address] [address] [phone]       Follow up with Nahser, Wonda Cheng, MD.   Specialty:  Cardiology   Why:  Call if the office does not give you an appt within the next 2-3 weeks   Contact information:   [address] [place] [address] [phone]       Lavon Paganini, MD 08/11/2014, 10:35 PM PGY-1, [place]

## 2014-08-05 NOTE — Progress Notes (Signed)
Echocardiogram 2D Echocardiogram has been performed.  Timothy Martinez 08/05/2014, 8:55 AM

## 2014-08-05 NOTE — Consult Note (Signed)
CONSULT NOTE  Date: 08/05/2014               Patient Name:  Timothy Martinez MRN: [id number]  DOB: 03-12-1951 Age / Sex: 64 y.o., male        PCP: Phill Myron Primary Cardiologist: Tahje Borawski            Referring Physician: Nori Riis              Reason for Consult: Atrial flutter           History of Present Illness: Patient is a 64 y.o. male with a PMHx of pancreatitis due to ETOH abuse, COPD, DM, hyperlipidemia and GERD , who was admitted to [facility] on 08/04/2014 for evaluation of abdominal pain.  He was found to have atrial  flutter.   Pt has had abdominal pain for 4 days. Sharp, constant, worse with moving around.  No CP   He cannot tell that his heart is beating irregularly .  Has dyspnea.   Medications: Outpatient medications: Prescriptions prior to admission  Medication Sig Dispense Refill Last Dose  . albuterol (PROVENTIL HFA;VENTOLIN HFA) 108 (90 BASE) MCG/ACT inhaler Inhale 2 puffs into the lungs at bedtime as needed for wheezing or shortness of breath.   Past Month at Unknown time  . aspirin EC 81 MG tablet Take 81 mg by mouth daily.   Past Week at Unknown time  . atorvastatin (LIPITOR) 40 MG tablet TAKE 1 TABLET EVERY DAY 30 tablet 0 08/04/2014 at Unknown time  . mometasone-formoterol (DULERA) 200-5 MCG/ACT AERO Inhale 2 puffs into the lungs 2 (two) times daily. 2 Inhaler 0 Past Month at Unknown time  . ranitidine (ZANTAC) 150 MG tablet Take 1 tablet (150 mg total) by mouth 2 (two) times daily. 60 tablet 0 08/04/2014 at Unknown time  . baclofen (LIORESAL) 10 MG tablet Take 1 tablet (10 mg total) by mouth as needed for muscle spasms (1 pill at night). (Patient not taking: Reported on 08/04/2014) 5 each 0 Not Taking at Unknown time  . oxyCODONE-acetaminophen (ROXICET) 5-325 MG per tablet Take one half or one tablet twice daily as needed for pain (Patient not taking: Reported on 08/04/2014) 10 tablet 0 Not Taking at Unknown time  . ranitidine (ZANTAC) 150 MG tablet Take 1 tablet  (150 mg total) by mouth 2 (two) times daily. 60 tablet 11 Taking    Current medications: Current Facility-Administered Medications  Medication Dose Route Frequency Provider Last Rate Last Dose  . 0.9 %  sodium chloride infusion   Intravenous Continuous Leone Brand, MD 150 mL/hr at 08/05/14 [phone]    . acetaminophen (TYLENOL) tablet 650 mg  650 mg Oral Q6H PRN Leone Brand, MD       Or  . acetaminophen (TYLENOL) suppository 650 mg  650 mg Rectal Q6H PRN Leone Brand, MD      . albuterol (PROVENTIL) (2.5 MG/3ML) 0.083% nebulizer solution 3 mL  3 mL Inhalation QHS PRN Leone Brand, MD      . aspirin EC tablet 81 mg  81 mg Oral Daily Leone Brand, MD   81 mg at 08/05/14 1497  . atorvastatin (LIPITOR) tablet 40 mg  40 mg Oral q1800 Leone Brand, MD      . diltiazem (CARDIZEM) 100 mg in dextrose 5 % 100 mL (1 mg/mL) infusion  5-15 mg/hr Intravenous Continuous Leone Brand, MD 9.5 mL/hr at 08/05/14 0457 9.5 mg/hr at 08/05/14 0457  .  folic acid injection 1 mg  1 mg Intravenous Daily Leone Brand, MD   1 mg at 08/05/14 2841  . heparin injection 5,000 Units  5,000 Units Subcutaneous 3 times per day Leone Brand, MD   5,000 Units at 08/05/14 [phone]  . HYDROmorphone (DILAUDID) injection 0.5 mg  0.5 mg Intravenous Q3H PRN Leone Brand, MD   0.5 mg at 08/05/14 1139  . LORazepam (ATIVAN) injection 2-3 mg  2-3 mg Intravenous Q1H PRN Leone Brand, MD      . mometasone-formoterol [facility]) 200-5 MCG/ACT inhaler 2 puff  2 puff Inhalation BID Leone Brand, MD   2 puff at 08/05/14 1055  . ondansetron (ZOFRAN) tablet 4 mg  4 mg Oral Q6H PRN Leone Brand, MD       Or  . ondansetron [facility]) injection 4 mg  4 mg Intravenous Q6H PRN Leone Brand, MD      . pantoprazole (PROTONIX) injection 40 mg  40 mg Intravenous Q24H Lavon Paganini, MD   40 mg at 08/05/14 1138  . sodium chloride 0.9 % injection 3 mL  3 mL Intravenous Q12H Leone Brand, MD   3 mL at 08/05/14 0937  . thiamine (B-1)  injection 100 mg  100 mg Intravenous Daily Leone Brand, MD   100 mg at 08/05/14 0102     Allergies  Allergen Reactions  . Tramadol Nausea And Vomiting  . Morphine And Related Swelling    Pt states he can tolerate oxycodone and hydromorphone  . Penicillins Rash     Past Medical History  Diagnosis Date  . Pancreas disorder   . Diabetes mellitus   . Hyperlipidemia   . Shortness of breath     with ambulation  . Anginal pain   . Pneumonia   . GERD (gastroesophageal reflux disease)   . Headache(784.0)   . Arthritis     Past Surgical History  Procedure Laterality Date  . Back surgery    . Neck surgery      not cervical, states had lesion on neck which was removed  . Splenectomy      Family History  Problem Relation Age of Onset  . Diabetes Mother   . Aneurysm Mother   . Alcohol abuse Father     Social History:  reports that he has been smoking Cigarettes.  He started smoking about 50 years ago. He has a 50 pack-year smoking history. He has never used smokeless tobacco. He reports that he drinks alcohol. He reports that he does not use illicit drugs.   Review of Systems: Constitutional:  denies fever, chills, diaphoresis, appetite change and fatigue.  HEENT: denies photophobia, eye pain, redness, hearing loss, ear pain, congestion, sore throat, rhinorrhea, sneezing, neck pain, neck stiffness and tinnitus.  Respiratory: denies SOB, DOE, cough, chest tightness, and wheezing.  Cardiovascular: denies chest pain, palpitations and leg swelling.  Gastrointestinal: admits to nausea, vomiting, abdominal pain, diarrhea, constipation, blood in stool.  Genitourinary: denies dysuria, urgency, frequency, hematuria, flank pain and difficulty urinating.  Musculoskeletal: denies  myalgias, back pain, joint swelling, arthralgias and gait problem.   Skin: denies pallor, rash and wound.  Neurological: denies dizziness, seizures, syncope, weakness, light-headedness, numbness and headaches.    Hematological: denies adenopathy, easy bruising, personal or family bleeding history.  Psychiatric/ Behavioral: denies suicidal ideation, mood changes, confusion, nervousness, sleep disturbance and agitation.    Physical Exam: BP 112/63 mmHg  Pulse 75  Temp(Src) 97.5 F (36.4 C) (Axillary)  Resp 21  Ht 6\' 3"  (1.905 m)  Wt 198 lb (89.812 kg)  BMI 24.75 kg/m2  SpO2 94%  Wt Readings from Last 3 Encounters:  08/04/14 198 lb (89.812 kg)  03/13/14 193 lb 4.8 oz (87.68 kg)  03/06/14 190 lb (86.183 kg)    General: Vital signs reviewed and noted. Well-developed, well-nourished, in no acute distress; alert,   Head: Normocephalic, atraumatic, sclera anicteric,   Neck: Supple. Negative for carotid bruits. No JVD   Lungs:  Clear bilaterally, no  wheezes, rales, or rhonchi. Breathing is normal   Heart: RRR with S1 S2. No murmurs, rubs, or gallops   Abdomen:  + BS   MSK: Strength and the appear normal for age.   Extremities: No clubbing or cyanosis. No edema.  Distal pedal pulses are 2+ and equal   Neurologic: Alert and oriented X 3. Moves all extremities spontaneously.  Psych: Responds to questions appropriately with a normal affect.     Lab results: Basic Metabolic Panel:  Recent Labs Lab 08/04/14 1608 08/04/14 1651 08/05/14 0255 08/05/14 1226  NA 133* 138 137  --   K 5.2* 5.0 4.1  --   CL 105 103 109  --   CO2 23  --  23  --   GLUCOSE 143* 147* 116*  --   BUN 15 21 10   --   CREATININE 1.02 0.90 0.88  --   CALCIUM 9.1  --  8.5  --   MG  --   --   --  1.9    Liver Function Tests:  Recent Labs Lab 08/04/14 1608 08/05/14 0255  AST 39* 21  ALT 30 21  ALKPHOS 80 64  BILITOT 1.4* 1.0  PROT 7.6 6.7  ALBUMIN 3.9 3.1*    Recent Labs Lab 08/04/14 1634  LIPASE 45   No results for input(s): AMMONIA in the last 168 hours.  CBC:  Recent Labs Lab 08/04/14 1608 08/04/14 1651 08/05/14 0255  WBC 8.5  --  10.5  NEUTROABS 4.7  --   --   HGB 16.8 18.7* 15.5  HCT  48.4 55.0* 46.7  MCV 91.0  --  95.1  PLT 236  --  233    Cardiac Enzymes:  Recent Labs Lab 08/04/14 2120 08/05/14 0255 08/05/14 0845  TROPONINI <0.03 <0.03 <0.03    BNP: Invalid input(s): POCBNP  CBG: No results for input(s): GLUCAP in the last 168 hours.  Coagulation Studies:  Recent Labs  08/04/14 1608  LABPROT 14.1  INR 1.07     Other results:  EKG : atrial flutter with controlled rate.    Imaging: Ct Abdomen Pelvis W Contrast  08/04/2014   CLINICAL DATA:  Diffuse lower abdominal pain radiating to the back with nausea and vomiting for 5 days.  EXAM: CT ABDOMEN AND PELVIS WITH CONTRAST  TECHNIQUE: Multidetector CT imaging of the abdomen and pelvis was performed using the standard protocol following bolus administration of intravenous contrast.  CONTRAST:  18mL OMNIPAQUE IOHEXOL 300 MG/ML  SOLN  COMPARISON:  04/25/2013  FINDINGS: There is a 3.6 x 2.5 x 2.1 cm intramural pseudocyst in the anterior wall of the second portion of the duodenum adjacent to the pancreatic head. There is soft tissue stranding into the adjacent soft tissues extending toward the hepatic flexure of the colon consistent with pancreatitis.  There is chronic marked dilatation of the pancreatic duct to a diameter of 10 mm. Previous splenectomy.  Liver demonstrates patchy hepatic steatosis in the right posterior and  inferior aspects of the right lobe. Biliary tree is normal.  The adrenal glands and kidneys are normal except for chronic perinephric soft tissue stranding around the each kidney.  The bowel is normal the in the abnormality in the second portion the duodenum. Slight atheromatous disease of the abdominal aorta and common iliac arteries.  Bladder and prostate gland appear normal. No adenopathy. No acute osseous abnormalities.  Severe chronic interstitial and obstructive lung disease, stable. Stable 6 mm nodule at the left lung base.  IMPRESSION: 1. Intramural pancreatic pseudocyst in the second  portion of the duodenum. Slight inflammatory changes in the adjacent soft tissues are consistent with pancreatitis or inflammation secondary to this cyst. 2. Chronic pancreatitis with chronic dilatation of the pancreatic duct. 3. Severe chronic lung disease.   Electronically Signed   By: Rozetta Nunnery M.D.   On: 08/04/2014 19:01     Echo Jan. 12, 2016:  Study Conclusions  - Left ventricle: The cavity size was normal. Systolic function was mildly to moderately reduced. The estimated ejection fraction was in the range of 40% to 45%. Diffuse hypokinesis. The study was not technically sufficient to allow evaluation of LV diastolic dysfunction due to atrial flutter. - Aortic valve: Trileaflet; normal thickness leaflets. There was no regurgitation. - Aortic root: The aortic root was normal in size. - Mitral valve: Structurally normal valve. There was no regurgitation. - Left atrium: The atrium was mildly dilated. - Right ventricle: Systolic function was normal. - Right atrium: The atrium was mildly dilated. - Tricuspid valve: There was mild regurgitation. - Pulmonic valve: There was no regurgitation. - Pulmonary arteries: Systolic pressure was within the normal range. - Inferior vena cava: The vessel was normal in size. - Pericardium, extracardiac: There was no pericardial effusion   Assessment & Plan:  1. Atrial flutter:  Pt presents with newly discovered atrial flutter. He has a history of chronic alcoholism and  He now presents with pancreatitis.  He cannot tell that his heart is beating irregular. He does have some shortness of breath status due to smoking. He's never had any episodes of chest discomfort.  He is on disability. He does not get any exercise and does not work.  I suspect that this episode of atrial flutter is related to his pancreatitis and is chronic gout call intake.  Will start him on Eliquis 5 mg BID. We can we can consider cardioverting him after  he's been on a month of anticoagulation. I stressed the importance of taking his anticoagulation on a daily basis prior to any cardioversion.  His rate is currently well controlled   2. Chronic systolic congestive heart failure: Echocardiogram reveals mildly depressed left ventricular systolic function.  I suspect that this is due to his chronic alcohol intake and also may be due to episodes of tachycardia.  He'll need further evaluation for ischemia. Will anticipate doing a stress Myoview study as an outpatient. We'll start him on low-dose Toprol-XL. His blood pressure is on the losartan not sure that he'll tolerate carvedilol.      Thayer Headings, Brooke Bonito., MD, [facility] 08/05/2014, 2:08 PM Office - [phone] Pager [phone]

## 2014-08-05 NOTE — Progress Notes (Signed)
Family Medicine Teaching Service Daily Progress Note Intern Pager: [phone]  Patient name: Timothy Martinez Medical record number: [id number] Date of birth: 1951/05/21 Age: 64 y.o. Gender: male  Primary Care Provider: Phill Myron, MD Consultants: none Code Status: full  Pt Overview and Major Events to Date:  1/12 - admit for acute on chronic pancreatitis and atrial flutter  Assessment and Plan: Timothy Martinez is a 64 y.o. male presenting with 3 day history of abdominal pain. PMH is significant for hx pancreatitis secondary to ethanol abuse, COPD, DM, HLD, GERD.  # Suspected acute on chronic pancreatitis: initial location of pain not consistent but on exam pt is more tender in epigastrium. CT scan of abdomen noted for intramural pancreatic pseudocyst, inflammtory changes; but noted to have normal lipase. Vitals stable - NPO for bowel rest - NS @ 150cc/hr - dilaudid 0.5mg  q3hrs PRN (morphine allergy) - cmet and cbc in AM - lipid panel to check for other possible causes of pancreatitis - On ranitidine at home, will give IV PPI while NPO  # Atrial flutter: Rate controlled currently. developed asx tachycardia after CT. Given adenosine and repeat EKG demonstrated flutter waves. ED started on heparin. CHADs-vasc 1. - diltiazem drip - Consult cardiology today - Will discuss anticoagulation - need to consider risks/benefits in this alcoholic patient with likely high fall risk - cycle troponins - neg x2 - echo pending - can consider d/c LABA in dulera  # Alcohol abuse: UDS positive for opiates (noted to have received this as prescription, last filled in August per Epic review) - CIWA protocol  #Blood tinged stool: Reported on admission. Nonrecurrent - Monitor hemoglobin - currently stable (15.5 this AM)  # T2DM: last a1c 07/2013 6.3. Not currently taking any medications - CBG monitoring - 116-143 - repeat a1c pending  FEN/GI: NPO with sips/chips, NS @ 150cc/hr Prophylaxis: heparin  sq  Disposition: pending transition to PO, pain control, cards recs  Subjective:  Ongoing abd pain. No CP/SOB. Is worried about heart rate as he has never had heart problems.  Objective: Temp:  [97.3 F (36.3 C)-98.2 F (36.8 C)] 97.3 F (36.3 C) (01/12 0452) Pulse Rate:  [46-150] 72 (01/12 0452) Resp:  [11-32] 22 (01/12 0452) BP: (90-128)/(54-98) 93/59 mmHg (01/12 0452) SpO2:  [90 %-100 %] 90 % (01/12 0452) Weight:  [198 lb (89.812 kg)] 198 lb (89.812 kg) (01/11 1552) Physical Exam: General: NAD, laying in bed appears comfortable Cardiovascular: reg rate, irregular rhythm, no murmur appreciated. 2+ radial and PT pulse bilaterally Respiratory: CTAB, normal effort. Abdomen: soft, tender primarily epigstrium and RUQ, below umbilicus less tender, voluntary guarding, no rebound. Bowel sounds normal Extremities: no edema or cyanosis. WWP. Neuro: alert and oriented, no focal deficits.  Laboratory:  Recent Labs Lab 08/04/14 1608 08/04/14 1651 08/05/14 0255  WBC 8.5  --  10.5  HGB 16.8 18.7* 15.5  HCT 48.4 55.0* 46.7  PLT 236  --  233    Recent Labs Lab 08/04/14 1608 08/04/14 1651 08/05/14 0255  NA 133* 138 137  K 5.2* 5.0 4.1  CL 105 103 109  CO2 23  --  23  BUN 15 21 10   CREATININE 1.02 0.90 0.88  CALCIUM 9.1  --  8.5  PROT 7.6  --  6.7  BILITOT 1.4*  --  1.0  ALKPHOS 80  --  64  ALT 30  --  21  AST 39*  --  21  GLUCOSE 143* 147* 116*    Drugs of Abuse  Component Value Date/Time   LABOPIA POSITIVE* 08/04/2014 1712   COCAINSCRNUR NONE DETECTED 08/04/2014 1712   LABBENZ NONE DETECTED 08/04/2014 1712   AMPHETMU NONE DETECTED 08/04/2014 1712   THCU NONE DETECTED 08/04/2014 1712   LABBARB NONE DETECTED 08/04/2014 1712     Troponins neg x2  Lipase 45 LFTs wnl  Imaging/Diagnostic Tests: Ct Abdomen Pelvis W Contrast (1/11): 1. Intramural pancreatic pseudocyst in the second portion of the duodenum. Slight inflammatory changes in the adjacent soft tissues  are consistent with pancreatitis or inflammation secondary to this cyst. 2. Chronic pancreatitis with chronic dilatation of the pancreatic duct. 3. Severe chronic lung disease.   EKG (1/11): atrial flutter  Lavon Paganini, MD 08/05/2014, 7:31 AM PGY-1, [place] Intern pager: [phone], text pages welcome

## 2014-08-05 NOTE — Care Management Note (Addendum)
    Page 1 of 1   08/07/2014     1:19:31 PM CARE MANAGEMENT NOTE 08/07/2014  Patient:  Timothy Martinez, Timothy Martinez   Account Number:  [id number]  Date Initiated:  08/05/2014  Documentation initiated by:  Amos Micheals  Subjective/Objective Assessment:   dx pancreatitis/AFlutter; lives alone    PCP  Phill Myron     Anticipated DC Date:  08/09/2014   Anticipated DC Plan:  [place]  CM consult      Status of service:  In process, will continue to follow Medicare Important Message given?  YES (If response is "NO", the following Medicare IM given date fields will be blank) Date Medicare IM given:  08/07/2014 Medicare IM given by:  Florena Kozma Date Additional Medicare IM given:   Additional Medicare IM given by:    Per UR Regulation:  Reviewed for med. necessity/level of care/duration of stay  Comments:  08/07/14 [place] Pt's pharmacy is [facility] on [address]; pharmacist ran a test claim for Eliquis 5 mg BID and stated pt's copay would be $7.47 for a 30-day supply.  Provided pt with card for free 30-day trial.

## 2014-08-06 ENCOUNTER — Ambulatory Visit: Payer: Medicare Other | Admitting: Family Medicine

## 2014-08-06 DIAGNOSIS — Z720 Tobacco use: Secondary | ICD-10-CM

## 2014-08-06 DIAGNOSIS — K859 Acute pancreatitis, unspecified: Principal | ICD-10-CM

## 2014-08-06 DIAGNOSIS — E785 Hyperlipidemia, unspecified: Secondary | ICD-10-CM

## 2014-08-06 DIAGNOSIS — I4891 Unspecified atrial fibrillation: Secondary | ICD-10-CM | POA: Diagnosis not present

## 2014-08-06 DIAGNOSIS — I4892 Unspecified atrial flutter: Secondary | ICD-10-CM | POA: Diagnosis not present

## 2014-08-06 LAB — CBC
HCT: 43.8 % (ref 39.0–52.0)
Hemoglobin: 14.6 g/dL (ref 13.0–17.0)
MCH: 31.1 pg (ref 26.0–34.0)
MCHC: 33.3 g/dL (ref 30.0–36.0)
MCV: 93.2 fL (ref 78.0–100.0)
Platelets: 208 10*3/uL (ref 150–400)
RBC: 4.7 MIL/uL (ref 4.22–5.81)
RDW: 13.9 % (ref 11.5–15.5)
WBC: 8.8 10*3/uL (ref 4.0–10.5)

## 2014-08-06 LAB — BASIC METABOLIC PANEL
Anion gap: 8 (ref 5–15)
BUN: 7 mg/dL (ref 6–23)
CO2: 19 mmol/L (ref 19–32)
Calcium: 8.3 mg/dL — ABNORMAL LOW (ref 8.4–10.5)
Chloride: 104 mEq/L (ref 96–112)
Creatinine, Ser: 0.93 mg/dL (ref 0.50–1.35)
GFR calc Af Amer: >90 mL/min (ref >90)
GFR calc non Af Amer: 88 mL/min — ABNORMAL LOW (ref >90)
Glucose, Bld: 115 mg/dL — ABNORMAL HIGH (ref 70–99)
Potassium: 3.9 mmol/L (ref 3.5–5.1)
Sodium: 131 mmol/L — ABNORMAL LOW (ref 135–145)

## 2014-08-06 LAB — GLUCOSE, CAPILLARY
Glucose-Capillary: 108 mg/dL — ABNORMAL HIGH (ref 70–99)
Glucose-Capillary: 149 mg/dL — ABNORMAL HIGH (ref 70–99)

## 2014-08-06 MED ORDER — DILTIAZEM HCL 100 MG IV SOLR: 5.0000 mg/h | INTRAVENOUS | Status: DC

## 2014-08-06 MED ORDER — OXYCODONE HCL 5 MG PO TABS
5.0000 mg | ORAL_TABLET | ORAL | Status: DC | PRN
  Administered 2014-08-07 – 2014-08-10 (×8): 5 mg via ORAL
  Filled 2014-08-06 (×9): qty 1

## 2014-08-06 MED ORDER — DILTIAZEM HCL 100 MG IV SOLR
5.0000 mg/h | INTRAVENOUS | Status: DC
  Administered 2014-08-06 (×3): 12.5 mg/h via INTRAVENOUS
  Administered 2014-08-06: 15 mg/h via INTRAVENOUS
  Filled 2014-08-06 (×4): qty 100

## 2014-08-06 NOTE — Progress Notes (Signed)
    Subjective:  No SOB, no CP  Objective:  Vital Signs in the last 24 hours: Temp:  [97.2 F (36.2 C)-98.3 F (36.8 C)] 98.1 F (36.7 C) (01/13 1135) Pulse Rate:  [56-102] 99 (01/13 0747) Resp:  [14-41] 16 (01/13 0747) BP: (95-114)/(41-81) 102/61 mmHg (01/13 0747) SpO2:  [92 %-98 %] 92 % (01/13 0902)  Intake/Output from previous day: 01/12 0701 - 01/13 0700 In: 3628.5 [I.V.:3628.5] Out: 1480 [Urine:1480]   Physical Exam: General: Well developed, well nourished, in no acute distress. Head:  Normocephalic and atraumatic. Lungs: Clear to auscultation and percussion. Heart: Irreg irreg  No murmur, rubs or gallops.  Abdomen: soft, non-tender, positive bowel sounds. Extremities: No clubbing or cyanosis. No edema. Neurologic: Alert and oriented x 3.    Lab Results:  Recent Labs  08/05/14 0255 08/06/14 0846  WBC 10.5 8.8  HGB 15.5 14.6  PLT 233 208    Recent Labs  08/05/14 0255 08/06/14 0846  NA 137 131*  K 4.1 3.9  CL 109 104  CO2 23 19  GLUCOSE 116* 115*  BUN 10 7  CREATININE 0.88 0.93    Recent Labs  08/05/14 0255 08/05/14 0845  TROPONINI <0.03 <0.03   Hepatic Function Panel  Recent Labs  08/05/14 0255  PROT 6.7  ALBUMIN 3.1*  AST 21  ALT 21  ALKPHOS 64  BILITOT 1.0    Recent Labs  08/05/14 0845  CHOL 120   Telemetry: Aflutter variable rate Personally viewed.    Cardiac Studies:  EF 45%  Assessment/Plan:  Active Problems:   Pancreatitis   Abdominal pain   Atrial flutter, unspecified   Atrial fibrillation with rapid ventricular response   COLD (chronic obstructive lung disease)  Aflutter  - anticoagulation - Eliquis - worried about cost. Please have case mgt discuss with him.   - after 4 weeks of Eliquis, cardiovert.   - Rate controlled currently on dilt IV   - Normally with EF 40-45% would like to use beta blocker but in his case, may have tachycardia induced cardiomyopathy and EF could improve with current treatment.   -  Consider change to PO dilt when able.   Cardiomyopathy - ?ETOH, Tachy induced  - as above    Timothy Martinez 08/06/2014, 12:31 PM

## 2014-08-06 NOTE — Progress Notes (Signed)
Family Medicine Teaching Service Daily Progress Note Intern Pager: [phone]  Patient name: Timothy Martinez Medical record number: [id number] Date of birth: 17-Jun-1951 Age: 64 y.o. Gender: male  Primary Care Provider: Phill Myron, MD Consultants: none Code Status: full  Pt Overview and Major Events to Date:  1/12 - admit for acute on chronic pancreatitis and atrial flutter  Assessment and Plan: Timothy Martinez is a 64 y.o. male presenting with 3 day history of abdominal pain. PMH is significant for hx pancreatitis secondary to ethanol abuse, COPD, DM, HLD, GERD.  # Suspected acute on chronic pancreatitis: initial location of pain not consistent but on exam pt is more tender in epigastrium. CT scan of abdomen noted for intramural pancreatic pseudocyst, inflammtory changes; but noted to have normal lipase. Vitals stable - NPO for bowel rest - will advance to clears today - NS @ 150cc/hr - may be able to [facility] if tolerating PO well  - dilaudid 0.5mg  q3hrs PRN (morphine allergy) - required 7 doses yesterday - will d/c and transition to PO - Will deescalate to Oxy IR 5mg  q4h prn - On ranitidine at home, will give IV PPI   # Atrial flutter: Rate controlled currently. developed asx tachycardia after CT. Given adenosine and repeat EKG demonstrated flutter waves. CHADs-vasc 1. BPs soft O/N. - diltiazem drip - will attempt to d/c when possible - low dose Toprol XL per cards - Consult cardiology - appreciate recs - Will discuss anticoagulation - need to consider risks/benefits in this alcoholic patient with likely high fall risk. On Eliquis per cards - cycle troponins - neg x3 - echo (1/12) - EF 40-45%, hypokinesis, unable to assess diastolic function 2/2 a flutter - can consider d/c LABA in dulera - lipid panel - ASCVD 10 yr risk of 25.8% - can start high intensity statin  # Alcohol abuse: UDS positive for opiates (noted to have received this as prescription, last filled in August per Epic  review) - CIWA protocol - scores 5, 5, 10  #Blood tinged stool: Reported on admission. Nonrecurrent - Monitor hemoglobin - currently stable - AM pending  # T2DM: last a1c 07/2013 6.3, 7.5 on admission. Not currently taking any medications - CBG monitoring - Consider starting metformin on discharge  FEN/GI: Clear liq diet, KVO Prophylaxis: heparin sq  Disposition: pending transition to PO, pain control, cards recs  Subjective:  Abdominal pain improving, wants to eat something.  Objective: Temp:  [97.2 F (36.2 C)-98.3 F (36.8 C)] 98.1 F (36.7 C) (01/13 1135) Pulse Rate:  [56-102] 99 (01/13 0747) Resp:  [14-41] 16 (01/13 0747) BP: (95-120)/(41-84) 102/61 mmHg (01/13 0747) SpO2:  [92 %-98 %] 92 % (01/13 0902) Physical Exam: General: NAD, laying in bed appears comfortable Cardiovascular: reg rate, irregular rhythm, no murmur appreciated. 2+ radial and PT pulse bilaterally Respiratory: CTAB, normal effort. Abdomen: soft, mildly TTP in epigstrium and RUQ, no rebound. Bowel sounds normal Extremities: no edema or cyanosis. WWP. Neuro: alert and oriented, no focal deficits.  Laboratory:  Recent Labs Lab 08/04/14 1608 08/04/14 1651 08/05/14 0255 08/06/14 0846  WBC 8.5  --  10.5 8.8  HGB 16.8 18.7* 15.5 14.6  HCT 48.4 55.0* 46.7 43.8  PLT 236  --  233 208    Recent Labs Lab 08/04/14 1608 08/04/14 1651 08/05/14 0255 08/06/14 0846  NA 133* 138 137 131*  K 5.2* 5.0 4.1 3.9  CL 105 103 109 104  CO2 23  --  23 19  BUN 15 21 10  7  CREATININE 1.02 0.90 0.88 0.93  CALCIUM 9.1  --  8.5 8.3*  PROT 7.6  --  6.7  --   BILITOT 1.4*  --  1.0  --   ALKPHOS 80  --  64  --   ALT 30  --  21  --   AST 39*  --  21  --   GLUCOSE 143* 147* 116* 115*    Drugs of Abuse     Component Value Date/Time   LABOPIA POSITIVE* 08/04/2014 1712   COCAINSCRNUR NONE DETECTED 08/04/2014 1712   LABBENZ NONE DETECTED 08/04/2014 1712   AMPHETMU NONE DETECTED 08/04/2014 1712   THCU NONE  DETECTED 08/04/2014 1712   LABBARB NONE DETECTED 08/04/2014 1712     Lipid Panel     Component Value Date/Time   CHOL 120 08/05/2014 0845   TRIG 105 08/05/2014 0845   HDL 27* 08/05/2014 0845   CHOLHDL 4.4 08/05/2014 0845   VLDL 21 08/05/2014 0845   LDLCALC 72 08/05/2014 0845    Troponins neg x3  A1c 7.5  Lipase 45 LFTs wnl  Imaging/Diagnostic Tests: Ct Abdomen Pelvis W Contrast (1/11): 1. Intramural pancreatic pseudocyst in the second portion of the duodenum. Slight inflammatory changes in the adjacent soft tissues are consistent with pancreatitis or inflammation secondary to this cyst. 2. Chronic pancreatitis with chronic dilatation of the pancreatic duct. 3. Severe chronic lung disease.   EKG (1/11): atrial flutter  Echo (1/12): EF 40-45%, hypokinesis, unable to assess diastolic function 2/2 a flutter  Lavon Paganini, MD 08/06/2014, 11:52 AM PGY-1, [place] Intern pager: [phone], text pages welcome

## 2014-08-07 ENCOUNTER — Inpatient Hospital Stay (HOSPITAL_COMMUNITY): Payer: Medicare Other

## 2014-08-07 DIAGNOSIS — E119 Type 2 diabetes mellitus without complications: Secondary | ICD-10-CM

## 2014-08-07 DIAGNOSIS — R0789 Other chest pain: Secondary | ICD-10-CM

## 2014-08-07 DIAGNOSIS — R079 Chest pain, unspecified: Secondary | ICD-10-CM | POA: Insufficient documentation

## 2014-08-07 DIAGNOSIS — K858 Other acute pancreatitis: Secondary | ICD-10-CM

## 2014-08-07 DIAGNOSIS — F1010 Alcohol abuse, uncomplicated: Secondary | ICD-10-CM

## 2014-08-07 DIAGNOSIS — I4891 Unspecified atrial fibrillation: Secondary | ICD-10-CM | POA: Diagnosis not present

## 2014-08-07 DIAGNOSIS — I429 Cardiomyopathy, unspecified: Secondary | ICD-10-CM | POA: Diagnosis not present

## 2014-08-07 DIAGNOSIS — I7090 Unspecified atherosclerosis: Secondary | ICD-10-CM | POA: Diagnosis not present

## 2014-08-07 DIAGNOSIS — J90 Pleural effusion, not elsewhere classified: Secondary | ICD-10-CM | POA: Diagnosis not present

## 2014-08-07 DIAGNOSIS — J9809 Other diseases of bronchus, not elsewhere classified: Secondary | ICD-10-CM | POA: Diagnosis not present

## 2014-08-07 DIAGNOSIS — K859 Acute pancreatitis, unspecified: Secondary | ICD-10-CM | POA: Diagnosis not present

## 2014-08-07 DIAGNOSIS — I959 Hypotension, unspecified: Secondary | ICD-10-CM | POA: Diagnosis not present

## 2014-08-07 DIAGNOSIS — I4892 Unspecified atrial flutter: Secondary | ICD-10-CM | POA: Diagnosis not present

## 2014-08-07 LAB — TROPONIN I: Troponin I: <0.03 ng/mL (ref <0.031)

## 2014-08-07 LAB — CBC
HCT: 43.4 % (ref 39.0–52.0)
Hemoglobin: 14.5 g/dL (ref 13.0–17.0)
MCH: 30.8 pg (ref 26.0–34.0)
MCHC: 33.4 g/dL (ref 30.0–36.0)
MCV: 92.1 fL (ref 78.0–100.0)
Platelets: 218 10*3/uL (ref 150–400)
RBC: 4.71 MIL/uL (ref 4.22–5.81)
RDW: 13.7 % (ref 11.5–15.5)
WBC: 6.8 10*3/uL (ref 4.0–10.5)

## 2014-08-07 LAB — GLUCOSE, CAPILLARY
Glucose-Capillary: 154 mg/dL — ABNORMAL HIGH (ref 70–99)
Glucose-Capillary: 131 mg/dL — ABNORMAL HIGH (ref 70–99)
Glucose-Capillary: 218 mg/dL — ABNORMAL HIGH (ref 70–99)
Glucose-Capillary: 125 mg/dL — ABNORMAL HIGH (ref 70–99)

## 2014-08-07 LAB — BASIC METABOLIC PANEL
Anion gap: 4 — ABNORMAL LOW (ref 5–15)
BUN: 5 mg/dL — ABNORMAL LOW (ref 6–23)
CO2: 25 mmol/L (ref 19–32)
Calcium: 9 mg/dL (ref 8.4–10.5)
Chloride: 110 mEq/L (ref 96–112)
Creatinine, Ser: 0.84 mg/dL (ref 0.50–1.35)
GFR calc Af Amer: >90 mL/min (ref >90)
GFR calc non Af Amer: >90 mL/min (ref >90)
Glucose, Bld: 186 mg/dL — ABNORMAL HIGH (ref 70–99)
Potassium: 3.9 mmol/L (ref 3.5–5.1)
Sodium: 139 mmol/L (ref 135–145)

## 2014-08-07 MED ORDER — DILTIAZEM HCL 60 MG PO TABS
60.0000 mg | ORAL_TABLET | Freq: Four times a day (QID) | ORAL | Status: DC
  Administered 2014-08-07 – 2014-08-10 (×13): 60 mg via ORAL
  Filled 2014-08-07 (×16): qty 1

## 2014-08-07 MED ORDER — DILTIAZEM HCL 100 MG IV SOLR
5.0000 mg/h | INTRAVENOUS | Status: DC
  Administered 2014-08-07: 5 mg/h via INTRAVENOUS
  Filled 2014-08-07: qty 100

## 2014-08-07 MED ORDER — INSULIN ASPART 100 UNIT/ML ~~LOC~~ SOLN
0.0000 [IU] | Freq: Three times a day (TID) | SUBCUTANEOUS | Status: DC
  Administered 2014-08-07: 3 [IU] via SUBCUTANEOUS
  Administered 2014-08-08 (×2): 2 [IU] via SUBCUTANEOUS
  Administered 2014-08-08: 1 [IU] via SUBCUTANEOUS
  Administered 2014-08-09: 3 [IU] via SUBCUTANEOUS
  Administered 2014-08-09 (×2): 2 [IU] via SUBCUTANEOUS
  Administered 2014-08-10 (×2): 3 [IU] via SUBCUTANEOUS

## 2014-08-07 NOTE — Progress Notes (Signed)
Inpatient Diabetes Program Recommendations  AACE/ADA: New Consensus Statement on Inpatient Glycemic Control (2013)  Target Ranges:  Prepandial:   less than 140 mg/dL      Peak postprandial:   less than 180 mg/dL (1-2 hours)      Critically ill patients:  140 - 180 mg/dL   Reason for Assessment:  Results for BRYNN, REZNIK (MRN [id number]) as of 08/07/2014 10:07  Ref. Range 08/06/2014 11:28 08/06/2014 21:39 08/07/2014 08:00  Glucose-Capillary Latest Range: 70-99 mg/dL 108 (H) 149 (H) 154 (H)  Results for ASAAD, GULLEY (MRN [id number]) as of 08/07/2014 10:07  Ref. Range 08/05/2014 02:55  Hgb A1c MFr Bld Latest Range: <5.7 % 7.5 (H)   Diabetes history:  Type 2 diabetes Outpatient Diabetes medications: None Current orders for Inpatient glycemic control: None noted.  Please add Novolog sensitive tid with meals and HS while in the hospital.  Note that A1C elevated and patient will likely start metformin at discharge.   Thanks, Adah Perl, RN, BC-ADM Inpatient Diabetes Coordinator Pager [phone]

## 2014-08-07 NOTE — Progress Notes (Signed)
Subjective: Sharp CP  During the night that only lasted a couple minutes  Objective: Vital signs in last 24 hours: Temp:  [97.9 F (36.6 C)-98.2 F (36.8 C)] 97.9 F (36.6 C) (01/14 0800) Pulse Rate:  [42-78] 51 (01/14 0600) Resp:  [15-30] 22 (01/14 0600) BP: (84-113)/(50-69) 108/69 mmHg (01/14 0600) SpO2:  [89 %-100 %] 100 % (01/14 0929) Last BM Date: 08/04/14  Intake/Output from previous day: 01/13 0701 - 01/14 0700 In: 2550 [P.O.:2400; I.V.:150] Out: 3375 [Urine:3375] Intake/Output this shift: Total I/O In: 3 [I.V.:3] Out: -   Medications Current Facility-Administered Medications  Medication Dose Route Frequency Provider Last Rate Last Dose  . 0.9 %  sodium chloride infusion   Intravenous Continuous Lavon Paganini, MD 10 mL/hr at 08/06/14 1211    . acetaminophen (TYLENOL) tablet 650 mg  650 mg Oral Q6H PRN Leone Brand, MD   650 mg at 08/06/14 2357   Or  . acetaminophen (TYLENOL) suppository 650 mg  650 mg Rectal Q6H PRN Leone Brand, MD      . albuterol (PROVENTIL) (2.5 MG/3ML) 0.083% nebulizer solution 3 mL  3 mL Inhalation QHS PRN Leone Brand, MD      . aspirin EC tablet 81 mg  81 mg Oral Daily Leone Brand, MD   81 mg at 08/07/14 1003  . atorvastatin (LIPITOR) tablet 40 mg  40 mg Oral q1800 Leone Brand, MD   40 mg at 08/06/14 1750  . diltiazem (CARDIZEM) 100 mg in dextrose 5 % 100 mL (1 mg/mL) infusion  5-15 mg/hr Intravenous Titrated Leeanne Rio, MD 12.5 mL/hr at 08/06/14 1854 12.5 mg/hr at 08/06/14 1854  . folic acid injection 1 mg  1 mg Intravenous Daily Leone Brand, MD   1 mg at 08/07/14 1003  . heparin injection 5,000 Units  5,000 Units Subcutaneous 3 times per day Leone Brand, MD   5,000 Units at 08/07/14 [phone]  . LORazepam (ATIVAN) injection 2-3 mg  2-3 mg Intravenous Q1H PRN Leone Brand, MD   2 mg at 08/06/14 2358  . mometasone-formoterol (DULERA) 200-5 MCG/ACT inhaler 2 puff  2 puff Inhalation BID Leone Brand, MD   2 puff  at 08/07/14 [phone]  . ondansetron (ZOFRAN) tablet 4 mg  4 mg Oral Q6H PRN Leone Brand, MD       Or  . ondansetron [facility]) injection 4 mg  4 mg Intravenous Q6H PRN Leone Brand, MD      . oxyCODONE (Oxy IR/ROXICODONE) immediate release tablet 5 mg  5 mg Oral Q4H PRN Lorna Few, DO   5 mg at 08/07/14 9211  . pantoprazole (PROTONIX) injection 40 mg  40 mg Intravenous Q24H Lavon Paganini, MD   40 mg at 08/07/14 1004  . sodium chloride 0.9 % injection 3 mL  3 mL Intravenous Q12H Leone Brand, MD   3 mL at 08/07/14 1004  . thiamine (B-1) injection 100 mg  100 mg Intravenous Daily Leone Brand, MD   100 mg at 08/07/14 1003    PE: General appearance: alert, cooperative and no distress Lungs: clear to auscultation bilaterally Heart: irregularly irregular rhythm and No MM Extremities: No LEE Pulses: 2+ and symmetric Skin: Warm and dry Neurologic: Grossly normal  Lab Results:   Recent Labs  08/05/14 0255 08/06/14 0846 08/07/14 0231  WBC 10.5 8.8 6.8  HGB 15.5 14.6 14.5  HCT 46.7 43.8 43.4  PLT 233 208 218  BMET  Recent Labs  08/05/14 0255 08/06/14 0846 08/07/14 0231  NA 137 131* 139  K 4.1 3.9 3.9  CL 109 104 110  CO2 23 19 25   GLUCOSE 116* 115* 186*  BUN 10 7 5*  CREATININE 0.88 0.93 0.84  CALCIUM 8.5 8.3* 9.0   PT/INR  Recent Labs  08/04/14 1608  LABPROT 14.1  INR 1.07   Cholesterol  Recent Labs  08/05/14 0845  CHOL 120   Lipid Panel     Component Value Date/Time   CHOL 120 08/05/2014 0845   TRIG 105 08/05/2014 0845   HDL 27* 08/05/2014 0845   CHOLHDL 4.4 08/05/2014 0845   VLDL 21 08/05/2014 0845   LDLCALC 72 08/05/2014 0845    Assessment/Plan  Active Problems:   Pancreatitis   Abdominal pain   Atrial flutter, unspecified   Atrial fibrillation with rapid ventricular response   COLD (chronic obstructive lung disease)     CP overnight, sharp and only lasted a couple minutes.  No radiation.  Sounds noncardiac.  Negative  troponin.    Aflutter - anticoagulation - Eliquis - worried about cost. Please have case mgt discuss with him.  - after 4 weeks of Eliquis, cardiovert.  - Rate controlled currently on dilt IV. Changing to PO dilt today per Primary team.  - Normally with EF 40-45% would like to use beta blocker but in his case, may have tachycardia induced cardiomyopathy and EF could improve with current treatment.   Cardiomyopathy - ?ETOH, Tachy induced  Hypotension  Continue to monitor.     LOS: 3 days    HAGER, BRYAN PA-C 08/07/2014 11:21 AM  Personally seen and examined. Agree with above. Stable. Neg Trop.  Rate controlled Eliquis  Will sign off. Will have follow up with Dr. Acie Fredrickson in 3 weeks.   Candee Furbish, MD

## 2014-08-07 NOTE — Progress Notes (Signed)
Family Medicine Teaching Service Daily Progress Note Intern Pager: [phone]  Patient name: Timothy Martinez Medical record number: [id number] Date of birth: 29-Dec-1950 Age: 64 y.o. Gender: male  Primary Care Provider: Phill Myron, MD Consultants: none Code Status: full  Pt Overview and Major Events to Date:  1/12 - admit for acute on chronic pancreatitis and atrial flutter  Assessment and Plan:  Timothy Martinez is a 64 y.o. male presenting with 3 day history of abdominal pain. PMH is significant for hx pancreatitis secondary to ethanol abuse, COPD, DM, HLD, GERD.  # Suspected acute on chronic pancreatitis: initial location of pain not consistent but on exam pt is more tender in epigastrium. CT scan of abdomen noted for intramural pancreatic pseudocyst, inflammtory changes; but noted to have normal lipase. Vitals stable - Clear liq diet - will advance to full liquids this AM and ADAT throughout the day  - Pain control: Oxy IR 5mg  q4h prn - required 1 dose O/N. - Will switch IV PPI back to home ranitidine    # Atrial flutter: Rate controlled currently. developed asx tachycardia after CT. Given adenosine and repeat EKG demonstrated flutter waves. CHADs-vasc 1. BPs soft O/N. - diltiazem drip - will transition to PO Dilt 75mg  q6h today - Consult cardiology - appreciate recs - On Eliquis - cardiovert after 4 weeks - likely not good long-term anticoag candidate - troponins neg x3 - echo (1/12) - EF 40-45%, hypokinesis, unable to assess diastolic function 2/2 a flutter - can consider d/c LABA in dulera - lipid panel - ASCVD 10 yr risk of 25.8% - continue atorvastatin 40 daily  # Chest pain: Resolved. Episode of L sided CP O/N. - CXR pending - EKG - a flutter, nonischemic - Troponin pending  # Alcohol abuse: UDS positive for opiates (noted to have received this as prescription, last filled in August per Epic review) - CIWA protocol - scores 0 recently  #Blood tinged stool: Reported on  admission. Nonrecurrent - Monitor hemoglobin - currently stable (14.5 this AM)  # T2DM: last a1c 07/2013 6.3, 7.5 on admission. Not currently taking any medications - CBG monitoring - 108-154 O/N - Consider starting metformin on discharge  FEN/GI: Full liq diet ADAT, KVO Prophylaxis: heparin sq  Disposition: pending transition to PO, pain control, cards recs  Subjective:  Abdominal pain improving, is "starving". CP resolved, thinks it may have been high abdominal pain.  Objective: Temp:  [97.2 F (36.2 C)-98.2 F (36.8 C)] 98 F (36.7 C) (01/14 0400) Pulse Rate:  [42-118] 51 (01/14 0600) Resp:  [12-30] 22 (01/14 0600) BP: (84-113)/(43-69) 108/69 mmHg (01/14 0600) SpO2:  [89 %-99 %] 96 % (01/14 0600) Physical Exam: General: NAD, sitting in bed appears comfortable Cardiovascular: reg rate, irregular rhythm, no murmur appreciated. 2+ radial and PT pulse bilaterally Respiratory: CTAB, normal effort. Abdomen: soft, mildly TTP in epigstrium and RUQ, no rebound. Bowel sounds normal Extremities: no edema or cyanosis. WWP. Neuro: alert and oriented, no focal deficits.  Laboratory:  Recent Labs Lab 08/05/14 0255 08/06/14 0846 08/07/14 0231  WBC 10.5 8.8 6.8  HGB 15.5 14.6 14.5  HCT 46.7 43.8 43.4  PLT 233 208 218    Recent Labs Lab 08/04/14 1608  08/05/14 0255 08/06/14 0846 08/07/14 0231  NA 133*  < > 137 131* 139  K 5.2*  < > 4.1 3.9 3.9  CL 105  < > 109 104 110  CO2 23  --  23 19 25   BUN 15  < >  10 7 5*  CREATININE 1.02  < > 0.88 0.93 0.84  CALCIUM 9.1  --  8.5 8.3* 9.0  PROT 7.6  --  6.7  --   --   BILITOT 1.4*  --  1.0  --   --   ALKPHOS 80  --  64  --   --   ALT 30  --  21  --   --   AST 39*  --  21  --   --   GLUCOSE 143*  < > 116* 115* 186*  < > = values in this interval not displayed.  Drugs of Abuse     Component Value Date/Time   LABOPIA POSITIVE* 08/04/2014 1712   COCAINSCRNUR NONE DETECTED 08/04/2014 1712   LABBENZ NONE DETECTED 08/04/2014  1712   AMPHETMU NONE DETECTED 08/04/2014 1712   THCU NONE DETECTED 08/04/2014 1712   LABBARB NONE DETECTED 08/04/2014 1712     Lipid Panel     Component Value Date/Time   CHOL 120 08/05/2014 0845   TRIG 105 08/05/2014 0845   HDL 27* 08/05/2014 0845   CHOLHDL 4.4 08/05/2014 0845   VLDL 21 08/05/2014 0845   LDLCALC 72 08/05/2014 0845    Troponins neg x3  A1c 7.5  Lipase 45 LFTs wnl  Imaging/Diagnostic Tests: Ct Abdomen Pelvis W Contrast (1/11): 1. Intramural pancreatic pseudocyst in the second portion of the duodenum. Slight inflammatory changes in the adjacent soft tissues are consistent with pancreatitis or inflammation secondary to this cyst. 2. Chronic pancreatitis with chronic dilatation of the pancreatic duct. 3. Severe chronic lung disease.   EKG (1/11): atrial flutter  Echo (1/12): EF 40-45%, hypokinesis, unable to assess diastolic function 2/2 a flutter   Lavon Paganini, MD 08/07/2014, 7:27 AM PGY-1, [place] Intern pager: [phone], text pages welcome

## 2014-08-08 DIAGNOSIS — I4892 Unspecified atrial flutter: Secondary | ICD-10-CM | POA: Diagnosis not present

## 2014-08-08 DIAGNOSIS — I4891 Unspecified atrial fibrillation: Secondary | ICD-10-CM | POA: Diagnosis not present

## 2014-08-08 DIAGNOSIS — K858 Other acute pancreatitis: Secondary | ICD-10-CM | POA: Diagnosis not present

## 2014-08-08 DIAGNOSIS — Z720 Tobacco use: Secondary | ICD-10-CM | POA: Diagnosis not present

## 2014-08-08 DIAGNOSIS — E119 Type 2 diabetes mellitus without complications: Secondary | ICD-10-CM | POA: Diagnosis not present

## 2014-08-08 LAB — GLUCOSE, CAPILLARY
Glucose-Capillary: 147 mg/dL — ABNORMAL HIGH (ref 70–99)
Glucose-Capillary: 182 mg/dL — ABNORMAL HIGH (ref 70–99)
Glucose-Capillary: 175 mg/dL — ABNORMAL HIGH (ref 70–99)
Glucose-Capillary: 162 mg/dL — ABNORMAL HIGH (ref 70–99)

## 2014-08-08 MED ORDER — FOLIC ACID 1 MG PO TABS
1.0000 mg | ORAL_TABLET | Freq: Every day | ORAL | Status: DC
  Administered 2014-08-08 – 2014-08-10 (×3): 1 mg via ORAL
  Filled 2014-08-08 (×3): qty 1

## 2014-08-08 MED ORDER — FAMOTIDINE 40 MG PO TABS
40.0000 mg | ORAL_TABLET | Freq: Two times a day (BID) | ORAL | Status: DC
  Administered 2014-08-08 – 2014-08-10 (×5): 40 mg via ORAL
  Filled 2014-08-08 (×7): qty 1

## 2014-08-08 MED ORDER — VITAMIN B-1 100 MG PO TABS
100.0000 mg | ORAL_TABLET | Freq: Every day | ORAL | Status: DC
  Administered 2014-08-08 – 2014-08-10 (×3): 100 mg via ORAL
  Filled 2014-08-08 (×3): qty 1

## 2014-08-08 MED ORDER — LORAZEPAM 2 MG/ML IJ SOLN
1.0000 mg | Freq: Four times a day (QID) | INTRAMUSCULAR | Status: DC | PRN
  Administered 2014-08-09: 1 mg via INTRAVENOUS
  Filled 2014-08-08: qty 1

## 2014-08-08 MED ORDER — NICOTINE 21 MG/24HR TD PT24
21.0000 mg | MEDICATED_PATCH | Freq: Every day | TRANSDERMAL | Status: DC
  Administered 2014-08-08 – 2014-08-10 (×3): 21 mg via TRANSDERMAL
  Filled 2014-08-08 (×3): qty 1

## 2014-08-08 MED ORDER — LORAZEPAM 1 MG PO TABS
1.0000 mg | ORAL_TABLET | Freq: Four times a day (QID) | ORAL | Status: DC | PRN
Start: 2014-08-08 — End: 2014-08-10
  Administered 2014-08-08: 1 mg via ORAL
  Filled 2014-08-08: qty 1

## 2014-08-08 MED ORDER — METOPROLOL TARTRATE 25 MG PO TABS
25.0000 mg | ORAL_TABLET | Freq: Two times a day (BID) | ORAL | Status: DC
  Administered 2014-08-08 (×3): 25 mg via ORAL
  Filled 2014-08-08 (×5): qty 1

## 2014-08-08 MED ORDER — ADULT MULTIVITAMIN W/MINERALS CH
1.0000 | ORAL_TABLET | Freq: Every day | ORAL | Status: DC
  Administered 2014-08-08 – 2014-08-10 (×3): 1 via ORAL
  Filled 2014-08-08 (×3): qty 1

## 2014-08-08 NOTE — Progress Notes (Signed)
Family Medicine Teaching Service Daily Progress Note Intern Pager: [phone]  Patient name: Timothy Martinez Medical record number: [id number] Date of birth: Jul 20, 1951 Age: 64 y.o. Gender: male  Primary Care Provider: Phill Myron, MD Consultants: none Code Status: full  Pt Overview and Major Events to Date:  1/12 - admit for acute on chronic pancreatitis and atrial flutter  Assessment and Plan:  Timothy Martinez is a 64 y.o. male presenting with 3 day history of abdominal pain. PMH is significant for hx pancreatitis secondary to ethanol abuse, COPD, DM, HLD, GERD.  # Suspected acute on chronic pancreatitis: Resolved. CT scan of abdomen noted for intramural pancreatic pseudocyst, inflammtory changes; but noted to have normal lipase.  - Tolerating PO - continue carb mod/heart healthy diet - Pain control: Oxy IR 5mg  q4h prn - required 2 dose O/N. - Continue home ranitidine    # Atrial flutter: Rate controlled currently. developed asx tachycardia after CT. Given adenosine and repeat EKG demonstrated flutter waves. CHADs-vasc 1. BPs soft O/N. - Continue Dilt 60mg  q6h and Metoprolol 25mg  BID - Consult cardiology - appreciate recs - On Eliquis - cardiovert after 4 weeks - likely not good long-term anticoag candidate - troponins neg x3 - echo (1/12) - EF 40-45%, hypokinesis, unable to assess diastolic function 2/2 a flutter - can consider d/c LABA in dulera - lipid panel - ASCVD 10 yr risk of 25.8% - continue atorvastatin 40 daily  # Alcohol abuse: UDS positive for opiates (noted to have received this as prescription, last filled in August per Epic review) - CIWA protocol - scores 0 recently  #Blood tinged stool: Reported on admission. Nonrecurrent - Monitor hemoglobin - currently stable (14.5 this AM)  # T2DM: last a1c 07/2013 6.3, 7.5 on admission. Not currently taking any medications - CBG monitoring - 131-218 O/N - Sensitive SSI - Will start metformin on discharge  FEN/GI: Heart  healthy/carb mod diet, KVO Prophylaxis: heparin sq  Disposition: pending rate control. Transfer to tele out of SDU today.  Subjective:  Abdominal pain improved. Tolerating PO. No CP, +palpitations.  Objective: Temp:  [97.7 F (36.5 C)-98.2 F (36.8 C)] 97.7 F (36.5 C) (01/15 0754) Pulse Rate:  [53-130] 97 (01/15 0754) Resp:  [15-26] 20 (01/15 0754) BP: (99-113)/(56-70) 113/66 mmHg (01/15 0754) SpO2:  [95 %-99 %] 99 % (01/15 0918) Physical Exam: General: NAD, sitting in bed appears comfortable Cardiovascular: reg rate, irregular rhythm, no murmur appreciated. 2+ radial and PT pulse bilaterally Respiratory: CTAB, normal effort. Abdomen: soft, NDNT, no rebound. Bowel sounds normal Extremities: no edema or cyanosis. WWP. Neuro: alert and oriented, no focal deficits.  Laboratory:  Recent Labs Lab 08/05/14 0255 08/06/14 0846 08/07/14 0231  WBC 10.5 8.8 6.8  HGB 15.5 14.6 14.5  HCT 46.7 43.8 43.4  PLT 233 208 218    Recent Labs Lab 08/04/14 1608  08/05/14 0255 08/06/14 0846 08/07/14 0231  NA 133*  < > 137 131* 139  K 5.2*  < > 4.1 3.9 3.9  CL 105  < > 109 104 110  CO2 23  --  23 19 25   BUN 15  < > 10 7 5*  CREATININE 1.02  < > 0.88 0.93 0.84  CALCIUM 9.1  --  8.5 8.3* 9.0  PROT 7.6  --  6.7  --   --   BILITOT 1.4*  --  1.0  --   --   ALKPHOS 80  --  64  --   --   ALT  30  --  21  --   --   AST 39*  --  21  --   --   GLUCOSE 143*  < > 116* 115* 186*  < > = values in this interval not displayed.  Drugs of Abuse     Component Value Date/Time   LABOPIA POSITIVE* 08/04/2014 1712   COCAINSCRNUR NONE DETECTED 08/04/2014 1712   LABBENZ NONE DETECTED 08/04/2014 1712   AMPHETMU NONE DETECTED 08/04/2014 1712   THCU NONE DETECTED 08/04/2014 1712   LABBARB NONE DETECTED 08/04/2014 1712     Lipid Panel     Component Value Date/Time   CHOL 120 08/05/2014 0845   TRIG 105 08/05/2014 0845   HDL 27* 08/05/2014 0845   CHOLHDL 4.4 08/05/2014 0845   VLDL 21 08/05/2014  0845   LDLCALC 72 08/05/2014 0845    Troponins neg x4  A1c 7.5  Lipase 45 LFTs wnl  Imaging/Diagnostic Tests: Ct Abdomen Pelvis W Contrast (1/11): 1. Intramural pancreatic pseudocyst in the second portion of the duodenum. Slight inflammatory changes in the adjacent soft tissues are consistent with pancreatitis or inflammation secondary to this cyst. 2. Chronic pancreatitis with chronic dilatation of the pancreatic duct. 3. Severe chronic lung disease.   EKG (1/11): atrial flutter  Echo (1/12): EF 40-45%, hypokinesis, unable to assess diastolic function 2/2 a flutter  CXR (1/14): 1. Unusual appearance of the lungs, as detailed above. If there is evidence of acute illness, these findings would be favored to reflect an acute bronchitis, potentially with developing multilobar bronchopneumonia. If symptoms are more insidious and onset, the possibility of interstitial lung disease warrants consideration. Followup nonemergent high-resolution chest CT could be obtained if there is clinical concern for interstitial lung disease, preferably after resolution of the patient's acute symptoms. 2. Atherosclerosis.   Lavon Paganini, MD 08/08/2014, 11:43 AM PGY-1, [place] Intern pager: [phone], text pages welcome

## 2014-08-08 NOTE — Progress Notes (Signed)
Pts Heart rate 140s sustained. IMTS made aware. Will monitor pt.

## 2014-08-09 DIAGNOSIS — K858 Other acute pancreatitis: Secondary | ICD-10-CM | POA: Diagnosis not present

## 2014-08-09 DIAGNOSIS — I4892 Unspecified atrial flutter: Secondary | ICD-10-CM | POA: Diagnosis not present

## 2014-08-09 DIAGNOSIS — E119 Type 2 diabetes mellitus without complications: Secondary | ICD-10-CM | POA: Diagnosis not present

## 2014-08-09 DIAGNOSIS — I4891 Unspecified atrial fibrillation: Secondary | ICD-10-CM | POA: Diagnosis not present

## 2014-08-09 DIAGNOSIS — K859 Acute pancreatitis, unspecified: Secondary | ICD-10-CM | POA: Diagnosis not present

## 2014-08-09 DIAGNOSIS — I429 Cardiomyopathy, unspecified: Secondary | ICD-10-CM | POA: Diagnosis not present

## 2014-08-09 DIAGNOSIS — I959 Hypotension, unspecified: Secondary | ICD-10-CM | POA: Diagnosis not present

## 2014-08-09 LAB — GLUCOSE, CAPILLARY
Glucose-Capillary: 187 mg/dL — ABNORMAL HIGH (ref 70–99)
Glucose-Capillary: 222 mg/dL — ABNORMAL HIGH (ref 70–99)
Glucose-Capillary: 197 mg/dL — ABNORMAL HIGH (ref 70–99)
Glucose-Capillary: 219 mg/dL — ABNORMAL HIGH (ref 70–99)

## 2014-08-09 LAB — CBC
HCT: 48.1 % (ref 39.0–52.0)
Hemoglobin: 16.2 g/dL (ref 13.0–17.0)
MCH: 31.5 pg (ref 26.0–34.0)
MCHC: 33.7 g/dL (ref 30.0–36.0)
MCV: 93.4 fL (ref 78.0–100.0)
Platelets: 241 10*3/uL (ref 150–400)
RBC: 5.15 MIL/uL (ref 4.22–5.81)
RDW: 13.8 % (ref 11.5–15.5)
WBC: 8.6 10*3/uL (ref 4.0–10.5)

## 2014-08-09 LAB — BASIC METABOLIC PANEL
Anion gap: 8 (ref 5–15)
BUN: 11 mg/dL (ref 6–23)
CO2: 26 mmol/L (ref 19–32)
Calcium: 10 mg/dL (ref 8.4–10.5)
Chloride: 102 mEq/L (ref 96–112)
Creatinine, Ser: 1.01 mg/dL (ref 0.50–1.35)
GFR calc Af Amer: >90 mL/min (ref >90)
GFR calc non Af Amer: 78 mL/min — ABNORMAL LOW (ref >90)
Glucose, Bld: 193 mg/dL — ABNORMAL HIGH (ref 70–99)
Potassium: 4.2 mmol/L (ref 3.5–5.1)
Sodium: 136 mmol/L (ref 135–145)

## 2014-08-09 MED ORDER — METOPROLOL TARTRATE 50 MG PO TABS
50.0000 mg | ORAL_TABLET | Freq: Two times a day (BID) | ORAL | Status: DC
  Administered 2014-08-09 – 2014-08-10 (×3): 50 mg via ORAL
  Filled 2014-08-09 (×5): qty 1

## 2014-08-09 MED ORDER — HYDROMORPHONE HCL 1 MG/ML IJ SOLN
0.5000 mg | Freq: Once | INTRAMUSCULAR | Status: AC
  Administered 2014-08-09: 0.5 mg via INTRAVENOUS
  Filled 2014-08-09: qty 1

## 2014-08-09 MED ORDER — POLYETHYLENE GLYCOL 3350 17 G PO PACK
17.0000 g | PACK | Freq: Every day | ORAL | Status: DC
  Administered 2014-08-09 – 2014-08-10 (×2): 17 g via ORAL
  Filled 2014-08-09 (×2): qty 1

## 2014-08-09 NOTE — Progress Notes (Signed)
Family Medicine Teaching Service Daily Progress Note Intern Pager: [phone]  Patient name: Timothy Martinez Medical record number: [id number] Date of birth: 07/08/1951 Age: 64 y.o. Gender: male  Primary Care Provider: Phill Myron, MD Consultants: none Code Status: full  Pt Overview and Major Events to Date:  1/12 - admit for acute on chronic pancreatitis and atrial flutter  Assessment and Plan:  Timothy Martinez is a 64 y.o. male presenting with 3 day history of abdominal pain. PMH is significant for hx pancreatitis secondary to ethanol abuse, COPD, DM, HLD, GERD.  # Suspected acute on chronic pancreatitis: Resolved. CT scan of abdomen noted for intramural pancreatic pseudocyst, inflammtory changes; but noted to have normal lipase.  - Tolerating PO - continue carb mod/heart healthy diet - Pain control: Oxy IR 5mg  q4h prn - required 2 dose O/N. - Continue home ranitidine    # Atrial flutter: Rate controlled currently. developed asx tachycardia after CT. Given adenosine and repeat EKG demonstrated flutter waves. CHADs-vasc 1. BPs soft O/N. - Continue Dilt 60mg  q6h and increase Metoprolol 50mg  BID - Consult cardiology - appreciate recs - On Eliquis - cardiovert after 4 weeks - likely not good long-term anticoag candidate - troponins neg x3 - echo (1/12) - EF 40-45%, hypokinesis, unable to assess diastolic function 2/2 a flutter - can consider d/c LABA in dulera - lipid panel - ASCVD 10 yr risk of 25.8% - continue atorvastatin 40 daily  # Alcohol abuse: UDS positive for opiates (noted to have received this as prescription, last filled in August per Epic review) - CIWA protocol - scores 0 recently  #Blood tinged stool: Reported on admission. Nonrecurrent - Monitor hemoglobin - currently stable (14.5 this AM)  # T2DM: last a1c 07/2013 6.3, 7.5 on admission. Not currently taking any medications - CBG monitoring - 131-218 O/N - Sensitive SSI - Will start metformin on discharge  FEN/GI:  Heart healthy/carb mod diet, KVO Prophylaxis: heparin sq  Disposition: pending rate control, likely 1/17  Subjective:  Reports no abdominal pain, no diarrhea. Has not had BM since Monday. Reports palpitations upon standing, has occasional chest pain that improves with oxy.  Objective: Temp:  [97.4 F (36.3 C)-98.3 F (36.8 C)] 97.6 F (36.4 C) (01/16 0445) Pulse Rate:  [57-134] 69 (01/16 1014) Resp:  [18-22] 18 (01/16 0445) BP: (93-117)/(47-84) 100/65 mmHg (01/16 1014) SpO2:  [96 %-100 %] 98 % (01/16 1006) Weight:  [192 lb 3.7 oz (87.195 kg)-196 lb (88.905 kg)] 192 lb 3.7 oz (87.195 kg) (01/16 0445) Physical Exam: General: NAD, standing and moves to sitting on side of bed, appears comfortable Cardiovascular: Rate regular after sitting >5 minutes, no murmurs appreciated. 2+ radial pulses b/l Respiratory: CTAB, normal effort. Abdomen: soft, minimal tenderness without rebound or guarding. Bowel sounds normal Extremities: no edema or cyanosis. WWP. Neuro: alert and oriented, no focal deficits.  Laboratory:  Recent Labs Lab 08/05/14 0255 08/06/14 0846 08/07/14 0231  WBC 10.5 8.8 6.8  HGB 15.5 14.6 14.5  HCT 46.7 43.8 43.4  PLT 233 208 218    Recent Labs Lab 08/04/14 1608  08/05/14 0255 08/06/14 0846 08/07/14 0231  NA 133*  < > 137 131* 139  K 5.2*  < > 4.1 3.9 3.9  CL 105  < > 109 104 110  CO2 23  --  23 19 25   BUN 15  < > 10 7 5*  CREATININE 1.02  < > 0.88 0.93 0.84  CALCIUM 9.1  --  8.5 8.3* 9.0  PROT 7.6  --  6.7  --   --   BILITOT 1.4*  --  1.0  --   --   ALKPHOS 80  --  64  --   --   ALT 30  --  21  --   --   AST 39*  --  21  --   --   GLUCOSE 143*  < > 116* 115* 186*  < > = values in this interval not displayed.  Drugs of Abuse     Component Value Date/Time   LABOPIA POSITIVE* 08/04/2014 1712   COCAINSCRNUR NONE DETECTED 08/04/2014 1712   LABBENZ NONE DETECTED 08/04/2014 1712   AMPHETMU NONE DETECTED 08/04/2014 1712   THCU NONE DETECTED 08/04/2014  1712   LABBARB NONE DETECTED 08/04/2014 1712     Lipid Panel     Component Value Date/Time   CHOL 120 08/05/2014 0845   TRIG 105 08/05/2014 0845   HDL 27* 08/05/2014 0845   CHOLHDL 4.4 08/05/2014 0845   VLDL 21 08/05/2014 0845   LDLCALC 72 08/05/2014 0845    Troponins neg x4  A1c 7.5  Lipase 45 LFTs wnl  Imaging/Diagnostic Tests: Ct Abdomen Pelvis W Contrast (1/11): 1. Intramural pancreatic pseudocyst in the second portion of the duodenum. Slight inflammatory changes in the adjacent soft tissues are consistent with pancreatitis or inflammation secondary to this cyst. 2. Chronic pancreatitis with chronic dilatation of the pancreatic duct. 3. Severe chronic lung disease.   EKG (1/11): atrial flutter  Echo (1/12): EF 40-45%, hypokinesis, unable to assess diastolic function 2/2 a flutter  CXR (1/14): 1. Unusual appearance of the lungs, as detailed above. If there is evidence of acute illness, these findings would be favored to reflect an acute bronchitis, potentially with developing multilobar bronchopneumonia. If symptoms are more insidious and onset, the possibility of interstitial lung disease warrants consideration. Followup nonemergent high-resolution chest CT could be obtained if there is clinical concern for interstitial lung disease, preferably after resolution of the patient's acute symptoms. 2. Atherosclerosis.   Leone Brand, MD 08/09/2014, 10:38 AM PGY-2, [place] Intern pager: [phone], text pages welcome

## 2014-08-09 NOTE — Progress Notes (Signed)
FPTS Interim Progress Note  Paged by nurse for pt complaining of heart flutter and chest pain 8/10. Pt reports pain is similar to his pancreatitis pain that occurred last Tuesday. Says he ate 2 packets of seasoning with his lunch that contain cayenne pepper and thinks that even eating something like that can set off his pancreatitis. Pt received 0.5mg  dilaudid and now reports the pain is completely resolved  O: BP 118/73 mmHg  Pulse 68  Temp(Src) 98.1 F (36.7 C) (Oral)  Resp 20  Ht 6\' 3"  (1.905 m)  Wt 192 lb 3.7 oz (87.195 kg)  BMI 24.03 kg/m2  SpO2 97%  General: layingi n bed, NAD CV: RRR, normal s1s2 no mrg Chest nontender to palpation Resp: clear bilaterally  A/P: Suspect esophageal/pancreatic etiology. Repeat EKG does show aflutter. No further changes at this point, if pain develops again will consider cycling troponins.  Tawanna Sat, MD 08/09/2014, 5:08 PM PGY-2, [place]

## 2014-08-10 DIAGNOSIS — E119 Type 2 diabetes mellitus without complications: Secondary | ICD-10-CM | POA: Diagnosis not present

## 2014-08-10 DIAGNOSIS — I4891 Unspecified atrial fibrillation: Secondary | ICD-10-CM | POA: Diagnosis not present

## 2014-08-10 DIAGNOSIS — K858 Other acute pancreatitis: Secondary | ICD-10-CM | POA: Diagnosis not present

## 2014-08-10 LAB — GLUCOSE, CAPILLARY
Glucose-Capillary: 245 mg/dL — ABNORMAL HIGH (ref 70–99)
Glucose-Capillary: 237 mg/dL — ABNORMAL HIGH (ref 70–99)

## 2014-08-10 MED ORDER — FAMOTIDINE 40 MG PO TABS: 40.0000 mg | ORAL_TABLET | Freq: Every day | ORAL | Status: DC

## 2014-08-10 MED ORDER — METFORMIN HCL 500 MG PO TABS: 500.0000 mg | ORAL_TABLET | Freq: Two times a day (BID) | ORAL | Status: DC

## 2014-08-10 MED ORDER — METOPROLOL TARTRATE 50 MG PO TABS: 50.0000 mg | ORAL_TABLET | Freq: Two times a day (BID) | ORAL | Status: DC

## 2014-08-10 MED ORDER — DILTIAZEM HCL ER 120 MG PO CP12: 120.0000 mg | ORAL_CAPSULE | Freq: Two times a day (BID) | ORAL | Status: DC

## 2014-08-10 MED ORDER — OXYCODONE-ACETAMINOPHEN 5-325 MG PO TABS: ORAL_TABLET | ORAL | Status: DC

## 2014-08-10 NOTE — Progress Notes (Signed)
Family Medicine Teaching Service Daily Progress Note Intern Pager: [phone]  Patient name: Timothy Martinez Medical record number: [id number] Date of birth: October 11, 1950 Age: 64 y.o. Gender: male  Primary Care Provider: Phill Myron, MD Consultants: none Code Status: full  Pt Overview and Major Events to Date:  1/12 - admit for acute on chronic pancreatitis and atrial flutter  Assessment and Plan:  Timothy Martinez is a 64 y.o. male presenting with 3 day history of abdominal pain. PMH is significant for hx pancreatitis secondary to ethanol abuse, COPD, DM, HLD, GERD.  # Suspected acute on chronic pancreatitis: Resolved. CT scan of abdomen noted for intramural pancreatic pseudocyst, inflammtory changes; but noted to have normal lipase.  - Tolerating PO - continue carb mod/heart healthy diet - Pain control: Oxy IR 5mg  q4h prn - required 2 dose O/N. - Continue home ranitidine    # Atrial flutter: Rate controlled currently. developed asx tachycardia after CT. Given adenosine and repeat EKG demonstrated flutter waves. CHADs-vasc 1. BPs soft O/N. - Consult cardiology - appreciate recs - On Eliquis - cardiovert after 4 weeks - likely not good long-term anticoag candidate - troponins neg x3 - echo (1/12) - EF 40-45%, hypokinesis, unable to assess diastolic function 2/2 a flutter - can consider d/c LABA in dulera - lipid panel - ASCVD 10 yr risk of 25.8% - continue atorvastatin 40 daily - Continue Dilt 60mg  q6h and increase Metoprolol 50mg  BID - discharge on 12hr diltiazem tablet  # Alcohol abuse: UDS positive for opiates (noted to have received this as prescription, last filled in August per Epic review) - CIWA protocol - scores 0 recently  #Blood tinged stool: Reported on admission. Nonrecurrent - Monitor hemoglobin - currently stable (14.5 this AM)  # T2DM: last a1c 07/2013 6.3, 7.5 on admission. Not currently taking any medications - Sensitive SSI - Will start metformin on  discharge  FEN/GI: Heart healthy/carb mod diet, KVO Prophylaxis: heparin sq  Disposition: pending rate control, likely 1/17  Subjective:  Doing well this morning, no further episode of chest pain. Does not feel heart racing this morning. Wants to walk the floor and if he does well thinks he can go today.  Objective: Temp:  [97.6 F (36.4 C)-98.1 F (36.7 C)] 97.6 F (36.4 C) (01/17 0539) Pulse Rate:  [48-87] 48 (01/17 0539) Resp:  [18-20] 18 (01/17 0539) BP: (100-118)/(65-73) 108/65 mmHg (01/17 0539) SpO2:  [97 %-98 %] 98 % (01/17 0539) Weight:  [190 lb (86.183 kg)] 190 lb (86.183 kg) (01/17 0539) Physical Exam: General: NAD, standing and moves to sitting on side of bed, appears comfortable Cardiovascular: Rate regular, no murmurs appreciated. 2+ radial pulses b/l Respiratory: CTAB, normal effort. Abdomen: soft, minimal tenderness without rebound or guarding. Bowel sounds normal Extremities: no edema or cyanosis. WWP. Neuro: alert and oriented, no focal deficits.  Laboratory:  Recent Labs Lab 08/06/14 0846 08/07/14 0231 08/09/14 1251  WBC 8.8 6.8 8.6  HGB 14.6 14.5 16.2  HCT 43.8 43.4 48.1  PLT 208 218 241    Recent Labs Lab 08/04/14 1608  08/05/14 0255 08/06/14 0846 08/07/14 0231 08/09/14 1251  NA 133*  < > 137 131* 139 136  K 5.2*  < > 4.1 3.9 3.9 4.2  CL 105  < > 109 104 110 102  CO2 23  --  23 19 25 26   BUN 15  < > 10 7 5* 11  CREATININE 1.02  < > 0.88 0.93 0.84 1.01  CALCIUM 9.1  --  8.5 8.3* 9.0 10.0  PROT 7.6  --  6.7  --   --   --   BILITOT 1.4*  --  1.0  --   --   --   ALKPHOS 80  --  64  --   --   --   ALT 30  --  21  --   --   --   AST 39*  --  21  --   --   --   GLUCOSE 143*  < > 116* 115* 186* 193*  < > = values in this interval not displayed.  Drugs of Abuse     Component Value Date/Time   LABOPIA POSITIVE* 08/04/2014 1712   COCAINSCRNUR NONE DETECTED 08/04/2014 1712   LABBENZ NONE DETECTED 08/04/2014 1712   AMPHETMU NONE DETECTED  08/04/2014 1712   THCU NONE DETECTED 08/04/2014 1712   LABBARB NONE DETECTED 08/04/2014 1712     Lipid Panel     Component Value Date/Time   CHOL 120 08/05/2014 0845   TRIG 105 08/05/2014 0845   HDL 27* 08/05/2014 0845   CHOLHDL 4.4 08/05/2014 0845   VLDL 21 08/05/2014 0845   LDLCALC 72 08/05/2014 0845    Troponins neg x4  A1c 7.5  Lipase 45 LFTs wnl  Imaging/Diagnostic Tests: Ct Abdomen Pelvis W Contrast (1/11): 1. Intramural pancreatic pseudocyst in the second portion of the duodenum. Slight inflammatory changes in the adjacent soft tissues are consistent with pancreatitis or inflammation secondary to this cyst. 2. Chronic pancreatitis with chronic dilatation of the pancreatic duct. 3. Severe chronic lung disease.   EKG (1/11): atrial flutter  Echo (1/12): EF 40-45%, hypokinesis, unable to assess diastolic function 2/2 a flutter  CXR (1/14): 1. Unusual appearance of the lungs, as detailed above. If there is evidence of acute illness, these findings would be favored to reflect an acute bronchitis, potentially with developing multilobar bronchopneumonia. If symptoms are more insidious and onset, the possibility of interstitial lung disease warrants consideration. Followup nonemergent high-resolution chest CT could be obtained if there is clinical concern for interstitial lung disease, preferably after resolution of the patient's acute symptoms. 2. Atherosclerosis.   Leone Brand, MD 08/10/2014, 9:16 AM PGY-2, [place] Intern pager: [phone], text pages welcome

## 2014-08-10 NOTE — Discharge Instructions (Signed)
You were admitted for acute on chronic pancreatitis and also found to have a heart arrhythmia called atrial flutter. You were started on medications to slow your heart rate down, called diltiazem and metoprolol, that you will go home with. The cardiologists saw you and you were started on a blood thinner called eliquis, with a plan to try and fix your heart rhythm in 1 month with what is called a cardioversion.  Reasons to call the doctor or go to the emergency room: Persistent feelings of heart racing that is not improved with your medication or sitting down/resting Chest pains unrelieved by prescribed medications that last >10 minutes Dizziness or light headedness associated with a fast heart rate  You can measure your heart rate by checking the pulse in your wrist as demonstrated the day of discharge. Count the number of beats in 15 seconds, and multiply by 4. It is concerning if your heart rate stays >130 after sitting or laying down at rest for 10 minutes.  For your diabetes: We recommend that you start metformin 500mg  taken twice a day. You can start taking this medication once a day for a week to make sure you don't experience any side effects (primarily upset stomach or diarrhea). If you do well the first week, increase to twice a day. If you do have side effects continue with the once a day dosing for another 1-2 weeks, and then increase once the side effects have gotten better (they usually do). You do not need to start taking insulin yet, and you don't need to be checking your sugars daily. If your diabetes becomes less controlled in the future this may be a possibility.

## 2014-08-19 ENCOUNTER — Encounter: Payer: Self-pay | Admitting: Cardiovascular Disease

## 2014-08-19 ENCOUNTER — Ambulatory Visit (INDEPENDENT_AMBULATORY_CARE_PROVIDER_SITE_OTHER): Payer: Medicare Other | Admitting: Cardiovascular Disease

## 2014-08-19 VITALS — BP 110/78 | HR 93 | Ht 75.0 in | Wt 195.0 lb

## 2014-08-19 DIAGNOSIS — I4891 Unspecified atrial fibrillation: Secondary | ICD-10-CM | POA: Diagnosis not present

## 2014-08-19 DIAGNOSIS — I4892 Unspecified atrial flutter: Secondary | ICD-10-CM | POA: Diagnosis not present

## 2014-08-19 MED ORDER — APIXABAN 5 MG PO TABS: 5.0000 mg | ORAL_TABLET | Freq: Two times a day (BID) | ORAL | Status: DC

## 2014-08-19 NOTE — Patient Instructions (Signed)
Your physician has recommended you make the following change in your medication:   STOP TAKING ASPIRIN NOW  START TAKING ELIQUIS 5 MG TWICE DAILY    Your physician recommends that you return for lab work in: [place], [place]  (TO CHECK A BMET AND CBC W DIFF)   Your physician recommends that you schedule a follow-up appointment in: [place] [facility] SAME DAY

## 2014-08-19 NOTE — Progress Notes (Signed)
Cardiology Office Note   Date:  08/19/2014   ID:  Timothy Martinez, DOB 25-Dec-1950, MRN [id number]  PCP:  Dorcas Mcmurray, MD  Cardiologist:   Acie Fredrickson Wonda Cheng, MD   Chief Complaint  Patient presents with  . Atrial Fibrillation    post hospital      History of Present Illness: Timothy Martinez is a 64 y.o. male who presents for  Follow up of atrial flutter / fib .  I met him in the hospital with atrial flutter when he was admitted with acute pancreatitis.       Past Medical History  Diagnosis Date  . Pancreas disorder   . Diabetes mellitus   . Hyperlipidemia   . Shortness of breath     with ambulation  . Anginal pain   . Pneumonia   . GERD (gastroesophageal reflux disease)   . Headache(784.0)   . Arthritis     Past Surgical History  Procedure Laterality Date  . Back surgery    . Neck surgery      not cervical, states had lesion on neck which was removed  . Splenectomy       Current Outpatient Prescriptions  Medication Sig Dispense Refill  . albuterol (PROVENTIL HFA;VENTOLIN HFA) 108 (90 BASE) MCG/ACT inhaler Inhale 2 puffs into the lungs at bedtime as needed for wheezing or shortness of breath.    Marland Kitchen aspirin EC 81 MG tablet Take 81 mg by mouth daily.    Marland Kitchen atorvastatin (LIPITOR) 40 MG tablet TAKE 1 TABLET EVERY DAY 30 tablet 0  . diltiazem (CARDIZEM SR) 120 MG 12 hr capsule Take 1 capsule (120 mg total) by mouth 2 (two) times daily. 60 capsule 1  . famotidine (PEPCID) 40 MG tablet Take 1 tablet (40 mg total) by mouth daily. 30 tablet 1  . metFORMIN (GLUCOPHAGE) 500 MG tablet Take 1 tablet (500 mg total) by mouth 2 (two) times daily with a meal. 60 tablet 1  . metoprolol (LOPRESSOR) 50 MG tablet Take 1 tablet (50 mg total) by mouth 2 (two) times daily. 60 tablet 1  . mometasone-formoterol (DULERA) 200-5 MCG/ACT AERO Inhale 2 puffs into the lungs 2 (two) times daily. 2 Inhaler 0  . oxyCODONE-acetaminophen (ROXICET) 5-325 MG per tablet Take one half or one tablet twice  daily as needed for pain 10 tablet 0   No current facility-administered medications for this visit.    Allergies:   Tramadol; Morphine and related; and Penicillins    Social History:  The patient  reports that he has been smoking Cigarettes.  He started smoking about 50 years ago. He has a 50 pack-year smoking history. He has never used smokeless tobacco. He reports that he drinks alcohol. He reports that he does not use illicit drugs.   Family History:  The patient's family history includes Alcohol abuse in his father; Aneurysm in his mother; Diabetes in his mother; Hypertension in his mother; Stroke in his mother. There is no history of Heart attack.    ROS:  Please see the history of present illness.   Otherwise, review of systems are positive for none.   All other systems are reviewed and negative.    PHYSICAL EXAM: VS:  BP 110/78 mmHg  Pulse 93  Ht '6\' 3"'  (1.905 m)  Wt 195 lb (88.451 kg)  BMI 24.37 kg/m2 , BMI Body mass index is 24.37 kg/(m^2). GEN: Well nourished, well developed, in no acute distress HEENT: normal Neck: no JVD, carotid bruits, or  masses Cardiac: Irreg. Irreg. ; no murmurs, rubs, or gallops,no edema  Respiratory:  clear to auscultation bilaterally, normal work of breathing GI: soft, nontender, nondistended, + BS MS: no deformity or atrophy Skin: warm and dry, no rash Neuro:  Strength and sensation are intact Psych: normal   EKG:  EKG is ordered today. The ekg ordered today demonstrates atrial flutter at 93.  NS ST abn.    Recent Labs: 08/05/2014: ALT 21; Magnesium 1.9 08/09/2014: BUN 11; Creatinine 1.01; Hemoglobin 16.2; Platelets 241; Potassium 4.2; Sodium 136    Lipid Panel    Component Value Date/Time   CHOL 120 08/05/2014 0845   TRIG 105 08/05/2014 0845   HDL 27* 08/05/2014 0845   CHOLHDL 4.4 08/05/2014 0845   VLDL 21 08/05/2014 0845   LDLCALC 72 08/05/2014 0845      Wt Readings from Last 3 Encounters:  08/19/14 195 lb (88.451 kg)    08/10/14 190 lb (86.183 kg)  03/13/14 193 lb 4.8 oz (87.68 kg)      Other studies Reviewed: Additional studies/ records that were reviewed today include: DC summary from internal medicine resident. Review of the above records demonstrates: patient was not prescribed Eliquis on DC .    ASSESSMENT AND PLAN:  1. Atrial flutter: Pt was seen in the hospital with atial fib / flutter.  He was not prescribed Eliquis on DC from the hospital.   Will start today   He cannot tell that his heart is beating irregular. He does have some shortness of breath status due to smoking. He's never had any episodes of chest discomfort.  He is on disability. He does not get any exercise and does not work. We can we can consider cardioverting him after he's been on a month of anticoagulation. I stressed the importance of taking his anticoagulation on a daily basis prior to any cardioversion.  His rate is currently well controlled .  Will see him in 1 month with CBC and BMP.   Will anticipate setting up cardioversion at that time.   2. Chronic systolic congestive heart failure: Echocardiogram reveals mildly depressed left ventricular systolic function. I suspect that this is due to his chronic alcohol intake and also may be due to episodes of tachycardia.  He'll need further evaluation for ischemia. Will anticipate doing a stress Myoview study as an outpatient. We'll start him on low-dose Toprol-XL.  Will anticipate starting Losartan once he is cardioverted and off the diltiazem.    Current medicines are reviewed at length with the patient today.  The patient does not have concerns regarding medicines.  The following changes have been made:  no change  Labs/ tests ordered today include:  No orders of the defined types were placed in this encounter.     Disposition:   FU with me in 1 month.  Will set up cardioversion at that time.     Signed, Kymari Lollis, Wonda Cheng, MD  08/19/2014 3:46 PM    [facility] [place], [place], [place]  [address] Phone: [phone]; Fax: [phone]

## 2014-08-22 ENCOUNTER — Telehealth: Payer: Self-pay | Admitting: Family Medicine

## 2014-08-22 ENCOUNTER — Ambulatory Visit (INDEPENDENT_AMBULATORY_CARE_PROVIDER_SITE_OTHER): Payer: Medicare Other | Admitting: Family Medicine

## 2014-08-22 ENCOUNTER — Encounter: Payer: Self-pay | Admitting: Family Medicine

## 2014-08-22 VITALS — BP 102/62 | HR 77 | Temp 97.9°F | Ht 75.0 in | Wt 191.0 lb

## 2014-08-22 DIAGNOSIS — E119 Type 2 diabetes mellitus without complications: Secondary | ICD-10-CM

## 2014-08-22 DIAGNOSIS — K219 Gastro-esophageal reflux disease without esophagitis: Secondary | ICD-10-CM

## 2014-08-22 DIAGNOSIS — J449 Chronic obstructive pulmonary disease, unspecified: Secondary | ICD-10-CM

## 2014-08-22 DIAGNOSIS — I4891 Unspecified atrial fibrillation: Secondary | ICD-10-CM | POA: Diagnosis not present

## 2014-08-22 MED ORDER — ONETOUCH LANCETS MISC: Status: DC

## 2014-08-22 MED ORDER — METFORMIN HCL 500 MG PO TABS: 1000.0000 mg | ORAL_TABLET | Freq: Two times a day (BID) | ORAL | Status: DC

## 2014-08-22 MED ORDER — ACCU-CHEK FASTCLIX LANCETS MISC: Status: DC

## 2014-08-22 MED ORDER — ALBUTEROL SULFATE HFA 108 (90 BASE) MCG/ACT IN AERS: 2.0000 | INHALATION_SPRAY | Freq: Every evening | RESPIRATORY_TRACT | Status: DC | PRN

## 2014-08-22 MED ORDER — MOMETASONE FURO-FORMOTEROL FUM 200-5 MCG/ACT IN AERO: 2.0000 | INHALATION_SPRAY | Freq: Two times a day (BID) | RESPIRATORY_TRACT | Status: DC

## 2014-08-22 MED ORDER — OMEPRAZOLE 20 MG PO CPDR: 20.0000 mg | DELAYED_RELEASE_CAPSULE | Freq: Every day | ORAL | Status: DC

## 2014-08-22 MED ORDER — GLUCOSE BLOOD VI STRP: ORAL_STRIP | Status: DC

## 2014-08-22 MED ORDER — ACCU-CHEK NANO SMARTVIEW W/DEVICE KIT: PACK | Status: DC

## 2014-08-22 MED ORDER — ONETOUCH ULTRA 2 W/DEVICE KIT: PACK | Status: DC

## 2014-08-22 NOTE — Progress Notes (Signed)
   Subjective:   Timothy Martinez is a 64 y.o. male with a history of suicide secondary to ethanol abuse, COPD, T2 DM, HLD, GERD here for hospital follow-up.  Patient recently admitted to hospital from 1/11-1/17 for acute on chronic pancreatitis and atrial flutter.  - Pancreatitis: Patient reports pain at night when laying down. Sharp across upper abdomen.  Also has h/o GERD. Eating bland diet. No alcohol since discharge - reports that he wont ever drink again. Pain not related to eating.  - Atrial flutter: Patient currently rate controlled with  metoprolol and diltiazem. Currently taking Eliquis with plan for cardiology to cardiovert at the end of February. Of note, Eliquis not prescribed on discharge, so just prescribed by Cardiology.   - T2DM: A1c 7.5 during admission. Started on metformin 500 mg twice a day. Patient reports he's been tolerating medication well. Worried about what to eat, has been eatign soup for weeks.  Anxious about not knowning BG.  - COPD: Has been taking albuterol nightly despite no symptoms. Doesn't think the dulera helps in the moment.  Review of Systems:  Per HPI. All other systems reviewed and are negative.   PMH, PSH, Medications, Allergies, and FmHx reviewed and updated in EMR.  Social History: current smoker - working on cutting back slowly  Objective:  There were no vitals taken for this visit.  Gen:  64 y.o. male in NAD HEENT: NCAT, MMM, EOMI, PERRL, anicteric sclerae CV: Irregularly irregular rhythm, no MRG, no JVD Resp: Non-labored, CTAB, no wheezes noted Abd: Soft, NTND, BS present, no guarding or organomegaly Ext: WWP, no edema Neuro: Alert and oriented, speech normal      Chemistry      Component Value Date/Time   NA 136 08/09/2014 1251   K 4.2 08/09/2014 1251   CL 102 08/09/2014 1251   CO2 26 08/09/2014 1251   BUN 11 08/09/2014 1251   CREATININE 1.01 08/09/2014 1251   CREATININE 0.79 08/01/2013 0952      Component Value Date/Time   CALCIUM 10.0 08/09/2014 1251   ALKPHOS 64 08/05/2014 0255   AST 21 08/05/2014 0255   ALT 21 08/05/2014 0255   BILITOT 1.0 08/05/2014 0255      Lab Results  Component Value Date   WBC 8.6 08/09/2014   HGB 16.2 08/09/2014   HCT 48.1 08/09/2014   MCV 93.4 08/09/2014   PLT 241 08/09/2014   Lab Results  Component Value Date   TSH 3.210 05/29/2011   Lab Results  Component Value Date   HGBA1C 7.5* 08/05/2014   Assessment:     Timothy Martinez is a 64 y.o. male here for hospital f/u.    Plan:     See problem list for problem-specific plans.   Lavon Paganini, MD PGY-1,  [facility] 08/22/2014  2:10 PM

## 2014-08-22 NOTE — Patient Instructions (Addendum)
It was nice to see you again today. I think that your abdominal pain is related to GERD. We will switch from famotidine to Prilosec. Take this pill once daily. You can also try to elevate the head of your bed and avoid eating for 2 hours before you go to sleep at night. Avoid spicy foods and peppermint as these can worsen your symptoms.  I will increase your metformin to 2 pills twice daily. This is a pill for diabetes. I sent testing items to your pharmacy so you can check your blood sugar in the mornings when you wake up.  Follow up in clinic with your PCP in 1 month.  Take care, Dr. Jacinto Reap  Gastroesophageal Reflux Disease, Adult Gastroesophageal reflux disease (GERD) happens when acid from your stomach flows up into the esophagus. When acid comes in contact with the esophagus, the acid causes soreness (inflammation) in the esophagus. Over time, GERD may create small holes (ulcers) in the lining of the esophagus. CAUSES   Increased body weight. This puts pressure on the stomach, making acid rise from the stomach into the esophagus.  Smoking. This increases acid production in the stomach.  Drinking alcohol. This causes decreased pressure in the lower esophageal sphincter (valve or ring of muscle between the esophagus and stomach), allowing acid from the stomach into the esophagus.  Late evening meals and a full stomach. This increases pressure and acid production in the stomach.  A malformed lower esophageal sphincter. Sometimes, no cause is found. SYMPTOMS   Burning pain in the lower part of the mid-chest behind the breastbone and in the mid-stomach area. This may occur twice a week or more often.  Trouble swallowing.  Sore throat.  Dry cough.  Asthma-like symptoms including chest tightness, shortness of breath, or wheezing. DIAGNOSIS  Your caregiver may be able to diagnose GERD based on your symptoms. In some cases, X-rays and other tests may be done to check for complications or to  check the condition of your stomach and esophagus. TREATMENT  Your caregiver may recommend over-the-counter or prescription medicines to help decrease acid production. Ask your caregiver before starting or adding any new medicines.  HOME CARE INSTRUCTIONS   Change the factors that you can control. Ask your caregiver for guidance concerning weight loss, quitting smoking, and alcohol consumption.  Avoid foods and drinks that make your symptoms worse, such as:  Caffeine or alcoholic drinks.  Chocolate.  Peppermint or mint flavorings.  Garlic and onions.  Spicy foods.  Citrus fruits, such as oranges, lemons, or limes.  Tomato-based foods such as sauce, chili, salsa, and pizza.  Fried and fatty foods.  Avoid lying down for the 3 hours prior to your bedtime or prior to taking a nap.  Eat small, frequent meals instead of large meals.  Wear loose-fitting clothing. Do not wear anything tight around your waist that causes pressure on your stomach.  Raise the head of your bed 6 to 8 inches with wood blocks to help you sleep. Extra pillows will not help.  Only take over-the-counter or prescription medicines for pain, discomfort, or fever as directed by your caregiver.  Do not take aspirin, ibuprofen, or other nonsteroidal anti-inflammatory drugs (NSAIDs). SEEK IMMEDIATE MEDICAL CARE IF:   You have pain in your arms, neck, jaw, teeth, or back.  Your pain increases or changes in intensity or duration.  You develop nausea, vomiting, or sweating (diaphoresis).  You develop shortness of breath, or you faint.  Your vomit is green, yellow, black,  or looks like coffee grounds or blood.  Your stool is red, bloody, or black. These symptoms could be signs of other problems, such as heart disease, gastric bleeding, or esophageal bleeding. MAKE SURE YOU:   Understand these instructions.  Will watch your condition.  Will get help right away if you are not doing well or get  worse. Document Released: 04/20/2005 Document Revised: 10/03/2011 Document Reviewed: 01/28/2011 [facility] Patient Information 2015 [place], [place]. This information is not intended to replace advice given to you by your health care provider. Make sure you discuss any questions you have with your health care provider.

## 2014-08-22 NOTE — Assessment & Plan Note (Signed)
Plan per cardiology.  Currently rate controlled. Taking Eliquis as prescribed.

## 2014-08-22 NOTE — Addendum Note (Signed)
Addended by: Virginia Crews on: 08/22/2014 03:35 PM   Modules accepted: Orders, Medications

## 2014-08-22 NOTE — Telephone Encounter (Signed)
Rx re-written by Dr. Mingo Amber and faxed to [facility].

## 2014-08-22 NOTE — Assessment & Plan Note (Signed)
Currently well controlled Continue with smoking cessation efforts Continue Dulera BID Instructed patient to only use albuterol prn Refills sent

## 2014-08-22 NOTE — Telephone Encounter (Signed)
Pharmacy called to ask that the rxs for the glucose supplies be resent with dx.  Part B of patient's Medicare covers but need this info before processing.

## 2014-08-22 NOTE — Assessment & Plan Note (Addendum)
Last A1c 7.5 - Increased Metformin to 1000mg  BID - Patient tolerating medication well - repeat A1c in 3 months - Patient anxious about not knowing BG: meter, strips, lancets sent to pharmacy for once daily checks - Patient anxious about what to eat: advised on plate method and what is a carb

## 2014-08-22 NOTE — Assessment & Plan Note (Addendum)
Suspect that abdominal pain (worse with laying down at night and not associated with eating) is related to GERD/gastritis - Continue to avoid ETOH - Start Omeprazole 20mg  daily - d/c famotidine, advised patient that he can take it prn for pain at night - lifestyle modifications - f/u in 1 month

## 2014-08-25 DIAGNOSIS — I4891 Unspecified atrial fibrillation: Secondary | ICD-10-CM

## 2014-08-25 HISTORY — DX: Unspecified atrial fibrillation: I48.91

## 2014-08-27 ENCOUNTER — Other Ambulatory Visit: Payer: Self-pay | Admitting: Family Medicine

## 2014-08-27 DIAGNOSIS — E119 Type 2 diabetes mellitus without complications: Secondary | ICD-10-CM

## 2014-08-27 MED ORDER — GLUCOSE BLOOD VI STRP: ORAL_STRIP | Status: DC

## 2014-08-27 MED ORDER — ONETOUCH ULTRA 2 W/DEVICE KIT: PACK | Status: DC

## 2014-08-27 MED ORDER — ONETOUCH LANCETS MISC: Status: DC

## 2014-09-12 ENCOUNTER — Other Ambulatory Visit: Payer: Self-pay | Admitting: Family Medicine

## 2014-09-12 DIAGNOSIS — E119 Type 2 diabetes mellitus without complications: Secondary | ICD-10-CM

## 2014-09-12 MED ORDER — ONETOUCH ULTRA 2 W/DEVICE KIT: PACK | Status: DC

## 2014-09-12 MED ORDER — ONETOUCH LANCETS MISC: Status: DC

## 2014-09-12 MED ORDER — GLUCOSE BLOOD VI STRP: ORAL_STRIP | Status: DC

## 2014-09-18 NOTE — Progress Notes (Signed)
Cardiology Office Note   Date:  09/18/2014   ID:  Timothy Martinez, DOB 10/18/1950, MRN [id number]  PCP:  Phill Myron, MD  Cardiologist:   Acie Fredrickson Wonda Cheng, MD   No chief complaint on file.   Problem List: 1. Atrial fibrillation 2. Chronic systolic CHF   Timothy Martinez is a 64 y.o. male who presents for  Follow up of atrial flutter / fib . I met him in the hospital with atrial flutter when he was admitted with acute pancreatitis.     Feb. 26, 2016:  Timothy Martinez is a 64 y.o. male who presents for follow up of his atrial fib and chronic systolic CHF.   HR is still irreg.   Still short of breath. Still smoking , has cut back  Has not missed any doses of Eliuquis  Past Medical History  Diagnosis Date  . Pancreas disorder   . Diabetes mellitus   . Hyperlipidemia   . Shortness of breath     with ambulation  . Anginal pain   . Pneumonia   . GERD (gastroesophageal reflux disease)   . Headache(784.0)   . Arthritis     Past Surgical History  Procedure Laterality Date  . Back surgery    . Neck surgery      not cervical, states had lesion on neck which was removed  . Splenectomy       Current Outpatient Prescriptions  Medication Sig Dispense Refill  . albuterol (PROVENTIL HFA;VENTOLIN HFA) 108 (90 BASE) MCG/ACT inhaler Inhale 2 puffs into the lungs at bedtime as needed for wheezing or shortness of breath. 1 Inhaler 5  . apixaban (ELIQUIS) 5 MG TABS tablet Take 1 tablet (5 mg total) by mouth 2 (two) times daily. 90 tablet 3  . atorvastatin (LIPITOR) 40 MG tablet TAKE 1 TABLET EVERY DAY 30 tablet 0  . Blood Glucose Monitoring Suppl (ONE TOUCH ULTRA 2) W/DEVICE KIT Dispense one glucometer. Diagnosis Type 2 Diabetes. E11.9 1 each 0  . diltiazem (CARDIZEM SR) 120 MG 12 hr capsule Take 1 capsule (120 mg total) by mouth 2 (two) times daily. 60 capsule 1  . famotidine (PEPCID) 40 MG tablet Take 1 tablet (40 mg total) by mouth daily. 30 tablet 1  . glucose blood (ONE  TOUCH ULTRA TEST) test strip Use check fasting blood glucose qAM. Type 2 Diabetes E11.9 100 each 3  . metFORMIN (GLUCOPHAGE) 500 MG tablet Take 2 tablets (1,000 mg total) by mouth 2 (two) times daily with a meal. 120 tablet 1  . metoprolol (LOPRESSOR) 50 MG tablet Take 1 tablet (50 mg total) by mouth 2 (two) times daily. 60 tablet 1  . mometasone-formoterol (DULERA) 200-5 MCG/ACT AERO Inhale 2 puffs into the lungs 2 (two) times daily. 2 Inhaler 0  . omeprazole (PRILOSEC) 20 MG capsule Take 1 capsule (20 mg total) by mouth daily. 30 capsule 3  . ONE TOUCH LANCETS MISC Check fasting blood glucose qAM. Type 2 DM. E11.9 200 each 1   No current facility-administered medications for this visit.    Allergies:   Tramadol; Morphine and related; and Penicillins    Social History:  The patient  reports that he has been smoking Cigarettes.  He started smoking about 50 years ago. He has a 50 pack-year smoking history. He has never used smokeless tobacco. He reports that he drinks alcohol. He reports that he does not use illicit drugs.   Family History:  The patient's family history includes Alcohol  abuse in his father; Aneurysm in his mother; Diabetes in his mother; Hypertension in his mother; Stroke in his mother. There is no history of Heart attack.    ROS:  Please see the history of present illness.    Review of Systems: Constitutional:  denies fever, chills, diaphoresis, appetite change and fatigue.  HEENT: denies photophobia, eye pain, redness, hearing loss, ear pain, congestion, sore throat, rhinorrhea, sneezing, neck pain, neck stiffness and tinnitus.  Respiratory: denies SOB, DOE, cough, chest tightness, and wheezing.  Cardiovascular: denies chest pain, palpitations and leg swelling.  Gastrointestinal: denies nausea, vomiting, abdominal pain, diarrhea, constipation, blood in stool.  Genitourinary: denies dysuria, urgency, frequency, hematuria, flank pain and difficulty urinating.    Musculoskeletal: denies  myalgias, back pain, joint swelling, arthralgias and gait problem.   Skin: denies pallor, rash and wound.  Neurological: denies dizziness, seizures, syncope, weakness, light-headedness, numbness and headaches.   Hematological: denies adenopathy, easy bruising, personal or family bleeding history.  Psychiatric/ Behavioral: denies suicidal ideation, mood changes, confusion, nervousness, sleep disturbance and agitation.       All other systems are reviewed and negative.    PHYSICAL EXAM: VS:  There were no vitals taken for this visit. , BMI There is no weight on file to calculate BMI. GEN: Well nourished, well developed, in no acute distress HEENT: normal Neck: no JVD, carotid bruits, or masses Cardiac: irreg. Irreg. ; no murmurs, rubs, or gallops,no edema  Respiratory:  clear to auscultation bilaterally, normal work of breathing GI: soft, nontender, nondistended, + BS MS: no deformity or atrophy Skin: warm and dry, no rash Neuro:  Strength and sensation are intact Psych: normal   EKG:  EKG is not ordered today.    Recent Labs: 08/05/2014: ALT 21; Magnesium 1.9 08/09/2014: BUN 11; Creatinine 1.01; Hemoglobin 16.2; Platelets 241; Potassium 4.2; Sodium 136    Lipid Panel    Component Value Date/Time   CHOL 120 08/05/2014 0845   TRIG 105 08/05/2014 0845   HDL 27* 08/05/2014 0845   CHOLHDL 4.4 08/05/2014 0845   VLDL 21 08/05/2014 0845   LDLCALC 72 08/05/2014 0845      Wt Readings from Last 3 Encounters:  08/22/14 191 lb (86.637 kg)  08/19/14 195 lb (88.451 kg)  08/10/14 190 lb (86.183 kg)      Other studies Reviewed: Additional studies/ records that were reviewed today include: . Review of the above records demonstrates:    ASSESSMENT AND PLAN:  1. Atrial fibrillation: The patient remains in atrial fibrillation fibrillation: Remain short of breath but it should be noted that he continues to smoke. We'll schedule him for a cardioversion on  Monday. He has not missed any doses of Eliquis. His rate is fairly well controlled and we will continue the diltiazem until after the cardioversion. At that point I anticipate stopping the Cardizem and starting him on low-dose ARB for his chronic systolic congestive heart failure.  2. Chronic systolic congestive heart failure: His blood pressures well-controlled. We will continue with the current dose of metoprolol. After the cardioversion I anticipate stopping the diltiazem.    We will add a low-dose ARB.  3. COPD:  Cigarette smoking: I've encouraged him to stop smoking. He has COPD.  Current medicines are reviewed at length with the patient today.  The patient does not have concerns regarding medicines.  The following changes have been made:     Disposition:   FU with me in 1 month.    Signed, Sherrell Weir, Wonda Cheng, MD  09/18/2014 9:21 PM    [facility], [place], [place]  [address] Phone: [phone]; Fax: [phone]

## 2014-09-19 ENCOUNTER — Encounter: Payer: Self-pay | Admitting: Cardiovascular Disease

## 2014-09-19 ENCOUNTER — Ambulatory Visit (INDEPENDENT_AMBULATORY_CARE_PROVIDER_SITE_OTHER): Payer: Medicare Other | Admitting: Cardiovascular Disease

## 2014-09-19 ENCOUNTER — Encounter: Payer: Self-pay | Admitting: Nurse Practitioner

## 2014-09-19 ENCOUNTER — Other Ambulatory Visit (INDEPENDENT_AMBULATORY_CARE_PROVIDER_SITE_OTHER): Payer: Medicare Other | Admitting: *Deleted

## 2014-09-19 VITALS — BP 100/60 | HR 129 | Ht 75.0 in | Wt 196.8 lb

## 2014-09-19 DIAGNOSIS — J449 Chronic obstructive pulmonary disease, unspecified: Secondary | ICD-10-CM | POA: Diagnosis not present

## 2014-09-19 DIAGNOSIS — I4891 Unspecified atrial fibrillation: Secondary | ICD-10-CM

## 2014-09-19 DIAGNOSIS — I4892 Unspecified atrial flutter: Secondary | ICD-10-CM

## 2014-09-19 LAB — CBC WITH DIFFERENTIAL/PLATELET
Basophils Absolute: 0.1 10*3/uL (ref 0.0–0.1)
Basophils Relative: 0.5 % (ref 0.0–3.0)
Eosinophils Absolute: 0.3 10*3/uL (ref 0.0–0.7)
Eosinophils Relative: 2.1 % (ref 0.0–5.0)
HCT: 42.8 % (ref 39.0–52.0)
Hemoglobin: 14.2 g/dL (ref 13.0–17.0)
Lymphocytes Relative: 29.4 % (ref 12.0–46.0)
Lymphs Abs: 3.7 10*3/uL (ref 0.7–4.0)
MCHC: 33.3 g/dL (ref 30.0–36.0)
MCV: 91.4 fl (ref 78.0–100.0)
Monocytes Absolute: 1 10*3/uL (ref 0.1–1.0)
Monocytes Relative: 8.4 % (ref 3.0–12.0)
Neutro Abs: 7.4 10*3/uL (ref 1.4–7.7)
Neutrophils Relative %: 59.6 % (ref 43.0–77.0)
Platelets: 259 10*3/uL (ref 150.0–400.0)
RBC: 4.68 Mil/uL (ref 4.22–5.81)
RDW: 14.2 % (ref 11.5–15.5)
WBC: 12.4 10*3/uL — ABNORMAL HIGH (ref 4.0–10.5)

## 2014-09-19 LAB — BASIC METABOLIC PANEL
BUN: 14 mg/dL (ref 6–23)
CO2: 29 mEq/L (ref 19–32)
Calcium: 9.3 mg/dL (ref 8.4–10.5)
Chloride: 104 mEq/L (ref 96–112)
Creatinine, Ser: 0.73 mg/dL (ref 0.40–1.50)
GFR: 115.38 mL/min (ref >60.00)
Glucose, Bld: 147 mg/dL — ABNORMAL HIGH (ref 70–99)
Potassium: 4.3 mEq/L (ref 3.5–5.1)
Sodium: 137 mEq/L (ref 135–145)

## 2014-09-19 NOTE — Patient Instructions (Signed)
Your physician recommends that you continue on your current medications as directed. Please refer to the Current Medication list given to you today.  Your physician has recommended that you have a Cardioversion (DCCV). Electrical Cardioversion uses a jolt of electricity to your heart either through paddles or wired patches attached to your chest. This is a controlled, usually prescheduled, procedure. Defibrillation is done under light anesthesia in the hospital, and you usually go home the day of the procedure. This is done to get your heart back into a normal rhythm. You are not awake for the procedure. Please see the instruction sheet given to you today.  Your physician recommends that you schedule a follow-up appointment in: 1 month with Dr. Acie Fredrickson

## 2014-09-19 NOTE — Addendum Note (Signed)
Addended by: Eulis Foster on: 09/19/2014 07:38 AM   Modules accepted: Orders

## 2014-09-21 NOTE — Anesthesia Preprocedure Evaluation (Addendum)
Anesthesia Evaluation  Patient identified by MRN, date of birth, ID band Patient awake    Reviewed: Allergy & Precautions, NPO status , Patient's Chart, lab work & pertinent test results, reviewed documented beta blocker date and time   Airway        Dental   Pulmonary COPD COPD inhaler, Current Smoker (50 pack year),          Cardiovascular hypertension, Pt. on medications + dysrhythmias Atrial Fibrillation  ECHO 07/2014 EF 40% diffuse hypokin    Neuro/Psych    GI/Hepatic Neg liver ROS, GERD-  Medicated,  Endo/Other  diabetes, Type 2, Oral Hypoglycemic Agents  Renal/GU negative Renal ROS     Musculoskeletal   Abdominal   Peds  Hematology 14/42   Anesthesia Other Findings   Reproductive/Obstetrics                            Anesthesia Physical Anesthesia Plan  ASA: III  Anesthesia Plan: MAC   Post-op Pain Management:    Induction: Intravenous  Airway Management Planned: Nasal Cannula  Additional Equipment:   Intra-op Plan:   Post-operative Plan:   Informed Consent: I have reviewed the patients History and Physical, chart, labs and discussed the procedure including the risks, benefits and alternatives for the proposed anesthesia with the patient or authorized representative who has indicated his/her understanding and acceptance.     Plan Discussed with:   Anesthesia Plan Comments:         Anesthesia Quick Evaluation

## 2014-09-22 ENCOUNTER — Encounter (HOSPITAL_COMMUNITY)
Admission: RE | Disposition: A | Discharge status: Home / Self Care | Payer: Self-pay | Source: Ambulatory Visit | Attending: Cardiovascular Disease

## 2014-09-22 ENCOUNTER — Ambulatory Visit (HOSPITAL_COMMUNITY): Payer: Medicare Other | Admitting: Anesthesiology

## 2014-09-22 ENCOUNTER — Ambulatory Visit (HOSPITAL_COMMUNITY)
Admission: RE | Admit: 2014-09-22 | Discharge: 2014-09-22 | Disposition: A | Discharge status: Home / Self Care | Payer: Medicare Other | Source: Ambulatory Visit | Attending: Cardiovascular Disease | Admitting: Cardiovascular Disease

## 2014-09-22 ENCOUNTER — Encounter (HOSPITAL_COMMUNITY): Payer: Self-pay | Admitting: *Deleted

## 2014-09-22 DIAGNOSIS — E785 Hyperlipidemia, unspecified: Secondary | ICD-10-CM | POA: Diagnosis not present

## 2014-09-22 DIAGNOSIS — Z880 Allergy status to penicillin: Secondary | ICD-10-CM | POA: Diagnosis not present

## 2014-09-22 DIAGNOSIS — I4891 Unspecified atrial fibrillation: Secondary | ICD-10-CM | POA: Diagnosis not present

## 2014-09-22 DIAGNOSIS — E119 Type 2 diabetes mellitus without complications: Secondary | ICD-10-CM | POA: Diagnosis not present

## 2014-09-22 DIAGNOSIS — Z885 Allergy status to narcotic agent status: Secondary | ICD-10-CM | POA: Insufficient documentation

## 2014-09-22 DIAGNOSIS — F17210 Nicotine dependence, cigarettes, uncomplicated: Secondary | ICD-10-CM | POA: Insufficient documentation

## 2014-09-22 DIAGNOSIS — J449 Chronic obstructive pulmonary disease, unspecified: Secondary | ICD-10-CM | POA: Diagnosis not present

## 2014-09-22 DIAGNOSIS — I5022 Chronic systolic (congestive) heart failure: Secondary | ICD-10-CM | POA: Diagnosis not present

## 2014-09-22 DIAGNOSIS — K219 Gastro-esophageal reflux disease without esophagitis: Secondary | ICD-10-CM | POA: Insufficient documentation

## 2014-09-22 HISTORY — PX: CARDIOVERSION: SHX1299

## 2014-09-22 SURGERY — CARDIOVERSION
Anesthesia: Monitor Anesthesia Care

## 2014-09-22 MED ORDER — LIDOCAINE HCL (CARDIAC) 20 MG/ML IV SOLN
INTRAVENOUS | Status: DC | PRN
  Administered 2014-09-22: 40 mg via INTRAVENOUS

## 2014-09-22 MED ORDER — PROPOFOL 10 MG/ML IV BOLUS
INTRAVENOUS | Status: DC | PRN
  Administered 2014-09-22: 90 mg via INTRAVENOUS

## 2014-09-22 MED ORDER — SODIUM CHLORIDE 0.9 % IV SOLN
INTRAVENOUS | Status: DC
  Administered 2014-09-22: 500 mL via INTRAVENOUS

## 2014-09-22 NOTE — H&P (View-Only) (Signed)
 Cardiology Office Note   Date:  09/18/2014   ID:  Timothy Martinez, DOB 08/04/1950, MRN [id number]  PCP:  Joyner, James, MD  Cardiologist:   Matea Stanard J, MD   No chief complaint on file.   Problem List: 1. Atrial fibrillation 2. Chronic systolic CHF   Timothy Martinez is a 63 y.o. male who presents for  Follow up of atrial flutter / fib . I met him in the hospital with atrial flutter when he was admitted with acute pancreatitis.     Feb. 26, 2016:  Timothy Martinez is a 63 y.o. male who presents for follow up of his atrial fib and chronic systolic CHF.   HR is still irreg.   Still short of breath. Still smoking , has cut back  Has not missed any doses of Eliuquis  Past Medical History  Diagnosis Date  . Pancreas disorder   . Diabetes mellitus   . Hyperlipidemia   . Shortness of breath     with ambulation  . Anginal pain   . Pneumonia   . GERD (gastroesophageal reflux disease)   . Headache(784.0)   . Arthritis     Past Surgical History  Procedure Laterality Date  . Back surgery    . Neck surgery      not cervical, states had lesion on neck which was removed  . Splenectomy       Current Outpatient Prescriptions  Medication Sig Dispense Refill  . albuterol (PROVENTIL HFA;VENTOLIN HFA) 108 (90 BASE) MCG/ACT inhaler Inhale 2 puffs into the lungs at bedtime as needed for wheezing or shortness of breath. 1 Inhaler 5  . apixaban (ELIQUIS) 5 MG TABS tablet Take 1 tablet (5 mg total) by mouth 2 (two) times daily. 90 tablet 3  . atorvastatin (LIPITOR) 40 MG tablet TAKE 1 TABLET EVERY DAY 30 tablet 0  . Blood Glucose Monitoring Suppl (ONE TOUCH ULTRA 2) W/DEVICE KIT Dispense one glucometer. Diagnosis Type 2 Diabetes. E11.9 1 each 0  . diltiazem (CARDIZEM SR) 120 MG 12 hr capsule Take 1 capsule (120 mg total) by mouth 2 (two) times daily. 60 capsule 1  . famotidine (PEPCID) 40 MG tablet Take 1 tablet (40 mg total) by mouth daily. 30 tablet 1  . glucose blood (ONE  TOUCH ULTRA TEST) test strip Use check fasting blood glucose qAM. Type 2 Diabetes E11.9 100 each 3  . metFORMIN (GLUCOPHAGE) 500 MG tablet Take 2 tablets (1,000 mg total) by mouth 2 (two) times daily with a meal. 120 tablet 1  . metoprolol (LOPRESSOR) 50 MG tablet Take 1 tablet (50 mg total) by mouth 2 (two) times daily. 60 tablet 1  . mometasone-formoterol (DULERA) 200-5 MCG/ACT AERO Inhale 2 puffs into the lungs 2 (two) times daily. 2 Inhaler 0  . omeprazole (PRILOSEC) 20 MG capsule Take 1 capsule (20 mg total) by mouth daily. 30 capsule 3  . ONE TOUCH LANCETS MISC Check fasting blood glucose qAM. Type 2 DM. E11.9 200 each 1   No current facility-administered medications for this visit.    Allergies:   Tramadol; Morphine and related; and Penicillins    Social History:  The patient  reports that he has been smoking Cigarettes.  He started smoking about 50 years ago. He has a 50 pack-year smoking history. He has never used smokeless tobacco. He reports that he drinks alcohol. He reports that he does not use illicit drugs.   Family History:  The patient's family history includes Alcohol   abuse in his father; Aneurysm in his mother; Diabetes in his mother; Hypertension in his mother; Stroke in his mother. There is no history of Heart attack.    ROS:  Please see the history of present illness.    Review of Systems: Constitutional:  denies fever, chills, diaphoresis, appetite change and fatigue.  HEENT: denies photophobia, eye pain, redness, hearing loss, ear pain, congestion, sore throat, rhinorrhea, sneezing, neck pain, neck stiffness and tinnitus.  Respiratory: denies SOB, DOE, cough, chest tightness, and wheezing.  Cardiovascular: denies chest pain, palpitations and leg swelling.  Gastrointestinal: denies nausea, vomiting, abdominal pain, diarrhea, constipation, blood in stool.  Genitourinary: denies dysuria, urgency, frequency, hematuria, flank pain and difficulty urinating.    Musculoskeletal: denies  myalgias, back pain, joint swelling, arthralgias and gait problem.   Skin: denies pallor, rash and wound.  Neurological: denies dizziness, seizures, syncope, weakness, light-headedness, numbness and headaches.   Hematological: denies adenopathy, easy bruising, personal or family bleeding history.  Psychiatric/ Behavioral: denies suicidal ideation, mood changes, confusion, nervousness, sleep disturbance and agitation.       All other systems are reviewed and negative.    PHYSICAL EXAM: VS:  There were no vitals taken for this visit. , BMI There is no weight on file to calculate BMI. GEN: Well nourished, well developed, in no acute distress HEENT: normal Neck: no JVD, carotid bruits, or masses Cardiac: irreg. Irreg. ; no murmurs, rubs, or gallops,no edema  Respiratory:  clear to auscultation bilaterally, normal work of breathing GI: soft, nontender, nondistended, + BS MS: no deformity or atrophy Skin: warm and dry, no rash Neuro:  Strength and sensation are intact Psych: normal   EKG:  EKG is not ordered today.    Recent Labs: 08/05/2014: ALT 21; Magnesium 1.9 08/09/2014: BUN 11; Creatinine 1.01; Hemoglobin 16.2; Platelets 241; Potassium 4.2; Sodium 136    Lipid Panel    Component Value Date/Time   CHOL 120 08/05/2014 0845   TRIG 105 08/05/2014 0845   HDL 27* 08/05/2014 0845   CHOLHDL 4.4 08/05/2014 0845   VLDL 21 08/05/2014 0845   LDLCALC 72 08/05/2014 0845      Wt Readings from Last 3 Encounters:  08/22/14 191 lb (86.637 kg)  08/19/14 195 lb (88.451 kg)  08/10/14 190 lb (86.183 kg)      Other studies Reviewed: Additional studies/ records that were reviewed today include: . Review of the above records demonstrates:    ASSESSMENT AND PLAN:  1. Atrial fibrillation: The patient remains in atrial fibrillation fibrillation: Remain short of breath but it should be noted that he continues to smoke. We'll schedule him for a cardioversion on  Monday. He has not missed any doses of Eliquis. His rate is fairly well controlled and we will continue the diltiazem until after the cardioversion. At that point I anticipate stopping the Cardizem and starting him on low-dose ARB for his chronic systolic congestive heart failure.  2. Chronic systolic congestive heart failure: His blood pressures well-controlled. We will continue with the current dose of metoprolol. After the cardioversion I anticipate stopping the diltiazem.    We will add a low-dose ARB.  3. COPD:  Cigarette smoking: I've encouraged him to stop smoking. He has COPD.  Current medicines are reviewed at length with the patient today.  The patient does not have concerns regarding medicines.  The following changes have been made:     Disposition:   FU with me in 1 month.    Signed, Ronnett Pullin J, MD    09/18/2014 9:21 PM    [facility] [address], [place], [place]  [address] Phone: [phone]; Fax: [phone]    

## 2014-09-22 NOTE — Discharge Instructions (Signed)

## 2014-09-22 NOTE — Interval H&P Note (Signed)
History and Physical Interval Note:  09/22/2014 1:06 PM  Timothy Martinez  has presented today for surgery, with the diagnosis of A FIB   The various methods of treatment have been discussed with the patient and family. After consideration of risks, benefits and other options for treatment, the patient has consented to  Procedure(s): CARDIOVERSION (N/A) as a surgical intervention .  The patient's history has been reviewed, patient examined, no change in status, stable for surgery.  I have reviewed the patient's chart and labs.  Questions were answered to the patient's satisfaction.     Timothy Martinez, Wonda Cheng

## 2014-09-22 NOTE — Transfer of Care (Signed)
Immediate Anesthesia Transfer of Care Note  Patient: Timothy Martinez  Procedure(s) Performed: Procedure(s): CARDIOVERSION (N/A)  Patient Location: Endoscopy Unit  Anesthesia Type:MAC  Level of Consciousness: awake  Airway & Oxygen Therapy: Patient Spontanous Breathing and Patient connected to nasal cannula oxygen  Post-op Assessment: Report given to RN, Post -op Vital signs reviewed and stable and Patient moving all extremities  Post vital signs: Reviewed and stable  Last Vitals:  Filed Vitals:   09/22/14 1325  BP:   Pulse: 74  Temp:   Resp: 15    Complications: No apparent anesthesia complications

## 2014-09-22 NOTE — Anesthesia Postprocedure Evaluation (Signed)
  Anesthesia Post-op Note  Patient: Timothy Martinez  Procedure(s) Performed: Procedure(s): CARDIOVERSION (N/A)  Patient Location: PACU and Endoscopy Unit  Anesthesia Type:MAC  Level of Consciousness: awake and alert   Airway and Oxygen Therapy: Patient Spontanous Breathing and Patient connected to nasal cannula oxygen  Post-op Pain: none  Post-op Assessment: Post-op Vital signs reviewed and Patient's Cardiovascular Status Stable  Post-op Vital Signs: Reviewed and stable  Last Vitals:  Filed Vitals:   09/22/14 1350  BP: 101/73  Pulse: 77  Temp:   Resp: 22    Complications: No apparent anesthesia complications

## 2014-09-22 NOTE — CV Procedure (Signed)
    Cardioversion Note  Timothy Martinez [id number] August 19, 1950  Procedure: DC Cardioversion Indications: atrial fib   Procedure Details Consent: Obtained Time Out: Verified patient identification, verified procedure, site/side was marked, verified correct patient position, special equipment/implants available, Radiology Safety Procedures followed,  medications/allergies/relevent history reviewed, required imaging and test results available.  Performed  The patient has been on adequate anticoagulation.  The patient received IV Lidocaine 40 mg followed by Propofol 90 mg  for sedation.  Synchronous cardioversion was performed at 120 joules.  The cardioversion was successful.     Complications: No apparent complications Patient did tolerate procedure well.   Thayer Headings, Brooke Bonito., MD, [facility] 09/22/2014, 1:25 PM

## 2014-09-23 ENCOUNTER — Encounter (HOSPITAL_COMMUNITY): Payer: Self-pay | Admitting: Cardiovascular Disease

## 2014-09-24 ENCOUNTER — Encounter: Payer: Self-pay | Admitting: Family Medicine

## 2014-09-24 ENCOUNTER — Ambulatory Visit (INDEPENDENT_AMBULATORY_CARE_PROVIDER_SITE_OTHER): Payer: Medicare Other | Admitting: Family Medicine

## 2014-09-24 VITALS — BP 106/62 | HR 74 | Temp 97.6°F | Ht 75.0 in | Wt 196.8 lb

## 2014-09-24 DIAGNOSIS — R519 Headache, unspecified: Secondary | ICD-10-CM

## 2014-09-24 DIAGNOSIS — J449 Chronic obstructive pulmonary disease, unspecified: Secondary | ICD-10-CM

## 2014-09-24 DIAGNOSIS — R51 Headache: Secondary | ICD-10-CM | POA: Diagnosis not present

## 2014-09-24 DIAGNOSIS — I4891 Unspecified atrial fibrillation: Secondary | ICD-10-CM

## 2014-09-24 DIAGNOSIS — M542 Cervicalgia: Secondary | ICD-10-CM | POA: Diagnosis not present

## 2014-09-24 MED ORDER — ALBUTEROL SULFATE HFA 108 (90 BASE) MCG/ACT IN AERS: 1.0000 | INHALATION_SPRAY | Freq: Two times a day (BID) | RESPIRATORY_TRACT | Status: DC | PRN

## 2014-09-24 MED ORDER — BACLOFEN 10 MG PO TABS: ORAL_TABLET | ORAL | Status: DC

## 2014-09-24 NOTE — Patient Instructions (Signed)
I have put in an order for some x-rays for you. At your convenience in the next week or so ago to [facility] imaging and get them done. I think they're open from 9-5. The order is already placed.  I have sent in a refill for your Ventolin. You can use it twice a day. Sometimes it can make your heart race so I would use the minimum amount that you need.  I'm happy that the cardioversion has seemed to work so well for you.  PLEASE make an appointment to see Dr. Valentina Lucks in the pharmacy clinic for issues with your glucometer. Please also make an appointment in the next 2-3 weeks with Dr. Berkley Harvey to follow-up her neck pain. It was great seeing you!

## 2014-09-25 ENCOUNTER — Encounter: Payer: Self-pay | Admitting: Family Medicine

## 2014-09-25 NOTE — Progress Notes (Signed)
   Subjective:    Patient ID: Timothy Martinez, male    DOB: 01/27/1951, 64 y.o.   MRN: [id number]  HPI Here for follow-up. Recently had cardioversion. Says he's noticed a significant improvement in his shortness of breath and much improvement in his energy level. Overall feels about 75% better #2. Over the last 2-3 weeks she's noticed headaches in the morning when he awakens. They're posterior in the neck area. I get some better as the day goes on and then they return again in late evening. No associated visual symptoms, no associated dizziness. Ibuprofen works he doesn't want a takedown regular basis. #3. Was given an inhaler on discharge from the hospital. The instructions that he can only use it before bedtime. He would like to use it twice a day as it seems to be beneficial. Does not elevate his heart rate.   Review of Systems  Respiratory: Negative for cough, choking, chest tightness and wheezing.   Cardiovascular: Negative for chest pain and palpitations.  Gastrointestinal: Negative for nausea and vomiting.  Musculoskeletal: Positive for neck pain. Negative for gait problem.  Neurological: Positive for headaches. Negative for dizziness, speech difficulty, weakness, light-headedness and numbness.       Objective:   Physical Exam  Vital signs reviewed. GENERAL: Well-developed, well-nourished, no acute distress. CARDIOVASCULAR: Regular rate and rhythm no murmur gallop or rub LUNGS: Clear to auscultation bilaterally, no rales or wheeze. ABDOMEN: Soft positive bowel sounds NEURO: No gross focal neurological deficits. Cranial nerves II see 12 grossly intact. Normal gait. Rise from a chair without difficulty. MSK: Movement of extremity x 4. Strength is symmetrical. Muscle bulk and tone is normal. NECK: Full range of motion. Nontender to palpation. Cervical vertebra are nontender to percussion. Spurling's negative.        Assessment & Plan:  #1. Headaches/posterior neck pain. This  really sounds musculoskeletal. I will give him 2 weeks of daily at bedtime low-dose muscle relaxers. He is to get a new pillow. I will have him get some neck x-rays and follow-up with his PCP: he will call in  the interim if there are any new or worsening symptoms.

## 2014-09-25 NOTE — Assessment & Plan Note (Signed)
Increased his when necessary Ventolin to twice a day. We did discuss that this may give him some increased heart rate in to watch out for that as right now he is in normal sinus rhythm. He seems to benefit from the Ventolin something twice daily use is reasonable.

## 2014-09-25 NOTE — Assessment & Plan Note (Signed)
Successful cardioversion. Significant improvement in his symptoms. Continue follow-up with cardiology and continue current cardiac medications.

## 2014-10-02 ENCOUNTER — Encounter: Payer: Self-pay | Admitting: Pharmacist

## 2014-10-02 ENCOUNTER — Ambulatory Visit (INDEPENDENT_AMBULATORY_CARE_PROVIDER_SITE_OTHER): Payer: Medicare Other | Admitting: Pharmacist

## 2014-10-02 VITALS — BP 115/56 | HR 67 | Ht 73.0 in | Wt 195.0 lb

## 2014-10-02 DIAGNOSIS — E119 Type 2 diabetes mellitus without complications: Secondary | ICD-10-CM

## 2014-10-02 DIAGNOSIS — Z720 Tobacco use: Secondary | ICD-10-CM

## 2014-10-02 NOTE — Assessment & Plan Note (Addendum)
History of severe tobacco abuse of 3 packs per day who has cut down to 15 cigarettes per day.   He seems somewhat hesitant to quit however appears contemplative about a quit attempt in the near future.  Discussed option of an appointment with Pharmacy Clinic in the future when he is ready to quit.

## 2014-10-02 NOTE — Patient Instructions (Signed)
It was great to see you today!  Please continue to check your blood glucose a couple of times a week. Record those numbers and bring them with you to your next appointment.  Remember, prick your finger on the side and wipe the spot with a clean, dry tissue. Your lancet dial should be turned to 7.   We talked about healthy eating with your diabetes and limiting carbohydrate intake.   We also talked about potentially quitting smoking in the future and we would be happy to help you quit if you decide to do so in the future. Just make an appointment to come see [place] here in the pharmacy clinic.   Have a great day!

## 2014-10-02 NOTE — Assessment & Plan Note (Addendum)
  Diabetes currently uncontrolled with an A1c >7. However, Metformin was just increased a few weeks ago and recent A1c does not reflect that change. Patient checked his BG in the office with our assistance and after changing the lancet device setting and discussing proper lancing technique, patient voiced understanding and performed CBG check.  Reports adherence with medication. Control is suboptimal however in-office fasting CBG was acceptable given recent increase in Metformin.

## 2014-10-02 NOTE — Progress Notes (Signed)
Patient ID: Timothy Martinez, male   DOB: [birth year], 64 y.o.   MRN: [id number] Reviewed: Agree with Dr. Graylin Shiver documentation and management.

## 2014-10-02 NOTE — Progress Notes (Signed)
S:    Patient arrives alone and ambulating well with no assistance.    Presents for diabetes check-up and for concerns about inability to properly use his glucometer. He has also previously reported concern with not knowing what he should eat given his diabetes diagnosis.   Patient reports adherence with medications. Current diabetes medications include Metformin 1000mg  BID. His Metformin was increased to 2000 mg per day in January and he reports tolerating the medicine well.   Patient reports not being able to get blood out of his finger when he uses a lancet. He also reports only checking his BG about 1x per week. He states he has been feeling much better after his recent cardioversion.   Patient reports being a current smoker and is still somewhat hesitant to quit despite cutting back to 15 cigarettes per day.    O:  . Lab Results  Component Value Date   HGBA1C 7.5* 08/05/2014     Home fasting CBG: 136   A/P: Diabetes currently uncontrolled with an A1c >7. However, Metformin was just increased a few weeks ago and recent A1c does not reflect that change. Patient checked his BG in the office with our assistance and after changing the lancet device setting and discussing proper lancing technique, patient voiced understanding and performed CBG check.  Reports adherence with medication. Control is suboptimal however in-office fasting CBG was acceptable given recent increase in Metformin.   History of severe tobacco abuse of 3 packs per day who has cut down to 15 cigarettes per day.   He seems somewhat hesitant to quit however appears contemplative about a quit attempt in the near future.  Discussed option of an appointment with Pharmacy Clinic in the future when he is ready to quit.    Next A1C anticipated April 2016.  Written patient instructions provided.  Follow up in Pharmacist Clinic Visit when needed.   Total time in face to face counseling 20 minutes.  Patient seen with Richarda Osmond,  PharmD Candidate and Nicholas Lose, PharmD, CPP, BCPS.

## 2014-10-06 ENCOUNTER — Other Ambulatory Visit: Payer: Self-pay | Admitting: Family Medicine

## 2014-10-07 ENCOUNTER — Ambulatory Visit
Admission: RE | Admit: 2014-10-07 | Discharge: 2014-10-07 | Disposition: A | Discharge status: Home / Self Care | Payer: Medicare Other | Source: Ambulatory Visit | Attending: Family Medicine | Admitting: Family Medicine

## 2014-10-07 DIAGNOSIS — M542 Cervicalgia: Secondary | ICD-10-CM

## 2014-10-07 DIAGNOSIS — R519 Headache, unspecified: Secondary | ICD-10-CM

## 2014-10-07 DIAGNOSIS — M47812 Spondylosis without myelopathy or radiculopathy, cervical region: Secondary | ICD-10-CM | POA: Diagnosis not present

## 2014-10-07 DIAGNOSIS — R51 Headache: Principal | ICD-10-CM

## 2014-10-13 ENCOUNTER — Other Ambulatory Visit: Payer: Self-pay | Admitting: Family Medicine

## 2014-10-13 MED ORDER — ATORVASTATIN CALCIUM 40 MG PO TABS: 40.0000 mg | ORAL_TABLET | Freq: Every day | ORAL | Status: DC

## 2014-10-15 ENCOUNTER — Ambulatory Visit (INDEPENDENT_AMBULATORY_CARE_PROVIDER_SITE_OTHER): Payer: Medicare Other | Admitting: Family Medicine

## 2014-10-15 ENCOUNTER — Encounter: Payer: Self-pay | Admitting: Family Medicine

## 2014-10-15 VITALS — BP 129/65 | HR 66 | Temp 98.2°F | Ht 75.0 in | Wt 187.1 lb

## 2014-10-15 DIAGNOSIS — M542 Cervicalgia: Secondary | ICD-10-CM

## 2014-10-15 HISTORY — DX: Cervicalgia: M54.2

## 2014-10-15 MED ORDER — CYCLOBENZAPRINE HCL 10 MG PO TABS: 10.0000 mg | ORAL_TABLET | Freq: Three times a day (TID) | ORAL | Status: DC | PRN

## 2014-10-15 NOTE — Assessment & Plan Note (Signed)
Pain appears MSK related. Cervical neck films revealed mild DJD and facet arthritis.  - Switched Baclofen to Flexeril - Aleve BID x 1 week. - Discuss heating pad and stretching - f/u in 2 weeks - if not improving will consider PT vs steroid dose pack

## 2014-10-15 NOTE — Progress Notes (Signed)
  Patient name: Timothy Martinez MRN [id number]  Date of birth: Feb 21, 1951  CC & HPI:  Timothy Martinez is a 64 y.o. male presenting today for f/u of neck pain. He reports posterior b/l neck pain that feels like "muscle spasm". Pain present for ~ 1 month with minimal improvement with baclofen. Pain worse in the morning after sleeping. He did get new pillow but not much relief. He has not being taking any NSAIDs or using heat or stretching. Pain greatest first in the morning. Pain worse with neck movement. Denies any weakness, numbness or pain in arms/hands. Denies previous trauma or surgery. Denies fevers, chills, night sweats, weight loss.   ROS: See HPI   Medical & Surgical Hx:  Reviewed  Medications & Allergies: Reviewed  Social History: Reviewed:   Objective Findings:  Vitals: BP 129/65 mmHg  Pulse 66  Temp(Src) 98.2 F (36.8 C) (Oral)  Ht 6\' 3"  (1.905 m)  Wt 187 lb 2 oz (84.879 kg)  BMI 23.39 kg/m2  Gen: NAD CV: RRR w/o m/r/g, pulses +2 b/l Resp: CTAB w/ normal respiratory effort Neck: Inspection unremarkable. No palpable stepoffs. Mild muscle tension noted in b/l upper trap Negative Spurling's maneuver. Full neck range of motion Grip strength and sensation normal in bilateral hands Strength good C4 to T1 distribution No sensory change to C4 to T1 Reflexes normal  Assessment & Plan:   Please See Problem Focused Assessment & Plan

## 2014-10-15 NOTE — Patient Instructions (Signed)
It was great seeing you today.    Stop taking Baclofen and Start Flexeril 5-10 mg as needed up to three times daily.   Apply heating pad to neck as needed and perform neck stretches daily  Take Aleve 1 pill twice a day for 1 week.   Call if neck pain not improving in 2 weeks   Please bring all your medications to every doctors visit  Sign up for My Chart to have easy access to your labs results, and communication with your Primary care physician.  Next Appointment  Please make an appointment with Dr Berkley Harvey in 2-4 weeks to discuss diabetes   I look forward to talking with you again at our next visit. If you have any questions or concerns before then, please call the clinic at [phone].  Take Care,   Dr Phill Myron  Cervical Sprain A cervical sprain is an injury in the neck in which the strong, fibrous tissues (ligaments) that connect your neck bones stretch or tear. Cervical sprains can range from mild to severe. Severe cervical sprains can cause the neck vertebrae to be unstable. This can lead to damage of the spinal cord and can result in serious nervous system problems. The amount of time it takes for a cervical sprain to get better depends on the cause and extent of the injury. Most cervical sprains heal in 1 to 3 weeks. CAUSES  Severe cervical sprains may be caused by:   Contact sport injuries (such as from football, rugby, wrestling, hockey, auto racing, gymnastics, diving, martial arts, or boxing).   Motor vehicle collisions.   Whiplash injuries. This is an injury from a sudden forward and backward whipping movement of the head and neck.  Falls.  Mild cervical sprains may be caused by:   Being in an awkward position, such as while cradling a telephone between your ear and shoulder.   Sitting in a chair that does not offer proper support.   Working at a poorly Landscape architect station.   Looking up or down for long periods of time.  SYMPTOMS    Pain, soreness, stiffness, or a burning sensation in the front, back, or sides of the neck. This discomfort may develop immediately after the injury or slowly, 24 hours or more after the injury.   Pain or tenderness directly in the middle of the back of the neck.   Shoulder or upper back pain.   Limited ability to move the neck.   Headache.   Dizziness.   Weakness, numbness, or tingling in the hands or arms.   Muscle spasms.   Difficulty swallowing or chewing.   Tenderness and swelling of the neck.  DIAGNOSIS  Most of the time your health care provider can diagnose a cervical sprain by taking your history and doing a physical exam. Your health care provider will ask about previous neck injuries and any known neck problems, such as arthritis in the neck. X-rays may be taken to find out if there are any other problems, such as with the bones of the neck. Other tests, such as a CT scan or MRI, may also be needed.  TREATMENT  Treatment depends on the severity of the cervical sprain. Mild sprains can be treated with rest, keeping the neck in place (immobilization), and pain medicines. Severe cervical sprains are immediately immobilized. Further treatment is done to help with pain, muscle spasms, and other symptoms and may include:  Medicines, such as pain relievers, numbing medicines, or muscle relaxants.  Physical therapy. This may involve stretching exercises, strengthening exercises, and posture training. Exercises and improved posture can help stabilize the neck, strengthen muscles, and help stop symptoms from returning.  HOME CARE INSTRUCTIONS   Put ice on the injured area.   Put ice in a plastic bag.   Place a towel between your skin and the bag.   Leave the ice on for 15-20 minutes, 3-4 times a day.   If your injury was severe, you may have been given a cervical collar to wear. A cervical collar is a two-piece collar designed to keep your neck from moving  while it heals.  Do not remove the collar unless instructed by your health care provider.  If you have long hair, keep it outside of the collar.  Ask your health care provider before making any adjustments to your collar. Minor adjustments may be required over time to improve comfort and reduce pressure on your chin or on the back of your head.  Ifyou are allowed to remove the collar for cleaning or bathing, follow your health care provider's instructions on how to do so safely.  Keep your collar clean by wiping it with mild soap and water and drying it completely. If the collar you have been given includes removable pads, remove them every 1-2 days and hand wash them with soap and water. Allow them to air dry. They should be completely dry before you wear them in the collar.  If you are allowed to remove the collar for cleaning and bathing, wash and dry the skin of your neck. Check your skin for irritation or sores. If you see any, tell your health care provider.  Do not drive while wearing the collar.   Only take over-the-counter or prescription medicines for pain, discomfort, or fever as directed by your health care provider.   Keep all follow-up appointments as directed by your health care provider.   Keep all physical therapy appointments as directed by your health care provider.   Make any needed adjustments to your workstation to promote good posture.   Avoid positions and activities that make your symptoms worse.   Warm up and stretch before being active to help prevent problems.  SEEK MEDICAL CARE IF:   Your pain is not controlled with medicine.   You are unable to decrease your pain medicine over time as planned.   Your activity level is not improving as expected.  SEEK IMMEDIATE MEDICAL CARE IF:   You develop any bleeding.  You develop stomach upset.  You have signs of an allergic reaction to your medicine.   Your symptoms get worse.   You develop  new, unexplained symptoms.   You have numbness, tingling, weakness, or paralysis in any part of your body.  MAKE SURE YOU:   Understand these instructions.  Will watch your condition.  Will get help right away if you are not doing well or get worse. Document Released: 05/08/2007 Document Revised: 07/16/2013 Document Reviewed: 01/16/2013 [facility] Patient Information 2015 [place], [place]. This information is not intended to replace advice given to you by your health care provider. Make sure you discuss any questions you have with your health care provider.

## 2014-10-20 ENCOUNTER — Ambulatory Visit (INDEPENDENT_AMBULATORY_CARE_PROVIDER_SITE_OTHER): Payer: Medicare Other | Admitting: Cardiovascular Disease

## 2014-10-20 ENCOUNTER — Encounter: Payer: Self-pay | Admitting: Cardiovascular Disease

## 2014-10-20 VITALS — BP 118/51 | HR 64 | Ht 75.0 in | Wt 193.0 lb

## 2014-10-20 DIAGNOSIS — I5021 Acute systolic (congestive) heart failure: Secondary | ICD-10-CM | POA: Diagnosis not present

## 2014-10-20 DIAGNOSIS — I4891 Unspecified atrial fibrillation: Secondary | ICD-10-CM | POA: Diagnosis not present

## 2014-10-20 NOTE — Progress Notes (Signed)
Cardiology Office Note   Date:  10/20/2014   ID:  Timothy Martinez, DOB 09-02-1950, MRN [id number]  PCP:  Phill Myron, MD  Cardiologist:   Acie Fredrickson Wonda Cheng, MD   Chief Complaint  Patient presents with  . Atrial Fibrillation    Problem List: 1. Atrial fibrillation 2. Chronic systolic CHF   Timothy Martinez is a 64 y.o. male who presents for  Follow up of atrial flutter / fib . I met him in the hospital with atrial flutter when he was admitted with acute pancreatitis.     Feb. 26, 2016:  Timothy Martinez is a 64 y.o. male who presents for follow up of his atrial fib and chronic systolic CHF.   HR is still irreg.   Still short of breath. Still smoking , has cut back  Has not missed any doses of Eliuquis  Past Medical History  Diagnosis Date  . Pancreas disorder   . Diabetes mellitus   . Hyperlipidemia   . Shortness of breath     with ambulation  . Anginal pain   . Pneumonia   . GERD (gastroesophageal reflux disease)   . Headache(784.0)   . Arthritis     Past Surgical History  Procedure Laterality Date  . Back surgery    . Neck surgery      not cervical, states had lesion on neck which was removed  . Splenectomy    . Cardioversion N/A 09/22/2014    Procedure: CARDIOVERSION;  Surgeon: Thayer Headings, MD;  Location: [facility] ENDOSCOPY;  Service: Cardiovascular;  Laterality: N/A;     Current Outpatient Prescriptions  Medication Sig Dispense Refill  . albuterol (PROVENTIL HFA;VENTOLIN HFA) 108 (90 BASE) MCG/ACT inhaler Inhale 1-2 puffs into the lungs every 12 (twelve) hours as needed for wheezing or shortness of breath. 18 Inhaler 12  . apixaban (ELIQUIS) 5 MG TABS tablet Take 1 tablet (5 mg total) by mouth 2 (two) times daily. 90 tablet 3  . atorvastatin (LIPITOR) 40 MG tablet Take 1 tablet (40 mg total) by mouth daily at 6 PM. 90 tablet 3  . baclofen (LIORESAL) 10 MG tablet 3 (three) times daily.    . Blood Glucose Monitoring Suppl (ONE TOUCH ULTRA 2) W/DEVICE KIT  Dispense one glucometer. Diagnosis Type 2 Diabetes. E11.9 1 each 0  . cyclobenzaprine (FLEXERIL) 10 MG tablet Take 1 tablet (10 mg total) by mouth 3 (three) times daily as needed for muscle spasms. 30 tablet 0  . diltiazem (CARDIZEM SR) 120 MG 12 hr capsule TAKE 1 CAPSULE (120 MG TOTAL) BY MOUTH 2 (TWO) TIMES DAILY. 60 capsule 1  . glucose blood (ONE TOUCH ULTRA TEST) test strip Use check fasting blood glucose qAM. Type 2 Diabetes E11.9 100 each 3  . metFORMIN (GLUCOPHAGE) 500 MG tablet Take 2 tablets (1,000 mg total) by mouth 2 (two) times daily with a meal. 120 tablet 1  . metoprolol (LOPRESSOR) 50 MG tablet TAKE 1 TABLET (50 MG TOTAL) BY MOUTH 2 (TWO) TIMES DAILY. 60 tablet 1  . omeprazole (PRILOSEC) 20 MG capsule Take 1 capsule (20 mg total) by mouth daily. 30 capsule 3  . ONE TOUCH LANCETS MISC Check fasting blood glucose qAM. Type 2 DM. E11.9 200 each 1   No current facility-administered medications for this visit.    Allergies:   Tramadol; Morphine and related; and Penicillins    Social History:  The patient  reports that he has been smoking Cigarettes.  He started smoking about  50 years ago. He has a 750 pack-year smoking history. He has never used smokeless tobacco. He reports that he drinks alcohol. He reports that he does not use illicit drugs.   Family History:  The patient's family history includes Alcohol abuse in his father; Aneurysm in his mother; Diabetes in his mother; Hypertension in his mother; Stroke in his mother. There is no history of Heart attack.    ROS:  Please see the history of present illness.    Review of Systems: Constitutional:  denies fever, chills, diaphoresis, appetite change and fatigue.  HEENT: denies photophobia, eye pain, redness, hearing loss, ear pain, congestion, sore throat, rhinorrhea, sneezing, neck pain, neck stiffness and tinnitus.  Respiratory: denies SOB, DOE, cough, chest tightness, and wheezing.  Cardiovascular: denies chest pain,  palpitations and leg swelling.  Gastrointestinal: denies nausea, vomiting, abdominal pain, diarrhea, constipation, blood in stool.  Genitourinary: denies dysuria, urgency, frequency, hematuria, flank pain and difficulty urinating.  Musculoskeletal: denies  myalgias, back pain, joint swelling, arthralgias and gait problem.   Skin: denies pallor, rash and wound.  Neurological: denies dizziness, seizures, syncope, weakness, light-headedness, numbness and headaches.   Hematological: denies adenopathy, easy bruising, personal or family bleeding history.  Psychiatric/ Behavioral: denies suicidal ideation, mood changes, confusion, nervousness, sleep disturbance and agitation.       All other systems are reviewed and negative.    PHYSICAL EXAM: VS:  BP 118/51 mmHg  Pulse 64  Ht '6\' 3"'  (1.905 m)  Wt 193 lb (87.544 kg)  BMI 24.12 kg/m2 , BMI Body mass index is 24.12 kg/(m^2). GEN: Well nourished, well developed, in no acute distress HEENT: normal Neck: no JVD, carotid bruits, or masses Cardiac: irreg. Irreg. ; no murmurs, rubs, or gallops,no edema  Respiratory:  clear to auscultation bilaterally, normal work of breathing GI: soft, nontender, nondistended, + BS MS: no deformity or atrophy Skin: warm and dry, no rash Neuro:  Strength and sensation are intact Psych: normal   EKG:  EKG is ordered today. NSR at 64.   Normal ECG    Recent Labs: 08/05/2014: ALT 21; Magnesium 1.9 09/19/2014: BUN 14; Creatinine 0.73; Hemoglobin 14.2; Platelets 259.0; Potassium 4.3; Sodium 137    Lipid Panel    Component Value Date/Time   CHOL 120 08/05/2014 0845   TRIG 105 08/05/2014 0845   HDL 27* 08/05/2014 0845   CHOLHDL 4.4 08/05/2014 0845   VLDL 21 08/05/2014 0845   LDLCALC 72 08/05/2014 0845      Wt Readings from Last 3 Encounters:  10/20/14 193 lb (87.544 kg)  10/15/14 187 lb 2 oz (84.879 kg)  10/02/14 195 lb (88.451 kg)      Other studies Reviewed: Additional studies/ records that were  reviewed today include: . Review of the above records demonstrates:    ASSESSMENT AND PLAN:  1. Atrial fibrillation: The patient was successfully cardio Wert. He remains in normal sinus rhythm. Continue Eliquis for now.  2. Chronic systolic congestive heart failure: His blood pressures well-controlled. We will continue with the current dose of metoprolol. After the cardioversion I anticipate stopping the diltiazem.    We will add a low-dose ARB.  3. COPD:  Cigarette smoking: I've encouraged him to stop smoking. He has COPD.  Current medicines are reviewed at length with the patient today.  The patient does not have concerns regarding medicines.  The following changes have been made:     Disposition:   FU with me in 1 month.    Signed, Ayven Glasco, Arnette Norris  J, MD  10/20/2014 8:35 AM    [facility] [address], [place], [place]  [address] Phone: [phone]; Fax: [phone]

## 2014-10-20 NOTE — Patient Instructions (Signed)
Your physician has recommended you make the following change in your medication:  STOP Diltiazem  Your physician has requested that you have an echocardiogram in 3 months 1 week before your appointment with Dr. Acie Fredrickson. . Echocardiography is a painless test that uses sound waves to create images of your heart. It provides your doctor with information about the size and shape of your heart and how well your heart's chambers and valves are working. This procedure takes approximately one hour. There are no restrictions for this procedure.  Your physician wants you to follow-up in: 3 months with Dr. Acie Fredrickson (approximately 1 week after your echo).  You will receive a reminder letter in the mail two months in advance. If you don't receive a letter, please call our office to schedule the follow-up appointment.

## 2014-10-20 NOTE — Progress Notes (Deleted)
Cardiology Office Note   Date:  10/20/2014   ID:  Timothy Martinez, DOB [birth year], MRN [id number]  PCP:  Phill Myron, MD  Cardiologist:   Acie Fredrickson Wonda Cheng, MD   Chief Complaint  Patient presents with  . Atrial Fibrillation   1. Atrial fibrillation 2. Chronic systolic CHF   Timothy Martinez is a 64 y.o. male who presents for  Follow up of atrial flutter / fib . I met him in the hospital with atrial flutter when he was admitted with acute pancreatitis.    Feb. 26, 2016:  Timothy Martinez is a 64 y.o. male who presents for follow up of his atrial fib and chronic systolic CHF. HR is still irreg.   Still short of breath. Still smoking , has cut back  Has not missed any doses of Eliuquis   October 20, 2014: Timothy Martinez is a 64 y.o. male who presents for follow up of his atrial fib.     Past Medical History  Diagnosis Date  . Pancreas disorder   . Diabetes mellitus   . Hyperlipidemia   . Shortness of breath     with ambulation  . Anginal pain   . Pneumonia   . GERD (gastroesophageal reflux disease)   . Headache(784.0)   . Arthritis     Past Surgical History  Procedure Laterality Date  . Back surgery    . Neck surgery      not cervical, states had lesion on neck which was removed  . Splenectomy    . Cardioversion N/A 09/22/2014    Procedure: CARDIOVERSION;  Surgeon: Thayer Headings, MD;  Location: [facility] ENDOSCOPY;  Service: Cardiovascular;  Laterality: N/A;     Current Outpatient Prescriptions  Medication Sig Dispense Refill  . albuterol (PROVENTIL HFA;VENTOLIN HFA) 108 (90 BASE) MCG/ACT inhaler Inhale 1-2 puffs into the lungs every 12 (twelve) hours as needed for wheezing or shortness of breath. 18 Inhaler 12  . apixaban (ELIQUIS) 5 MG TABS tablet Take 1 tablet (5 mg total) by mouth 2 (two) times daily. 90 tablet 3  . atorvastatin (LIPITOR) 40 MG tablet Take 1 tablet (40 mg total) by mouth daily at 6 PM. 90 tablet 3  . Blood Glucose Monitoring Suppl (ONE  TOUCH ULTRA 2) W/DEVICE KIT Dispense one glucometer. Diagnosis Type 2 Diabetes. E11.9 1 each 0  . cyclobenzaprine (FLEXERIL) 10 MG tablet Take 1 tablet (10 mg total) by mouth 3 (three) times daily as needed for muscle spasms. 30 tablet 0  . diltiazem (CARDIZEM SR) 120 MG 12 hr capsule TAKE 1 CAPSULE (120 MG TOTAL) BY MOUTH 2 (TWO) TIMES DAILY. 60 capsule 1  . famotidine (PEPCID) 40 MG tablet Take 1 tablet (40 mg total) by mouth daily. (Patient not taking: Reported on 10/02/2014) 30 tablet 1  . glucose blood (ONE TOUCH ULTRA TEST) test strip Use check fasting blood glucose qAM. Type 2 Diabetes E11.9 100 each 3  . metFORMIN (GLUCOPHAGE) 500 MG tablet Take 2 tablets (1,000 mg total) by mouth 2 (two) times daily with a meal. 120 tablet 1  . metoprolol (LOPRESSOR) 50 MG tablet TAKE 1 TABLET (50 MG TOTAL) BY MOUTH 2 (TWO) TIMES DAILY. 60 tablet 1  . omeprazole (PRILOSEC) 20 MG capsule Take 1 capsule (20 mg total) by mouth daily. 30 capsule 3  . ONE TOUCH LANCETS MISC Check fasting blood glucose qAM. Type 2 DM. E11.9 200 each 1   No current facility-administered medications for this visit.  Allergies:   Tramadol; Morphine and related; and Penicillins    Social History:  The patient  reports that he has been smoking Cigarettes.  He started smoking about 50 years ago. He has a 750 pack-year smoking history. He has never used smokeless tobacco. He reports that he drinks alcohol. He reports that he does not use illicit drugs.   Family History:  The patient's ***family history includes Alcohol abuse in his father; Aneurysm in his mother; Diabetes in his mother; Hypertension in his mother; Stroke in his mother. There is no history of Heart attack.    ROS:  Please see the history of present illness.    Review of Systems: Constitutional:  {Actions; denies/reports/admits to:19208} fever, chills, diaphoresis, appetite change and fatigue.  HEENT: {Actions; denies/reports/admits to:19208} photophobia, eye  pain, redness, hearing loss, ear pain, congestion, sore throat, rhinorrhea, sneezing, neck pain, neck stiffness and tinnitus.  Respiratory: {Actions; denies/reports/admits to:19208} SOB, DOE, cough, chest tightness, and wheezing.  Cardiovascular: {Actions; denies/reports/admits to:19208} chest pain, palpitations and leg swelling.  Gastrointestinal: {Actions; denies/reports/admits to:19208} nausea, vomiting, abdominal pain, diarrhea, constipation, blood in stool.  Genitourinary: {Actions; denies/reports/admits to:19208} dysuria, urgency, frequency, hematuria, flank pain and difficulty urinating.  Musculoskeletal: {Actions; denies/reports/admits to:19208}  myalgias, back pain, joint swelling, arthralgias and gait problem.   Skin: {Actions; denies/reports/admits to:19208} pallor, rash and wound.  Neurological: {Actions; denies/reports/admits to:19208} dizziness, seizures, syncope, weakness, light-headedness, numbness and headaches.   Hematological: {Actions; denies/reports/admits to:19208} adenopathy, easy bruising, personal or family bleeding history.  Psychiatric/ Behavioral: {Actions; denies/reports/admits to:19208} suicidal ideation, mood changes, confusion, nervousness, sleep disturbance and agitation.       All other systems are reviewed and negative.    PHYSICAL EXAM: VS:  There were no vitals taken for this visit. , BMI There is no weight on file to calculate BMI. GEN: Well nourished, well developed, in no acute distress HEENT: normal Neck: no JVD, carotid bruits, or masses Cardiac: ***RRR; no murmurs, rubs, or gallops,no edema  Respiratory:  clear to auscultation bilaterally, normal work of breathing GI: soft, nontender, nondistended, + BS MS: no deformity or atrophy Skin: warm and dry, no rash Neuro:  Strength and sensation are intact Psych: normal   EKG:  EKG {ACTION; IS/IS GGY:[id number]} ordered today. The ekg ordered today demonstrates ***   Recent Labs: 08/05/2014: ALT  21; Magnesium 1.9 09/19/2014: BUN 14; Creatinine 0.73; Hemoglobin 14.2; Platelets 259.0; Potassium 4.3; Sodium 137    Lipid Panel    Component Value Date/Time   CHOL 120 08/05/2014 0845   TRIG 105 08/05/2014 0845   HDL 27* 08/05/2014 0845   CHOLHDL 4.4 08/05/2014 0845   VLDL 21 08/05/2014 0845   LDLCALC 72 08/05/2014 0845      Wt Readings from Last 3 Encounters:  10/15/14 187 lb 2 oz (84.879 kg)  10/02/14 195 lb (88.451 kg)  09/24/14 196 lb 12.8 oz (89.268 kg)      Other studies Reviewed: Additional studies/ records that were reviewed today include: ***. Review of the above records demonstrates: ***   ASSESSMENT AND PLAN:  1.  ***   Current medicines are reviewed at length with the patient today.  The patient {ACTIONS; HAS/DOES NOT HAVE:19233} concerns regarding medicines.  The following changes have been made:  {PLAN; NO CHANGE:13088:s}  Labs/ tests ordered today include: *** No orders of the defined types were placed in this encounter.     Disposition:   FU with ***    Signed, Bowen Kia, Wonda Cheng, MD  10/20/2014 8:05 AM  [facility] [address], [place], [place]  [address] Phone: [phone]; Fax: [phone]

## 2014-10-28 ENCOUNTER — Other Ambulatory Visit: Payer: Self-pay | Admitting: Family Medicine

## 2014-10-28 DIAGNOSIS — E119 Type 2 diabetes mellitus without complications: Secondary | ICD-10-CM

## 2014-11-04 ENCOUNTER — Other Ambulatory Visit: Payer: Self-pay | Admitting: Family Medicine

## 2014-11-04 DIAGNOSIS — E785 Hyperlipidemia, unspecified: Secondary | ICD-10-CM

## 2014-11-04 DIAGNOSIS — I4891 Unspecified atrial fibrillation: Secondary | ICD-10-CM

## 2014-11-04 NOTE — Telephone Encounter (Signed)
Refill request. Will forward to PCP for review. Laurann Mcmorris, CMA.

## 2014-11-05 ENCOUNTER — Other Ambulatory Visit: Payer: Self-pay | Admitting: Family Medicine

## 2014-11-05 DIAGNOSIS — M542 Cervicalgia: Secondary | ICD-10-CM

## 2014-11-05 MED ORDER — METOPROLOL TARTRATE 50 MG PO TABS: ORAL_TABLET | ORAL | Status: DC

## 2014-11-05 MED ORDER — DILTIAZEM HCL ER 120 MG PO CP12: ORAL_CAPSULE | ORAL | Status: DC

## 2014-11-05 MED ORDER — ATORVASTATIN CALCIUM 40 MG PO TABS: 40.0000 mg | ORAL_TABLET | Freq: Every day | ORAL | Status: DC

## 2014-11-05 NOTE — Telephone Encounter (Signed)
Refill request. Will forward to PCP for review. Zenia Guest, CMA.

## 2014-11-14 ENCOUNTER — Ambulatory Visit: Payer: Medicare Other | Admitting: Family Medicine

## 2014-12-16 ENCOUNTER — Other Ambulatory Visit: Payer: Self-pay | Admitting: Family Medicine

## 2014-12-24 ENCOUNTER — Other Ambulatory Visit (HOSPITAL_COMMUNITY): Payer: Medicare Other

## 2014-12-29 ENCOUNTER — Encounter: Payer: Self-pay | Admitting: *Deleted

## 2014-12-30 ENCOUNTER — Other Ambulatory Visit: Payer: Self-pay | Admitting: Cardiovascular Disease

## 2014-12-30 ENCOUNTER — Other Ambulatory Visit: Payer: Self-pay

## 2014-12-30 ENCOUNTER — Ambulatory Visit (HOSPITAL_COMMUNITY): Payer: Medicare Other | Attending: Internal Medicine

## 2014-12-30 DIAGNOSIS — I509 Heart failure, unspecified: Secondary | ICD-10-CM | POA: Diagnosis not present

## 2014-12-31 ENCOUNTER — Ambulatory Visit: Payer: Medicare Other | Admitting: Cardiovascular Disease

## 2015-01-05 ENCOUNTER — Telehealth: Payer: Self-pay | Admitting: Cardiovascular Disease

## 2015-01-05 NOTE — Telephone Encounter (Signed)
Pt calling re lab results-pls call

## 2015-01-05 NOTE — Telephone Encounter (Signed)
Pt calling for echo results from last week.  Informed the pt that per Dr Acie Fredrickson, his echo revealed normal LV function. Pt verbalized understanding.

## 2015-01-14 ENCOUNTER — Emergency Department (HOSPITAL_COMMUNITY): Payer: Medicare Other

## 2015-01-14 ENCOUNTER — Other Ambulatory Visit: Payer: Self-pay

## 2015-01-14 ENCOUNTER — Inpatient Hospital Stay (HOSPITAL_COMMUNITY)
Admission: EM | Admit: 2015-01-14 | Discharge: 2015-01-16 | DRG: 439 | Disposition: A | Discharge status: Home / Self Care | Payer: Medicare Other | Attending: Family Medicine | Admitting: Family Medicine

## 2015-01-14 ENCOUNTER — Ambulatory Visit (INDEPENDENT_AMBULATORY_CARE_PROVIDER_SITE_OTHER): Payer: Medicare Other | Admitting: Family Medicine

## 2015-01-14 ENCOUNTER — Encounter: Payer: Self-pay | Admitting: Family Medicine

## 2015-01-14 ENCOUNTER — Encounter (HOSPITAL_COMMUNITY): Payer: Self-pay | Admitting: Emergency Medicine

## 2015-01-14 VITALS — BP 136/73 | HR 110 | Temp 98.0°F | Ht 75.0 in | Wt 182.1 lb

## 2015-01-14 DIAGNOSIS — E119 Type 2 diabetes mellitus without complications: Secondary | ICD-10-CM

## 2015-01-14 DIAGNOSIS — R079 Chest pain, unspecified: Secondary | ICD-10-CM | POA: Diagnosis not present

## 2015-01-14 DIAGNOSIS — F17210 Nicotine dependence, cigarettes, uncomplicated: Secondary | ICD-10-CM | POA: Diagnosis present

## 2015-01-14 DIAGNOSIS — Z79899 Other long term (current) drug therapy: Secondary | ICD-10-CM

## 2015-01-14 DIAGNOSIS — K860 Alcohol-induced chronic pancreatitis: Secondary | ICD-10-CM | POA: Diagnosis present

## 2015-01-14 DIAGNOSIS — R1084 Generalized abdominal pain: Secondary | ICD-10-CM

## 2015-01-14 DIAGNOSIS — K852 Alcohol induced acute pancreatitis without necrosis or infection: Secondary | ICD-10-CM | POA: Insufficient documentation

## 2015-01-14 DIAGNOSIS — I4581 Long QT syndrome: Secondary | ICD-10-CM | POA: Diagnosis present

## 2015-01-14 DIAGNOSIS — K859 Acute pancreatitis without necrosis or infection, unspecified: Secondary | ICD-10-CM | POA: Diagnosis present

## 2015-01-14 DIAGNOSIS — M1990 Unspecified osteoarthritis, unspecified site: Secondary | ICD-10-CM | POA: Diagnosis present

## 2015-01-14 DIAGNOSIS — R1013 Epigastric pain: Secondary | ICD-10-CM | POA: Diagnosis not present

## 2015-01-14 DIAGNOSIS — R9431 Abnormal electrocardiogram [ECG] [EKG]: Secondary | ICD-10-CM | POA: Diagnosis not present

## 2015-01-14 DIAGNOSIS — I509 Heart failure, unspecified: Secondary | ICD-10-CM | POA: Diagnosis present

## 2015-01-14 DIAGNOSIS — E785 Hyperlipidemia, unspecified: Secondary | ICD-10-CM | POA: Diagnosis present

## 2015-01-14 DIAGNOSIS — K219 Gastro-esophageal reflux disease without esophagitis: Secondary | ICD-10-CM | POA: Diagnosis present

## 2015-01-14 DIAGNOSIS — Z7901 Long term (current) use of anticoagulants: Secondary | ICD-10-CM

## 2015-01-14 DIAGNOSIS — F1010 Alcohol abuse, uncomplicated: Secondary | ICD-10-CM | POA: Diagnosis present

## 2015-01-14 DIAGNOSIS — J449 Chronic obstructive pulmonary disease, unspecified: Secondary | ICD-10-CM | POA: Diagnosis not present

## 2015-01-14 DIAGNOSIS — I4891 Unspecified atrial fibrillation: Secondary | ICD-10-CM | POA: Diagnosis present

## 2015-01-14 DIAGNOSIS — F1011 Alcohol abuse, in remission: Secondary | ICD-10-CM | POA: Diagnosis present

## 2015-01-14 DIAGNOSIS — J984 Other disorders of lung: Secondary | ICD-10-CM | POA: Diagnosis not present

## 2015-01-14 DIAGNOSIS — K863 Pseudocyst of pancreas: Secondary | ICD-10-CM | POA: Diagnosis present

## 2015-01-14 HISTORY — DX: Pseudocyst of pancreas: K86.3

## 2015-01-14 HISTORY — DX: Alcohol abuse, uncomplicated: F10.10

## 2015-01-14 HISTORY — DX: Unspecified atrial fibrillation: I48.91

## 2015-01-14 HISTORY — DX: Alcohol induced acute pancreatitis without necrosis or infection: K85.20

## 2015-01-14 HISTORY — DX: Heart failure, unspecified: I50.9

## 2015-01-14 LAB — COMPREHENSIVE METABOLIC PANEL
ALT: 14 U/L — ABNORMAL LOW (ref 17–63)
AST: 22 U/L (ref 15–41)
Albumin: 4.1 g/dL (ref 3.5–5.0)
Alkaline Phosphatase: 58 U/L (ref 38–126)
Anion gap: 11 (ref 5–15)
BUN: 8 mg/dL (ref 6–20)
CO2: 27 mmol/L (ref 22–32)
Calcium: 9.7 mg/dL (ref 8.9–10.3)
Chloride: 99 mmol/L — ABNORMAL LOW (ref 101–111)
Creatinine, Ser: 0.81 mg/dL (ref 0.61–1.24)
GFR calc Af Amer: >60 mL/min (ref >60)
GFR calc non Af Amer: >60 mL/min (ref >60)
Glucose, Bld: 141 mg/dL — ABNORMAL HIGH (ref 65–99)
Potassium: 4 mmol/L (ref 3.5–5.1)
Sodium: 137 mmol/L (ref 135–145)
Total Bilirubin: 0.9 mg/dL (ref 0.3–1.2)
Total Protein: 8 g/dL (ref 6.5–8.1)

## 2015-01-14 LAB — CBC WITH DIFFERENTIAL/PLATELET
Basophils Absolute: 0.1 10*3/uL (ref 0.0–0.1)
Basophils Relative: 1 % (ref 0–1)
Eosinophils Absolute: 0.3 10*3/uL (ref 0.0–0.7)
Eosinophils Relative: 3 % (ref 0–5)
HCT: 44.9 % (ref 39.0–52.0)
Hemoglobin: 15.5 g/dL (ref 13.0–17.0)
Lymphocytes Relative: 22 % (ref 12–46)
Lymphs Abs: 2.5 10*3/uL (ref 0.7–4.0)
MCH: 31.3 pg (ref 26.0–34.0)
MCHC: 34.5 g/dL (ref 30.0–36.0)
MCV: 90.7 fL (ref 78.0–100.0)
Monocytes Absolute: 1.4 10*3/uL — ABNORMAL HIGH (ref 0.1–1.0)
Monocytes Relative: 13 % — ABNORMAL HIGH (ref 3–12)
Neutro Abs: 6.8 10*3/uL (ref 1.7–7.7)
Neutrophils Relative %: 61 % (ref 43–77)
Platelets: 244 10*3/uL (ref 150–400)
RBC: 4.95 MIL/uL (ref 4.22–5.81)
RDW: 14.2 % (ref 11.5–15.5)
WBC: 11.1 10*3/uL — ABNORMAL HIGH (ref 4.0–10.5)

## 2015-01-14 LAB — TROPONIN I: Troponin I: <0.03 ng/mL (ref <0.031)

## 2015-01-14 LAB — RAPID URINE DRUG SCREEN, HOSP PERFORMED
Amphetamines: NOT DETECTED
Barbiturates: NOT DETECTED
Benzodiazepines: NOT DETECTED
Cocaine: NOT DETECTED
Opiates: POSITIVE — AB
Tetrahydrocannabinol: NOT DETECTED

## 2015-01-14 LAB — LIPASE, BLOOD: Lipase: 175 U/L — ABNORMAL HIGH (ref 22–51)

## 2015-01-14 LAB — GLUCOSE, CAPILLARY
Glucose-Capillary: 95 mg/dL (ref 65–99)
Glucose-Capillary: 123 mg/dL — ABNORMAL HIGH (ref 65–99)
Glucose-Capillary: 79 mg/dL (ref 65–99)

## 2015-01-14 LAB — PROTIME-INR
INR: 1.1 (ref 0.00–1.49)
Prothrombin Time: 14.4 seconds (ref 11.6–15.2)

## 2015-01-14 LAB — ETHANOL: Alcohol, Ethyl (B): <5 mg/dL (ref <5)

## 2015-01-14 MED ORDER — LORAZEPAM 2 MG/ML IJ SOLN
1.0000 mg | Freq: Four times a day (QID) | INTRAMUSCULAR | Status: DC | PRN
  Administered 2015-01-14: 1 mg via INTRAVENOUS
  Filled 2015-01-14: qty 1

## 2015-01-14 MED ORDER — FAMOTIDINE IN NACL 20-0.9 MG/50ML-% IV SOLN
20.0000 mg | Freq: Once | INTRAVENOUS | Status: AC
  Administered 2015-01-14: 20 mg via INTRAVENOUS
  Filled 2015-01-14: qty 50

## 2015-01-14 MED ORDER — ONDANSETRON HCL 4 MG/2ML IJ SOLN
4.0000 mg | INTRAMUSCULAR | Status: DC | PRN
  Administered 2015-01-14: 4 mg via INTRAVENOUS
  Filled 2015-01-14: qty 2

## 2015-01-14 MED ORDER — INSULIN ASPART 100 UNIT/ML ~~LOC~~ SOLN
0.0000 [IU] | SUBCUTANEOUS | Status: DC
Start: 2015-01-14 — End: 2015-01-16
  Administered 2015-01-15: 2 [IU] via SUBCUTANEOUS

## 2015-01-14 MED ORDER — METOPROLOL TARTRATE 50 MG PO TABS
50.0000 mg | ORAL_TABLET | Freq: Two times a day (BID) | ORAL | Status: DC
  Administered 2015-01-14 – 2015-01-16 (×5): 50 mg via ORAL
  Filled 2015-01-14 (×5): qty 1

## 2015-01-14 MED ORDER — DOCUSATE SODIUM 100 MG PO CAPS
100.0000 mg | ORAL_CAPSULE | Freq: Two times a day (BID) | ORAL | Status: DC
  Administered 2015-01-14 – 2015-01-16 (×4): 100 mg via ORAL
  Filled 2015-01-14 (×4): qty 1

## 2015-01-14 MED ORDER — M.V.I. ADULT IV INJ
INJECTION | Freq: Once | INTRAVENOUS | Status: AC
  Administered 2015-01-14: 15:00:00 via INTRAVENOUS
  Filled 2015-01-14: qty 1000

## 2015-01-14 MED ORDER — HYDROMORPHONE HCL 1 MG/ML IJ SOLN
1.0000 mg | INTRAMUSCULAR | Status: DC | PRN
  Administered 2015-01-14 – 2015-01-15 (×6): 1 mg via INTRAVENOUS
  Filled 2015-01-14 (×6): qty 1

## 2015-01-14 MED ORDER — FENTANYL CITRATE (PF) 100 MCG/2ML IJ SOLN
50.0000 ug | INTRAMUSCULAR | Status: AC | PRN
  Administered 2015-01-14 (×2): 50 ug via INTRAVENOUS
  Filled 2015-01-14 (×2): qty 2

## 2015-01-14 MED ORDER — ALBUTEROL SULFATE HFA 108 (90 BASE) MCG/ACT IN AERS: 1.0000 | INHALATION_SPRAY | Freq: Two times a day (BID) | RESPIRATORY_TRACT | Status: DC | PRN

## 2015-01-14 MED ORDER — ONDANSETRON HCL 4 MG/2ML IJ SOLN: 4.0000 mg | Freq: Three times a day (TID) | INTRAMUSCULAR | Status: DC | PRN

## 2015-01-14 MED ORDER — SODIUM CHLORIDE 0.9 % IV SOLN
INTRAVENOUS | Status: DC
  Administered 2015-01-14 – 2015-01-16 (×5): via INTRAVENOUS

## 2015-01-14 MED ORDER — DILTIAZEM HCL ER 60 MG PO CP12
120.0000 mg | ORAL_CAPSULE | Freq: Two times a day (BID) | ORAL | Status: DC
  Administered 2015-01-14 – 2015-01-16 (×4): 120 mg via ORAL
  Filled 2015-01-14 (×6): qty 2

## 2015-01-14 MED ORDER — LORAZEPAM 1 MG PO TABS: 1.0000 mg | ORAL_TABLET | Freq: Four times a day (QID) | ORAL | Status: DC | PRN

## 2015-01-14 MED ORDER — HEPARIN SODIUM (PORCINE) 5000 UNIT/ML IJ SOLN: 5000.0000 [IU] | Freq: Three times a day (TID) | INTRAMUSCULAR | Status: DC

## 2015-01-14 MED ORDER — SODIUM CHLORIDE 0.9 % IV SOLN: INTRAVENOUS | Status: AC

## 2015-01-14 MED ORDER — SODIUM CHLORIDE 0.9 % IJ SOLN
3.0000 mL | Freq: Two times a day (BID) | INTRAMUSCULAR | Status: DC
  Administered 2015-01-14: 3 mL via INTRAVENOUS

## 2015-01-14 MED ORDER — ADULT MULTIVITAMIN W/MINERALS CH
1.0000 | ORAL_TABLET | Freq: Every day | ORAL | Status: DC
  Administered 2015-01-15 – 2015-01-16 (×2): 1 via ORAL
  Filled 2015-01-14 (×2): qty 1

## 2015-01-14 MED ORDER — ALBUTEROL SULFATE (2.5 MG/3ML) 0.083% IN NEBU
2.5000 mg | INHALATION_SOLUTION | Freq: Two times a day (BID) | RESPIRATORY_TRACT | Status: DC | PRN
  Administered 2015-01-15: 2.5 mg via RESPIRATORY_TRACT
  Filled 2015-01-14: qty 3

## 2015-01-14 MED ORDER — ATORVASTATIN CALCIUM 40 MG PO TABS
40.0000 mg | ORAL_TABLET | Freq: Every day | ORAL | Status: DC
  Administered 2015-01-15: 40 mg via ORAL
  Filled 2015-01-14 (×2): qty 1

## 2015-01-14 MED ORDER — HYDROMORPHONE HCL 1 MG/ML IJ SOLN
1.0000 mg | INTRAMUSCULAR | Status: DC | PRN
  Administered 2015-01-14: 1 mg via INTRAVENOUS
  Filled 2015-01-14: qty 1

## 2015-01-14 MED ORDER — METOCLOPRAMIDE HCL 5 MG/ML IJ SOLN
10.0000 mg | Freq: Three times a day (TID) | INTRAMUSCULAR | Status: DC | PRN
  Administered 2015-01-15: 10 mg via INTRAVENOUS
  Filled 2015-01-14: qty 2

## 2015-01-14 MED ORDER — APIXABAN 5 MG PO TABS
5.0000 mg | ORAL_TABLET | Freq: Two times a day (BID) | ORAL | Status: DC
  Administered 2015-01-14 – 2015-01-16 (×4): 5 mg via ORAL
  Filled 2015-01-14 (×4): qty 1

## 2015-01-14 MED ORDER — PANTOPRAZOLE SODIUM 40 MG PO TBEC
40.0000 mg | DELAYED_RELEASE_TABLET | Freq: Every day | ORAL | Status: DC
  Administered 2015-01-15 – 2015-01-16 (×2): 40 mg via ORAL
  Filled 2015-01-14 (×2): qty 1

## 2015-01-14 NOTE — H&P (Signed)
[facility] Admission History and Physical Service Pager: [phone]  Patient name: Timothy Martinez Medical record number: [id number] Date of birth: 01-16-1951 Age: 64 y.o. Gender: male  Primary Care Provider: Phill Myron, MD Consultants: None Code Status: Full  Chief Complaint: Abdominal pain  Assessment and Plan: Timothy Martinez is a 64 y.o. male with a history of alcoholic pancreatitis presenting for abdominal pain and lipase elevation consistent with acute on chronic pancreatitis. PMH is also significant for COPD, DM, HLD, GERD.  Acute on chronic pancreatitis with pseudocyst noted Jan 2016: Lipase elevated at 175 after recent binge drinking.  - admit to telemetry (current NSR, h/o AFib w/RVR with last flare) - Start with clear liquids as tolerated - Banana bag '@200cc' /hr x1L then NS '@100cc' /hr - dilaudid 4m q3hrs PRN - continue PPI (h/o GERD) - Recheck CBC, CMP in AM - Defer further imaging at this time. No signs of SIRS. Would get cultures and start empiric abx (e.g. imipenem) if suspecting pseudocyst infection.   Atrial fibrillation: in nsr since cardioversion. Echo 12/30/2014 with EF 55-60%, G1DD, mod LVH. Followed by Dr. [name] - continue eliquis - continue diltiazem 1253mBID, metoprolol  T2DM: A1c 7.29 Jul 2014.  - Hold metformin - SSI and CBGs q4h while NPO - continue lipitor 4053mQTc prolongation: QTc 547m6mon ECG in ED - Telemetry - Limit QT prolonging medications (D/C zofran)  History of alcohol abuse: Weekend drinking is most likely trigger for this. EtOH negative at admission.  - CIWA protocol - UDS  FEN/GI: Banana bag x1L then NS @ 100cc/hr, clear liquids Prophylaxis: Eliquis as above  Disposition: Admit to telemetry, FMTS, Dr. NealNori Riisstory of Present Illness: Timothy Martinez 64 y65. male presenting with abdominal pain  [facility] constant epigastric pain for the past 2  days described as burning and pressure radiating from upper abdomen to the back and superiorly and inferiorly, associated with nausea without emesis. He has a history of alcohol-associated pancreatitis since about 2004. He was recently on vacation at the beach and drank about 12 beers on 6/18. He was sent to the ED for further evaluation and found to have lipase elevation concerning for acute on chronic pancreatitis. He denies fever, current chest pain, dyspnea, change in bowel or bladder habits, stools.   Review Of Systems: Per HPI Otherwise 12 point review of systems was performed and was unremarkable.  Patient Active Problem List   Diagnosis Date Noted  . Neck pain, bilateral posterior 10/15/2014  . Type 2 diabetes mellitus without complication   . Chest pain   . Atrial flutter, unspecified   . Atrial fibrillation with rapid ventricular response   . COLD (chronic obstructive lung disease)   . Pancreatitis 08/04/2014  . Abdominal pain 08/04/2014  . Left foot pain 10/12/2013  . Low back pain 12/21/2012  . Abdominal pain, unspecified site 09/28/2012  . Polyuria 08/21/2012  . COPD (chronic obstructive pulmonary disease) 04/27/2012  . GERD (gastroesophageal reflux disease) 03/05/2012  . Alcohol abuse 06/09/2011  . Diabetes mellitus 05/30/2011  . Chronic alcoholic pancreatitis 11/098/92/1194Hyperlipidemia 05/30/2011  . Tobacco abuse 05/30/2011   Past Medical History: Past Medical History  Diagnosis Date  . Diabetes mellitus   . Hyperlipidemia   . Shortness of breath     with ambulation  . Anginal pain   . Pneumonia   . GERD (gastroesophageal reflux disease)   .  Headache(784.0)   . Arthritis   . Alcoholic pancreatitis   . COPD (chronic obstructive pulmonary disease)   . Atrial fibrillation status post cardioversion 08/2014  . CHF (congestive heart failure)   . Alcohol abuse   . Tobacco use disorder, continuous   . Pancreatic pseudocyst     seen on CT scan 07/2014   Past  Surgical History: Past Surgical History  Procedure Laterality Date  . Back surgery    . Neck surgery      not cervical, states had lesion on neck which was removed  . Splenectomy    . Cardioversion N/A 09/22/2014    Procedure: CARDIOVERSION;  Surgeon: Thayer Headings, MD;  Location: [facility] ENDOSCOPY;  Service: Cardiovascular;  Laterality: N/A;   Social History: History  Substance Use Topics  . Smoking status: Current Every Day Smoker -- 15.00 packs/day for 50 years    Types: Cigarettes    Start date: 04/09/1964  . Smokeless tobacco: Never Used     Comment: trying to quit. smoked 3 ppd before  . Alcohol Use: 0.0 oz/week    6-7 Cans of beer per week     Comment: "a beer once in a while"   Please also refer to relevant sections of EMR.  Family History: Family History  Problem Relation Age of Onset  . Diabetes Mother   . Aneurysm Mother   . Alcohol abuse Father   . Heart attack Neg Hx   . Stroke Mother   . Hypertension Mother    Allergies and Medications: Allergies  Allergen Reactions  . Tramadol Nausea And Vomiting  . Morphine And Related Swelling    Pt states he can tolerate oxycodone and hydromorphone  . Penicillins Rash   No current facility-administered medications on file prior to encounter.   Current Outpatient Prescriptions on File Prior to Encounter  Medication Sig Dispense Refill  . albuterol (PROVENTIL HFA;VENTOLIN HFA) 108 (90 BASE) MCG/ACT inhaler Inhale 1-2 puffs into the lungs every 12 (twelve) hours as needed for wheezing or shortness of breath. 18 Inhaler 12  . apixaban (ELIQUIS) 5 MG TABS tablet Take 1 tablet (5 mg total) by mouth 2 (two) times daily. 90 tablet 3  . atorvastatin (LIPITOR) 40 MG tablet Take 1 tablet (40 mg total) by mouth daily at 6 PM. 90 tablet 3  . metFORMIN (GLUCOPHAGE) 500 MG tablet TAKE 2 TABLETS (1,000 MG TOTAL) BY MOUTH 2 (TWO) TIMES DAILY WITH A MEAL. 120 tablet 2  . metoprolol (LOPRESSOR) 50 MG tablet TAKE 1 TABLET (50 MG TOTAL) BY  MOUTH 2 (TWO) TIMES DAILY. 60 tablet 2  . omeprazole (PRILOSEC) 20 MG capsule TAKE 1 CAPSULE (20 MG TOTAL) BY MOUTH DAILY. 30 capsule 3  . Blood Glucose Monitoring Suppl (ONE TOUCH ULTRA 2) W/DEVICE KIT Dispense one glucometer. Diagnosis Type 2 Diabetes. E11.9 1 each 0  . diltiazem (CARDIZEM SR) 120 MG 12 hr capsule TAKE 1 CAPSULE (120 MG TOTAL) BY MOUTH 2 (TWO) TIMES DAILY. (Patient not taking: Reported on 01/14/2015) 60 capsule 0  . famotidine (PEPCID) 40 MG tablet TAKE 1 TABLET (40 MG TOTAL) BY MOUTH DAILY. (Patient not taking: Reported on 01/14/2015) 30 tablet 1  . glucose blood (ONE TOUCH ULTRA TEST) test strip Use check fasting blood glucose qAM. Type 2 Diabetes E11.9 100 each 3  . ONE TOUCH LANCETS MISC Check fasting blood glucose qAM. Type 2 DM. E11.9 200 each 1    Objective: BP 128/70 mmHg  Pulse 79  Temp(Src) 98.1 F (  36.7 C) (Oral)  Resp 20  SpO2 95% Exam: General: Well-appearing 64 yo male in some pain, resting quietly Eyes: normal ENTM: oropharynx clear, tacky MM, poor dentition Neck: Supple Cardiovascular: Regular rate, no murmur, rub or gallop, no JVD or LE edema Respiratory: Nonlabored, clear breath sounds Abdomen: Soft with pain to even light palpation worst in epigastrium and least inferiorly. Some voluntary guarding without rebound or rigidity. Hyperactive bowel sounds. Organomegaly difficult to appreciate given tenderness. MSK: No deformities Skin: Purpura on dorsal forearms and hands. Neuro: Alert, oriented, speech normal, no focal deficits Psych: Euthymic, congruent affect, judgment poor, insight fair  Labs and Imaging: CBC BMET   Recent Labs Lab 01/14/15 0940  WBC 11.1*  HGB 15.5  HCT 44.9  PLT 244    Recent Labs Lab 01/14/15 0940  NA 137  K 4.0  CL 99*  CO2 27  BUN 8  CREATININE 0.81  GLUCOSE 141*  CALCIUM 9.7     lipase: 175  Patrecia Pour, MD 01/14/2015, 11:05 AM PGY-2, [place] [facility] pager: [phone], text pages  welcome

## 2015-01-14 NOTE — ED Notes (Signed)
Patient transported to X-ray 

## 2015-01-14 NOTE — ED Notes (Signed)
Patient returned from X-ray 

## 2015-01-14 NOTE — ED Provider Notes (Signed)
CSN: [id number]     Arrival date & time 01/14/15  0911 History   First MD Initiated Contact with Patient 01/14/15 0914     Chief Complaint  Patient presents with  . Abdominal Pain  . Chest Pain      HPI Pt was seen at 0920.  Per pt, c/o gradual onset and persistence of constant upper abd "pain" for the past 2 days.  Has been associated with nausea. States his symptoms began "after I had a few beers over the weekend." Describes the abd pain as "pressure," and "like the last time I had pancreatitis." States the pain radiates up into his chest and into his back. Pt was evaluated by his PMD PTA, then sent to the ED for further evaluation.  Denies vomiting/diarrhea, no fevers, no back pain, no rash, no CP/SOB, no black or blood in stools.       Past Medical History  Diagnosis Date  . Diabetes mellitus   . Hyperlipidemia   . Shortness of breath     with ambulation  . Anginal pain   . Pneumonia   . GERD (gastroesophageal reflux disease)   . Headache(784.0)   . Arthritis   . Alcoholic pancreatitis   . COPD (chronic obstructive pulmonary disease)   . Atrial fibrillation status post cardioversion 08/2014  . CHF (congestive heart failure)   . Alcohol abuse   . Tobacco use disorder, continuous   . Pancreatic pseudocyst     seen on CT scan 07/2014   Past Surgical History  Procedure Laterality Date  . Back surgery    . Neck surgery      not cervical, states had lesion on neck which was removed  . Splenectomy    . Cardioversion N/A 09/22/2014    Procedure: CARDIOVERSION;  Surgeon: Thayer Headings, MD;  Location: [facility] ENDOSCOPY;  Service: Cardiovascular;  Laterality: N/A;   Family History  Problem Relation Age of Onset  . Diabetes Mother   . Aneurysm Mother   . Alcohol abuse Father   . Heart attack Neg Hx   . Stroke Mother   . Hypertension Mother    History  Substance Use Topics  . Smoking status: Current Every Day Smoker -- 15.00 packs/day for 50 years    Types: Cigarettes   Start date: 04/09/1964  . Smokeless tobacco: Never Used     Comment: trying to quit. smoked 3 ppd before  . Alcohol Use: 0.0 oz/week    6-7 Cans of beer per week     Comment: "a beer once in a while"    Review of Systems ROS: Statement: All systems negative except as marked or noted in the HPI; Constitutional: Negative for fever and chills. ; ; Eyes: Negative for eye pain, redness and discharge. ; ; ENMT: Negative for ear pain, hoarseness, nasal congestion, sinus pressure and sore throat. ; ; Cardiovascular: Negative for chest pain, palpitations, diaphoresis, dyspnea and peripheral edema. ; ; Respiratory: Negative for cough, wheezing and stridor. ; ; Gastrointestinal: +nausea, abd pain. Negative for vomiting, diarrhea, blood in stool, hematemesis, jaundice and rectal bleeding. . ; ; Genitourinary: Negative for dysuria, flank pain and hematuria. ; ; Musculoskeletal: Negative for back pain and neck pain. Negative for swelling and trauma.; ; Skin: Negative for pruritus, rash, abrasions, blisters, bruising and skin lesion.; ; Neuro: Negative for headache, lightheadedness and neck stiffness. Negative for weakness, altered level of consciousness , altered mental status, extremity weakness, paresthesias, involuntary movement, seizure and syncope.  Allergies  Tramadol; Morphine and related; and Penicillins  Home Medications   Prior to Admission medications   Medication Sig Start Date End Date Taking? Authorizing Provider  albuterol (PROVENTIL HFA;VENTOLIN HFA) 108 (90 BASE) MCG/ACT inhaler Inhale 1-2 puffs into the lungs every 12 (twelve) hours as needed for wheezing or shortness of breath. 09/24/14   Dickie La, MD  apixaban (ELIQUIS) 5 MG TABS tablet Take 1 tablet (5 mg total) by mouth 2 (two) times daily. 08/19/14   Thayer Headings, MD  atorvastatin (LIPITOR) 40 MG tablet Take 1 tablet (40 mg total) by mouth daily at 6 PM. 11/05/14   Olam Idler, MD  baclofen (LIORESAL) 10 MG tablet 3 (three)  times daily. 09/24/14   Historical Provider, MD  Blood Glucose Monitoring Suppl (ONE TOUCH ULTRA 2) W/DEVICE KIT Dispense one glucometer. Diagnosis Type 2 Diabetes. E11.9 09/12/14   Willeen Niece, MD  cyclobenzaprine (FLEXERIL) 10 MG tablet TAKE 1 TABLET (10 MG TOTAL) BY MOUTH 3 (THREE) TIMES DAILY AS NEEDED FOR MUSCLE SPASMS. 11/05/14   Olam Idler, MD  diltiazem (CARDIZEM SR) 120 MG 12 hr capsule TAKE 1 CAPSULE (120 MG TOTAL) BY MOUTH 2 (TWO) TIMES DAILY. 11/05/14   Olam Idler, MD  famotidine (PEPCID) 40 MG tablet TAKE 1 TABLET (40 MG TOTAL) BY MOUTH DAILY. 11/05/14   Olam Idler, MD  glucose blood (ONE TOUCH ULTRA TEST) test strip Use check fasting blood glucose qAM. Type 2 Diabetes E11.9 09/12/14   Willeen Niece, MD  metFORMIN (GLUCOPHAGE) 500 MG tablet TAKE 2 TABLETS (1,000 MG TOTAL) BY MOUTH 2 (TWO) TIMES DAILY WITH A MEAL. 10/29/14   Olam Idler, MD  metoprolol (LOPRESSOR) 50 MG tablet TAKE 1 TABLET (50 MG TOTAL) BY MOUTH 2 (TWO) TIMES DAILY. 11/05/14   Olam Idler, MD  omeprazole (PRILOSEC) 20 MG capsule TAKE 1 CAPSULE (20 MG TOTAL) BY MOUTH DAILY. 12/16/14   Olam Idler, MD  ONE TOUCH LANCETS MISC Check fasting blood glucose qAM. Type 2 DM. E11.9 09/12/14   Willeen Niece, MD   BP 126/85 mmHg  Pulse 84  Temp(Src) 98.1 F (36.7 C) (Oral)  Resp 18  SpO2 97%   Physical Exam  0925: Physical examination:  Nursing notes reviewed; Vital signs and O2 SAT reviewed;  Constitutional: Well developed, Well nourished, Well hydrated, Uncomfortable appearing.; Head:  Normocephalic, atraumatic; Eyes: EOMI, PERRL, No scleral icterus; ENMT: Mouth and pharynx normal, Mucous membranes moist; Neck: Supple, Full range of motion, No lymphadenopathy; Cardiovascular: Regular rate and rhythm, No gallop; Respiratory: Breath sounds clear & equal bilaterally, No rales, rhonchi, wheezes.  Speaking full sentences with ease, Normal respiratory effort/excursion; Chest: Nontender, Movement normal; Abdomen: Soft,  +mid-epigastric > diffuse tenderness to palp. No rebound or guarding. Nondistended, Normal bowel sounds; Genitourinary: No CVA tenderness; Extremities: Pulses normal, No tenderness, No edema, No calf edema or asymmetry.; Neuro: AA&Ox3, Major CN grossly intact.  Speech clear. No gross focal motor or sensory deficits in extremities. Climbs on and off stretcher easily by himself. Gait steady.; Skin: Color normal, Warm, Dry.   ED Course  Procedures      EKG Interpretation   Date/Time:  Wednesday January 14 2015 09:17:16 EDT Ventricular Rate:  96 PR Interval:  103 QRS Duration: 165 QT Interval:  433 QTC Calculation: 547 R Axis:   87 Text Interpretation:  Age not entered, assumed to be  64 years old for  purpose of ECG interpretation Normal sinus rhythm Ventricular  premature  complex Short PR interval IVCD, consider atypical RBBB When compared with  ECG of 09/22/2014 Premature ventricular complexes are now Present Confirmed  by [facility]  MD, Nunzio Cory [phone]) on 01/14/2015 9:40:16 AM      MDM  MDM Reviewed: nursing note, previous chart and vitals Reviewed previous: labs, ECG, CT scan and ultrasound Interpretation: labs, ECG and x-ray      Results for orders placed or performed during the hospital encounter of 01/14/15  Troponin I  Result Value Ref Range   Troponin I <0.03 <0.031 ng/mL  Lipase, blood  Result Value Ref Range   Lipase 175 (H) 22 - 51 U/L  Comprehensive metabolic panel  Result Value Ref Range   Sodium 137 135 - 145 mmol/L   Potassium 4.0 3.5 - 5.1 mmol/L   Chloride 99 (L) 101 - 111 mmol/L   CO2 27 22 - 32 mmol/L   Glucose, Bld 141 (H) 65 - 99 mg/dL   BUN 8 6 - 20 mg/dL   Creatinine, Ser 0.81 0.61 - 1.24 mg/dL   Calcium 9.7 8.9 - 10.3 mg/dL   Total Protein 8.0 6.5 - 8.1 g/dL   Albumin 4.1 3.5 - 5.0 g/dL   AST 22 15 - 41 U/L   ALT 14 (L) 17 - 63 U/L   Alkaline Phosphatase 58 38 - 126 U/L   Total Bilirubin 0.9 0.3 - 1.2 mg/dL   GFR calc non Af Amer >60 >60  mL/min   GFR calc Af Amer >60 >60 mL/min   Anion gap 11 5 - 15  Ethanol  Result Value Ref Range   Alcohol, Ethyl (B) <5 <5 mg/dL  CBC with Differential  Result Value Ref Range   WBC 11.1 (H) 4.0 - 10.5 K/uL   RBC 4.95 4.22 - 5.81 MIL/uL   Hemoglobin 15.5 13.0 - 17.0 g/dL   HCT 44.9 39.0 - 52.0 %   MCV 90.7 78.0 - 100.0 fL   MCH 31.3 26.0 - 34.0 pg   MCHC 34.5 30.0 - 36.0 g/dL   RDW 14.2 11.5 - 15.5 %   Platelets 244 150 - 400 K/uL   Neutrophils Relative % 61 43 - 77 %   Neutro Abs 6.8 1.7 - 7.7 K/uL   Lymphocytes Relative 22 12 - 46 %   Lymphs Abs 2.5 0.7 - 4.0 K/uL   Monocytes Relative 13 (H) 3 - 12 %   Monocytes Absolute 1.4 (H) 0.1 - 1.0 K/uL   Eosinophils Relative 3 0 - 5 %   Eosinophils Absolute 0.3 0.0 - 0.7 K/uL   Basophils Relative 1 0 - 1 %   Basophils Absolute 0.1 0.0 - 0.1 K/uL  Protime-INR  Result Value Ref Range   Prothrombin Time 14.4 11.6 - 15.2 seconds   INR 1.10 0.00 - 1.49   Dg Abd Acute W/chest 01/14/2015   CLINICAL DATA:  Acute on chronic epigastric pain.  EXAM: DG ABDOMEN ACUTE W/ 1V CHEST  COMPARISON:  August 07, 2014.  FINDINGS: There is no evidence of dilated bowel loops or free intraperitoneal air. Phleboliths are noted in the pelvis. Heart size and mediastinal contours are within normal limits. No pneumothorax or pleural effusion is noted. Stable interstitial densities are noted throughout both lungs consistent with chronic interstitial lung disease or scarring.  IMPRESSION: No evidence of bowel obstruction or ileus. Stable interstitial densities noted throughout both lungs consistent with chronic interstitial lung disease or scarring.   Electronically Signed   By: Marijo Conception,  M.D.   On: 01/14/2015 10:20    1050:  Dx and testing d/w pt.  Questions answered.  Verb understanding, agreeable to admit.  T/C to [facility] Resident, case discussed, including:  HPI, pertinent PM/SHx, VS/PE, dx testing, ED course and treatment:  Agreeable to admit, requests to write  temporary orders, obtain tele bed to Dr. Verlon Au service.   Francine Graven, DO 01/18/15 1339

## 2015-01-14 NOTE — Care Management (Signed)
Utilization review completed by Laymon Stockert N. Tonica Brasington, RN BSN 

## 2015-01-14 NOTE — Progress Notes (Signed)
   Subjective:    Patient ID: Timothy Martinez, male    DOB: 03-19-1951, 64 y.o.   MRN: [id number]  HPI  Patient presents for Same Day Appointment  CC: chest and stomach pain  # Chest/Abdominal pain:  Started Monday around 6pm, bad enough he thought he needed to go to the ED last night  Pain is constant, located mostly upper abdomen but all over as well. Not associated with position changes  Pain radiates into back on both sides, has CP (see below)  Feels like similar episodes of pancreatitis in the past  Last alcohol use was over the weekend, drank "a few beers"  Feels nauseated, no vomiting.  Last BM was Monday  Has not tried to take any pain medications, did not have any tylenol/ibuprofen at home  CP is lower sternal radiating bilaterally, constant  Feels short of breath as well ROS: no fevers, +SOB, no dysuria, no n/v/d, +unintentional weight loss (195 to 182 over past 4-5 months)   Review of Systems   See HPI for ROS. All other systems reviewed and are negative.  Past medical history, surgical, family, and social history reviewed and updated in the EMR as appropriate.  Objective:  BP 136/73 mmHg  Pulse 110  Temp(Src) 98 F (36.7 C) (Oral)  Ht '6\' 3"'$  (1.905 m)  Wt 182 lb 2 oz (82.611 kg)  BMI 22.76 kg/m2  SpO2 99% Vitals and nursing note reviewed  General: sitting upright and able to ambulate, acts in worse pain laying down on exam table. CV: tachycardic, regular rhythm, normal s1s2 no murmurs appreciated. 2+ DP pulses Resp: CTAB, normal effort Abdomen: thin, +BS, TTP throughout abdomen, mild voluntary guarding Ext: no edema or cyanosis  Assessment & Plan:  See Problem List Documentation

## 2015-01-14 NOTE — ED Notes (Signed)
Pain that originates in the epigastric area, goes up chest and wraps around back. Feels like pancreatitis to him which he had last 7 months. Drank beers Saturday and Sunday.

## 2015-01-14 NOTE — Assessment & Plan Note (Signed)
Concern for acute on chronic pancreatitis. Most recent CT abdomen showed "Intramural pancreatic pseudocyst". No hematemesis. DDx would also include bowel obstruction. As he is in pain currently feel he warrants further evaluation through the ED rather than direct admission.  Discussed case with Dr. Ree Kida.

## 2015-01-14 NOTE — Assessment & Plan Note (Signed)
Atypical and more likely related to his abdominal pain/suspected pancreatitis. He does have a cardiac history and also warrants further investigation.

## 2015-01-15 ENCOUNTER — Other Ambulatory Visit: Payer: Self-pay

## 2015-01-15 DIAGNOSIS — K219 Gastro-esophageal reflux disease without esophagitis: Secondary | ICD-10-CM | POA: Diagnosis not present

## 2015-01-15 DIAGNOSIS — I4891 Unspecified atrial fibrillation: Secondary | ICD-10-CM

## 2015-01-15 DIAGNOSIS — E785 Hyperlipidemia, unspecified: Secondary | ICD-10-CM | POA: Diagnosis not present

## 2015-01-15 DIAGNOSIS — I4581 Long QT syndrome: Secondary | ICD-10-CM | POA: Diagnosis present

## 2015-01-15 DIAGNOSIS — F1010 Alcohol abuse, uncomplicated: Secondary | ICD-10-CM | POA: Diagnosis present

## 2015-01-15 DIAGNOSIS — R1013 Epigastric pain: Secondary | ICD-10-CM | POA: Diagnosis present

## 2015-01-15 DIAGNOSIS — E119 Type 2 diabetes mellitus without complications: Secondary | ICD-10-CM | POA: Diagnosis not present

## 2015-01-15 DIAGNOSIS — F17210 Nicotine dependence, cigarettes, uncomplicated: Secondary | ICD-10-CM | POA: Diagnosis present

## 2015-01-15 DIAGNOSIS — K859 Acute pancreatitis, unspecified: Secondary | ICD-10-CM | POA: Diagnosis not present

## 2015-01-15 DIAGNOSIS — K863 Pseudocyst of pancreas: Secondary | ICD-10-CM | POA: Diagnosis not present

## 2015-01-15 DIAGNOSIS — K852 Alcohol induced acute pancreatitis: Secondary | ICD-10-CM | POA: Diagnosis not present

## 2015-01-15 DIAGNOSIS — Z7901 Long term (current) use of anticoagulants: Secondary | ICD-10-CM | POA: Diagnosis not present

## 2015-01-15 DIAGNOSIS — I509 Heart failure, unspecified: Secondary | ICD-10-CM | POA: Diagnosis not present

## 2015-01-15 DIAGNOSIS — K860 Alcohol-induced chronic pancreatitis: Secondary | ICD-10-CM | POA: Diagnosis not present

## 2015-01-15 DIAGNOSIS — J449 Chronic obstructive pulmonary disease, unspecified: Secondary | ICD-10-CM | POA: Diagnosis present

## 2015-01-15 DIAGNOSIS — M1990 Unspecified osteoarthritis, unspecified site: Secondary | ICD-10-CM | POA: Diagnosis present

## 2015-01-15 DIAGNOSIS — Z79899 Other long term (current) drug therapy: Secondary | ICD-10-CM | POA: Diagnosis not present

## 2015-01-15 LAB — GLUCOSE, CAPILLARY
Glucose-Capillary: 86 mg/dL (ref 65–99)
Glucose-Capillary: 107 mg/dL — ABNORMAL HIGH (ref 65–99)
Glucose-Capillary: 98 mg/dL (ref 65–99)
Glucose-Capillary: 143 mg/dL — ABNORMAL HIGH (ref 65–99)
Glucose-Capillary: 72 mg/dL (ref 65–99)
Glucose-Capillary: 120 mg/dL — ABNORMAL HIGH (ref 65–99)

## 2015-01-15 LAB — COMPREHENSIVE METABOLIC PANEL
ALT: 13 U/L — ABNORMAL LOW (ref 17–63)
AST: 21 U/L (ref 15–41)
Albumin: 3.2 g/dL — ABNORMAL LOW (ref 3.5–5.0)
Alkaline Phosphatase: 50 U/L (ref 38–126)
Anion gap: 7 (ref 5–15)
BUN: <5 mg/dL — ABNORMAL LOW (ref 6–20)
CO2: 27 mmol/L (ref 22–32)
Calcium: 9 mg/dL (ref 8.9–10.3)
Chloride: 105 mmol/L (ref 101–111)
Creatinine, Ser: 0.69 mg/dL (ref 0.61–1.24)
GFR calc Af Amer: >60 mL/min (ref >60)
GFR calc non Af Amer: >60 mL/min (ref >60)
Glucose, Bld: 118 mg/dL — ABNORMAL HIGH (ref 65–99)
Potassium: 4 mmol/L (ref 3.5–5.1)
Sodium: 139 mmol/L (ref 135–145)
Total Bilirubin: 0.6 mg/dL (ref 0.3–1.2)
Total Protein: 6.5 g/dL (ref 6.5–8.1)

## 2015-01-15 LAB — CBC
HCT: 41.6 % (ref 39.0–52.0)
Hemoglobin: 13.9 g/dL (ref 13.0–17.0)
MCH: 31 pg (ref 26.0–34.0)
MCHC: 33.4 g/dL (ref 30.0–36.0)
MCV: 92.7 fL (ref 78.0–100.0)
Platelets: 227 10*3/uL (ref 150–400)
RBC: 4.49 MIL/uL (ref 4.22–5.81)
RDW: 14.5 % (ref 11.5–15.5)
WBC: 9.8 10*3/uL (ref 4.0–10.5)

## 2015-01-15 MED ORDER — TRAZODONE HCL 50 MG PO TABS
50.0000 mg | ORAL_TABLET | Freq: Every day | ORAL | Status: DC
  Administered 2015-01-15: 50 mg via ORAL
  Filled 2015-01-15: qty 1

## 2015-01-15 MED ORDER — HYDROMORPHONE HCL 1 MG/ML IJ SOLN
0.5000 mg | INTRAMUSCULAR | Status: DC | PRN
  Administered 2015-01-15 – 2015-01-16 (×5): 0.5 mg via INTRAVENOUS
  Filled 2015-01-15 (×5): qty 1

## 2015-01-15 NOTE — Progress Notes (Signed)
Family Medicine Teaching Service Daily Progress Note Intern Pager: [phone]  Patient name: Timothy Martinez Medical record number: [id number] Date of birth: [birth year] Age: 64 y.o. Gender: male  Primary Care Provider: Phill Myron, MD Consultants: none Code Status: FULL  Pt Overview and Major Events to Date:  06/22: Admitted  Assessment and Plan: EZEKIAL ARNS is a 64 y.o. male with a history of alcoholic pancreatitis presenting for abdominal pain and lipase elevation consistent with acute on chronic pancreatitis. PMH is also significant for COPD, DM, HLD, GERD.  Acute on chronic pancreatitis with pseudocyst noted Jan 2016: Lipase elevated at 175 after recent binge drinking. s/p banana bag. Tolerating clears. - telemetry (continues to be NSR, h/o AFib w/RVR with last flare) - NS '@100cc'$ /hr - dilaudid '1mg'$  q3hrs PRN ('5mg'$ /24h)> decreased to 0.'5mg'$  today - continue PPI (h/o GERD) - Defer further imaging at this time. No signs of SIRS. Would get cultures and start empiric abx (e.g. imipenem) if suspecting pseudocyst infection.  - Advance to fulls today.  Will decrease pain medication. - Counseled on continued ETOH cessation. - Bowel regimen  Atrial fibrillation: in nsr since cardioversion. Echo 12/30/2014 with EF 55-60%, G1DD, mod LVH. Followed by Dr. Acie Fredrickson. - continue eliquis - continue diltiazem '120mg'$  BID, metoprolol  T2DM: A1c 7.29 Jul 2014.  - Hold metformin - SSI and CBGs q4h while NPO - continue lipitor '40mg'$   QTc prolongation: Resolved. QTc 552mec on ECG in ED - Telemetry - Repeat EKG this am with normal QTc  History of alcohol abuse: Weekend drinking is most likely trigger for this. EtOH negative at admission.  - CIWA protocol - UDS - Trazodone added for sleep 6/23  FEN/GI: Banana bag x1L then NS @ 100cc/hr, Advance to full liquids Prophylaxis: Eliquis as above  Disposition: Discharge once improved.  Subjective:  Tolerated clears well.  Is ready to advance diet.   Has not had a BM.  Denies nausea, vomiting.  Objective: Temp:  [97.8 F (36.6 C)-98.2 F (36.8 C)] 97.8 F (36.6 C) (06/23 0520) Pulse Rate:  [62-110] 62 (06/23 0520) Resp:  [15-26] 15 (06/22 1155) BP: (109-136)/(66-90) 111/66 mmHg (06/23 0520) SpO2:  [91 %-99 %] 94 % (06/23 0520) Weight:  [182 lb 2 oz (82.611 kg)-186 lb 6.4 oz (84.55 kg)] 186 lb 6.4 oz (84.55 kg) (06/23 0520) Physical Exam: General: awake, alert, well appearing, NAD Cardiovascular: RRR, no murmurs, +2 radial pulses Respiratory: CTAB, no increased WOB Abdomen: soft, generalized TTP but more increased epigastric area, minimal voluntary guarding, no rebound, no peritoneal signs. Extremities: WWP, no edema  Laboratory:  Recent Labs Lab 01/14/15 0940 01/15/15 0512  WBC 11.1* 9.8  HGB 15.5 13.9  HCT 44.9 41.6  PLT 244 227    Recent Labs Lab 01/14/15 0940 01/15/15 0512  NA 137 139  K 4.0 4.0  CL 99* 105  CO2 27 27  BUN 8 <5*  CREATININE 0.81 0.69  CALCIUM 9.7 9.0  PROT 8.0 6.5  BILITOT 0.9 0.6  ALKPHOS 58 50  ALT 14* 13*  AST 22 21  GLUCOSE 141* 118*   Cardiac Panel (last 3 results)  Recent Labs  01/14/15 0940  TROPONINI <0.03   Lipase 175  Imaging/Diagnostic Tests: Dg Abd Acute W/chest  01/14/2015   CLINICAL DATA:  Acute on chronic epigastric pain.  EXAM: DG ABDOMEN ACUTE W/ 1V CHEST  COMPARISON:  August 07, 2014.  FINDINGS: There is no evidence of dilated bowel loops or free intraperitoneal air. Phleboliths are noted in the pelvis.  Heart size and mediastinal contours are within normal limits. No pneumothorax or pleural effusion is noted. Stable interstitial densities are noted throughout both lungs consistent with chronic interstitial lung disease or scarring.  IMPRESSION: No evidence of bowel obstruction or ileus. Stable interstitial densities noted throughout both lungs consistent with chronic interstitial lung disease or scarring.   Electronically Signed   By: Marijo Conception, M.D.   On:  01/14/2015 10:20   Janora Norlander, DO 01/15/2015, 7:11 AM PGY-1, [place] Intern pager: [phone], text pages welcome

## 2015-01-15 NOTE — Progress Notes (Signed)
Pt pain still present. Pt states pain is 10/10 before medication, and 4/10 after. Will continue to assess patient

## 2015-01-15 NOTE — Discharge Instructions (Addendum)
You were admitted for acute on chronic pancreatitis that was induced after consuming alcohol.  You should AVOID drinking alcohol.  Your diet was advanced slowly.  You should continue only eating what doesn't make your stomach hurt.  You should follow up with your PCP as scheduled for outpatient hospital follow up.   Information on my medicine - ELIQUIS (apixaban)  This medication education was reviewed with me or my healthcare representative as part of my discharge preparation.  The pharmacist that spoke with me during my hospital stay was:  Wayland Salinas, [facility]  Why was Eliquis prescribed for you? Eliquis was prescribed for you to reduce the risk of a blood clot forming that can cause a stroke if you have a medical condition called atrial fibrillation (a type of irregular heartbeat).  What do You need to know about Eliquis ? Take your Eliquis TWICE DAILY - one tablet in the morning and one tablet in the evening with or without food. If you have difficulty swallowing the tablet whole please discuss with your pharmacist how to take the medication safely.  Take Eliquis exactly as prescribed by your doctor and DO NOT stop taking Eliquis without talking to the doctor who prescribed the medication.  Stopping may increase your risk of developing a stroke.  Refill your prescription before you run out.  After discharge, you should have regular check-up appointments with your healthcare provider that is prescribing your Eliquis.  In the future your dose may need to be changed if your kidney function or weight changes by a significant amount or as you get older.  What do you do if you miss a dose? If you miss a dose, take it as soon as you remember on the same day and resume taking twice daily.  Do not take more than one dose of ELIQUIS at the same time to make up a missed dose.  Important Safety Information A possible side effect of Eliquis is bleeding. You should call your  healthcare provider right away if you experience any of the following: ? Bleeding from an injury or your nose that does not stop. ? Unusual colored urine (red or dark brown) or unusual colored stools (red or black). ? Unusual bruising for unknown reasons. ? A serious fall or if you hit your head (even if there is no bleeding).  Some medicines may interact with Eliquis and might increase your risk of bleeding or clotting while on Eliquis. To help avoid this, consult your healthcare provider or pharmacist prior to using any new prescription or non-prescription medications, including herbals, vitamins, non-steroidal anti-inflammatory drugs (NSAIDs) and supplements.  This website has more information on Eliquis (apixaban): http://www.eliquis.com/eliquis/home   Acute Pancreatitis Acute pancreatitis is a disease in which the pancreas becomes suddenly irritated (inflamed). The pancreas is a large gland behind your stomach. The pancreas makes enzymes that help digest food. The pancreas also makes 2 hormones that help control your blood sugar. Acute pancreatitis happens when the enzymes attack and damage the pancreas. Most attacks last a couple of days and can cause serious problems. HOME CARE  Follow your doctor's diet instructions. You may need to avoid alcohol and limit fat in your diet.  Eat small meals often.  Drink enough fluids to keep your pee (urine) clear or pale yellow.  Only take medicines as told by your doctor.  Avoid drinking alcohol if it caused your disease.  Do not smoke.  Get plenty of rest.  Check your blood sugar at home  as told by your doctor.  Keep all doctor visits as told. GET HELP IF:  You do not get better as quickly as expected.  You have new or worsening symptoms.  You have lasting pain, weakness, or feel sick to your stomach (nauseous).  You get better and then have another pain attack. GET HELP RIGHT AWAY IF:   You are unable to eat or keep fluids  down.  Your pain becomes severe.  You have a fever or lasting symptoms for more than 2 to 3 days.  You have a fever and your symptoms suddenly get worse.  Your skin or the white part of your eyes turn yellow (jaundice).  You throw up (vomit).  You feel dizzy, or you pass out (faint).  Your blood sugar is high (over 300 mg/dL). MAKE SURE YOU:   Understand these instructions.  Will watch your condition.  Will get help right away if you are not doing well or get worse. Document Released: 12/28/2007 Document Revised: 11/25/2013 Document Reviewed: 10/20/2011 [facility] Patient Information 2015 [place], [place]. This information is not intended to replace advice given to you by your health care provider. Make sure you discuss any questions you have with your health care provider.  Diabetes Mellitus and Food It is important for you to manage your blood sugar (glucose) level. Your blood glucose level can be greatly affected by what you eat. Eating healthier foods in the appropriate amounts throughout the day at about the same time each day will help you control your blood glucose level. It can also help slow or prevent worsening of your diabetes mellitus. Healthy eating may even help you improve the level of your blood pressure and reach or maintain a healthy weight.  HOW CAN FOOD AFFECT ME? Carbohydrates Carbohydrates affect your blood glucose level more than any other type of food. Your dietitian will help you determine how many carbohydrates to eat at each meal and teach you how to count carbohydrates. Counting carbohydrates is important to keep your blood glucose at a healthy level, especially if you are using insulin or taking certain medicines for diabetes mellitus. Alcohol Alcohol can cause sudden decreases in blood glucose (hypoglycemia), especially if you use insulin or take certain medicines for diabetes mellitus. Hypoglycemia can be a life-threatening condition. Symptoms of  hypoglycemia (sleepiness, dizziness, and disorientation) are similar to symptoms of having too much alcohol.  If your health care provider has given you approval to drink alcohol, do so in moderation and use the following guidelines:  Women should not have more than one drink per day, and men should not have more than two drinks per day. One drink is equal to:  12 oz of beer.  5 oz of wine.  1 oz of hard liquor.  Do not drink on an empty stomach.  Keep yourself hydrated. Have water, diet soda, or unsweetened iced tea.  Regular soda, juice, and other mixers might contain a lot of carbohydrates and should be counted. WHAT FOODS ARE NOT RECOMMENDED? As you make food choices, it is important to remember that all foods are not the same. Some foods have fewer nutrients per serving than other foods, even though they might have the same number of calories or carbohydrates. It is difficult to get your body what it needs when you eat foods with fewer nutrients. Examples of foods that you should avoid that are high in calories and carbohydrates but low in nutrients include:  Trans fats (most processed foods list trans fats  on the Nutrition Facts label).  Regular soda.  Juice.  Candy.  Sweets, such as cake, pie, doughnuts, and cookies.  Fried foods. WHAT FOODS CAN I EAT? Have nutrient-rich foods, which will nourish your body and keep you healthy. The food you should eat also will depend on several factors, including:  The calories you need.  The medicines you take.  Your weight.  Your blood glucose level.  Your blood pressure level.  Your cholesterol level. You also should eat a variety of foods, including:  Protein, such as meat, poultry, fish, tofu, nuts, and seeds (lean animal proteins are best).  Fruits.  Vegetables.  Dairy products, such as milk, cheese, and yogurt (low fat is best).  Breads, grains, pasta, cereal, rice, and beans.  Fats such as olive oil, trans  fat-free margarine, canola oil, avocado, and olives. DOES EVERYONE WITH DIABETES MELLITUS HAVE THE SAME MEAL PLAN? Because every person with diabetes mellitus is different, there is not one meal plan that works for everyone. It is very important that you meet with a dietitian who will help you create a meal plan that is just right for you. Document Released: 04/07/2005 Document Revised: 07/16/2013 Document Reviewed: 06/07/2013 [facility] Patient Information 2015 [place], [place]. This information is not intended to replace advice given to you by your health care provider. Make sure you discuss any questions you have with your health care provider.

## 2015-01-16 DIAGNOSIS — K860 Alcohol-induced chronic pancreatitis: Secondary | ICD-10-CM

## 2015-01-16 DIAGNOSIS — K859 Acute pancreatitis, unspecified: Secondary | ICD-10-CM

## 2015-01-16 DIAGNOSIS — E119 Type 2 diabetes mellitus without complications: Secondary | ICD-10-CM | POA: Diagnosis not present

## 2015-01-16 DIAGNOSIS — E785 Hyperlipidemia, unspecified: Secondary | ICD-10-CM | POA: Diagnosis not present

## 2015-01-16 LAB — BASIC METABOLIC PANEL
Anion gap: 7 (ref 5–15)
BUN: <5 mg/dL — ABNORMAL LOW (ref 6–20)
CO2: 27 mmol/L (ref 22–32)
Calcium: 8.8 mg/dL — ABNORMAL LOW (ref 8.9–10.3)
Chloride: 101 mmol/L (ref 101–111)
Creatinine, Ser: 0.79 mg/dL (ref 0.61–1.24)
GFR calc Af Amer: >60 mL/min (ref >60)
GFR calc non Af Amer: >60 mL/min (ref >60)
Glucose, Bld: 199 mg/dL — ABNORMAL HIGH (ref 65–99)
Potassium: 4.1 mmol/L (ref 3.5–5.1)
Sodium: 135 mmol/L (ref 135–145)

## 2015-01-16 LAB — GLUCOSE, CAPILLARY
Glucose-Capillary: 102 mg/dL — ABNORMAL HIGH (ref 65–99)
Glucose-Capillary: 103 mg/dL — ABNORMAL HIGH (ref 65–99)
Glucose-Capillary: 116 mg/dL — ABNORMAL HIGH (ref 65–99)
Glucose-Capillary: 95 mg/dL (ref 65–99)

## 2015-01-16 MED ORDER — OXYCODONE HCL 5 MG PO TABS
5.0000 mg | ORAL_TABLET | Freq: Four times a day (QID) | ORAL | Status: AC | PRN
  Administered 2015-01-16: 5 mg via ORAL
  Filled 2015-01-16: qty 1

## 2015-01-16 MED ORDER — HYDROCODONE-ACETAMINOPHEN 5-325 MG PO TABS: 1.0000 | ORAL_TABLET | Freq: Four times a day (QID) | ORAL | Status: DC | PRN

## 2015-01-16 MED ORDER — SENNA 8.6 MG PO TABS
1.0000 | ORAL_TABLET | Freq: Once | ORAL | Status: AC
  Administered 2015-01-16: 8.6 mg via ORAL
  Filled 2015-01-16: qty 1

## 2015-01-16 NOTE — Care Management Note (Signed)
Case Management Note  Patient Details  Name: ANGELLO CHIEN MRN: [id number] Date of Birth: [birth year]  Subjective/Objective:   Pt admitted for abdominal pain.                  Action/Plan: No needs identified by CM at this time.   Expected Discharge Date:                  Expected Discharge Plan:  Home/Self Care  In-House Referral:     Discharge planning Services  CM Consult  Post Acute Care Choice:    Choice offered to:     DME Arranged:    DME Agency:     HH Arranged:    [place] Agency:     Status of Service:  Completed, signed off  Medicare Important Message Given:  Yes Date Medicare IM Given:  01/16/15 Medicare IM give by:  Jacqlyn Krauss, RN,BSN  Date Additional Medicare IM Given:    Additional Medicare Important Message give by:     If discussed at [facility] of Stay Meetings, dates discussed:    Additional Comments:  Bethena Roys, RN 01/16/2015, 4:08 PM

## 2015-01-16 NOTE — Progress Notes (Signed)
Patient stated that his MD had told him to quit taking his po cardizem,  I spoke to Dr. Lajuana Ripple about this as he has been receiving this during the admission and it is on his AVS.  She discontinued this from his AVS.  This change was reviewed with patient.

## 2015-01-16 NOTE — Discharge Summary (Signed)
Timothy Martinez  Patient name: Timothy Martinez Medical record number: [id number] Date of birth: Aug 04, 1950 Age: 64 y.o. Gender: male Date of Admission: 01/14/2015  Date of Discharge: 01/16/15 Admitting Physician: Dickie La, MD  Primary Care Provider: Phill Myron, MD Consultants: none  Indication for Hospitalization: abdominal pain  Discharge Diagnoses/Problem List:  Acute on chronic pancreatitis Alcohol induced pancreatitis A-fib with RVR T2DM HLD GERD Chronic obstructive lung disease  Disposition: Discharge home  Discharge Condition: Stable  Discharge Exam:  Blood pressure 125/62, pulse 63, temperature 97.8 F (36.6 C), temperature source Oral, resp. rate 18, height _0  (1.905 m), weight 182 lb 1.6 oz (82.6 kg), SpO2 96 %. General: awake, alert, well appearing, NAD HEENT: [place]/AT, EOMI, MMM Cardiovascular: RRR, no murmurs, +2 radial pulses Respiratory: CTAB, no increased WOB Abdomen: soft, generalized TTP, minimal voluntary guarding, no rebound, no peritoneal signs, +BS. Extremities: WWP, no edema Neuro: no focal deficits, follows commands Psych: mood stable, pleasant, speech normal  Brief Hospital Course:  [name] is a 64 y.o. male with a history of alcoholic pancreatitis that presented for abdominal pain and lipase elevation consistent with acute on chronic pancreatitis. PMH is also significant for COPD, DM, HLD, GERD.  In the ED, lipase elevated at 175.  On exam, MM were tacky, abdomen was soft but TTP with light palpation- worst at the epigastrium, BS were hyperactive.  He was admitted to the [facility] for further management.  Patient was initially hydrated with a banana bag then placed on MIVFs.  He was placed on a clear liquid diet, which was advanced as tolerated.  His pain was treated initially with IV pain medications which were transitioned to oral pain medications as diet was advanced.  An abdominal xray was obtained  which showed no evidence of obstruction.  Patient was monitored for fevers/ evidence of infection in the setting of known pseudocyst.  Alcohol cessation counseling also performed with patient.  Patient was also monitored on telemetry.  He was continued on his home medications for a-fib.  He remained in NSR during hospitalization.    Patient also monitored per CIWA protocol in the setting of known ETOH abuse and recent binge drinking.  CIWA scores remained 0's throughout the duration of hospitalization.  Patient was discharged in stable condition.  Discharge instructions and return precautions were reviewed with patient, who voiced good understanding.  Patient scheduled for close follow up with Family Medicine.  Issues for Follow Up:  1. ETOH cessation 2. Taking Cardizem previously, supposedly told by a provider to stop taking medication.  Last Cards note states likely will discontinue after ablation but could not find a record of patient having already had ablation performed.    Significant Procedures: none  Significant Labs and Imaging:   Recent Labs Lab 01/14/15 0940 01/15/15 0512  WBC 11.1* 9.8  HGB 15.5 13.9  HCT 44.9 41.6  PLT 244 227    Recent Labs Lab 01/14/15 0940 01/15/15 0512 01/16/15 0820  NA 137 139 135  K 4.0 4.0 4.1  CL 99* 105 101  CO2 _1 GLUCOSE 141* 118* 199*  BUN 8 <5* <5*  CREATININE 0.81 0.69 0.79  CALCIUM 9.7 9.0 8.8*  ALKPHOS 58 50  --   AST 22 21  --   ALT 14* 13*  --   ALBUMIN 4.1 3.2*  --    Lipase     Component Value Date/Time   LIPASE 175* 01/14/2015 0940  Dg Abd Acute W/chest  01/14/2015   CLINICAL DATA:  Acute on chronic epigastric pain.  EXAM: DG ABDOMEN ACUTE W/ 1V CHEST  COMPARISON:  August 07, 2014.  FINDINGS: There is no evidence of dilated bowel loops or free intraperitoneal air. Phleboliths are noted in the pelvis. Heart size and mediastinal contours are within normal limits. No pneumothorax or pleural effusion is noted.  Stable interstitial densities are noted throughout both lungs consistent with chronic interstitial lung disease or scarring.  IMPRESSION: No evidence of bowel obstruction or ileus. Stable interstitial densities noted throughout both lungs consistent with chronic interstitial lung disease or scarring.   Electronically Signed   By: Marijo Conception, M.D.   On: 01/14/2015 10:20   Results/Tests Pending at Time of Discharge: none  Discharge Medications:    Medication List    STOP taking these medications        diltiazem 120 MG 12 hr capsule  Commonly known as:  CARDIZEM SR      TAKE these medications        albuterol 108 (90 BASE) MCG/ACT inhaler  Commonly known as:  PROVENTIL HFA;VENTOLIN HFA  Inhale 1-2 puffs into the lungs every 12 (twelve) hours as needed for wheezing or shortness of breath.     apixaban 5 MG Tabs tablet  Commonly known as:  ELIQUIS  Take 1 tablet (5 mg total) by mouth 2 (two) times daily.  Notes to Patient:  TAKE ONE DOSE TONIGHT      atorvastatin 40 MG tablet  Commonly known as:  LIPITOR  Take 1 tablet (40 mg total) by mouth daily at 6 PM.  Notes to Patient:  TAKE TONIGHT     famotidine 40 MG tablet  Commonly known as:  PEPCID  TAKE 1 TABLET (40 MG TOTAL) BY MOUTH DAILY.     glucose blood test strip  Commonly known as:  ONE TOUCH ULTRA TEST  Use check fasting blood glucose qAM. Type 2 Diabetes E11.9     metFORMIN 500 MG tablet  Commonly known as:  GLUCOPHAGE  TAKE 2 TABLETS (1,000 MG TOTAL) BY MOUTH 2 (TWO) TIMES DAILY WITH A MEAL.  Notes to Patient:  TAKE ONE DOSE AT DINNER      metoprolol 50 MG tablet  Commonly known as:  LOPRESSOR  TAKE 1 TABLET (50 MG TOTAL) BY MOUTH 2 (TWO) TIMES DAILY.  Notes to Patient:  TAKE ONE DOSE TONIGHT     omeprazole 20 MG capsule  Commonly known as:  PRILOSEC  TAKE 1 CAPSULE (20 MG TOTAL) BY MOUTH DAILY.     ONE TOUCH LANCETS Misc  Check fasting blood glucose qAM. Type 2 DM. E11.9     ONE TOUCH ULTRA 2 W/DEVICE  Kit  Dispense one glucometer. Diagnosis Type 2 Diabetes. E11.9        Discharge Instructions: Please refer to Patient Instructions section of EMR for full details.  Patient was counseled important signs and symptoms that should prompt return to medical care, changes in medications, dietary instructions, activity restrictions, and follow up appointments.   Follow-Up Appointments: Follow-up Information    Follow up with Phill Myron, MD. Go on 01/20/2015.   Specialty:  Family Medicine   Why:  2:30pm hospital follow up   Contact information:   [place] [id number] [phone]       Janora Norlander, DO 01/18/2015, 11:23 AM PGY-1, [place]

## 2015-01-16 NOTE — Progress Notes (Signed)
Family Medicine Teaching Service Daily Progress Note Intern Pager: [phone]  Patient name: Timothy Martinez Medical record number: [id number] Date of birth: 26-Oct-1950 Age: 64 y.o. Gender: male  Primary Care Provider: Phill Myron, MD Consultants: none Code Status: FULL  Pt Overview and Major Events to Date:  06/22: Admitted  Assessment and Plan: Timothy Martinez is a 64 y.o. male with a history of alcoholic pancreatitis presenting for abdominal pain and lipase elevation consistent with acute on chronic pancreatitis. PMH is also significant for COPD, DM, HLD, GERD.  Acute on chronic pancreatitis with pseudocyst noted Jan 2016: Lipase elevated at 175 after recent binge drinking. s/p banana bag. Tolerating clears. - telemetry (continues to be NSR, h/o AFib w/RVR with last flare) - NS '@100cc'$ /hr - Continue to decrease pain regimen as diet advances.  Transition to oral Oxy 5 mg to end 1600. - continue PPI (h/o GERD) - Defer further imaging at this time. No signs of SIRS. Would get cultures and start empiric abx (e.g. imipenem) if suspecting pseudocyst infection.  - Advance to soft diet today. Anticipate can advance to regular this evening and discontinue pain medications. - Bowel regimen. Senna added today.  Atrial fibrillation: in nsr since cardioversion. Echo 12/30/2014 with EF 55-60%, G1DD, mod LVH. Followed by Dr. Acie Fredrickson. - continue eliquis - continue diltiazem '120mg'$  BID, metoprolol  T2DM: A1c 7.29 Jul 2014. BG 70-140's - Hold metformin - SSI and CBGs q4h while NPO - continue lipitor '40mg'$   QTc prolongation: Resolved. QTc 541mec on ECG in ED. Repeat EKG this am with normal QTc - Telemetry  History of alcohol abuse: Weekend drinking is most likely trigger for this. EtOH negative at admission.  - CIWA protocol: 0's  - UDS - Trazodone dc'd  FEN/GI: NS @ 100cc/hr, Advance to soft diet Prophylaxis: Eliquis as above  Disposition: Discharge once improved.  Subjective:  Tolerated  full liquids well.  Is ready to advance diet.  Has not had a BM.  Reports trazodone gave him nightmares.  Denies nausea, vomiting.  Objective: Temp:  [97.8 F (36.6 C)-98.2 F (36.8 C)] 98.2 F (36.8 C) (06/24 0436) Pulse Rate:  [58-74] 58 (06/24 0436) Resp:  [15] 15 (06/23 1341) BP: (109-125)/(61-70) 113/61 mmHg (06/24 0436) SpO2:  [95 %-98 %] 96 % (06/24 0436) Weight:  [182 lb 1.6 oz (82.6 kg)] 182 lb 1.6 oz (82.6 kg) (06/24 0436) Physical Exam: General: awake, alert, well appearing, NAD Cardiovascular: RRR, no murmurs, +2 radial pulses Respiratory: CTAB, no increased WOB Abdomen: soft, generalized TTP, minimal voluntary guarding, no rebound, no peritoneal signs, +BS. Extremities: WWP, no edema  Laboratory:  Recent Labs Lab 01/14/15 0940 01/15/15 0512  WBC 11.1* 9.8  HGB 15.5 13.9  HCT 44.9 41.6  PLT 244 227    Recent Labs Lab 01/14/15 0940 01/15/15 0512  NA 137 139  K 4.0 4.0  CL 99* 105  CO2 27 27  BUN 8 <5*  CREATININE 0.81 0.69  CALCIUM 9.7 9.0  PROT 8.0 6.5  BILITOT 0.9 0.6  ALKPHOS 58 50  ALT 14* 13*  AST 22 21  GLUCOSE 141* 118*   Cardiac Panel (last 3 results)  Recent Labs  01/14/15 0940  TROPONINI <0.03   Lipase 175  Imaging/Diagnostic Tests: Dg Abd Acute W/chest  01/14/2015   CLINICAL DATA:  Acute on chronic epigastric pain.  EXAM: DG ABDOMEN ACUTE W/ 1V CHEST  COMPARISON:  August 07, 2014.  FINDINGS: There is no evidence of dilated bowel loops or free intraperitoneal air.  Phleboliths are noted in the pelvis. Heart size and mediastinal contours are within normal limits. No pneumothorax or pleural effusion is noted. Stable interstitial densities are noted throughout both lungs consistent with chronic interstitial lung disease or scarring.  IMPRESSION: No evidence of bowel obstruction or ileus. Stable interstitial densities noted throughout both lungs consistent with chronic interstitial lung disease or scarring.   Electronically Signed   By:  Marijo Conception, M.D.   On: 01/14/2015 10:20   Janora Norlander, DO 01/16/2015, 6:56 AM PGY-1, [place] Intern pager: [phone], text pages welcome

## 2015-01-20 ENCOUNTER — Encounter: Payer: Self-pay | Admitting: Family Medicine

## 2015-01-20 ENCOUNTER — Ambulatory Visit (INDEPENDENT_AMBULATORY_CARE_PROVIDER_SITE_OTHER): Payer: Medicare Other | Admitting: Family Medicine

## 2015-01-20 VITALS — BP 130/77 | HR 102 | Temp 98.3°F | Ht 75.0 in | Wt 182.0 lb

## 2015-01-20 DIAGNOSIS — F419 Anxiety disorder, unspecified: Secondary | ICD-10-CM

## 2015-01-20 DIAGNOSIS — F32A Depression, unspecified: Secondary | ICD-10-CM

## 2015-01-20 DIAGNOSIS — K860 Alcohol-induced chronic pancreatitis: Secondary | ICD-10-CM | POA: Diagnosis present

## 2015-01-20 DIAGNOSIS — F329 Major depressive disorder, single episode, unspecified: Secondary | ICD-10-CM

## 2015-01-20 DIAGNOSIS — Z720 Tobacco use: Secondary | ICD-10-CM

## 2015-01-20 DIAGNOSIS — I4892 Unspecified atrial flutter: Secondary | ICD-10-CM | POA: Diagnosis not present

## 2015-01-20 DIAGNOSIS — F418 Other specified anxiety disorders: Secondary | ICD-10-CM

## 2015-01-20 DIAGNOSIS — E119 Type 2 diabetes mellitus without complications: Secondary | ICD-10-CM | POA: Diagnosis not present

## 2015-01-20 HISTORY — DX: Anxiety disorder, unspecified: F41.9

## 2015-01-20 HISTORY — DX: Depression, unspecified: F32.A

## 2015-01-20 MED ORDER — CITALOPRAM HYDROBROMIDE 20 MG PO TABS: 20.0000 mg | ORAL_TABLET | Freq: Every day | ORAL | Status: DC

## 2015-01-20 MED ORDER — METFORMIN HCL 500 MG PO TABS: ORAL_TABLET | ORAL | Status: DC

## 2015-01-20 NOTE — Assessment & Plan Note (Signed)
RRR today - advised calling his cardiologist to see if he should be taking Cardizem  - Continue metoprolol unless advised otherwise by cardiology

## 2015-01-20 NOTE — Assessment & Plan Note (Signed)
Continue to smoke ~ 1 ppd

## 2015-01-20 NOTE — Assessment & Plan Note (Signed)
Still having some pain, but improving. Appetite improving.  - Discontinue PPI and H2 blocker at patient request. Denies GER symptoms or Hx of GI bleed

## 2015-01-20 NOTE — Patient Instructions (Addendum)
It was great seeing you today.   Contact your heart doctor about which heart medications you should be on now Start Celexa '20mg'$  daily. Take every morning.   Taking the medicine as directed and not missing any doses is one of the best things you can do to treat your anxiety/depression.  Here are some things to keep in mind: Side effects (stomach upset, some increased anxiety) may happen before you notice a benefit.  These side effects typically go away over time. Changes to your dose of medicine or a change in medication all together is sometimes necessary Many people will notice an improvement within two weeks but the full effect of the medication can take up to 4-6 weeks Stopping the medication when you start feeling better often results in a return of symptoms. Most people need to be on medication at least 6-12 months 1. If you start having thoughts of hurting yourself or others after starting this medicine, please call me immediately.     Please bring all your medications to every doctors visit  Sign up for My Chart to have easy access to your labs results, and communication with your Primary care physician.  Next Appointment  Please make an appointment with Dr Berkley Harvey in next month to discuss diabetes   I look forward to talking with you again at our next visit. If you have any questions or concerns before then, please call the clinic at [phone].  Take Care,   Dr Phill Myron

## 2015-01-20 NOTE — Progress Notes (Signed)
  Patient name: Timothy Martinez MRN [id number]  Date of birth: Aug 23, 1950  CC & HPI:  Timothy Martinez is a 64 y.o. male presenting today for hospital follow-up for pancreatitis due to alcohol use. Reports not drinking since leaving the hospital. Endorses anxiety and worrying about other peoples problems as the cause for his drinking. Denies SI/HI.  Continues to endorse 4-5/10 epigastric pain that has improved some since discharge.   Afib - Reports Cardizem was stopped at discharge, but he is unsure if he should be on this medication.  - He denies palpitations since ablation - Denies CP  ROS: See HPI  Medications & Allergies: Reviewed  Social History: Reviewed:   Objective Findings:  Vitals: BP 130/77 mmHg  Pulse 102  Temp(Src) 98.3 F (36.8 C) (Oral)  Ht '6\' 3"'$  (1.905 m)  Wt 182 lb (82.555 kg)  BMI 22.75 kg/m2  Gen: NAD CV: RRR w/o m/r/g, pulses +2 b/l Resp: CTAB w/ normal respiratory effort  Assessment & Plan:   Please See Problem Focused Assessment & Plan

## 2015-01-20 NOTE — Assessment & Plan Note (Signed)
Reports anxiety / Depression as cause for his drinking - never been on SSRI before - Start Celexa '20mg'$  qam

## 2015-01-22 ENCOUNTER — Encounter: Payer: Self-pay | Admitting: Cardiovascular Disease

## 2015-01-25 ENCOUNTER — Other Ambulatory Visit: Payer: Self-pay | Admitting: Family Medicine

## 2015-01-27 NOTE — Telephone Encounter (Signed)
Pt returned call and informed me that he was prescribed a medication last Tuesday and had experienced some side effects (diarrhea, some pain) he stated that he only took 2 pills and stopped taking them and wanted to let the doctor know, pt stated that he has a appointment with Berkley Harvey on the 22nd and that he would discuss this but that he was feeling pretty good right now,  also in reference to below he stated that Dr. Berkley Harvey took him off of this medicine and stated that he has had heartburn 2 times since getting out of hospital and that if we wanted to call it in to the pharmacy we could.  Told him I would send this to the PCP and wait for him to advise since he had taken him off of it. Katharina Caper, Earvin Blazier D, [place]

## 2015-01-27 NOTE — Telephone Encounter (Signed)
Left VM for patient. If he calls back please have him speak with a nurse/CMA and ask if he is still taking famotidine. I received a refill request. Does he still need this medication or not.   If any questions then please take the best time and phone number to call and I will try to call him back.   Rosemarie Ax, MD PGY-3, [facility] 01/27/2015, 11:26 AM

## 2015-02-12 ENCOUNTER — Other Ambulatory Visit: Payer: Self-pay | Admitting: Family Medicine

## 2015-02-12 DIAGNOSIS — E119 Type 2 diabetes mellitus without complications: Secondary | ICD-10-CM

## 2015-02-12 MED ORDER — METFORMIN HCL 500 MG PO TABS: ORAL_TABLET | ORAL | Status: DC

## 2015-02-13 ENCOUNTER — Telehealth: Payer: Self-pay | Admitting: Cardiovascular Disease

## 2015-02-13 ENCOUNTER — Encounter: Payer: Self-pay | Admitting: Family Medicine

## 2015-02-13 ENCOUNTER — Other Ambulatory Visit: Payer: Self-pay | Admitting: Family Medicine

## 2015-02-13 ENCOUNTER — Ambulatory Visit (INDEPENDENT_AMBULATORY_CARE_PROVIDER_SITE_OTHER): Payer: Medicare Other | Admitting: Family Medicine

## 2015-02-13 VITALS — BP 115/67 | HR 72 | Temp 98.0°F | Ht 75.0 in | Wt 179.1 lb

## 2015-02-13 DIAGNOSIS — R3129 Other microscopic hematuria: Secondary | ICD-10-CM | POA: Insufficient documentation

## 2015-02-13 DIAGNOSIS — I4891 Unspecified atrial fibrillation: Secondary | ICD-10-CM

## 2015-02-13 DIAGNOSIS — F32A Depression, unspecified: Secondary | ICD-10-CM

## 2015-02-13 DIAGNOSIS — E119 Type 2 diabetes mellitus without complications: Secondary | ICD-10-CM | POA: Diagnosis not present

## 2015-02-13 DIAGNOSIS — F418 Other specified anxiety disorders: Secondary | ICD-10-CM | POA: Diagnosis not present

## 2015-02-13 DIAGNOSIS — F329 Major depressive disorder, single episode, unspecified: Secondary | ICD-10-CM

## 2015-02-13 DIAGNOSIS — F419 Anxiety disorder, unspecified: Secondary | ICD-10-CM

## 2015-02-13 DIAGNOSIS — F1010 Alcohol abuse, uncomplicated: Secondary | ICD-10-CM | POA: Diagnosis not present

## 2015-02-13 HISTORY — DX: Other microscopic hematuria: R31.29

## 2015-02-13 LAB — POCT URINALYSIS DIPSTICK
Bilirubin, UA: NEGATIVE
Glucose, UA: NEGATIVE
Ketones, UA: NEGATIVE
Leukocytes, UA: NEGATIVE
Nitrite, UA: NEGATIVE
Protein, UA: NEGATIVE
Spec Grav, UA: 1.01
Urobilinogen, UA: 0.2
pH, UA: 5.5

## 2015-02-13 LAB — POCT GLYCOSYLATED HEMOGLOBIN (HGB A1C): Hemoglobin A1C: 6.3

## 2015-02-13 LAB — POCT UA - MICROSCOPIC ONLY

## 2015-02-13 MED ORDER — FLUOXETINE HCL 20 MG PO TABS: 20.0000 mg | ORAL_TABLET | Freq: Every day | ORAL | Status: DC

## 2015-02-13 NOTE — Assessment & Plan Note (Signed)
Refilled Metoprolol as BP and pulse are well controlled - He will discuss with his cardiologist if he still needs this since s/p ablation since he reports it was stopped after his last hospitalization

## 2015-02-13 NOTE — Assessment & Plan Note (Signed)
Discontinue Celexa due to diarrhea - Start Prozac '10mg'$  qd x 1 week, then increase to '20mg'$  qd

## 2015-02-13 NOTE — Telephone Encounter (Signed)
**Note De-Identified Timothy Martinez Obfuscation** The pt states that he had an OV with his PCP this am and was advised while there that he should call Dr Elmarie Shiley office to find out if he is suppose to be taking Metoprolol 50 mg bid. The pt is advised according to the OV notes from Dr Acie Fredrickson at his last OV on 10/20/14 that he is suppose to be taking. He states that he is taking and has been taking just wanted to be sure he was taking correct medications. I went over his med list with him and he is taking his medications as directed.

## 2015-02-13 NOTE — Assessment & Plan Note (Addendum)
A1c well controlled now - continue metformin - referred to opthalmology - check UA and microalbumin

## 2015-02-13 NOTE — Patient Instructions (Addendum)
It was great seeing you today.   1. Start taking with multivitamin with folate and B12.  2. I have referred you to the eye doctor for DM eye.  3. Call to schedule your colonoscopy.  4. Start Prozac'10mg'$  (1/2 pill) daily for 1 week, then increase to '20mg'$  (1 pill) daily  5. Talk to your cardiologist about your Metoprolol   Please bring all your medications to every doctors visit  Sign up for My Chart to have easy access to your labs results, and communication with your Primary care physician.  Next Appointment  Please make an appointment with Dr Berkley Harvey in 1 month   I look forward to talking with you again at our next visit. If you have any questions or concerns before then, please call the clinic at [phone].  Take Care,   Dr Phill Myron

## 2015-02-13 NOTE — Assessment & Plan Note (Signed)
Denies alcohol use since last hospitalization - Recommend Multivitamin with folate and B12

## 2015-02-13 NOTE — Telephone Encounter (Signed)
Walk in pt form-pt needs call ASAP- took to Message nurse room

## 2015-02-13 NOTE — Progress Notes (Signed)
  Patient name: Timothy Martinez MRN [id number]  Date of birth: [birth year]  CC & HPI:  Timothy Martinez is a 64 y.o. male presenting today for Diabetes, HTN and Mood.   DIABETES   Blood Sugar Ranges: not checking  Symptoms of Hypoglycemia? no  Comorbid Symptoms: no Chest pain; no SOB; no Neuropathy:  Vision problems - difficulty seeing without glasses  Medication Compliance: yes   Medication Side Effects: denies   Preventative Health Care  Foot Exam: done today  CHRONIC HYPERTENSION  BP Readings from Last 3 Encounters:  02/13/15 115/67  01/20/15 130/77  01/16/15 125/62    Disease Monitoring  Chest pain: no   Dyspnea: no   Claudication: no  Medication compliance: yes  Medication Side Effects: denies Dizziness/lightheadedness;    Preventitive Healthcare:   History  Smoking status  . Current Every Day Smoker -- 15.00 packs/day for 50 years  . Types: Cigarettes  . Start date: 04/09/1964  Smokeless tobacco  . Never Used    Comment: trying to quit. smoked 3 ppd before   Mood - Reports uncontrollable diarrhea after starting Celexa so stopped after 2 days and diarrhea resolved.   ROS: See HPI   Medications & Allergies: Reviewed  Social History: Reviewed:   Objective Findings:  Vitals: BP 115/67 mmHg  Pulse 72  Temp(Src) 98 F (36.7 C) (Oral)  Ht '6\' 3"'$  (1.905 m)  Wt 179 lb 2 oz (81.251 kg)  BMI 22.39 kg/m2  Gen: NAD CV: RRR w/o m/r/g, pulses +2 b/l Resp: CTAB w/ normal respiratory effort Foot Exam: PT Pulses 2+; No Ulcers, bruises or cuts; Monofilament testing: Sensation intact on right, No sensation on left foot from ankle down (chronic since back surgery)    Assessment & Plan:   Please See Problem Focused Assessment & Plan

## 2015-02-14 LAB — MICROALBUMIN, URINE: Microalb, Ur: 0.2 mg/dL (ref <2.0)

## 2015-02-16 ENCOUNTER — Other Ambulatory Visit: Payer: Self-pay | Admitting: Family Medicine

## 2015-02-16 DIAGNOSIS — I4891 Unspecified atrial fibrillation: Secondary | ICD-10-CM

## 2015-02-16 MED ORDER — FAMOTIDINE 40 MG PO TABS: ORAL_TABLET | ORAL | Status: DC

## 2015-02-16 MED ORDER — METOPROLOL TARTRATE 50 MG PO TABS: ORAL_TABLET | ORAL | Status: DC

## 2015-02-16 NOTE — Telephone Encounter (Signed)
Timothy Martinez is calling because he states that his heart care provider is suggesting that he continue Metoprolol and he needs a refill on it. Additionally he would like a refill on famotidine for his acid reflux. Please contact the patient and let him know when these are complete. Thank you, Fonda Kinder, ASA

## 2015-02-22 ENCOUNTER — Other Ambulatory Visit: Payer: Self-pay | Admitting: Cardiovascular Disease

## 2015-02-23 ENCOUNTER — Other Ambulatory Visit: Payer: Self-pay

## 2015-02-23 DIAGNOSIS — I4892 Unspecified atrial flutter: Secondary | ICD-10-CM

## 2015-02-23 DIAGNOSIS — I4891 Unspecified atrial fibrillation: Secondary | ICD-10-CM

## 2015-02-23 MED ORDER — APIXABAN 5 MG PO TABS: 5.0000 mg | ORAL_TABLET | Freq: Two times a day (BID) | ORAL | Status: DC

## 2015-02-24 ENCOUNTER — Other Ambulatory Visit: Payer: Self-pay | Admitting: Cardiovascular Disease

## 2015-03-16 ENCOUNTER — Encounter: Payer: Self-pay | Admitting: Family Medicine

## 2015-03-16 ENCOUNTER — Ambulatory Visit (INDEPENDENT_AMBULATORY_CARE_PROVIDER_SITE_OTHER): Payer: Medicare Other | Admitting: Family Medicine

## 2015-03-16 VITALS — BP 124/70 | HR 71 | Temp 98.0°F | Ht 75.0 in | Wt 180.2 lb

## 2015-03-16 DIAGNOSIS — R312 Other microscopic hematuria: Secondary | ICD-10-CM | POA: Diagnosis not present

## 2015-03-16 DIAGNOSIS — I739 Peripheral vascular disease, unspecified: Secondary | ICD-10-CM

## 2015-03-16 DIAGNOSIS — G629 Polyneuropathy, unspecified: Secondary | ICD-10-CM

## 2015-03-16 DIAGNOSIS — Z720 Tobacco use: Secondary | ICD-10-CM

## 2015-03-16 DIAGNOSIS — F418 Other specified anxiety disorders: Secondary | ICD-10-CM

## 2015-03-16 DIAGNOSIS — F419 Anxiety disorder, unspecified: Secondary | ICD-10-CM

## 2015-03-16 DIAGNOSIS — F329 Major depressive disorder, single episode, unspecified: Secondary | ICD-10-CM

## 2015-03-16 DIAGNOSIS — F32A Depression, unspecified: Secondary | ICD-10-CM

## 2015-03-16 DIAGNOSIS — R3129 Other microscopic hematuria: Secondary | ICD-10-CM

## 2015-03-16 HISTORY — DX: Polyneuropathy, unspecified: G62.9

## 2015-03-16 MED ORDER — GABAPENTIN 100 MG PO CAPS: 100.0000 mg | ORAL_CAPSULE | Freq: Three times a day (TID) | ORAL | Status: DC | PRN

## 2015-03-16 NOTE — Assessment & Plan Note (Addendum)
Left foot neuropathy with multiple risk factors: Chronic alcoholism; diabetes.  Also with left calf claudication.  - Bilateral Arterial Dopplers ordered - Gabapentin 100 mg 3 times a day when necessary;  Advised increasing by 100 mg  every 3 days, not to exceed 300 mg 3 times a day.  - Advised smoking cessation - f/u in 2 weeks

## 2015-03-16 NOTE — Patient Instructions (Addendum)
It was great seeing you today.   1. Take gabapentin 100 mg up to 3 times a day for left foot pain.  Start with taking this at night as this medication can make you sleepy and drowsy.  Do not drive on taking his medication.  He can increase by 1 pill every 3 days.  Do not go past 300 mg (3 pills) 3 times a day.  2. You're foot and leg pain is most likely caused by artery damage from smoking.  Quitting smoking is the best he can do to prevent further damage.  3. I have ordered ultrasounds of her legs to evaluate for artery damage 4. You can make appointment with Dr. Valentina Lucks to discuss smoking cessation to help with this   Please bring all your medications to every doctors visit  Sign up for My Chart to have easy access to your labs results, and communication with your Primary care physician.  Next Appointment  Please make an appointment with Dr Berkley Harvey in 2 weeks   I look forward to talking with you again at our next visit. If you have any questions or concerns before then, please call the clinic at [phone].  Take Care,   Dr Phill Myron

## 2015-03-16 NOTE — Progress Notes (Signed)
  Patient name: Timothy Martinez MRN [id number]  Date of birth: Jan 23, 1951  CC & HPI:  Timothy Martinez is a 64 y.o. male presenting today for left foot pain.  He reports sharp stabbing pain radiating from his heel to his toes bilaterally; also associated with feeling numb / "dead".  Pain is worse at night and prevents him from sleeping.  Has been occurring over the last 2 weeks.  Denies any trauma or injury.  He has not had any alcohol since his last hospitalization; has been taking B-12 daily.  Continues to smoke; endorses left calf cramping with walking.  This has been occurring for the past year, but is getting worse.  He was previously able to walk a mile without symptoms but is now only able to walk 1 block.  No history of heart attack or stroke.  His diabetes is under well control.   ROS: See HPI   Medical & Surgical Hx:  Reviewed  Medications & Allergies: Reviewed  Social History: Reviewed:   Objective Findings:  Vitals: BP 124/70 mmHg  Pulse 71  Temp(Src) 98 F (36.7 C) (Oral)  Ht '6\' 3"'$  (1.905 m)  Wt 180 lb 3 oz (81.733 kg)  BMI 22.52 kg/m2  Gen: NAD CV: RRR w/o m/r/g, pulses +2 b/l Resp: CTAB w/ normal respiratory effort Lower Ext: Loss of hair on lower leg; No edema; DP pulses intact b/l; Calves nontender   Assessment & Plan:   Please See Problem Focused Assessment & Plan

## 2015-03-20 ENCOUNTER — Ambulatory Visit (HOSPITAL_COMMUNITY)
Admission: RE | Admit: 2015-03-20 | Discharge: 2015-03-20 | Disposition: A | Discharge status: Home / Self Care | Payer: Medicare Other | Source: Ambulatory Visit | Attending: Family Medicine | Admitting: Family Medicine

## 2015-03-20 DIAGNOSIS — R202 Paresthesia of skin: Secondary | ICD-10-CM | POA: Diagnosis not present

## 2015-03-20 DIAGNOSIS — I739 Peripheral vascular disease, unspecified: Secondary | ICD-10-CM | POA: Diagnosis not present

## 2015-03-20 DIAGNOSIS — R200 Anesthesia of skin: Secondary | ICD-10-CM | POA: Diagnosis not present

## 2015-03-20 NOTE — Progress Notes (Signed)
VASCULAR LAB PRELIMINARY  ARTERIAL  ABI completed: Within normal limits.    RIGHT    LEFT    PRESSURE WAVEFORM  PRESSURE WAVEFORM  BRACHIAL 127 Tri BRACHIAL 122 Tri  DP   DP    AT 150 Tri AT 149 Tri  PT 140 Tri PT 143 Tri  PER   PER    GREAT TOE  NA GREAT TOE  NA    RIGHT LEFT  ABI 1.18 1.17     Landry Mellow, RDMS, RVT  03/20/2015, 9:10 AM

## 2015-03-20 NOTE — Addendum Note (Signed)
Addended by: Valerie Roys on: 03/20/2015 08:47 AM   Modules accepted: Orders

## 2015-03-27 ENCOUNTER — Encounter: Payer: Self-pay | Admitting: Cardiovascular Disease

## 2015-03-27 ENCOUNTER — Ambulatory Visit (INDEPENDENT_AMBULATORY_CARE_PROVIDER_SITE_OTHER): Payer: Medicare Other | Admitting: Cardiovascular Disease

## 2015-03-27 VITALS — BP 120/74 | HR 90 | Ht 75.0 in | Wt 176.8 lb

## 2015-03-27 DIAGNOSIS — I4892 Unspecified atrial flutter: Secondary | ICD-10-CM | POA: Diagnosis not present

## 2015-03-27 DIAGNOSIS — I4891 Unspecified atrial fibrillation: Secondary | ICD-10-CM

## 2015-03-27 MED ORDER — METOPROLOL TARTRATE 75 MG PO TABS: 75.0000 mg | ORAL_TABLET | Freq: Two times a day (BID) | ORAL | Status: DC

## 2015-03-27 MED ORDER — APIXABAN 5 MG PO TABS: 5.0000 mg | ORAL_TABLET | Freq: Two times a day (BID) | ORAL | Status: DC

## 2015-03-27 NOTE — Patient Instructions (Signed)
Medication Instructions:  INCREASE Metoprolol to 75 mg twice daily - take 12 hours apart   Labwork: None Ordered   Testing/Procedures: None Ordered   Follow-Up: Your physician wants you to follow-up in: 6 months with Dr. Acie Fredrickson.  You will receive a reminder letter in the mail two months in advance. If you don't receive a letter, please call our office to schedule the follow-up appointment.

## 2015-03-27 NOTE — Progress Notes (Signed)
Cardiology Office Note   Date:  03/27/2015   ID:  Timothy Martinez, DOB 1951/03/13, MRN [id number]  PCP:  Phill Myron, MD  Cardiologist:   Acie Fredrickson Wonda Cheng, MD   Chief Complaint  Patient presents with  . Follow-up    CHF, atrial fib    Problem List: 1. Atrial fibrillation 2. Chronic systolic CHF   Timothy Martinez is a 64 y.o. male who presents for  Follow up of atrial flutter / fib . I met him in the hospital with atrial flutter when he was admitted with acute pancreatitis.     Feb. 26, 2016:  Timothy Martinez is a 64 y.o. male who presents for follow up of his atrial fib and chronic systolic CHF.   HR is still irreg.   Still short of breath. Still smoking , has cut back  Has not missed any doses of Eliuquis  Sept. 2, 2016:    Doing well  has paroxysmal Afib.    Past Medical History  Diagnosis Date  . Diabetes mellitus   . Hyperlipidemia   . Shortness of breath     with ambulation  . Anginal pain   . Pneumonia   . GERD (gastroesophageal reflux disease)   . Headache(784.0)   . Arthritis   . Alcoholic pancreatitis   . COPD (chronic obstructive pulmonary disease)   . Atrial fibrillation status post cardioversion 08/2014  . CHF (congestive heart failure)   . Alcohol abuse   . Tobacco use disorder, continuous   . Pancreatic pseudocyst     seen on CT scan 07/2014    Past Surgical History  Procedure Laterality Date  . Back surgery    . Neck surgery      not cervical, states had lesion on neck which was removed  . Splenectomy    . Cardioversion N/A 09/22/2014    Procedure: CARDIOVERSION;  Surgeon: Thayer Headings, MD;  Location: [facility] ENDOSCOPY;  Service: Cardiovascular;  Laterality: N/A;     Current Outpatient Prescriptions  Medication Sig Dispense Refill  . albuterol (PROVENTIL HFA;VENTOLIN HFA) 108 (90 BASE) MCG/ACT inhaler Inhale 1-2 puffs into the lungs every 12 (twelve) hours as needed for wheezing or shortness of breath. 18 Inhaler 12  . apixaban  (ELIQUIS) 5 MG TABS tablet Take 1 tablet (5 mg total) by mouth 2 (two) times daily. 66 tablet 0  . atorvastatin (LIPITOR) 40 MG tablet Take 1 tablet (40 mg total) by mouth daily at 6 PM. 90 tablet 3  . Blood Glucose Monitoring Suppl (ONE TOUCH ULTRA 2) W/DEVICE KIT Dispense one glucometer. Diagnosis Type 2 Diabetes. E11.9 1 each 0  . cyanocobalamin 500 MCG tablet Take 500 mcg by mouth daily.    . famotidine (PEPCID) 40 MG tablet TAKE 1 TABLET (40 MG TOTAL) BY MOUTH DAILY. 30 tablet 3  . gabapentin (NEURONTIN) 100 MG capsule Take 100 mg by mouth 3 (three) times daily as needed (pain in legs).    Marland Kitchen glucose blood (ONE TOUCH ULTRA TEST) test strip Use check fasting blood glucose qAM. Type 2 Diabetes E11.9 100 each 3  . metFORMIN (GLUCOPHAGE) 500 MG tablet TAKE 2 TABLETS (1,000 MG TOTAL) BY MOUTH 2 (TWO) TIMES DAILY WITH A MEAL. 360 tablet 1  . metoprolol (LOPRESSOR) 50 MG tablet TAKE 1 TABLET (50 MG TOTAL) BY MOUTH 2 (TWO) TIMES DAILY. 60 tablet 3  . ONE TOUCH LANCETS MISC Check fasting blood glucose qAM. Type 2 DM. E11.9 200 each 1  No current facility-administered medications for this visit.    Allergies:   Tramadol; Prozac; Morphine and related; and Penicillins    Social History:  The patient  reports that he has been smoking Cigarettes.  He started smoking about 50 years ago. He has a 750 pack-year smoking history. He has never used smokeless tobacco. He reports that he drinks alcohol. He reports that he does not use illicit drugs.   Family History:  The patient's family history includes Alcohol abuse in his father; Aneurysm in his mother; Diabetes in his mother; Hypertension in his mother; Stroke in his mother. There is no history of Heart attack.    ROS:  Please see the history of present illness.    Review of Systems: Constitutional:  denies fever, chills, diaphoresis, appetite change and fatigue.  HEENT: denies photophobia, eye pain, redness, hearing loss, ear pain, congestion, sore  throat, rhinorrhea, sneezing, neck pain, neck stiffness and tinnitus.  Respiratory: denies SOB, DOE, cough, chest tightness, and wheezing.  Cardiovascular: denies chest pain, palpitations and leg swelling.  Gastrointestinal: denies nausea, vomiting, abdominal pain, diarrhea, constipation, blood in stool.  Genitourinary: denies dysuria, urgency, frequency, hematuria, flank pain and difficulty urinating.  Musculoskeletal: denies  myalgias, back pain, joint swelling, arthralgias and gait problem.   Skin: denies pallor, rash and wound.  Neurological: denies dizziness, seizures, syncope, weakness, light-headedness, numbness and headaches.   Hematological: denies adenopathy, easy bruising, personal or family bleeding history.  Psychiatric/ Behavioral: denies suicidal ideation, mood changes, confusion, nervousness, sleep disturbance and agitation.       All other systems are reviewed and negative.    PHYSICAL EXAM: VS:  BP 120/74 mmHg  Pulse 90  Ht '6\' 3"'  (1.905 m)  Wt 80.196 kg (176 lb 12.8 oz)  BMI 22.10 kg/m2  SpO2 97% , BMI Body mass index is 22.1 kg/(m^2). GEN: Well nourished, well developed, in no acute distress HEENT: normal Neck: no JVD, carotid bruits, or masses Cardiac: irreg. Irreg. ; no murmurs, rubs, or gallops,no edema  Respiratory:  clear to auscultation bilaterally, normal work of breathing GI: soft, nontender, nondistended, + BS MS: no deformity or atrophy Skin: warm and dry, no rash Neuro:  Strength and sensation are intact Psych: normal   EKG:  EKG is ordered today. NSR at 64.   Normal ECG    Recent Labs: 08/05/2014: Magnesium 1.9 01/15/2015: ALT 13*; Hemoglobin 13.9; Platelets 227 01/16/2015: BUN <5*; Creatinine, Ser 0.79; Potassium 4.1; Sodium 135    Lipid Panel    Component Value Date/Time   CHOL 120 08/05/2014 0845   TRIG 105 08/05/2014 0845   HDL 27* 08/05/2014 0845   CHOLHDL 4.4 08/05/2014 0845   VLDL 21 08/05/2014 0845   LDLCALC 72 08/05/2014 0845        Wt Readings from Last 3 Encounters:  03/27/15 80.196 kg (176 lb 12.8 oz)  03/16/15 81.733 kg (180 lb 3 oz)  02/13/15 81.251 kg (179 lb 2 oz)      Other studies Reviewed: Additional studies/ records that were reviewed today include: . Review of the above records demonstrates:    ASSESSMENT AND PLAN:  1. Atrial fibrillation: The patient was successfully cardioverted .   He remains in normal sinus rhythm. Continue Eliquis for now.  2. Chronic systolic congestive heart failure: His blood pressures well-controlled. We will continue with the current dose of metoprolol.       3. COPD:  Cigarette smoking: I've encouraged him to stop smoking. He has COPD.  Current medicines  are reviewed at length with the patient today.  The patient does not have concerns regarding medicines.  The following changes have been made:     Disposition:   FU with me in 6 months.    Signed, Camaria Gerald, Wonda Cheng, MD  03/27/2015 9:15 AM    [facility] [address], [place], [place]  [address] Phone: [phone]; Fax: [phone]

## 2015-04-01 ENCOUNTER — Ambulatory Visit (INDEPENDENT_AMBULATORY_CARE_PROVIDER_SITE_OTHER): Payer: Medicare Other | Admitting: Family Medicine

## 2015-04-01 ENCOUNTER — Encounter: Payer: Self-pay | Admitting: Family Medicine

## 2015-04-01 VITALS — BP 132/74 | HR 86 | Temp 97.9°F | Ht 75.0 in | Wt 176.0 lb

## 2015-04-01 DIAGNOSIS — E1140 Type 2 diabetes mellitus with diabetic neuropathy, unspecified: Secondary | ICD-10-CM | POA: Diagnosis not present

## 2015-04-01 DIAGNOSIS — J449 Chronic obstructive pulmonary disease, unspecified: Secondary | ICD-10-CM | POA: Diagnosis not present

## 2015-04-01 DIAGNOSIS — G629 Polyneuropathy, unspecified: Secondary | ICD-10-CM | POA: Diagnosis not present

## 2015-04-01 MED ORDER — FLUTICASONE FUROATE-VILANTEROL 200-25 MCG/INH IN AEPB: 1.0000 | INHALATION_SPRAY | Freq: Every day | RESPIRATORY_TRACT | Status: DC

## 2015-04-01 NOTE — Progress Notes (Signed)
  Patient name: Timothy Martinez MRN [id number]  Date of birth: 08-23-1950  CC & HPI:  Timothy Martinez is a 64 y.o. male presenting today for COPD, DM .   COPD - Using albuterol daily without spacer - Continues to endorse shortness of breath, decrease in activity due to breathlessness - Reduced smoking to 10 cigarettes daily  DIABETES   Symptoms of Hypoglycemia? no  Comorbid Symptoms: no Chest pain; SOB- yes, see above; Neuropathy - yes, much improved with gabapentin: no Vision problems  Medication Compliance: yes   Medication Side Effects: Denies sleepiness, dizziness  Counseling  Smoking status: 10 cigs daily    ROS: See HPI   Medical & Surgical Hx:  Reviewed  Medications & Allergies: Reviewed  Social History: Reviewed:   Objective Findings:  Vitals: BP 132/74 mmHg  Pulse 86  Temp(Src) 97.9 F (36.6 C) (Oral)  Ht '6\' 3"'$  (1.905 m)  Wt 176 lb (79.833 kg)  BMI 22.00 kg/m2  Gen: NAD CV: RRR w/o m/r/g, pulses +2 b/l Resp: decreased breath sounds; normal respiratory effort  Assessment & Plan:   COPD (chronic obstructive pulmonary disease) Continues to have shortness of breath and decreased physical activity due to this.  Working on smoking cessation.  Currently at 10 cigs daily - Recommended smoking cessation; he will call if he wants to try nicotine replacement or other medication therapy - Start Breo - recommend spacer use with MDIs   Diabetes mellitus Well-controlled - Follow-up in 6 months  Neuropathy Well-controlled with gabapentin 100 mg twice a day - recent ABI's negative; recommended smoking cessation

## 2015-04-01 NOTE — Assessment & Plan Note (Signed)
Well-controlled - Follow-up in 6 months

## 2015-04-01 NOTE — Patient Instructions (Addendum)
It was great seeing you today.   1. Start Breo: 1 inhalation every morning.    Please bring all your medications to every doctors visit  Sign up for My Chart to have easy access to your labs results, and communication with your Primary care physician.  Next Appointment  Please call to make an appointment with Dr Berkley Harvey in 6 month or sooner if you wish to discuss stopping smoking   I look forward to talking with you again at our next visit. If you have any questions or concerns before then, please call the clinic at [phone].  Take Care,   Dr Phill Myron

## 2015-04-01 NOTE — Assessment & Plan Note (Signed)
Continues to have shortness of breath and decreased physical activity due to this.  Working on smoking cessation.  Currently at 10 cigs daily - Recommended smoking cessation; he will call if he wants to try nicotine replacement or other medication therapy - Start Breo - recommend spacer use with MDIs

## 2015-04-01 NOTE — Assessment & Plan Note (Addendum)
Well-controlled with gabapentin 100 mg twice a day - recent ABI's negative; recommended smoking cessation

## 2015-04-30 ENCOUNTER — Other Ambulatory Visit: Payer: Self-pay | Admitting: Cardiovascular Disease

## 2015-06-01 ENCOUNTER — Other Ambulatory Visit: Payer: Self-pay | Admitting: Family Medicine

## 2015-06-19 ENCOUNTER — Encounter (HOSPITAL_COMMUNITY): Payer: Self-pay | Admitting: Emergency Medicine

## 2015-06-19 ENCOUNTER — Inpatient Hospital Stay (HOSPITAL_COMMUNITY)
Admission: EM | Admit: 2015-06-19 | Discharge: 2015-06-20 | DRG: 439 | Disposition: A | Discharge status: Home / Self Care | Payer: Medicare HMO | Attending: Family Medicine | Admitting: Family Medicine

## 2015-06-19 DIAGNOSIS — Z7984 Long term (current) use of oral hypoglycemic drugs: Secondary | ICD-10-CM

## 2015-06-19 DIAGNOSIS — F1010 Alcohol abuse, uncomplicated: Secondary | ICD-10-CM | POA: Diagnosis present

## 2015-06-19 DIAGNOSIS — Z720 Tobacco use: Secondary | ICD-10-CM | POA: Diagnosis present

## 2015-06-19 DIAGNOSIS — I4892 Unspecified atrial flutter: Secondary | ICD-10-CM | POA: Diagnosis present

## 2015-06-19 DIAGNOSIS — I509 Heart failure, unspecified: Secondary | ICD-10-CM | POA: Diagnosis present

## 2015-06-19 DIAGNOSIS — F419 Anxiety disorder, unspecified: Secondary | ICD-10-CM | POA: Diagnosis present

## 2015-06-19 DIAGNOSIS — F17210 Nicotine dependence, cigarettes, uncomplicated: Secondary | ICD-10-CM | POA: Diagnosis present

## 2015-06-19 DIAGNOSIS — J449 Chronic obstructive pulmonary disease, unspecified: Secondary | ICD-10-CM | POA: Diagnosis present

## 2015-06-19 DIAGNOSIS — K852 Alcohol induced acute pancreatitis without necrosis or infection: Secondary | ICD-10-CM | POA: Diagnosis not present

## 2015-06-19 DIAGNOSIS — Z8679 Personal history of other diseases of the circulatory system: Secondary | ICD-10-CM | POA: Diagnosis not present

## 2015-06-19 DIAGNOSIS — Z79899 Other long term (current) drug therapy: Secondary | ICD-10-CM

## 2015-06-19 DIAGNOSIS — R112 Nausea with vomiting, unspecified: Secondary | ICD-10-CM | POA: Diagnosis not present

## 2015-06-19 DIAGNOSIS — R1013 Epigastric pain: Secondary | ICD-10-CM | POA: Diagnosis present

## 2015-06-19 DIAGNOSIS — M1990 Unspecified osteoarthritis, unspecified site: Secondary | ICD-10-CM | POA: Diagnosis present

## 2015-06-19 DIAGNOSIS — K219 Gastro-esophageal reflux disease without esophagitis: Secondary | ICD-10-CM | POA: Diagnosis present

## 2015-06-19 DIAGNOSIS — I4891 Unspecified atrial fibrillation: Secondary | ICD-10-CM | POA: Diagnosis present

## 2015-06-19 DIAGNOSIS — Z7901 Long term (current) use of anticoagulants: Secondary | ICD-10-CM

## 2015-06-19 DIAGNOSIS — E1140 Type 2 diabetes mellitus with diabetic neuropathy, unspecified: Secondary | ICD-10-CM | POA: Diagnosis present

## 2015-06-19 DIAGNOSIS — K2970 Gastritis, unspecified, without bleeding: Secondary | ICD-10-CM | POA: Diagnosis not present

## 2015-06-19 DIAGNOSIS — E785 Hyperlipidemia, unspecified: Secondary | ICD-10-CM | POA: Diagnosis not present

## 2015-06-19 DIAGNOSIS — F329 Major depressive disorder, single episode, unspecified: Secondary | ICD-10-CM | POA: Diagnosis present

## 2015-06-19 LAB — COMPREHENSIVE METABOLIC PANEL
ALT: 12 U/L — ABNORMAL LOW (ref 17–63)
AST: 20 U/L (ref 15–41)
Albumin: 3.8 g/dL (ref 3.5–5.0)
Alkaline Phosphatase: 60 U/L (ref 38–126)
Anion gap: 7 (ref 5–15)
BUN: <5 mg/dL — ABNORMAL LOW (ref 6–20)
CO2: 30 mmol/L (ref 22–32)
Calcium: 9.9 mg/dL (ref 8.9–10.3)
Chloride: 102 mmol/L (ref 101–111)
Creatinine, Ser: 0.79 mg/dL (ref 0.61–1.24)
GFR calc Af Amer: >60 mL/min (ref >60)
GFR calc non Af Amer: >60 mL/min (ref >60)
Glucose, Bld: 143 mg/dL — ABNORMAL HIGH (ref 65–99)
Potassium: 4.9 mmol/L (ref 3.5–5.1)
Sodium: 139 mmol/L (ref 135–145)
Total Bilirubin: 0.6 mg/dL (ref 0.3–1.2)
Total Protein: 7.4 g/dL (ref 6.5–8.1)

## 2015-06-19 LAB — CBC WITH DIFFERENTIAL/PLATELET
Basophils Absolute: 0.1 10*3/uL (ref 0.0–0.1)
Basophils Relative: 1 %
Eosinophils Absolute: 0.6 10*3/uL (ref 0.0–0.7)
Eosinophils Relative: 6 %
HCT: 50.2 % (ref 39.0–52.0)
Hemoglobin: 16.8 g/dL (ref 13.0–17.0)
Lymphocytes Relative: 36 %
Lymphs Abs: 3.7 10*3/uL (ref 0.7–4.0)
MCH: 31.6 pg (ref 26.0–34.0)
MCHC: 33.5 g/dL (ref 30.0–36.0)
MCV: 94.4 fL (ref 78.0–100.0)
Monocytes Absolute: 0.7 10*3/uL (ref 0.1–1.0)
Monocytes Relative: 7 %
Neutro Abs: 5.2 10*3/uL (ref 1.7–7.7)
Neutrophils Relative %: 50 %
Platelets: 285 10*3/uL (ref 150–400)
RBC: 5.32 MIL/uL (ref 4.22–5.81)
RDW: 13.6 % (ref 11.5–15.5)
WBC: 10.2 10*3/uL (ref 4.0–10.5)

## 2015-06-19 LAB — AMYLASE: Amylase: 782 U/L — ABNORMAL HIGH (ref 28–100)

## 2015-06-19 LAB — LIPASE, BLOOD: Lipase: 1294 U/L — ABNORMAL HIGH (ref 11–51)

## 2015-06-19 MED ORDER — HYDROMORPHONE HCL 1 MG/ML IJ SOLN
1.0000 mg | Freq: Once | INTRAMUSCULAR | Status: AC
  Administered 2015-06-19: 1 mg via INTRAVENOUS
  Filled 2015-06-19: qty 1

## 2015-06-19 MED ORDER — SODIUM CHLORIDE 0.9 % IV SOLN
INTRAVENOUS | Status: AC
  Administered 2015-06-19 (×2): via INTRAVENOUS

## 2015-06-19 MED ORDER — NICOTINE 21 MG/24HR TD PT24: 21.0000 mg | MEDICATED_PATCH | Freq: Every day | TRANSDERMAL | Status: DC | PRN

## 2015-06-19 MED ORDER — SODIUM CHLORIDE 0.9 % IV BOLUS (SEPSIS)
1000.0000 mL | Freq: Once | INTRAVENOUS | Status: AC
  Administered 2015-06-19: 1000 mL via INTRAVENOUS

## 2015-06-19 MED ORDER — METFORMIN HCL 500 MG PO TABS
500.0000 mg | ORAL_TABLET | Freq: Two times a day (BID) | ORAL | Status: DC
  Administered 2015-06-19 – 2015-06-20 (×3): 500 mg via ORAL
  Filled 2015-06-19 (×3): qty 1

## 2015-06-19 MED ORDER — APIXABAN 5 MG PO TABS
5.0000 mg | ORAL_TABLET | Freq: Two times a day (BID) | ORAL | Status: DC
  Administered 2015-06-19 – 2015-06-20 (×3): 5 mg via ORAL
  Filled 2015-06-19 (×3): qty 1

## 2015-06-19 MED ORDER — ONDANSETRON HCL 4 MG PO TABS: 4.0000 mg | ORAL_TABLET | Freq: Four times a day (QID) | ORAL | Status: DC | PRN

## 2015-06-19 MED ORDER — GABAPENTIN 100 MG PO CAPS: 100.0000 mg | ORAL_CAPSULE | Freq: Three times a day (TID) | ORAL | Status: DC | PRN

## 2015-06-19 MED ORDER — ONDANSETRON HCL 4 MG/2ML IJ SOLN
4.0000 mg | Freq: Once | INTRAMUSCULAR | Status: AC
  Administered 2015-06-19: 4 mg via INTRAVENOUS
  Filled 2015-06-19: qty 2

## 2015-06-19 MED ORDER — METOPROLOL TARTRATE 50 MG PO TABS
75.0000 mg | ORAL_TABLET | Freq: Two times a day (BID) | ORAL | Status: DC
  Administered 2015-06-19 – 2015-06-20 (×3): 75 mg via ORAL
  Filled 2015-06-19 (×3): qty 1

## 2015-06-19 MED ORDER — POLYETHYLENE GLYCOL 3350 17 G PO PACK: 17.0000 g | PACK | Freq: Every day | ORAL | Status: DC | PRN

## 2015-06-19 MED ORDER — ATORVASTATIN CALCIUM 40 MG PO TABS
40.0000 mg | ORAL_TABLET | Freq: Every day | ORAL | Status: DC
  Administered 2015-06-19: 40 mg via ORAL
  Filled 2015-06-19: qty 1

## 2015-06-19 MED ORDER — DOCUSATE SODIUM 100 MG PO CAPS
100.0000 mg | ORAL_CAPSULE | Freq: Two times a day (BID) | ORAL | Status: DC
  Administered 2015-06-19 – 2015-06-20 (×3): 100 mg via ORAL
  Filled 2015-06-19 (×3): qty 1

## 2015-06-19 MED ORDER — BUDESONIDE-FORMOTEROL FUMARATE 160-4.5 MCG/ACT IN AERO
2.0000 | INHALATION_SPRAY | Freq: Two times a day (BID) | RESPIRATORY_TRACT | Status: DC
  Administered 2015-06-19 – 2015-06-20 (×2): 2 via RESPIRATORY_TRACT
  Filled 2015-06-19: qty 6

## 2015-06-19 MED ORDER — ACETAMINOPHEN 325 MG PO TABS
650.0000 mg | ORAL_TABLET | Freq: Four times a day (QID) | ORAL | Status: DC | PRN
  Administered 2015-06-19 – 2015-06-20 (×2): 650 mg via ORAL
  Filled 2015-06-19 (×2): qty 2

## 2015-06-19 MED ORDER — HYDROMORPHONE HCL 1 MG/ML IJ SOLN
1.0000 mg | INTRAMUSCULAR | Status: DC | PRN
  Administered 2015-06-19 (×5): 1 mg via INTRAVENOUS
  Filled 2015-06-19 (×5): qty 1

## 2015-06-19 MED ORDER — ALBUTEROL SULFATE HFA 108 (90 BASE) MCG/ACT IN AERS: 1.0000 | INHALATION_SPRAY | Freq: Two times a day (BID) | RESPIRATORY_TRACT | Status: DC | PRN

## 2015-06-19 MED ORDER — ALBUTEROL SULFATE (2.5 MG/3ML) 0.083% IN NEBU: 2.5000 mg | INHALATION_SOLUTION | Freq: Two times a day (BID) | RESPIRATORY_TRACT | Status: DC | PRN

## 2015-06-19 MED ORDER — FAMOTIDINE 20 MG PO TABS
40.0000 mg | ORAL_TABLET | Freq: Every day | ORAL | Status: DC
  Administered 2015-06-19 – 2015-06-20 (×2): 40 mg via ORAL
  Filled 2015-06-19 (×2): qty 2

## 2015-06-19 MED ORDER — ONDANSETRON HCL 4 MG/2ML IJ SOLN: 4.0000 mg | Freq: Four times a day (QID) | INTRAMUSCULAR | Status: DC | PRN

## 2015-06-19 NOTE — ED Notes (Signed)
Per EMS, pt has had abdominal pain x 2 hours. Pt states that he had some [place] and 4 beers. EMS states that the pt vomited 4 times before calling 911, stating that his doctor wants him to call 911 if he vomits. Pt with hx of pancreatitis. Pt ambulatory to room from ambulance.

## 2015-06-19 NOTE — H&P (Signed)
[facility] Admission History and Physical Service Pager: [phone]  Patient name: Timothy Martinez Medical record number: [id number] Date of birth: 11/20/1950 Age: 64 y.o. Gender: male  Primary Care Provider: Phill Myron, MD Consultants: none Code Status: Full  Chief Complaint: abdominal pain  Assessment and Plan: Timothy Martinez is a 64 y.o. male presenting with epigastric pain after alcohol use. PMH is significant for alcohol abuse and pancreatitis, COPD, DM, HLD, GERD, afib s/p cardioversion  Alcohol induced pancreatitis: 1-2 admissions a year for this. Describes as typical of past episodes. BMP wnl, lipase elevated 1294, amylase elevated 782. Received 1L bolus in ED, dilaudid; on admission currently seems comfortable. Counseled regarding alcoholic pancreatitis and putting himself at risk of death - NPO with advance diet likely later this afternoon - dilaudid 102m q3hr prn - NS 175 cc/hr - if clinically worsens need to consider advanced imaging (last CT abdomen was Jan 2016 showed pseuocyst)  Hx Afib s/p cardioversion: stable, NSR - continue eliquis 543mBID  DM: stable, last A1c was well controlled at 6.3 on 02/13/15 - continue metformin  HLD: stable - continue atorvastatin  COPD: stable - continue albuterol prn, breo (formulary equiv)  Tobacco abuse: continues to be 1.5ppd smoker - nicotine patch prn - smoking cessation counseling  FEN/GI: NS @ 175cc/hr / NPO with advance to clears likely later today Prophylaxis: on full dose eliquis  Disposition: admit  History of Present Illness:  Timothy Martinez a [age].o. male presenting with epigastric abdominal pain. Patient states he was doing well until several hours after eating [place] yesterday evening and drinking 4 beers ("light" beers, says alcohol percentage around 2%). Pain is located near epigastrium and left upper side like it typically is for him with a pancreatitis attack. Pain is  sharp, made worse with bending/sitting. He has had nausea and one episode of vomiting after trying to eat jello. He denies blood in the vomit. He had subjective fever with profuse sweating before EMS had arrived and he got pain medicine. He has had no diarrhea or constipation. He says his last alcohol use before this was approx 2 months ago.  Review Of Systems: Per HPI with the following additions: no HA, no CP, no SOB, no dysuria Otherwise the remainder of the systems were negative.  Patient Active Problem List   Diagnosis Date Noted  . Neuropathy (HCG[place]own08/22/2016  . Hematuria, microscopic 02/13/2015  . Anxiety and depression 01/20/2015  . Acute pancreatitis 01/14/2015  . Alcohol-induced acute pancreatitis   . Neck pain, bilateral posterior 10/15/2014  . Chest pain   . Atrial flutter, unspecified   . Atrial fibrillation with rapid ventricular response (HCHarrison  . COLD (chronic obstructive lung disease) (HCOso  . Pancreatitis 08/04/2014  . Left foot pain 10/12/2013  . Low back pain 12/21/2012  . Abdominal pain, unspecified site 09/28/2012  . Polyuria 08/21/2012  . COPD (chronic obstructive pulmonary disease) (HCEar[place]10/10/2011  . GERD (gastroesophageal reflux disease) 03/05/2012  . Alcohol abuse 06/09/2011  . Diabetes mellitus (HCMoc[place]11/11/2010  . Chronic alcoholic pancreatitis (HCElber[place]11/11/2010  . Hyperlipidemia 05/30/2011  . Tobacco abuse 05/30/2011    Past Medical History: Past Medical History  Diagnosis Date  . Diabetes mellitus   . Hyperlipidemia   . Shortness of breath     with ambulation  . Anginal pain (HCDoug[place]  . Pneumonia   . GERD (gastroesophageal reflux disease)   . Headache(784.0)   . Arthritis   .  Alcoholic pancreatitis   . COPD (chronic obstructive pulmonary disease) ([place])   . Atrial fibrillation status post cardioversion ([place]) 08/2014  . CHF (congestive heart failure) ([place])   . Alcohol abuse   . Tobacco use disorder, continuous   . Pancreatic pseudocyst      seen on CT scan 07/2014    Past Surgical History: Past Surgical History  Procedure Laterality Date  . Back surgery    . Neck surgery      not cervical, states had lesion on neck which was removed  . Splenectomy    . Cardioversion N/A 09/22/2014    Procedure: CARDIOVERSION;  Surgeon: Thayer Headings, MD;  Location: [facility] ENDOSCOPY;  Service: Cardiovascular;  Laterality: N/A;    Social History: Social History  Substance Use Topics  . Smoking status: Current Every Day Smoker -- 15.00 packs/day for 50 years    Types: Cigarettes    Start date: 04/09/1964  . Smokeless tobacco: Never Used     Comment: trying to quit. smoked 3 ppd before  . Alcohol Use: 0.0 oz/week    6-7 Cans of beer per week     Comment: "a beer once in a while"   Additional social history: none  Please also refer to relevant sections of EMR.  Family History: Family History  Problem Relation Age of Onset  . Diabetes Mother   . Aneurysm Mother   . Alcohol abuse Father   . Heart attack Neg Hx   . Stroke Mother   . Hypertension Mother    Allergies and Medications: Allergies  Allergen Reactions  . Tramadol Nausea And Vomiting  . Prozac [Fluoxetine Hcl]     Very sick on stomach  . Morphine And Related Swelling    Pt states he can tolerate oxycodone and hydromorphone  . Penicillins Rash   No current facility-administered medications on file prior to encounter.   Current Outpatient Prescriptions on File Prior to Encounter  Medication Sig Dispense Refill  . albuterol (PROVENTIL HFA;VENTOLIN HFA) 108 (90 BASE) MCG/ACT inhaler Inhale 1-2 puffs into the lungs every 12 (twelve) hours as needed for wheezing or shortness of breath. 18 Inhaler 12  . atorvastatin (LIPITOR) 40 MG tablet Take 1 tablet (40 mg total) by mouth daily at 6 PM. 90 tablet 3  . Blood Glucose Monitoring Suppl (ONE TOUCH ULTRA 2) W/DEVICE KIT Dispense one glucometer. Diagnosis Type 2 Diabetes. E11.9 1 each 0  . cyanocobalamin 500 MCG tablet Take 500  mcg by mouth daily.    Marland Kitchen ELIQUIS 5 MG TABS tablet TAKE 1 TABLET (5 MG TOTAL) BY MOUTH 2 (TWO) TIMES DAILY. 60 tablet 5  . famotidine (PEPCID) 40 MG tablet TAKE 1 TABLET (40 MG TOTAL) BY MOUTH DAILY. 30 tablet 3  . Fluticasone Furoate-Vilanterol (BREO ELLIPTA) 200-25 MCG/INH AEPB Inhale 1 Inhaler into the lungs daily. 1 each 11  . gabapentin (NEURONTIN) 100 MG capsule Take 100 mg by mouth 3 (three) times daily as needed (pain in legs).    Marland Kitchen glucose blood (ONE TOUCH ULTRA TEST) test strip Use check fasting blood glucose qAM. Type 2 Diabetes E11.9 100 each 3  . metFORMIN (GLUCOPHAGE) 500 MG tablet TAKE 2 TABLETS (1,000 MG TOTAL) BY MOUTH 2 (TWO) TIMES DAILY WITH A MEAL. 360 tablet 1  . metoprolol 75 MG TABS Take 75 mg by mouth 2 (two) times daily. 60 tablet 11  . ONE TOUCH LANCETS MISC Check fasting blood glucose qAM. Type 2 DM. E11.9 200 each 1  Objective: BP 117/54 mmHg  Pulse 67  Temp(Src) 97.5 F (36.4 C) (Oral)  Resp 18  SpO2 99% Exam: General: NAD, laying in bed flat on his side Eyes: PERRL, EOMI. Normal conjunctiva and sclera  ENTM: dry tacking mouth, no oropharyngeal lesions Neck: supple Cardiovascular: RRR, normal s1s2, no murmur appreciated, 2+ radial pulses bilaterally  Respiratory: clear to auscultation bilaterally, normal effort Abdomen: soft, tender to palpation epigastrium, LUQ, there is mild rebound and guarding, normal bowel sounds MSK: normal bulk/tone, no deformities Skin: no rashes Neuro: alert and oriented, no deficits noted Psych: normal mood and affect. Normal thought content and speech  Labs and Imaging: CBC BMET   Recent Labs Lab 06/19/15 0149  WBC 10.2  HGB 16.8  HCT 50.2  PLT 285    Recent Labs Lab 06/19/15 0149  NA 139  K 4.9  CL 102  CO2 30  BUN <5*  CREATININE 0.79  GLUCOSE 143*  CALCIUM 9.9      Leone Brand, MD 06/19/2015, 3:02 AM PGY-3, [place] Intern pager: [phone], text pages welcome

## 2015-06-19 NOTE — ED Provider Notes (Signed)
CSN: [id number]     Arrival date & time 06/19/15  0120 History   First MD Initiated Contact with Patient 06/19/15 0146     Chief Complaint  Patient presents with  . Pancreatitis     (Consider location/radiation/quality/duration/timing/severity/associated sxs/prior Treatment) HPI Comments: 64 year old male with a history of diabetes mellitus, dyslipidemia, esophageal reflux, COPD, atrial fibrillation, congestive heart failure, and alcohol-induced pancreatitis presents to the emergency department for evaluation of epigastric and left upper quadrant abdominal pain. Patient states that he drank 4 beers between the hours of new knee and 2 PM yesterday. He began to notice worsening pain in his upper abdomen at approximately 5 PM. Patient reports 4 episodes of emesis as well as persistent nausea. Pain is aggravated with palpation to his abdominal wall. Patient denies taking any medications prior to symptoms. He states that his pain feels identical to his past episodes of acute on chronic pancreatitis. Patient denies a history of abdominal surgeries. He has had no fever, but does complain of chills and cold sweats. No hematemesis, melena, hematochezia, or urinary symptoms.  PCP - Dr. Berkley Harvey  The history is provided by the patient. No language interpreter was used.    Past Medical History  Diagnosis Date  . Diabetes mellitus   . Hyperlipidemia   . Shortness of breath     with ambulation  . Anginal pain ([place])   . Pneumonia   . GERD (gastroesophageal reflux disease)   . Headache(784.0)   . Arthritis   . Alcoholic pancreatitis   . COPD (chronic obstructive pulmonary disease) ([place])   . Atrial fibrillation status post cardioversion ([place]) 08/2014  . CHF (congestive heart failure) ([place])   . Alcohol abuse   . Tobacco use disorder, continuous   . Pancreatic pseudocyst     seen on CT scan 07/2014   Past Surgical History  Procedure Laterality Date  . Back surgery    . Neck surgery      not  cervical, states had lesion on neck which was removed  . Splenectomy    . Cardioversion N/A 09/22/2014    Procedure: CARDIOVERSION;  Surgeon: Thayer Headings, MD;  Location: [facility] ENDOSCOPY;  Service: Cardiovascular;  Laterality: N/A;   Family History  Problem Relation Age of Onset  . Diabetes Mother   . Aneurysm Mother   . Alcohol abuse Father   . Heart attack Neg Hx   . Stroke Mother   . Hypertension Mother    Social History  Substance Use Topics  . Smoking status: Current Every Day Smoker -- 15.00 packs/day for 50 years    Types: Cigarettes    Start date: 04/09/1964  . Smokeless tobacco: Never Used     Comment: trying to quit. smoked 3 ppd before  . Alcohol Use: 0.0 oz/week    6-7 Cans of beer per week     Comment: "a beer once in a while"    Review of Systems  Constitutional: Positive for chills. Negative for fever.  Gastrointestinal: Positive for nausea, vomiting and abdominal pain.  All other systems reviewed and are negative.   Allergies  Tramadol; Prozac; Morphine and related; and Penicillins  Home Medications   Prior to Admission medications   Medication Sig Start Date End Date Taking? Authorizing Provider  albuterol (PROVENTIL HFA;VENTOLIN HFA) 108 (90 BASE) MCG/ACT inhaler Inhale 1-2 puffs into the lungs every 12 (twelve) hours as needed for wheezing or shortness of breath. 09/24/14   Dickie La, MD  atorvastatin (LIPITOR) 40 MG  tablet Take 1 tablet (40 mg total) by mouth daily at 6 PM. 11/05/14   Olam Idler, MD  Blood Glucose Monitoring Suppl (ONE TOUCH ULTRA 2) W/DEVICE KIT Dispense one glucometer. Diagnosis Type 2 Diabetes. E11.9 09/12/14   Willeen Niece, MD  cyanocobalamin 500 MCG tablet Take 500 mcg by mouth daily.    Historical Provider, MD  ELIQUIS 5 MG TABS tablet TAKE 1 TABLET (5 MG TOTAL) BY MOUTH 2 (TWO) TIMES DAILY. 04/30/15   Thayer Headings, MD  famotidine (PEPCID) 40 MG tablet TAKE 1 TABLET (40 MG TOTAL) BY MOUTH DAILY. 02/16/15   Olam Idler, MD   Fluticasone Furoate-Vilanterol (BREO ELLIPTA) 200-25 MCG/INH AEPB Inhale 1 Inhaler into the lungs daily. 04/01/15   Olam Idler, MD  gabapentin (NEURONTIN) 100 MG capsule Take 100 mg by mouth 3 (three) times daily as needed (pain in legs).    Historical Provider, MD  glucose blood (ONE TOUCH ULTRA TEST) test strip Use check fasting blood glucose qAM. Type 2 Diabetes E11.9 09/12/14   Willeen Niece, MD  metFORMIN (GLUCOPHAGE) 500 MG tablet TAKE 2 TABLETS (1,000 MG TOTAL) BY MOUTH 2 (TWO) TIMES DAILY WITH A MEAL. 02/12/15   Olam Idler, MD  metoprolol 75 MG TABS Take 75 mg by mouth 2 (two) times daily. 03/27/15   Thayer Headings, MD  ONE TOUCH LANCETS MISC Check fasting blood glucose qAM. Type 2 DM. E11.9 09/12/14   Willeen Niece, MD   BP 117/54 mmHg  Pulse 67  Temp(Src) 97.5 F (36.4 C) (Oral)  Resp 18  SpO2 99%   Physical Exam  Constitutional: He is oriented to person, place, and time. He appears well-developed and well-nourished. No distress.  Patient appears uncomfortable  HENT:  Head: Normocephalic and atraumatic.  Eyes: Conjunctivae and EOM are normal. No scleral icterus.  Neck: Normal range of motion.  Cardiovascular: Normal rate, regular rhythm and intact distal pulses.   Pulmonary/Chest: Effort normal and breath sounds normal. No respiratory distress. He has no wheezes. He has no rales.  Abdominal: Soft. He exhibits no distension. There is tenderness. There is guarding. There is no rebound.  Soft abdomen with tenderness to palpation in the epigastric and left upper quadrants. There is voluntary guarding. No masses or peritoneal signs. Abdomen is nondistended.  Musculoskeletal: Normal range of motion.  Neurological: He is alert and oriented to person, place, and time. He exhibits normal muscle tone. Coordination normal.  Skin: Skin is warm and dry. No rash noted. He is not diaphoretic. No erythema. No pallor.  Psychiatric: He has a normal mood and affect. His behavior is normal.   Nursing note and vitals reviewed.   ED Course  Procedures (including critical care time) Labs Review Labs Reviewed  COMPREHENSIVE METABOLIC PANEL - Abnormal; Notable for the following:    Glucose, Bld 143 (*)    BUN <5 (*)    ALT 12 (*)    All other components within normal limits  LIPASE, BLOOD - Abnormal; Notable for the following:    Lipase 1294 (*)    All other components within normal limits  AMYLASE - Abnormal; Notable for the following:    Amylase 782 (*)    All other components within normal limits  CBC WITH DIFFERENTIAL/PLATELET    Imaging Review No results found.   I have personally reviewed and evaluated these images and lab results as part of my medical decision-making.   EKG Interpretation None  MDM   Final diagnoses:  Alcohol-induced acute pancreatitis, unspecified complication status    64 year old male presents to the emergency department for evaluation of abdominal pain which began at approximately 1700 yesterday. Patient reports drinking 4 beers between noon and 2 PM yesterday. Symptoms, physical exam, and laboratory workup consistent with acute pancreatitis. Patient has a history of multiple hospitalizations for acute alcohol-induced pancreatitis. Case discussed with Family Medicine service who will admit for further hydration and pain control.   Filed Vitals:   06/19/15 0125 06/19/15 0130  BP: 140/76 117/54  Pulse: 55 67  Temp: 97.5 F (36.4 C)   TempSrc: Oral   Resp: 18   SpO2: 100% 99%     Antonietta Breach, PA-C 94/99/71 [id number]  Delora Fuel, MD 90/68/93 4068

## 2015-06-20 DIAGNOSIS — K852 Alcohol induced acute pancreatitis without necrosis or infection: Principal | ICD-10-CM

## 2015-06-20 DIAGNOSIS — Z8679 Personal history of other diseases of the circulatory system: Secondary | ICD-10-CM

## 2015-06-20 DIAGNOSIS — Z7901 Long term (current) use of anticoagulants: Secondary | ICD-10-CM

## 2015-06-20 DIAGNOSIS — E785 Hyperlipidemia, unspecified: Secondary | ICD-10-CM

## 2015-06-20 LAB — CBC
HCT: 43.8 % (ref 39.0–52.0)
Hemoglobin: 14.4 g/dL (ref 13.0–17.0)
MCH: 31.1 pg (ref 26.0–34.0)
MCHC: 32.9 g/dL (ref 30.0–36.0)
MCV: 94.6 fL (ref 78.0–100.0)
Platelets: 272 10*3/uL (ref 150–400)
RBC: 4.63 MIL/uL (ref 4.22–5.81)
RDW: 14.1 % (ref 11.5–15.5)
WBC: 9.2 10*3/uL (ref 4.0–10.5)

## 2015-06-20 LAB — BASIC METABOLIC PANEL
Anion gap: 6 (ref 5–15)
BUN: 6 mg/dL (ref 6–20)
CO2: 28 mmol/L (ref 22–32)
Calcium: 8.7 mg/dL — ABNORMAL LOW (ref 8.9–10.3)
Chloride: 103 mmol/L (ref 101–111)
Creatinine, Ser: 0.75 mg/dL (ref 0.61–1.24)
GFR calc Af Amer: >60 mL/min (ref >60)
GFR calc non Af Amer: >60 mL/min (ref >60)
Glucose, Bld: 98 mg/dL (ref 65–99)
Potassium: 4 mmol/L (ref 3.5–5.1)
Sodium: 137 mmol/L (ref 135–145)

## 2015-06-20 NOTE — Progress Notes (Signed)
Order rcvd for discharge.  IV removed with catheter intact.  Discharge education provided to Pt with friend at bedside.  All questions answered.  Pt denies any abdominal/chest pain or sob.  Pt stable at discharge.  Pt discharged ambulatory with friend.  No s/s of distress.

## 2015-06-20 NOTE — Discharge Instructions (Signed)
You were hospitalized for pancreatitis, which was likely related to drinking alcohol. In order to prevent this from happening in the future, we recommend that you stop drinking alcohol. If you have a little bit of abdominal soreness at home, you can continue to take Tylenol.

## 2015-06-20 NOTE — Progress Notes (Signed)
Family Medicine Teaching Service Daily Progress Note Intern Pager: [phone]  Patient name: Timothy Martinez Medical record number: [id number] Date of birth: 1951/04/14 Age: 64 y.o. Gender: male  Primary Care Provider: Phill Myron, MD Consultants: None Code Status: Full  Pt Overview and Major Events to Date:  11/25: Admitted to the FMTS with acute alcoholic pancreatitis.  Assessment and Plan: Timothy Martinez is a 64 y.o. male presenting with epigastric pain after alcohol use. PMH is significant for alcohol abuse and pancreatitis, COPD, DM, HLD, GERD, afib s/p cardioversion  Alcohol induced pancreatitis: 1-2 admissions a year for this. Describes pain as sharp and epigastric, typical of past episodes. BMP wnl, lipase elevated 1294, amylase elevated 782.  - Initially NPO, then transitioned to clears. Will start on heart healthy diet this morning. - Dilaudid '1mg'$  q3hr prn for pain control. Has not required any doses since yesterday around 6pm.  Hx Afib s/p cardioversion: stable. Currently in NSR, HRs ranging from 59-66. - Continue eliquis '5mg'$  BID  DM: stable, last A1c was well controlled at 6.3 on 02/13/15 - Continue metformin  HLD: stable. Lipid Panel (08/05/14): Chol 120, TG 105, HDL 27, LDL 72. - Continue atorvastatin  COPD: stable - Continue albuterol prn, breo (formulary equiv)  Tobacco abuse: continues to be 1.5ppd smoker - Nicotine patch prn - Smoking cessation counseling  FEN/GI: NS @ 175cc/hr / NPO with advance to clears likely later today Prophylaxis: on full dose eliquis  Disposition: Home likely today, if he can tolerate solid foods.  Subjective:  Pt states he feels great today. He is having no abdominal pain and feels back to normal. He would like to go home today.  Objective: Temp:  [97 F (36.1 C)-98.2 F (36.8 C)] 97.5 F (36.4 C) (11/26 0500) Pulse Rate:  [59-66] 59 (11/26 0500) Resp:  [16-18] 16 (11/26 0500) BP: (122-134)/(65-81) 127/76 mmHg (11/26  0500) SpO2:  [93 %-96 %] 96 % (11/26 0500) Weight:  [174 lb 1.6 oz (78.971 kg)] 174 lb 1.6 oz (78.971 kg) (11/25 2100) Physical Exam: General: NAD, laying in bed, alert and conversational. HEENT: [place]/AT, EOMI, MMM Neck: supple Cardiovascular: RRR, normal s1s2, no murmur appreciated, 2+ radial pulses bilaterally  Respiratory: clear to auscultation bilaterally, normal effort Abdomen: soft, mildly tender to palpation of the epigastrium, no rebound, no guarding. MSK: normal bulk/tone, no deformities Skin: no rashes Neuro: alert and oriented, no deficits noted Psych: normal mood and affect. Normal thought content and speech  Laboratory:  Recent Labs Lab 06/19/15 0149 06/20/15 0525  WBC 10.2 9.2  HGB 16.8 14.4  HCT 50.2 43.8  PLT 285 272    Recent Labs Lab 06/19/15 0149 06/20/15 0525  NA 139 137  K 4.9 4.0  CL 102 103  CO2 30 28  BUN <5* 6  CREATININE 0.79 0.75  CALCIUM 9.9 8.7*  PROT 7.4  --   BILITOT 0.6  --   ALKPHOS 60  --   ALT 12*  --   AST 20  --   GLUCOSE 143* 98   Lipase 1294 Amylase 782  Imaging/Diagnostic Tests: None  Sela Hua, MD 06/20/2015, 7:26 AM PGY-1, [place] Intern pager: [phone], text pages welcome

## 2015-06-21 NOTE — Discharge Summary (Signed)
Timothy Martinez  Patient name: Timothy Martinez Medical record number: [id number] Date of birth: May 24, 1951 Age: 64 y.o. Gender: male Date of Admission: 06/19/2015  Date of Discharge: 06/20/15 Admitting Physician: Dickie La, MD  Primary Care Provider: Phill Myron, MD Consultants: None  Indication for Hospitalization: Acute alcoholic pancreatitis  Discharge Diagnoses/Problem List:  Alcohol induced acute pancreatitis  DM HLD Alcohol abuse Tobacco abuse COPD Afib  Disposition: Home  Discharge Condition: Stable, improved  Discharge Exam:  General: NAD, laying in bed, alert and conversational. HEENT: [place]/AT, EOMI, MMM Neck: supple Cardiovascular: RRR, normal s1s2, no murmur appreciated, 2+ radial pulses bilaterally  Respiratory: clear to auscultation bilaterally, normal effort Abdomen: soft, mildly tender to palpation of the epigastrium, no rebound, no guarding. MSK: normal bulk/tone, no deformities Skin: no rashes Neuro: alert and oriented, no deficits noted Psych: normal mood and affect. Normal thought content and speech  Brief Hospital Course:  Timothy Martinez is a 64 year old male who presented to the ED with epigastric abdominal pain after drinking 4 beers with dinner. In the ED, he was found to have acute alcoholic pancreatitis with a lipase of 1294 and an amylase of 782. He was made NPO and treated with Tylenol and Dilaudid 4m q3hrs prn for pain control. He was hydrated with NS 175 cc/hr. His diet was slowly advanced to clear liquids and then solid foods, which he tolerated without any issues. On the day of discharge, his abdominal pain had markedly improved and his abdomen was only mildly tender to palpation. While hospitalized, he also received substance abuse counseling.   The rest of his medical conditions were stable and no changes were made to his medications.  Issues for Follow Up:  1. Recommend continued substance abuse  counseling.  Significant Procedures: None  Significant Labs and Imaging:   Recent Labs Lab 06/19/15 0149 06/20/15 0525  WBC 10.2 9.2  HGB 16.8 14.4  HCT 50.2 43.8  PLT 285 272    Recent Labs Lab 06/19/15 0149 06/20/15 0525  NA 139 137  K 4.9 4.0  CL 102 103  CO2 30 28  GLUCOSE 143* 98  BUN <5* 6  CREATININE 0.79 0.75  CALCIUM 9.9 8.7*  ALKPHOS 60  --   AST 20  --   ALT 12*  --   ALBUMIN 3.8  --    Lipase 1294 Amylase 782  Results/Tests Pending at Time of Discharge: None  Discharge Medications:    Medication List    TAKE these medications        albuterol 108 (90 BASE) MCG/ACT inhaler  Commonly known as:  PROVENTIL HFA;VENTOLIN HFA  Inhale 1-2 puffs into the lungs every 12 (twelve) hours as needed for wheezing or shortness of breath.     atorvastatin 40 MG tablet  Commonly known as:  LIPITOR  Take 1 tablet (40 mg total) by mouth daily at 6 PM.     cyanocobalamin 500 MCG tablet  Take 500 mcg by mouth daily.     ELIQUIS 5 MG Tabs tablet  Generic drug:  apixaban  TAKE 1 TABLET (5 MG TOTAL) BY MOUTH 2 (TWO) TIMES DAILY.     famotidine 40 MG tablet  Commonly known as:  PEPCID  TAKE 1 TABLET (40 MG TOTAL) BY MOUTH DAILY.     gabapentin 100 MG capsule  Commonly known as:  NEURONTIN  Take 100 mg by mouth 3 (three) times daily as needed (pain in legs).  glucose blood test strip  Commonly known as:  ONE TOUCH ULTRA TEST  Use check fasting blood glucose qAM. Type 2 Diabetes E11.9     metFORMIN 500 MG tablet  Commonly known as:  GLUCOPHAGE  TAKE 2 TABLETS (1,000 MG TOTAL) BY MOUTH 2 (TWO) TIMES DAILY WITH A MEAL.     Metoprolol Tartrate 75 MG Tabs  Take 75 mg by mouth 2 (two) times daily.     ONE TOUCH LANCETS Misc  Check fasting blood glucose qAM. Type 2 DM. E11.9     ONE TOUCH ULTRA 2 W/DEVICE Kit  Dispense one glucometer. Diagnosis Type 2 Diabetes. E11.9        Discharge Instructions: Please refer to Patient Instructions section of EMR  for full details.  Patient was counseled important signs and symptoms that should prompt return to medical care, changes in medications, dietary instructions, activity restrictions, and follow up appointments.   Follow-Up Appointments:     Follow-up Information    Follow up with Tawanna Sat, MD On 06/23/2015.   Specialty:  Family Medicine   Why:  Hospital follow-up visit at 2:30pm.   Contact information:   [place] [phone]       Sela Hua, MD 06/21/2015, 7:36 PM PGY-1, [place]

## 2015-06-23 ENCOUNTER — Ambulatory Visit (INDEPENDENT_AMBULATORY_CARE_PROVIDER_SITE_OTHER): Payer: Medicare HMO | Admitting: Family Medicine

## 2015-06-23 VITALS — BP 118/73 | HR 71 | Temp 98.1°F | Ht 75.0 in | Wt 176.4 lb

## 2015-06-23 DIAGNOSIS — K859 Acute pancreatitis without necrosis or infection, unspecified: Secondary | ICD-10-CM | POA: Diagnosis not present

## 2015-06-23 DIAGNOSIS — K860 Alcohol-induced chronic pancreatitis: Secondary | ICD-10-CM

## 2015-06-23 DIAGNOSIS — E1340 Other specified diabetes mellitus with diabetic neuropathy, unspecified: Secondary | ICD-10-CM

## 2015-06-23 DIAGNOSIS — K852 Alcohol induced acute pancreatitis without necrosis or infection: Secondary | ICD-10-CM | POA: Diagnosis not present

## 2015-06-23 DIAGNOSIS — K861 Other chronic pancreatitis: Secondary | ICD-10-CM

## 2015-06-23 LAB — POCT GLYCOSYLATED HEMOGLOBIN (HGB A1C): Hemoglobin A1C: 6.5

## 2015-06-23 MED ORDER — ACETAMINOPHEN ER 650 MG PO TBCR: 650.0000 mg | EXTENDED_RELEASE_TABLET | Freq: Three times a day (TID) | ORAL | Status: DC | PRN

## 2015-06-23 NOTE — Patient Instructions (Signed)
A1c today is 6.5.  Schedule an appointment with Dr. Berkley Harvey after Jan 1st

## 2015-06-23 NOTE — Progress Notes (Signed)
Patient ID: Timothy Martinez, male   DOB: 11/22/1950, 64 y.o.   MRN: [id number]   Subjective: CC:  Hospital follow-up HPI: This history was provided by the patient.  Patient's PMH significant for diabetes mellitus, dyslipidemia, esophageal reflux, COPD, atrial fibrillation, congestive heart failure, and alcohol-induced pancreatitis.  Patient is a 64 y.o. male presenting to clinic today for follow-up following hospitalization for pancreatitis on 06/19/15.  Patient's lipase was 1294 on admission to hospital.  He was discharged home on 11/26.  Since returning home, patient has had some right and left upper quadrant pain.  Patient states LUQ pain radiates to left flank and is worse when he is laying flat.  Patient denies fever, chills, and N/V/D since hospital discharge.  He has tolerated foods/liquids, had a normal BM yesterday. Patient denies urinary symptoms such as hematuria, dysuria, frequency/urgency and melena or hematochezia.  He requests tylenol since this helped him a lot with his pain in the hospital.  Patient states he is trying to quit drinking and prior to 11/25 had not had a drink for 2-3 months.  Patient states he is trying to avoid friends who drink alcohol and is interested in attending [place] meetings to support his cessation efforts.  Concerns today include:  1. Abdominal pain  Social History Reviewed: current every day smoker. FamHx and MedHx updated.  Please see EMR. Health Maintenance: Influenza vaccination declined by patient   ROS: Per HPI  Objective: Office vital signs reviewed. BP 118/73 mmHg  Pulse 71  Temp(Src) 98.1 F (36.7 C) (Oral)  Ht '6\' 3"'$  (1.905 m)  Wt 176 lb 6 oz (80.003 kg)  BMI 22.05 kg/m2  SpO2 98%  Physical Examination:  General: Awake, alert, well-nourished, NAD Cardio: RRR, S1S2 heard, no murmurs appreciated, no lower extremity or edema, cyanosis or clubbing; +2 radial pulses bilaterally Pulm: Coarse lung sounds, CTAB, no wheezes, rhonchi or rales GI:  Soft, no distension, tender to palpation LU and RU quadrants,+BS x4, no hepatomegaly, no splenomegaly. Well healed scar bordering his lower costal margin anteriorly with some palpable scar tissue appreciated Neuro: Strength and sensation grossly intact  Assessment/ Plan: 64 y.o. male who presents for follow-up after hospitalization for acute pancreatitis last weekend.  Patient is doing well, but states he continues to have some abdominal pain, worse LUQ.  Patient denies N/V and fever.  Will prescribe Tylenol for pain control.  Patient received this in the hospital and states it helped his pain.  Patient states he is trying to stop using alcohol, but is not interested in inpatient treatment.  Patient expresses interest in outpatient support such as AA meetings.  Patient has history of diabetes type 2, and his A1C is 6.5% today.  So DM seems to be well-controlled at this time.  1. Other specified diabetes mellitus with diabetic neuropathy (HCC) - POCT A1C  2.  Follow-up post hospitalization for alcohol related pancreatitis - Patient is doing well with some residual pain, but no N/V.  Will prescribe Tylenol for pain relief. - Patient states he is going to follow-up cessation efforts by attending [place] meetings near his home.  Above plan discussed with patient and he expresses understanding and agreement.  Follow-up: Patient to follow-up with PCP in January for follow-up regarding DM and alcoholism.   Sherron Ales, NP Student [facility]   RESIDENT ADDENDUM I have separately seen and examined the patient. I have discussed the findings and exam with the NP student Ms. Owens Shark and agree with the above note, my  changes are noted in blue.  Chronic alcoholic pancreatitis [facility]) Short hospital stay after relapse of alcohol use on Thanksgiving. Patient reports improved symptoms but still has some abdominal pain. Rx sent for tylenol '650mg'$ . Recommended AA which patient seems amenable  to.  Diabetes mellitus ([place]) A1c done today which is well controlled at 6.5. Recommended he follow up with PCP for this since he is due in about 4-6 weeks for 6 month follow up.    Tawanna Sat, MD 06/23/2015, 4:10 PM PGY-3, [place]

## 2015-06-23 NOTE — Assessment & Plan Note (Signed)
Short hospital stay after relapse of alcohol use on Thanksgiving. Patient reports improved symptoms but still has some abdominal pain. Rx sent for tylenol '650mg'$ . Recommended AA which patient seems amenable to.

## 2015-06-23 NOTE — Assessment & Plan Note (Signed)
A1c done today which is well controlled at 6.5. Recommended he follow up with PCP for this since he is due in about 4-6 weeks for 6 month follow up.

## 2015-07-03 ENCOUNTER — Other Ambulatory Visit: Payer: Self-pay | Admitting: Family Medicine

## 2015-07-07 ENCOUNTER — Other Ambulatory Visit: Payer: Self-pay | Admitting: *Deleted

## 2015-07-07 NOTE — Telephone Encounter (Signed)
Second refill request received for famotidine via fax from [facility]. Zamir Staples, Orvis Brill, RN

## 2015-07-08 MED ORDER — FAMOTIDINE 40 MG PO TABS: ORAL_TABLET | ORAL | Status: DC

## 2015-08-12 ENCOUNTER — Encounter: Payer: Self-pay | Admitting: Nurse Practitioner

## 2015-08-12 ENCOUNTER — Ambulatory Visit (INDEPENDENT_AMBULATORY_CARE_PROVIDER_SITE_OTHER): Payer: Medicaid Other | Admitting: Cardiovascular Disease

## 2015-08-12 ENCOUNTER — Encounter: Payer: Self-pay | Admitting: Cardiovascular Disease

## 2015-08-12 VITALS — BP 118/60 | HR 61 | Ht 75.0 in | Wt 182.4 lb

## 2015-08-12 DIAGNOSIS — I4891 Unspecified atrial fibrillation: Secondary | ICD-10-CM | POA: Diagnosis not present

## 2015-08-12 DIAGNOSIS — I480 Paroxysmal atrial fibrillation: Secondary | ICD-10-CM

## 2015-08-12 NOTE — Progress Notes (Signed)
Cardiology Office Note   Date:  08/12/2015   ID:  Timothy Martinez, DOB 1950-08-02, MRN [id number]  PCP:  Phill Myron, MD  Cardiologist:   Acie Fredrickson Wonda Cheng, MD   Chief Complaint  Patient presents with  . Follow-up    paroxysmal atrial fib and diastolic CHF    Problem List: 1. Atrial fibrillation - paroxysmal  2. Chronic diastolic  CHF 3. COPD   Timothy Martinez is a 65 y.o. male who presents for  Follow up of atrial flutter / fib . I met him in the hospital with atrial flutter when he was admitted with acute pancreatitis.     Feb. 26, 2016:  Timothy Martinez is a 65 y.o. male who presents for follow up of his atrial fib and chronic systolic CHF.   HR is still irreg.   Still short of breath. Still smoking , has cut back  Has not missed any doses of Eliuquis  Sept. 2, 2016:    Doing well  has paroxysmal Afib.    Jan. 18, 2017:  Doing well.  Still in NSR .   BP and HR look great. No Cp or dyspnea.  Has cut back on his smoking - down from 3 ppd to 1 ppd. He is scheduled to have his 7 lower teeth extracted next week. He is at low risk for complications.   He will need to hold 3 doses of his Eliquis.    Past Medical History  Diagnosis Date  . Diabetes mellitus   . Hyperlipidemia   . Shortness of breath     with ambulation  . Anginal pain ([place])   . Pneumonia   . GERD (gastroesophageal reflux disease)   . Headache(784.0)   . Arthritis   . Alcoholic pancreatitis   . COPD (chronic obstructive pulmonary disease) ([place])   . Atrial fibrillation status post cardioversion ([place]) 08/2014  . CHF (congestive heart failure) ([place])   . Alcohol abuse   . Tobacco use disorder, continuous   . Pancreatic pseudocyst     seen on CT scan 07/2014    Past Surgical History  Procedure Laterality Date  . Back surgery    . Neck surgery      not cervical, states had lesion on neck which was removed  . Splenectomy    . Cardioversion N/A 09/22/2014    Procedure: CARDIOVERSION;   Surgeon: Thayer Headings, MD;  Location: [facility] ENDOSCOPY;  Service: Cardiovascular;  Laterality: N/A;     Current Outpatient Prescriptions  Medication Sig Dispense Refill  . acetaminophen (TYLENOL ARTHRITIS PAIN) 650 MG CR tablet Take 1 tablet (650 mg total) by mouth every 8 (eight) hours as needed for pain. 30 tablet 2  . albuterol (PROVENTIL HFA;VENTOLIN HFA) 108 (90 BASE) MCG/ACT inhaler Inhale 1-2 puffs into the lungs every 12 (twelve) hours as needed for wheezing or shortness of breath. 18 Inhaler 12  . atorvastatin (LIPITOR) 40 MG tablet Take 1 tablet (40 mg total) by mouth daily at 6 PM. 90 tablet 3  . Blood Glucose Monitoring Suppl (ONE TOUCH ULTRA 2) W/DEVICE KIT Dispense one glucometer. Diagnosis Type 2 Diabetes. E11.9 1 each 0  . cyanocobalamin 500 MCG tablet Take 500 mcg by mouth daily.    Marland Kitchen ELIQUIS 5 MG TABS tablet TAKE 1 TABLET (5 MG TOTAL) BY MOUTH 2 (TWO) TIMES DAILY. 60 tablet 5  . famotidine (PEPCID) 40 MG tablet TAKE 1 TABLET (40 MG TOTAL) BY MOUTH DAILY. 30 tablet 3  .  glucose blood (ONE TOUCH ULTRA TEST) test strip Use check fasting blood glucose qAM. Type 2 Diabetes E11.9 100 each 3  . metFORMIN (GLUCOPHAGE) 500 MG tablet TAKE 2 TABLETS (1,000 MG TOTAL) BY MOUTH 2 (TWO) TIMES DAILY WITH A MEAL. 360 tablet 1  . metoprolol 75 MG TABS Take 75 mg by mouth 2 (two) times daily. 60 tablet 11  . ONE TOUCH LANCETS MISC Check fasting blood glucose qAM. Type 2 DM. E11.9 200 each 1  . gabapentin (NEURONTIN) 100 MG capsule Take 100 mg by mouth 3 (three) times daily as needed (pain in legs). Reported on 08/12/2015     No current facility-administered medications for this visit.    Allergies:   Tramadol; Prozac; Morphine and related; and Penicillins    Social History:  The patient  reports that he has been smoking Cigarettes.  He started smoking about 51 years ago. He has a 750 pack-year smoking history. He has never used smokeless tobacco. He reports that he drinks alcohol. He reports  that he does not use illicit drugs.   Family History:  The patient's family history includes Alcohol abuse in his father; Aneurysm in his mother; Diabetes in his mother; Hypertension in his mother; Stroke in his mother. There is no history of Heart attack.    ROS:  Please see the history of present illness.    Review of Systems: Constitutional:  denies fever, chills, diaphoresis, appetite change and fatigue.  HEENT: denies photophobia, eye pain, redness, hearing loss, ear pain, congestion, sore throat, rhinorrhea, sneezing, neck pain, neck stiffness and tinnitus.  Respiratory: denies SOB, DOE, cough, chest tightness, and wheezing.  Cardiovascular: denies chest pain, palpitations and leg swelling.  Gastrointestinal: denies nausea, vomiting, abdominal pain, diarrhea, constipation, blood in stool.  Genitourinary: denies dysuria, urgency, frequency, hematuria, flank pain and difficulty urinating.  Musculoskeletal: denies  myalgias, back pain, joint swelling, arthralgias and gait problem.   Skin: denies pallor, rash and wound.  Neurological: denies dizziness, seizures, syncope, weakness, light-headedness, numbness and headaches.   Hematological: denies adenopathy, easy bruising, personal or family bleeding history.  Psychiatric/ Behavioral: denies suicidal ideation, mood changes, confusion, nervousness, sleep disturbance and agitation.       All other systems are reviewed and negative.    PHYSICAL EXAM: VS:  BP 118/60 mmHg  Pulse 61  Ht '6\' 3"'  (1.905 m)  Wt 182 lb 6.4 oz (82.736 kg)  BMI 22.80 kg/m2 , BMI Body mass index is 22.8 kg/(m^2). GEN: Well nourished, well developed, in no acute distress HEENT: normal Neck: no JVD, carotid bruits, or masses Cardiac: RR  no murmurs, rubs, or gallops,no edema  Respiratory:  clear to auscultation bilaterally, normal work of breathing GI: soft, nontender, nondistended, + BS MS: no deformity or atrophy Skin: warm and dry, no rash Neuro:   Strength and sensation are intact Psych: normal   EKG:  EKG is ordered today. NSR at 61.   Normal ECG    Recent Labs: 06/19/2015: ALT 12* 06/20/2015: BUN 6; Creatinine, Ser 0.75; Hemoglobin 14.4; Platelets 272; Potassium 4.0; Sodium 137    Lipid Panel    Component Value Date/Time   CHOL 120 08/05/2014 0845   TRIG 105 08/05/2014 0845   HDL 27* 08/05/2014 0845   CHOLHDL 4.4 08/05/2014 0845   VLDL 21 08/05/2014 0845   LDLCALC 72 08/05/2014 0845      Wt Readings from Last 3 Encounters:  08/12/15 182 lb 6.4 oz (82.736 kg)  06/23/15 176 lb 6 oz (80.003 kg)  06/19/15 174 lb 1.6 oz (78.971 kg)      Other studies Reviewed: Additional studies/ records that were reviewed today include: . Review of the above records demonstrates:    ASSESSMENT AND PLAN:  1. Atrial fibrillation: The patient was successfully cardioverted .   He remains in normal sinus rhythm. Continue Eliquis for now. He has dental extractions scheduled for Monday .   I think that  He is at low risk for his upcoming dental extraction. He should hold 3 doses of request prior to the procedure. He should restart the [facility] with [place] the day after dental extraction assuming that his bleeding is well controlled.  2. Chronic diastolic  congestive heart failure: His blood pressures well-controlled. We will continue with the current dose of metoprolol.       3. COPD:  Cigarette smoking: I've encouraged him to stop smoking. He has COPD.  Current medicines are reviewed at length with the patient today.  The patient does not have concerns regarding medicines.  The following changes have been made:     Disposition:   FU with me in 6 months.    Signed, Thadeus Gandolfi, Wonda Cheng, MD  08/12/2015 12:02 PM    [address], [place], [place]  [address] Phone: [phone]; Fax: [phone]

## 2015-08-12 NOTE — Patient Instructions (Signed)
Medication Instructions:  Your physician recommends that you continue on your current medications as directed. Please refer to the Current Medication list given to you today.   Labwork: None Ordered   Testing/Procedures: None Ordered   Follow-Up: Your physician wants you to follow-up in: 6 months with Dr. Nahser.  You will receive a reminder letter in the mail two months in advance. If you don't receive a letter, please call our office to schedule the follow-up appointment.   If you need a refill on your cardiac medications before your next appointment, please call your pharmacy.   Thank you for choosing [facility]! Wilian Kwong, RN [phone]    

## 2015-08-21 ENCOUNTER — Telehealth: Payer: Self-pay | Admitting: Nurse Practitioner

## 2015-08-21 MED ORDER — RIVAROXABAN 20 MG PO TABS: 20.0000 mg | ORAL_TABLET | Freq: Every day | ORAL | Status: DC

## 2015-08-21 NOTE — Telephone Encounter (Signed)
-----   Message from Emmaline Life, RN sent at 08/13/2015  2:37 PM EST ----- Change to Xarelto 20 mg

## 2015-08-21 NOTE — Telephone Encounter (Signed)
Due to problems with the cost of eliquis with patient's insurance, Dr. Acie Fredrickson advises we change the patient to Xarelto 20 mg once daily.  I explained the reason for the change to the patient.  He was in the office last week for clearance for oral surgery.  He has been advised to hold his blood thinner for 3 doses prior to procedure.  I explained that he could finish the remaining eliquis Rx that he already has, which he states will run out on Tuesday, and start Xarelto on Friday, the day after the procedure.  He verbalized understanding and agreement and thanked me for the call.

## 2015-09-04 ENCOUNTER — Other Ambulatory Visit: Payer: Self-pay | Admitting: Family Medicine

## 2015-10-05 ENCOUNTER — Telehealth: Payer: Self-pay

## 2015-10-05 NOTE — Telephone Encounter (Signed)
Prior auth for Xarelto '20mg'$  sent to [facility].

## 2015-10-20 ENCOUNTER — Ambulatory Visit: Payer: Medicare HMO | Admitting: Cardiovascular Disease

## 2015-11-14 ENCOUNTER — Other Ambulatory Visit: Payer: Self-pay | Admitting: Family Medicine

## 2015-11-30 ENCOUNTER — Other Ambulatory Visit: Payer: Self-pay | Admitting: *Deleted

## 2015-11-30 DIAGNOSIS — J449 Chronic obstructive pulmonary disease, unspecified: Secondary | ICD-10-CM

## 2015-11-30 MED ORDER — ALBUTEROL SULFATE HFA 108 (90 BASE) MCG/ACT IN AERS: 1.0000 | INHALATION_SPRAY | Freq: Two times a day (BID) | RESPIRATORY_TRACT | Status: DC | PRN

## 2016-01-28 ENCOUNTER — Ambulatory Visit (INDEPENDENT_AMBULATORY_CARE_PROVIDER_SITE_OTHER): Payer: Medicaid Other | Admitting: Cardiovascular Disease

## 2016-01-28 ENCOUNTER — Encounter: Payer: Self-pay | Admitting: Cardiovascular Disease

## 2016-01-28 VITALS — BP 110/56 | HR 64 | Ht 75.0 in | Wt 180.0 lb

## 2016-01-28 DIAGNOSIS — I4891 Unspecified atrial fibrillation: Secondary | ICD-10-CM

## 2016-01-28 MED ORDER — ATORVASTATIN CALCIUM 40 MG PO TABS: 40.0000 mg | ORAL_TABLET | Freq: Every day | ORAL | Status: DC

## 2016-01-28 MED ORDER — RIVAROXABAN 20 MG PO TABS: 20.0000 mg | ORAL_TABLET | Freq: Every day | ORAL | Status: DC

## 2016-01-28 MED ORDER — METOPROLOL TARTRATE 50 MG PO TABS: 75.0000 mg | ORAL_TABLET | Freq: Two times a day (BID) | ORAL | Status: DC

## 2016-01-28 NOTE — Patient Instructions (Addendum)
Medication Instructions:  Your physician recommends that you continue on your current medications as directed. Please refer to the Current Medication list given to you today. [facility]  Labwork: None Ordered   Testing/Procedures: None Ordered   Follow-Up: Your physician wants you to follow-up in: 1 year with Dr. Acie Fredrickson.  You will receive a reminder letter in the mail two months in advance. If you don't receive a letter, please call our office to schedule the follow-up appointment.   If you need a refill on your cardiac medications before your next appointment, please call your pharmacy.   Thank you for choosing [facility]! Christen Bame, RN [phone]

## 2016-01-28 NOTE — Progress Notes (Signed)
Cardiology Office Note   Date:  01/28/2016   ID:  Timothy Martinez, DOB [birth year], MRN [id number]  PCP:  Dimas Chyle, MD  Cardiologist:   Mertie Moores, MD   No chief complaint on file.   Problem List: 1. Atrial fibrillation - paroxysmal  2. Chronic diastolic  CHF 3. COPD   Timothy Martinez is a 65 y.o. male who presents for  Follow up of atrial flutter / fib . I met him in the hospital with atrial flutter when he was admitted with acute pancreatitis.     Feb. 26, 2016:  Timothy Martinez is a 65 y.o. male who presents for follow up of his atrial fib and chronic systolic CHF.   HR is still irreg.   Still short of breath. Still smoking , has cut back  Has not missed any doses of Eliuquis  Sept. 2, 2016:    Doing well  has paroxysmal Afib.    Jan. 18, 2017:  Doing well.  Still in NSR .   BP and HR look great. No Cp or dyspnea.  Has cut back on his smoking - down from 3 ppd to 1 ppd. He is scheduled to have his 7 lower teeth extracted next week. He is at low risk for complications.   He will need to hold 3 doses of his Eliquis.   January 28, 2016: Timothy Martinez is doing well except he has weakness. Still smoking - 10 cigarettes a day  No CP , no dyspnea. Still very active - mows 4 hours a day .     Past Medical History  Diagnosis Date  . Diabetes mellitus   . Hyperlipidemia   . Shortness of breath     with ambulation  . Anginal pain ([place])   . Pneumonia   . GERD (gastroesophageal reflux disease)   . Headache(784.0)   . Arthritis   . Alcoholic pancreatitis   . COPD (chronic obstructive pulmonary disease) ([place])   . Atrial fibrillation status post cardioversion ([place]) 08/2014  . CHF (congestive heart failure) ([place])   . Alcohol abuse   . Tobacco use disorder, continuous   . Pancreatic pseudocyst     seen on CT scan 07/2014    Past Surgical History  Procedure Laterality Date  . Back surgery    . Neck surgery      not cervical, states had lesion on neck which was  removed  . Splenectomy    . Cardioversion N/A 09/22/2014    Procedure: CARDIOVERSION;  Surgeon: Thayer Headings, MD;  Location: [facility] ENDOSCOPY;  Service: Cardiovascular;  Laterality: N/A;     Current Outpatient Prescriptions  Medication Sig Dispense Refill  . acetaminophen (TYLENOL ARTHRITIS PAIN) 650 MG CR tablet Take 1 tablet (650 mg total) by mouth every 8 (eight) hours as needed for pain. 30 tablet 2  . albuterol (PROVENTIL HFA;VENTOLIN HFA) 108 (90 Base) MCG/ACT inhaler Inhale 1-2 puffs into the lungs every 12 (twelve) hours as needed for wheezing or shortness of breath. 1 Inhaler 3  . atorvastatin (LIPITOR) 40 MG tablet TAKE 1 TABLET (40 MG TOTAL) BY MOUTH DAILY AT 6 PM. 90 tablet 3  . Blood Glucose Monitoring Suppl (ONE TOUCH ULTRA 2) W/DEVICE KIT Dispense one glucometer. Diagnosis Type 2 Diabetes. E11.9 1 each 0  . cyanocobalamin 500 MCG tablet Take 500 mcg by mouth daily.    . famotidine (PEPCID) 40 MG tablet TAKE 1 TABLET (40 MG TOTAL) BY MOUTH DAILY. 30 tablet 3  .  gabapentin (NEURONTIN) 100 MG capsule Take 100 mg by mouth 3 (three) times daily as needed (pain in legs). Reported on 08/12/2015    . glucose blood (ONE TOUCH ULTRA TEST) test strip Use check fasting blood glucose qAM. Type 2 Diabetes E11.9 100 each 3  . metFORMIN (GLUCOPHAGE) 500 MG tablet TAKE 2 TABLETS BY MOUTH 2 TIMES DAILY WITH A MEAL. 360 tablet 1  . metoprolol 75 MG TABS Take 75 mg by mouth 2 (two) times daily. 60 tablet 11  . ONE TOUCH LANCETS MISC Check fasting blood glucose qAM. Type 2 DM. E11.9 200 each 1  . rivaroxaban (XARELTO) 20 MG TABS tablet Take 1 tablet (20 mg total) by mouth daily with supper. 30 tablet 11   No current facility-administered medications for this visit.    Allergies:   Tramadol; Prozac; Morphine and related; and Penicillins    Social History:  The patient  reports that he has been smoking Cigarettes.  He started smoking about 51 years ago. He has a 750 pack-year smoking history. He has  never used smokeless tobacco. He reports that he drinks alcohol. He reports that he does not use illicit drugs.   Family History:  The patient's family history includes Alcohol abuse in his father; Aneurysm in his mother; Diabetes in his mother; Hypertension in his mother; Stroke in his mother. There is no history of Heart attack.    ROS:  Please see the history of present illness.    Review of Systems: Constitutional:  denies fever, chills, diaphoresis, appetite change and fatigue.  HEENT: denies photophobia, eye pain, redness, hearing loss, ear pain, congestion, sore throat, rhinorrhea, sneezing, neck pain, neck stiffness and tinnitus.  Respiratory: denies SOB, DOE, cough, chest tightness, and wheezing.  Cardiovascular: denies chest pain, palpitations and leg swelling.  Gastrointestinal: denies nausea, vomiting, abdominal pain, diarrhea, constipation, blood in stool.  Genitourinary: denies dysuria, urgency, frequency, hematuria, flank pain and difficulty urinating.  Musculoskeletal: denies  myalgias, back pain, joint swelling, arthralgias and gait problem.   Skin: denies pallor, rash and wound.  Neurological: denies dizziness, seizures, syncope, weakness, light-headedness, numbness and headaches.   Hematological: denies adenopathy, easy bruising, personal or family bleeding history.  Psychiatric/ Behavioral: denies suicidal ideation, mood changes, confusion, nervousness, sleep disturbance and agitation.       All other systems are reviewed and negative.    PHYSICAL EXAM: VS:  BP 110/56 mmHg  Pulse 64  Ht '6\' 3"'  (1.905 m)  Wt 180 lb (81.647 kg)  BMI 22.50 kg/m2  SpO2 97% , BMI Body mass index is 22.5 kg/(m^2). GEN: Well nourished, well developed, in no acute distress HEENT: normal Neck: no JVD, carotid bruits, or masses Cardiac: RR  no murmurs, rubs, or gallops,no edema  Respiratory:  clear to auscultation bilaterally, normal work of breathing GI: soft, nontender, nondistended,  + BS MS: no deformity or atrophy Skin: warm and dry, no rash Neuro:  Strength and sensation are intact Psych: normal   EKG:  EKG is ordered today. NSR at 61.   Normal ECG    Recent Labs: 06/19/2015: ALT 12* 06/20/2015: BUN 6; Creatinine, Ser 0.75; Hemoglobin 14.4; Platelets 272; Potassium 4.0; Sodium 137    Lipid Panel    Component Value Date/Time   CHOL 120 08/05/2014 0845   TRIG 105 08/05/2014 0845   HDL 27* 08/05/2014 0845   CHOLHDL 4.4 08/05/2014 0845   VLDL 21 08/05/2014 0845   LDLCALC 72 08/05/2014 0845      Wt Readings from  Last 3 Encounters:  01/28/16 180 lb (81.647 kg)  08/12/15 182 lb 6.4 oz (82.736 kg)  06/23/15 176 lb 6 oz (80.003 kg)      Other studies Reviewed: Additional studies/ records that were reviewed today include: . Review of the above records demonstrates:    ASSESSMENT AND PLAN:  1. Atrial fibrillation: The patient has maintained NSR  2. Chronic diastolic  congestive heart failure: His blood pressures well-controlled. We will continue with the current dose of metoprolol.       3. COPD:  Cigarette smoking: I've encouraged him to stop smoking. He has COPD.  Current medicines are reviewed at length with the patient today.  The patient does not have concerns regarding medicines.  The following changes have been made:     Disposition:   FU with me in 12  months.    Signed, Mertie Moores, MD  01/28/2016 8:34 AM    [address], [place], [place]  [address] Phone: [phone]; Fax: [phone]

## 2016-02-11 ENCOUNTER — Ambulatory Visit (INDEPENDENT_AMBULATORY_CARE_PROVIDER_SITE_OTHER): Payer: Medicaid Other | Admitting: Family Medicine

## 2016-02-11 ENCOUNTER — Encounter: Payer: Self-pay | Admitting: Family Medicine

## 2016-02-11 VITALS — BP 135/72 | HR 74 | Temp 97.8°F | Ht 75.0 in | Wt 180.0 lb

## 2016-02-11 DIAGNOSIS — I10 Essential (primary) hypertension: Secondary | ICD-10-CM

## 2016-02-11 DIAGNOSIS — K219 Gastro-esophageal reflux disease without esophagitis: Secondary | ICD-10-CM | POA: Diagnosis not present

## 2016-02-11 DIAGNOSIS — F5102 Adjustment insomnia: Secondary | ICD-10-CM | POA: Insufficient documentation

## 2016-02-11 DIAGNOSIS — E785 Hyperlipidemia, unspecified: Secondary | ICD-10-CM

## 2016-02-11 DIAGNOSIS — E119 Type 2 diabetes mellitus without complications: Secondary | ICD-10-CM

## 2016-02-11 DIAGNOSIS — G4700 Insomnia, unspecified: Secondary | ICD-10-CM

## 2016-02-11 DIAGNOSIS — Z114 Encounter for screening for human immunodeficiency virus [HIV]: Secondary | ICD-10-CM | POA: Diagnosis not present

## 2016-02-11 HISTORY — DX: Insomnia, unspecified: G47.00

## 2016-02-11 LAB — CBC
HCT: 41.4 % (ref 38.5–50.0)
Hemoglobin: 14.1 g/dL (ref 13.2–17.1)
MCH: 31.5 pg (ref 27.0–33.0)
MCHC: 34.1 g/dL (ref 32.0–36.0)
MCV: 92.6 fL (ref 80.0–100.0)
MPV: 9.7 fL (ref 7.5–12.5)
Platelets: 249 10*3/uL (ref 140–400)
RBC: 4.47 MIL/uL (ref 4.20–5.80)
RDW: 14.4 % (ref 11.0–15.0)
WBC: 8 10*3/uL (ref 3.8–10.8)

## 2016-02-11 LAB — POCT GLYCOSYLATED HEMOGLOBIN (HGB A1C): Hemoglobin A1C: 6

## 2016-02-11 MED ORDER — METFORMIN HCL 1000 MG PO TABS: 1000.0000 mg | ORAL_TABLET | Freq: Two times a day (BID) | ORAL | Status: DC

## 2016-02-11 MED ORDER — FAMOTIDINE 40 MG PO TABS: ORAL_TABLET | ORAL | Status: DC

## 2016-02-11 NOTE — Assessment & Plan Note (Signed)
Check lipid panel today. Continue lipitor. Not on aspirin as he is on xarelto for afib.

## 2016-02-11 NOTE — Assessment & Plan Note (Signed)
Stable. Continue famotidine.

## 2016-02-11 NOTE — Progress Notes (Signed)
   Subjective:  Timothy Martinez is a 65 y.o. male who presents to the [facility] today with a chief complaint of T2DM.   HPI:  T2DM Doing well on metformin without side effects. No diarrhea. No poluria or polydipsia. No fevers or chills.  GERD Doing well. Taking famotidine 3 times a week. No weight loss or early satiety.   HLD Tolerating atorvastatin without side effects.  Insomnia Patient with difficulty staying asleep for the past year. Has not tried any medications or OTC supplements. IS able to fall asleep normally. Drinks a "pot" of coffee in the morning. Falls asleep in bed with the TV on. Does not drink caffeine after around 3pm.  ROS: Per HPI  PMH: Smoking history reviewed.    Objective:  Physical Exam: BP 135/72 mmHg  Pulse 74  Temp(Src) 97.8 F (36.6 C) (Oral)  Ht '6\' 3"'$  (1.905 m)  Wt 180 lb (81.647 kg)  BMI 22.50 kg/m2  Gen: NAD, resting comfortably CV: RRR with no murmurs appreciated Pulm: NWOB, CTAB with no crackles, wheezes, or rhonchi GI: Normal bowel sounds present. Soft, Nontender, Nondistended. MSK: no edema, cyanosis, or clubbing noted Skin: warm, dry Neuro: grossly normal, moves all extremities Psych: Normal affect and thought content  Results for orders placed or performed in visit on 02/11/16 (from the past 72 hour(s))  POCT A1C     Status: None   Collection Time: 02/11/16  2:39 PM  Result Value Ref Range   Hemoglobin A1C 6.0      Assessment/Plan:  Diabetes mellitus ([place]) A1c at goal today. Continue metformin.   GERD (gastroesophageal reflux disease) Stable. Continue famotidine.   Hyperlipidemia Check lipid panel today. Continue lipitor. Not on aspirin as he is on xarelto for afib.   Insomnia Likely due to poor sleep hygiene habits. Discussed with patient ways to improve this - avoiding caffiene and alcohol, no TV in bed, not TV 30 minutes to an hour before bed, etc.   Algis Greenhouse. Jerline Pain, [place] [facility] Resident  PGY-3 02/11/2016 5:24 PM

## 2016-02-11 NOTE — Assessment & Plan Note (Signed)
A1c at goal today. Continue metformin.

## 2016-02-11 NOTE — Patient Instructions (Addendum)
Please stop the gabapentin.  Please cut down on your caffiene use. Please try to not watch TV while in bed. Your bed should be only used for sleeping. If you are in bed for more than 10-15 minutes before falling asleep, get out of bed and do something else until you are sleepy. You can try melatonin to help you sleep.  We will check blood work today.  If you need any help with stopping smoking, please let us know.  Please come back in 3-6 months or sooner if you need anything else.  Take care,  Dr Jerline Pain

## 2016-02-11 NOTE — Assessment & Plan Note (Signed)
Likely due to poor sleep hygiene habits. Discussed with patient ways to improve this - avoiding caffiene and alcohol, no TV in bed, not TV 30 minutes to an hour before bed, etc.

## 2016-02-12 ENCOUNTER — Encounter: Payer: Self-pay | Admitting: Family Medicine

## 2016-02-12 LAB — LIPID PANEL
Cholesterol: 89 mg/dL — ABNORMAL LOW (ref 125–200)
HDL: 53 mg/dL (ref >=40)
LDL Cholesterol: 24 mg/dL (ref <130)
Total CHOL/HDL Ratio: 1.7 Ratio (ref <=5.0)
Triglycerides: 59 mg/dL (ref <150)
VLDL: 12 mg/dL (ref <30)

## 2016-02-12 LAB — BASIC METABOLIC PANEL WITH GFR
BUN: 7 mg/dL (ref 7–25)
CO2: 27 mmol/L (ref 20–31)
Calcium: 9.6 mg/dL (ref 8.6–10.3)
Chloride: 104 mmol/L (ref 98–110)
Creat: 0.81 mg/dL (ref 0.70–1.25)
GFR, Est African American: >89 mL/min (ref >=60)
GFR, Est Non African American: >89 mL/min (ref >=60)
Glucose, Bld: 102 mg/dL — ABNORMAL HIGH (ref 65–99)
Potassium: 5.2 mmol/L (ref 3.5–5.3)
Sodium: 140 mmol/L (ref 135–146)

## 2016-02-12 LAB — HIV ANTIBODY (ROUTINE TESTING W REFLEX): HIV 1&2 Ab, 4th Generation: NONREACTIVE

## 2016-02-23 ENCOUNTER — Emergency Department (HOSPITAL_COMMUNITY)
Admission: EM | Admit: 2016-02-23 | Discharge: 2016-02-23 | Disposition: A | Discharge status: Home / Self Care | Payer: Medicare Other | Attending: Emergency Medicine | Admitting: Emergency Medicine

## 2016-02-23 ENCOUNTER — Encounter (HOSPITAL_COMMUNITY): Payer: Self-pay | Admitting: Emergency Medicine

## 2016-02-23 DIAGNOSIS — I509 Heart failure, unspecified: Secondary | ICD-10-CM | POA: Diagnosis not present

## 2016-02-23 DIAGNOSIS — E119 Type 2 diabetes mellitus without complications: Secondary | ICD-10-CM | POA: Diagnosis not present

## 2016-02-23 DIAGNOSIS — F17210 Nicotine dependence, cigarettes, uncomplicated: Secondary | ICD-10-CM | POA: Diagnosis not present

## 2016-02-23 DIAGNOSIS — K852 Alcohol induced acute pancreatitis without necrosis or infection: Secondary | ICD-10-CM

## 2016-02-23 DIAGNOSIS — Z7901 Long term (current) use of anticoagulants: Secondary | ICD-10-CM | POA: Insufficient documentation

## 2016-02-23 DIAGNOSIS — R1013 Epigastric pain: Secondary | ICD-10-CM | POA: Diagnosis present

## 2016-02-23 DIAGNOSIS — J449 Chronic obstructive pulmonary disease, unspecified: Secondary | ICD-10-CM | POA: Insufficient documentation

## 2016-02-23 DIAGNOSIS — Z7984 Long term (current) use of oral hypoglycemic drugs: Secondary | ICD-10-CM | POA: Insufficient documentation

## 2016-02-23 LAB — COMPREHENSIVE METABOLIC PANEL
ALT: 16 U/L — ABNORMAL LOW (ref 17–63)
AST: 25 U/L (ref 15–41)
Albumin: 3.7 g/dL (ref 3.5–5.0)
Alkaline Phosphatase: 55 U/L (ref 38–126)
Anion gap: 4 — ABNORMAL LOW (ref 5–15)
BUN: 9 mg/dL (ref 6–20)
CO2: 25 mmol/L (ref 22–32)
Calcium: 9.4 mg/dL (ref 8.9–10.3)
Chloride: 107 mmol/L (ref 101–111)
Creatinine, Ser: 0.72 mg/dL (ref 0.61–1.24)
GFR calc Af Amer: >60 mL/min (ref >60)
GFR calc non Af Amer: >60 mL/min (ref >60)
Glucose, Bld: 148 mg/dL — ABNORMAL HIGH (ref 65–99)
Potassium: 4.8 mmol/L (ref 3.5–5.1)
Sodium: 136 mmol/L (ref 135–145)
Total Bilirubin: 0.5 mg/dL (ref 0.3–1.2)
Total Protein: 7.1 g/dL (ref 6.5–8.1)

## 2016-02-23 LAB — CBC
HCT: 45 % (ref 39.0–52.0)
Hemoglobin: 14.7 g/dL (ref 13.0–17.0)
MCH: 31.5 pg (ref 26.0–34.0)
MCHC: 32.7 g/dL (ref 30.0–36.0)
MCV: 96.4 fL (ref 78.0–100.0)
Platelets: 236 10*3/uL (ref 150–400)
RBC: 4.67 MIL/uL (ref 4.22–5.81)
RDW: 14.4 % (ref 11.5–15.5)
WBC: 10.1 10*3/uL (ref 4.0–10.5)

## 2016-02-23 LAB — URINE MICROSCOPIC-ADD ON

## 2016-02-23 LAB — URINALYSIS, ROUTINE W REFLEX MICROSCOPIC
Bilirubin Urine: NEGATIVE
Glucose, UA: NEGATIVE mg/dL
Ketones, ur: NEGATIVE mg/dL
Leukocytes, UA: NEGATIVE
Nitrite: NEGATIVE
Protein, ur: NEGATIVE mg/dL
Specific Gravity, Urine: 1.019 (ref 1.005–1.030)
pH: 6.5 (ref 5.0–8.0)

## 2016-02-23 LAB — LIPASE, BLOOD: Lipase: 1004 U/L — ABNORMAL HIGH (ref 11–51)

## 2016-02-23 MED ORDER — OXYCODONE HCL 5 MG PO TABS: 5.0000 mg | ORAL_TABLET | ORAL | 0 refills | Status: DC | PRN

## 2016-02-23 MED ORDER — SODIUM CHLORIDE 0.9 % IV SOLN
Freq: Once | INTRAVENOUS | Status: AC
  Administered 2016-02-23: 12:00:00 via INTRAVENOUS

## 2016-02-23 MED ORDER — ONDANSETRON 4 MG PO TBDP: 4.0000 mg | ORAL_TABLET | Freq: Three times a day (TID) | ORAL | 0 refills | Status: DC | PRN

## 2016-02-23 MED ORDER — OXYCODONE HCL 5 MG PO TABS
5.0000 mg | ORAL_TABLET | Freq: Once | ORAL | Status: AC
  Administered 2016-02-23: 5 mg via ORAL
  Filled 2016-02-23: qty 1

## 2016-02-23 MED ORDER — SODIUM CHLORIDE 0.9 % IV BOLUS (SEPSIS)
500.0000 mL | Freq: Once | INTRAVENOUS | Status: AC
  Administered 2016-02-23: 500 mL via INTRAVENOUS

## 2016-02-23 MED ORDER — HYDROMORPHONE HCL 1 MG/ML IJ SOLN
1.0000 mg | Freq: Once | INTRAMUSCULAR | Status: AC
  Administered 2016-02-23: 1 mg via INTRAVENOUS
  Filled 2016-02-23: qty 1

## 2016-02-23 MED ORDER — ONDANSETRON HCL 4 MG/2ML IJ SOLN
4.0000 mg | Freq: Once | INTRAMUSCULAR | Status: AC
  Administered 2016-02-23: 4 mg via INTRAVENOUS
  Filled 2016-02-23: qty 2

## 2016-02-23 NOTE — ED Notes (Signed)
Patient provided sprite for PO fluid challenge

## 2016-02-23 NOTE — ED Provider Notes (Addendum)
Medical screening examination/treatment/procedure(s) were conducted as a shared visit with non-physician practitioner(s) and myself.  I personally evaluated the patient during the encounter. Briefly, the patient is a 65 year old male with past medical history of chronic alcoholism and recurrent pancreatitis who presents with a one-day history of epigastric abdominal pain, nausea, vomiting, and inability to tolerate by mouth. Lab work is consistent with marked lipase elevation consistent with acute pancreatitis. Abdomen is soft, nondistended, without evidence to suggest hemorrhagic or necrotic pancreatitis. Labs otherwise reassuring with normal sodium, calcium, and WBC.   Following pain control, pain markedly improved and pt is tolerating PO. Abdomen is now completely soft and non-tender. Symptoms present x 1 day. Based on Shared decision making with pt, in which I discussed his marked lipase elevation and likely future pain/nausea, pt states his sx are much improved and that he would like to manage at home. This is reasonable given o/w reassuring labs. Advised strict return precautions and will d/c with analgesia, antiemetics.    EKG Interpretation  Date/Time:  Tuesday February 23 2016 09:59:43 EDT Ventricular Rate:  61 PR Interval:    QRS Duration: 86 QT Interval:  390 QTC Calculation: 393 R Axis:   64 Text Interpretation:  Sinus rhythm Baseline wander in lead(s) II III aVF No acute changes No significant change since last tracing Confirmed by Kearah Gayden MD, Hadar Elgersma [phone]) on 02/23/2016 10:23:41 AM        Duffy Bruce, MD 02/23/16 1148    Duffy Bruce, MD 02/24/16 [phone]

## 2016-02-23 NOTE — ED Triage Notes (Signed)
Pt arrives via [facility] for c/o sharp epigastric pain that began around 0730 this am. Pt received '324mg'$  asa and '4mg'$  zofran pta. Pt reports hx of pancreatitis, last  Alcohol use last night. Pt denies vomiting but reports nausea. resp e/u, nad.

## 2016-02-23 NOTE — Discharge Instructions (Signed)
Clear liquid diet for the next 2 days. Limit alcohol use. Take mediation as prescribed for pain and nausea. Return with uncontrolled pain or vomiting, or any other concerns.

## 2016-02-23 NOTE — ED Notes (Signed)
PA [place] at bedside to discuss plan of care with patient.

## 2016-02-23 NOTE — ED Provider Notes (Signed)
[facility] DEPT Provider Note   CSN: [id number] Arrival date & time: 02/23/16  4098  First Provider Contact:  First MD Initiated Contact with Patient 02/23/16 1025        History   Chief Complaint Chief Complaint  Patient presents with  . Abdominal Pain    HPI   Patient is a 65 yo male with pmh of alcohol abuse, pancreatitis, GERD, DM, atrial fibrillation, CHF, and COPD here today for abdominal and chest pain. Patient states that pain started around 0700 this AM. He describes his chest pain as a sharp pain with associated sob that has since resolved. LUQ and epigastric pain continues. The pain is a constant 10/10 that radiates to back. Nothing makes better or worse. Patient endorses associated nausea. Denies any vomiting. He was given Zofran in route which has since relieved his nausea. Patient has history of pancreatitis with last attack that required hospitalization Nov 2016. Patient states he drinks approx. "3-4 beers at night". He denies any association of pain with food. He is a 50 pack year smoker.   Denies any fevers, HA, vision changes, cp, palpitations, sob, change in appetite, emesis, changes in bowel habits, hematachezia, melena, urinary symptoms, edema, weakness, dizziness,   The history is provided by the patient.  Abdominal Pain   Associated symptoms include nausea. Pertinent negatives include fever, diarrhea, vomiting, constipation, dysuria, frequency, hematuria and headaches.    Past Medical History:  Diagnosis Date  . Alcohol abuse   . Alcoholic pancreatitis   . Anginal pain ([place])   . Arthritis   . Atrial fibrillation status post cardioversion ([place]) 08/2014  . CHF (congestive heart failure) ([place])   . COPD (chronic obstructive pulmonary disease) ([place])   . Diabetes mellitus   . GERD (gastroesophageal reflux disease)   . Headache(784.0)   . Hyperlipidemia   . Pancreatic pseudocyst    seen on CT scan 07/2014  . Pneumonia   . Shortness of breath    with  ambulation  . Tobacco use disorder, continuous     Patient Active Problem List   Diagnosis Date Noted  . Insomnia 02/11/2016  . History of atrial fibrillation 06/19/2015  . On continuous oral anticoagulation 06/19/2015  . Neuropathy ([place]) 03/16/2015  . Hematuria, microscopic 02/13/2015  . Anxiety and depression 01/20/2015  . Alcohol-induced acute pancreatitis   . Neck pain, bilateral posterior 10/15/2014  . Chest pain   . Atrial flutter, unspecified   . Atrial fibrillation with rapid ventricular response ([place])   . COLD (chronic obstructive lung disease) ([place])   . Pancreatitis 08/04/2014  . Left foot pain 10/12/2013  . Low back pain 12/21/2012  . Polyuria 08/21/2012  . COPD (chronic obstructive pulmonary disease) ([employer]) 04/27/2012  . GERD (gastroesophageal reflux disease) 03/05/2012  . Alcohol abuse 06/09/2011  . Diabetes mellitus ([place]) 05/30/2011  . Chronic alcoholic pancreatitis ([place]) 05/30/2011  . Hyperlipidemia 05/30/2011  . Tobacco abuse 05/30/2011    Past Surgical History:  Procedure Laterality Date  . BACK SURGERY    . CARDIOVERSION N/A 09/22/2014   Procedure: CARDIOVERSION;  Surgeon: Thayer Headings, MD;  Location: [facility] ENDOSCOPY;  Service: Cardiovascular;  Laterality: N/A;  . NECK SURGERY     not cervical, states had lesion on neck which was removed  . SPLENECTOMY         Home Medications    Prior to Admission medications   Medication Sig Start Date End Date Taking? Authorizing Provider  albuterol (PROVENTIL HFA;VENTOLIN HFA) 108 (90 Base) MCG/ACT inhaler  Inhale 1-2 puffs into the lungs every 12 (twelve) hours as needed for wheezing or shortness of breath. 11/30/15  Yes Leone Brand, MD  atorvastatin (LIPITOR) 40 MG tablet Take 1 tablet (40 mg total) by mouth daily at 6 PM. 01/28/16  Yes Thayer Headings, MD  Blood Glucose Monitoring Suppl (ONE TOUCH ULTRA 2) W/DEVICE KIT Dispense one glucometer. Diagnosis Type 2 Diabetes. E11.9 09/12/14  Yes Willeen Niece, MD    cyanocobalamin 500 MCG tablet Take 500 mcg by mouth daily.   Yes Historical Provider, MD  famotidine (PEPCID) 40 MG tablet TAKE 1 TABLET (40 MG TOTAL) BY MOUTH DAILY. 02/11/16  Yes Vivi Barrack, MD  glucose blood (ONE TOUCH ULTRA TEST) test strip Use check fasting blood glucose qAM. Type 2 Diabetes E11.9 09/12/14  Yes Willeen Niece, MD  metFORMIN (GLUCOPHAGE) 1000 MG tablet Take 1 tablet (1,000 mg total) by mouth 2 (two) times daily with a meal. 02/11/16  Yes Vivi Barrack, MD  metoprolol (LOPRESSOR) 50 MG tablet Take 1.5 tablets (75 mg total) by mouth 2 (two) times daily. 01/28/16  Yes Thayer Headings, MD  ONE TOUCH LANCETS MISC Check fasting blood glucose qAM. Type 2 DM. E11.9 09/12/14  Yes Willeen Niece, MD  rivaroxaban (XARELTO) 20 MG TABS tablet Take 1 tablet (20 mg total) by mouth daily with supper. 01/28/16  Yes Thayer Headings, MD  ondansetron (ZOFRAN ODT) 4 MG disintegrating tablet Take 1 tablet (4 mg total) by mouth every 8 (eight) hours as needed for nausea or vomiting. 02/23/16   Doristine Devoid, PA-C  oxyCODONE (ROXICODONE) 5 MG immediate release tablet Take 1 tablet (5 mg total) by mouth every 4 (four) hours as needed for severe pain. 02/23/16   Doristine Devoid, PA-C    Family History Family History  Problem Relation Age of Onset  . Diabetes Mother   . Aneurysm Mother   . Stroke Mother   . Hypertension Mother   . Alcohol abuse Father   . Heart attack Neg Hx     Social History Social History  Substance Use Topics  . Smoking status: Current Every Day Smoker    Packs/day: 15.00    Years: 50.00    Types: Cigarettes    Start date: 04/09/1964  . Smokeless tobacco: Never Used     Comment: trying to quit. smoked 3 ppd before  . Alcohol use 0.0 oz/week    6 - 7 Cans of beer per week     Comment: "a beer once in a while"     Allergies   Tramadol; Prozac [fluoxetine hcl]; Morphine and related; and Penicillins   Review of Systems Review of Systems  Constitutional: Negative  for activity change, appetite change, fatigue, fever and unexpected weight change.  HENT: Negative.   Respiratory: Negative for chest tightness, shortness of breath and wheezing.   Cardiovascular: Negative for chest pain, palpitations and leg swelling.  Gastrointestinal: Positive for abdominal pain and nausea. Negative for abdominal distention, anal bleeding, blood in stool, constipation, diarrhea, rectal pain and vomiting.  Genitourinary: Negative for difficulty urinating, dysuria, flank pain, frequency and hematuria.  Musculoskeletal: Positive for back pain and joint swelling.  Skin: Negative for color change and pallor.  Neurological: Negative for dizziness, syncope, weakness, light-headedness and headaches.  Psychiatric/Behavioral: Negative.      Physical Exam Updated Vital Signs BP 107/67 (BP Location: Right Arm)   Pulse 64   Temp 98 F (36.7 C) (Oral)   Resp  20   SpO2 96%   Physical Exam  Constitutional: He is oriented to person, place, and time. He appears well-developed and well-nourished. No distress.  HENT:  Head: Normocephalic and atraumatic.  Cardiovascular: Normal rate, regular rhythm, normal heart sounds and intact distal pulses.  Exam reveals no gallop and no friction rub.   No murmur heard. Pulmonary/Chest: Effort normal. No respiratory distress. He has no wheezes. He exhibits no tenderness.  Decreased breath sounds throughout  Abdominal: Soft. Bowel sounds are normal. He exhibits no distension. There is tenderness (LUQ and epigastric with light palpation). There is guarding (Voluntary). There is no rebound.  Neurological: He is alert and oriented to person, place, and time.  Skin: Skin is warm and dry. Capillary refill takes less than 2 seconds. He is not diaphoretic.  Psychiatric: He has a normal mood and affect. His behavior is normal. Judgment and thought content normal.     ED Treatments / Results  Labs (all labs ordered are listed, but only abnormal  results are displayed) Labs Reviewed  LIPASE, BLOOD - Abnormal; Notable for the following:       Result Value   Lipase 1,004 (*)    All other components within normal limits  COMPREHENSIVE METABOLIC PANEL - Abnormal; Notable for the following:    Glucose, Bld 148 (*)    ALT 16 (*)    Anion gap 4 (*)    All other components within normal limits  URINALYSIS, ROUTINE W REFLEX MICROSCOPIC (NOT AT [facility]) - Abnormal; Notable for the following:    Hgb urine dipstick SMALL (*)    All other components within normal limits  URINE MICROSCOPIC-ADD ON - Abnormal; Notable for the following:    Squamous Epithelial / LPF 0-5 (*)    Bacteria, UA RARE (*)    Casts HYALINE CASTS (*)    All other components within normal limits  CBC    EKG  EKG Interpretation  Date/Time:  Tuesday February 23 2016 09:59:43 EDT Ventricular Rate:  61 PR Interval:    QRS Duration: 86 QT Interval:  390 QTC Calculation: 393 R Axis:   64 Text Interpretation:  Sinus rhythm Baseline wander in lead(s) II III aVF No acute changes No significant change since last tracing Confirmed by ISAACS MD, Lysbeth Galas [id number]) on 02/23/2016 10:23:41 AM       Radiology No results found.  Procedures Procedures (including critical care time)  Medications Ordered in ED Medications  ondansetron (ZOFRAN) injection 4 mg (4 mg Intravenous Given 02/23/16 1120)  sodium chloride 0.9 % bolus 500 mL (0 mLs Intravenous Stopped 02/23/16 1213)  HYDROmorphone (DILAUDID) injection 1 mg (1 mg Intravenous Given 02/23/16 1121)  0.9 %  sodium chloride infusion ( Intravenous New Bag/Given 02/23/16 1214)  oxyCODONE (Oxy IR/ROXICODONE) immediate release tablet 5 mg (5 mg Oral Given 02/23/16 1228)     Initial Impression / Assessment and Plan / ED Course  I have reviewed the triage vital signs and the nursing notes.  Pertinent labs & imaging results that were available during my care of the patient were reviewed by me and considered in my medical decision making (see  chart for details).  Clinical Course   Labs including lipase, CBC, CMET, UA  and a fluid bolus ordered on first impression for possible acute pancreatitis. Pain and nausea medicine given.  Re evaluated patient after pain medicine given. Patient states his pain is under control and rates it a 2/10. Denies any nausea. Informed patient I was still waiting on  lab results.  Lipase was markedly elevated consistent with acute pancreatitis. No imaging at this time due to high suspicion of pancreatitis. Patient would like to to go home.   Talked with patient about fluid challenge. Also prescribed Po pain med x1 to make sure his pain is controlled. If patient is able to tolerate po fluids and pain is controlled will d/c patient.  Discussed patient with Dr. Ellender Hose and he is agreeable with this course.  Final Clinical Impressions(s) / ED Diagnoses   Final diagnoses:  Alcohol-induced acute pancreatitis, unspecified complication status   MDM: Mr. Voong is a 65 yo male with significant pmh including pancreatitis and alcohol abuse. Current episode is similar to past episodes of pancreatitis. Elevated lipase consistent with acute pancreatitis. Denies any vomiting. Nausea controlled with medication. Pain controlled with PO medication. Low suspicion for pseudocyst or gallbladder disease. Low suspicion for PUD or perforation. Able to tolerate PO fluids. Labs are WNL. No concerns for hyponatremia, hypercalcemia, elevated white count. Patient would like to go home. Low suspicion for ACS. EKG normal and cp/sob has resolved. Patient rates his pain a 2/10 with current medication. Will send patient home with oxycodone and clear liquids x 2 days. Patient verbalizes understanding that if symptoms worsen, pain can not be controlled, or he starts to vomiting to return to ER. Talked with patient on importance of limiting alcohol use.  New Prescriptions New Prescriptions   ONDANSETRON (ZOFRAN ODT) 4 MG DISINTEGRATING  TABLET    Take 1 tablet (4 mg total) by mouth every 8 (eight) hours as needed for nausea or vomiting.   OXYCODONE (ROXICODONE) 5 MG IMMEDIATE RELEASE TABLET    Take 1 tablet (5 mg total) by mouth every 4 (four) hours as needed for severe pain.     Doristine Devoid, PA-C 02/23/16 1416    Duffy Bruce, MD 02/24/16 [phone]

## 2016-02-23 NOTE — ED Provider Notes (Signed)
Patient seen in conjunction with Leaphart PA-C. Patient with history of acute pancreatitis, continues to drink alcohol. Presents today with his typical pancreatitis symptoms. He has had nausea but no vomiting. He was treated in emergency department with a fluid bolus, Dilaudid 1, oral oxycodone. Patient has elevated lipase at around 1000. Patient is feeling much better and wants to go home. He does not have any concerning lab abnormalities otherwise. Pain is controlled and patient has not vomited while in the emergency department. He is drinking fluids without difficulty. Patient discussed with Dr. Ellender Hose. Low suspicion for other etiology including ACS, gallbladder disease, aortic dissection, other intra-abdominal infection. Strict return precautions discussed with patient. Seems reliable to return with worsening. He wants to go home with trial of pain medication and clear fluids. Will discharge.  BP 110/69   Pulse 68   Temp 98 F (36.7 C) (Oral)   Resp 19   SpO2 94%     Carlisle Cater, PA-C 02/23/16 1404    Duffy Bruce, MD 02/24/16 [phone]

## 2016-03-03 ENCOUNTER — Other Ambulatory Visit: Payer: Self-pay | Admitting: Family Medicine

## 2016-05-17 ENCOUNTER — Emergency Department (HOSPITAL_COMMUNITY): Payer: Medicare Other

## 2016-05-17 ENCOUNTER — Encounter (HOSPITAL_COMMUNITY): Payer: Self-pay | Admitting: Emergency Medicine

## 2016-05-17 ENCOUNTER — Inpatient Hospital Stay (HOSPITAL_COMMUNITY)
Admission: EM | Admit: 2016-05-17 | Discharge: 2016-05-19 | DRG: 439 | Disposition: A | Discharge status: Home / Self Care | Payer: Medicare Other | Attending: Family Medicine | Admitting: Family Medicine

## 2016-05-17 DIAGNOSIS — Z9889 Other specified postprocedural states: Secondary | ICD-10-CM

## 2016-05-17 DIAGNOSIS — Z7901 Long term (current) use of anticoagulants: Secondary | ICD-10-CM | POA: Diagnosis not present

## 2016-05-17 DIAGNOSIS — Z811 Family history of alcohol abuse and dependence: Secondary | ICD-10-CM | POA: Diagnosis not present

## 2016-05-17 DIAGNOSIS — K219 Gastro-esophageal reflux disease without esophagitis: Secondary | ICD-10-CM | POA: Diagnosis present

## 2016-05-17 DIAGNOSIS — Z888 Allergy status to other drugs, medicaments and biological substances status: Secondary | ICD-10-CM

## 2016-05-17 DIAGNOSIS — K861 Other chronic pancreatitis: Secondary | ICD-10-CM

## 2016-05-17 DIAGNOSIS — Z79899 Other long term (current) drug therapy: Secondary | ICD-10-CM

## 2016-05-17 DIAGNOSIS — Z7984 Long term (current) use of oral hypoglycemic drugs: Secondary | ICD-10-CM

## 2016-05-17 DIAGNOSIS — Z823 Family history of stroke: Secondary | ICD-10-CM

## 2016-05-17 DIAGNOSIS — Z8249 Family history of ischemic heart disease and other diseases of the circulatory system: Secondary | ICD-10-CM

## 2016-05-17 DIAGNOSIS — K859 Acute pancreatitis without necrosis or infection, unspecified: Secondary | ICD-10-CM

## 2016-05-17 DIAGNOSIS — M545 Low back pain: Secondary | ICD-10-CM | POA: Diagnosis present

## 2016-05-17 DIAGNOSIS — E785 Hyperlipidemia, unspecified: Secondary | ICD-10-CM | POA: Diagnosis present

## 2016-05-17 DIAGNOSIS — K860 Alcohol-induced chronic pancreatitis: Secondary | ICD-10-CM | POA: Diagnosis present

## 2016-05-17 DIAGNOSIS — Z880 Allergy status to penicillin: Secondary | ICD-10-CM

## 2016-05-17 DIAGNOSIS — I4891 Unspecified atrial fibrillation: Secondary | ICD-10-CM | POA: Diagnosis present

## 2016-05-17 DIAGNOSIS — Z833 Family history of diabetes mellitus: Secondary | ICD-10-CM

## 2016-05-17 DIAGNOSIS — G8929 Other chronic pain: Secondary | ICD-10-CM | POA: Diagnosis present

## 2016-05-17 DIAGNOSIS — J439 Emphysema, unspecified: Secondary | ICD-10-CM | POA: Diagnosis present

## 2016-05-17 DIAGNOSIS — E440 Moderate protein-calorie malnutrition: Secondary | ICD-10-CM | POA: Diagnosis present

## 2016-05-17 DIAGNOSIS — Z9081 Acquired absence of spleen: Secondary | ICD-10-CM | POA: Diagnosis not present

## 2016-05-17 DIAGNOSIS — F17200 Nicotine dependence, unspecified, uncomplicated: Secondary | ICD-10-CM | POA: Diagnosis present

## 2016-05-17 DIAGNOSIS — E119 Type 2 diabetes mellitus without complications: Secondary | ICD-10-CM | POA: Diagnosis present

## 2016-05-17 DIAGNOSIS — R0902 Hypoxemia: Secondary | ICD-10-CM | POA: Diagnosis not present

## 2016-05-17 DIAGNOSIS — F10188 Alcohol abuse with other alcohol-induced disorder: Secondary | ICD-10-CM | POA: Diagnosis present

## 2016-05-17 DIAGNOSIS — F17210 Nicotine dependence, cigarettes, uncomplicated: Secondary | ICD-10-CM | POA: Diagnosis present

## 2016-05-17 DIAGNOSIS — I482 Chronic atrial fibrillation, unspecified: Secondary | ICD-10-CM

## 2016-05-17 DIAGNOSIS — Z716 Tobacco abuse counseling: Secondary | ICD-10-CM | POA: Diagnosis not present

## 2016-05-17 DIAGNOSIS — R001 Bradycardia, unspecified: Secondary | ICD-10-CM | POA: Diagnosis present

## 2016-05-17 DIAGNOSIS — K852 Alcohol induced acute pancreatitis without necrosis or infection: Secondary | ICD-10-CM | POA: Diagnosis present

## 2016-05-17 DIAGNOSIS — Z6823 Body mass index (BMI) 23.0-23.9, adult: Secondary | ICD-10-CM

## 2016-05-17 HISTORY — DX: Acute pancreatitis without necrosis or infection, unspecified: K86.1

## 2016-05-17 HISTORY — DX: Headache: R51

## 2016-05-17 HISTORY — DX: Unspecified chronic bronchitis: J42

## 2016-05-17 HISTORY — DX: Other chronic pancreatitis: K85.90

## 2016-05-17 HISTORY — DX: Essential (primary) hypertension: I10

## 2016-05-17 HISTORY — DX: Type 2 diabetes mellitus without complications: E11.9

## 2016-05-17 HISTORY — DX: Headache, unspecified: R51.9

## 2016-05-17 LAB — GLUCOSE, CAPILLARY
Glucose-Capillary: 83 mg/dL (ref 65–99)
Glucose-Capillary: 99 mg/dL (ref 65–99)

## 2016-05-17 LAB — URINALYSIS, ROUTINE W REFLEX MICROSCOPIC
Bilirubin Urine: NEGATIVE
Glucose, UA: NEGATIVE mg/dL
Ketones, ur: NEGATIVE mg/dL
Leukocytes, UA: NEGATIVE
Nitrite: NEGATIVE
Protein, ur: NEGATIVE mg/dL
Specific Gravity, Urine: 1.015 (ref 1.005–1.030)
pH: 5.5 (ref 5.0–8.0)

## 2016-05-17 LAB — LIPASE, BLOOD: Lipase: 740 U/L — ABNORMAL HIGH (ref 11–51)

## 2016-05-17 LAB — COMPREHENSIVE METABOLIC PANEL
ALT: 12 U/L — ABNORMAL LOW (ref 17–63)
AST: 21 U/L (ref 15–41)
Albumin: 3.9 g/dL (ref 3.5–5.0)
Alkaline Phosphatase: 49 U/L (ref 38–126)
Anion gap: 6 (ref 5–15)
BUN: <5 mg/dL — ABNORMAL LOW (ref 6–20)
CO2: 30 mmol/L (ref 22–32)
Calcium: 9.3 mg/dL (ref 8.9–10.3)
Chloride: 106 mmol/L (ref 101–111)
Creatinine, Ser: 0.75 mg/dL (ref 0.61–1.24)
GFR calc Af Amer: >60 mL/min (ref >60)
GFR calc non Af Amer: >60 mL/min (ref >60)
Glucose, Bld: 143 mg/dL — ABNORMAL HIGH (ref 65–99)
Potassium: 4.5 mmol/L (ref 3.5–5.1)
Sodium: 142 mmol/L (ref 135–145)
Total Bilirubin: 0.8 mg/dL (ref 0.3–1.2)
Total Protein: 7.5 g/dL (ref 6.5–8.1)

## 2016-05-17 LAB — CBC
HCT: 46.9 % (ref 39.0–52.0)
Hemoglobin: 15.5 g/dL (ref 13.0–17.0)
MCH: 31.7 pg (ref 26.0–34.0)
MCHC: 33 g/dL (ref 30.0–36.0)
MCV: 95.9 fL (ref 78.0–100.0)
Platelets: 251 10*3/uL (ref 150–400)
RBC: 4.89 MIL/uL (ref 4.22–5.81)
RDW: 14.4 % (ref 11.5–15.5)
WBC: 7.7 10*3/uL (ref 4.0–10.5)

## 2016-05-17 LAB — TROPONIN I
Troponin I: <0.03 ng/mL (ref <0.03)
Troponin I: <0.03 ng/mL (ref <0.03)

## 2016-05-17 LAB — URINE MICROSCOPIC-ADD ON

## 2016-05-17 LAB — PROTIME-INR
INR: 1.44
Prothrombin Time: 17.6 seconds — ABNORMAL HIGH (ref 11.4–15.2)

## 2016-05-17 LAB — I-STAT TROPONIN, ED: Troponin i, poc: 0 ng/mL (ref 0.00–0.08)

## 2016-05-17 LAB — LACTATE DEHYDROGENASE: LDH: 121 U/L (ref 98–192)

## 2016-05-17 MED ORDER — HYDROMORPHONE HCL 2 MG/ML IJ SOLN
0.5000 mg | INTRAMUSCULAR | Status: DC | PRN
  Administered 2016-05-17 – 2016-05-18 (×4): 0.5 mg via INTRAVENOUS
  Filled 2016-05-17 (×4): qty 1

## 2016-05-17 MED ORDER — FAMOTIDINE 20 MG PO TABS
40.0000 mg | ORAL_TABLET | Freq: Every day | ORAL | Status: DC
  Administered 2016-05-18 – 2016-05-19 (×2): 40 mg via ORAL
  Filled 2016-05-17 (×4): qty 2

## 2016-05-17 MED ORDER — METOPROLOL TARTRATE 25 MG PO TABS
75.0000 mg | ORAL_TABLET | Freq: Two times a day (BID) | ORAL | Status: DC
  Administered 2016-05-17 – 2016-05-19 (×4): 75 mg via ORAL
  Filled 2016-05-17 (×4): qty 1

## 2016-05-17 MED ORDER — LORAZEPAM 2 MG/ML IJ SOLN: 2.0000 mg | INTRAMUSCULAR | Status: DC | PRN

## 2016-05-17 MED ORDER — RIVAROXABAN 20 MG PO TABS
20.0000 mg | ORAL_TABLET | Freq: Every day | ORAL | Status: DC
  Administered 2016-05-18: 20 mg via ORAL
  Filled 2016-05-17 (×2): qty 1

## 2016-05-17 MED ORDER — NICOTINE 14 MG/24HR TD PT24
14.0000 mg | MEDICATED_PATCH | Freq: Every day | TRANSDERMAL | Status: DC
  Administered 2016-05-18: 14 mg via TRANSDERMAL
  Filled 2016-05-17 (×2): qty 1

## 2016-05-17 MED ORDER — ENSURE ENLIVE PO LIQD
237.0000 mL | Freq: Two times a day (BID) | ORAL | Status: DC
  Administered 2016-05-18 (×2): 237 mL via ORAL

## 2016-05-17 MED ORDER — SODIUM CHLORIDE 0.9 % IV BOLUS (SEPSIS)
1000.0000 mL | Freq: Once | INTRAVENOUS | Status: AC
  Administered 2016-05-17: 1000 mL via INTRAVENOUS

## 2016-05-17 MED ORDER — INSULIN ASPART 100 UNIT/ML ~~LOC~~ SOLN
0.0000 [IU] | Freq: Three times a day (TID) | SUBCUTANEOUS | Status: DC
  Administered 2016-05-18: 5 [IU] via SUBCUTANEOUS

## 2016-05-17 MED ORDER — ADULT MULTIVITAMIN W/MINERALS CH
1.0000 | ORAL_TABLET | Freq: Every day | ORAL | Status: DC
  Administered 2016-05-17 – 2016-05-19 (×3): 1 via ORAL
  Filled 2016-05-17 (×3): qty 1

## 2016-05-17 MED ORDER — HYDROMORPHONE HCL 2 MG/ML IJ SOLN: 1.0000 mg | INTRAMUSCULAR | Status: DC | PRN

## 2016-05-17 MED ORDER — ONDANSETRON HCL 4 MG/2ML IJ SOLN: 4.0000 mg | Freq: Three times a day (TID) | INTRAMUSCULAR | Status: DC | PRN

## 2016-05-17 MED ORDER — HYDROMORPHONE HCL 2 MG/ML IJ SOLN
1.0000 mg | Freq: Once | INTRAMUSCULAR | Status: AC
  Administered 2016-05-17: 1 mg via INTRAVENOUS
  Filled 2016-05-17: qty 1

## 2016-05-17 MED ORDER — ACETAMINOPHEN 325 MG PO TABS: 650.0000 mg | ORAL_TABLET | Freq: Four times a day (QID) | ORAL | Status: DC | PRN

## 2016-05-17 MED ORDER — ONDANSETRON HCL 4 MG/2ML IJ SOLN: 4.0000 mg | Freq: Four times a day (QID) | INTRAMUSCULAR | Status: DC | PRN

## 2016-05-17 MED ORDER — FOLIC ACID 1 MG PO TABS
1.0000 mg | ORAL_TABLET | Freq: Every day | ORAL | Status: DC
  Administered 2016-05-17 – 2016-05-19 (×3): 1 mg via ORAL
  Filled 2016-05-17 (×3): qty 1

## 2016-05-17 MED ORDER — VITAMIN B-1 100 MG PO TABS
100.0000 mg | ORAL_TABLET | Freq: Every day | ORAL | Status: DC
  Administered 2016-05-17 – 2016-05-19 (×3): 100 mg via ORAL
  Filled 2016-05-17 (×3): qty 1

## 2016-05-17 MED ORDER — LORAZEPAM 1 MG PO TABS: 2.0000 mg | ORAL_TABLET | ORAL | Status: DC | PRN

## 2016-05-17 MED ORDER — POLYETHYLENE GLYCOL 3350 17 G PO PACK
17.0000 g | PACK | Freq: Every day | ORAL | Status: DC | PRN
Start: 2016-05-17 — End: 2016-05-18

## 2016-05-17 MED ORDER — ATORVASTATIN CALCIUM 40 MG PO TABS
40.0000 mg | ORAL_TABLET | Freq: Every day | ORAL | Status: DC
  Administered 2016-05-18: 40 mg via ORAL
  Filled 2016-05-17: qty 1

## 2016-05-17 MED ORDER — ONDANSETRON HCL 4 MG/2ML IJ SOLN
4.0000 mg | Freq: Once | INTRAMUSCULAR | Status: AC
  Administered 2016-05-17: 4 mg via INTRAVENOUS
  Filled 2016-05-17: qty 2

## 2016-05-17 MED ORDER — ACETAMINOPHEN 650 MG RE SUPP: 650.0000 mg | Freq: Four times a day (QID) | RECTAL | Status: DC | PRN

## 2016-05-17 MED ORDER — ALBUTEROL SULFATE (2.5 MG/3ML) 0.083% IN NEBU
3.0000 mL | INHALATION_SOLUTION | Freq: Two times a day (BID) | RESPIRATORY_TRACT | Status: DC | PRN
  Administered 2016-05-18: 3 mL via RESPIRATORY_TRACT
  Filled 2016-05-17: qty 3

## 2016-05-17 MED ORDER — SODIUM CHLORIDE 0.9 % IV SOLN
INTRAVENOUS | Status: DC
  Administered 2016-05-17: 1000 mL via INTRAVENOUS
  Administered 2016-05-18 – 2016-05-19 (×3): via INTRAVENOUS

## 2016-05-17 MED ORDER — ONDANSETRON HCL 4 MG PO TABS: 4.0000 mg | ORAL_TABLET | Freq: Four times a day (QID) | ORAL | Status: DC | PRN

## 2016-05-17 NOTE — ED Notes (Signed)
Pt to xray

## 2016-05-17 NOTE — ED Provider Notes (Signed)
[place] DEPT Provider Note   CSN: [id number] Arrival date & time: 05/17/16  1009     History   Chief Complaint Chief Complaint  Patient presents with  . Abdominal Pain  . Arm Pain    HPI Timothy Martinez is a 65 y.o. male past medical history of alcohol abuse, and pancreatitis. He presents emergency probably chief complaint of chest and epigastric pain. Patient states the he ate hot chili about 2 days ago. He states his last alcohol intake was about 6 days ago. He states that the pain began as kind of burning sensation in his chest and also in the right quadrant, radiating up into his right shoulder. The pain has now localized to the epigastrium and left side. He states that he feels a previous episodes of his pancreatitis. He has severe pain. He had 2 episodes of retching without vomiting today.  HPI  Past Medical History:  Diagnosis Date  . Alcohol abuse   . Alcoholic pancreatitis   . Anginal pain ([place])   . Arthritis   . Atrial fibrillation status post cardioversion ([place]) 08/2014  . CHF (congestive heart failure) ([place])   . COPD (chronic obstructive pulmonary disease) ([place])   . Diabetes mellitus   . GERD (gastroesophageal reflux disease)   . Headache(784.0)   . Hyperlipidemia   . Pancreatic pseudocyst    seen on CT scan 07/2014  . Pneumonia   . Shortness of breath    with ambulation  . Tobacco use disorder, continuous     Patient Active Problem List   Diagnosis Date Noted  . Acute pancreatitis 05/17/2016  . Acute on chronic pancreatitis ([place]) 05/17/2016  . Insomnia 02/11/2016  . History of atrial fibrillation 06/19/2015  . On continuous oral anticoagulation 06/19/2015  . Neuropathy ([place]) 03/16/2015  . Hematuria, microscopic 02/13/2015  . Anxiety and depression 01/20/2015  . Alcohol-induced acute pancreatitis   . Neck pain, bilateral posterior 10/15/2014  . Chest pain   . Atrial flutter, unspecified   . Atrial fibrillation with rapid ventricular response  ([place])   . COLD (chronic obstructive lung disease) ([place])   . Pancreatitis 08/04/2014  . Left foot pain 10/12/2013  . Low back pain 12/21/2012  . Polyuria 08/21/2012  . COPD (chronic obstructive pulmonary disease) ([place]) 04/27/2012  . GERD (gastroesophageal reflux disease) 03/05/2012  . Alcohol abuse 06/09/2011  . Diabetes mellitus ([place]) 05/30/2011  . Chronic alcoholic pancreatitis ([place]) 05/30/2011  . Hyperlipidemia 05/30/2011  . Tobacco abuse 05/30/2011    Past Surgical History:  Procedure Laterality Date  . BACK SURGERY    . CARDIOVERSION N/A 09/22/2014   Procedure: CARDIOVERSION;  Surgeon: Thayer Headings, MD;  Location: [facility] ENDOSCOPY;  Service: Cardiovascular;  Laterality: N/A;  . NECK SURGERY     not cervical, states had lesion on neck which was removed  . SPLENECTOMY         Home Medications    Prior to Admission medications   Medication Sig Start Date End Date Taking? Authorizing Provider  albuterol (PROVENTIL HFA;VENTOLIN HFA) 108 (90 Base) MCG/ACT inhaler Inhale 1-2 puffs into the lungs every 12 (twelve) hours as needed for wheezing or shortness of breath. 11/30/15  Yes Leone Brand, MD  atorvastatin (LIPITOR) 40 MG tablet Take 1 tablet (40 mg total) by mouth daily at 6 PM. 01/28/16  Yes Thayer Headings, MD  Blood Glucose Monitoring Suppl (ONE TOUCH ULTRA 2) W/DEVICE KIT Dispense one glucometer. Diagnosis Type 2 Diabetes. E11.9 09/12/14  Yes Kathryne Eriksson  Lindell Noe, MD  cyanocobalamin 500 MCG tablet Take 500 mcg by mouth daily.   Yes Historical Provider, MD  famotidine (PEPCID) 40 MG tablet TAKE 1 TABLET (40 MG TOTAL) BY MOUTH DAILY. Patient taking differently: Take 40 mg by mouth every other day 03/03/16  Yes Ashly M Gottschalk, DO  glucose blood (ONE TOUCH ULTRA TEST) test strip Use check fasting blood glucose qAM. Type 2 Diabetes E11.9 09/12/14  Yes Willeen Niece, MD  metFORMIN (GLUCOPHAGE) 500 MG tablet TAKE 2 TABLETS BY MOUTH 2 TIMES DAILY WITH A MEAL. Patient taking differently:  Take 1,000 mg by mouth two times a day 03/03/16  Yes Ashly M Gottschalk, DO  metoprolol (LOPRESSOR) 50 MG tablet Take 1.5 tablets (75 mg total) by mouth 2 (two) times daily. 01/28/16  Yes Thayer Headings, MD  ondansetron (ZOFRAN ODT) 4 MG disintegrating tablet Take 1 tablet (4 mg total) by mouth every 8 (eight) hours as needed for nausea or vomiting. 02/23/16  Yes Chrissie Noa T Leaphart, PA-C  ONE TOUCH LANCETS MISC Check fasting blood glucose qAM. Type 2 DM. E11.9 09/12/14  Yes Willeen Niece, MD  rivaroxaban (XARELTO) 20 MG TABS tablet Take 1 tablet (20 mg total) by mouth daily with supper. 01/28/16  Yes Thayer Headings, MD  oxyCODONE (ROXICODONE) 5 MG immediate release tablet Take 1 tablet (5 mg total) by mouth every 4 (four) hours as needed for severe pain. Patient not taking: Reported on 05/17/2016 02/23/16   Doristine Devoid, PA-C    Family History Family History  Problem Relation Age of Onset  . Diabetes Mother   . Aneurysm Mother   . Stroke Mother   . Hypertension Mother   . Alcohol abuse Father   . Heart attack Neg Hx     Social History Social History  Substance Use Topics  . Smoking status: Current Every Day Smoker    Packs/day: 15.00    Years: 50.00    Types: Cigarettes    Start date: 04/09/1964  . Smokeless tobacco: Never Used     Comment: trying to quit. smoked 3 ppd before  . Alcohol use 0.0 oz/week    6 - 7 Cans of beer per week     Comment: "a beer once in a while"     Allergies   Tramadol; Prozac [fluoxetine hcl]; Morphine and related; and Penicillins   Review of Systems Review of Systems  Ten systems reviewed and are negative for acute change, except as noted in the HPI.   Physical Exam Updated Vital Signs BP 125/84 (BP Location: Left Arm)   Pulse 62   Temp 97.7 F (36.5 C) (Oral)   Resp 18   Ht '6\' 3"'  (1.905 m)   Wt 83.7 kg   SpO2 93%   BMI 23.07 kg/m   Physical Exam  Constitutional: He appears well-developed and well-nourished. No distress.  HENT:    Head: Normocephalic and atraumatic.  Eyes: Conjunctivae are normal. No scleral icterus.  Neck: Normal range of motion. Neck supple.  Cardiovascular: Normal rate, regular rhythm and normal heart sounds.   Pulmonary/Chest: Effort normal and breath sounds normal. No respiratory distress.  Abdominal: Soft. There is tenderness.    Musculoskeletal: He exhibits no edema.  Neurological: He is alert.  Skin: Skin is warm and dry. He is not diaphoretic.  Psychiatric: His behavior is normal.  Nursing note and vitals reviewed.    ED Treatments / Results  Labs (all labs ordered are listed, but only abnormal results are  displayed) Labs Reviewed  LIPASE, BLOOD - Abnormal; Notable for the following:       Result Value   Lipase 740 (*)    All other components within normal limits  COMPREHENSIVE METABOLIC PANEL - Abnormal; Notable for the following:    Glucose, Bld 143 (*)    BUN <5 (*)    ALT 12 (*)    All other components within normal limits  URINALYSIS, ROUTINE W REFLEX MICROSCOPIC (NOT AT [facility]) - Abnormal; Notable for the following:    Hgb urine dipstick SMALL (*)    All other components within normal limits  PROTIME-INR - Abnormal; Notable for the following:    Prothrombin Time 17.6 (*)    All other components within normal limits  URINE MICROSCOPIC-ADD ON - Abnormal; Notable for the following:    Squamous Epithelial / LPF 0-5 (*)    Bacteria, UA MANY (*)    All other components within normal limits  CBC  GLUCOSE, CAPILLARY  LACTATE DEHYDROGENASE  TROPONIN I  TROPONIN I  TROPONIN I  CBC  COMPREHENSIVE METABOLIC PANEL  I-STAT TROPOININ, ED    EKG  EKG Interpretation None       Radiology Dg Chest 2 View  Result Date: 05/17/2016 CLINICAL DATA:  Acute right arm and chest pain intermittently for 2 weeks. COPD. EXAM: CHEST  2 VIEW COMPARISON:  01/14/2015 FINDINGS: Chronic diffuse COPD/ emphysema as before with peripheral interstitial fibrosis/ scarring. Nipple shadows  noted bilaterally. No definite focal pneumonia, collapse or consolidation. Negative for edema, effusion or pneumothorax. Normal heart size and vascularity. Trachea is midline. Minor thoracic spondylosis. IMPRESSION: Stable COPD/emphysema and interstitial fibrotic pattern. No superimposed acute process Electronically Signed   By: Jerilynn Mages.  Shick M.D.   On: 05/17/2016 12:04    Procedures Procedures (including critical care time)  Medications Ordered in ED Medications  famotidine (PEPCID) tablet 40 mg (not administered)  atorvastatin (LIPITOR) tablet 40 mg (not administered)  metoprolol tartrate (LOPRESSOR) tablet 75 mg (not administered)  rivaroxaban (XARELTO) tablet 20 mg (not administered)  albuterol (PROVENTIL) (2.5 MG/3ML) 0.083% nebulizer solution 3 mL (not administered)  0.9 %  sodium chloride infusion (1,000 mLs Intravenous New Bag/Given 05/17/16 1716)  acetaminophen (TYLENOL) tablet 650 mg (not administered)    Or  acetaminophen (TYLENOL) suppository 650 mg (not administered)  polyethylene glycol (MIRALAX / GLYCOLAX) packet 17 g (not administered)  ondansetron (ZOFRAN) tablet 4 mg (not administered)    Or  ondansetron (ZOFRAN) injection 4 mg (not administered)  thiamine (VITAMIN B-1) tablet 100 mg (100 mg Oral Given 05/17/16 1716)  multivitamin with minerals tablet 1 tablet (1 tablet Oral Given 81/01/75 1025)  folic acid (FOLVITE) tablet 1 mg (1 mg Oral Given 05/17/16 1716)  insulin aspart (novoLOG) injection 0-15 Units (0 Units Subcutaneous Not Given 05/17/16 1700)  LORazepam (ATIVAN) injection 2-3 mg (not administered)  HYDROmorphone (DILAUDID) injection 0.5 mg (not administered)  sodium chloride 0.9 % bolus 1,000 mL (0 mLs Intravenous Stopped 05/17/16 1353)  HYDROmorphone (DILAUDID) injection 1 mg (1 mg Intravenous Given 05/17/16 1145)  ondansetron (ZOFRAN) injection 4 mg (4 mg Intravenous Given 05/17/16 1146)  HYDROmorphone (DILAUDID) injection 1 mg (1 mg Intravenous Given 05/17/16  1513)     Initial Impression / Assessment and Plan / ED Course  I have reviewed the triage vital signs and the nursing notes.  Pertinent labs & imaging results that were available during my care of the patient were reviewed by me and considered in my medical decision making (see chart for  details).  Clinical Course    Patient admitted for acute pancreatitis. He agrees with plan of care. No significant decline during his ED course. Patient appears safe for admission at this time. He had some hypoxia, which I suspect is secondary to guarding due to his abdominal pain  Final Clinical Impressions(s) / ED Diagnoses   Final diagnoses:  Acute pancreatitis, unspecified complication status, unspecified pancreatitis type  Hypoxia    New Prescriptions Current Discharge Medication List       Margarita Mail, PA-C 05/17/16 [place], MD 05/24/16 1709

## 2016-05-17 NOTE — ED Triage Notes (Signed)
Pt sts right arm pain x 2 weeks and RUQ to CP on right side; pt sts hx of pancreatitis and feels same

## 2016-05-17 NOTE — H&P (Signed)
[facility] Admission History and Physical Service Pager: [phone]  Patient name: Timothy Martinez Medical record number: [id number] Date of birth: August 04, 1950 Age: 65 y.o. Gender: male  Primary Care Provider: Dimas Chyle, MD Consultants: None Code Status: Full Code   Chief Complaint:   Assessment and Plan: Timothy Martinez is a 65 y.o. male presenting with chest and abdominal pain  . PMH is significant for Chronic pancreatitis, atrial fibrillation on xarelto, HLD, T2DM, chronic back pain  Acute on chronic alcoholic pancreatitis: Pain consistent with previous episodes of acute pancreatitis; pain initially started in middle of chest and radiated to back. It is now in his epigastrium and LUQ. Had nausea and retching at home. Last admission for pancreatitis on 8/17. History of pseudocyst in 6/16. RANSON score 1 upon admission. S/p 1 L NS in ED. In ED, vital signs stable except for mild bradycardia. CXR showing stable COPD/emphysema and interstitial fibrotic pattern. Normal LFTs. Lipase 740. Pain relieved with IV Dilaudid in ED.  - Admit to telemetry, attending Dr. Wendy Martinez - Soft diet, ADAT - mIVF overnight - If clinically worsening, consider CT abdomen - AM CBC/CMP - f/u LDH - Dilauded 0.53m q4hrs PRN pain, likely transition to PO pain medications tomorrow  Chest pain: Likely secondary to acute on chronic pancreatitis, however will need to rule out ACS event. Patient describes chest pressure that was central and radiating to his back. In ED, vital signs stable except for mild bradycardia to 56. First troponin negative. EKG showed NSR in ED. Pain resolved with IV dilauded. ASCVD risk score: 21.6%, HEART score 2.  - Continuous cardiac monitoring - trend troponins   - repeat AM EKG - pain control with IV dilauded 0.55mq4hrs PRN  Atrial fibrillation: Rate controlled with metoprolol and anticoagulated on xarelto. Currently NSR.  - continue to monitor  - continue  metoprolol  - cont xarelto  HFpEF:  Echo (12/30/14) EF 55-60%; moderate LVH  - Continue home Metoprolol 5028mID  COPD: Former smoker. Satting well on RA. No home O2 requirement.  - continue home albuterol PRN  Hyperlipidemia:  - continue lipitor 21m78mype 2 diabetes:  Hgb A1C: 6 on 01/2016.  Takes metformin 500mg76m. - Hold home Metformin for now - SSI - CBGs  Chronic back pain: History of back surgery.  Takes oxycodone IR 5mg q27ms PRN.  Has some residual LLE weakness 4/5.   - controlling pain with dilauded 0.5mg q475m PRN - PT/OT  Alcohol abuse:  Reportedly does not drink much anymore.  One beer in last few weeks.  - f/u CIWA scores - folic acid, thiamine  Tobacco Abuse: Smoking >50 years - Nicotine patch - Smoking cessation counseling  FEN/GI: Soft diet/NS @ 125cc/hr Prophylaxis: xarelto  Disposition: Admit to FMTS   History of Present Illness:  Timothy Timothy REISIG5 y.o.26ale presenting with acute chest pain  Patient states that he was "hurtin bad" today and felt like his chest was "blowing up". Had "14/10 chest pain". Pain started in center of his chest and then radiated to his back.  This woke him from sleep and he developed some nausea and retching, but was unable to vomit up anything.  Denied any shortness of breath or heart palpitations.  Has had recurrent episodes of pancreatitis since around 2003, historically alcohol induced.  Most recent hospital visit for pancreatitis was on 8/17.   Patient states that he has had this type of pain before when he had pancreatitis.  He arrived to the hospital around 9:45AM.  Patient states he does not have a spleen but he does have his gallbladder. Denies any pain in his RUQ. Does not take any medication for his pancreas.  Received IVF and dilauded in the ED and pain is now well controlled and is asking for food.    Review Of Systems: Per HPI   ROS  Patient Active Problem List   Diagnosis Date Noted  . Acute pancreatitis  05/17/2016  . Acute on chronic pancreatitis ([place]) 05/17/2016  . Insomnia 02/11/2016  . History of atrial fibrillation 06/19/2015  . On continuous oral anticoagulation 06/19/2015  . Neuropathy ([place]) 03/16/2015  . Hematuria, microscopic 02/13/2015  . Anxiety and depression 01/20/2015  . Alcohol-induced acute pancreatitis   . Neck pain, bilateral posterior 10/15/2014  . Chest pain   . Atrial flutter, unspecified   . Atrial fibrillation with rapid ventricular response ([place])   . COLD (chronic obstructive lung disease) ([place])   . Pancreatitis 08/04/2014  . Left foot pain 10/12/2013  . Low back pain 12/21/2012  . Polyuria 08/21/2012  . COPD (chronic obstructive pulmonary disease) ([place]) 04/27/2012  . GERD (gastroesophageal reflux disease) 03/05/2012  . Alcohol abuse 06/09/2011  . Diabetes mellitus ([place]) 05/30/2011  . Chronic alcoholic pancreatitis ([place]) 05/30/2011  . Hyperlipidemia 05/30/2011  . Tobacco abuse 05/30/2011    Past Medical History: Past Medical History:  Diagnosis Date  . Alcohol abuse   . Alcoholic pancreatitis   . Anginal pain ([place])   . Arthritis   . Atrial fibrillation status post cardioversion ([place]) 08/2014  . CHF (congestive heart failure) ([place])   . COPD (chronic obstructive pulmonary disease) ([place])   . Diabetes mellitus   . GERD (gastroesophageal reflux disease)   . Headache(784.0)   . Hyperlipidemia   . Pancreatic pseudocyst    seen on CT scan 07/2014  . Pneumonia   . Shortness of breath    with ambulation  . Tobacco use disorder, continuous     Past Surgical History: Past Surgical History:  Procedure Laterality Date  . BACK SURGERY    . CARDIOVERSION N/A 09/22/2014   Procedure: CARDIOVERSION;  Surgeon: Timothy Headings, MD;  Location: [facility] ENDOSCOPY;  Service: Cardiovascular;  Laterality: N/A;  . NECK SURGERY     not cervical, states had lesion on neck which was removed  . SPLENECTOMY      Social History: Social History  Substance Use Topics  .  Smoking status: Current Every Day Smoker    Packs/day: 15.00    Years: 50.00    Types: Cigarettes    Start date: 04/09/1964  . Smokeless tobacco: Never Used     Comment: trying to quit. smoked 3 ppd before  . Alcohol use 0.0 oz/week    6 - 7 Cans of beer per week     Comment: "a beer once in a while"    Family History: Family History  Problem Relation Age of Onset  . Diabetes Mother   . Aneurysm Mother   . Stroke Mother   . Hypertension Mother   . Alcohol abuse Father   . Heart attack Neg Hx     Allergies and Medications: Allergies  Allergen Reactions  . Tramadol Nausea And Vomiting  . Prozac [Fluoxetine Hcl] Nausea Only    Very sick on stomach  . Morphine And Related Swelling    Pt states he can tolerate oxycodone and hydromorphone  . Penicillins Rash    Has patient had a PCN  reaction causing immediate rash, facial/tongue/throat swelling, SOB or lightheadedness with hypotension: Yes Has patient had a PCN reaction causing severe rash involving mucus membranes or skin necrosis: No Has patient had a PCN reaction that required hospitalization: No Has patient had a PCN reaction occurring within the last 10 years: No If all of the above answers are "NO", then may proceed with Cephalosporin use.    No current facility-administered medications on file prior to encounter.    Current Outpatient Prescriptions on File Prior to Encounter  Medication Sig Dispense Refill  . albuterol (PROVENTIL HFA;VENTOLIN HFA) 108 (90 Base) MCG/ACT inhaler Inhale 1-2 puffs into the lungs every 12 (twelve) hours as needed for wheezing or shortness of breath. 1 Inhaler 3  . atorvastatin (LIPITOR) 40 MG tablet Take 1 tablet (40 mg total) by mouth daily at 6 PM. 90 tablet 3  . Blood Glucose Monitoring Suppl (ONE TOUCH ULTRA 2) W/DEVICE KIT Dispense one glucometer. Diagnosis Type 2 Diabetes. E11.9 1 each 0  . cyanocobalamin 500 MCG tablet Take 500 mcg by mouth daily.    . famotidine (PEPCID) 40 MG tablet  TAKE 1 TABLET (40 MG TOTAL) BY MOUTH DAILY. (Patient taking differently: Take 40 mg by mouth every other day) 90 tablet 3  . glucose blood (ONE TOUCH ULTRA TEST) test strip Use check fasting blood glucose qAM. Type 2 Diabetes E11.9 100 each 3  . metFORMIN (GLUCOPHAGE) 500 MG tablet TAKE 2 TABLETS BY MOUTH 2 TIMES DAILY WITH A MEAL. (Patient taking differently: Take 1,000 mg by mouth two times a day) 360 tablet 3  . metoprolol (LOPRESSOR) 50 MG tablet Take 1.5 tablets (75 mg total) by mouth 2 (two) times daily. 270 tablet 3  . ondansetron (ZOFRAN ODT) 4 MG disintegrating tablet Take 1 tablet (4 mg total) by mouth every 8 (eight) hours as needed for nausea or vomiting. 10 tablet 0  . ONE TOUCH LANCETS MISC Check fasting blood glucose qAM. Type 2 DM. E11.9 200 each 1  . rivaroxaban (XARELTO) 20 MG TABS tablet Take 1 tablet (20 mg total) by mouth daily with supper. 30 tablet 11  . oxyCODONE (ROXICODONE) 5 MG immediate release tablet Take 1 tablet (5 mg total) by mouth every 4 (four) hours as needed for severe pain. (Patient not taking: Reported on 05/17/2016) 10 tablet 0    Objective: BP 125/84 (BP Location: Left Arm)   Pulse 62   Temp 97.7 F (36.5 C) (Oral)   Resp 18   Ht 6' 3" (1.905 m)   Wt 184 lb 9.6 oz (83.7 kg)   SpO2 93%   BMI 23.07 kg/m  Exam: General: 65yo M lying in bed appearing comfortable, in NAD Eyes: EOMI, PERRL ENTM: MMM, clear oropharynx Neck: supple, no LAD Cardiovascular: RRR, no murmurs, rubs or gallops Respiratory: normal work of breathing, CTABL, no wheezing or rhonci Gastrointestinal: well-healed transverse abdominal scar, soft, moderately tender epigastric pain, no peritoneal signs, non-distended MSK: 4/5 strength LLE, otherwise 5/5 throughout.   Derm: warm and dry Neuro: Alert and oriented x3, 4/5 strength in left knee extension, gait not observed Psych: normal mood and affect  Labs and Imaging: CBC BMET   Recent Labs Lab 05/17/16 1018  WBC 7.7  HGB 15.5   HCT 46.9  PLT 251    Recent Labs Lab 05/17/16 1018  NA 142  K 4.5  CL 106  CO2 30  BUN <5*  CREATININE 0.75  GLUCOSE 143*  CALCIUM 9.3     Dg Chest 2  View  Result Date: 05/17/2016 CLINICAL DATA:  Acute right arm and chest pain intermittently for 2 weeks. COPD. EXAM: CHEST  2 VIEW COMPARISON:  01/14/2015 FINDINGS: Chronic diffuse COPD/ emphysema as before with peripheral interstitial fibrosis/ scarring. Nipple shadows noted bilaterally. No definite focal pneumonia, collapse or consolidation. Negative for edema, effusion or pneumothorax. Normal heart size and vascularity. Trachea is midline. Minor thoracic spondylosis. IMPRESSION: Stable COPD/emphysema and interstitial fibrotic pattern. No superimposed acute process Electronically Signed   By: Jerilynn Mages.  Shick M.D.   On: 05/17/2016 12:04   Lipase     Component Value Date/Time   LIPASE 740 (H) 05/17/2016 [place], MD 05/17/2016, 6:41 PM PGY-1, [place] Intern pager: [phone], text pages welcome  I have separately seen and examined the patient. I have discussed the findings and exam with Dr Rosalyn Gess and agree with the above note.  My changes/additions are outlined in BLUE.   Smitty Cords, MD [place], PGY-2

## 2016-05-17 NOTE — Progress Notes (Signed)
Patient trasfered from ED to 5W26 via wheelchair; alert and oriented x 4; no complaints of pain; IV saline locked in LAC; skin intact. Orient patient to room and unit; gave patient care guide; instructed how to use the call bell and  fall risk precautions. Tele box 27 placed by MD order; IV fluids started per MD order. Will continue to monitor the patient.

## 2016-05-17 NOTE — ED Notes (Signed)
Nurse will draw labs. 

## 2016-05-18 DIAGNOSIS — E440 Moderate protein-calorie malnutrition: Secondary | ICD-10-CM

## 2016-05-18 HISTORY — DX: Moderate protein-calorie malnutrition: E44.0

## 2016-05-18 LAB — CBC
HCT: 42.7 % (ref 39.0–52.0)
Hemoglobin: 14.1 g/dL (ref 13.0–17.0)
MCH: 31.9 pg (ref 26.0–34.0)
MCHC: 33 g/dL (ref 30.0–36.0)
MCV: 96.6 fL (ref 78.0–100.0)
Platelets: 228 10*3/uL (ref 150–400)
RBC: 4.42 MIL/uL (ref 4.22–5.81)
RDW: 14.7 % (ref 11.5–15.5)
WBC: 9.5 10*3/uL (ref 4.0–10.5)

## 2016-05-18 LAB — COMPREHENSIVE METABOLIC PANEL
ALT: 10 U/L — ABNORMAL LOW (ref 17–63)
AST: 16 U/L (ref 15–41)
Albumin: 3.4 g/dL — ABNORMAL LOW (ref 3.5–5.0)
Alkaline Phosphatase: 41 U/L (ref 38–126)
Anion gap: 8 (ref 5–15)
BUN: 6 mg/dL (ref 6–20)
CO2: 26 mmol/L (ref 22–32)
Calcium: 9 mg/dL (ref 8.9–10.3)
Chloride: 106 mmol/L (ref 101–111)
Creatinine, Ser: 0.72 mg/dL (ref 0.61–1.24)
GFR calc Af Amer: >60 mL/min (ref >60)
GFR calc non Af Amer: >60 mL/min (ref >60)
Glucose, Bld: 103 mg/dL — ABNORMAL HIGH (ref 65–99)
Potassium: 4.7 mmol/L (ref 3.5–5.1)
Sodium: 140 mmol/L (ref 135–145)
Total Bilirubin: 0.9 mg/dL (ref 0.3–1.2)
Total Protein: 6.3 g/dL — ABNORMAL LOW (ref 6.5–8.1)

## 2016-05-18 LAB — GLUCOSE, CAPILLARY
Glucose-Capillary: 99 mg/dL (ref 65–99)
Glucose-Capillary: 230 mg/dL — ABNORMAL HIGH (ref 65–99)
Glucose-Capillary: 158 mg/dL — ABNORMAL HIGH (ref 65–99)
Glucose-Capillary: 159 mg/dL — ABNORMAL HIGH (ref 65–99)

## 2016-05-18 LAB — TROPONIN I: Troponin I: <0.03 ng/mL (ref <0.03)

## 2016-05-18 LAB — LIPASE, BLOOD: Lipase: 105 U/L — ABNORMAL HIGH (ref 11–51)

## 2016-05-18 MED ORDER — POLYETHYLENE GLYCOL 3350 17 G PO PACK: 17.0000 g | PACK | Freq: Every day | ORAL | Status: DC | PRN

## 2016-05-18 MED ORDER — DOCUSATE SODIUM 100 MG PO CAPS
100.0000 mg | ORAL_CAPSULE | Freq: Every day | ORAL | Status: DC
  Administered 2016-05-18: 100 mg via ORAL
  Filled 2016-05-18 (×2): qty 1

## 2016-05-18 MED ORDER — OXYCODONE HCL 5 MG PO TABS
5.0000 mg | ORAL_TABLET | ORAL | Status: DC | PRN
  Administered 2016-05-18 – 2016-05-19 (×3): 5 mg via ORAL
  Filled 2016-05-18 (×3): qty 1

## 2016-05-18 NOTE — Progress Notes (Signed)
OT Cancellation Note  Patient Details Name: Timothy Martinez MRN: [id number] DOB: 1950-08-18   Cancelled Treatment:    Reason Eval/Treat Not Completed: OT screened, no needs identified, will sign off  Malka So 05/18/2016, 4:00 PM

## 2016-05-18 NOTE — Evaluation (Signed)
Physical Therapy Evaluation Patient Details Name: Timothy Martinez MRN: [id number] DOB: 13-Jan-1951 Today's Date: 05/18/2016   History of Present Illness  Timothy Martinez is a 65 y.o. male presenting with chest and abdominal pain  . PMH is significant for Chronic pancreatitis, atrial fibrillation on xarelto, HLD, T2DM, chronic back pain  Clinical Impression  Pt is at or close to baseline functioning and should be safe at home . There are no further acute PT needs.  Will sign off at this time.     Follow Up Recommendations No PT follow up    Equipment Recommendations  None recommended by PT    Recommendations for Other Services       Precautions / Restrictions Precautions Precautions: None      Mobility  Bed Mobility Overal bed mobility: Independent                Transfers Overall transfer level: Independent                  Ambulation/Gait Ambulation/Gait assistance: Independent Ambulation Distance (Feet): 800 Feet Assistive device: None Gait Pattern/deviations: WFL(Within Functional Limits)   Gait velocity interpretation: >2.62 ft/sec, indicative of independent community ambulator    Stairs Stairs: Yes Stairs assistance: Modified independent (Device/Increase time) Stair Management: One rail Right;Alternating pattern;Forwards Number of Stairs: 4 General stair comments: safe with rail  Wheelchair Mobility    Modified Rankin (Stroke Patients Only)       Balance Overall balance assessment: No apparent balance deficits (not formally assessed)                               Standardized Balance Assessment Standardized Balance Assessment : Dynamic Gait Index   Dynamic Gait Index Level Surface: Normal Change in Gait Speed: Normal Gait with Horizontal Head Turns: Mild Impairment Gait with Vertical Head Turns: Normal Gait and Pivot Turn: Normal Step Over Obstacle: Normal Step Around Obstacles: Normal Steps: Mild Impairment Total  Score: 22       Pertinent Vitals/Pain Pain Assessment: Faces Faces Pain Scale: Hurts little more Pain Location: stomach/epigastric Pain Descriptors / Indicators: Discomfort Pain Intervention(s): Monitored during session    Home Living Family/patient expects to be discharged to:: Private residence Living Arrangements: Alone   Type of Home: Apartment Home Access: Stairs to enter Entrance Stairs-Rails: Psychiatric nurse of Steps: 17 Home Layout: One level Home Equipment: Cane - single point      Prior Function Level of Independence: Independent               Hand Dominance        Extremity/Trunk Assessment   Upper Extremity Assessment: Overall WFL for tasks assessed           Lower Extremity Assessment: Overall WFL for tasks assessed      Cervical / Trunk Assessment: Normal  Communication   Communication: No difficulties  Cognition Arousal/Alertness: Awake/alert Behavior During Therapy: WFL for tasks assessed/performed Overall Cognitive Status: Within Functional Limits for tasks assessed                      General Comments      Exercises     Assessment/Plan    PT Assessment Patent does not need any further PT services  PT Problem List            PT Treatment Interventions      PT Goals (Current goals can be  found in the Care Plan section)  Acute Rehab PT Goals PT Goal Formulation: All assessment and education complete, DC therapy    Frequency     Barriers to discharge        Co-evaluation               End of Session   Activity Tolerance: Patient tolerated treatment well Patient left: in bed Nurse Communication: Mobility status         Time: 7793-9688 PT Time Calculation (min) (ACUTE ONLY): 14 min   Charges:   PT Evaluation $PT Eval Low Complexity: 1 Procedure     PT G Codes:        Ashika Apuzzo, Tessie Fass 05/18/2016, 11:07 AM 05/18/2016  Donnella Sham,  PT [phone]  (pager)

## 2016-05-18 NOTE — Progress Notes (Signed)
Transitions of Care Pharmacy Note  Educated on:  - Importance of taking docusate daily if taking pain medications regularly to prevent constipation. If he has not had a bowel movement in 2-3 days, he should go to the pharmacy and get recommendation for second line option or call his provider.  - Smoking cessation associated health benefits. He shared that he currently smokes two packs per day, which qualifies him for the '21mg'$ /day patch dose. Patient did not think Medicare would cover the patches. Called his pharmacy and said they were usually covered if prior authorization was completed. - Importance of influenza vaccinations, especially in setting of asplenia.   Recommend - Prescribing docusate on discharge if oxycodone is to continue (sometimes insurance will cover with an Rx).  - If nicotine patch is continue on discharge, start with Nicotine '21mg'$ /day patch dose. I also recommended to patient to discuss other pharmacologic options with PCP (ex: bupropion) if feeling motivated to quit in the future, as these would likely be covered by his insurance without additional paperwork.  - Influenza vaccine before discharge if he is willing to get it.   Timothy Martinez is an 65 y.o. male who presents with a chief complaint of pancreatitis. In anticipation of discharge, pharmacy has reviewed this patient's prior to admission medication history, as well as current inpatient medications listed per the [facility].  Current medication indications, dosing, frequency, and notable side effects reviewed with patient. patient verbalized understanding of current inpatient medication regimen and is aware that the After Visit Summary when presented, will represent the most accurate medication list at discharge.   Timothy Martinez and I discussed the following:  - Stated docusate helped him to have a BM today. In the past, he has gone several days to weeks without having a BM from pain medication use.  - Patient stated that he  didn't think the nicotine patch was working, but didn't currently feel like smoking. Explained that this could in part be from the nicotine patch improving his cravings. Told him that a higher dose may have an even more beneficial effect.  - Patient had belief that his last influenza vaccine gave him the flu. Discussed that symptoms of the flu were a normal immune response, and that the vaccine could protect him from getting a severe case of the flu in the future.   Assessment: Understanding of regimen: good Understanding of indications: good Potential of compliance: good Barriers to Obtaining Medications: Yes - has attempted to get nicotine patches in the past but they were not covered. Called pharmacy and they likely need a PA.   Patient instructed to contact inpatient pharmacy team with further questions or concerns if needed.    Time spent preparing for discharge counseling: 10 minutes Time spent counseling patient: 15 minutes    Thank you for allowing pharmacy to be a part of this patient's care.  Demetrius Charity, PharmD Acute Care Pharmacy Resident  Pager: [phone] 05/18/2016

## 2016-05-18 NOTE — Progress Notes (Signed)
Initial Nutrition Assessment  DOCUMENTATION CODES:   Non-severe (moderate) malnutrition in context of chronic illness  INTERVENTION:   -Continue Ensure Enlive po BID, each supplement provides 350 kcal and 20 grams of protein  NUTRITION DIAGNOSIS:   Malnutrition related to chronic illness as evidenced by mild depletion of muscle mass, mild depletion of body fat.  GOAL:   Patient will meet greater than or equal to 90% of their needs  MONITOR:   PO intake, Supplement acceptance, Labs, Weight trends, Skin, I & O's  REASON FOR ASSESSMENT:   Malnutrition Screening Tool    ASSESSMENT:   Timothy Martinez is a 65 y.o. male presenting with chest and abdominal pain  . PMH is significant for Chronic pancreatitis, atrial fibrillation on xarelto, HLD, T2DM, chronic back pain  Pt admitted with acute on chronic alcoholic pancreatitis.   Spoke with pt at bedside. He reports no changes in appetite PTA. He typically consumes 2 meals per day, which consist of tomato and bologna sandwich and french fries or chips. Pt shares that he lost 25-50# since the first of the year, related to a previous hospitalization for pancreatitis and  a-fib. Pt denies any changes in intake or activity level and reports no rationale for weight loss. However, wt hx reveals wt stability over the past year.   Pt denies any changes in activity level; shares that he was chopping wood PTA. Nutrition-Focused physical exam completed. Findings are mild to moderate fat depletion, mild to moderate muscle depletion, and no edema.   Pt reports he really enjoys Ensure supplements and would like to continue with them. Suspect intake may be inadequate related to ETOH abuse. Encouraged pt to continue supplements after discharge.   Medications reviewed and include folvite, MVI, and vitamin B-1.   Labs reviewed.   Diet Order:  DIET SOFT Room service appropriate? Yes; Fluid consistency: Thin  Skin:  Reviewed, no issues  Last BM:   05/17/16  Height:   Ht Readings from Last 1 Encounters:  05/17/16 '6\' 3"'$  (1.905 m)    Weight:   Wt Readings from Last 1 Encounters:  05/17/16 184 lb 9.6 oz (83.7 kg)    Ideal Body Weight:  89.1 kg  BMI:  Body mass index is 23.07 kg/m.  Estimated Nutritional Needs:   Kcal:  2000-2200  Protein:  110-125 grams  Fluid:  2.0-2.2 L  EDUCATION NEEDS:   Education needs addressed  Miyoshi Ligas A. Jimmye Norman, RD, LDN, CDE Pager: [phone] After hours Pager: [phone]

## 2016-05-18 NOTE — Progress Notes (Signed)
Family Medicine Teaching Service Daily Progress Note Intern Pager: [phone]  Patient name: Timothy Martinez Medical record number: [id number] Date of birth: [birth year] Age: 65 y.o. Gender: male  Primary Care Provider: Dimas Chyle, MD Consultants: None  Code Status: Full    Assessment and Plan:  Timothy Martinez is a 65 y.o. male with a past medical history significant recurrent pancreatitis, atrial fibrillation, HLD, T2DM, chronic back pain who presented with severe epigastric pain radiating to his back consistent with pancreatitis.   #Alcoholic pancreatitis, recurrent Patient presented  with severe epigastric pain radiating to his back. History of multiple prior admission for alcoholic pancreatitis and pancreatic pseudocyst in 2016. Lipase moderately elevated on admission 740. Clinical presentation consistent with pancreatitis. LDH 121  --Make patient NPO, will cautiously advance diet with history --Continue NS 125 cc/hr, will decrease if patient taking po, hx of HF  --F/U on am CBC CMP lipase --Will discontinue Dilaudid 0.'5mg'$  q4hrs PRN  --Start oxycodone 5 mg q4 po --Continue Tylenol 650 mg q6 prn --Start Bowel regimen Miralax and colace  #Chest pain, acute  Likely secondary to acute on chronic pancreatitis. EKG with NSR. Trop <0.03 (x2) --Continue cardiac monitoring --F/u on repeat EKG  --Start oxycodone 5 mg q4 po  #Atrial fibrillation, rate controlled  Since admission HR between 56-69. Rate well controlled on current regimen. --Will continue metoprolol 75 mg bid --Will continue xarelto '20mg'$  daily   #HFpEF, stable  Echo (12/30/14) EF 55-60%; moderate LVH  --Continue home Metoprolol 75 mg BID  #COPD Former smoker. Maintaining good oxygenation. Will continue to monitor.  --Continue albuterol PRN  #Hyperlipidemia --Continue lipitor 40 mg daily  #T2DM   Hgb A1C: 6 on 01/2016.   --Hold home Metformin while admitted   --Continue SSI --CBGs  #Chronic back pain:   History of back surgery.  Takes oxycodone IR '5mg'$  q4hrs PRN. Patient has some LLE weakness 4/5.   --Restart oxycodone 5 mg q4 po --PT/OT  #Alcohol abuse:  Patient with history of alcohol abusive leading to multiple hospitalization for alcoholic pancreatitis --Continue CIWA protocol  --Continue Folic acid, Thiamine  #Tobacco Abuse --Nicotine patch --Smoking cessation counseling  FEN/GI: Soft diet/NS @ 125cc/hr Prophylaxis: xarelto  Disposition: Admit to FMTS, for acute pancreatitis management   Subjective:  No acute events overnight. Patient states that pain is better compared to yesterday. Patient still has some epigastric pain radiating to his back and flank. Chest pain has resolved completely at the moment. Patient has appetite and would like to eat.  Objective: Temp:  [97.6 F (36.4 C)-97.9 F (36.6 C)] 97.9 F (36.6 C) (10/24 2208) Pulse Rate:  [56-69] 57 (10/24 2208) Resp:  [16-21] 17 (10/24 2208) BP: (123-144)/(69-84) 132/69 (10/24 2208) SpO2:  [89 %-100 %] 96 % (10/24 2208) Weight:  [184 lb 9.6 oz (83.7 kg)] 184 lb 9.6 oz (83.7 kg) (10/24 1634) Physical Exam: General: 65yo M lying in bed appearing comfortable, in NAD Eyes: EOMI, PERRL ENTM: MMM, clear oropharynx Neck: supple, no LAD Cardiovascular: RRR, no murmurs, rubs or gallops Respiratory: normal work of breathing, CTABL, no wheezing or rhonci Gastrointestinal: well-healed transverse abdominal scar, soft, mildly tender epigastric pain radiating to back and flank, no peritoneal signs, non-distended MSK: 4/5 strength LLE, otherwise 5/5 throughout.   Derm: warm and dry Neuro: Alert and oriented x3, 4/5 strength in left knee extension, gait not observed Psych: normal mood and affect Laboratory:  Recent Labs Lab 05/17/16 1018  WBC 7.7  HGB 15.5  HCT 46.9  PLT 251  Recent Labs Lab 05/17/16 1018  NA 142  K 4.5  CL 106  CO2 30  BUN <5*  CREATININE 0.75  CALCIUM 9.3  PROT 7.5  BILITOT 0.8   ALKPHOS 49  ALT 12*  AST 21  GLUCOSE 143*   Lipase:740  Imaging/Diagnostic Tests: Dg Chest 2 View  Result Date: 05/17/2016 CLINICAL DATA:  Acute right arm and chest pain intermittently for 2 weeks. COPD. EXAM: CHEST  2 VIEW COMPARISON:  01/14/2015 FINDINGS: Chronic diffuse COPD/ emphysema as before with peripheral interstitial fibrosis/ scarring. Nipple shadows noted bilaterally. No definite focal pneumonia, collapse or consolidation. Negative for edema, effusion or pneumothorax. Normal heart size and vascularity. Trachea is midline. Minor thoracic spondylosis. IMPRESSION: Stable COPD/emphysema and interstitial fibrotic pattern. No superimposed acute process Electronically Signed   By: Jerilynn Mages.  Shick M.D.   On: 05/17/2016 12:04     Marjie Skiff, MD 05/18/2016, 5:06 AM PGY-1, [place] Intern pager: [phone], text pages welcome

## 2016-05-19 LAB — GLUCOSE, CAPILLARY
Glucose-Capillary: 120 mg/dL — ABNORMAL HIGH (ref 65–99)
Glucose-Capillary: 187 mg/dL — ABNORMAL HIGH (ref 65–99)

## 2016-05-19 MED ORDER — IPRATROPIUM-ALBUTEROL 0.5-2.5 (3) MG/3ML IN SOLN: 3.0000 mL | Freq: Every day | RESPIRATORY_TRACT | Status: DC

## 2016-05-19 MED ORDER — OXYCODONE HCL 5 MG PO TABS: 5.0000 mg | ORAL_TABLET | Freq: Four times a day (QID) | ORAL | 0 refills | Status: AC | PRN

## 2016-05-19 NOTE — Progress Notes (Signed)
Family Medicine Teaching Service Daily Progress Note Intern Pager: [phone]  Patient name: Timothy Martinez Medical record number: [id number] Date of birth: [birth year] Age: 65 y.o. Gender: male  Primary Care Provider: Dimas Chyle, MD Consultants: None  Code Status: Full    Assessment and Plan:  Timothy Martinez is a 65 y.o. male with a past medical history significant recurrent pancreatitis, atrial fibrillation, HLD, T2DM, chronic back pain who presented with severe epigastric pain radiating to his back consistent with pancreatitis.   #Alcoholic pancreatitis, recurrent Pain much improved.  Tolerating diet and asking to go home.  --continue oxycodone 5 mg q4 po --Continue Tylenol 650 mg q6 prn --continue bowel regimen Miralax and colace  #Chest pain, acute  Likely secondary to acute on chronic pancreatitis. EKG with NSR. Trop <0.03 (x2) --Continue cardiac monitoring --F/u on repeat EKG  --Start oxycodone 5 mg q4 po  #Atrial fibrillation, rate controlled  Since admission HR between 56-69. Rate well controlled on current regimen. --Will continue metoprolol 75 mg bid --Will continue xarelto '20mg'$  daily   #HFpEF, stable  Echo (12/30/14) EF 55-60%; moderate LVH  --Continue home Metoprolol 75 mg BID  #COPD Former smoker. Maintaining good oxygenation. Will continue to monitor.  --Continue albuterol PRN  #Hyperlipidemia --Continue lipitor 40 mg daily  #T2DM   Hgb A1C: 6 on 7/201   --Hold home Metformin while admitted   --Continue SSI --CBGs  #Chronic back pain:  History of back surgery.  Takes oxycodone IR '5mg'$  q4hrs PRN. Patient has some LLE weakness 4/5.   --Restart oxycodone 5 mg q4 po  #Alcohol abuse:  Patient with history of alcohol abusive leading to multiple hospitalization for alcoholic pancreatitis --Continue CIWA protocol  --Continue Folic acid, Thiamine  #Tobacco Abuse --Nicotine patch --Smoking cessation counseling  FEN/GI: Soft diet/NS @  125cc/hr Prophylaxis: xarelto  Disposition: Admit to FMTS, for acute pancreatitis management   Subjective:  No acute events overnight. Patient states that pain is better compared to yesterday. Ready to go home.   Objective: Temp:  [97.5 F (36.4 C)-98.2 F (36.8 C)] 97.5 F (36.4 C) (10/26 0732) Pulse Rate:  [60-63] 62 (10/26 0732) Resp:  [16-18] 18 (10/26 0732) BP: (124-140)/(62-87) 128/74 (10/26 0732) SpO2:  [93 %-95 %] 95 % (10/26 0732) Physical Exam: General: 65yo M lying in bed appearing comfortable, in NAD Eyes: EOMI, PERRL ENTM: MMM, clear oropharynx Neck: supple, no LAD Cardiovascular: RRR, no murmurs, rubs or gallops Respiratory: normal work of breathing, CTABL, no wheezing or rhonci Gastrointestinal: well-healed transverse abdominal scar, soft, mildly tender epigastric pain, no peritoneal signs, non-distended MSK: 4/5 strength LLE, otherwise 5/5 throughout.   Derm: warm and dry Neuro: Alert and oriented x3, 4/5 strength in left knee extension, gait not observed Psych: normal mood and affect Laboratory:  Recent Labs Lab 05/17/16 1018 05/18/16 0532  WBC 7.7 9.5  HGB 15.5 14.1  HCT 46.9 42.7  PLT 251 228    Recent Labs Lab 05/17/16 1018 05/18/16 0532  NA 142 140  K 4.5 4.7  CL 106 106  CO2 30 26  BUN <5* 6  CREATININE 0.75 0.72  CALCIUM 9.3 9.0  PROT 7.5 6.3*  BILITOT 0.8 0.9  ALKPHOS 49 41  ALT 12* 10*  AST 21 16  GLUCOSE 143* 103*   Lipase:740  Imaging/Diagnostic Tests: No results found.   Eloise Levels, MD 05/19/2016, 11:33 AM PGY-1, [place] Intern pager: [phone], text pages welcome

## 2016-05-19 NOTE — Discharge Instructions (Signed)
You were admitted to the hospital for pancreatitis and we gave you pain medicine and fluids and you are showing improvement.  I have sent you home with a few days of pain medicine.  If you develop these symptoms again, I would recommend coming back to the emergency department to be evaluated.

## 2016-05-19 NOTE — Progress Notes (Signed)
Pt given discharge instructions, prescriptions, and care notes. Pt verbalized understanding AEB no further questions or concerns at this time. IV was discontinued, no redness, pain, or swelling noted at this time. Telemetry discontinued and Centralized Telemetry was notified. Pt left the floor with staff in stable condition. 

## 2016-05-20 ENCOUNTER — Telehealth: Payer: Self-pay | Admitting: *Deleted

## 2016-05-20 NOTE — Telephone Encounter (Signed)
Patient returns call.

## 2016-05-20 NOTE — Telephone Encounter (Signed)
[facility] Post-discharge Follow-Up Phone Call:  Date of Discharge: 05/19/2016 Principal Discharge Diagnosis(es): Acute pancreatitis Post-discharge Communication: Attempt #1 to reach patient and complete post-discharge follow-up phone call. Call placed to [phone]; unable to reach patient. HIPPA compliant voicemail left requesting return call. Call Completed: No

## 2016-05-23 ENCOUNTER — Telehealth: Payer: Self-pay | Admitting: Cardiovascular Disease

## 2016-05-23 NOTE — Telephone Encounter (Signed)
Spoke with Rodena Piety, RN with [facility] who called to follow-up on patient since d/c from hospital.  She states the patient denies complaints but she wanted to document that no follow up is needed. Last EKG in hospital on 10/25 shows NSR.  I advised that he was advised to follow up yearly and was last seen January 28, 2016.  She states she will advise patient and document that patient may call in for sooner appointment if he has concerns, otherwise he will follow up July 2018.  She thanked me for the call.

## 2016-05-23 NOTE — Telephone Encounter (Signed)
[facility]itional [facility] Clinic Post-discharge Follow-Up Phone Call:  Date of Discharge: 05/19/2016 Principal Discharge Diagnosis(es): Acute pancreatitis Post-discharge Communication: Attempt #2 to reach patient and complete post-discharge follow-up phone call. Call placed to [phone]; unable to reach patient. HIPPA compliant voicemail left requesting return call. Call Completed: No

## 2016-05-23 NOTE — Telephone Encounter (Signed)
He should follow up with his medical doctor and [place]

## 2016-05-23 NOTE — Telephone Encounter (Signed)
Transitional Care Clinic Post-discharge Follow-Up Phone Call:  Date of Discharge:05/19/2016 Principal Discharge Diagnosis(es):Acute pancreatitis Post-discharge Communication: Attempt #3 to reach patient and complete post-discharge follow-up phone call. Call placed to [phone]; unable to reach patient. HIPPA compliant voicemail left requesting return call. Call Completed: No

## 2016-05-23 NOTE — Telephone Encounter (Signed)
Rodena Piety is calling to know if the patient needs to come in for a follow up visit after being in the hospital . Was discharged on 05/19/16 .  Please call   Thanks

## 2016-05-24 NOTE — Telephone Encounter (Signed)
Transitional Care Post-Discharge Follow-Up Phone Call:   Date of Discharge: 05/19/2016  Principal Discharge Diagnosis: Acute pancreatitis  Reason for Chronic Case Management: Co-morbidities: Atrial Flutter, Chronic Atrial fibrillation, COPD,Chronic alcoholic pancreatitis, H0TU, Hyperlipidemia, chronic anticoagulation; 2 Hospital Admissions and 1 ED visit < 1 year  Post-discharge Communication: (Clearly document all attempts clearly and date contact made) Please see 3 attempted calls prior to connecting with patient today.  Call Completed: Yes, with patient on 05/24/2016 from 1050-1100  Please check all that apply:  X Patient is knowledgeable of his/her condition(s) and/or treatment.  ? Family and/or caregiver is knowledgeable of patient's condition(s) and/or treatment.  X Patient is caring for self at home.  ? Patient is receiving home health services. If so, name of agency.   Medication Reconciliation:  X Medication list reviewed with patient.  X Patient able to obtain needed medications. Patient has all medications and is taking as directed  Activities of Daily Living:  X  Independent  ? Needs assist (describe)  ? Total Care (describe)   Community resources in place for patient:  X None  ? Home Health  ? Assisted Living  ? Hospice  ? Support Group    Topics discussed: Patient states he is feeling "real good." States he's taking all his medications, however, he did not need to take his "pain pill" yesterday and doesn't think he'll need to take one today. Patient denies any questions or concerns at this time. Appt made with PCP for 05/30/2016 at 1:45 PM. Patient states he has transportation and is able to drive himself to this appt.  Hubbard Hartshorn, RN, BSN

## 2016-05-24 NOTE — Discharge Summary (Signed)
[facility] Discharge Summary  Patient name: Timothy Martinez Medical record number: [id number] Date of birth: Jun 05, 1951 Age: 65 y.o. Gender: male Date of Admission: 05/17/2016  Date of Discharge: 05/19/16 Admitting Physician: Blane Ohara McDiarmid, MD  Primary Care Provider: Dimas Chyle, MD Consultants: None  Indication for Hospitalization: abdominal pain  Discharge Diagnoses/Problem List:  Patient Active Problem List   Diagnosis Date Noted  . Malnutrition of moderate degree 05/18/2016  . Acute pancreatitis 05/17/2016  . Acute on chronic pancreatitis ([place]) 05/17/2016  . Chronic atrial fibrillation ([place])   . Chronic anticoagulation   . Insomnia 02/11/2016  . History of atrial fibrillation 06/19/2015  . On continuous oral anticoagulation 06/19/2015  . Neuropathy ([place]) 03/16/2015  . Hematuria, microscopic 02/13/2015  . Anxiety and depression 01/20/2015  . Alcohol-induced acute pancreatitis   . Neck pain, bilateral posterior 10/15/2014  . Chest pain   . Atrial flutter, unspecified   . Atrial fibrillation with rapid ventricular response ([place])   . COLD (chronic obstructive lung disease) ([place])   . Pancreatitis 08/04/2014  . Left foot pain 10/12/2013  . Low back pain 12/21/2012  . Polyuria 08/21/2012  . COPD (chronic obstructive pulmonary disease) ([place]) 04/27/2012  . GERD (gastroesophageal reflux disease) 03/05/2012  . Alcohol abuse 06/09/2011  . Diabetes mellitus ([place]) 05/30/2011  . Chronic alcoholic pancreatitis ([place]) 05/30/2011  . Hyperlipidemia 05/30/2011  . Tobacco abuse 05/30/2011     Disposition: discharge home  Discharge Condition: stable, improved  Discharge Exam:  General: 65yo M lying in bed appearing comfortable, in NAD Eyes: EOMI, PERRL ENTM: MMM, clear oropharynx Neck: supple, no LAD Cardiovascular: RRR, no murmurs, rubs or gallops Respiratory: normal work of breathing, CTABL, no wheezing or rhonci Gastrointestinal: well-healed  transverse abdominal scar, soft, mildly tender epigastric pain, no peritoneal signs, non-distended MSK: 4/5 strength LLE, otherwise 5/5 throughout.  Derm: warm and dry Neuro: Alert and oriented x3, 4/5 strength in left knee extension, gait not observed Psych: normal mood and affect  Brief Hospital Course:  65yo M presenting with burning chest pain that radiated to his back and then moved into his epigastrium and sides of his abdomen consistent with previous bouts of pancreatitis. Lipase was moderately elevated to 740 upon admission.  Patient was allowed to eat initially because he was asking for it and clinical picture was mild. He was started on dilauded 0.39m q4hrs for pain control.  Chest pain work up to rule out ACS showed troponins <0.03 x3 and normal EKG. His atrial fibrillation was controlled during admission with his home medication and xarelto was continued. Patient did have a somewhat recent alcohol abuse history and was placed on CIWA and did not require any ativan during admission.  He was seen by physical therapy who recommended no follow up care.  Patient was transitioned to PO pain medication, however did not require any.  At the time of discharge he was without pain, afebrile for >24 hours and return precautions were discussed and understood.   Issues for Follow Up:  1. Pancreatitis: follow up with primary care doctor for post hospital follow up.   Significant Procedures: None  Significant Labs and Imaging:   Recent Labs Lab 05/18/16 0532  WBC 9.5  HGB 14.1  HCT 42.7  PLT 228    Recent Labs Lab 05/18/16 0532  NA 140  K 4.7  CL 106  CO2 26  GLUCOSE 103*  BUN 6  CREATININE 0.72  CALCIUM 9.0  ALKPHOS 41  AST 16  ALT 10*  ALBUMIN 3.4*    Dg Chest 2 View  Result Date: 05/17/2016 CLINICAL DATA:  Acute right arm and chest pain intermittently for 2 weeks. COPD. EXAM: CHEST  2 VIEW COMPARISON:  01/14/2015 FINDINGS: Chronic diffuse COPD/ emphysema as before with  peripheral interstitial fibrosis/ scarring. Nipple shadows noted bilaterally. No definite focal pneumonia, collapse or consolidation. Negative for edema, effusion or pneumothorax. Normal heart size and vascularity. Trachea is midline. Minor thoracic spondylosis. IMPRESSION: Stable COPD/emphysema and interstitial fibrotic pattern. No superimposed acute process Electronically Signed   By: Jerilynn Mages.  Shick M.D.   On: 05/17/2016 12:04   Results/Tests Pending at Time of Discharge: None  Discharge Medications:    Medication List    STOP taking these medications   ondansetron 4 MG disintegrating tablet Commonly known as:  ZOFRAN ODT     TAKE these medications   albuterol 108 (90 Base) MCG/ACT inhaler Commonly known as:  PROVENTIL HFA;VENTOLIN HFA Inhale 1-2 puffs into the lungs every 12 (twelve) hours as needed for wheezing or shortness of breath.   atorvastatin 40 MG tablet Commonly known as:  LIPITOR Take 1 tablet (40 mg total) by mouth daily at 6 PM.   cyanocobalamin 500 MCG tablet Take 500 mcg by mouth daily.   famotidine 40 MG tablet Commonly known as:  PEPCID TAKE 1 TABLET (40 MG TOTAL) BY MOUTH DAILY. What changed:  See the new instructions.   glucose blood test strip Commonly known as:  ONE TOUCH ULTRA TEST Use check fasting blood glucose qAM. Type 2 Diabetes E11.9   metFORMIN 500 MG tablet Commonly known as:  GLUCOPHAGE TAKE 2 TABLETS BY MOUTH 2 TIMES DAILY WITH A MEAL. What changed:  See the new instructions.   metoprolol 50 MG tablet Commonly known as:  LOPRESSOR Take 1.5 tablets (75 mg total) by mouth 2 (two) times daily.   ONE TOUCH LANCETS Misc Check fasting blood glucose qAM. Type 2 DM. E11.9   ONE TOUCH ULTRA 2 w/Device Kit Dispense one glucometer. Diagnosis Type 2 Diabetes. E11.9   oxyCODONE 5 MG immediate release tablet Commonly known as:  ROXICODONE Take 1 tablet (5 mg total) by mouth every 4 (four) hours as needed for severe pain.   rivaroxaban 20 MG Tabs  tablet Commonly known as:  XARELTO Take 1 tablet (20 mg total) by mouth daily with supper.     ASK your doctor about these medications   oxyCODONE 5 MG immediate release tablet Commonly known as:  Oxy IR/ROXICODONE Take 1 tablet (5 mg total) by mouth every 6 (six) hours as needed for moderate pain. Ask about: Should I take this medication?       Discharge Instructions: Please refer to Patient Instructions section of EMR for full details.  Patient was counseled important signs and symptoms that should prompt return to medical care, changes in medications, dietary instructions, activity restrictions, and follow up appointments.   Follow-Up Appointments:   Eloise Levels, MD 05/24/2016, 2:25 PM PGY-1, [place]

## 2016-05-30 ENCOUNTER — Telehealth: Payer: Self-pay | Admitting: *Deleted

## 2016-05-30 ENCOUNTER — Encounter: Payer: Self-pay | Admitting: Family Medicine

## 2016-05-30 ENCOUNTER — Ambulatory Visit (INDEPENDENT_AMBULATORY_CARE_PROVIDER_SITE_OTHER): Payer: Medicare Other | Admitting: Family Medicine

## 2016-05-30 VITALS — BP 118/64 | HR 56 | Wt 178.0 lb

## 2016-05-30 DIAGNOSIS — K860 Alcohol-induced chronic pancreatitis: Secondary | ICD-10-CM

## 2016-05-30 DIAGNOSIS — M79601 Pain in right arm: Secondary | ICD-10-CM | POA: Insufficient documentation

## 2016-05-30 HISTORY — DX: Pain in right arm: M79.601

## 2016-05-30 MED ORDER — TRAMADOL HCL 50 MG PO TABS: 50.0000 mg | ORAL_TABLET | Freq: Four times a day (QID) | ORAL | 0 refills | Status: DC | PRN

## 2016-05-30 NOTE — Progress Notes (Signed)
TRANSITION OF CARE   Primary Care Physician (PCP): Timothy Chyle, MD                                                   [facility]                                                   [place], [place] [address]                                                   [place]   Type of visit: (Telephone/Face-to-face): Face to Face   Date of Admission: 05/14/2016  Date of Discharge: 05/19/2016  Discharged from: Hospital  Discharge Diagnosis: Pancreatitis   Summary of Admission: Patient admitted with epigastric pain and was found to have pancreatitis. He symptoms were managed medically with antiemetics and narcotics. He improved and was discharged home on oral antiemetics and pain medications.    TODAY's VISIT  Patient/Caregiver self-reported problems/concerns:   Right Arm Pain: Patient also with complain of right arm pain that has been going on for the past month. Pain located mostly at his right elbow and is intermittent in nature. Feels sharp. He has not tried any treatments. PAin goes away while chopping wood. Pain is worse at night and after waking up. He occasionally has mild numbness and tingling in the fingers of that hand. No weakness. No fevers or chills.   Pancreatitis: Since being discharged, he has done well. He has had to use the oxycodone which relieved his pain, though he is now out. Pain is a little worse at night and after eating. No nausea or vomiting. Pain can be up to 8/10 at its worst.   PHYSICAL EXAM:  Blood pressure 118/64, pulse (!) 56, weight 178 lb (80.7 kg), SpO2 92 %. Gen: NAD, sitting in chair comfortably CV: Irregularly irregular, no murmurs PULM: CTAB, NWOB GI: normal bowel sounds. Tender to palpation over epigastric area. Soft, nondistended. EXT: - Right arm: No deformities. Nontender to palpation. No pain at elbow joint with resisted wrist flexion/extension/pronation/supination. Strength 5/5  throughout. - Neck: No deformities. Spurling's negative bilaterally.   Neuro: Grossly normal, moves all extremities Psych: appropriate affect. Normal though content.   ASSESSMENT  Patient Medical Status: Stable  Active Diagnoses:  Patient Active Problem List   Diagnosis Date Noted  . Malnutrition of moderate degree 05/18/2016  . Acute pancreatitis 05/17/2016  . Acute on chronic pancreatitis ([place]) 05/17/2016  . Chronic atrial fibrillation ([place])   . Chronic anticoagulation   . Insomnia 02/11/2016  . History of atrial fibrillation 06/19/2015  . On continuous oral anticoagulation 06/19/2015  . Neuropathy ([place]) 03/16/2015  . Hematuria, microscopic 02/13/2015  . Anxiety and depression 01/20/2015  . Alcohol-induced  acute pancreatitis   . Neck pain, bilateral posterior 10/15/2014  . Chest pain   . Atrial flutter, unspecified   . Atrial fibrillation with rapid ventricular response ([place])   . COLD (chronic obstructive lung disease) ([place])   . Pancreatitis 08/04/2014  . Left foot pain 10/12/2013  . Low back pain 12/21/2012  . Polyuria 08/21/2012  . COPD (chronic obstructive pulmonary disease) ([place]) 04/27/2012  . GERD (gastroesophageal reflux disease) 03/05/2012  . Alcohol abuse 06/09/2011  . Diabetes mellitus ([place]) 05/30/2011  . Chronic alcoholic pancreatitis ([place]) 05/30/2011  . Hyperlipidemia 05/30/2011  . Tobacco abuse 05/30/2011   Chronic alcoholic pancreatitis ([place]) Symptoms improving, though still has occasional severe abdominal pain. Will give a short supply of tramadol. Instructed patient that he would not be able to get refills. Patient seems to have good insight into his alcohol abuse and its cause of his pancreatitis.  Pain of right upper extremity Unclear etiology though with negative physical exam today.  Possibly positional with given that patient sleeps on his right side. Asked patient to adjust sleep position to see if that improves his symptoms.     Does the  patient have the support of a caregiver?: No If yes, name of caregiver: N/A Describe level of support the caregiver provides: N/A Confidence of patient and/or caregiver to carry out care at home: N/A  Does patient live alone?: Yes If no, who does patient live with?: N/A  Does patient/ caregiver have concerns about access to food? No If yes, describe: N/A  Are there stairs in the home?: Yes. No difficulties with navigating stairs.   Is the home dwelling safe?: Yes If not safe, indicate concerns: N/A  Fall Risk Assessment: Mechanical fall last week after stepping on grease.    MEDICATIONS  Medication Reconciliation conducted with patient or caregiver? Yes/No: Yes  New medications prescribed/discontinued upon discharge ? Yes/No: No  Describe how patient takes medication: No issues/Concerns  Barriers identified related to [place] DME Ordered: Yes No If ordered, describe:   PATIENT EDUCATION PROVIDED: Yes  FOLLOW-UP : 1 month  SIGNATURE AND DATE:   Algis Greenhouse. Jerline Pain, [place] [facility] Resident PGY-3 05/30/2016 2:12 PM

## 2016-05-30 NOTE — Telephone Encounter (Signed)
Left massage on patient's VM reminding him of today's appt with PCP at 1:45 PM.  Hubbard Hartshorn, RN, BSN

## 2016-05-30 NOTE — Assessment & Plan Note (Signed)
Symptoms improving, though still has occasional severe abdominal pain. Will give a short supply of tramadol. Instructed patient that he would not be able to get refills. Patient seems to have good insight into his alcohol abuse and its cause of his pancreatitis.

## 2016-05-30 NOTE — Assessment & Plan Note (Signed)
Unclear etiology though with negative physical exam today.  Possibly positional with given that patient sleeps on his right side. Asked patient to adjust sleep position to see if that improves his symptoms.

## 2016-05-30 NOTE — Patient Instructions (Signed)
We will give you a prescription for tramadol today.  Avoiding alcohol is the best way to avoid having another episode of pancreatitis.  I think your arm pain is from your sleep position. Try changing your position while sleeping to see if it helps.  Come back to see me in a month, or sooner if you need anything else.  Take care,  Dr Jerline Pain

## 2016-06-22 ENCOUNTER — Telehealth: Payer: Self-pay | Admitting: Family Medicine

## 2016-06-22 NOTE — Telephone Encounter (Signed)
Will forward to Leory Plowman to reschedule this patient. Delta Deshmukh,CMA

## 2016-06-28 ENCOUNTER — Ambulatory Visit: Payer: Medicare Other | Admitting: Family Medicine

## 2016-08-30 LAB — HM DIABETES EYE EXAM

## 2016-09-05 ENCOUNTER — Other Ambulatory Visit: Payer: Self-pay | Admitting: Cardiovascular Disease

## 2016-11-24 ENCOUNTER — Ambulatory Visit: Payer: Medicare Other | Admitting: *Deleted

## 2016-12-01 ENCOUNTER — Encounter: Payer: Self-pay | Admitting: *Deleted

## 2016-12-01 ENCOUNTER — Ambulatory Visit (INDEPENDENT_AMBULATORY_CARE_PROVIDER_SITE_OTHER): Payer: Medicare Other | Admitting: *Deleted

## 2016-12-01 VITALS — BP 142/68 | HR 61 | Temp 97.8°F | Ht 75.0 in | Wt 178.4 lb

## 2016-12-01 DIAGNOSIS — Z0000 Encounter for general adult medical examination without abnormal findings: Secondary | ICD-10-CM | POA: Diagnosis not present

## 2016-12-01 DIAGNOSIS — E1140 Type 2 diabetes mellitus with diabetic neuropathy, unspecified: Secondary | ICD-10-CM | POA: Diagnosis not present

## 2016-12-01 LAB — POCT UA - MICROALBUMIN
Albumin/Creatinine Ratio, Urine, POC: <30
Creatinine, POC: 50 mg/dL
Microalbumin Ur, POC: 10 mg/L

## 2016-12-01 LAB — POCT GLYCOSYLATED HEMOGLOBIN (HGB A1C): Hemoglobin A1C: 6

## 2016-12-01 NOTE — Progress Notes (Signed)
Subjective:   Timothy Martinez is a 66 y.o. male who presents for an Initial Medicare Annual Wellness Visit.  Cardiac Risk Factors include: advanced age (>71mn, >>32women);diabetes mellitus;dyslipidemia;hypertension;male gender;smoking/ tobacco exposure    Objective:    Today's Vitals   12/01/16 0942 12/01/16 0944  BP:  (!) 142/68  Pulse:  61  Temp:  97.8 F (36.6 C)  TempSrc:  Oral  SpO2:  97%  Weight: 178 lb 6.4 oz (80.9 kg)   Height: '6\' 3"'  (1.905 m)   PainSc:  7    Body mass index is 22.3 kg/m.  Current Medications (verified) Outpatient Encounter Prescriptions as of 12/01/2016  Medication Sig  . albuterol (PROVENTIL HFA;VENTOLIN HFA) 108 (90 Base) MCG/ACT inhaler Inhale 1-2 puffs into the lungs every 12 (twelve) hours as needed for wheezing or shortness of breath.  .Marland Kitchenatorvastatin (LIPITOR) 40 MG tablet Take 1 tablet (40 mg total) by mouth daily at 6 PM.  . cyanocobalamin 500 MCG tablet Take 500 mcg by mouth daily.  . famotidine (PEPCID) 40 MG tablet TAKE 1 TABLET (40 MG TOTAL) BY MOUTH DAILY. (Patient taking differently: Take 40 mg by mouth every other day)  . metFORMIN (GLUCOPHAGE) 500 MG tablet TAKE 2 TABLETS BY MOUTH 2 TIMES DAILY WITH A MEAL. (Patient taking differently: Take 1,000 mg by mouth two times a day)  . metoprolol (LOPRESSOR) 50 MG tablet Take 1.5 tablets (75 mg total) by mouth 2 (two) times daily.  .Alveda Reasons20 MG TABS tablet TAKE 1 TABLET (20 MG TOTAL) BY MOUTH DAILY WITH SUPPER.  .Marland KitchenBlood Glucose Monitoring Suppl (ONE TOUCH ULTRA 2) W/DEVICE KIT Dispense one glucometer. Diagnosis Type 2 Diabetes. E11.9 (Patient not taking: Reported on 12/01/2016)  . glucose blood (ONE TOUCH ULTRA TEST) test strip Use check fasting blood glucose qAM. Type 2 Diabetes E11.9 (Patient not taking: Reported on 12/01/2016)  . ONE TOUCH LANCETS MISC Check fasting blood glucose qAM. Type 2 DM. E11.9 (Patient not taking: Reported on 12/01/2016)  . [DISCONTINUED] rivaroxaban (XARELTO) 20 MG  TABS tablet Take 1 tablet (20 mg total) by mouth daily with supper.  . [DISCONTINUED] traMADol (ULTRAM) 50 MG tablet Take 1 tablet (50 mg total) by mouth every 6 (six) hours as needed. (Patient not taking: Reported on 12/01/2016)   No facility-administered encounter medications on file as of 12/01/2016.     Allergies (verified) Tramadol; Prozac [fluoxetine hcl]; Morphine and related; and Penicillins   History: Past Medical History:  Diagnosis Date  . Alcohol abuse   . Alcoholic pancreatitis   . Anginal pain ([place]   . Arthritis    "eat up w/it" (05/17/2016)  . Atrial fibrillation status post cardioversion ([place] 08/2014  . CHF (congestive heart failure) ([place]   . Chronic back pain   . Chronic bronchitis ([place]   . COPD (chronic obstructive pulmonary disease) ([place]   . Daily headache    "need eye examined" (05/17/2016)  . GERD (gastroesophageal reflux disease)   . History of blood transfusion 2003   related to "spleen OR"  . Hyperlipidemia   . Hypertension   . Pancreatic pseudocyst    seen on CT scan 07/2014  . Pneumonia    "?a few times" (05/17/2016)  . Shortness of breath    with ambulation  . Tobacco use disorder, continuous   . Type II diabetes mellitus ([place]    Past Surgical History:  Procedure Laterality Date  . BACK SURGERY    . CARDIOVERSION N/A 09/22/2014   Procedure:  CARDIOVERSION;  Surgeon: Thayer Headings, MD;  Location: [facility];  Service: Cardiovascular;  Laterality: N/A;  . NECK SURGERY  1974   not cervical, states had lesion on neck which was removed  . POSTERIOR LAMINECTOMY / DECOMPRESSION LUMBAR SPINE  10/2006   of L4, L5 and S1 with a 5-1 diskectomy, microdissection with the microscope/notes 10/24/2006  . SPLENECTOMY  2003   Family History  Problem Relation Age of Onset  . Diabetes Mother   . Aneurysm Mother   . Stroke Mother   . Hypertension Mother   . Alcohol abuse Father   . Cancer Father        Lung  . Cancer Sister        Lung Cancer  .  Post-traumatic stress disorder Brother   . Heart attack Neg Hx    Social History   Occupational History  . Not on file.   Social History Main Topics  . Smoking status: Current Every Day Smoker    Packs/day: 1.00    Years: 52.00    Types: Cigarettes    Start date: 04/09/1964  . Smokeless tobacco: Never Used     Comment: down from 2.5 ppd  . Alcohol use Yes     Comment: drinking 6 beers per week  . Drug use: No  . Sexual activity: Not Currently    Birth control/ protection: Condom   Tobacco Counseling Ready to quit: No Counseling given: Yes Patient declines resources such as 1-800-QUIT-NOW  Activities of Daily Living In your present state of health, do you have any difficulty performing the following activities: 12/01/2016 05/17/2016  Hearing? N N  Vision? N Y  Difficulty concentrating or making decisions? N N  Walking or climbing stairs? Y Y  Dressing or bathing? N N  Doing errands, shopping? N N  Preparing Food and eating ? N -  Using the Toilet? N -  In the past six months, have you accidently leaked urine? N -  Do you have problems with loss of bowel control? N -  Managing your Medications? N -  Managing your Finances? N -  Housekeeping or managing your Housekeeping? N -  Some recent data might be hidden   Home Safety:  My home has a working smoke alarm:  Yes X 2           My home throw rugs have been fastened down to the floor or removed:  Non-slip backs I have non-slip mats in the bathtub and shower:  Non-slip surface         All my home's stairs have railings or bannisters: One level home with 17 outside steps with handrails           My home's floors, stairs and hallways are free from clutter, wires and cords:  Yes     I wear seatbelts consistently:  Yes    Immunizations and Health Maintenance Immunization History  Administered Date(s) Administered  . Influenza Split 06/24/2011  . Influenza,inj,Quad PF,36+ Mos 04/11/2013  . Pneumococcal Polysaccharide-23  12/21/2012   Health Maintenance Due  Topic Date Due  . TETANUS/TDAP  10/27/1970  . COLONOSCOPY  10/26/2001  . FOOT EXAM  02/13/2016  . URINE MICROALBUMIN  02/13/2016  . HEMOGLOBIN A1C  08/13/2016  . PNA vac Low Risk Adult (1 of 2 - PCV13) 10/26/2016  Patient will consider receiving TDaP at local pharmacy Contact info given for patient to schedule colonoscopy Urine microalbumin done today Hgb A1C done today = 6.0 same  as 02/11/2016 Patient declined prevnar today. Literature given regarding prevnar and pneumovax Foot exam done today  Diabetic Foot Exam - Simple   Simple Foot Form Diabetic Foot exam was performed with the following findings:  Yes 12/01/2016 10:30 AM  Visual Inspection No deformities, no ulcerations, no other skin breakdown bilaterally:  Yes Sensation Testing See comments:  Yes Pulse Check Posterior Tibialis and Dorsalis pulse intact bilaterally:  Yes Comments Left foot: Able to sense monofilament at Great toe only Right foot: Intact to sensation     Patient Care Team: Vivi Barrack, MD as PCP - General (Family Medicine) Nahser, Wonda Cheng, MD as Consulting Physician (Cardiology)  Indicate any recent Medical Services you may have received from other than [facility] providers in the past year (date may be approximate).    Assessment:   This is a routine wellness examination for [place].   Hearing/Vision screen  Hearing Screening   Method: Audiometry   '125Hz'  '250Hz'  '500Hz'  '1000Hz'  '2000Hz'  '3000Hz'  '4000Hz'  '6000Hz'  '8000Hz'   Right ear:   Fail Fail 40  Fail    Left ear:   40 40 40  Fail      Dietary issues and exercise activities discussed: Current Exercise Habits: Home exercise routine, Type of exercise: walking (Splitts wood 3 hours/day 4 days/week), Time (Minutes): 30, Frequency (Times/Week): 3, Weekly Exercise (Minutes/Week): 90, Intensity: Moderate  Goals    . Blood Pressure < 140/90    . Quit drinking alcohol  (pt-stated)      Depression Screen PHQ 2/9 Scores  12/01/2016 05/30/2016 02/11/2016 06/23/2015  PHQ - 2 Score 1 0 0 0  PHQ- 9 Score - - - -    Fall Risk Fall Risk  12/01/2016 05/30/2016 04/01/2015 09/24/2014 08/22/2014  Falls in the past year? No Yes No No No  Number falls in past yr: - 1 - - -   TUG Test:  Done in 9 seconds. Patient used both hands to push out of chair and to sit back down.  Cognitive Function: Mini-Cog  Passed with score 5/5   Screening Tests Health Maintenance  Topic Date Due  . TETANUS/TDAP  10/27/1970  . COLONOSCOPY  10/26/2001  . FOOT EXAM  02/13/2016  . URINE MICROALBUMIN  02/13/2016  . HEMOGLOBIN A1C  08/13/2016  . PNA vac Low Risk Adult (1 of 2 - PCV13) 10/26/2016  . INFLUENZA VACCINE  02/22/2017  . OPHTHALMOLOGY EXAM  08/30/2017  . Hepatitis C Screening  Completed  . HIV Screening  Completed        Plan:    Patient will consider receiving TDaP at local pharmacy Contact info given for patient to schedule colonoscopy Foot exam done today Urine microalbumin done today Hgb A1C done today = 6.0 same as 02/11/2016 Patient declined prevnar today. Literature given regarding Prevnar and Pneumovax  I have personally reviewed and noted the following in the patient's chart:   . Medical and social history . Use of alcohol, tobacco or illicit drugs  . Current medications and supplements . Functional ability and status . Nutritional status . Physical activity . Advanced directives . List of other physicians . Hospitalizations, surgeries, and ER visits in previous 12 months . Vitals . Screenings to include cognitive, depression, and falls . Referrals and appointments  In addition, I have reviewed and discussed with patient certain preventive protocols, quality metrics, and best practice recommendations. A written personalized care plan for preventive services as well as general preventive health recommendations were provided to patient.  Velora Heckler, RN   12/01/2016

## 2016-12-01 NOTE — Patient Instructions (Signed)
 Diabetes and Foot Care Diabetes may cause you to have problems because of poor blood supply (circulation) to your feet and legs. This may cause the skin on your feet to become thinner, break easier, and heal more slowly. Your skin may become dry, and the skin may peel and crack. You may also have nerve damage in your legs and feet causing decreased feeling in them. You may not notice minor injuries to your feet that could lead to infections or more serious problems. Taking care of your feet is one of the most important things you can do for yourself. Follow these instructions at home:  Wear shoes at all times, even in the house. Do not go barefoot. Bare feet are easily injured.  Check your feet daily for blisters, cuts, and redness. If you cannot see the bottom of your feet, use a mirror or ask someone for help.  Wash your feet with warm water (do not use hot water) and mild soap. Then pat your feet and the areas between your toes until they are completely dry. Do not soak your feet as this can dry your skin.  Apply a moisturizing lotion or petroleum jelly (that does not contain alcohol and is unscented) to the skin on your feet and to dry, brittle toenails. Do not apply lotion between your toes.  Trim your toenails straight across. Do not dig under them or around the cuticle. File the edges of your nails with an emery board or nail file.  Do not cut corns or calluses or try to remove them with medicine.  Wear clean socks or stockings every day. Make sure they are not too tight. Do not wear knee-high stockings since they may decrease blood flow to your legs.  Wear shoes that fit properly and have enough cushioning. To break in new shoes, wear them for just a few hours a day. This prevents you from injuring your feet. Always look in your shoes before you put them on to be sure there are no objects inside.  Do not cross your legs. This may decrease the blood flow to your feet.  If you find a  minor scrape, cut, or break in the skin on your feet, keep it and the skin around it clean and dry. These areas may be cleansed with mild soap and water. Do not cleanse the area with peroxide, alcohol, or iodine.  When you remove an adhesive bandage, be sure not to damage the skin around it.  If you have a wound, look at it several times a day to make sure it is healing.  Do not use heating pads or hot water bottles. They may burn your skin. If you have lost feeling in your feet or legs, you may not know it is happening until it is too late.  Make sure your health care provider performs a complete foot exam at least annually or more often if you have foot problems. Report any cuts, sores, or bruises to your health care provider immediately. Contact a health care provider if:  You have an injury that is not healing.  You have cuts or breaks in the skin.  You have an ingrown nail.  You notice redness on your legs or feet.  You feel burning or tingling in your legs or feet.  You have pain or cramps in your legs and feet.  Your legs or feet are numb.  Your feet always feel cold. Get help right away if:  There is   increasing redness, swelling, or pain in or around a wound.  There is a red line that goes up your leg.  Pus is coming from a wound.  You develop a fever or as directed by your health care provider.  You notice a bad smell coming from an ulcer or wound. This information is not intended to replace advice given to you by your health care provider. Make sure you discuss any questions you have with your health care provider. Document Released: 07/08/2000 Document Revised: 12/17/2015 Document Reviewed: 12/18/2012 Elsevier Interactive Patient Education  2017 [employer]   Fall Prevention in the Home Falls can cause injuries. They can happen to people of all ages. There are many things you can do to make your home safe and to help prevent falls. What can I do on the  outside of my home?  Regularly fix the edges of walkways and driveways and fix any cracks.  Remove anything that might make you trip as you walk through a door, such as a raised step or threshold.  Trim any bushes or trees on the path to your home.  Use bright outdoor lighting.  Clear any walking paths of anything that might make someone trip, such as rocks or tools.  Regularly check to see if handrails are loose or broken. Make sure that both sides of any steps have handrails.  Any raised decks and porches should have guardrails on the edges.  Have any leaves, snow, or ice cleared regularly.  Use sand or salt on walking paths during winter.  Clean up any spills in your garage right away. This includes oil or grease spills. What can I do in the bathroom?  Use night lights.  Install grab bars by the toilet and in the tub and shower. Do not use towel bars as grab bars.  Use non-skid mats or decals in the tub or shower.  If you need to sit down in the shower, use a plastic, non-slip stool.  Keep the floor dry. Clean up any water that spills on the floor as soon as it happens.  Remove soap buildup in the tub or shower regularly.  Attach bath mats securely with double-sided non-slip rug tape.  Do not have throw rugs and other things on the floor that can make you trip. What can I do in the bedroom?  Use night lights.  Make sure that you have a light by your bed that is easy to reach.  Do not use any sheets or blankets that are too big for your bed. They should not hang down onto the floor.  Have a firm chair that has side arms. You can use this for support while you get dressed.  Do not have throw rugs and other things on the floor that can make you trip. What can I do in the kitchen?  Clean up any spills right away.  Avoid walking on wet floors.  Keep items that you use a lot in easy-to-reach places.  If you need to reach something above you, use a strong step  stool that has a grab bar.  Keep electrical cords out of the way.  Do not use floor polish or wax that makes floors slippery. If you must use wax, use non-skid floor wax.  Do not have throw rugs and other things on the floor that can make you trip. What can I do with my stairs?  Do not leave any items on the stairs.  Make sure that there   are handrails on both sides of the stairs and use them. Fix handrails that are broken or loose. Make sure that handrails are as long as the stairways.  Check any carpeting to make sure that it is firmly attached to the stairs. Fix any carpet that is loose or worn.  Avoid having throw rugs at the top or bottom of the stairs. If you do have throw rugs, attach them to the floor with carpet tape.  Make sure that you have a light switch at the top of the stairs and the bottom of the stairs. If you do not have them, ask someone to add them for you. What else can I do to help prevent falls?  Wear shoes that:  Do not have high heels.  Have rubber bottoms.  Are comfortable and fit you well.  Are closed at the toe. Do not wear sandals.  If you use a stepladder:  Make sure that it is fully opened. Do not climb a closed stepladder.  Make sure that both sides of the stepladder are locked into place.  Ask someone to hold it for you, if possible.  Clearly mark and make sure that you can see:  Any grab bars or handrails.  First and last steps.  Where the edge of each step is.  Use tools that help you move around (mobility aids) if they are needed. These include:  Canes.  Walkers.  Scooters.  Crutches.  Turn on the lights when you go into a dark area. Replace any light bulbs as soon as they burn out.  Set up your furniture so you have a clear path. Avoid moving your furniture around.  If any of your floors are uneven, fix them.  If there are any pets around you, be aware of where they are.  Review your medicines with your doctor. Some  medicines can make you feel dizzy. This can increase your chance of falling. Ask your doctor what other things that you can do to help prevent falls. This information is not intended to replace advice given to you by your health care provider. Make sure you discuss any questions you have with your health care provider. Document Released: 05/07/2009 Document Revised: 12/17/2015 Document Reviewed: 08/15/2014 Elsevier Interactive Patient Education  2017 [employer]   Health Maintenance, Male A healthy lifestyle and preventive care is important for your health and wellness. Ask your health care provider about what schedule of regular examinations is right for you. What should I know about weight and diet?  Eat a Healthy Diet  Eat plenty of vegetables, fruits, whole grains, low-fat dairy products, and lean protein.  Do not eat a lot of foods high in solid fats, added sugars, or salt. Maintain a Healthy Weight  Regular exercise can help you achieve or maintain a healthy weight. You should:  Do at least 150 minutes of exercise each week. The exercise should increase your heart rate and make you sweat (moderate-intensity exercise).  Do strength-training exercises at least twice a week. Watch Your Levels of Cholesterol and Blood Lipids  Have your blood tested for lipids and cholesterol every 5 years starting at 65 years of age. If you are at high risk for heart disease, you should start having your blood tested when you are 65 years old. You may need to have your cholesterol levels checked more often if:  Your lipid or cholesterol levels are high.  You are older than 65 years of age.  You are at high   risk for heart disease. What should I know about cancer screening? Many types of cancers can be detected early and may often be prevented. Lung Cancer  You should be screened every year for lung cancer if:  You are a current smoker who has smoked for at least 30 years.  You are a former  smoker who has quit within the past 15 years.  Talk to your health care provider about your screening options, when you should start screening, and how often you should be screened. Colorectal Cancer  Routine colorectal cancer screening usually begins at 65 years of age and should be repeated every 5-10 years until you are 65 years old. You may need to be screened more often if early forms of precancerous polyps or small growths are found. Your health care provider may recommend screening at an earlier age if you have risk factors for colon cancer.  Your health care provider may recommend using home test kits to check for hidden blood in the stool.  A small camera at the end of a tube can be used to examine your colon (sigmoidoscopy or colonoscopy). This checks for the earliest forms of colorectal cancer. Prostate and Testicular Cancer  Depending on your age and overall health, your health care provider may do certain tests to screen for prostate and testicular cancer.  Talk to your health care provider about any symptoms or concerns you have about testicular or prostate cancer. Skin Cancer  Check your skin from head to toe regularly.  Tell your health care provider about any new moles or changes in moles, especially if:  There is a change in a mole's size, shape, or color.  You have a mole that is larger than a pencil eraser.  Always use sunscreen. Apply sunscreen liberally and repeat throughout the day.  Protect yourself by wearing long sleeves, pants, a wide-brimmed hat, and sunglasses when outside. What should I know about heart disease, diabetes, and high blood pressure?  If you are 18-39 years of age, have your blood pressure checked every 3-5 years. If you are 40 years of age or older, have your blood pressure checked every year. You should have your blood pressure measured twice-once when you are at a hospital or clinic, and once when you are not at a hospital or clinic. Record  the average of the two measurements. To check your blood pressure when you are not at a hospital or clinic, you can use:  An automated blood pressure machine at a pharmacy.  A home blood pressure monitor.  Talk to your health care provider about your target blood pressure.  If you are between 45-79 years old, ask your health care provider if you should take aspirin to prevent heart disease.  Have regular diabetes screenings by checking your fasting blood sugar level.  If you are at a normal weight and have a low risk for diabetes, have this test once every three years after the age of 45.  If you are overweight and have a high risk for diabetes, consider being tested at a younger age or more often.  A one-time screening for abdominal aortic aneurysm (AAA) by ultrasound is recommended for men aged 65-75 years who are current or former smokers. What should I know about preventing infection? Hepatitis B  If you have a higher risk for hepatitis B, you should be screened for this virus. Talk with your health care provider to find out if you are at risk for hepatitis B infection.   Hepatitis C  Blood testing is recommended for:  Everyone born from 1945 through 1965.  Anyone with known risk factors for hepatitis C. Sexually Transmitted Diseases (STDs)  You should be screened each year for STDs including gonorrhea and chlamydia if:  You are sexually active and are younger than 65 years of age.  You are older than 65 years of age and your health care provider tells you that you are at risk for this type of infection.  Your sexual activity has changed since you were last screened and you are at an increased risk for chlamydia or gonorrhea. Ask your health care provider if you are at risk.  Talk with your health care provider about whether you are at high risk of being infected with HIV. Your health care provider may recommend a prescription medicine to help prevent HIV infection. What else  can I do?  Schedule regular health, dental, and eye exams.  Stay current with your vaccines (immunizations).  Do not use any tobacco products, such as cigarettes, chewing tobacco, and e-cigarettes. If you need help quitting, ask your health care provider.  Limit alcohol intake to no more than 2 drinks per day. One drink equals 12 ounces of beer, 5 ounces of wine, or 1 ounces of hard liquor.  Do not use street drugs.  Do not share needles.  Ask your health care provider for help if you need support or information about quitting drugs.  Tell your health care provider if you often feel depressed.  Tell your health care provider if you have ever been abused or do not feel safe at home. This information is not intended to replace advice given to you by your health care provider. Make sure you discuss any questions you have with your health care provider. Document Released: 01/07/2008 Document Revised: 03/09/2016 Document Reviewed: 04/14/2015 Elsevier Interactive Patient Education  2017 [employer]   Steps to Quit Smoking Smoking tobacco can be bad for your health. It can also affect almost every organ in your body. Smoking puts you and people around you at risk for many serious long-lasting (chronic) diseases. Quitting smoking is hard, but it is one of the best things that you can do for your health. It is never too late to quit. What are the benefits of quitting smoking? When you quit smoking, you lower your risk for getting serious diseases and conditions. They can include:  Lung cancer or lung disease.  Heart disease.  Stroke.  Heart attack.  Not being able to have children (infertility).  Weak bones (osteoporosis) and broken bones (fractures). If you have coughing, wheezing, and shortness of breath, those symptoms may get better when you quit. You may also get sick less often. If you are pregnant, quitting smoking can help to lower your chances of having a baby of low birth  weight. What can I do to help me quit smoking? Talk with your doctor about what can help you quit smoking. Some things you can do (strategies) include:  Quitting smoking totally, instead of slowly cutting back how much you smoke over a period of time.  Going to in-person counseling. You are more likely to quit if you go to many counseling sessions.  Using resources and support systems, such as:  Online chats with a counselor.  Phone quitlines.  Printed self-help materials.  Support groups or group counseling.  Text messaging programs.  Mobile phone apps or applications.  Taking medicines. Some of these medicines may have nicotine in them.   If you are pregnant or breastfeeding, do not take any medicines to quit smoking unless your doctor says it is okay. Talk with your doctor about counseling or other things that can help you. Talk with your doctor about using more than one strategy at the same time, such as taking medicines while you are also going to in-person counseling. This can help make quitting easier. What things can I do to make it easier to quit? Quitting smoking might feel very hard at first, but there is a lot that you can do to make it easier. Take these steps:  Talk to your family and friends. Ask them to support and encourage you.  Call phone quitlines, reach out to support groups, or work with a counselor.  Ask people who smoke to not smoke around you.  Avoid places that make you want (trigger) to smoke, such as:  Bars.  Parties.  Smoke-break areas at work.  Spend time with people who do not smoke.  Lower the stress in your life. Stress can make you want to smoke. Try these things to help your stress:  Getting regular exercise.  Deep-breathing exercises.  Yoga.  Meditating.  Doing a body scan. To do this, close your eyes, focus on one area of your body at a time from head to toe, and notice which parts of your body are tense. Try to relax the muscles  in those areas.  Download or buy apps on your mobile phone or tablet that can help you stick to your quit plan. There are many free apps, such as QuitGuide from the CDC (Centers for Disease Control and Prevention). You can find more support from smokefree.gov and other websites. This information is not intended to replace advice given to you by your health care provider. Make sure you discuss any questions you have with your health care provider. Document Released: 05/07/2009 Document Revised: 03/08/2016 Document Reviewed: 11/25/2014 Elsevier Interactive Patient Education  2017 [employer]  

## 2016-12-27 ENCOUNTER — Encounter: Payer: Self-pay | Admitting: *Deleted

## 2016-12-27 NOTE — Progress Notes (Signed)
Addendum: I have reviewed this visit and discussed with Howell Rucks, RN, BSN, and agree with her documentation.   Algis Greenhouse. Jerline Pain, [place] Resident PGY-3 12/27/2016 11:02 AM

## 2017-01-13 ENCOUNTER — Encounter: Payer: Self-pay | Admitting: Physician Assistant

## 2017-01-26 NOTE — Progress Notes (Signed)
Cardiology Office Note    Date:  01/31/2017   ID:  Timothy Martinez, DOB 1951/07/03, MRN [id number]  PCP:  Eloise Levels, MD  Cardiologist:  Dr. Acie Fredrickson  Chief Complaint: yearly follow up for PAF and dCHF  History of Present Illness:   Timothy Martinez is a 66 y.o. male with hx of HTN, HLD, DM, PAF, chronic diastolic CHF, COPD, ongoing tobacco abuse and GERD presents for yearly follow up.  She was maintaining sinus rhythm when last seen by Dr. Acie Fredrickson 01/28/16.   Here today for follow-up. 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 He has cut back on cigarette smoking significantly from 3 packs a day to one pack a day. His goal to cut back this than half a pack a day. The patient denies nausea, vomiting, fever, chest pain, palpitations, shortness of breath, orthopnea, PND, dizziness, syncope, cough, congestion, abdominal pain, hematochezia, melena, lower extremity edema.   Past Medical History:  Diagnosis Date  . Alcohol abuse   . Alcoholic pancreatitis   . Anginal pain ([place])   . Arthritis    "eat up w/it" (05/17/2016)  . Atrial fibrillation status post cardioversion ([place]) 08/2014  . CHF (congestive heart failure) ([place])   . Chronic back pain   . Chronic bronchitis ([place])   . COPD (chronic obstructive pulmonary disease) ([place])   . Daily headache    "need eye examined" (05/17/2016)  . GERD (gastroesophageal reflux disease)   . History of blood transfusion 2003   related to "spleen OR"  . Hyperlipidemia   . Hypertension   . Pancreatic pseudocyst    seen on CT scan 07/2014  . Pneumonia    "?a few times" (05/17/2016)  . Shortness of breath    with ambulation  . Tobacco use disorder, continuous   . Type II diabetes mellitus ([place])     Past Surgical History:  Procedure Laterality Date  . BACK SURGERY    . CARDIOVERSION N/A 09/22/2014   Procedure: CARDIOVERSION;  Surgeon: Thayer Headings, MD;  Location[facility] ENDOSCOPY;  Service: Cardiovascular;  Laterality: N/A;   . NECK SURGERY  1974   not cervical, states had lesion on neck which was removed  . POSTERIOR LAMINECTOMY / DECOMPRESSION LUMBAR SPINE  10/2006   of L4, L5 and S1 with a 5-1 diskectomy, microdissection with the microscope/notes 10/24/2006  . SPLENECTOMY  2003    Current Medications: Prior to Admission medications   Medication Sig Start Date End Date Taking? Authorizing Provider  albuterol (PROVENTIL HFA;VENTOLIN HFA) 108 (90 Base) MCG/ACT inhaler Inhale 1-2 puffs into the lungs every 12 (twelve) hours as needed for wheezing or shortness of breath. 11/30/15   Leone Brand, MD  atorvastatin (LIPITOR) 40 MG tablet Take 1 tablet (40 mg total) by mouth daily at 6 PM. 01/28/16   Nahser, Wonda Cheng, MD  Blood Glucose Monitoring Suppl (ONE TOUCH ULTRA 2) W/DEVICE KIT Dispense one glucometer. Diagnosis Type 2 Diabetes. E11.9 Patient not taking: Reported on 12/01/2016 09/12/14   Willeen Niece, MD  cyanocobalamin 500 MCG tablet Take 500 mcg by mouth daily.    [provider]  famotidine (PEPCID) 40 MG tablet TAKE 1 TABLET (40 MG TOTAL) BY MOUTH DAILY. Patient taking differently: Take 40 mg by mouth every other day 03/03/16   Janora Norlander, DO  glucose blood (ONE TOUCH ULTRA TEST) test strip Use check fasting blood glucose qAM. Type 2 Diabetes E11.9 Patient not taking: Reported on 12/01/2016  09/12/14   Willeen Niece, MD  metFORMIN (GLUCOPHAGE) 500 MG tablet TAKE 2 TABLETS BY MOUTH 2 TIMES DAILY WITH A MEAL. Patient taking differently: Take 1,000 mg by mouth two times a day 03/03/16   Ronnie Doss M, DO  metoprolol (LOPRESSOR) 50 MG tablet Take 1.5 tablets (75 mg total) by mouth 2 (two) times daily. 01/28/16   Nahser, Wonda Cheng, MD  ONE TOUCH LANCETS MISC Check fasting blood glucose qAM. Type 2 DM. E11.9 Patient not taking: Reported on 12/01/2016 09/12/14   Willeen Niece, MD  XARELTO 20 MG TABS tablet TAKE 1 TABLET (20 MG TOTAL) BY MOUTH DAILY WITH SUPPER. 09/06/16   Nahser, Wonda Cheng, MD     Allergies:   Tramadol; Prozac [fluoxetine hcl]; Morphine and related; and Penicillins   Social History   Social History  . Marital status: Divorced    Spouse name: N/A  . Number of children: N/A  . Years of education: N/A   Social History Main Topics  . Smoking status: Current Every Day Smoker    Packs/day: 1.00    Years: 52.00    Types: Cigarettes    Start date: 04/09/1964  . Smokeless tobacco: Never Used     Comment: down from 2.5 ppd  . Alcohol use Yes     Comment: drinking 6 beers per week  . Drug use: No  . Sexual activity: Not Currently    Birth control/ protection: Condom   Other Topics Concern  . 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     Admits to alcohol abuse in past- no longer drinking- last drink 11/2.      Family History:  The patient's family history includes Alcohol abuse in his father; Aneurysm in his mother; Cancer in his father and sister; Diabetes in his mother; Hypertension in his mother; Post-traumatic stress disorder in his brother; Stroke in his mother.   ROS:   Please see the history of present illness.    ROS All other systems reviewed and are negative.   PHYSICAL EXAM:   VS:  BP 120/60   Pulse 66   Ht '6\' 3"'  (1.905 m)   Wt 174 lb 12.8 oz (79.3 kg)   SpO2 95%   BMI 21.85 kg/m    GEN: Well nourished, well developed, in no acute distress  HEENT: normal  Neck: no JVD, carotid bruits, or masses Cardiac:RRR; no murmurs, rubs, or gallops,no edema  Respiratory:  clear to auscultation bilaterally, normal work of breathing GI: soft, nontender, nondistended, + BS MS: no deformity or atrophy  Skin: warm and dry, no rash Neuro:  Alert and Oriented x 3, Strength and sensation are intact Psych: euthymic mood, full affect  Wt Readings from Last 3 Encounters:  01/31/17 174 lb 12.8 oz (79.3 kg)  12/01/16 178 lb 6.4 oz (80.9 kg)  05/30/16 178 lb (80.7 kg)       Studies/Labs Reviewed:   EKG:  EKG is ordered today.  The ekg ordered today demonstrates Normal sinus rhythm at rate of 67 bpm  Recent Labs: 05/18/2016: ALT 10; BUN 6; Creatinine, Ser 0.72; Hemoglobin 14.1; Platelets 228; Potassium 4.7; Sodium 140   Lipid Panel    Component Value Date/Time   CHOL 89 (L) 02/11/2016 1517   TRIG 59 02/11/2016 1517   HDL 53 02/11/2016 1517   CHOLHDL 1.7 02/11/2016 1517   VLDL 12 02/11/2016 1517  [place] 24 02/11/2016 1517    Additional studies/ records that were reviewed today include:   Echocardiogram: 12/2014 Study Conclusions  - Left ventricle: The cavity size was normal. Wall thickness was   increased in a pattern of moderate LVH. Systolic function was   normal. The estimated ejection fraction was in the range of 55%   to 60%. Wall motion was normal; there were no regional wall   motion abnormalities. Doppler parameters are consistent with   abnormal left ventricular relaxation (grade 1 diastolic   dysfunction). The E/e&' ratio is between 8-15, suggesting   indeterminate LV filling pressure. - Mitral valve: Thickned leaflets with trivial MR. 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 - Left atrium: The atrium was normal in size.  Impressions:  - Compared to the prior study in 07/2014, the EF has normalized. The   remnant chordal loop structure is still present in the LV cavity.    ASSESSMENT & PLAN:    1. PAF - Maintaining sinus rhythm. No palpitation or melena. Continue Xarelto, and metoprolol.  2. Chronic diastolic CHF - Euvolemic.  3. HTN - Stable and well controlled on current regimen  4. HLD - Managed by PCP. Continue statin.  5. DM - per primary   6. Ongoing tobacco abuse - Advised cessation. He has cut back significantly.    Medication Adjustments/Labs and Tests Ordered: Current medicines are reviewed at length with the patient  today.  Concerns regarding medicines are outlined above.  Medication changes, Labs and Tests ordered today are listed in the Patient Instructions below. Patient Instructions  Medication Instructions:  Your physician recommends that you continue on your current medications as directed. Please refer to the Current Medication list given to you today.  Labwork: NONE  Testing/Procedures: NONE  Follow-Up: Your physician wants you to follow-up in: 12 months with Dr. Acie Fredrickson. You will receive a reminder letter in the mail two months in advance. If you don't receive a letter, please call our office to schedule the follow-up appointment.   If you need a refill on your cardiac medications before your next appointment, please call your pharmacy.       Jarrett Soho, PA  01/31/2017 11:05 AM    [facility] [address], [place], [place]  [address] Phone: [phone]; Fax: [phone]

## 2017-01-31 ENCOUNTER — Encounter: Payer: Self-pay | Admitting: Physician Assistant

## 2017-01-31 ENCOUNTER — Encounter (INDEPENDENT_AMBULATORY_CARE_PROVIDER_SITE_OTHER): Payer: Self-pay

## 2017-01-31 ENCOUNTER — Ambulatory Visit (INDEPENDENT_AMBULATORY_CARE_PROVIDER_SITE_OTHER): Payer: Medicare Other | Admitting: Physician Assistant

## 2017-01-31 VITALS — BP 120/60 | HR 66 | Ht 75.0 in | Wt 174.8 lb

## 2017-01-31 DIAGNOSIS — I482 Chronic atrial fibrillation, unspecified: Secondary | ICD-10-CM

## 2017-01-31 DIAGNOSIS — I480 Paroxysmal atrial fibrillation: Secondary | ICD-10-CM | POA: Diagnosis not present

## 2017-01-31 DIAGNOSIS — I5032 Chronic diastolic (congestive) heart failure: Secondary | ICD-10-CM

## 2017-01-31 DIAGNOSIS — E784 Other hyperlipidemia: Secondary | ICD-10-CM

## 2017-01-31 DIAGNOSIS — Z720 Tobacco use: Secondary | ICD-10-CM

## 2017-01-31 DIAGNOSIS — E7849 Other hyperlipidemia: Secondary | ICD-10-CM

## 2017-01-31 DIAGNOSIS — I10 Essential (primary) hypertension: Secondary | ICD-10-CM

## 2017-01-31 DIAGNOSIS — E119 Type 2 diabetes mellitus without complications: Secondary | ICD-10-CM

## 2017-01-31 NOTE — Patient Instructions (Signed)
Medication Instructions:  Your physician recommends that you continue on your current medications as directed. Please refer to the Current Medication list given to you today.  Labwork: NONE  Testing/Procedures: NONE  Follow-Up: Your physician wants you to follow-up in: 12 months with Dr. Acie Fredrickson. You will receive a reminder letter in the mail two months in advance. If you don't receive a letter, please call our office to schedule the follow-up appointment.   If you need a refill on your cardiac medications before your next appointment, please call your pharmacy.

## 2017-03-03 ENCOUNTER — Other Ambulatory Visit: Payer: Self-pay | Admitting: Family Medicine

## 2017-03-05 ENCOUNTER — Other Ambulatory Visit: Payer: Self-pay | Admitting: Cardiovascular Disease

## 2017-03-06 NOTE — Telephone Encounter (Signed)
Pt last saw B. Bhagat, PA on 01/31/17, last labs 05/18/16 Creat 0.72, age 66, weight 79.3kg, CrCl 114.73, based on CrCl pt is on appropriate dosage of Xarelto 20mg  QD.  Will refill rx.

## 2017-03-13 ENCOUNTER — Other Ambulatory Visit: Payer: Self-pay | Admitting: *Deleted

## 2017-03-13 MED ORDER — ALBUTEROL SULFATE HFA 108 (90 BASE) MCG/ACT IN AERS: 1.0000 | INHALATION_SPRAY | Freq: Two times a day (BID) | RESPIRATORY_TRACT | 3 refills | Status: DC | PRN

## 2017-03-16 ENCOUNTER — Other Ambulatory Visit: Payer: Self-pay | Admitting: Cardiovascular Disease

## 2017-03-17 ENCOUNTER — Ambulatory Visit (INDEPENDENT_AMBULATORY_CARE_PROVIDER_SITE_OTHER): Payer: Medicare Other | Admitting: Family Medicine

## 2017-03-17 ENCOUNTER — Encounter: Payer: Self-pay | Admitting: Family Medicine

## 2017-03-17 VITALS — BP 122/64 | HR 60 | Temp 97.7°F | Ht 75.0 in | Wt 174.6 lb

## 2017-03-17 DIAGNOSIS — M7711 Lateral epicondylitis, right elbow: Secondary | ICD-10-CM | POA: Insufficient documentation

## 2017-03-17 DIAGNOSIS — E119 Type 2 diabetes mellitus without complications: Secondary | ICD-10-CM | POA: Diagnosis not present

## 2017-03-17 HISTORY — DX: Lateral epicondylitis, right elbow: M77.11

## 2017-03-17 LAB — POCT GLYCOSYLATED HEMOGLOBIN (HGB A1C): Hemoglobin A1C: 5.9

## 2017-03-17 MED ORDER — ATORVASTATIN CALCIUM 40 MG PO TABS: 40.0000 mg | ORAL_TABLET | Freq: Every day | ORAL | 3 refills | Status: DC

## 2017-03-17 MED ORDER — METFORMIN HCL 1000 MG PO TABS: 1000.0000 mg | ORAL_TABLET | Freq: Two times a day (BID) | ORAL | 3 refills | Status: DC

## 2017-03-17 MED ORDER — METOPROLOL TARTRATE 50 MG PO TABS: 75.0000 mg | ORAL_TABLET | Freq: Two times a day (BID) | ORAL | 3 refills | Status: DC

## 2017-03-17 MED ORDER — TRAMADOL HCL 50 MG PO TABS: 50.0000 mg | ORAL_TABLET | Freq: Four times a day (QID) | ORAL | 0 refills | Status: DC | PRN

## 2017-03-17 NOTE — Assessment & Plan Note (Signed)
Well-controlled on metformin 500 mg 4 times daily.  Switched dosing to 1000 mg twice a day for patient convenience.  - Continue current regimen and follow-up in 6 months for repeat A1c

## 2017-03-17 NOTE — Progress Notes (Signed)
    Subjective:  Timothy Martinez is a 66 y.o. male who presents to the [facility] today for diabetes follow-up and medication refill  HPI:  DIABETES Type II Medications: Reports taking and tolerating without side effects. Blood Sugars per patient: Does not check sugars Diet-well-developed Regular Exercise-walks 3 times a week  Health Maintenance Due  Topic Date Due  . TETANUS/TDAP  10/27/1970  . COLONOSCOPY  10/26/2001  . PNA vac Low Risk Adult (1 of 2 - PCV13) 10/26/2016  . INFLUENZA VACCINE  02/22/2017   On Aspirin-on xarelto On statin-yes Daily foot monitoring-yes Last eye exam: Yes Last foot exam: Up-to-date Nephropathy screen: Up-to-date  ROS- Denies Polyuria,Polydipsia, nocturia, Vision changes, feet or hand numbness/pain/tingling. Denies  Hypoglycemia symptoms (shaky, sweaty, hungry, weak anxious, tremor, palpitations, confusion, behavior change).   Hemoglobin a1c:  Lab Results  Component Value Date   HGBA1C 5.9 03/17/2017   HGBA1C 6.0 12/01/2016   HGBA1C 6.0 02/11/2016   Right elbow pain: Pain in his right elbow with palpation over the past month. 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 Denies any numbness or tingling in extremities, erythema, warmth, swelling or drainage. Has not had anything like this in the past. Denies any trauma.  Tobacco use: Smokes 1 pack per day Medication: reviewed and updated ROS: see HPI   Objective:  Physical Exam: BP 122/64   Pulse 60   Temp 97.7 F (36.5 C) (Oral)   Ht 6\' 3"  (1.905 m)   Wt 174 lb 9.6 oz (79.2 kg)   SpO2 100%   BMI 21.82 kg/m   Gen: 66 year old male in NAD, resting comfortably CV: RRR with no murmurs appreciated Pulm: NWOB, CTAB with no crackles, wheezes, or rhonchi GI: Normal bowel sounds present. Soft, Nontender, Nondistended. MSK: no edema, cyanosis, or clubbing noted, tenderness to palpation at lateral epicondyle on right  elbow with full range of motion Skin: warm, dry Neuro: grossly normal, moves all extremities Psych: Normal affect and thought content  Results for orders placed or performed in visit on 03/17/17 (from the past 72 hour(s))  HgB A1c     Status: None   Collection Time: 03/17/17  9:20 AM  Result Value Ref Range   Hemoglobin A1C 5.9      Assessment/Plan:  Lateral epicondylitis of right elbow Pain and tenderness and lateral epicondyle of right elbow for past month most likely lateral epicondylitis. Uses repetitive supination and abduction at right elbow. Recommended ICE, tylenol and prescribed short course of tramadol PRN.  Also recommended counter force elbow brace.  - follow up in 6 weeks - may need physical therapy - recommended using other hand to throw or or throwing with both hands, if he cannot avoid this work  Diabetes mellitus ([place]) Well-controlled on metformin 500 mg 4 times daily.  Switched dosing to 1000 mg twice a day for patient convenience.  - Continue current regimen and follow-up in 6 months for repeat A1c  Health maintenance Patient declines being adjusted and tetanus shot and colonoscopy as well as Prevnar. States that he has been doing the fecal cards, but never received any information back about them.  - Recommended follow-up in September for flu shot

## 2017-03-17 NOTE — Assessment & Plan Note (Signed)
Pain and tenderness and lateral epicondyle of right elbow for past month most likely lateral epicondylitis. Uses repetitive supination and abduction at right elbow. Recommended ICE, tylenol and prescribed short course of tramadol PRN.  Also recommended counter force elbow brace.  - follow up in 6 weeks - may need physical therapy - recommended using other hand to throw or or throwing with both hands, if he cannot avoid this work

## 2017-03-17 NOTE — Patient Instructions (Addendum)
Dent, you were seen today for diabetes follow up, medication refills and for right elbow pain.  Your hemoglobin A1C today is 5.9.  Well done!  Continue with your diet, exercise and please try to stop smoking.    For your elbow pain I have provided you with a handout to try some exercises and recommendations for pain.  You can try tylenol as needed for pain and I have given you a short course of tramadol as well.  You can also pick up a  counter force elbow brace that will help with pain. You should try throwing wood with out other hand or both hands to avoid that repetitive motion that is causing your pain.   Please come back in 6 weeks and we can reevaluate.   Take care, Davena Julian L. Rosalyn Gess, [facility] Resident PGY-2 03/17/2017 9:52 AM

## 2017-03-17 NOTE — Progress Notes (Signed)
    Subjective:  Timothy Martinez is a 66 y.o. male who presents to the [facility] today for diabetes follow up.   HPI:  DIABETES Type II Medications: Reports taking and tolerating without side effects. Blood Sugars per patient: does not take Diet- egg sandwich for breakfast, sandwich for lunch and lots of water (no sodas) Regular Exercise- working   Health Maintenance Due  Topic Date Due  . Samul Dada  10/27/1970  . COLONOSCOPY  10/26/2001  . PNA vac Low Risk Adult (1 of 2 - PCV13) 10/26/2016  . INFLUENZA VACCINE  02/22/2017   On Aspirin-takes xarelto On statin-yes Daily foot monitoring-yes Last eye exam: UTD Last foot exam: UTD Nephropathy screen: UTD  ROS- Denies Polyuria,Polydipsia, nocturia, Vision changes, feet or hand numbness/pain/tingling. Denies Hypoglycemia symptoms (shaky, sweaty, hungry, weak anxious, tremor, palpitations, confusion, behavior change).   Right elbow pain: Worse with lateral rotation of elbow for past month.  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   Hemoglobin a1c:  Lab Results  Component Value Date   HGBA1C 6.0 12/01/2016   HGBA1C 6.0 02/11/2016   HGBA1C 6.5 06/23/2015   Tobacco use: 1ppd Medication: reviewed and updated ROS: see HPI   Objective:  Physical Exam: BP 122/64   Pulse 60   Temp 97.7 F (36.5 C) (Oral)   Ht 6\' 3"  (1.905 m)   Wt 174 lb 9.6 oz (79.2 kg)   SpO2 100%   BMI 21.82 kg/m   Gen: 76 M in NAD, resting comfortably CV: RRR with no murmurs appreciated Pulm: NWOB, CTAB with no crackles, wheezes, or rhonchi GI: Normal bowel sounds present. Soft, Nontender, Nondistended. MSK: no edema, cyanosis, or clubbing noted, TTP at lateral epicondyle, FROM Skin: warm, dry Neuro: grossly normal, moves all extremities Psych: Normal affect and thought content  No results found for this or any previous visit (from the past 72 hour(s)).   Assessment/Plan:  No problem-specific Assessment & Plan notes  found for this encounter.

## 2017-04-21 ENCOUNTER — Other Ambulatory Visit: Payer: Self-pay | Admitting: Cardiovascular Disease

## 2017-05-03 ENCOUNTER — Inpatient Hospital Stay (HOSPITAL_COMMUNITY)
Admission: EM | Admit: 2017-05-03 | Discharge: 2017-05-05 | DRG: 439 | Disposition: A | Discharge status: Home / Self Care | Payer: Medicare Other | Attending: Family Medicine | Admitting: Family Medicine

## 2017-05-03 ENCOUNTER — Encounter (HOSPITAL_COMMUNITY): Payer: Self-pay | Admitting: Emergency Medicine

## 2017-05-03 ENCOUNTER — Emergency Department (HOSPITAL_COMMUNITY): Payer: Medicare Other

## 2017-05-03 DIAGNOSIS — Z880 Allergy status to penicillin: Secondary | ICD-10-CM

## 2017-05-03 DIAGNOSIS — E119 Type 2 diabetes mellitus without complications: Secondary | ICD-10-CM | POA: Diagnosis present

## 2017-05-03 DIAGNOSIS — I482 Chronic atrial fibrillation, unspecified: Secondary | ICD-10-CM | POA: Diagnosis present

## 2017-05-03 DIAGNOSIS — J449 Chronic obstructive pulmonary disease, unspecified: Secondary | ICD-10-CM | POA: Diagnosis present

## 2017-05-03 DIAGNOSIS — Z79899 Other long term (current) drug therapy: Secondary | ICD-10-CM | POA: Diagnosis not present

## 2017-05-03 DIAGNOSIS — I4821 Permanent atrial fibrillation: Secondary | ICD-10-CM | POA: Diagnosis present

## 2017-05-03 DIAGNOSIS — Z885 Allergy status to narcotic agent status: Secondary | ICD-10-CM

## 2017-05-03 DIAGNOSIS — Z7984 Long term (current) use of oral hypoglycemic drugs: Secondary | ICD-10-CM

## 2017-05-03 DIAGNOSIS — Z888 Allergy status to other drugs, medicaments and biological substances status: Secondary | ICD-10-CM

## 2017-05-03 DIAGNOSIS — R109 Unspecified abdominal pain: Secondary | ICD-10-CM | POA: Diagnosis not present

## 2017-05-03 DIAGNOSIS — I10 Essential (primary) hypertension: Secondary | ICD-10-CM | POA: Diagnosis present

## 2017-05-03 DIAGNOSIS — K8500 Idiopathic acute pancreatitis without necrosis or infection: Secondary | ICD-10-CM | POA: Diagnosis not present

## 2017-05-03 DIAGNOSIS — I4892 Unspecified atrial flutter: Secondary | ICD-10-CM | POA: Diagnosis present

## 2017-05-03 DIAGNOSIS — K219 Gastro-esophageal reflux disease without esophagitis: Secondary | ICD-10-CM | POA: Diagnosis not present

## 2017-05-03 DIAGNOSIS — F17210 Nicotine dependence, cigarettes, uncomplicated: Secondary | ICD-10-CM | POA: Diagnosis present

## 2017-05-03 DIAGNOSIS — E785 Hyperlipidemia, unspecified: Secondary | ICD-10-CM | POA: Diagnosis present

## 2017-05-03 DIAGNOSIS — J439 Emphysema, unspecified: Secondary | ICD-10-CM | POA: Diagnosis present

## 2017-05-03 DIAGNOSIS — F1011 Alcohol abuse, in remission: Secondary | ICD-10-CM | POA: Diagnosis present

## 2017-05-03 DIAGNOSIS — R110 Nausea: Secondary | ICD-10-CM | POA: Diagnosis not present

## 2017-05-03 DIAGNOSIS — Z9119 Patient's noncompliance with other medical treatment and regimen: Secondary | ICD-10-CM

## 2017-05-03 DIAGNOSIS — K852 Alcohol induced acute pancreatitis without necrosis or infection: Principal | ICD-10-CM | POA: Diagnosis present

## 2017-05-03 DIAGNOSIS — R911 Solitary pulmonary nodule: Secondary | ICD-10-CM | POA: Diagnosis present

## 2017-05-03 DIAGNOSIS — Z7901 Long term (current) use of anticoagulants: Secondary | ICD-10-CM | POA: Diagnosis not present

## 2017-05-03 DIAGNOSIS — K2970 Gastritis, unspecified, without bleeding: Secondary | ICD-10-CM | POA: Diagnosis not present

## 2017-05-03 HISTORY — DX: Other microscopic hematuria: R31.29

## 2017-05-03 HISTORY — DX: Low back pain: M54.5

## 2017-05-03 HISTORY — DX: Acute pancreatitis without necrosis or infection, unspecified: K85.90

## 2017-05-03 HISTORY — DX: Lateral epicondylitis, right elbow: M77.11

## 2017-05-03 HISTORY — DX: Major depressive disorder, single episode, unspecified: F32.9

## 2017-05-03 HISTORY — DX: Polyneuropathy, unspecified: G62.9

## 2017-05-03 HISTORY — DX: Tobacco use: Z72.0

## 2017-05-03 HISTORY — DX: Moderate protein-calorie malnutrition: E44.0

## 2017-05-03 HISTORY — DX: Anxiety disorder, unspecified: F41.9

## 2017-05-03 HISTORY — DX: Pain in left foot: M79.672

## 2017-05-03 HISTORY — DX: Long term (current) use of anticoagulants: Z79.01

## 2017-05-03 HISTORY — DX: Permanent atrial fibrillation: I48.21

## 2017-05-03 HISTORY — DX: Cervicalgia: M54.2

## 2017-05-03 HISTORY — DX: Unspecified atrial flutter: I48.92

## 2017-05-03 HISTORY — DX: Other chronic pancreatitis: K86.1

## 2017-05-03 HISTORY — DX: Chronic obstructive pulmonary disease, unspecified: J44.9

## 2017-05-03 HISTORY — DX: Insomnia, unspecified: G47.00

## 2017-05-03 HISTORY — DX: Pain in right arm: M79.601

## 2017-05-03 LAB — CBC
HCT: 47.6 % (ref 39.0–52.0)
Hemoglobin: 15.8 g/dL (ref 13.0–17.0)
MCH: 31 pg (ref 26.0–34.0)
MCHC: 33.2 g/dL (ref 30.0–36.0)
MCV: 93.5 fL (ref 78.0–100.0)
Platelets: 239 10*3/uL (ref 150–400)
RBC: 5.09 MIL/uL (ref 4.22–5.81)
RDW: 13.7 % (ref 11.5–15.5)
WBC: 9.5 10*3/uL (ref 4.0–10.5)

## 2017-05-03 LAB — URINALYSIS, ROUTINE W REFLEX MICROSCOPIC
Bacteria, UA: NONE SEEN
Bilirubin Urine: NEGATIVE
Glucose, UA: NEGATIVE mg/dL
Ketones, ur: NEGATIVE mg/dL
Leukocytes, UA: NEGATIVE
Nitrite: NEGATIVE
Protein, ur: NEGATIVE mg/dL
Specific Gravity, Urine: 1.011 (ref 1.005–1.030)
Squamous Epithelial / LPF: NONE SEEN
WBC, UA: NONE SEEN WBC/hpf (ref 0–5)
pH: 6 (ref 5.0–8.0)

## 2017-05-03 LAB — COMPREHENSIVE METABOLIC PANEL
ALT: 16 U/L — ABNORMAL LOW (ref 17–63)
AST: 25 U/L (ref 15–41)
Albumin: 3.9 g/dL (ref 3.5–5.0)
Alkaline Phosphatase: 50 U/L (ref 38–126)
Anion gap: 10 (ref 5–15)
BUN: 5 mg/dL — ABNORMAL LOW (ref 6–20)
CO2: 30 mmol/L (ref 22–32)
Calcium: 9.6 mg/dL (ref 8.9–10.3)
Chloride: 99 mmol/L — ABNORMAL LOW (ref 101–111)
Creatinine, Ser: 0.8 mg/dL (ref 0.61–1.24)
GFR calc Af Amer: >60 mL/min (ref >60)
GFR calc non Af Amer: >60 mL/min (ref >60)
Glucose, Bld: 144 mg/dL — ABNORMAL HIGH (ref 65–99)
Potassium: 5.4 mmol/L — ABNORMAL HIGH (ref 3.5–5.1)
Sodium: 139 mmol/L (ref 135–145)
Total Bilirubin: 0.6 mg/dL (ref 0.3–1.2)
Total Protein: 7.4 g/dL (ref 6.5–8.1)

## 2017-05-03 LAB — BASIC METABOLIC PANEL
Anion gap: 9 (ref 5–15)
BUN: 5 mg/dL — ABNORMAL LOW (ref 6–20)
CO2: 25 mmol/L (ref 22–32)
Calcium: 9.2 mg/dL (ref 8.9–10.3)
Chloride: 104 mmol/L (ref 101–111)
Creatinine, Ser: 0.74 mg/dL (ref 0.61–1.24)
GFR calc Af Amer: >60 mL/min (ref >60)
GFR calc non Af Amer: >60 mL/min (ref >60)
Glucose, Bld: 91 mg/dL (ref 65–99)
Potassium: 5 mmol/L (ref 3.5–5.1)
Sodium: 138 mmol/L (ref 135–145)

## 2017-05-03 LAB — LIPASE, BLOOD: Lipase: 718 U/L — ABNORMAL HIGH (ref 11–51)

## 2017-05-03 LAB — ETHANOL: Alcohol, Ethyl (B): <10 mg/dL (ref <10)

## 2017-05-03 LAB — LACTATE DEHYDROGENASE: LDH: 173 U/L (ref 98–192)

## 2017-05-03 LAB — GLUCOSE, CAPILLARY: Glucose-Capillary: 166 mg/dL — ABNORMAL HIGH (ref 65–99)

## 2017-05-03 MED ORDER — LORAZEPAM 1 MG PO TABS: 1.0000 mg | ORAL_TABLET | Freq: Four times a day (QID) | ORAL | Status: DC | PRN

## 2017-05-03 MED ORDER — FOLIC ACID 1 MG PO TABS
1.0000 mg | ORAL_TABLET | Freq: Every day | ORAL | Status: DC
Start: 2017-05-03 — End: 2017-05-05
  Administered 2017-05-04 – 2017-05-05 (×2): 1 mg via ORAL
  Filled 2017-05-03 (×2): qty 1

## 2017-05-03 MED ORDER — HYDROMORPHONE HCL 1 MG/ML IJ SOLN
1.0000 mg | INTRAMUSCULAR | Status: DC | PRN
  Administered 2017-05-03 – 2017-05-04 (×4): 1 mg via INTRAVENOUS
  Filled 2017-05-03 (×4): qty 1

## 2017-05-03 MED ORDER — INSULIN ASPART 100 UNIT/ML ~~LOC~~ SOLN
0.0000 [IU] | Freq: Three times a day (TID) | SUBCUTANEOUS | Status: DC
  Administered 2017-05-04 (×2): 2 [IU] via SUBCUTANEOUS
  Administered 2017-05-05: 1 [IU] via SUBCUTANEOUS

## 2017-05-03 MED ORDER — VITAMIN B-1 100 MG PO TABS
100.0000 mg | ORAL_TABLET | Freq: Every day | ORAL | Status: DC
  Administered 2017-05-04 – 2017-05-05 (×2): 100 mg via ORAL
  Filled 2017-05-03 (×2): qty 1

## 2017-05-03 MED ORDER — THIAMINE HCL 100 MG/ML IJ SOLN
100.0000 mg | Freq: Every day | INTRAMUSCULAR | Status: DC
Start: 2017-05-03 — End: 2017-05-04

## 2017-05-03 MED ORDER — NICOTINE 7 MG/24HR TD PT24
7.0000 mg | MEDICATED_PATCH | Freq: Every day | TRANSDERMAL | Status: DC
  Administered 2017-05-03 – 2017-05-05 (×3): 7 mg via TRANSDERMAL
  Filled 2017-05-03 (×3): qty 1

## 2017-05-03 MED ORDER — SODIUM CHLORIDE 0.9 % IV BOLUS (SEPSIS)
500.0000 mL | Freq: Once | INTRAVENOUS | Status: AC
  Administered 2017-05-03: 500 mL via INTRAVENOUS

## 2017-05-03 MED ORDER — ONDANSETRON HCL 4 MG/2ML IJ SOLN
4.0000 mg | Freq: Once | INTRAMUSCULAR | Status: DC
  Filled 2017-05-03: qty 2

## 2017-05-03 MED ORDER — SODIUM CHLORIDE 0.9 % IV SOLN
INTRAVENOUS | Status: DC
  Administered 2017-05-03 – 2017-05-04 (×2): via INTRAVENOUS

## 2017-05-03 MED ORDER — IOPAMIDOL (ISOVUE-300) INJECTION 61%
INTRAVENOUS | Status: AC
  Administered 2017-05-03: 100 mL
  Filled 2017-05-03: qty 100

## 2017-05-03 MED ORDER — ONDANSETRON 4 MG PO TBDP
ORAL_TABLET | ORAL | Status: AC
  Filled 2017-05-03: qty 1

## 2017-05-03 MED ORDER — ALBUTEROL SULFATE (2.5 MG/3ML) 0.083% IN NEBU
3.0000 mL | INHALATION_SOLUTION | RESPIRATORY_TRACT | Status: DC | PRN
  Administered 2017-05-03 – 2017-05-04 (×2): 3 mL via RESPIRATORY_TRACT
  Filled 2017-05-03 (×2): qty 3

## 2017-05-03 MED ORDER — RIVAROXABAN 20 MG PO TABS
20.0000 mg | ORAL_TABLET | Freq: Every day | ORAL | Status: DC
  Administered 2017-05-03 – 2017-05-04 (×2): 20 mg via ORAL
  Filled 2017-05-03 (×2): qty 1

## 2017-05-03 MED ORDER — INSULIN ASPART 100 UNIT/ML ~~LOC~~ SOLN: 0.0000 [IU] | Freq: Every day | SUBCUTANEOUS | Status: DC

## 2017-05-03 MED ORDER — HYDROMORPHONE HCL 1 MG/ML IJ SOLN
1.0000 mg | Freq: Once | INTRAMUSCULAR | Status: AC
  Administered 2017-05-03: 1 mg via INTRAVENOUS
  Filled 2017-05-03: qty 1

## 2017-05-03 MED ORDER — ONDANSETRON 4 MG PO TBDP
4.0000 mg | ORAL_TABLET | Freq: Once | ORAL | Status: AC | PRN
  Administered 2017-05-03: 4 mg via ORAL

## 2017-05-03 MED ORDER — LORAZEPAM 2 MG/ML IJ SOLN: 1.0000 mg | Freq: Four times a day (QID) | INTRAMUSCULAR | Status: DC | PRN

## 2017-05-03 MED ORDER — METOPROLOL TARTRATE 25 MG PO TABS
25.0000 mg | ORAL_TABLET | Freq: Two times a day (BID) | ORAL | Status: DC
  Administered 2017-05-03 – 2017-05-05 (×4): 25 mg via ORAL
  Filled 2017-05-03 (×4): qty 1

## 2017-05-03 MED ORDER — ADULT MULTIVITAMIN W/MINERALS CH
1.0000 | ORAL_TABLET | Freq: Every day | ORAL | Status: DC
  Administered 2017-05-04 – 2017-05-05 (×2): 1 via ORAL
  Filled 2017-05-03 (×2): qty 1

## 2017-05-03 NOTE — H&P (Signed)
[facility] Admission History and Physical Service Pager: [phone]  Patient name: Timothy Martinez Medical record number: [id number] Date of birth: November 19, 1950 Age: 66 y.o. Gender: male  Primary Care Provider: Eloise Levels, MD Consultants:  Code Status: full (obtained on admission)  Chief Complaint: abdominal pain  Assessment and Plan: Timothy Martinez is a 66 y.o. male presenting with abdominal pain. PMH is significant for alcholic pancreatitis, afib, copd, traumatic splenectomy and multiple back surgeries  Acute pancreatitis- history, exam, blood work (elevated lipase) and CT finding consistent with acute pancreatitis.  Patient claiming similar presentation from past after alcohol consupmtion. CT didn't show  gallstones or appendicitis. Patient with global tenderness to palpation. Hard assess Murphy sign but CT didn't show cholelithiasis or cholecystitis. No rebound tenderness to think of peritonitis or appendicitis. patient is hemodynamically stable. CT severity index 1-2 which project is to estimated mortality rate of 1-3 an estimated complication rate of 2-4%. So he suitable to floor.  -Admit to med-surg, Dr. Ardelia Mems -dilaudid 17m q3 as needed for pain -clear liquid diet -zofran for nausea -IVF NS 150/hr -CMP/CBC/lipase the morning -Add LDH  Afib/flutter: cMalivasc score 2 (age and diabetes), 3 if hypertension -home metoprolol -home xarelto -PT-INR  DM-2: On metformin at home. Last A1c 5.9 on 03/17/2017. -SSI-thin -CBG before meals and at bedtime  COPD-stable -home albuterol  Alcohol use- states first time drinking in ~yr was 10/9.  -ethanol level pending -lactate dehydrogenase pending -CIWA  Lung nodule-68mon CT -followup in 6-12 and then at 18-24 months non-contrast chest CT, noted in discharge summary  FEN/GI: NPO/zofran  Prophylaxis: home xarelto (pt-inr ordered)  Disposition: admit to med-surg  History of Present Illness:   Timothy Martinez a [age].o. male presenting with hx of abdominal pain since 3am 10/10 after drinking 6 beers for the first time in ~[id number] night.   This pain is consistent with other episodes of pancreatitis that have required hospitalization which were also connected to alcohol use.    He has 12/12 abdominal pain (5/10 after dilaudid), has been dry heaving but not vomiting, no diarrhea, no urinary symptoms, subjective fever.  Has had a dry cough that he says is consistent with his copd.  Has not eaten since dinner 10/9, he tried to drink a cup of coffe over night but could only get a few sips down.   Pain was so severe he did not think he could drive and he walked next door to the fire station  No history of or concern for alochol withdrawal, zofran resolved his nausea. No recent illness or sick contacs. Reports good compliance with his meds although due to sickness he has not had any meds 10/10 prior to presentation.  LBM was 10/9 AM  Smoked a pack a day since he was [age] of age, does not get withdrawal symptoms and declined patc  No liver/kidney/gastro issue as far as he knows besides pancreatitis  Review Of Systems: Per HPI with the following additions  ROS  Patient Active Problem List   Diagnosis Date Noted  . Lateral epicondylitis of right elbow 03/17/2017  . Pain of right upper extremity 05/30/2016  . Malnutrition of moderate degree 05/18/2016  . Acute pancreatitis 05/17/2016  . Acute on chronic pancreatitis ([facility]0/24/2017  . Chronic atrial fibrillation ([facility] . Chronic anticoagulation   . Insomnia 02/11/2016  . History of atrial fibrillation 06/19/2015  . On continuous oral anticoagulation 06/19/2015  . Neuropathy 03/16/2015  .  Hematuria, microscopic 02/13/2015  . Anxiety and depression 01/20/2015  . Alcohol-induced acute pancreatitis   . Neck pain, bilateral posterior 10/15/2014  . Chest pain   . Atrial flutter, unspecified   . Atrial fibrillation with rapid  ventricular response ([place])   . COLD (chronic obstructive lung disease) ([place])   . Pancreatitis 08/04/2014  . Left foot pain 10/12/2013  . Low back pain 12/21/2012  . Polyuria 08/21/2012  . COPD (chronic obstructive pulmonary disease) ([place]) 04/27/2012  . GERD (gastroesophageal reflux disease) 03/05/2012  . Alcohol abuse 06/09/2011  . Diabetes mellitus ([place]) 05/30/2011  . Chronic alcoholic pancreatitis ([place]) 05/30/2011  . Hyperlipidemia 05/30/2011  . Tobacco abuse 05/30/2011    Past Medical History: Past Medical History:  Diagnosis Date  . Alcohol abuse   . Alcoholic pancreatitis   . Anginal pain ([place])   . Arthritis    "eat up w/it" (05/17/2016)  . Atrial fibrillation status post cardioversion ([place]) 08/2014  . CHF (congestive heart failure) ([place])   . Chronic back pain   . Chronic bronchitis ([place])   . COPD (chronic obstructive pulmonary disease) ([place])   . Daily headache    "need eye examined" (05/17/2016)  . GERD (gastroesophageal reflux disease)   . History of blood transfusion 2003   related to "spleen OR"  . Hyperlipidemia   . Hypertension   . Pancreatic pseudocyst    seen on CT scan 07/2014  . Pneumonia    "?a few times" (05/17/2016)  . Shortness of breath    with ambulation  . Tobacco use disorder, continuous   . Type II diabetes mellitus ([place])     Past Surgical History: Past Surgical History:  Procedure Laterality Date  . BACK SURGERY    . CARDIOVERSION N/A 09/22/2014   Procedure: CARDIOVERSION;  Surgeon: Thayer Headings, MD;  Location: [facility] ENDOSCOPY;  Service: Cardiovascular;  Laterality: N/A;  . NECK SURGERY  1974   not cervical, states had lesion on neck which was removed  . POSTERIOR LAMINECTOMY / DECOMPRESSION LUMBAR SPINE  10/2006   of L4, L5 and S1 with a 5-1 diskectomy, microdissection with the microscope/notes 10/24/2006  . SPLENECTOMY  2003    Social History: Social History  Substance Use Topics  . Smoking status: Current Every Day Smoker     Packs/day: 1.00    Years: 52.00    Types: Cigarettes    Start date: 04/09/1964  . Smokeless tobacco: Never Used     Comment: down from 2.5 ppd  . Alcohol use Yes     Comment: drinking 6 beers per week   Additional social history:   Please also refer to relevant sections of EMR.  Family History: Family History  Problem Relation Age of Onset  . Diabetes Mother   . Aneurysm Mother   . Stroke Mother   . Hypertension Mother   . Alcohol abuse Father   . Cancer Father        Lung  . Cancer Sister        Lung Cancer  . Post-traumatic stress disorder Brother   . Heart attack Neg Hx    (If not completed, MUST add something in)  Allergies and Medications: Allergies  Allergen Reactions  . Prozac [Fluoxetine Hcl] Nausea Only    Very sick on stomach  . Morphine And Related Swelling    Pt states he can tolerate oxycodone and hydromorphone  . Penicillins Rash    Has patient had a PCN reaction causing immediate rash, facial/tongue/throat swelling, SOB  or lightheadedness with hypotension: Yes Has patient had a PCN reaction causing severe rash involving mucus membranes or skin necrosis: No Has patient had a PCN reaction that required hospitalization: No Has patient had a PCN reaction occurring within the last 10 years: No If all of the above answers are "NO", then may proceed with Cephalosporin use.    No current facility-administered medications on file prior to encounter.    Current Outpatient Prescriptions on File Prior to Encounter  Medication Sig Dispense Refill  . atorvastatin (LIPITOR) 40 MG tablet Take 1 tablet (40 mg total) by mouth daily at 6 PM. 90 tablet 3  . Blood Glucose Monitoring Suppl (ONE TOUCH ULTRA 2) W/DEVICE KIT Dispense one glucometer. Diagnosis Type 2 Diabetes. E11.9 1 each 0  . cyanocobalamin 500 MCG tablet Take 500 mcg by mouth daily.    . famotidine (PEPCID) 40 MG tablet Take 40 mg by mouth every other day.    Marland Kitchen glucose blood (ONE TOUCH ULTRA TEST) test  strip Use check fasting blood glucose qAM. Type 2 Diabetes E11.9 100 each 3  . metFORMIN (GLUCOPHAGE) 1000 MG tablet Take 1 tablet (1,000 mg total) by mouth 2 (two) times daily with a meal. 180 tablet 3  . metoprolol tartrate (LOPRESSOR) 50 MG tablet Take 1.5 tablets (75 mg total) by mouth 2 (two) times daily. 270 tablet 3  . ONE TOUCH LANCETS MISC Check fasting blood glucose qAM. Type 2 DM. E11.9 200 each 1  . XARELTO 20 MG TABS tablet TAKE 1 TABLET (20 MG TOTAL) BY MOUTH DAILY WITH SUPPER. 30 tablet 5  . albuterol (PROVENTIL HFA;VENTOLIN HFA) 108 (90 Base) MCG/ACT inhaler Inhale 1-2 puffs into the lungs every 12 (twelve) hours as needed for wheezing or shortness of breath. (Patient not taking: Reported on 05/03/2017) 1 Inhaler 3  . traMADol (ULTRAM) 50 MG tablet Take 1 tablet (50 mg total) by mouth every 6 (six) hours as needed. (Patient not taking: Reported on 05/03/2017) 30 tablet 0    Objective: BP 123/73   Pulse 70   Temp (!) 97.5 F (36.4 C) (Oral)   Resp 17   SpO2 95%  Exam: General: uncomfortable but alert and non-toxic appearing. Eyes: no deficits noted or reported Cardiovascular: RRR, no murmurs noted, trace edema in legs bilaterally Respiratory: 1-2 coughs during exam, no wheezing on ausculation, course lungs sounds but consisted air movement at all locations, satting ~94 RA Gastrointestinal: bowel sounds throughout, significant tenderness RUQ/central and less RLQ. Global tenderness to palpation. No rebound tenderness. Hard to assess Murphy sign. MSK: trace edema in legs bilaterally, no gross deficits noted Derm: long healed incision along diaphragm that patient states is from splenectomy Neuro: no gross deficits noted Psych: pleasant/coherent  Labs and Imaging: CBC BMET   Recent Labs Lab 05/03/17 0753  WBC 9.5  HGB 15.8  HCT 47.6  PLT 239    Recent Labs Lab 05/03/17 0753  NA 139  K 5.4*  CL 99*  CO2 30  BUN 5*  CREATININE 0.80  GLUCOSE 144*  CALCIUM 9.6      Lipase 718  Ct Abdomen Pelvis W Contrast  Result Date: 05/03/2017 CLINICAL DATA:  Acute epigastric abdominal pain. EXAM: CT ABDOMEN AND PELVIS WITH CONTRAST TECHNIQUE: Multidetector CT imaging of the abdomen and pelvis was performed using the standard protocol following bolus administration of intravenous contrast. CONTRAST:  162m ISOVUE-300 IOPAMIDOL (ISOVUE-300) INJECTION 61% COMPARISON:  CT scan of August 04, 2014. FINDINGS: Lower chest: 6 mm right middle lobe nodule  is noted. No other acute abnormality seen in the visualized lung bases. Hepatobiliary: No focal liver abnormality is seen. No gallstones, gallbladder wall thickening, or biliary dilatation. Pancreas: There is a large amount of fluid seen around the pancreatic head concerning for acute pancreatitis. Stable dilatation of pancreatic duct is noted. Intramural pseudocyst noted on prior exam is not definitively identified on current exam. Spleen: Status post splenectomy. Adrenals/Urinary Tract: Adrenal glands are unremarkable. Kidneys are normal, without renal calculi, focal lesion, or hydronephrosis. Bladder is unremarkable. Stomach/Bowel: There is no evidence of bowel obstruction. The stomach appears normal. The appendix appears normal. Vascular/Lymphatic: Aortic atherosclerosis. No enlarged abdominal or pelvic lymph nodes. Reproductive: Prostate is unremarkable. Other: No abdominal wall hernia or abnormality. No abdominopelvic ascites. Musculoskeletal: No acute or significant osseous findings. IMPRESSION: Large amount of inflammatory changes are noted around pancreatic head and second portion of duodenum most consistent with acute pancreatitis, or less likely severe peptic ulcer disease. Laboratory and clinical correlation is recommended. Stable dilatation of pancreatic duct is noted. 6 mm right middle lobe nodule is noted. Non-contrast chest CT at 6-12 months is recommended. If the nodule is stable at time of repeat CT, then future CT at  18-24 months (from today's scan) is considered optional for low-risk patients, but is recommended for high-risk patients. This recommendation follows the consensus statement: Guidelines for Management of Incidental Pulmonary Nodules Detected on CT Images: From the Fleischner Society 2017; Radiology 2017; 284:228-243. Electronically Signed   By: Marijo Conception, M.D.   On: 05/03/2017 12:22     Sherene Sires, DO 05/03/2017, 3:24 PM PGY-1, [place] Intern pager: [phone], text pages welcome  I have seen and evaluated the patient with Dr. Criss Rosales. I am in agreement with the note above in its revised form. My additions are in red.  Wendee Beavers, MD, PGY-3 05/03/2017 5:07 PM

## 2017-05-03 NOTE — Discharge Summary (Signed)
[facility] Discharge Summary  Patient name: Timothy Martinez Medical record number: [id number] Date of birth: [birth year] Age: 66 y.o. Gender: male Date of Admission: 05/03/2017  Date of Discharge: 05/05/17  Admitting Physician: Leeanne Rio, MD  Primary Care Provider: Eloise Levels, MD Consultants:   Indication for Hospitalization: pancreatitis  Discharge Diagnoses/Problem List:  Pancreatitis, tobacco use, DM2  Disposition: to home  Discharge Condition: stable  Discharge Exam: General: uncomfortable but alert and non-toxic appearing. Eyes: no deficits noted or reported Cardiovascular: RRR, no murmurs noted, no edema in legs bilaterally Respiratory:  no wheezing on ausculation, course lungs sounds but consisted air movement at all locations Gastrointestinal: bowel sounds throughout, significant tenderness more focalized to left side. Global tenderness to palpation. No rebound tenderness. No Murphy sign. MSK: trace edema in legs bilaterally, no gross deficits noted Derm: long healed incision along diaphragm that patient states is from splenectomy Neuro: no gross deficits noted Psych: pleasant/coherent  Brief Hospital Course:  Patient admitted for pancreatitis confirmed by CT with lipase >700.   Placed NPO and given IVF overnight with significant improvement in pain and lipase <200.    Diet was advanced during the 2nd day and by morning of 10/12 patient was tolerating regular food and need only infrequent percocet 5 for pain control.  He was discharged to [facility]up [facility].  Issues for Follow Up:  1. 75m lung nodule w/ Hx of smoking:  6 mm right middle lobe nodule is noted. Non-contrast chest CT at 6-12 months is recommended. If the nodule is stable at time of repeat CT, then future CT at 18-24 months (from today's scan) is considered optional for low-risk patients, but is recommended for high-risk patients. This recommendation follows the  consensus statement: Guidelines for Management of Incidental Pulmonary Nodules Detected on CT Images: From the Fleischner Society 2017; Radiology 2017; 284:228-243. 2. Patient wanted assistance with arranging tobacco cessation 3. Followup with patient about plans for abstaining from alcohol  Significant Procedures:   Significant Labs and Imaging:   Recent Labs Lab 05/03/17 0753 05/04/17 0701 05/05/17 0521  WBC 9.5 9.1 12.4*  HGB 15.8 14.1 14.9  HCT 47.6 42.4 44.9  PLT 239 238 244    Recent Labs Lab 05/03/17 0753 05/03/17 1906 05/04/17 0701 05/05/17 0521  NA 139 138 138 136  K 5.4* 5.0 4.5 4.2  CL 99* 104 102 102  CO2 '30 25 30 23  ' GLUCOSE 144* 91 108* 142*  BUN 5* 5* 5* 5*  CREATININE 0.80 0.74 0.71 0.81  CALCIUM 9.6 9.2 9.0 9.4  ALKPHOS 50  --  47 50  AST 25  --  19 20  ALT 16*  --  13* 14*  ALBUMIN 3.9  --  3.5 3.8    Ct Abdomen Pelvis W Contrast  Result Date: 05/03/2017 CLINICAL DATA:  Acute epigastric abdominal pain. EXAM: CT ABDOMEN AND PELVIS WITH CONTRAST TECHNIQUE: Multidetector CT imaging of the abdomen and pelvis was performed using the standard protocol following bolus administration of intravenous contrast. CONTRAST:  1057mISOVUE-300 IOPAMIDOL (ISOVUE-300) INJECTION 61% COMPARISON:  CT scan of August 04, 2014. FINDINGS: Lower chest: 6 mm right middle lobe nodule is noted. No other acute abnormality seen in the visualized lung bases. Hepatobiliary: No focal liver abnormality is seen. No gallstones, gallbladder wall thickening, or biliary dilatation. Pancreas: There is a large amount of fluid seen around the pancreatic head concerning for acute pancreatitis. Stable dilatation of pancreatic duct is noted. Intramural pseudocyst noted  on prior exam is not definitively identified on current exam. Spleen: Status post splenectomy. Adrenals/Urinary Tract: Adrenal glands are unremarkable. Kidneys are normal, without renal calculi, focal lesion, or hydronephrosis. Bladder  is unremarkable. Stomach/Bowel: There is no evidence of bowel obstruction. The stomach appears normal. The appendix appears normal. Vascular/Lymphatic: Aortic atherosclerosis. No enlarged abdominal or pelvic lymph nodes. Reproductive: Prostate is unremarkable. Other: No abdominal wall hernia or abnormality. No abdominopelvic ascites. Musculoskeletal: No acute or significant osseous findings. IMPRESSION: Large amount of inflammatory changes are noted around pancreatic head and second portion of duodenum most consistent with acute pancreatitis, or less likely severe peptic ulcer disease. Laboratory and clinical correlation is recommended. Stable dilatation of pancreatic duct is noted. 6 mm right middle lobe nodule is noted. Non-contrast chest CT at 6-12 months is recommended. If the nodule is stable at time of repeat CT, then future CT at 18-24 months (from today's scan) is considered optional for low-risk patients, but is recommended for high-risk patients. This recommendation follows the consensus statement: Guidelines for Management of Incidental Pulmonary Nodules Detected on CT Images: From the Fleischner Society 2017; Radiology 2017; 284:228-243. Electronically Signed   By: Marijo Conception, M.D.   On: 05/03/2017 12:22    Results/Tests Pending at Time of Discharge:   Discharge Medications:  Allergies as of 05/05/2017      Reactions   Prozac [fluoxetine Hcl] Nausea Only   Very sick on stomach   Morphine And Related Swelling   Pt states he can tolerate oxycodone and hydromorphone   Penicillins Rash   Has patient had a PCN reaction causing immediate rash, facial/tongue/throat swelling, SOB or lightheadedness with hypotension: Yes Has patient had a PCN reaction causing severe rash involving mucus membranes or skin necrosis: No Has patient had a PCN reaction that required hospitalization: No Has patient had a PCN reaction occurring within the last 10 years: No If all of the above answers are "NO", then  may proceed with Cephalosporin use.      Medication List    STOP taking these medications   traMADol 50 MG tablet Commonly known as:  ULTRAM     TAKE these medications   albuterol 108 (90 Base) MCG/ACT inhaler Commonly known as:  PROVENTIL HFA;VENTOLIN HFA Inhale 1-2 puffs into the lungs every 12 (twelve) hours as needed for wheezing or shortness of breath.   atorvastatin 40 MG tablet Commonly known as:  LIPITOR Take 1 tablet (40 mg total) by mouth daily at 6 PM.   cyanocobalamin 500 MCG tablet Take 500 mcg by mouth daily.   famotidine 40 MG tablet Commonly known as:  PEPCID Take 40 mg by mouth every other day.   glucose blood test strip Commonly known as:  ONE TOUCH ULTRA TEST Use check fasting blood glucose qAM. Type 2 Diabetes E11.9   metFORMIN 1000 MG tablet Commonly known as:  GLUCOPHAGE Take 1 tablet (1,000 mg total) by mouth 2 (two) times daily with a meal.   metoprolol tartrate 50 MG tablet Commonly known as:  LOPRESSOR Take 1.5 tablets (75 mg total) by mouth 2 (two) times daily.   multivitamin with minerals Tabs tablet Take 1 tablet by mouth daily.   nicotine 7 mg/24hr patch Commonly known as:  NICODERM CQ - dosed in mg/24 hr Place 1 patch (7 mg total) onto the skin daily.   ONE TOUCH LANCETS Misc Check fasting blood glucose qAM. Type 2 DM. E11.9   ONE TOUCH ULTRA 2 w/Device Kit Dispense one glucometer. Diagnosis Type  2 Diabetes. E11.9   oxyCODONE 5 MG immediate release tablet Commonly known as:  Oxy IR/ROXICODONE Take 1 tablet (5 mg total) by mouth every 6 (six) hours as needed for severe pain.   XARELTO 20 MG Tabs tablet Generic drug:  rivaroxaban TAKE 1 TABLET (20 MG TOTAL) BY MOUTH DAILY WITH SUPPER.       Discharge Instructions: Please refer to Patient Instructions section of EMR for full details.  Patient was counseled important signs and symptoms that should prompt return to medical care, changes in medications, dietary instructions,  activity restrictions, and follow up appointments.   Follow-Up Appointments:   Sherene Sires, DO 05/05/2017, 1:45 PM PGY-1, [place]

## 2017-05-03 NOTE — ED Notes (Signed)
Patient transported to CT 

## 2017-05-03 NOTE — ED Provider Notes (Signed)
[facility] DEPT Provider Note   CSN: [id number] Arrival date & time: 05/03/17  1660     History   Chief Complaint Chief Complaint  Patient presents with  . Pancreatitis    HPI Timothy Martinez is a 66 y.o. male.  Patient complains of abdominal pain and nausea. Patient has a history of pancreatitis   The history is provided by the patient. No language interpreter was used.  Abdominal Pain   This is a recurrent problem. The current episode started yesterday. The problem occurs constantly. The problem has not changed since onset.The pain is associated with alcohol use. The pain is located in the epigastric region. The quality of the pain is aching. The pain is at a severity of 7/10. The pain is moderate. Pertinent negatives include anorexia, diarrhea, frequency, hematuria and headaches. Nothing aggravates the symptoms.    Past Medical History:  Diagnosis Date  . Alcohol abuse   . Alcoholic pancreatitis   . Anginal pain ([place])   . Arthritis    "eat up w/it" (05/17/2016)  . Atrial fibrillation status post cardioversion ([place]) 08/2014  . CHF (congestive heart failure) ([place])   . Chronic back pain   . Chronic bronchitis ([place])   . COPD (chronic obstructive pulmonary disease) ([place])   . Daily headache    "need eye examined" (05/17/2016)  . GERD (gastroesophageal reflux disease)   . History of blood transfusion 2003   related to "spleen OR"  . Hyperlipidemia   . Hypertension   . Pancreatic pseudocyst    seen on CT scan 07/2014  . Pneumonia    "?a few times" (05/17/2016)  . Shortness of breath    with ambulation  . Tobacco use disorder, continuous   . Type II diabetes mellitus [facility])     Patient Active Problem List   Diagnosis Date Noted  . Lateral epicondylitis of right elbow 03/17/2017  . Pain of right upper extremity 05/30/2016  . Malnutrition of moderate degree 05/18/2016  . Acute pancreatitis 05/17/2016  . Acute on chronic pancreatitis ([place]) 05/17/2016  . Chronic  atrial fibrillation ([place])   . Chronic anticoagulation   . Insomnia 02/11/2016  . History of atrial fibrillation 06/19/2015  . On continuous oral anticoagulation 06/19/2015  . Neuropathy 03/16/2015  . Hematuria, microscopic 02/13/2015  . Anxiety and depression 01/20/2015  . Alcohol-induced acute pancreatitis   . Neck pain, bilateral posterior 10/15/2014  . Chest pain   . Atrial flutter, unspecified   . Atrial fibrillation with rapid ventricular response ([place])   . COLD (chronic obstructive lung disease) ([place])   . Pancreatitis 08/04/2014  . Left foot pain 10/12/2013  . Low back pain 12/21/2012  . Polyuria 08/21/2012  . COPD (chronic obstructive pulmonary disease) ([place]) 04/27/2012  . GERD (gastroesophageal reflux disease) 03/05/2012  . Alcohol abuse 06/09/2011  . Diabetes mellitus ([place]) 05/30/2011  . Chronic alcoholic pancreatitis ([place]) 05/30/2011  . Hyperlipidemia 05/30/2011  . Tobacco abuse 05/30/2011    Past Surgical History:  Procedure Laterality Date  . BACK SURGERY    . CARDIOVERSION N/A 09/22/2014   Procedure: CARDIOVERSION;  Surgeon: Thayer Headings, MD;  Location[facility] ENDOSCOPY;  Service: Cardiovascular;  Laterality: N/A;  . NECK SURGERY  1974   not cervical, states had lesion on neck which was removed  . POSTERIOR LAMINECTOMY / DECOMPRESSION LUMBAR SPINE  10/2006   of L4, L5 and S1 with a 5-1 diskectomy, microdissection with the microscope/notes 10/24/2006  . SPLENECTOMY  2003  Home Medications    Prior to Admission medications   Medication Sig Start Date End Date Taking? Authorizing Provider  atorvastatin (LIPITOR) 40 MG tablet Take 1 tablet (40 mg total) by mouth daily at 6 PM. 03/17/17  Yes Eloise Levels, MD  Blood Glucose Monitoring Suppl (ONE TOUCH ULTRA 2) W/DEVICE KIT Dispense one glucometer. Diagnosis Type 2 Diabetes. E11.9 09/12/14  Yes Willeen Niece, MD  cyanocobalamin 500 MCG tablet Take 500 mcg by mouth daily.   Yes [provider]    famotidine (PEPCID) 40 MG tablet Take 40 mg by mouth every other day.   Yes [provider]  glucose blood (ONE TOUCH ULTRA TEST) test strip Use check fasting blood glucose qAM. Type 2 Diabetes E11.9 09/12/14  Yes Willeen Niece, MD  metFORMIN (GLUCOPHAGE) 1000 MG tablet Take 1 tablet (1,000 mg total) by mouth 2 (two) times daily with a meal. 03/17/17  Yes Eloise Levels, MD  metoprolol tartrate (LOPRESSOR) 50 MG tablet Take 1.5 tablets (75 mg total) by mouth 2 (two) times daily. 03/17/17  Yes Eloise Levels, MD  ONE TOUCH LANCETS MISC Check fasting blood glucose qAM. Type 2 DM. E11.9 09/12/14  Yes Willeen Niece, MD  XARELTO 20 MG TABS tablet TAKE 1 TABLET (20 MG TOTAL) BY MOUTH DAILY WITH SUPPER. 03/06/17  Yes Nahser, Wonda Cheng, MD  albuterol (PROVENTIL HFA;VENTOLIN HFA) 108 (90 Base) MCG/ACT inhaler Inhale 1-2 puffs into the lungs every 12 (twelve) hours as needed for wheezing or shortness of breath. Patient not taking: Reported on 05/03/2017 03/13/17   Eloise Levels, MD  metoprolol tartrate (LOPRESSOR) 50 MG tablet TAKE 1.5 TABLETS (75 MG TOTAL) BY MOUTH 2 (TWO) TIMES DAILY. Patient not taking: Reported on 05/03/2017 04/21/17   Nahser, Wonda Cheng, MD  traMADol (ULTRAM) 50 MG tablet Take 1 tablet (50 mg total) by mouth every 6 (six) hours as needed. Patient not taking: Reported on 05/03/2017 03/17/17   Eloise Levels, MD    Family History Family History  Problem Relation Age of Onset  . Diabetes Mother   . Aneurysm Mother   . Stroke Mother   . Hypertension Mother   . Alcohol abuse Father   . Cancer Father        Lung  . Cancer Sister        Lung Cancer  . Post-traumatic stress disorder Brother   . Heart attack Neg Hx     Social History Social History  Substance Use Topics  . Smoking status: Current Every Day Smoker    Packs/day: 1.00    Years: 52.00    Types: Cigarettes    Start date: 04/09/1964  . Smokeless tobacco: Never Used     Comment: down from 2.5 ppd  .  Alcohol use Yes     Comment: drinking 6 beers per week     Allergies   Tramadol; Prozac [fluoxetine hcl]; Morphine and related; and Penicillins   Review of Systems Review of Systems  Constitutional: Negative for appetite change and fatigue.  HENT: Negative for congestion, ear discharge and sinus pressure.   Eyes: Negative for discharge.  Respiratory: Negative for cough.   Cardiovascular: Negative for chest pain.  Gastrointestinal: Positive for abdominal pain. Negative for anorexia and diarrhea.  Genitourinary: Negative for frequency and hematuria.  Musculoskeletal: Negative for back pain.  Skin: Negative for rash.  Neurological: Negative for seizures and headaches.  Psychiatric/Behavioral: Negative for hallucinations.     Physical Exam Updated Vital Signs  BP 130/75   Pulse 64   Temp (!) 97.5 F (36.4 C) (Oral)   Resp (!) 23   SpO2 94%   Physical Exam  Constitutional: He is oriented to person, place, and time. He appears well-developed.  HENT:  Head: Normocephalic.  Eyes: Conjunctivae and EOM are normal. No scleral icterus.  Neck: Neck supple. No thyromegaly present.  Cardiovascular: Normal rate and regular rhythm.  Exam reveals no gallop and no friction rub.   No murmur heard. Pulmonary/Chest: No stridor. He has no wheezes. He has no rales. He exhibits no tenderness.  Abdominal: He exhibits no distension. There is tenderness. There is no rebound.  Musculoskeletal: Normal range of motion. He exhibits no edema.  Lymphadenopathy:    He has no cervical adenopathy.  Neurological: He is oriented to person, place, and time. He exhibits normal muscle tone. Coordination normal.  Skin: No rash noted. No erythema.  Psychiatric: He has a normal mood and affect. His behavior is normal.     ED Treatments / Results  Labs (all labs ordered are listed, but only abnormal results are displayed) Labs Reviewed  LIPASE, BLOOD - Abnormal; Notable for the following:       Result  Value   Lipase 718 (*)    All other components within normal limits  COMPREHENSIVE METABOLIC PANEL - Abnormal; Notable for the following:    Potassium 5.4 (*)    Chloride 99 (*)    Glucose, Bld 144 (*)    BUN 5 (*)    ALT 16 (*)    All other components within normal limits  URINALYSIS, ROUTINE W REFLEX MICROSCOPIC - Abnormal; Notable for the following:    Hgb urine dipstick SMALL (*)    All other components within normal limits  CBC    EKG  EKG Interpretation None       Radiology Ct Abdomen Pelvis W Contrast  Result Date: 05/03/2017 CLINICAL DATA:  Acute epigastric abdominal pain. EXAM: CT ABDOMEN AND PELVIS WITH CONTRAST TECHNIQUE: Multidetector CT imaging of the abdomen and pelvis was performed using the standard protocol following bolus administration of intravenous contrast. CONTRAST:  167m ISOVUE-300 IOPAMIDOL (ISOVUE-300) INJECTION 61% COMPARISON:  CT scan of August 04, 2014. FINDINGS: Lower chest: 6 mm right middle lobe nodule is noted. No other acute abnormality seen in the visualized lung bases. Hepatobiliary: No focal liver abnormality is seen. No gallstones, gallbladder wall thickening, or biliary dilatation. Pancreas: There is a large amount of fluid seen around the pancreatic head concerning for acute pancreatitis. Stable dilatation of pancreatic duct is noted. Intramural pseudocyst noted on prior exam is not definitively identified on current exam. Spleen: Status post splenectomy. Adrenals/Urinary Tract: Adrenal glands are unremarkable. Kidneys are normal, without renal calculi, focal lesion, or hydronephrosis. Bladder is unremarkable. Stomach/Bowel: There is no evidence of bowel obstruction. The stomach appears normal. The appendix appears normal. Vascular/Lymphatic: Aortic atherosclerosis. No enlarged abdominal or pelvic lymph nodes. Reproductive: Prostate is unremarkable. Other: No abdominal wall hernia or abnormality. No abdominopelvic ascites. Musculoskeletal: No acute  or significant osseous findings. IMPRESSION: Large amount of inflammatory changes are noted around pancreatic head and second portion of duodenum most consistent with acute pancreatitis, or less likely severe peptic ulcer disease. Laboratory and clinical correlation is recommended. Stable dilatation of pancreatic duct is noted. 6 mm right middle lobe nodule is noted. Non-contrast chest CT at 6-12 months is recommended. If the nodule is stable at time of repeat CT, then future CT at 18-24 months (from today's scan)  is considered optional for low-risk patients, but is recommended for high-risk patients. This recommendation follows the consensus statement: Guidelines for Management of Incidental Pulmonary Nodules Detected on CT Images: From the Fleischner Society 2017; Radiology 2017; 284:228-243. Electronically Signed   By: Marijo Conception, M.D.   On: 05/03/2017 12:22    Procedures Procedures (including critical care time)  Medications Ordered in ED Medications  ondansetron (ZOFRAN) injection 4 mg (not administered)  albuterol (PROVENTIL HFA;VENTOLIN HFA) 108 (90 Base) MCG/ACT inhaler 2 puff (not administered)  ondansetron (ZOFRAN-ODT) disintegrating tablet 4 mg (4 mg Oral Given 05/03/17 0733)  HYDROmorphone (DILAUDID) injection 1 mg (1 mg Intravenous Given 05/03/17 1042)  sodium chloride 0.9 % bolus 500 mL (0 mLs Intravenous Stopped 05/03/17 1116)  iopamidol (ISOVUE-300) 61 % injection (100 mLs  Contrast Given 05/03/17 1136)  HYDROmorphone (DILAUDID) injection 1 mg (1 mg Intravenous Given 05/03/17 1356)     Initial Impression / Assessment and Plan / ED Course  I have reviewed the triage vital signs and the nursing notes.  Pertinent labs & imaging results that were available during my care of the patient were reviewed by me and considered in my medical decision making (see chart for details).   patient has pancreatitis. He has improved some with pain medicine but still having significant pain.  Patient will be admitted by the family practice service  Final Clinical Impressions(s) / ED Diagnoses   Final diagnoses:  Idiopathic acute pancreatitis without infection or necrosis    New Prescriptions New Prescriptions   No medications on file     Shayde Ferguson, MD 05/03/17 1406

## 2017-05-03 NOTE — ED Triage Notes (Signed)
Per EMS- pt has hx of pacnreatitis, has flares whenever he eats greasy foods and drinks alcohol. Last night he ate greasy foods, and drank 8 beers. Now having RUQ abdominal pain.

## 2017-05-04 ENCOUNTER — Other Ambulatory Visit: Payer: Self-pay | Admitting: Family Medicine

## 2017-05-04 ENCOUNTER — Encounter (HOSPITAL_COMMUNITY): Payer: Self-pay

## 2017-05-04 LAB — HIV ANTIBODY (ROUTINE TESTING W REFLEX): HIV Screen 4th Generation wRfx: NONREACTIVE

## 2017-05-04 LAB — CBC
HCT: 42.4 % (ref 39.0–52.0)
Hemoglobin: 14.1 g/dL (ref 13.0–17.0)
MCH: 31.4 pg (ref 26.0–34.0)
MCHC: 33.3 g/dL (ref 30.0–36.0)
MCV: 94.4 fL (ref 78.0–100.0)
Platelets: 238 10*3/uL (ref 150–400)
RBC: 4.49 MIL/uL (ref 4.22–5.81)
RDW: 14 % (ref 11.5–15.5)
WBC: 9.1 10*3/uL (ref 4.0–10.5)

## 2017-05-04 LAB — COMPREHENSIVE METABOLIC PANEL
ALT: 13 U/L — ABNORMAL LOW (ref 17–63)
AST: 19 U/L (ref 15–41)
Albumin: 3.5 g/dL (ref 3.5–5.0)
Alkaline Phosphatase: 47 U/L (ref 38–126)
Anion gap: 6 (ref 5–15)
BUN: 5 mg/dL — ABNORMAL LOW (ref 6–20)
CO2: 30 mmol/L (ref 22–32)
Calcium: 9 mg/dL (ref 8.9–10.3)
Chloride: 102 mmol/L (ref 101–111)
Creatinine, Ser: 0.71 mg/dL (ref 0.61–1.24)
GFR calc Af Amer: >60 mL/min (ref >60)
GFR calc non Af Amer: >60 mL/min (ref >60)
Glucose, Bld: 108 mg/dL — ABNORMAL HIGH (ref 65–99)
Potassium: 4.5 mmol/L (ref 3.5–5.1)
Sodium: 138 mmol/L (ref 135–145)
Total Bilirubin: 0.7 mg/dL (ref 0.3–1.2)
Total Protein: 6.6 g/dL (ref 6.5–8.1)

## 2017-05-04 LAB — PROTIME-INR
INR: 1.24
Prothrombin Time: 15.5 seconds — ABNORMAL HIGH (ref 11.4–15.2)

## 2017-05-04 LAB — LIPASE, BLOOD: Lipase: 171 U/L — ABNORMAL HIGH (ref 11–51)

## 2017-05-04 LAB — GLUCOSE, CAPILLARY
Glucose-Capillary: 95 mg/dL (ref 65–99)
Glucose-Capillary: 168 mg/dL — ABNORMAL HIGH (ref 65–99)
Glucose-Capillary: 172 mg/dL — ABNORMAL HIGH (ref 65–99)
Glucose-Capillary: 78 mg/dL (ref 65–99)

## 2017-05-04 MED ORDER — OXYCODONE HCL 5 MG PO TABS
5.0000 mg | ORAL_TABLET | ORAL | Status: DC | PRN
  Administered 2017-05-04 – 2017-05-05 (×4): 5 mg via ORAL
  Filled 2017-05-04 (×4): qty 1

## 2017-05-04 NOTE — Consult Note (Signed)
           [facility] CM Primary Care Navigator  05/04/2017  Timothy Martinez [birth year] [id number]    Seen patient at the bedsideto identify possible discharge needs. Patient reports having severe abdominal pain and nausea that had led to this admission.  Patientendorses Dr. Rosana Berger with [facility] as hisprimary care provider.   [facility] [address] to [facility] medications without difficulty.   Patient reportsmanaginghis own medications at home with use of "pill box" system filled weekly.  Patient reports that he was driving prior to admission, but sisters or brother will be able to providetransportation to his doctors'appointments after discharge if needed.  Patient lives alone and he is the caregiver for himself. Patient mentioned that he has a brother Konrad Dolores- who lives close by) that can assist with care needs when needed.  Anticipateddischarge plan is home according to patient.  Patient voiced understanding to call primary care provider's officewhen hereturnshomefor a post discharge follow-up within a week or sooner if needs arise.Patient letter (with PCP's contact number) was provided as hisreminder.   Discussed with patient about [facility] CM services available for health management at home but he states that he is managing his chronic illnesses at home so far without difficulty. Patient denies any further needs or concerns at this point and had expressed understanding to seek referralfrom primary care provider to [facility] care management if deemed necessaryand appropriate for services in the near future.  [facility] care management information was provided for future needs that may arise.  Patient reports that he is quitting to smoke and drink alcohol.  Patient had opted and verbally agreed only forEMMICalls tofollow up with hisrecovery at home.  Referral made forEMMI General Calls to follow-up patient  after discharge.  For questions, please contact:  Dannielle Huh, BSN, RN- [facility] Primary Care Navigator  Telephone: [phone] [place]

## 2017-05-04 NOTE — Progress Notes (Signed)
Family Medicine Teaching Service Daily Progress Note Intern Pager: [phone]  Patient name: Timothy Martinez Medical record number: [id number] Date of birth: 18-May-1951 Age: 66 y.o. Gender: male  Primary Care Provider: Eloise Levels, MD Consultants:  Code Status: full  Pt Overview and Major Events to Date:  Timothy Martinez is a 66 y.o. male presenting with abdominal pain. PMH is significant for alcholic pancreatitis, afib, copd, traumatic splenectomy and multiple back surgeries  Assessment and Plan: Timothy Martinez is a 66 y.o. male presenting with abdominal pain. PMH is significant for alcholic pancreatitis, afib, copd, traumatic splenectomy and multiple back surgeries  Acute pancreatitis- Patient with global tenderness to palpation. CT severity index 1-2 which project is to estimated mortality rate of 1-3 an estimated complication rate of 2-5%. Lipase 171 on 10/11 down from 718 on admission -Admit to med-surg, Dr. Ardelia Mems -transition to oral roxy 5mg  q4prn -ADAT -zofran for nausea -Add LDH  Afib/flutter: [place] vasc score 2 (age and diabetes), 3 if hypertension -home metoprolol -home xarelto -PT-INR  DM-2: On metformin at home. Last A1c 5.9 on 03/17/2017. -SSI-thin -CBG before meals and at bedtime  COPD-stable -home albuterol  Alcohol use- states first time drinking in ~yr was 10/9.  CIWAs zero since admission -ethanol level pending -lactate dehydrogenase pending -CIWA  Lung nodule-86mm on CT -followup in 6-12 and then at 18-24 months non-contrast chest CT, noted in discharge summary  FEN/GI: NPO/zofran  Prophylaxis: home xarelto (pt-inr ordered)  Disposition: admit to med-surg  Subjective:  Patient feels improved, thinks we can titrate pain medication slightly.   Would also like to advance diet.   He expresses that he has learned his body will not tolerate the alcohol and he has resources to pursue outpatient should he ever feel like drinking again.  He has  not had a bowel movement but has been passing gas  Objective: Temp:  [97.5 F (36.4 C)-98 F (36.7 C)] 98 F (36.7 C) (10/11 0451) Pulse Rate:  [59-93] 66 (10/11 0451) Resp:  [10-25] 18 (10/11 0451) BP: (107-149)/(47-91) 133/78 (10/11 0451) SpO2:  [88 %-100 %] 95 % (10/11 0451) Weight:  [174 lb (78.9 kg)] 174 lb (78.9 kg) (10/11 0451) Physical Exam: General: uncomfortable but alert and non-toxic appearing. Eyes: no deficits noted or reported Cardiovascular: RRR, no murmurs noted, no edema in legs bilaterally Respiratory:  no wheezing on ausculation, course lungs sounds but consisted air movement at all locations Gastrointestinal: bowel sounds throughout, significant tenderness more focalized to left side. Global tenderness to palpation. No rebound tenderness. No Murphy sign. MSK: trace edema in legs bilaterally, no gross deficits noted Derm: long healed incision along diaphragm that patient states is from splenectomy Neuro: no gross deficits noted Psych: pleasant/coherent  Laboratory:  Recent Labs Lab 05/03/17 0753  WBC 9.5  HGB 15.8  HCT 47.6  PLT 239    Recent Labs Lab 05/03/17 0753 05/03/17 1906  NA 139 138  K 5.4* 5.0  CL 99* 104  CO2 30 25  BUN 5* 5*  CREATININE 0.80 0.74  CALCIUM 9.6 9.2  PROT 7.4  --   BILITOT 0.6  --   ALKPHOS 50  --   ALT 16*  --   AST 25  --   GLUCOSE 144* 91      Imaging/Diagnostic Tests: Ct Abdomen Pelvis W Contrast  Result Date: 05/03/2017 CLINICAL DATA:  Acute epigastric abdominal pain. EXAM: CT ABDOMEN AND PELVIS WITH CONTRAST TECHNIQUE: Multidetector CT imaging of the abdomen and pelvis was  performed using the standard protocol following bolus administration of intravenous contrast. CONTRAST:  157mL ISOVUE-300 IOPAMIDOL (ISOVUE-300) INJECTION 61% COMPARISON:  CT scan of August 04, 2014. FINDINGS: Lower chest: 6 mm right middle lobe nodule is noted. No other acute abnormality seen in the visualized lung bases. Hepatobiliary:  No focal liver abnormality is seen. No gallstones, gallbladder wall thickening, or biliary dilatation. Pancreas: There is a large amount of fluid seen around the pancreatic head concerning for acute pancreatitis. Stable dilatation of pancreatic duct is noted. Intramural pseudocyst noted on prior exam is not definitively identified on current exam. Spleen: Status post splenectomy. Adrenals/Urinary Tract: Adrenal glands are unremarkable. Kidneys are normal, without renal calculi, focal lesion, or hydronephrosis. Bladder is unremarkable. Stomach/Bowel: There is no evidence of bowel obstruction. The stomach appears normal. The appendix appears normal. Vascular/Lymphatic: Aortic atherosclerosis. No enlarged abdominal or pelvic lymph nodes. Reproductive: Prostate is unremarkable. Other: No abdominal wall hernia or abnormality. No abdominopelvic ascites. Musculoskeletal: No acute or significant osseous findings. IMPRESSION: Large amount of inflammatory changes are noted around pancreatic head and second portion of duodenum most consistent with acute pancreatitis, or less likely severe peptic ulcer disease. Laboratory and clinical correlation is recommended. Stable dilatation of pancreatic duct is noted. 6 mm right middle lobe nodule is noted. Non-contrast chest CT at 6-12 months is recommended. If the nodule is stable at time of repeat CT, then future CT at 18-24 months (from today's scan) is considered optional for low-risk patients, but is recommended for high-risk patients. This recommendation follows the consensus statement: Guidelines for Management of Incidental Pulmonary Nodules Detected on CT Images: From the Fleischner Society 2017; Radiology 2017; 284:228-243. Electronically Signed   By: Marijo Conception, M.D.   On: 05/03/2017 12:22     Sherene Sires, DO 05/04/2017, 6:38 AM PGY-1, [place] Intern pager: [phone], text pages welcome

## 2017-05-05 ENCOUNTER — Encounter (HOSPITAL_COMMUNITY): Payer: Self-pay | Admitting: Family Medicine

## 2017-05-05 DIAGNOSIS — I4821 Permanent atrial fibrillation: Secondary | ICD-10-CM

## 2017-05-05 DIAGNOSIS — J449 Chronic obstructive pulmonary disease, unspecified: Secondary | ICD-10-CM

## 2017-05-05 DIAGNOSIS — K219 Gastro-esophageal reflux disease without esophagitis: Secondary | ICD-10-CM

## 2017-05-05 DIAGNOSIS — I482 Chronic atrial fibrillation: Secondary | ICD-10-CM

## 2017-05-05 DIAGNOSIS — E119 Type 2 diabetes mellitus without complications: Secondary | ICD-10-CM

## 2017-05-05 DIAGNOSIS — Z7901 Long term (current) use of anticoagulants: Secondary | ICD-10-CM

## 2017-05-05 DIAGNOSIS — F1011 Alcohol abuse, in remission: Secondary | ICD-10-CM

## 2017-05-05 HISTORY — DX: Permanent atrial fibrillation: I48.21

## 2017-05-05 LAB — CBC
HCT: 44.9 % (ref 39.0–52.0)
Hemoglobin: 14.9 g/dL (ref 13.0–17.0)
MCH: 31.2 pg (ref 26.0–34.0)
MCHC: 33.2 g/dL (ref 30.0–36.0)
MCV: 93.9 fL (ref 78.0–100.0)
Platelets: 244 10*3/uL (ref 150–400)
RBC: 4.78 MIL/uL (ref 4.22–5.81)
RDW: 14.2 % (ref 11.5–15.5)
WBC: 12.4 10*3/uL — ABNORMAL HIGH (ref 4.0–10.5)

## 2017-05-05 LAB — GLUCOSE, CAPILLARY
Glucose-Capillary: 130 mg/dL — ABNORMAL HIGH (ref 65–99)
Glucose-Capillary: 90 mg/dL (ref 65–99)

## 2017-05-05 LAB — COMPREHENSIVE METABOLIC PANEL
ALT: 14 U/L — ABNORMAL LOW (ref 17–63)
AST: 20 U/L (ref 15–41)
Albumin: 3.8 g/dL (ref 3.5–5.0)
Alkaline Phosphatase: 50 U/L (ref 38–126)
Anion gap: 11 (ref 5–15)
BUN: 5 mg/dL — ABNORMAL LOW (ref 6–20)
CO2: 23 mmol/L (ref 22–32)
Calcium: 9.4 mg/dL (ref 8.9–10.3)
Chloride: 102 mmol/L (ref 101–111)
Creatinine, Ser: 0.81 mg/dL (ref 0.61–1.24)
GFR calc Af Amer: >60 mL/min (ref >60)
GFR calc non Af Amer: >60 mL/min (ref >60)
Glucose, Bld: 142 mg/dL — ABNORMAL HIGH (ref 65–99)
Potassium: 4.2 mmol/L (ref 3.5–5.1)
Sodium: 136 mmol/L (ref 135–145)
Total Bilirubin: 1 mg/dL (ref 0.3–1.2)
Total Protein: 7.5 g/dL (ref 6.5–8.1)

## 2017-05-05 MED ORDER — ADULT MULTIVITAMIN W/MINERALS CH: 1.0000 | ORAL_TABLET | Freq: Every day | ORAL | 0 refills | Status: AC

## 2017-05-05 MED ORDER — NICOTINE 7 MG/24HR TD PT24: 7.0000 mg | MEDICATED_PATCH | Freq: Every day | TRANSDERMAL | 0 refills | Status: DC

## 2017-05-05 MED ORDER — OXYCODONE HCL 5 MG PO TABS: 5.0000 mg | ORAL_TABLET | Freq: Four times a day (QID) | ORAL | 0 refills | Status: DC | PRN

## 2017-05-05 NOTE — Progress Notes (Signed)
Family Medicine Teaching Service Daily Progress Note Intern Pager: [phone]  Patient name: Timothy Martinez Medical record number: [id number] Date of birth: 1951/02/06 Age: 66 y.o. Gender: male  Primary Care Provider: Eloise Levels, MD Consultants:  Code Status: full  Pt Overview and Major Events to Date:  Timothy Martinez is a 66 y.o. male presenting with abdominal pain. PMH is significant for alcholic pancreatitis, afib, copd, traumatic splenectomy and multiple back surgeries  Assessment and Plan: Timothy Martinez is a 66 y.o. male presenting with abdominal pain. PMH is significant for alcholic pancreatitis, afib, copd, traumatic splenectomy and multiple back surgeries  Acute pancreatitis- Patient with global tenderness to palpation. CT severity index 1-2 which project is to estimated mortality rate of 1-3 an estimated complication rate of 8-6%. Lipase 171 on 10/11 down from 718 on admission -Admit to med-surg, Dr. Ardelia Mems -transition to oral roxy 5mg  q4prn -ADAT -zofran for nausea -Add LDH  Afib/flutter: [place] vasc score 2 (age and diabetes), 3 if hypertension -home metoprolol -home xarelto -PT-INR  DM-2: On metformin at home. Last A1c 5.9 on 03/17/2017. -SSI-thin -CBG before meals and at bedtime  COPD-stable -home albuterol  Alcohol use- states first time drinking in ~yr was 10/9.  CIWAs zero since admission -ethanol level pending -lactate dehydrogenase pending -CIWA- zero during stay, discontinued  Lung nodule-74mm on CT -followup in 6-12 and then at 18-24 months non-contrast chest CT, noted in discharge summary -discussed with patient so he is aware -he refused nicotene patch prescription because he said he could not afford it, will suggest that his ciggarette money might pay for patches  FEN/GI: NPO/zofran  Prophylaxis: home xarelto (pt-inr ordered)  Disposition: expect d/c today to home  Subjective:  Feeling well, tolerating diet, not wanting much pain  medication and will followup outpatient  Objective: Temp:  [97.3 F (36.3 C)-98.3 F (36.8 C)] 97.3 F (36.3 C) (10/12 0510) Pulse Rate:  [62-66] 66 (10/11 2123) Resp:  [16] 16 (10/12 0510) BP: (120-130)/(67-72) 120/67 (10/12 0510) SpO2:  [96 %-97 %] 97 % (10/12 0510) Physical Exam: General: uncomfortable but alert and non-toxic appearing. Eyes: no deficits noted or reported Cardiovascular: RRR, no murmurs noted, no edema in legs bilaterally Respiratory:  no wheezing on ausculation, course lungs sounds but consisted air movement at all locations Gastrointestinal: bowel sounds throughout, minimal tenderness in belly. Global tenderness to palpation. No rebound tenderness. No Murphy sign. MSK: trace edema in legs bilaterally, no gross deficits noted Derm: long healed incision along diaphragm that patient states is from splenectomy Neuro: no gross deficits noted Psych: pleasant/coherent  Laboratory:  Recent Labs Lab 05/03/17 0753 05/04/17 0701 05/05/17 0521  WBC 9.5 9.1 12.4*  HGB 15.8 14.1 14.9  HCT 47.6 42.4 44.9  PLT 239 238 244    Recent Labs Lab 05/03/17 0753 05/03/17 1906 05/04/17 0701  NA 139 138 138  K 5.4* 5.0 4.5  CL 99* 104 102  CO2 30 25 30   BUN 5* 5* 5*  CREATININE 0.80 0.74 0.71  CALCIUM 9.6 9.2 9.0  PROT 7.4  --  6.6  BILITOT 0.6  --  0.7  ALKPHOS 50  --  47  ALT 16*  --  13*  AST 25  --  19  GLUCOSE 144* 91 108*      Imaging/Diagnostic Tests: Ct Abdomen Pelvis W Contrast  Result Date: 05/03/2017 CLINICAL DATA:  Acute epigastric abdominal pain. EXAM: CT ABDOMEN AND PELVIS WITH CONTRAST TECHNIQUE: Multidetector CT imaging of the abdomen and pelvis  was performed using the standard protocol following bolus administration of intravenous contrast. CONTRAST:  181mL ISOVUE-300 IOPAMIDOL (ISOVUE-300) INJECTION 61% COMPARISON:  CT scan of August 04, 2014. FINDINGS: Lower chest: 6 mm right middle lobe nodule is noted. No other acute abnormality seen in  the visualized lung bases. Hepatobiliary: No focal liver abnormality is seen. No gallstones, gallbladder wall thickening, or biliary dilatation. Pancreas: There is a large amount of fluid seen around the pancreatic head concerning for acute pancreatitis. Stable dilatation of pancreatic duct is noted. Intramural pseudocyst noted on prior exam is not definitively identified on current exam. Spleen: Status post splenectomy. Adrenals/Urinary Tract: Adrenal glands are unremarkable. Kidneys are normal, without renal calculi, focal lesion, or hydronephrosis. Bladder is unremarkable. Stomach/Bowel: There is no evidence of bowel obstruction. The stomach appears normal. The appendix appears normal. Vascular/Lymphatic: Aortic atherosclerosis. No enlarged abdominal or pelvic lymph nodes. Reproductive: Prostate is unremarkable. Other: No abdominal wall hernia or abnormality. No abdominopelvic ascites. Musculoskeletal: No acute or significant osseous findings. IMPRESSION: Large amount of inflammatory changes are noted around pancreatic head and second portion of duodenum most consistent with acute pancreatitis, or less likely severe peptic ulcer disease. Laboratory and clinical correlation is recommended. Stable dilatation of pancreatic duct is noted. 6 mm right middle lobe nodule is noted. Non-contrast chest CT at 6-12 months is recommended. If the nodule is stable at time of repeat CT, then future CT at 18-24 months (from today's scan) is considered optional for low-risk patients, but is recommended for high-risk patients. This recommendation follows the consensus statement: Guidelines for Management of Incidental Pulmonary Nodules Detected on CT Images: From the Fleischner Society 2017; Radiology 2017; 284:228-243. Electronically Signed   By: Marijo Conception, M.D.   On: 05/03/2017 12:22     Sherene Sires, DO 05/05/2017, 6:26 AM PGY-1, [place] Intern pager: [phone], text pages welcome

## 2017-05-05 NOTE — Progress Notes (Signed)
Pt discharged home in stable condition after going over discharge instructions with no concerns expressed. AVS and discharge script given before leaving unit

## 2017-05-05 NOTE — Discharge Instructions (Signed)

## 2017-05-09 ENCOUNTER — Other Ambulatory Visit: Payer: Self-pay | Admitting: Family Medicine

## 2017-05-12 ENCOUNTER — Ambulatory Visit (INDEPENDENT_AMBULATORY_CARE_PROVIDER_SITE_OTHER): Payer: Medicare Other | Admitting: Family Medicine

## 2017-05-12 ENCOUNTER — Encounter: Payer: Self-pay | Admitting: Family Medicine

## 2017-05-12 DIAGNOSIS — K860 Alcohol-induced chronic pancreatitis: Secondary | ICD-10-CM

## 2017-05-12 DIAGNOSIS — Z23 Encounter for immunization: Secondary | ICD-10-CM | POA: Diagnosis not present

## 2017-05-12 DIAGNOSIS — Z720 Tobacco use: Secondary | ICD-10-CM | POA: Diagnosis not present

## 2017-05-12 DIAGNOSIS — F1011 Alcohol abuse, in remission: Secondary | ICD-10-CM | POA: Diagnosis not present

## 2017-05-12 DIAGNOSIS — R911 Solitary pulmonary nodule: Secondary | ICD-10-CM | POA: Diagnosis not present

## 2017-05-12 MED ORDER — FAMOTIDINE 40 MG PO TABS: 40.0000 mg | ORAL_TABLET | ORAL | 3 refills | Status: DC

## 2017-05-12 NOTE — Assessment & Plan Note (Signed)
Discharged one week ago after acute on chronic pancreatitis secondary to alcohol abuse. Denies current symptoms and has no red flags. Plans to abstain from alcohol and would like to stop smoking as well.  Discussed risk factors for developing being acute pancreatitis and patient was able to teach back. Requested that he follows up with me in 3 months to see how he is doing with his smoking and alcohol use.

## 2017-05-12 NOTE — Progress Notes (Signed)
    Subjective:  Timothy Martinez is a 66 y.o. male who presents to the [facility] today for hospital follow-up for idiopathic acute on chronic pancreatitis.   HPI  KADAN MILLSTEIN presents for hospital follow up. Patient was hospitalized from 05/03/2017 to 05/05/2017 with abdominal pain and had a significantly elevated lipase andCT confirming pancreatitis.  Patient had diet advanced and pain was well controlled during admission. Additionally he had a 6 mm lung nodule incidentally noted on CT. There is recommendation that he receives a repeat noncontrast CT at 6-12 months and then a future CT at 18-24 months from the initial scan. Additionally he wants to discuss tobacco cessation. Currently he denies any abdominal pain, fevers, chills, nausea, vomiting or diarrhea. His last alcoholic drink was on 24/0/97 when he had 6-12 beers and got admitted for pancreatitis. Continues to smoke cigarettes one ppd. Wants to quit and try the patch.    PMH: alcohol abuse, tobacco abuse, chronic pancreatitis, COPD Tobacco use: current smoker reports 20 cigarettes per day Medication: reviewed and updated ROS: see HPI   Objective:  Physical Exam: BP 128/64   Pulse 64   Temp 97.6 F (36.4 C) (Oral)   Ht 6\' 3"  (1.905 m)   Wt 178 lb 3.2 oz (80.8 kg)   SpO2 98%   BMI 22.27 kg/m   Gen: 66 year old male inNAD, resting comfortably CV: RRR with no murmurs appreciated Pulm: NWOB, CTAB with no crackles, wheezes, or rhonchi GI: Normal bowel sounds present. Soft, Nontender, Nondistended. MSK: no edema, cyanosis, or clubbing noted Skin: warm, dry Neuro: grossly normal, moves all extremities Psych: Normal affect and thought content  No results found for this or any previous visit (from the past 72 hour(s)).   Assessment/Plan:  Chronic alcoholic pancreatitis ([place]) Discharged one week ago after acute on chronic pancreatitis secondary to alcohol abuse. Denies current symptoms and has no red flags. Plans to abstain from  alcohol and would like to stop smoking as well.  Discussed risk factors for developing being acute pancreatitis and patient was able to teach back. Requested that he follows up with me in 3 months to see how he is doing with his smoking and alcohol use.  Tobacco abuse Current smoker of 20 cigarettes per day. Requesting medication to try to quit. Discussed all options available to patient and he is interested in trying the patch. He is able to order nicotine patches for free through a catalog from his insurance. I recommended that he order all of the available doses from 21mg  down to the lowest dose so that he is able to titrate down. We'll follow-up with him in one month to assess his progress. Encouraged him to call the office if he has any concerns or questions.  Solitary pulmonary nodule Long term smoker and current smoker of 1ppd. Incidentally noticed 92mm right middle lobe nodule on CT.  Recommendation is for patient to receive repeat non-con CT chest 6-12 months and then repeat in 18-24 months from initial scan. Patient planning to quit smoking as noted above.   Healthcare maintenance Received flu-shot today. Declined PNA shot, but says that he will get it at his next visit in a month. Recently sent in fecal cards in and we should be receiving results soon. Will send in order for TDAP to pharmacy at next visit.   Mak Bonny L. Rosalyn Gess, [place] Resident PGY-2 05/12/2017 1:05 PM

## 2017-05-12 NOTE — Assessment & Plan Note (Signed)
Long term smoker and current smoker of 1ppd. Incidentally noticed 28mm right middle lobe nodule on CT.  Recommendation is for patient to receive repeat non-con CT chest 6-12 months and then repeat in 18-24 months from initial scan. Patient planning to quit smoking as noted above.

## 2017-05-12 NOTE — Patient Instructions (Addendum)
Timothy Martinez, you were seen today for a hospital follow up.  I am glad that you are feeling better today.  I am also glad to hear that you want to quit smoking.  Definitely order the patches through your magazine. I would recommend ordering all of the doses 21mg , 14 and then 7. You need to follow the directions to be able to taper the doses properly.  If you have any questions about how to do this or if the directions are confusing call me anytime and we can chat.   Please follow up in one month and we can see how you're doing and you can get your pneumonia shot at that time.   Great to see you.  [name] L. Rosalyn Gess, [place] Resident PGY-2 05/12/2017 9:57 AM

## 2017-05-12 NOTE — Assessment & Plan Note (Addendum)
Current smoker of 20 cigarettes per day. Requesting medication to try to quit. Discussed all options available to patient and he is interested in trying the patch. He is able to order nicotine patches for free through a catalog from his insurance. I recommended that he order all of the available doses from 21mg  down to the lowest dose so that he is able to titrate down. We'll follow-up with him in one month to assess his progress. Encouraged him to call the office if he has any concerns or questions.

## 2017-05-17 ENCOUNTER — Other Ambulatory Visit: Payer: Self-pay | Admitting: *Deleted

## 2017-05-17 ENCOUNTER — Telehealth: Payer: Self-pay

## 2017-05-17 MED ORDER — FAMOTIDINE 40 MG PO TABS: 40.0000 mg | ORAL_TABLET | ORAL | 3 refills | Status: DC

## 2017-05-17 NOTE — Telephone Encounter (Signed)
Patient called in and said that his acid reflux med was called into the wrong pharmacy. I have deleted the old pharmacy from his records.. Please call the  script into the [facility] on [address].Timothy Martinez

## 2017-05-19 ENCOUNTER — Telehealth: Payer: Self-pay | Admitting: Cardiovascular Disease

## 2017-05-19 ENCOUNTER — Other Ambulatory Visit: Payer: Self-pay | Admitting: Family Medicine

## 2017-05-19 MED ORDER — FAMOTIDINE 40 MG PO TABS
40.0000 mg | ORAL_TABLET | ORAL | 3 refills | Status: DC
Start: 2017-05-19 — End: 2018-03-18

## 2017-05-19 NOTE — Telephone Encounter (Signed)
Pt calling to ask if their are any contraindications with him using nicotine patches, with his cardiac meds and history.  Informed the pt that I spoke with our Pharmacist [facility], and she states that he is safe to use the nicotine patches with his meds taking, and with his history.  Pt verbalized understanding and agrees with this plan.

## 2017-05-19 NOTE — Telephone Encounter (Signed)
New Message     Pt c/o medication issue:  1. Name of Medication: nicotine patches   2. How are you currently taking this medication (dosage and times per day)?  Has not started   3. Are you having a reaction (difficulty breathing--STAT)? no  4. What is your medication issue? Are theses safe for him to take with the medication he is currently on

## 2017-05-26 ENCOUNTER — Telehealth: Payer: Self-pay | Admitting: *Deleted

## 2017-05-26 NOTE — Telephone Encounter (Signed)
Patient left message on nurse line stating he was returning call from PCP. Please call patient at [phone]. Hubbard Hartshorn, RN, BSN

## 2017-06-13 ENCOUNTER — Encounter: Payer: Self-pay | Admitting: Family Medicine

## 2017-06-13 ENCOUNTER — Ambulatory Visit (INDEPENDENT_AMBULATORY_CARE_PROVIDER_SITE_OTHER): Payer: Medicare Other | Admitting: Family Medicine

## 2017-06-13 ENCOUNTER — Other Ambulatory Visit: Payer: Self-pay

## 2017-06-13 VITALS — BP 110/80 | HR 62 | Temp 97.5°F | Ht 75.0 in | Wt 176.8 lb

## 2017-06-13 DIAGNOSIS — Z720 Tobacco use: Secondary | ICD-10-CM

## 2017-06-13 DIAGNOSIS — F17200 Nicotine dependence, unspecified, uncomplicated: Secondary | ICD-10-CM

## 2017-06-13 DIAGNOSIS — J449 Chronic obstructive pulmonary disease, unspecified: Secondary | ICD-10-CM | POA: Diagnosis not present

## 2017-06-13 MED ORDER — TETANUS-DIPHTHERIA TOXOIDS TD 2-2 LF/0.5ML IM SUSP: 0.5000 mL | Freq: Once | INTRAMUSCULAR | 0 refills | Status: AC

## 2017-06-13 MED ORDER — MOMETASONE FURO-FORMOTEROL FUM 200-5 MCG/ACT IN AERO: 2.0000 | INHALATION_SPRAY | Freq: Two times a day (BID) | RESPIRATORY_TRACT | 3 refills | Status: DC

## 2017-06-13 NOTE — Assessment & Plan Note (Signed)
Patient is currently trying to quit smoking and is on the patch.  He is down to 10 cigarettes weekly from 3 packs in the past.  He reports that it is difficult but he wants to continue trying.  I have encouraged him to call me in 1 month when he finishes his course and we can discuss the next steps.

## 2017-06-13 NOTE — Progress Notes (Signed)
    Subjective:  Timothy Martinez is a 66 y.o. male who presents to the [facility] today to follow-up on his smoking cessation  HPI:  Tobacco abuse: Patient was seen last month and felt that it was time to stop smoking.  He has been smoking 3 packs daily for many years and decided that he would try the patch.  He has cut down to 10 cigarettes weekly and does not use the patch on days that he smokes.  He does been known history of COPD and uses his albuterol inhaler once nightly and was previously on Rosa, but thinks this was stopped because his insurance did not cover it.  Currently he denies any chest pain, shortness of breath, nausea, vomiting, diarrhea, changes in bowel or bladder habits  PMH: COPD, hyperlipidemia, history of alcohol abuse Tobacco use: Current smoker Medication: reviewed and updated ROS: see HPI   Objective:  Physical Exam: BP 110/80 (BP Location: Right Arm, Patient Position: Sitting, Cuff Size: Normal)   Pulse 62   Temp (!) 97.5 F (36.4 C) (Oral)   Ht 6\' 3"  (1.905 m)   Wt 176 lb 12.8 oz (80.2 kg)   SpO2 94%   BMI 22.10 kg/m   Gen: 66 year old man in NAD, resting comfortably CV: RRR with no murmurs appreciated Pulm: NWOB, CTAB with no crackles, wheezes, or rhonchi GI: Normal bowel sounds present. Soft, Nontender, Nondistended. MSK: no edema, cyanosis, or clubbing noted Skin: warm, dry Neuro: grossly normal, moves all extremities Psych: Normal affect and thought content  No results found for this or any previous visit (from the past 72 hour(s)).   Assessment/Plan:  Tobacco abuse Patient is currently trying to quit smoking and is on the patch.  He is down to 10 cigarettes weekly from 3 packs in the past.  He reports that it is difficult but he wants to continue trying.  I have encouraged him to call me in 1 month when he finishes his course and we can discuss the next steps.   COPD (chronic obstructive pulmonary disease) ([place]) Previously on Dulera and breo back  in 2016.  Does report shortness of breath with exertion at times and having to use his albuterol inhaler nightly.  I restarted him on Dulera 2 puffs 2 times daily at the strength he was previously prescribed.  If his insurance does not cover this I have asked him to call me and we can find another option.  If he is currently working on quitting smoking and I have applauded him for this.   Healthcare maintenance Printed out prescription for Tdap and recommended that he go to his local pharmacy to receive the shot.  Patient declines being interested in Prevnar and colonoscopy.  He does use home FOBT tests.  I do not have any record of these being done.  Annya Lizana L. Rosalyn Gess, [place] Medicine Resident PGY-2 06/13/2017 2:55 PM

## 2017-06-13 NOTE — Assessment & Plan Note (Signed)
Previously on Dulera and breo back in 2016.  Does report shortness of breath with exertion at times and having to use his albuterol inhaler nightly.  I restarted him on Dulera 2 puffs 2 times daily at the strength he was previously prescribed.  If his insurance does not cover this I have asked him to call me and we can find another option.  If he is currently working on quitting smoking and I have applauded him for this.

## 2017-06-13 NOTE — Patient Instructions (Addendum)
Timothy Martinez, you were seen today for for follow-up of your smoking.  I am glad that you have cut down so much, keep up the good work.  Please call me in 1 month and we can talk about the next steps.    Regarding your breathing I have refilled your Dulera inhaler for your COPD.  Please use this inhaler 2 puffs 2 times a day every day and continue to use your albuterol inhaler as needed.  I also printed you out a prescription for a tetanus shot.  You can bring this to your pharmacy and received this shot.   Take care, Aarilyn Dye L. Rosalyn Gess, [place] Medicine Resident PGY-2 06/13/2017 2:46 PM

## 2017-07-25 HISTORY — PX: COLONOSCOPY W/ POLYPECTOMY: SHX1380

## 2017-09-04 ENCOUNTER — Emergency Department (HOSPITAL_COMMUNITY): Payer: Medicare Other

## 2017-09-04 ENCOUNTER — Other Ambulatory Visit: Payer: Self-pay

## 2017-09-04 ENCOUNTER — Encounter (HOSPITAL_COMMUNITY): Payer: Self-pay

## 2017-09-04 ENCOUNTER — Emergency Department (HOSPITAL_COMMUNITY)
Admission: EM | Admit: 2017-09-04 | Discharge: 2017-09-04 | Disposition: A | Discharge status: Home / Self Care | Payer: Medicare Other | Attending: Emergency Medicine | Admitting: Emergency Medicine

## 2017-09-04 DIAGNOSIS — I509 Heart failure, unspecified: Secondary | ICD-10-CM | POA: Insufficient documentation

## 2017-09-04 DIAGNOSIS — Z7984 Long term (current) use of oral hypoglycemic drugs: Secondary | ICD-10-CM | POA: Diagnosis not present

## 2017-09-04 DIAGNOSIS — R1013 Epigastric pain: Secondary | ICD-10-CM | POA: Diagnosis present

## 2017-09-04 DIAGNOSIS — J449 Chronic obstructive pulmonary disease, unspecified: Secondary | ICD-10-CM | POA: Insufficient documentation

## 2017-09-04 DIAGNOSIS — Z79899 Other long term (current) drug therapy: Secondary | ICD-10-CM | POA: Diagnosis not present

## 2017-09-04 DIAGNOSIS — K859 Acute pancreatitis without necrosis or infection, unspecified: Secondary | ICD-10-CM | POA: Diagnosis not present

## 2017-09-04 DIAGNOSIS — I110 Hypertensive heart disease with heart failure: Secondary | ICD-10-CM | POA: Diagnosis not present

## 2017-09-04 DIAGNOSIS — K861 Other chronic pancreatitis: Secondary | ICD-10-CM | POA: Insufficient documentation

## 2017-09-04 DIAGNOSIS — F17210 Nicotine dependence, cigarettes, uncomplicated: Secondary | ICD-10-CM | POA: Diagnosis not present

## 2017-09-04 DIAGNOSIS — E119 Type 2 diabetes mellitus without complications: Secondary | ICD-10-CM | POA: Insufficient documentation

## 2017-09-04 LAB — CBC
HCT: 48.1 % (ref 39.0–52.0)
Hemoglobin: 15.8 g/dL (ref 13.0–17.0)
MCH: 31.7 pg (ref 26.0–34.0)
MCHC: 32.8 g/dL (ref 30.0–36.0)
MCV: 96.4 fL (ref 78.0–100.0)
Platelets: 257 10*3/uL (ref 150–400)
RBC: 4.99 MIL/uL (ref 4.22–5.81)
RDW: 14.8 % (ref 11.5–15.5)
WBC: 16.4 10*3/uL — ABNORMAL HIGH (ref 4.0–10.5)

## 2017-09-04 LAB — URINALYSIS, ROUTINE W REFLEX MICROSCOPIC
Bilirubin Urine: NEGATIVE
Glucose, UA: 50 mg/dL — AB
Ketones, ur: NEGATIVE mg/dL
Leukocytes, UA: NEGATIVE
Nitrite: NEGATIVE
Protein, ur: 100 mg/dL — AB
Specific Gravity, Urine: 1.023 (ref 1.005–1.030)
pH: 5 (ref 5.0–8.0)

## 2017-09-04 LAB — I-STAT CG4 LACTIC ACID, ED
Lactic Acid, Venous: 2.41 mmol/L (ref 0.5–1.9)
Lactic Acid, Venous: 1.69 mmol/L (ref 0.5–1.9)

## 2017-09-04 LAB — COMPREHENSIVE METABOLIC PANEL
ALT: 19 U/L (ref 17–63)
AST: 23 U/L (ref 15–41)
Albumin: 3.6 g/dL (ref 3.5–5.0)
Alkaline Phosphatase: 68 U/L (ref 38–126)
Anion gap: 15 (ref 5–15)
BUN: 14 mg/dL (ref 6–20)
CO2: 22 mmol/L (ref 22–32)
Calcium: 9.5 mg/dL (ref 8.9–10.3)
Chloride: 100 mmol/L — ABNORMAL LOW (ref 101–111)
Creatinine, Ser: 1.13 mg/dL (ref 0.61–1.24)
GFR calc Af Amer: >60 mL/min (ref >60)
GFR calc non Af Amer: >60 mL/min (ref >60)
Glucose, Bld: 128 mg/dL — ABNORMAL HIGH (ref 65–99)
Potassium: 4.3 mmol/L (ref 3.5–5.1)
Sodium: 137 mmol/L (ref 135–145)
Total Bilirubin: 0.9 mg/dL (ref 0.3–1.2)
Total Protein: 7.9 g/dL (ref 6.5–8.1)

## 2017-09-04 LAB — LIPASE, BLOOD: Lipase: 189 U/L — ABNORMAL HIGH (ref 11–51)

## 2017-09-04 MED ORDER — ONDANSETRON HCL 4 MG/2ML IJ SOLN
4.0000 mg | Freq: Once | INTRAMUSCULAR | Status: AC
  Administered 2017-09-04: 4 mg via INTRAVENOUS
  Filled 2017-09-04: qty 2

## 2017-09-04 MED ORDER — ONDANSETRON HCL 4 MG PO TABS: 4.0000 mg | ORAL_TABLET | Freq: Three times a day (TID) | ORAL | 0 refills | Status: DC | PRN

## 2017-09-04 MED ORDER — OXYCODONE-ACETAMINOPHEN 7.5-325 MG PO TABS: 1.0000 | ORAL_TABLET | Freq: Three times a day (TID) | ORAL | 0 refills | Status: DC | PRN

## 2017-09-04 MED ORDER — HYDROMORPHONE HCL 1 MG/ML IJ SOLN
1.0000 mg | Freq: Once | INTRAMUSCULAR | Status: AC
  Administered 2017-09-04: 1 mg via INTRAVENOUS
  Filled 2017-09-04: qty 1

## 2017-09-04 MED ORDER — IOPAMIDOL (ISOVUE-300) INJECTION 61%
INTRAVENOUS | Status: AC
  Administered 2017-09-04: 100 mL via INTRAVENOUS
  Filled 2017-09-04: qty 100

## 2017-09-04 MED ORDER — SODIUM CHLORIDE 0.9 % IV BOLUS (SEPSIS)
500.0000 mL | Freq: Once | INTRAVENOUS | Status: AC
  Administered 2017-09-04: 500 mL via INTRAVENOUS

## 2017-09-04 NOTE — ED Triage Notes (Signed)
Patient complains of abdominal pain and back pain following fall this past Thursday while cutting wood. Patient states that he knows the fall has caused recurrent pancreatitis. No vomiting, no diarrhea. Alert and oriented

## 2017-09-04 NOTE — ED Notes (Signed)
Pt asking to speak to provider. Informed PA.

## 2017-09-04 NOTE — Discharge Instructions (Signed)
You will need to keep hydrated and start eating small meals over then next few days. You should take scheduled tylenol for your pain and you may use percocet for breakthrough pain. Be aware that there is tylenol in percocet as well and you should not take more than 4000mg  of tylenol in a 24 hours period. You will need to follow up with you primary care doctor within 2 days for re-evaluation of your symptoms. You will need to return to the ER sooner if you experience and fevers, chest pain, shortness of breath, worsening pain, inability to tolerate food/fluids, or any new or worsening symptoms.

## 2017-09-04 NOTE — ED Notes (Signed)
ED Provider at bedside. 

## 2017-09-04 NOTE — ED Notes (Signed)
EDP at bedside. PA

## 2017-09-04 NOTE — ED Provider Notes (Signed)
[facility] EMERGENCY DEPARTMENT Provider Note   CSN: [id number] Arrival date & time: 09/04/17  1231     History   Chief Complaint Chief Complaint  Patient presents with  . Abdominal Pain    HPI Timothy Martinez is a 67 y.o. male.  HPI   Pt is a 67 y/o male with a h/o alcohol induced chronic pancreatitis presents to the ED c/o abdominal pain that began yesterday morning when he was laying in bed. Initially pain was in the RLQ, and now pain is also located in epigastric region. Rates pain an 11/10. Has not taken any medication for this pain. Feels like "someone has their fist balled up" to the upper abdomen. Pain is worse when he stands up. States his pain seems consistent with his chronic pancreatitis. States he has not eaten anything in 2 days. Has had decreased intake of water as well.  Last night had sweats, but no fevers. No continued sweats/chills today. He reports nausea, but denies vomiting, diarrhea, constipation. Reports frequency with urination, but denies dysuria or hematuria. Denies testicular pain or swelling. Denies chest pain or shortness of breath.  Denies any URI symptoms as well as no cough or wheezing.  He also reports a fall 5 days ago. States he was squatting down and pulling a wood splitter with his hands.  He lost his balance landing on his right side. Denies head trauma or LOC. States he initially had some lower back pain after the fall, but it has improved since then and he no longer has any pain. Pt denies any numbness/tingling/weakness to the BLE. Denies saddle anesthesia. Denies loss of control of bowels or bladder. No urinary retention. Has been ambulatory without difficulty since the fall. No neck pain. Denies a h/o IVDU. Denies a h/o CA.  Pt states he has not had ETOH since 04/2017.   Past Medical History:  Diagnosis Date  . Acute on chronic pancreatitis ([place]) 05/17/2016  . Alcohol abuse   . Alcohol-induced acute pancreatitis   .  Alcoholic pancreatitis   . Anginal pain ([place])   . Anxiety and depression 01/20/2015   PHQ 9 = 11 (01/20/15)    . Arthritis    "eat up w/it" (05/17/2016)  . Atrial fibrillation status post cardioversion ([place]) 08/2014  . Atrial fibrillation with rapid ventricular response ([place])   . Atrial flutter, unspecified   . CHF (congestive heart failure) ([place])   . Chronic anticoagulation   . Chronic back pain   . Chronic bronchitis ([place])   . COLD (chronic obstructive lung disease) ([place])   . COPD (chronic obstructive pulmonary disease) ([place])   . Daily headache    "need eye examined" (05/17/2016)  . GERD (gastroesophageal reflux disease)   . Hematuria, microscopic 02/13/2015   Noted on UA - Recheck UA at next visit ~ 1 months   . History of atrial fibrillation 06/19/2015  . History of blood transfusion 2003   related to "spleen OR"  . Hyperlipidemia   . Hypertension   . Insomnia 02/11/2016  . Lateral epicondylitis of right elbow 03/17/2017  . Left foot pain 10/12/2013  . Low back pain 12/21/2012  . Malnutrition of moderate degree 05/18/2016  . Neck pain, bilateral posterior 10/15/2014  . Neuropathy 03/16/2015  . Pain of right upper extremity 05/30/2016  . Pancreatic pseudocyst    seen on CT scan 07/2014  . Pancreatitis 08/04/2014  . Permanent atrial fibrillation ([place]) 05/05/2017  . Pneumonia    "?a few times" (  05/17/2016)  . Polyuria 08/21/2012  . Shortness of breath    with ambulation  . Tobacco abuse 05/30/2011   Heavy smoker up to 3 ppd down to 15 cigs per day in 09/2014   . Tobacco use disorder, continuous   . Type II diabetes mellitus [facility])     Patient Active Problem List   Diagnosis Date Noted  . Solitary pulmonary nodule 05/12/2017  . Pancreatitis, alcoholic, acute 16/04/9603  . Chronic atrial fibrillation ([place])   . On continuous oral anticoagulation 06/19/2015  . COPD (chronic obstructive pulmonary disease) ([place]) 04/27/2012  . GERD (gastroesophageal reflux disease) 03/05/2012  .  Alcohol use disorder, mild, in early remission, abuse 06/09/2011  . Diabetes mellitus ([place]) 05/30/2011  . Chronic alcoholic pancreatitis ([place]) 05/30/2011  . Hyperlipidemia 05/30/2011  . Tobacco abuse 05/30/2011    Past Surgical History:  Procedure Laterality Date  . BACK SURGERY    . CARDIOVERSION N/A 09/22/2014   Procedure: CARDIOVERSION;  Surgeon: Thayer Headings, MD;  Location: [facility] ENDOSCOPY;  Service: Cardiovascular;  Laterality: N/A;  . NECK SURGERY  1974   not cervical, states had lesion on neck which was removed  . POSTERIOR LAMINECTOMY / DECOMPRESSION LUMBAR SPINE  10/2006   of L4, L5 and S1 with a 5-1 diskectomy, microdissection with the microscope/notes 10/24/2006  . SPLENECTOMY  2003       Home Medications    Prior to Admission medications   Medication Sig Start Date End Date Taking? Authorizing Provider  albuterol (PROVENTIL HFA;VENTOLIN HFA) 108 (90 Base) MCG/ACT inhaler Inhale 1-2 puffs into the lungs every 12 (twelve) hours as needed for wheezing or shortness of breath. 03/13/17  Yes Eloise Levels, MD  atorvastatin (LIPITOR) 40 MG tablet Take 1 tablet (40 mg total) by mouth daily at 6 PM. 03/17/17  Yes Eloise Levels, MD  Blood Glucose Monitoring Suppl (ONE TOUCH ULTRA 2) W/DEVICE KIT Dispense one glucometer. Diagnosis Type 2 Diabetes. E11.9 09/12/14  Yes Willeen Niece, MD  cyanocobalamin 500 MCG tablet Take 500 mcg by mouth daily.   Yes [provider]  famotidine (PEPCID) 40 MG tablet Take 1 tablet (40 mg total) by mouth every other day. 05/19/17  Yes Eloise Levels, MD  glucose blood (ONE TOUCH ULTRA TEST) test strip Use check fasting blood glucose qAM. Type 2 Diabetes E11.9 09/12/14  Yes Willeen Niece, MD  metFORMIN (GLUCOPHAGE) 1000 MG tablet Take 1 tablet (1,000 mg total) by mouth 2 (two) times daily with a meal. 03/17/17  Yes Eloise Levels, MD  mometasone-formoterol (DULERA) 200-5 MCG/ACT AERO Inhale 2 puffs into the lungs 2 (two) times daily.  06/13/17  Yes Eloise Levels, MD  Multiple Vitamin (MULTIVITAMIN) tablet Take 1 tablet by mouth daily.   Yes [provider]  nicotine (NICODERM CQ - DOSED IN MG/24 HR) 7 mg/24hr patch Place 1 patch (7 mg total) onto the skin daily. 05/06/17  Yes Bland, Scott, DO  ONE TOUCH LANCETS MISC Check fasting blood glucose qAM. Type 2 DM. E11.9 09/12/14  Yes Willeen Niece, MD  XARELTO 20 MG TABS tablet TAKE 1 TABLET (20 MG TOTAL) BY MOUTH DAILY WITH SUPPER. 03/06/17  Yes Nahser, Wonda Cheng, MD  metoprolol tartrate (LOPRESSOR) 50 MG tablet Take 1.5 tablets (75 mg total) by mouth 2 (two) times daily. 03/17/17   Eloise Levels, MD  ondansetron (ZOFRAN) 4 MG tablet Take 1 tablet (4 mg total) by mouth every 8 (eight) hours as needed for nausea or  vomiting. 09/04/17   Lavender Stanke S, PA-C  oxyCODONE (OXY IR/ROXICODONE) 5 MG immediate release tablet Take 1 tablet (5 mg total) by mouth every 6 (six) hours as needed for severe pain. Patient not taking: Reported on 09/04/2017 05/05/17   Sherene Sires, DO  oxyCODONE-acetaminophen (PERCOCET) 7.5-325 MG tablet Take 1 tablet by mouth every 8 (eight) hours as needed for severe pain. 09/04/17   Kavina Cantave S, PA-C    Family History Family History  Problem Relation Age of Onset  . Diabetes Mother   . Aneurysm Mother   . Stroke Mother   . Hypertension Mother   . Alcohol abuse Father   . Cancer Father        Lung  . Cancer Sister        Lung Cancer  . Post-traumatic stress disorder Brother   . Heart attack Neg Hx     Social History Social History   Tobacco Use  . Smoking status: Current Every Day Smoker    Packs/day: 1.00    Years: 52.00    Pack years: 52.00    Types: Cigarettes    Start date: 04/09/1964  . Smokeless tobacco: Never Used  . Tobacco comment: down from 2.5 ppd  Substance Use Topics  . Alcohol use: Yes    Comment: drinking 6 beers per week  . Drug use: No     Allergies   Prozac [fluoxetine hcl]; Morphine and related; and  Penicillins   Review of Systems Review of Systems  Constitutional: Negative for chills and fever.       Decreased po intake, sweats  HENT: Negative for congestion, ear pain, rhinorrhea, sore throat and trouble swallowing.   Eyes: Negative for pain and visual disturbance.  Respiratory: Negative for cough and shortness of breath.   Cardiovascular: Negative for chest pain and palpitations.  Gastrointestinal: Positive for abdominal pain and nausea. Negative for blood in stool, constipation, diarrhea and vomiting.  Genitourinary: Negative for dysuria, flank pain, frequency, hematuria, scrotal swelling, testicular pain and urgency.       No loss of control of bowels or bladder  Musculoskeletal: Negative for arthralgias, back pain (resvoled), neck pain and neck stiffness.  Skin: Negative for color change and rash.  Neurological: Negative for seizures, syncope, light-headedness, numbness and headaches.  All other systems reviewed and are negative.    Physical Exam Updated Vital Signs BP 124/75   Pulse 86   Temp (!) 97.5 F (36.4 C)   Resp (!) 23   SpO2 92%   Physical Exam  Constitutional: He appears well-developed and well-nourished.  HENT:  Head: Normocephalic and atraumatic.  Mouth/Throat: Oropharynx is clear and moist.  Eyes: Conjunctivae and EOM are normal. No scleral icterus.  Neck: Neck supple.  Cardiovascular: Normal rate, regular rhythm and normal heart sounds.  No murmur heard. HR 90 on monitor  Pulmonary/Chest: Effort normal and breath sounds normal. No stridor. No respiratory distress. He has no wheezes. He has no rhonchi. He has no rales.  Abdominal: Soft. Normal appearance and bowel sounds are normal. There is tenderness in the epigastric area. There is guarding and CVA tenderness (right). There is no rigidity.  Musculoskeletal: He exhibits no edema.  Neurological: He is alert.  Strength 5/5 and equal to ble. Sensation normal to ble   Skin: Skin is warm and dry.    Psychiatric: He has a normal mood and affect.  Nursing note and vitals reviewed.    ED Treatments / Results  Labs (all labs ordered  are listed, but only abnormal results are displayed) Labs Reviewed  LIPASE, BLOOD - Abnormal; Notable for the following components:      Result Value   Lipase 189 (*)    All other components within normal limits  COMPREHENSIVE METABOLIC PANEL - Abnormal; Notable for the following components:   Chloride 100 (*)    Glucose, Bld 128 (*)    All other components within normal limits  CBC - Abnormal; Notable for the following components:   WBC 16.4 (*)    All other components within normal limits  URINALYSIS, ROUTINE W REFLEX MICROSCOPIC - Abnormal; Notable for the following components:   Color, Urine AMBER (*)    APPearance CLOUDY (*)    Glucose, UA 50 (*)    Hgb urine dipstick SMALL (*)    Protein, ur 100 (*)    Bacteria, UA RARE (*)    Squamous Epithelial / LPF 0-5 (*)    All other components within normal limits  I-STAT CG4 LACTIC ACID, ED - Abnormal; Notable for the following components:   Lactic Acid, Venous 2.41 (*)    All other components within normal limits  URINE CULTURE  I-STAT CG4 LACTIC ACID, ED    EKG  EKG Interpretation None       Radiology Ct Abdomen Pelvis W Contrast  Result Date: 09/04/2017 CLINICAL DATA:  Abdominal and back pain for several days. Recurrent pancreatitis. EXAM: CT ABDOMEN AND PELVIS WITH CONTRAST TECHNIQUE: Multidetector CT imaging of the abdomen and pelvis was performed using the standard protocol following bolus administration of intravenous contrast. CONTRAST:  111m ISOVUE-300 IOPAMIDOL (ISOVUE-300) INJECTION 61% COMPARISON:  05/03/2017 FINDINGS: Lower Chest: No acute findings. Chronic bibasilar interstitial lung disease again noted. 5 mm left lower lobe pulmonary nodule is unchanged since earlier CT in 2014, consistent with benign etiology. Hepatobiliary: No hepatic masses identified. Gallbladder is  unremarkable. No evidence of biliary ductal dilatation. Pancreas: A few scattered pancreatic calcifications are again seen, consistent with chronic pancreatitis. Diffuse pancreatic ductal dilatation remains stable, and no pancreatic mass identified. Peripancreatic inflammatory changes are seen surrounding the pancreatic head and duodenum, but has decreased compared to previous study. No pancreatic pseudocysts identified. Spleen: Surgically absent. Adrenals/Urinary Tract: No masses identified. No evidence of hydronephrosis. Stomach/Bowel: No evidence of obstruction, inflammatory process or abnormal fluid collections. Normal appendix visualized. Vascular/Lymphatic: No pathologically enlarged lymph nodes. No abdominal aortic aneurysm. Aortic atherosclerosis. Reproductive:  No mass or other significant abnormality. Other:  Small amount of free fluid noted in pelvic cul-de-sac he. Musculoskeletal:  No suspicious bone lesions identified. IMPRESSION: Mild acute pancreatitis, which is decreased compared to previous study. Underlying changes of chronic pancreatitis remain stable. Small amount of free fluid in pelvis.  No pseudocysts identified. Electronically Signed   By: JEarle GellM.D.   On: 09/04/2017 18:38    Procedures Procedures (including critical care time)  Medications Ordered in ED Medications  HYDROmorphone (DILAUDID) injection 1 mg (1 mg Intravenous Given 09/04/17 1715)  sodium chloride 0.9 % bolus 500 mL (0 mLs Intravenous Stopped 09/04/17 1800)  ondansetron (ZOFRAN) injection 4 mg (4 mg Intravenous Given 09/04/17 1716)  iopamidol (ISOVUE-300) 61 % injection (100 mLs Intravenous Contrast Given 09/04/17 1810)     Initial Impression / Assessment and Plan / ED Course  I have reviewed the triage vital signs and the nursing notes.  Pertinent labs & imaging results that were available during my care of the patient were reviewed by me and considered in my medical decision making (see chart  for  details).  Discussed pt presentation and exam findings with Dr. Jeanell Sparrow, who saw the pt with myself and reviewed the workup and results. She agrees with the plan to discharge pt home with outpatient f/u and symptomatic treatment. Agrees no antibiotics are necessary at this point.    Reviewed patient in [facility] narcotic database.  He has not received a prescription for narcotics since 04/2017.  Rechecked patient.  Repeat abdominal exam reveals epigastric tenderness, however belly is soft.  He is tolerating p.o.'s and has had no episodes of vomiting. Discussed plan for discharge and patient is agreeable with this plan.  He feels that he would be able to go home and follow-up as an outpatient.  He will call for a ride home.  Rechecked pt. he is tolerating p.o. He states he does not have a legal guardian and only has a power of attorney which is his niece. He states that he cannot get a ride home yes. Discussed the risks of driving after taking narcotic pain medication, and stated that he is not allowed to drive for 24 hours.    Nursing states that patient can have a bus pass to go home. Will contact legal guardian.  Contacted pts legal guardian, Jeannetta Nap and informed her of pts visit to the ED. She states that she will come pick the patient up and take him home.   Checked patient.  He is sitting up in bed in no acute distress and is still able to tolerate p.o.  Discussed all the results and need for follow-up with his primary care doctor to follow-up on said results.  Discussed that I would like to be seen within 2 days for recheck.  Discussed that he should maintain adequate hydration and saline small meals over the next several days as he is able to tolerate.  Discussed strict return precautions for any fevers, worsening pain, persistent vomiting, or any new or worsening symptoms.  Final Clinical Impressions(s) / ED Diagnoses   Final diagnoses:  Chronic pancreatitis, unspecified  pancreatitis type [facility])   67 year old male with a history of chronic pancreatitis presenting to the ED today complaining of epigastric abdominal pain that feels consistent with pancreatitis. Pt initially in NAD, but tachycardic to 115. Also with bp of 116/82, satting at 100% on RA, and afebrile.  Gave patient small fluid bolus as well as pain medicine and nausea medicine.  Pain adequately managed in the ED with 1 mg IV Dilaudid.  Tolerating p.o.'s with no episodes of vomiting.  Repeat abdominal exam with soft abdomen and positive epigastric tenderness.  Heart rate improved to 87 after fluids.  O2 sats within normal limits for patient (history of COPD), blood pressure 124/75.  Urinalysis with mild glucosuria, hematuria, proteinuria, 6-30 red blood cells and white blood cells, bacteria, 0-5 squamous epithelial cells.  Urine culture sent.  Lipase elevated to 189, however appears grossly stable as compared to 4 months ago.  CMP grossly negative.  CBC with white blood cell count of 16.4 likely due to current episode of pancreatitis.  Lactic acid initially elevated to 2.41, however normalized to 1.69 after fluids and pain medication.  CT abdomen pelvis with contrast showed mild acute pancreatitis, which is decreased compared to previous study.  Underlying changes of chronic pancreatitis remained stable.  No evidence of pancreatic necrosis or pancreatic pseudocyst. Vital signs WNL here and pt afebrile. Nontoxic appearing and no signs of sepsis. Able to tolerate fluids with no episodes of vomiting. Case discussed  with Dr Jeanell Sparrow who saw the patient and agrees with plan for discharge with pain medication and outpatient f/u. Return precaution discussed with patient.   ED Discharge Orders        Ordered    oxyCODONE-acetaminophen (PERCOCET) 7.5-325 MG tablet  Every 8 hours PRN     09/04/17 2128    ondansetron (ZOFRAN) 4 MG tablet  Every 8 hours PRN     09/04/17 2128       Rodney Booze, PA-C 09/05/17 0047     Pattricia Boss, MD 09/09/17 1652

## 2017-09-06 LAB — URINE CULTURE: Culture: NO GROWTH

## 2017-09-11 ENCOUNTER — Other Ambulatory Visit: Payer: Self-pay | Admitting: Cardiovascular Disease

## 2017-10-31 ENCOUNTER — Encounter (HOSPITAL_COMMUNITY): Payer: Self-pay

## 2017-10-31 ENCOUNTER — Inpatient Hospital Stay (HOSPITAL_COMMUNITY)
Admission: EM | Admit: 2017-10-31 | Discharge: 2017-11-02 | DRG: 439 | Disposition: A | Discharge status: Home / Self Care | Payer: Medicare Other | Attending: Internal Medicine | Admitting: Internal Medicine

## 2017-10-31 ENCOUNTER — Other Ambulatory Visit: Payer: Self-pay

## 2017-10-31 ENCOUNTER — Emergency Department (HOSPITAL_COMMUNITY): Payer: Medicare Other

## 2017-10-31 DIAGNOSIS — E1140 Type 2 diabetes mellitus with diabetic neuropathy, unspecified: Secondary | ICD-10-CM | POA: Diagnosis present

## 2017-10-31 DIAGNOSIS — I482 Chronic atrial fibrillation, unspecified: Secondary | ICD-10-CM | POA: Diagnosis present

## 2017-10-31 DIAGNOSIS — E119 Type 2 diabetes mellitus without complications: Secondary | ICD-10-CM

## 2017-10-31 DIAGNOSIS — Z823 Family history of stroke: Secondary | ICD-10-CM

## 2017-10-31 DIAGNOSIS — Z833 Family history of diabetes mellitus: Secondary | ICD-10-CM

## 2017-10-31 DIAGNOSIS — K860 Alcohol-induced chronic pancreatitis: Secondary | ICD-10-CM | POA: Diagnosis present

## 2017-10-31 DIAGNOSIS — R911 Solitary pulmonary nodule: Secondary | ICD-10-CM | POA: Diagnosis present

## 2017-10-31 DIAGNOSIS — R61 Generalized hyperhidrosis: Secondary | ICD-10-CM | POA: Diagnosis not present

## 2017-10-31 DIAGNOSIS — Z818 Family history of other mental and behavioral disorders: Secondary | ICD-10-CM | POA: Diagnosis not present

## 2017-10-31 DIAGNOSIS — F17210 Nicotine dependence, cigarettes, uncomplicated: Secondary | ICD-10-CM | POA: Diagnosis present

## 2017-10-31 DIAGNOSIS — K8689 Other specified diseases of pancreas: Secondary | ICD-10-CM | POA: Diagnosis present

## 2017-10-31 DIAGNOSIS — J449 Chronic obstructive pulmonary disease, unspecified: Secondary | ICD-10-CM | POA: Diagnosis present

## 2017-10-31 DIAGNOSIS — E785 Hyperlipidemia, unspecified: Secondary | ICD-10-CM | POA: Diagnosis present

## 2017-10-31 DIAGNOSIS — I4892 Unspecified atrial flutter: Secondary | ICD-10-CM | POA: Diagnosis present

## 2017-10-31 DIAGNOSIS — F1011 Alcohol abuse, in remission: Secondary | ICD-10-CM | POA: Diagnosis not present

## 2017-10-31 DIAGNOSIS — Z8249 Family history of ischemic heart disease and other diseases of the circulatory system: Secondary | ICD-10-CM

## 2017-10-31 DIAGNOSIS — Z888 Allergy status to other drugs, medicaments and biological substances status: Secondary | ICD-10-CM

## 2017-10-31 DIAGNOSIS — Z801 Family history of malignant neoplasm of trachea, bronchus and lung: Secondary | ICD-10-CM

## 2017-10-31 DIAGNOSIS — I5032 Chronic diastolic (congestive) heart failure: Secondary | ICD-10-CM | POA: Diagnosis present

## 2017-10-31 DIAGNOSIS — K852 Alcohol induced acute pancreatitis without necrosis or infection: Secondary | ICD-10-CM | POA: Diagnosis not present

## 2017-10-31 DIAGNOSIS — J439 Emphysema, unspecified: Secondary | ICD-10-CM | POA: Diagnosis present

## 2017-10-31 DIAGNOSIS — K219 Gastro-esophageal reflux disease without esophagitis: Secondary | ICD-10-CM | POA: Diagnosis present

## 2017-10-31 DIAGNOSIS — I480 Paroxysmal atrial fibrillation: Secondary | ICD-10-CM | POA: Diagnosis present

## 2017-10-31 DIAGNOSIS — Z7951 Long term (current) use of inhaled steroids: Secondary | ICD-10-CM | POA: Diagnosis not present

## 2017-10-31 DIAGNOSIS — K861 Other chronic pancreatitis: Secondary | ICD-10-CM | POA: Diagnosis not present

## 2017-10-31 DIAGNOSIS — Z885 Allergy status to narcotic agent status: Secondary | ICD-10-CM

## 2017-10-31 DIAGNOSIS — E7841 Elevated Lipoprotein(a): Secondary | ICD-10-CM

## 2017-10-31 DIAGNOSIS — Z811 Family history of alcohol abuse and dependence: Secondary | ICD-10-CM | POA: Diagnosis not present

## 2017-10-31 DIAGNOSIS — F1021 Alcohol dependence, in remission: Secondary | ICD-10-CM | POA: Diagnosis present

## 2017-10-31 DIAGNOSIS — Z7901 Long term (current) use of anticoagulants: Secondary | ICD-10-CM

## 2017-10-31 DIAGNOSIS — Z880 Allergy status to penicillin: Secondary | ICD-10-CM

## 2017-10-31 DIAGNOSIS — Z7984 Long term (current) use of oral hypoglycemic drugs: Secondary | ICD-10-CM | POA: Diagnosis not present

## 2017-10-31 DIAGNOSIS — Z79899 Other long term (current) drug therapy: Secondary | ICD-10-CM

## 2017-10-31 DIAGNOSIS — Z9081 Acquired absence of spleen: Secondary | ICD-10-CM | POA: Diagnosis not present

## 2017-10-31 DIAGNOSIS — K859 Acute pancreatitis without necrosis or infection, unspecified: Secondary | ICD-10-CM | POA: Diagnosis present

## 2017-10-31 DIAGNOSIS — R110 Nausea: Secondary | ICD-10-CM | POA: Diagnosis not present

## 2017-10-31 DIAGNOSIS — I110 Hypertensive heart disease with heart failure: Secondary | ICD-10-CM | POA: Diagnosis present

## 2017-10-31 DIAGNOSIS — R932 Abnormal findings on diagnostic imaging of liver and biliary tract: Secondary | ICD-10-CM | POA: Diagnosis not present

## 2017-10-31 DIAGNOSIS — R1013 Epigastric pain: Secondary | ICD-10-CM | POA: Diagnosis present

## 2017-10-31 LAB — URINALYSIS, ROUTINE W REFLEX MICROSCOPIC
Bilirubin Urine: NEGATIVE
Glucose, UA: NEGATIVE mg/dL
Ketones, ur: NEGATIVE mg/dL
Leukocytes, UA: NEGATIVE
Nitrite: NEGATIVE
Protein, ur: NEGATIVE mg/dL
Specific Gravity, Urine: 1.01 (ref 1.005–1.030)
pH: 7 (ref 5.0–8.0)

## 2017-10-31 LAB — COMPREHENSIVE METABOLIC PANEL
ALT: 15 U/L — ABNORMAL LOW (ref 17–63)
AST: 22 U/L (ref 15–41)
Albumin: 3.8 g/dL (ref 3.5–5.0)
Alkaline Phosphatase: 62 U/L (ref 38–126)
Anion gap: 8 (ref 5–15)
BUN: 6 mg/dL (ref 6–20)
CO2: 28 mmol/L (ref 22–32)
Calcium: 9.9 mg/dL (ref 8.9–10.3)
Chloride: 103 mmol/L (ref 101–111)
Creatinine, Ser: 0.71 mg/dL (ref 0.61–1.24)
GFR calc Af Amer: >60 mL/min (ref >60)
GFR calc non Af Amer: >60 mL/min (ref >60)
Glucose, Bld: 140 mg/dL — ABNORMAL HIGH (ref 65–99)
Potassium: 5.3 mmol/L — ABNORMAL HIGH (ref 3.5–5.1)
Sodium: 139 mmol/L (ref 135–145)
Total Bilirubin: 0.5 mg/dL (ref 0.3–1.2)
Total Protein: 7.5 g/dL (ref 6.5–8.1)

## 2017-10-31 LAB — CBC
HCT: 43.5 % (ref 39.0–52.0)
Hemoglobin: 14.3 g/dL (ref 13.0–17.0)
MCH: 31 pg (ref 26.0–34.0)
MCHC: 32.9 g/dL (ref 30.0–36.0)
MCV: 94.2 fL (ref 78.0–100.0)
Platelets: 336 10*3/uL (ref 150–400)
RBC: 4.62 MIL/uL (ref 4.22–5.81)
RDW: 14.8 % (ref 11.5–15.5)
WBC: 11 10*3/uL — ABNORMAL HIGH (ref 4.0–10.5)

## 2017-10-31 LAB — HEMOGLOBIN A1C
Hgb A1c MFr Bld: 6.1 % — ABNORMAL HIGH (ref 4.8–5.6)
Mean Plasma Glucose: 128.37 mg/dL

## 2017-10-31 LAB — POTASSIUM: Potassium: 4.8 mmol/L (ref 3.5–5.1)

## 2017-10-31 LAB — GLUCOSE, CAPILLARY: Glucose-Capillary: 79 mg/dL (ref 65–99)

## 2017-10-31 LAB — LIPASE, BLOOD: Lipase: 924 U/L — ABNORMAL HIGH (ref 11–51)

## 2017-10-31 MED ORDER — METOPROLOL TARTRATE 50 MG PO TABS
75.0000 mg | ORAL_TABLET | Freq: Two times a day (BID) | ORAL | Status: DC
  Administered 2017-10-31 – 2017-11-02 (×4): 75 mg via ORAL
  Filled 2017-10-31 (×4): qty 1

## 2017-10-31 MED ORDER — FOLIC ACID 1 MG PO TABS
1.0000 mg | ORAL_TABLET | Freq: Every day | ORAL | Status: DC
  Administered 2017-10-31 – 2017-11-02 (×3): 1 mg via ORAL
  Filled 2017-10-31 (×3): qty 1

## 2017-10-31 MED ORDER — SODIUM CHLORIDE 0.9 % IV BOLUS
1000.0000 mL | Freq: Once | INTRAVENOUS | Status: AC
  Administered 2017-10-31: 1000 mL via INTRAVENOUS

## 2017-10-31 MED ORDER — VITAMIN B-12 100 MCG PO TABS
500.0000 ug | ORAL_TABLET | Freq: Every day | ORAL | Status: DC
  Administered 2017-11-01 – 2017-11-02 (×2): 500 ug via ORAL
  Filled 2017-10-31 (×2): qty 5

## 2017-10-31 MED ORDER — BISACODYL 10 MG RE SUPP: 10.0000 mg | Freq: Every day | RECTAL | Status: DC | PRN

## 2017-10-31 MED ORDER — LORAZEPAM 2 MG/ML IJ SOLN: 1.0000 mg | Freq: Four times a day (QID) | INTRAMUSCULAR | Status: DC | PRN

## 2017-10-31 MED ORDER — ADULT MULTIVITAMIN W/MINERALS CH
1.0000 | ORAL_TABLET | Freq: Every day | ORAL | Status: DC
  Administered 2017-10-31 – 2017-11-02 (×3): 1 via ORAL
  Filled 2017-10-31 (×3): qty 1

## 2017-10-31 MED ORDER — ATORVASTATIN CALCIUM 40 MG PO TABS
40.0000 mg | ORAL_TABLET | Freq: Every day | ORAL | Status: DC
  Administered 2017-11-01: 40 mg via ORAL
  Filled 2017-10-31: qty 1

## 2017-10-31 MED ORDER — HYDROMORPHONE HCL 1 MG/ML IJ SOLN: 1.0000 mg | INTRAMUSCULAR | Status: DC | PRN

## 2017-10-31 MED ORDER — VITAMIN B-1 100 MG PO TABS
100.0000 mg | ORAL_TABLET | Freq: Every day | ORAL | Status: DC
  Administered 2017-11-01 – 2017-11-02 (×2): 100 mg via ORAL
  Filled 2017-10-31 (×2): qty 1

## 2017-10-31 MED ORDER — HYDROMORPHONE HCL 1 MG/ML IJ SOLN
1.0000 mg | Freq: Once | INTRAMUSCULAR | Status: AC
  Administered 2017-10-31: 1 mg via INTRAVENOUS
  Filled 2017-10-31: qty 1

## 2017-10-31 MED ORDER — ONDANSETRON HCL 4 MG/2ML IJ SOLN: 4.0000 mg | Freq: Four times a day (QID) | INTRAMUSCULAR | Status: DC | PRN

## 2017-10-31 MED ORDER — NICOTINE 14 MG/24HR TD PT24
14.0000 mg | MEDICATED_PATCH | Freq: Every day | TRANSDERMAL | Status: DC
  Administered 2017-10-31 – 2017-11-02 (×3): 14 mg via TRANSDERMAL
  Filled 2017-10-31 (×3): qty 1

## 2017-10-31 MED ORDER — INSULIN ASPART 100 UNIT/ML ~~LOC~~ SOLN
0.0000 [IU] | Freq: Three times a day (TID) | SUBCUTANEOUS | Status: DC
  Administered 2017-11-02: 3 [IU] via SUBCUTANEOUS

## 2017-10-31 MED ORDER — HYDROCODONE-ACETAMINOPHEN 5-325 MG PO TABS
1.0000 | ORAL_TABLET | ORAL | Status: DC | PRN
  Administered 2017-10-31 – 2017-11-01 (×4): 1 via ORAL
  Administered 2017-11-02: 2 via ORAL
  Administered 2017-11-02 (×2): 1 via ORAL
  Filled 2017-10-31 (×2): qty 1
  Filled 2017-10-31: qty 2
  Filled 2017-10-31 (×2): qty 1
  Filled 2017-10-31: qty 2
  Filled 2017-10-31: qty 1

## 2017-10-31 MED ORDER — FAMOTIDINE 20 MG PO TABS
40.0000 mg | ORAL_TABLET | ORAL | Status: DC
  Administered 2017-11-01: 40 mg via ORAL
  Filled 2017-10-31 (×2): qty 2

## 2017-10-31 MED ORDER — IOPAMIDOL (ISOVUE-300) INJECTION 61%
INTRAVENOUS | Status: AC
  Administered 2017-10-31: 100 mL
  Filled 2017-10-31: qty 100

## 2017-10-31 MED ORDER — ACETAMINOPHEN 325 MG PO TABS: 650.0000 mg | ORAL_TABLET | Freq: Four times a day (QID) | ORAL | Status: DC | PRN

## 2017-10-31 MED ORDER — SODIUM CHLORIDE 0.9 % IV SOLN
INTRAVENOUS | Status: DC
  Administered 2017-10-31 – 2017-11-02 (×5): via INTRAVENOUS

## 2017-10-31 MED ORDER — SODIUM CHLORIDE 0.9 % IV SOLN
INTRAVENOUS | Status: DC
Start: 2017-10-31 — End: 2017-10-31

## 2017-10-31 MED ORDER — RIVAROXABAN 20 MG PO TABS
20.0000 mg | ORAL_TABLET | Freq: Every day | ORAL | Status: DC
  Administered 2017-10-31 – 2017-11-01 (×2): 20 mg via ORAL
  Filled 2017-10-31 (×2): qty 1

## 2017-10-31 MED ORDER — HYDROMORPHONE HCL 1 MG/ML IJ SOLN
1.0000 mg | INTRAMUSCULAR | Status: DC | PRN
  Administered 2017-10-31 – 2017-11-02 (×4): 1 mg via INTRAVENOUS
  Filled 2017-10-31 (×4): qty 1

## 2017-10-31 MED ORDER — THIAMINE HCL 100 MG/ML IJ SOLN
100.0000 mg | Freq: Every day | INTRAMUSCULAR | Status: DC
  Administered 2017-10-31: 100 mg via INTRAVENOUS
  Filled 2017-10-31 (×2): qty 2

## 2017-10-31 MED ORDER — ONDANSETRON HCL 4 MG PO TABS
4.0000 mg | ORAL_TABLET | Freq: Four times a day (QID) | ORAL | Status: DC | PRN
  Administered 2017-11-01: 4 mg via ORAL
  Filled 2017-10-31: qty 1

## 2017-10-31 MED ORDER — SENNOSIDES-DOCUSATE SODIUM 8.6-50 MG PO TABS: 1.0000 | ORAL_TABLET | Freq: Every evening | ORAL | Status: DC | PRN

## 2017-10-31 MED ORDER — MOMETASONE FURO-FORMOTEROL FUM 200-5 MCG/ACT IN AERO
2.0000 | INHALATION_SPRAY | Freq: Two times a day (BID) | RESPIRATORY_TRACT | Status: DC
  Administered 2017-10-31 – 2017-11-02 (×4): 2 via RESPIRATORY_TRACT
  Filled 2017-10-31: qty 8.8

## 2017-10-31 MED ORDER — LORAZEPAM 1 MG PO TABS
1.0000 mg | ORAL_TABLET | Freq: Four times a day (QID) | ORAL | Status: DC | PRN
Start: 2017-10-31 — End: 2017-11-02

## 2017-10-31 MED ORDER — ACETAMINOPHEN 650 MG RE SUPP: 650.0000 mg | Freq: Four times a day (QID) | RECTAL | Status: DC | PRN

## 2017-10-31 NOTE — Progress Notes (Signed)
Received report from [place] from [facility] at this time.

## 2017-10-31 NOTE — Progress Notes (Signed)
Patient transferred from 5C09 to 5W07 via wheelchair. Patient A&O x4. Telemetry box 19 applied and second verified by [employer]. Skin check completed by this RN and Leonia Reader. Normal saline infusing at 100 mL/hr. Fall score completed and patient scored an 8 which makes him a moderate fall risk. Patient is independent. Patient currently rates his pain 2/10 after receiving IV dilaudid on 5C prior to his transfer. Patient in no distress at this time and has no concerns. Will continue to monitor and treat per MD orders.

## 2017-10-31 NOTE — H&P (Addendum)
History and Physical  [name]:[id number] DOB: 05/25/1951 DOA: 10/31/2017  Referring physician: Dr Arlean Hopping PCP: [name], DO  Outpatient Specialists: None Patient coming from: Home  Chief Complaint: Abdominal pain  HPI: Timothy Martinez is a 67 y.o. male with medical history significant for alcoholism, chronic pancreatitis, paroxysmal A. fib on Xarelto, tobacco use disorder, who presented to the ED at [facility] with complaints of severe epigastric pain.  Reports he had been alcohol free for a year however on Sunday 3 days ago he went on drinking binge to celebrate his birthday.  In the last 48 hours he has been having gradually worsening epigastric pain associated with nausea and no vomiting.  Denies any fevers or chills.  Admits to intermittent night sweats.  Denies hematemesis, hematochezia or melena.  Denies any falls or trauma.   ED Course: Upon presentation to the ED, was found to have elevated lipase level 924.  Was afebrile with no leukocytosis.  Was given IV fluids and pain medications and kept n.p.o. Admitted for acute on chronic pancreatitis. LFTs normal. Personally reviewed CT abd/pelvis w contrast done on 10/31/17-revealed peripancreatic edema, pancreatic calcification. Pancreatitic duct calculus.  Review of Systems: Review of systems as stated in the HPI. All other systems reviewed and are negative.   Past Medical History:  Diagnosis Date  . Acute on chronic pancreatitis ([place]) 05/17/2016  . Alcohol abuse   . Alcohol-induced acute pancreatitis   . Alcoholic pancreatitis   . Anginal pain ([place])   . Anxiety and depression 01/20/2015   PHQ 9 = 11 (01/20/15)    . Arthritis    "eat up w/it" (05/17/2016)  . Atrial fibrillation status post cardioversion ([place]) 08/2014  . Atrial fibrillation with rapid ventricular response ([place])   . Atrial flutter, unspecified   . CHF (congestive heart failure) ([place])   . Chronic anticoagulation   . Chronic back pain   .  Chronic bronchitis ([place])   . COLD (chronic obstructive lung disease) ([place])   . COPD (chronic obstructive pulmonary disease) ([place])   . Daily headache    "need eye examined" (05/17/2016)  . GERD (gastroesophageal reflux disease)   . Hematuria, microscopic 02/13/2015   Noted on UA - Recheck UA at next visit ~ 1 months   . History of atrial fibrillation 06/19/2015  . History of blood transfusion 2003   related to "spleen OR"  . Hyperlipidemia   . Hypertension   . Insomnia 02/11/2016  . Lateral epicondylitis of right elbow 03/17/2017  . Left foot pain 10/12/2013  . Low back pain 12/21/2012  . Malnutrition of moderate degree 05/18/2016  . Neck pain, bilateral posterior 10/15/2014  . Neuropathy 03/16/2015  . Pain of right upper extremity 05/30/2016  . Pancreatic pseudocyst    seen on CT scan 07/2014  . Pancreatitis 08/04/2014  . Permanent atrial fibrillation ([place]) 05/05/2017  . Pneumonia    "?a few times" (05/17/2016)  . Polyuria 08/21/2012  . Shortness of breath    with ambulation  . Tobacco abuse 05/30/2011   Heavy smoker up to 3 ppd down to 15 cigs per day in 09/2014   . Tobacco use disorder, continuous   . Type II diabetes mellitus ([place])    Past Surgical History:  Procedure Laterality Date  . BACK SURGERY    . CARDIOVERSION N/A 09/22/2014   Procedure: CARDIOVERSION;  Surgeon: Thayer Headings, MD;  Location: [place];  Service: Cardiovascular;  Laterality: N/A;  . [place]  not cervical, states had lesion on neck which was removed  . POSTERIOR LAMINECTOMY / DECOMPRESSION LUMBAR SPINE  10/2006   of L4, L5 and S1 with a 5-1 diskectomy, microdissection with the microscope/notes 10/24/2006  . SPLENECTOMY  2003    Social History:  reports that he has been smoking cigarettes.  He started smoking about 53 years ago. He has a 52.00 pack-year smoking history. He has never used smokeless tobacco. He reports that he drinks alcohol. He reports that he does not use drugs.   Allergies    Allergen Reactions  . Prozac [Fluoxetine Hcl] Nausea Only    Made the patient very sick to his stomach  . Morphine And Related Swelling    Pt states he can tolerate oxycodone and hydromorphone  . Penicillins Rash    Has patient had a PCN reaction causing immediate rash, facial/tongue/throat swelling, SOB or lightheadedness with hypotension: Yes Has patient had a PCN reaction causing severe rash involving mucus membranes or skin necrosis: No Has patient had a PCN reaction that required hospitalization: No Has patient had a PCN reaction occurring within the last 10 years: No If all of the above answers are "NO", then may proceed with Cephalosporin use.     Family History  Problem Relation Age of Onset  . Diabetes Mother   . Aneurysm Mother   . Stroke Mother   . Hypertension Mother   . Alcohol abuse Father   . Cancer Father        Lung  . Cancer Sister        Lung Cancer  . Post-traumatic stress disorder Brother   . Heart attack Neg Hx     Mother heart disease. Sister deceased with lung cancer.  Prior to Admission medications   Medication Sig Start Date End Date Taking? Authorizing Provider  atorvastatin (LIPITOR) 40 MG tablet Take 1 tablet (40 mg total) by mouth daily at 6 PM. 03/17/17  Yes Eloise Levels, MD  cyanocobalamin 500 MCG tablet Take 500 mcg by mouth daily.   Yes [provider]  famotidine (PEPCID) 40 MG tablet Take 1 tablet (40 mg total) by mouth every other day. 05/19/17  Yes Eloise Levels, MD  metFORMIN (GLUCOPHAGE) 1000 MG tablet Take 1 tablet (1,000 mg total) by mouth 2 (two) times daily with a meal. 03/17/17  Yes Eloise Levels, MD  metoprolol tartrate (LOPRESSOR) 50 MG tablet Take 1.5 tablets (75 mg total) by mouth 2 (two) times daily. 03/17/17  Yes Eloise Levels, MD  mometasone-formoterol (DULERA) 200-5 MCG/ACT AERO Inhale 2 puffs into the lungs 2 (two) times daily. 06/13/17  Yes Eloise Levels, MD  ondansetron (ZOFRAN) 4 MG tablet Take 1  tablet (4 mg total) by mouth every 8 (eight) hours as needed for nausea or vomiting. 09/04/17  Yes Couture, Cortni S, PA-C  XARELTO 20 MG TABS tablet TAKE 1 TABLET (20 MG TOTAL) BY MOUTH DAILY WITH SUPPER. 09/11/17  Yes Nahser, Wonda Cheng, MD  albuterol (PROVENTIL HFA;VENTOLIN HFA) 108 (90 Base) MCG/ACT inhaler Inhale 1-2 puffs into the lungs every 12 (twelve) hours as needed for wheezing or shortness of breath. Patient not taking: Reported on 10/31/2017 03/13/17   Eloise Levels, MD  Blood Glucose Monitoring Suppl (ONE TOUCH ULTRA 2) W/DEVICE KIT Dispense one glucometer. Diagnosis Type 2 Diabetes. E11.9 09/12/14   Willeen Niece, MD  glucose blood (ONE TOUCH ULTRA TEST) test strip Use check fasting blood glucose qAM. Type 2 Diabetes E11.9 09/12/14  Willeen Niece, MD  nicotine (NICODERM CQ - DOSED IN MG/24 HR) 7 mg/24hr patch Place 1 patch (7 mg total) onto the skin daily. Patient not taking: Reported on 10/31/2017 05/06/17   Sherene Sires, DO  ONE TOUCH LANCETS MISC Check fasting blood glucose qAM. Type 2 DM. E11.9 09/12/14   Willeen Niece, MD  oxyCODONE (OXY IR/ROXICODONE) 5 MG immediate release tablet Take 1 tablet (5 mg total) by mouth every 6 (six) hours as needed for severe pain. Patient not taking: Reported on 10/31/2017 05/05/17   Sherene Sires, DO  oxyCODONE-acetaminophen (PERCOCET) 7.5-325 MG tablet Take 1 tablet by mouth every 8 (eight) hours as needed for severe pain. Patient not taking: Reported on 10/31/2017 09/04/17   Couture, [employer], PA-C    Physical Exam: BP (!) 123/53   Pulse 67   Temp 97.9 F (36.6 C) (Oral)   Resp 12   SpO2 97%   General: 67 year old Caucasian male well-developed well-nourished.  In no acute distress.  Just received his pain medication.  Alert and oriented x3. Eyes: Anicteric sclera.  Pupils are round and reactive to light. ENT: Mucous membrane dry with no erythema or exudate. Neck: No JVD or thyromegaly. Cardiovascular: Regular rate and rhythm with no rubs or  gallops. Respiratory: Clear to auscultation with no wheezes or rales. Abdomen: Significant tenderness on palpation of epigastric area.  Scars noted on abdomen from previous surgery, splenectomy, in 2003 Skin: Abdominal scars as noted above Musculoskeletal: Moves all 4 extremities.  No lower extremity edema Psychiatric: Mood is appropriate for condition and setting Neurologic: Alert and oriented x3.  No focal motor deficits.          Labs on Admission:  Basic Metabolic Panel: Recent Labs  Lab 10/31/17 0734 10/31/17 1425  NA 139  --   K 5.3* 4.8  CL 103  --   CO2 28  --   GLUCOSE 140*  --   BUN 6  --   CREATININE 0.71  --   CALCIUM 9.9  --    Liver Function Tests: Recent Labs  Lab 10/31/17 0734  AST 22  ALT 15*  ALKPHOS 62  BILITOT 0.5  PROT 7.5  ALBUMIN 3.8   Recent Labs  Lab 10/31/17 0734  LIPASE 924*   No results for input(s): AMMONIA in the last 168 hours. CBC: Recent Labs  Lab 10/31/17 0734  WBC 11.0*  HGB 14.3  HCT 43.5  MCV 94.2  PLT 336   Cardiac Enzymes: No results for input(s): CKTOTAL, CKMB, CKMBINDEX, TROPONINI in the last 168 hours.  BNP (last 3 results) No results for input(s): BNP in the last 8760 hours.  ProBNP (last 3 results) No results for input(s): PROBNP in the last 8760 hours.  CBG: No results for input(s): GLUCAP in the last 168 hours.  Radiological Exams on Admission: Ct Abdomen Pelvis W Contrast  Result Date: 10/31/2017 CLINICAL DATA:  Possible pancreatitis, nausea EXAM: CT ABDOMEN AND PELVIS WITH CONTRAST TECHNIQUE: Multidetector CT imaging of the abdomen and pelvis was performed using the standard protocol following bolus administration of intravenous contrast. CONTRAST:  189m ISOVUE-300 IOPAMIDOL (ISOVUE-300) INJECTION 61% COMPARISON:  CT abdomen pelvis of 09/04/2017 FINDINGS: Lower chest: Chronic fibrotic changes again are noted at the lung bases. A small nodule at the left lung base appears stable measuring 6 mm in  diameter. The heart is within normal limits in size. No pericardial effusion is seen. Hepatobiliary: The liver enhances with no focal abnormality. No calcified gallstones are  seen. Pancreas: There has been interval worsening of the changes previously described of acute pancreatitis. There is considerable peripancreatic fluid and edema. No pseudocyst is seen. The pancreatic duct is very dilated to point of possible obstruction by a pancreatic duct calculus as on image 30, series 3. Additional pancreatic calcifications are noted near the neck of the pancreas consistent with chronic pancreatitis. Edema extends throughout the mesenteric fat planes and toward the porta hepatis adjacent to the gallbladder. Spleen: The spleen appears to have been previously resected. Adrenals/Urinary Tract: The adrenal glands are unremarkable. The kidneys enhance and there is some renovascular calcification bilaterally. No hydronephrosis is seen. On delayed images, the pelvocaliceal systems are unremarkable and the ureters are normal in caliber. The urinary bladder is moderately well distended and minimally thick walled. Stomach/Bowel: The stomach is largely decompressed and difficult to evaluate. No small bowel dilatation is seen although there may be mild edema of the fourth portion of the duodenum just below the pancreatic exudate. The colon is somewhat tortuous and elongated. The terminal ileum is unremarkable as is the appendix. Vascular/Lymphatic: The abdominal aorta is normal in caliber with significant abdominal aortic atherosclerosis noted. Slight dilatation of the distal abdominal aorta is noted with a maximum diameter of 2.1 cm. No aneurysm is seen no adenopathy is noted. Reproductive: The prostate is within upper limits of normal measuring approximately 3.3 x 4.5 cm. Other: Some inflammatory change from the pancreatitis does extend in the left upper quadrant adjacent to the splenic flexure of colon. Musculoskeletal: The lumbar  vertebrae are in normal alignment. There is degenerative disc disease at L5-S1. The SI joints appear corticated IMPRESSION: 1. Worsening in the interval of acute pancreatitis with considerable surrounding edema/exudate and markedly dilated pancreatic duct. The dilatation of the pancreatic duct may be ful to assess further. 2. There may be mild edema and slight dilatation of the fourth portion of the duodenum as result a of the adjacent pancreatic inflammation. 3. Significant abdominal aortic atherosclerosis. Electronically Signed   By: Ivar Drape M.D.   On: 10/31/2017 15:41    EKG: Independently reviewed.  With a rate of sinus rhythm 83.  Assessment/Plan Present on Admission: . Chronic alcoholic pancreatitis ([place]) . Hyperlipidemia . Alcohol use disorder, mild, in early remission, abuse . GERD (gastroesophageal reflux disease) . COPD (chronic obstructive pulmonary disease) ([place]) . Pancreatitis, alcoholic, acute . Solitary pulmonary nodule . Chronic atrial fibrillation ([place]) . Acute on chronic pancreatitis ([facility])  Active Problems:   Diabetes mellitus ([facility])   Chronic alcoholic pancreatitis ([facility])   Hyperlipidemia   Alcohol use disorder, mild, in early remission, abuse   GERD (gastroesophageal reflux disease)   COPD (chronic obstructive pulmonary disease) ([facility])   Pancreatitis, alcoholic, acute   Chronic atrial fibrillation ([facility])   Solitary pulmonary nodule   Acute on chronic pancreatitis ([facility])  Acute on chronic pancreatitis suspect multifactorial 2/2 to alcoholism vs calculus Lipase level 924 Continue n.p.o. except for sips and meds IV hydration IV pain medications Repeat lipase level in the morning IV Zofran for nausea Personally reviewed CT abd/pelvis w contrast revealed peripancreatic edema, pancreatic calcification. Pancreatitic duct calculus. If symptoms worsen, consult GI   Suspected pancreatic duct calculus Management as stated above If symptoms worsen consider consulting  GI in the morning  Paroxysmal A. Fib Rate controlled Resume Xarelto Continue metoprolol Continue remote telemetry  Type 2 diabetes No complications Insulin sliding scale On metformin at home Last A1c 5.9 on 03/17/17 Repeat A1C  Alcohol abuse Self-reported quit a year ago  and restarted. Last alcohol intake was 3 days ago Concern for possible alcohol withdrawal CI [place] protocol  Tobacco use disorder Nicotine patch Tobacco cessation counseling done at bedside  Chronic diastolic heart failure with EF of 55-60% Last 2D echo done on 12/30/2014 revealed LVEF of 55-60% with grade 1 diastolic dysfunction Strict I's and O's, daily weight Remote telemetry  Hyperlipidemia Continue statin    DVT prophylaxis: On Xarelto for paroxysmal A. fib  Code Status: Full code  Family Communication: None at bedside  Disposition Plan: Home when clinically stable  Consults called: None  Admission status: Inpatient status. Patient will require at least 2 midnights for IV fluid hydration, IV pain medications, and close monitoring.  Patient will also be kept n.p.o. during that time until can tolerates p.o. Intake.  Risk: Moderate to severe due to acute on chronic pancreatitis with potentiality for worsening, multiple commorbidities, and advanced age.    Kayleen Memos MD [facility] Pager [phone]  If 7PM-7AM, please contact night-coverage [email or web address] Password [facility]  10/31/2017, 5:48 PM

## 2017-10-31 NOTE — ED Provider Notes (Signed)
[facility] EMERGENCY DEPARTMENT Provider Note   CSN: [id number] Arrival date & time: 10/31/17  7408     History   Chief Complaint Chief Complaint  Patient presents with  . Pancreatitis    HPI KEIYON PLACK is a 67 y.o. male.  HPI   ROCKY GLADDEN is a 67 y.o. male, with a history of alcohol abuse, alcoholic pancreatitis, A. fib, CHF, COPD, HTN, and DM, presenting to the ED with epigastric pain beginning around 4AM this morning. States he thinks it's pancreatitis. Had stopped drinking October 2018 (Was drinking 3-4 beers a day). April 7 drank 10 beers.  Pain is sharp, constant, 10/10, radiating throughout the abdomen. Accompanied by nausea and diaphoresis.  He has not followed by a GI specialist. Denies vomiting, diarrhea, chest pain, shortness of breath, hematochezia/melena, urinary symptoms, or any other complaints.      Past Medical History:  Diagnosis Date  . Acute on chronic pancreatitis ([place]) 05/17/2016  . Alcohol abuse   . Alcohol-induced acute pancreatitis   . Alcoholic pancreatitis   . Anginal pain ([place])   . Anxiety and depression 01/20/2015   PHQ 9 = 11 (01/20/15)    . Arthritis    "eat up w/it" (05/17/2016)  . Atrial fibrillation status post cardioversion ([place]) 08/2014  . Atrial fibrillation with rapid ventricular response ([place])   . Atrial flutter, unspecified   . CHF (congestive heart failure) ([place])   . Chronic anticoagulation   . Chronic back pain   . Chronic bronchitis ([place])   . COLD (chronic obstructive lung disease) ([place])   . COPD (chronic obstructive pulmonary disease) ([place])   . Daily headache    "need eye examined" (05/17/2016)  . GERD (gastroesophageal reflux disease)   . Hematuria, microscopic 02/13/2015   Noted on UA - Recheck UA at next visit ~ 1 months   . History of atrial fibrillation 06/19/2015  . History of blood transfusion 2003   related to "spleen OR"  . Hyperlipidemia   . Hypertension   . Insomnia 02/11/2016  .  Lateral epicondylitis of right elbow 03/17/2017  . Left foot pain 10/12/2013  . Low back pain 12/21/2012  . Malnutrition of moderate degree 05/18/2016  . Neck pain, bilateral posterior 10/15/2014  . Neuropathy 03/16/2015  . Pain of right upper extremity 05/30/2016  . Pancreatic pseudocyst    seen on CT scan 07/2014  . Pancreatitis 08/04/2014  . Permanent atrial fibrillation ([place]) 05/05/2017  . Pneumonia    "?a few times" (05/17/2016)  . Polyuria 08/21/2012  . Shortness of breath    with ambulation  . Tobacco abuse 05/30/2011   Heavy smoker up to 3 ppd down to 15 cigs per day in 09/2014   . Tobacco use disorder, continuous   . Type II diabetes mellitus [facility])     Patient Active Problem List   Diagnosis Date Noted  . Solitary pulmonary nodule 05/12/2017  . Pancreatitis, alcoholic, acute 14/48/1856  . Chronic atrial fibrillation ([place])   . On continuous oral anticoagulation 06/19/2015  . COPD (chronic obstructive pulmonary disease) ([place]) 04/27/2012  . GERD (gastroesophageal reflux disease) 03/05/2012  . Alcohol use disorder, mild, in early remission, abuse 06/09/2011  . Diabetes mellitus ([place]) 05/30/2011  . Chronic alcoholic pancreatitis ([place]) 05/30/2011  . Hyperlipidemia 05/30/2011  . Tobacco abuse 05/30/2011    Past Surgical History:  Procedure Laterality Date  . BACK SURGERY    . CARDIOVERSION N/A 09/22/2014   Procedure: CARDIOVERSION;  Surgeon: Wonda Cheng Nahser,  MD;  Location: [facility];  Service: Cardiovascular;  Laterality: N/A;  . NECK SURGERY  1974   not cervical, states had lesion on neck which was removed  . POSTERIOR LAMINECTOMY / DECOMPRESSION LUMBAR SPINE  10/2006   of L4, L5 and S1 with a 5-1 diskectomy, microdissection with the microscope/notes 10/24/2006  . SPLENECTOMY  2003        Home Medications    Prior to Admission medications   Medication Sig Start Date End Date Taking? Authorizing Provider  albuterol (PROVENTIL HFA;VENTOLIN HFA) 108 (90 Base) MCG/ACT  inhaler Inhale 1-2 puffs into the lungs every 12 (twelve) hours as needed for wheezing or shortness of breath. 03/13/17   Eloise Levels, MD  atorvastatin (LIPITOR) 40 MG tablet Take 1 tablet (40 mg total) by mouth daily at 6 PM. 03/17/17   Eloise Levels, MD  Blood Glucose Monitoring Suppl (ONE TOUCH ULTRA 2) W/DEVICE KIT Dispense one glucometer. Diagnosis Type 2 Diabetes. E11.9 09/12/14   Willeen Niece, MD  cyanocobalamin 500 MCG tablet Take 500 mcg by mouth daily.    [provider]  famotidine (PEPCID) 40 MG tablet Take 1 tablet (40 mg total) by mouth every other day. 05/19/17   Eloise Levels, MD  glucose blood (ONE TOUCH ULTRA TEST) test strip Use check fasting blood glucose qAM. Type 2 Diabetes E11.9 09/12/14   Willeen Niece, MD  metFORMIN (GLUCOPHAGE) 1000 MG tablet Take 1 tablet (1,000 mg total) by mouth 2 (two) times daily with a meal. 03/17/17   Eloise Levels, MD  metoprolol tartrate (LOPRESSOR) 50 MG tablet Take 1.5 tablets (75 mg total) by mouth 2 (two) times daily. 03/17/17   Eloise Levels, MD  mometasone-formoterol (DULERA) 200-5 MCG/ACT AERO Inhale 2 puffs into the lungs 2 (two) times daily. 06/13/17   Eloise Levels, MD  Multiple Vitamin (MULTIVITAMIN) tablet Take 1 tablet by mouth daily.    [provider]  nicotine (NICODERM CQ - DOSED IN MG/24 HR) 7 mg/24hr patch Place 1 patch (7 mg total) onto the skin daily. 05/06/17   Sherene Sires, DO  ondansetron (ZOFRAN) 4 MG tablet Take 1 tablet (4 mg total) by mouth every 8 (eight) hours as needed for nausea or vomiting. 09/04/17   Couture, Cortni S, PA-C  ONE TOUCH LANCETS MISC Check fasting blood glucose qAM. Type 2 DM. E11.9 09/12/14   Willeen Niece, MD  oxyCODONE (OXY IR/ROXICODONE) 5 MG immediate release tablet Take 1 tablet (5 mg total) by mouth every 6 (six) hours as needed for severe pain. Patient not taking: Reported on 09/04/2017 05/05/17   Sherene Sires, DO  oxyCODONE-acetaminophen (PERCOCET) 7.5-325 MG  tablet Take 1 tablet by mouth every 8 (eight) hours as needed for severe pain. 09/04/17   Couture, Cortni S, PA-C  XARELTO 20 MG TABS tablet TAKE 1 TABLET (20 MG TOTAL) BY MOUTH DAILY WITH SUPPER. 09/11/17   Nahser, Wonda Cheng, MD    Family History Family History  Problem Relation Age of Onset  . Diabetes Mother   . Aneurysm Mother   . Stroke Mother   . Hypertension Mother   . Alcohol abuse Father   . Cancer Father        Lung  . Cancer Sister        Lung Cancer  . Post-traumatic stress disorder Brother   . Heart attack Neg Hx     Social History Social History   Tobacco Use  . Smoking status: Current Every  Day Smoker    Packs/day: 1.00    Years: 52.00    Pack years: 52.00    Types: Cigarettes    Start date: 04/09/1964  . Smokeless tobacco: Never Used  . Tobacco comment: down from 2.5 ppd  Substance Use Topics  . Alcohol use: Yes    Comment: drinking 6 beers per week  . Drug use: No     Allergies   Prozac [fluoxetine hcl]; Morphine and related; and Penicillins   Review of Systems Review of Systems  Constitutional: Positive for diaphoresis. Negative for chills and fever.  Respiratory: Negative for cough and shortness of breath.   Cardiovascular: Negative for chest pain.  Gastrointestinal: Positive for abdominal pain and nausea. Negative for blood in stool, diarrhea and vomiting.  Genitourinary: Negative for difficulty urinating, dysuria and hematuria.  Musculoskeletal: Negative for back pain.  All other systems reviewed and are negative.    Physical Exam Updated Vital Signs BP 139/72 (BP Location: Right Arm)   Pulse (!) 58   Temp 97.9 F (36.6 C) (Oral)   Resp 17   SpO2 99%   Physical Exam  Constitutional: He appears well-developed and well-nourished. No distress.  HENT:  Head: Normocephalic and atraumatic.  Eyes: Conjunctivae are normal.  Neck: Neck supple.  Cardiovascular: Normal rate, regular rhythm, normal heart sounds and intact distal pulses.    Pulmonary/Chest: Effort normal and breath sounds normal. No respiratory distress.  Abdominal: Soft. There is generalized tenderness. There is no rebound and no guarding.    Exquisitely tender in epigastrium, but tender throughout abdomen.   Musculoskeletal: He exhibits no edema.  Lymphadenopathy:    He has no cervical adenopathy.  Neurological: He is alert.  Skin: Skin is warm and dry. He is not diaphoretic.  Psychiatric: He has a normal mood and affect. His behavior is normal.  Nursing note and vitals reviewed.    ED Treatments / Results  Labs (all labs ordered are listed, but only abnormal results are displayed) Labs Reviewed  LIPASE, BLOOD - Abnormal; Notable for the following components:      Result Value   Lipase 924 (*)    All other components within normal limits  COMPREHENSIVE METABOLIC PANEL - Abnormal; Notable for the following components:   Potassium 5.3 (*)    Glucose, Bld 140 (*)    ALT 15 (*)    All other components within normal limits  CBC - Abnormal; Notable for the following components:   WBC 11.0 (*)    All other components within normal limits  URINALYSIS, ROUTINE W REFLEX MICROSCOPIC - Abnormal; Notable for the following components:   Hgb urine dipstick MODERATE (*)    Bacteria, UA RARE (*)    Squamous Epithelial / LPF 0-5 (*)    All other components within normal limits  POTASSIUM    EKG EKG Interpretation  Date/Time:  Tuesday October 31 2017 14:19:43 EDT Ventricular Rate:  64 PR Interval:    QRS Duration: 93 QT Interval:  413 QTC Calculation: 427 R Axis:   69 Text Interpretation:  Sinus rhythm Since last tracing rate faster Confirmed by Daleen Bo [phone]) on 10/31/2017 4:34:50 PM   Radiology Ct Abdomen Pelvis W Contrast  Result Date: 10/31/2017 CLINICAL DATA:  Possible pancreatitis, nausea EXAM: CT ABDOMEN AND PELVIS WITH CONTRAST TECHNIQUE: Multidetector CT imaging of the abdomen and pelvis was performed using the standard protocol  following bolus administration of intravenous contrast. CONTRAST:  159m ISOVUE-300 IOPAMIDOL (ISOVUE-300) INJECTION 61% COMPARISON:  CT abdomen pelvis  of 09/04/2017 FINDINGS: Lower chest: Chronic fibrotic changes again are noted at the lung bases. A small nodule at the left lung base appears stable measuring 6 mm in diameter. The heart is within normal limits in size. No pericardial effusion is seen. Hepatobiliary: The liver enhances with no focal abnormality. No calcified gallstones are seen. Pancreas: There has been interval worsening of the changes previously described of acute pancreatitis. There is considerable peripancreatic fluid and edema. No pseudocyst is seen. The pancreatic duct is very dilated to point of possible obstruction by a pancreatic duct calculus as on image 30, series 3. Additional pancreatic calcifications are noted near the neck of the pancreas consistent with chronic pancreatitis. Edema extends throughout the mesenteric fat planes and toward the porta hepatis adjacent to the gallbladder. Spleen: The spleen appears to have been previously resected. Adrenals/Urinary Tract: The adrenal glands are unremarkable. The kidneys enhance and there is some renovascular calcification bilaterally. No hydronephrosis is seen. On delayed images, the pelvocaliceal systems are unremarkable and the ureters are normal in caliber. The urinary bladder is moderately well distended and minimally thick walled. Stomach/Bowel: The stomach is largely decompressed and difficult to evaluate. No small bowel dilatation is seen although there may be mild edema of the fourth portion of the duodenum just below the pancreatic exudate. The colon is somewhat tortuous and elongated. The terminal ileum is unremarkable as is the appendix. Vascular/Lymphatic: The abdominal aorta is normal in caliber with significant abdominal aortic atherosclerosis noted. Slight dilatation of the distal abdominal aorta is noted with a maximum  diameter of 2.1 cm. No aneurysm is seen no adenopathy is noted. Reproductive: The prostate is within upper limits of normal measuring approximately 3.3 x 4.5 cm. Other: Some inflammatory change from the pancreatitis does extend in the left upper quadrant adjacent to the splenic flexure of colon. Musculoskeletal: The lumbar vertebrae are in normal alignment. There is degenerative disc disease at L5-S1. The SI joints appear corticated IMPRESSION: 1. Worsening in the interval of acute pancreatitis with considerable surrounding edema/exudate and markedly dilated pancreatic duct. The dilatation of the pancreatic duct may be ful to assess further. 2. There may be mild edema and slight dilatation of the fourth portion of the duodenum as result a of the adjacent pancreatic inflammation. 3. Significant abdominal aortic atherosclerosis. Electronically Signed   By: Ivar Drape M.D.   On: 10/31/2017 15:41    Procedures Procedures (including critical care time)  Medications Ordered in ED Medications  sodium chloride 0.9 % bolus 1,000 mL (0 mLs Intravenous Stopped 10/31/17 1424)  HYDROmorphone (DILAUDID) injection 1 mg (1 mg Intravenous Given 10/31/17 1317)  iopamidol (ISOVUE-300) 61 % injection (100 mLs  Contrast Given 10/31/17 1517)  HYDROmorphone (DILAUDID) injection 1 mg (1 mg Intravenous Given 10/31/17 1557)     Initial Impression / Assessment and Plan / ED Course  I have reviewed the triage vital signs and the nursing notes.  Pertinent labs & imaging results that were available during my care of the patient were reviewed by me and considered in my medical decision making (see chart for details).  Clinical Course as of Nov 01 1718  Tue Oct 31, 2017  1638 Spoke with Sharene Butters, hospitalist. Agrees to admit the patient.    [SJ]  Tool with Dr. Silverio Decamp, [facility]. States they will consult on the patient.    [SJ]    Clinical Course User Index [SJ] Ha Placeres C, PA-C   Patient presents with  epigastric pain and nausea.  Patient history and presentation gives concern for acute pancreatitis.  Narcotic analgesics and IV fluids.  CT gives evidence for widespread pancreatitis without noted abscess or necrosis. Pancreatic duct dilation noted on CT gives concern for possible obstruction.  Gastroenterology to consult. Admission for continued management.  Findings and plan of care discussed with Daleen Bo, MD. Dr. Eulis Foster personally evaluated and examined this patient.  Vitals:   10/31/17 1430 10/31/17 1445 10/31/17 1515 10/31/17 1615  BP: 120/63 126/69 104/82 124/70  Pulse: 64 85 68 71  Resp: (!) 24 (!) 25 (!) 22 12  Temp:      TempSrc:      SpO2: 96% 96% 96% 94%   Vitals:   10/31/17 1445 10/31/17 1515 10/31/17 1615 10/31/17 1700  BP: 126/69 104/82 124/70 (!) 123/53  Pulse: 85 68 71 67  Resp: (!) 25 (!) 22 12   Temp:      TempSrc:      SpO2: 96% 96% 94% 97%      Final Clinical Impressions(s) / ED Diagnoses   Final diagnoses:  Alcohol-induced acute pancreatitis without infection or necrosis    ED Discharge Orders    None       Layla Maw 10/31/17 1720    Daleen Bo, MD 10/31/17 2227

## 2017-10-31 NOTE — ED Notes (Signed)
Patient transported to CT 

## 2017-10-31 NOTE — ED Provider Notes (Signed)
  Face-to-face evaluation   History: He presents for evaluation of abdominal pain following binge drinking several days ago.  Similar in the past. Physical exam: Alert, calm, cooperative.  No dysarthria or aphasia.  No respiratory distress.  Medical screening examination/treatment/procedure(s) were conducted as a shared visit with non-physician practitioner(s) and myself.  I personally evaluated the patient during the encounter    Daleen Bo, MD 10/31/17 2228

## 2017-10-31 NOTE — ED Triage Notes (Signed)
Patient complains of recurrent pancreatitis after drinking for 2 days over the weekend. Nausea with same. No duiarrhea

## 2017-11-01 ENCOUNTER — Inpatient Hospital Stay (HOSPITAL_COMMUNITY): Payer: Medicare Other

## 2017-11-01 DIAGNOSIS — F1011 Alcohol abuse, in remission: Secondary | ICD-10-CM

## 2017-11-01 DIAGNOSIS — I482 Chronic atrial fibrillation: Secondary | ICD-10-CM

## 2017-11-01 DIAGNOSIS — K861 Other chronic pancreatitis: Secondary | ICD-10-CM

## 2017-11-01 DIAGNOSIS — K859 Acute pancreatitis without necrosis or infection, unspecified: Secondary | ICD-10-CM

## 2017-11-01 DIAGNOSIS — Z7901 Long term (current) use of anticoagulants: Secondary | ICD-10-CM

## 2017-11-01 DIAGNOSIS — K852 Alcohol induced acute pancreatitis without necrosis or infection: Principal | ICD-10-CM

## 2017-11-01 LAB — GLUCOSE, CAPILLARY
Glucose-Capillary: 82 mg/dL (ref 65–99)
Glucose-Capillary: 82 mg/dL (ref 65–99)
Glucose-Capillary: 63 mg/dL — ABNORMAL LOW (ref 65–99)
Glucose-Capillary: 74 mg/dL (ref 65–99)

## 2017-11-01 LAB — LIPID PANEL
Cholesterol: 93 mg/dL (ref 0–200)
HDL: 37 mg/dL — ABNORMAL LOW (ref >40)
LDL Cholesterol: 41 mg/dL (ref 0–99)
Total CHOL/HDL Ratio: 2.5 RATIO
Triglycerides: 77 mg/dL (ref <150)
VLDL: 15 mg/dL (ref 0–40)

## 2017-11-01 LAB — COMPREHENSIVE METABOLIC PANEL
ALT: 13 U/L — ABNORMAL LOW (ref 17–63)
AST: 17 U/L (ref 15–41)
Albumin: 3.2 g/dL — ABNORMAL LOW (ref 3.5–5.0)
Alkaline Phosphatase: 55 U/L (ref 38–126)
Anion gap: 10 (ref 5–15)
BUN: 5 mg/dL — ABNORMAL LOW (ref 6–20)
CO2: 26 mmol/L (ref 22–32)
Calcium: 9 mg/dL (ref 8.9–10.3)
Chloride: 102 mmol/L (ref 101–111)
Creatinine, Ser: 0.67 mg/dL (ref 0.61–1.24)
GFR calc Af Amer: >60 mL/min (ref >60)
GFR calc non Af Amer: >60 mL/min (ref >60)
Glucose, Bld: 88 mg/dL (ref 65–99)
Potassium: 4.6 mmol/L (ref 3.5–5.1)
Sodium: 138 mmol/L (ref 135–145)
Total Bilirubin: 1 mg/dL (ref 0.3–1.2)
Total Protein: 6.4 g/dL — ABNORMAL LOW (ref 6.5–8.1)

## 2017-11-01 LAB — CBC
HCT: 40.9 % (ref 39.0–52.0)
Hemoglobin: 13.2 g/dL (ref 13.0–17.0)
MCH: 31 pg (ref 26.0–34.0)
MCHC: 32.3 g/dL (ref 30.0–36.0)
MCV: 96 fL (ref 78.0–100.0)
Platelets: 301 10*3/uL (ref 150–400)
RBC: 4.26 MIL/uL (ref 4.22–5.81)
RDW: 15.4 % (ref 11.5–15.5)
WBC: 9.7 10*3/uL (ref 4.0–10.5)

## 2017-11-01 LAB — LIPASE, BLOOD: Lipase: 570 U/L — ABNORMAL HIGH (ref 11–51)

## 2017-11-01 MED ORDER — PANTOPRAZOLE SODIUM 40 MG IV SOLR
40.0000 mg | INTRAVENOUS | Status: DC
  Administered 2017-11-01: 40 mg via INTRAVENOUS
  Filled 2017-11-01: qty 40

## 2017-11-01 MED ORDER — LORAZEPAM 1 MG PO TABS: 1.0000 mg | ORAL_TABLET | Freq: Once | ORAL | Status: DC

## 2017-11-01 NOTE — Progress Notes (Signed)
[facility]                                                                              Patient Demographics  Timothy Martinez, is a 67 y.o. male, DOB - 05-04-1951, HMC:[id number]  Admit date - 10/31/2017   Admitting Physician Kayleen Memos, DO  Outpatient Primary MD for the patient is [name], DO  Outpatient specialists:   LOS - 1  days   Medical records reviewed and are as summarized below:    Chief Complaint  Patient presents with  . Pancreatitis       Brief summary   Patient is a 67 year old male with alcoholism, chronic pancreatitis, paroxysmal atrial fibrillation on Xarelto, tobacco use presented with severe epigastric pain for 48 hours prior to admission.  Patient reported that he had been alcohol free for a year however 3 days prior to admission he went on binge drinking to celebrate his birthday and had 12 beers.  Next day, patient started having gradually worsening epigastric pain associated with nausea, no vomiting, fevers or chills. CT abdomen pelvis consistent with acute pancreatitis, markedly dilated pancreatic duct  Assessment & Plan    Principal Problem:   Acute on chronic pancreatitis (HCC) - Likely due to alcoholism and binge drinking - CT abdomen pelvis consistent with acute pancreatitis, moderate dilated pancreatic duct -Right upper quadrant ultrasound did not show cholecystitis or gallstones. -Lipid panel showed no hypertriglyceridemia -GI following, recommended continue IV fluid hydration, increase to 150 cc an hour, pain control -Allow clear liquids, PPI, he will likely need MRCP for possible pancreatic duct obstruction/calculus -Patient strongly counseled for abstaining from alcohol -Lipase 924 at the time of admission, improving to 570   Active Problems:   Diabetes mellitus ([place]) -Continue sliding scale insulin -Hemoglobin A1c 6.1     Hyperlipidemia -Lipid panel showed no hypertriglyceridemia, LDL 41, triglycerides 77    Alcohol use disorder, mild, in early remission, abuse - cont CIWA with Ativan, thiamine, folate -Patient counseled on alcohol cessation    GERD (gastroesophageal reflux disease) -Continue PPI    COPD (chronic obstructive pulmonary disease) (HCC) -Continue duo nebs as needed, dulera  Paroxysmal atrial fibrillation (HCC) -Rate controlled, continue metoprolol, Xarelto    Solitary pulmonary nodule -CT abdomen pelvis showed 6 mm small nodule at the left lung base.  Follow-up patient  Tobacco use -Patient counseled, continue nicotine patch   Code Status: Full CODE STATUS DVT Prophylaxis: Xarelto Family Communication: Discussed in detail with the patient, all imaging results, lab results explained to the patient    Disposition Plan:   Time Spent in minutes   35 minutes  Procedures:  CT abdomen pelvis, right upper quadrant ultrasound  Consultants:   Gastroenterology  Antimicrobials:      Medications  Scheduled Meds: . atorvastatin  40 mg Oral q1800  . cyanocobalamin  500 mcg Oral Daily  . famotidine  40 mg Oral QODAY  . folic acid  1 mg Oral Daily  . insulin aspart  0-9 Units Subcutaneous TID WC  . metoprolol tartrate  75 mg Oral BID  . mometasone-formoterol  2 puff Inhalation BID  . multivitamin with  minerals  1 tablet Oral Daily  . nicotine  14 mg Transdermal Daily  . pantoprazole (PROTONIX) IV  40 mg Intravenous Q24H  . rivaroxaban  20 mg Oral Q supper  . thiamine  100 mg Oral Daily   Or  . thiamine  100 mg Intravenous Daily   Continuous Infusions: . sodium chloride 150 mL/hr at 11/01/17 1312   PRN Meds:.acetaminophen **OR** acetaminophen, bisacodyl, HYDROcodone-acetaminophen, HYDROmorphone (DILAUDID) injection, LORazepam **OR** LORazepam, ondansetron **OR** ondansetron (ZOFRAN) IV, ondansetron (ZOFRAN) IV, senna-docusate   Antibiotics   Anti-infectives (From admission, onward)   None        Subjective:   Demarie Hyneman was seen and examined today.   Abdominal pain improving, 7/10, no vomiting, diarrhea. Patient denies dizziness, chest pain, shortness of breath, new weakness, numbess, tingling. No acute events overnight.    Objective:   Vitals:   11/01/17 0500 11/01/17 0630 11/01/17 0722 11/01/17 1408  BP:  130/71  127/68  Pulse:  (!) 59  69  Resp:  16  16  Temp:  97.7 F (36.5 C)  98.1 F (36.7 C)  TempSrc:  Oral  Oral  SpO2:  97% 98% 98%  Weight: 77.1 kg (170 lb)     Height:        Intake/Output Summary (Last 24 hours) at 11/01/2017 1507 Last data filed at 11/01/2017 0626 Gross per 24 hour  Intake 1073.33 ml  Output -  Net 1073.33 ml     Wt Readings from Last 3 Encounters:  11/01/17 77.1 kg (170 lb)  06/13/17 80.2 kg (176 lb 12.8 oz)  05/12/17 80.8 kg (178 lb 3.2 oz)     Exam  General: Alert and oriented x 3, NAD  Eyes: PERRLA, EOMI, Anicteric Sclera,  HEENT:  Atraumatic, normocephalic, normal oropharynx  Cardiovascular: S1 S2 auscultated, no rubs, murmurs or gallops. Regular rate and rhythm.  Respiratory: Clear to auscultation bilaterally, no wheezing, rales or rhonchi  Gastrointestinal: Soft, mild diffuse tenderness worse in epigastric and left upper quadrant region,  nondistended, + bowel sounds  Ext: no pedal edema bilaterally  Neuro: no new deficits   musculoskeletal: No digital cyanosis, clubbing  Skin: No rashes  Psych: Normal affect and demeanor, alert and oriented x3    Data Reviewed:  I have personally reviewed following labs and imaging studies  Micro Results No results found for this or any previous visit (from the past 240 hour(s)).  Radiology Reports Ct Abdomen Pelvis W Contrast  Result Date: 10/31/2017 CLINICAL DATA:  Possible pancreatitis, nausea EXAM: CT ABDOMEN AND PELVIS WITH CONTRAST TECHNIQUE: Multidetector CT imaging of the abdomen and pelvis was performed using the standard protocol following bolus administration of intravenous contrast. CONTRAST:  145mL ISOVUE-300  IOPAMIDOL (ISOVUE-300) INJECTION 61% COMPARISON:  CT abdomen pelvis of 09/04/2017 FINDINGS: Lower chest: Chronic fibrotic changes again are noted at the lung bases. A small nodule at the left lung base appears stable measuring 6 mm in diameter. The heart is within normal limits in size. No pericardial effusion is seen. Hepatobiliary: The liver enhances with no focal abnormality. No calcified gallstones are seen. Pancreas: There has been interval worsening of the changes previously described of acute pancreatitis. There is considerable peripancreatic fluid and edema. No pseudocyst is seen. The pancreatic duct is very dilated to point of possible obstruction by a pancreatic duct calculus as on image 30, series 3. Additional pancreatic calcifications are noted near the neck of the pancreas consistent with chronic pancreatitis. Edema extends throughout the mesenteric fat planes and  toward the porta hepatis adjacent to the gallbladder. Spleen: The spleen appears to have been previously resected. Adrenals/Urinary Tract: The adrenal glands are unremarkable. The kidneys enhance and there is some renovascular calcification bilaterally. No hydronephrosis is seen. On delayed images, the pelvocaliceal systems are unremarkable and the ureters are normal in caliber. The urinary bladder is moderately well distended and minimally thick walled. Stomach/Bowel: The stomach is largely decompressed and difficult to evaluate. No small bowel dilatation is seen although there may be mild edema of the fourth portion of the duodenum just below the pancreatic exudate. The colon is somewhat tortuous and elongated. The terminal ileum is unremarkable as is the appendix. Vascular/Lymphatic: The abdominal aorta is normal in caliber with significant abdominal aortic atherosclerosis noted. Slight dilatation of the distal abdominal aorta is noted with a maximum diameter of 2.1 cm. No aneurysm is seen no adenopathy is noted. Reproductive: The prostate  is within upper limits of normal measuring approximately 3.3 x 4.5 cm. Other: Some inflammatory change from the pancreatitis does extend in the left upper quadrant adjacent to the splenic flexure of colon. Musculoskeletal: The lumbar vertebrae are in normal alignment. There is degenerative disc disease at L5-S1. The SI joints appear corticated IMPRESSION: 1. Worsening in the interval of acute pancreatitis with considerable surrounding edema/exudate and markedly dilated pancreatic duct. The dilatation of the pancreatic duct may be ful to assess further. 2. There may be mild edema and slight dilatation of the fourth portion of the duodenum as result a of the adjacent pancreatic inflammation. 3. Significant abdominal aortic atherosclerosis. Electronically Signed   By: Ivar Drape M.D.   On: 10/31/2017 15:41   US Abdomen Limited Ruq  Result Date: 11/01/2017 CLINICAL DATA:  Pancreatitis EXAM: ULTRASOUND ABDOMEN LIMITED RIGHT UPPER QUADRANT COMPARISON:  CT 10/31/2017 FINDINGS: Gallbladder: Small amount of pericholecystic fluid. No visible stones or wall thickening. Negative sonographic Murphy's. Common bile duct: Diameter: Normal caliber, 4 mm Liver: No focal lesion identified. Within normal limits in parenchymal echogenicity. Portal vein is patent on color Doppler imaging with normal direction of blood flow towards the liver. Trace free fluid adjacent to the liver edge. IMPRESSION: Small amount of fluid adjacent to the gallbladder and liver edge. No visible gallstones or changes of acute cholecystitis. Electronically Signed   By: Rolm Baptise M.D.   On: 11/01/2017 11:07    Lab Data:  CBC: Recent Labs  Lab 10/31/17 0734 11/01/17 0626  WBC 11.0* 9.7  HGB 14.3 13.2  HCT 43.5 40.9  MCV 94.2 96.0  PLT 336 284   Basic Metabolic Panel: Recent Labs  Lab 10/31/17 0734 10/31/17 1425 11/01/17 0626  NA 139  --  138  K 5.3* 4.8 4.6  CL 103  --  102  CO2 28  --  26  GLUCOSE 140*  --  88  BUN 6  --  5*    CREATININE 0.71  --  0.67  CALCIUM 9.9  --  9.0   GFR: Estimated Creatinine Clearance: 99.1 mL/min (by C-G formula based on SCr of 0.67 mg/dL). Liver Function Tests: Recent Labs  Lab 10/31/17 0734 11/01/17 0626  AST 22 17  ALT 15* 13*  ALKPHOS 62 55  BILITOT 0.5 1.0  PROT 7.5 6.4*  ALBUMIN 3.8 3.2*   Recent Labs  Lab 10/31/17 0734 11/01/17 0626  LIPASE 924* 570*   No results for input(s): AMMONIA in the last 168 hours. Coagulation Profile: No results for input(s): INR, PROTIME in the last 168 hours. Cardiac Enzymes:  No results for input(s): CKTOTAL, CKMB, CKMBINDEX, TROPONINI in the last 168 hours. BNP (last 3 results) No results for input(s): PROBNP in the last 8760 hours. HbA1C: Recent Labs    10/31/17 2015  HGBA1C 6.1*   CBG: Recent Labs  Lab 10/31/17 2139 11/01/17 0808 11/01/17 1155  GLUCAP 79 82 82   Lipid Profile: Recent Labs    11/01/17 0643  CHOL 93  HDL 37*  LDLCALC 41  TRIG 77  CHOLHDL 2.5   Thyroid Function Tests: No results for input(s): TSH, T4TOTAL, FREET4, T3FREE, THYROIDAB in the last 72 hours. Anemia Panel: No results for input(s): VITAMINB12, FOLATE, FERRITIN, TIBC, IRON, RETICCTPCT in the last 72 hours. Urine analysis:    Component Value Date/Time   COLORURINE YELLOW 10/31/2017 [place] 10/31/2017 0734   LABSPEC 1.010 10/31/2017 0734   PHURINE 7.0 10/31/2017 0734   GLUCOSEU NEGATIVE 10/31/2017 0734   HGBUR MODERATE (A) 10/31/2017 0734   BILIRUBINUR NEGATIVE 10/31/2017 0734   BILIRUBINUR NEG 02/13/2015 0915   KETONESUR NEGATIVE 10/31/2017 0734   PROTEINUR NEGATIVE 10/31/2017 0734   UROBILINOGEN 0.2 02/13/2015 0915   UROBILINOGEN 0.2 02/09/2014 2239   NITRITE NEGATIVE 10/31/2017 0734   LEUKOCYTESUR NEGATIVE 10/31/2017 0734     Luis Nickles M.D. [facility] 11/01/2017, 3:07 PM  Pager: [phone] Between 7am to 7pm - call Pager - [phone]  After 7pm go to [email or web address] - password [email or web address]  Call  night coverage person covering after 7pm

## 2017-11-01 NOTE — Discharge Instructions (Signed)

## 2017-11-01 NOTE — Consult Note (Addendum)
Consultation  Referring Provider:[facility] hospitalist/Hall DO  Primary Care Physician:  [name], DO Primary Gastroenterologist:  None/unassigned.  Reason for Consultation:   Acute on chronic pancreatitis/dilated PD  HPI: Timothy Martinez is a 67 y.o. male who was admitted last night through the emergency room with complaints of severe epigastric pain over the past couple of days.  He has history of alcohol abuse, chronic pancreatitis, atrial fibrillation for which he is on Xarelto, ongoing tobacco abuse, diabetes mellitus, arthritis and COPD. He stated that he had been abstinent from alcohol over the past several months had a binge of alcohol abuse over the previous 3-4 days to celebrate his birthday.  He then developed severe abdominal pain nausea but no vomiting.  No fever or chills, no diarrhea. Workup in the emergency room with CT of the abdomen and pelvis showed interval worsening of pancreatitis since CT of February 2019 with considerable peripancreatic edema and fluid, there is no pseudocyst.  The pancreatic duct is noted to be very dilated raising question of obstructed duct by pancreatic duct calculus. He does have other pancreatic parenchymal calcifications.  There may be some mild edema in the fourth portion of the duodenum. Upper abdominal ultrasound done today showed a small amount of pericholecystic fluid no gallstones. Lipase on admission 924 and WBC was within normal limits as were LFTs.  Patient says he has been dealing with chronic pancreatitis 10 years and had flareups off and on .  Seen in the emergency room in February 2019 with complaints of lower back pain but was also having epigastric pain and had CT imaging which did show signs of active pancreatitis.  He says he was not having a lot of abdominal pain at that time. He had a brief admission in October 2018 and says he stopped drinking after that. He does not have a regular GI physician prior endoscopic  evaluation.  Patient states he feels better today than he did on admission and can tell that the pancreatitis is improving.  He is requiring narcotics pain recurs as the medication wears off but is not as severe as on admission.  He is not having any nausea or vomiting.  He is hungry.     Past Medical History:  Diagnosis Date  . Acute on chronic pancreatitis ([place]) 05/17/2016  . Alcohol abuse   . Alcohol-induced acute pancreatitis   . Alcoholic pancreatitis   . Anginal pain ([place])   . Anxiety and depression 01/20/2015   PHQ 9 = 11 (01/20/15)    . Arthritis    "eat up w/it" (05/17/2016)  . Atrial fibrillation status post cardioversion ([place]) 08/2014  . Atrial fibrillation with rapid ventricular response ([place])   . Atrial flutter, unspecified   . CHF (congestive heart failure) ([place])   . Chronic anticoagulation   . Chronic back pain   . Chronic bronchitis ([employer])   . COLD (chronic obstructive lung disease) ([place])   . COPD (chronic obstructive pulmonary disease) ([place])   . Daily headache    "need eye examined" (05/17/2016)  . GERD (gastroesophageal reflux disease)   . Hematuria, microscopic 02/13/2015   Noted on UA - Recheck UA at next visit ~ 1 months   . History of atrial fibrillation 06/19/2015  . History of blood transfusion 2003   related to "spleen OR"  . Hyperlipidemia   . Hypertension   . Insomnia 02/11/2016  . Lateral epicondylitis of right elbow 03/17/2017  . Left foot pain 10/12/2013  . Low back  pain 12/21/2012  . Malnutrition of moderate degree 05/18/2016  . Neck pain, bilateral posterior 10/15/2014  . Neuropathy 03/16/2015  . Pain of right upper extremity 05/30/2016  . Pancreatic pseudocyst    seen on CT scan 07/2014  . Pancreatitis 08/04/2014  . Permanent atrial fibrillation ([place]) 05/05/2017  . Pneumonia    "?a few times" (05/17/2016)  . Polyuria 08/21/2012  . Shortness of breath    with ambulation  . Tobacco abuse 05/30/2011   Heavy smoker up to 3 ppd down to 15 cigs per  day in 09/2014   . Tobacco use disorder, continuous   . Type II diabetes mellitus ([place])     Past Surgical History:  Procedure Laterality Date  . BACK SURGERY    . CARDIOVERSION N/A 09/22/2014   Procedure: CARDIOVERSION;  Surgeon: Thayer Headings, MD;  Location: [facility] ENDOSCOPY;  Service: Cardiovascular;  Laterality: N/A;  . NECK SURGERY  1974   not cervical, states had lesion on neck which was removed  . POSTERIOR LAMINECTOMY / DECOMPRESSION LUMBAR SPINE  10/2006   of L4, L5 and S1 with a 5-1 diskectomy, microdissection with the microscope/notes 10/24/2006  . SPLENECTOMY  2003    Prior to Admission medications   Medication Sig Start Date End Date Taking? Authorizing Provider  atorvastatin (LIPITOR) 40 MG tablet Take 1 tablet (40 mg total) by mouth daily at 6 PM. 03/17/17  Yes Eloise Levels, MD  cyanocobalamin 500 MCG tablet Take 500 mcg by mouth daily.   Yes [provider]  famotidine (PEPCID) 40 MG tablet Take 1 tablet (40 mg total) by mouth every other day. 05/19/17  Yes Eloise Levels, MD  metFORMIN (GLUCOPHAGE) 1000 MG tablet Take 1 tablet (1,000 mg total) by mouth 2 (two) times daily with a meal. 03/17/17  Yes Eloise Levels, MD  metoprolol tartrate (LOPRESSOR) 50 MG tablet Take 1.5 tablets (75 mg total) by mouth 2 (two) times daily. 03/17/17  Yes Eloise Levels, MD  mometasone-formoterol (DULERA) 200-5 MCG/ACT AERO Inhale 2 puffs into the lungs 2 (two) times daily. 06/13/17  Yes Eloise Levels, MD  ondansetron (ZOFRAN) 4 MG tablet Take 1 tablet (4 mg total) by mouth every 8 (eight) hours as needed for nausea or vomiting. 09/04/17  Yes Couture, Cortni S, PA-C  XARELTO 20 MG TABS tablet TAKE 1 TABLET (20 MG TOTAL) BY MOUTH DAILY WITH SUPPER. 09/11/17  Yes Nahser, Wonda Cheng, MD  albuterol (PROVENTIL HFA;VENTOLIN HFA) 108 (90 Base) MCG/ACT inhaler Inhale 1-2 puffs into the lungs every 12 (twelve) hours as needed for wheezing or shortness of breath. Patient not taking: Reported  on 10/31/2017 03/13/17   Eloise Levels, MD  Blood Glucose Monitoring Suppl (ONE TOUCH ULTRA 2) W/DEVICE KIT Dispense one glucometer. Diagnosis Type 2 Diabetes. E11.9 09/12/14   Willeen Niece, MD  glucose blood (ONE TOUCH ULTRA TEST) test strip Use check fasting blood glucose qAM. Type 2 Diabetes E11.9 09/12/14   Willeen Niece, MD  nicotine (NICODERM CQ - DOSED IN MG/24 HR) 7 mg/24hr patch Place 1 patch (7 mg total) onto the skin daily. Patient not taking: Reported on 10/31/2017 05/06/17   Sherene Sires, DO  ONE TOUCH LANCETS MISC Check fasting blood glucose qAM. Type 2 DM. E11.9 09/12/14   Willeen Niece, MD  oxyCODONE (OXY IR/ROXICODONE) 5 MG immediate release tablet Take 1 tablet (5 mg total) by mouth every 6 (six) hours as needed for severe pain. Patient not taking: Reported on 10/31/2017  05/05/17   Sherene Sires, DO  oxyCODONE-acetaminophen (PERCOCET) 7.5-325 MG tablet Take 1 tablet by mouth every 8 (eight) hours as needed for severe pain. Patient not taking: Reported on 10/31/2017 09/04/17   Couture, Cortni S, PA-C    Current Facility-Administered Medications  Medication Dose Route Frequency Provider Last Rate Last Dose  . 0.9 %  sodium chloride infusion   Intravenous Continuous Rai, Ripudeep K, MD 150 mL/hr at 11/01/17 0956    . acetaminophen (TYLENOL) tablet 650 mg  650 mg Oral Q6H PRN Rondel Jumbo, PA-C       Or  . acetaminophen (TYLENOL) suppository 650 mg  650 mg Rectal Q6H PRN Rondel Jumbo, PA-C      . atorvastatin (LIPITOR) tablet 40 mg  40 mg Oral q1800 Rondel Jumbo, PA-C      . bisacodyl (DULCOLAX) suppository 10 mg  10 mg Rectal Daily PRN Rondel Jumbo, PA-C      . cyanocobalamin tablet 500 mcg  500 mcg Oral Daily Rondel Jumbo, PA-C   500 mcg at 11/01/17 0930  . famotidine (PEPCID) tablet 40 mg  40 mg Oral QODAY Wertman, Sara E, PA-C   40 mg at 11/01/17 0930  . folic acid (FOLVITE) tablet 1 mg  1 mg Oral Daily Rondel Jumbo, PA-C   1 mg at 11/01/17 0930  .  HYDROcodone-acetaminophen (NORCO/VICODIN) 5-325 MG per tablet 1-2 tablet  1-2 tablet Oral Q4H PRN Rondel Jumbo, PA-C   1 tablet at 11/01/17 1232  . HYDROmorphone (DILAUDID) injection 1 mg  1 mg Intravenous Q4H PRN Irene Pap N, DO   1 mg at 11/01/17 0750  . insulin aspart (novoLOG) injection 0-9 Units  0-9 Units Subcutaneous TID WC Wertman, Coralee Pesa, PA-C      . LORazepam (ATIVAN) tablet 1 mg  1 mg Oral Q6H PRN Rondel Jumbo, PA-C       Or  . LORazepam (ATIVAN) injection 1 mg  1 mg Intravenous Q6H PRN Rondel Jumbo, PA-C      . metoprolol tartrate (LOPRESSOR) tablet 75 mg  75 mg Oral BID Rondel Jumbo, PA-C   75 mg at 11/01/17 0930  . mometasone-formoterol (DULERA) 200-5 MCG/ACT inhaler 2 puff  2 puff Inhalation BID Rondel Jumbo, PA-C   2 puff at 11/01/17 4665  . multivitamin with minerals tablet 1 tablet  1 tablet Oral Daily Rondel Jumbo, PA-C   1 tablet at 11/01/17 0930  . nicotine (NICODERM CQ - dosed in mg/24 hours) patch 14 mg  14 mg Transdermal Daily Irene Pap N, DO   14 mg at 11/01/17 0931  . ondansetron (ZOFRAN) tablet 4 mg  4 mg Oral Q6H PRN Rondel Jumbo, PA-C   4 mg at 11/01/17 0747   Or  . ondansetron (ZOFRAN) injection 4 mg  4 mg Intravenous Q6H PRN Rondel Jumbo, PA-C      . ondansetron (ZOFRAN) injection 4 mg  4 mg Intravenous Q6H PRN Irene Pap N, DO      . rivaroxaban (XARELTO) tablet 20 mg  20 mg Oral Q supper [place] N, DO   20 mg at 10/31/17 1943  . senna-docusate (Senokot-S) tablet 1 tablet  1 tablet Oral QHS PRN Rondel Jumbo, PA-C      . thiamine (VITAMIN B-1) tablet 100 mg  100 mg Oral Daily Sharene Butters E, PA-C   100 mg at 11/01/17 0930   Or  . thiamine (B-1) injection  100 mg  100 mg Intravenous Daily Rondel Jumbo, PA-C   100 mg at 10/31/17 1942    Allergies as of 10/31/2017 - Review Complete 10/31/2017  Allergen Reaction Noted  . Prozac [fluoxetine hcl] Nausea Only 03/27/2015  . Morphine and related Swelling 05/28/2011  .  Penicillins Rash 05/28/2011    Family History  Problem Relation Age of Onset  . Diabetes Mother   . Aneurysm Mother   . Stroke Mother   . Hypertension Mother   . Alcohol abuse Father   . Cancer Father        Lung  . Cancer Sister        Lung Cancer  . Post-traumatic stress disorder Brother   . Heart attack Neg Hx     Social History   Socioeconomic History  . Marital status: Divorced    Spouse name: Not on file  . Number of children: Not on file  . Years of education: Not on file  . Highest education level: Not on file  Occupational History  . Not on file  Social Needs  . Financial resource strain: Not on file  . Food insecurity:    Worry: Not on file    Inability: Not on file  . Transportation needs:    Medical: Not on file    Non-medical: Not on file  Tobacco Use  . Smoking status: Current Every Day Smoker    Packs/day: 1.00    Years: 52.00    Pack years: 52.00    Types: Cigarettes    Start date: 04/09/1964  . Smokeless tobacco: Never Used  . Tobacco comment: down from 2.5 ppd  Substance and Sexual Activity  . Alcohol use: Yes    Comment: drinking 6 beers per week  . Drug use: No  . Sexual activity: Not Currently    Birth control/protection: Condom  Lifestyle  . Physical activity:    Days per week: Not on file    Minutes per session: Not on file  . Stress: Not on file  Relationships  . Social connections:    Talks on phone: Not on file    Gets together: Not on file    Attends religious service: Not on file    Active member of club or organization: Not on file    Attends meetings of clubs or organizations: Not on file    Relationship status: Not on file  . Intimate partner violence:    Fear of current or ex partner: Not on file    Emotionally abused: Not on file    Physically abused: Not on file    Forced sexual activity: Not on file  Other Topics Concern  . 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  Disability due to back pain.     Admits to alcohol abuse in past- no longer drinking- last drink 11/2.     Review of Systems: Pertinent positive and negative review of systems were noted in the above HPI section.  All other review of systems was otherwise negative.  Physical Exam: Vital signs in last 24 hours: Temp:  [97.7 F (36.5 C)-98 F (36.7 C)] 97.7 F (36.5 C) (04/10 0630) Pulse Rate:  [59-85] 59 (04/10 0630) Resp:  [11-25] 16 (04/10 0630) BP: (104-140)/(53-82) 130/71 (04/10 0630) SpO2:  [93 %-98 %] 98 % (04/10 0722) Weight:  [170 lb (77.1 kg)] 170 lb (77.1 kg) (04/10 0500) Last BM Date: 10/30/17  General:   Alert,  Well-developed, well-nourished, white male pleasant and cooperative in NAD Head:  Normocephalic and atraumatic. Eyes:  Sclera clear, no icterus.   Conjunctiva pink. Ears:  Normal auditory acuity. Nose:  No deformity, discharge,  or lesions. Mouth:  No deformity or lesions.   Neck:  Supple; no masses or thyromegaly. Lungs:  Clear throughout to auscultation.   Scattered rhonchi. Heart:  Regular rate and rhythm; no murmurs, clicks, rubs,  or gallops. Abdomen:  Soft,tender across the upper abdomen, no guarding or rebound, previous splenectomy scar, BS active,nonpalp mass or hsm.   Rectal:  Deferred  Msk:  Symmetrical without gross deformities. . Pulses:  Normal pulses noted. Extremities:  Without clubbing or edema. Neurologic:  Alert and  oriented x4;  grossly normal neurologically. Skin:  Intact without significant lesions or rashes.. Psych:  Alert and cooperative. Normal mood and affect.  Intake/Output from previous day: 04/09 0701 - 04/10 0700 In: 1073.3 [I.V.:1073.3] Out: -  Intake/Output this shift: No intake/output data recorded.  Lab Results: Recent Labs    10/31/17 0734 11/01/17 0626  WBC 11.0* 9.7  HGB 14.3 13.2  HCT 43.5 40.9  PLT 336 301   BMET Recent Labs    10/31/17 0734 10/31/17 1425 11/01/17 0626  NA 139  --  138  K  5.3* 4.8 4.6  CL 103  --  102  CO2 28  --  26  GLUCOSE 140*  --  88  BUN 6  --  5*  CREATININE 0.71  --  0.67  CALCIUM 9.9  --  9.0   LFT Recent Labs    11/01/17 0626  PROT 6.4*  ALBUMIN 3.2*  AST 17  ALT 13*  ALKPHOS 55  BILITOT 1.0   PT/INR No results for input(s): LABPROT, INR in the last 72 hours. Hepatitis Panel No results for input(s): HEPBSAG, HCVAB, HEPAIGM, HEPBIGM in the last 72 hours.  IMPRESSION:  #75 67 year old white male with history of recurrent acute pancreatitis in the setting of chronic pancreatitis, currently admitted with 3-day history of epigastric pain and nausea after a short binge with alcohol. CT shows interval worsening of acute changes of pancreatitis with considerable peripancreatic edema and surrounding fluid, there is no pseudocyst Lipase 924 on admission. Patient has an acute exacerbation of pancreatitis, fortunately this appears to be mild with no worrisome parameters.  #2 dilation of the pancreatic duct with interval increase in size of the pancreatic duct -question pancreatic duct calculus  #3 COPD 4 smoker #5 diabetes mellitus Chronic atrial fibrillation Chronic anticoagulation-Xarelto #8 chronic back pain/arthritis  Plan; Allow clear liquids, patient cautioned to back off if this increases his pain  Continue IV fluid hydration PPI daily He  will need MRCP /?secretin ,for further evaluation regarding possible pancreatic duct obstruction/calculus.  Will discuss with Dr. Lyndel Safe regarding timing.    [name] Esterwood  11/01/2017, 12:42 PM   Attending physician's note   I have taken an interval history, reviewed the chart and examined the patient. I agree with the Advanced Practitioner's note, impression and recommendations.   67 year old with relapsing chronic pancreatitis due to alcohol abuse admitted with another episode of acute pancreatitis. PD was dilated with questionable pancreatic duct calculus.  He is on Xarelto for atrial  fibrillation. Plan: MRCP (secretin not available).  Pain control, clear liquid diet, advance as tolerated.  Stop drinking all alcohol and stop smoking.  Does not want ERCP with pancreatic sphincterotomy/ESWL/pancreatic stenting. Check CA 19-9.  Patient understands that he is at  high risk for pancreatic adenocarcinoma. No exocrine pancreatic insufficiency.   Carmell Austria, MD

## 2017-11-02 DIAGNOSIS — J449 Chronic obstructive pulmonary disease, unspecified: Secondary | ICD-10-CM

## 2017-11-02 LAB — COMPREHENSIVE METABOLIC PANEL
ALT: 12 U/L — ABNORMAL LOW (ref 17–63)
AST: 17 U/L (ref 15–41)
Albumin: 3.1 g/dL — ABNORMAL LOW (ref 3.5–5.0)
Alkaline Phosphatase: 55 U/L (ref 38–126)
Anion gap: 9 (ref 5–15)
BUN: 5 mg/dL — ABNORMAL LOW (ref 6–20)
CO2: 24 mmol/L (ref 22–32)
Calcium: 8.6 mg/dL — ABNORMAL LOW (ref 8.9–10.3)
Chloride: 105 mmol/L (ref 101–111)
Creatinine, Ser: 0.73 mg/dL (ref 0.61–1.24)
GFR calc Af Amer: >60 mL/min (ref >60)
GFR calc non Af Amer: >60 mL/min (ref >60)
Glucose, Bld: 88 mg/dL (ref 65–99)
Potassium: 4.1 mmol/L (ref 3.5–5.1)
Sodium: 138 mmol/L (ref 135–145)
Total Bilirubin: 1 mg/dL (ref 0.3–1.2)
Total Protein: 6.2 g/dL — ABNORMAL LOW (ref 6.5–8.1)

## 2017-11-02 LAB — GLUCOSE, CAPILLARY
Glucose-Capillary: 141 mg/dL — ABNORMAL HIGH (ref 65–99)
Glucose-Capillary: 101 mg/dL — ABNORMAL HIGH (ref 65–99)
Glucose-Capillary: 229 mg/dL — ABNORMAL HIGH (ref 65–99)

## 2017-11-02 LAB — LIPASE, BLOOD: Lipase: 197 U/L — ABNORMAL HIGH (ref 11–51)

## 2017-11-02 MED ORDER — NICOTINE 7 MG/24HR TD PT24: 7.0000 mg | MEDICATED_PATCH | Freq: Every day | TRANSDERMAL | 0 refills | Status: DC

## 2017-11-02 MED ORDER — ONDANSETRON HCL 4 MG PO TABS: 4.0000 mg | ORAL_TABLET | Freq: Three times a day (TID) | ORAL | 0 refills | Status: DC | PRN

## 2017-11-02 MED ORDER — TRAMADOL HCL 50 MG PO TABS: 50.0000 mg | ORAL_TABLET | Freq: Three times a day (TID) | ORAL | 0 refills | Status: DC | PRN

## 2017-11-02 MED ORDER — ALBUTEROL SULFATE HFA 108 (90 BASE) MCG/ACT IN AERS: 1.0000 | INHALATION_SPRAY | Freq: Four times a day (QID) | RESPIRATORY_TRACT | 3 refills | Status: DC | PRN

## 2017-11-02 NOTE — Progress Notes (Signed)
Progress Note   Subjective  Feels better. Wants to go home. Tolerating PO well   Objective   Vital signs in last 24 hours: Temp:  [97.6 F (36.4 C)-98.1 F (36.7 C)] 97.9 F (36.6 C) (04/11 0618) Pulse Rate:  [64-69] 68 (04/11 0618) Resp:  [16-18] 18 (04/11 0618) BP: (127-132)/(68-73) 128/73 (04/11 0618) SpO2:  [96 %-99 %] 96 % (04/11 0854) Weight:  [173 lb 4.5 oz (78.6 kg)] 173 lb 4.5 oz (78.6 kg) (04/11 0618) Last BM Date: 10/30/17 General:    white male in NAD Heart:  Regular rate and rhythm; no murmurs Lungs: Respirations even and unlabored, lungs CTA bilaterally Abdomen:  Soft, nontender and nondistended. Normal bowel sounds. Extremities:  Without edema. Neurologic:  Alert and oriented,  grossly normal neurologically. Psych:  Cooperative. Normal mood and affect.  Intake/Output from previous day: 04/10 0701 - 04/11 0700 In: 1800 [I.V.:1800] Out: -  Intake/Output this shift: Total I/O In: 120 [P.O.:120] Out: -   Lab Results: Recent Labs    10/31/17 0734 11/01/17 0626  WBC 11.0* 9.7  HGB 14.3 13.2  HCT 43.5 40.9  PLT 336 301   BMET Recent Labs    10/31/17 0734 10/31/17 1425 11/01/17 0626 11/02/17 0452  NA 139  --  138 138  K 5.3* 4.8 4.6 4.1  CL 103  --  102 105  CO2 28  --  26 24  GLUCOSE 140*  --  88 88  BUN 6  --  5* 5*  CREATININE 0.71  --  0.67 0.73  CALCIUM 9.9  --  9.0 8.6*   LFT Recent Labs    11/02/17 0452  PROT 6.2*  ALBUMIN 3.1*  AST 17  ALT 12*  ALKPHOS 55  BILITOT 1.0   PT/INR No results for input(s): LABPROT, INR in the last 72 hours.  Studies/Results: Ct Abdomen Pelvis W Contrast  Result Date: 10/31/2017 CLINICAL DATA:  Possible pancreatitis, nausea EXAM: CT ABDOMEN AND PELVIS WITH CONTRAST TECHNIQUE: Multidetector CT imaging of the abdomen and pelvis was performed using the standard protocol following bolus administration of intravenous contrast. CONTRAST:  172mL ISOVUE-300 IOPAMIDOL (ISOVUE-300) INJECTION 61%  COMPARISON:  CT abdomen pelvis of 09/04/2017 FINDINGS: Lower chest: Chronic fibrotic changes again are noted at the lung bases. A small nodule at the left lung base appears stable measuring 6 mm in diameter. The heart is within normal limits in size. No pericardial effusion is seen. Hepatobiliary: The liver enhances with no focal abnormality. No calcified gallstones are seen. Pancreas: There has been interval worsening of the changes previously described of acute pancreatitis. There is considerable peripancreatic fluid and edema. No pseudocyst is seen. The pancreatic duct is very dilated to point of possible obstruction by a pancreatic duct calculus as on image 30, series 3. Additional pancreatic calcifications are noted near the neck of the pancreas consistent with chronic pancreatitis. Edema extends throughout the mesenteric fat planes and toward the porta hepatis adjacent to the gallbladder. Spleen: The spleen appears to have been previously resected. Adrenals/Urinary Tract: The adrenal glands are unremarkable. The kidneys enhance and there is some renovascular calcification bilaterally. No hydronephrosis is seen. On delayed images, the pelvocaliceal systems are unremarkable and the ureters are normal in caliber. The urinary bladder is moderately well distended and minimally thick walled. Stomach/Bowel: The stomach is largely decompressed and difficult to evaluate. No small bowel dilatation is seen although there may be mild edema of the fourth portion of the duodenum just below the pancreatic  exudate. The colon is somewhat tortuous and elongated. The terminal ileum is unremarkable as is the appendix. Vascular/Lymphatic: The abdominal aorta is normal in caliber with significant abdominal aortic atherosclerosis noted. Slight dilatation of the distal abdominal aorta is noted with a maximum diameter of 2.1 cm. No aneurysm is seen no adenopathy is noted. Reproductive: The prostate is within upper limits of normal  measuring approximately 3.3 x 4.5 cm. Other: Some inflammatory change from the pancreatitis does extend in the left upper quadrant adjacent to the splenic flexure of colon. Musculoskeletal: The lumbar vertebrae are in normal alignment. There is degenerative disc disease at L5-S1. The SI joints appear corticated IMPRESSION: 1. Worsening in the interval of acute pancreatitis with considerable surrounding edema/exudate and markedly dilated pancreatic duct. The dilatation of the pancreatic duct may be ful to assess further. 2. There may be mild edema and slight dilatation of the fourth portion of the duodenum as result a of the adjacent pancreatic inflammation. 3. Significant abdominal aortic atherosclerosis. Electronically Signed   By: Ivar Drape M.D.   On: 10/31/2017 15:41   US Abdomen Limited Ruq  Result Date: 11/01/2017 CLINICAL DATA:  Pancreatitis EXAM: ULTRASOUND ABDOMEN LIMITED RIGHT UPPER QUADRANT COMPARISON:  CT 10/31/2017 FINDINGS: Gallbladder: Small amount of pericholecystic fluid. No visible stones or wall thickening. Negative sonographic Murphy's. Common bile duct: Diameter: Normal caliber, 4 mm Liver: No focal lesion identified. Within normal limits in parenchymal echogenicity. Portal vein is patent on color Doppler imaging with normal direction of blood flow towards the liver. Trace free fluid adjacent to the liver edge. IMPRESSION: Small amount of fluid adjacent to the gallbladder and liver edge. No visible gallstones or changes of acute cholecystitis. Electronically Signed   By: Rolm Baptise M.D.   On: 11/01/2017 11:07       Assessment / Plan:   67 year old with acute on chronic pancreatitis due to alcohol abuse admitted with another episode of acute pancreatitis after alcohol binge. PD was dilated with questionable pancreatic duct calculus.  He is on Xarelto for atrial fibrillation.   He refuses to get MRCP performed.  Would like to go home.   Follow-up in the GI clinic as an outpatient  if he chooses.  Does not want ERCP/EUS. Will sign off for now.     Carmell Austria MD

## 2017-11-02 NOTE — Discharge Summary (Signed)
Physician Discharge Summary   Patient ID: Timothy Martinez MRN: [id number] DOB/AGE: Jun 16, 1951 67 y.o.  Admit date: 10/31/2017 Discharge date: 11/02/2017  Primary Care Physician:  [name], DO   Recommendations for Outpatient Follow-up:  1. Follow up with PCP in 1-2 weeks 2. Please obtain BMP/CBC in one week  Home Health: None  Equipment/Devices: None  Discharge Condition: stable  CODE STATUS: FULL    Diet recommendation: Low-fat diet   Discharge Diagnoses:   . Acute on chronic pancreatitis ([place]) . Hyperlipidemia . Alcohol use disorder, mild, in early remission, abuse . GERD (gastroesophageal reflux disease) . COPD (chronic obstructive pulmonary disease) ([place]) . Solitary pulmonary nodule . Paroxysmal  atrial fibrillation (HCC) . Diabetes mellitus type 2   Consults: GI, Dr. Lyndel Safe    Allergies:   Allergies  Allergen Reactions  . Prozac [Fluoxetine Hcl] Nausea Only    Made the patient very sick to his stomach  . Morphine And Related Swelling    Pt states he can tolerate oxycodone and hydromorphone  . Penicillins Rash    Has patient had a PCN reaction causing immediate rash, facial/tongue/throat swelling, SOB or lightheadedness with hypotension: Yes Has patient had a PCN reaction causing severe rash involving mucus membranes or skin necrosis: No Has patient had a PCN reaction that required hospitalization: No Has patient had a PCN reaction occurring within the last 10 years: No If all of the above answers are "NO", then may proceed with Cephalosporin use.      DISCHARGE MEDICATIONS: Allergies as of 11/02/2017      Reactions   Prozac [fluoxetine Hcl] Nausea Only   Made the patient very sick to his stomach   Morphine And Related Swelling   Pt states he can tolerate oxycodone and hydromorphone   Penicillins Rash   Has patient had a PCN reaction causing immediate rash, facial/tongue/throat swelling, SOB or lightheadedness with hypotension: Yes Has patient  had a PCN reaction causing severe rash involving mucus membranes or skin necrosis: No Has patient had a PCN reaction that required hospitalization: No Has patient had a PCN reaction occurring within the last 10 years: No If all of the above answers are "NO", then may proceed with Cephalosporin use.      Medication List    TAKE these medications   albuterol 108 (90 Base) MCG/ACT inhaler Commonly known as:  PROVENTIL HFA;VENTOLIN HFA Inhale 1-2 puffs into the lungs every 6 (six) hours as needed for wheezing or shortness of breath. What changed:  when to take this   atorvastatin 40 MG tablet Commonly known as:  LIPITOR Take 1 tablet (40 mg total) by mouth daily at 6 PM.   cyanocobalamin 500 MCG tablet Take 500 mcg by mouth daily.   famotidine 40 MG tablet Commonly known as:  PEPCID Take 1 tablet (40 mg total) by mouth every other day.   glucose blood test strip Commonly known as:  ONE TOUCH ULTRA TEST Use check fasting blood glucose qAM. Type 2 Diabetes E11.9   metFORMIN 1000 MG tablet Commonly known as:  GLUCOPHAGE Take 1 tablet (1,000 mg total) by mouth 2 (two) times daily with a meal.   metoprolol tartrate 50 MG tablet Commonly known as:  LOPRESSOR Take 1.5 tablets (75 mg total) by mouth 2 (two) times daily.   mometasone-formoterol 200-5 MCG/ACT Aero Commonly known as:  DULERA Inhale 2 puffs into the lungs 2 (two) times daily.   nicotine 7 mg/24hr patch Commonly known as:  NICODERM CQ - dosed in  mg/24 hr Place 1 patch (7 mg total) onto the skin daily.   ondansetron 4 MG tablet Commonly known as:  ZOFRAN Take 1 tablet (4 mg total) by mouth every 8 (eight) hours as needed for nausea or vomiting.   ONE TOUCH LANCETS Misc Check fasting blood glucose qAM. Type 2 DM. E11.9   ONE TOUCH ULTRA 2 w/Device Kit Dispense one glucometer. Diagnosis Type 2 Diabetes. E11.9   traMADol 50 MG tablet Commonly known as:  ULTRAM Take 1 tablet (50 mg total) by mouth every 8 (eight)  hours as needed for moderate pain or severe pain.   XARELTO 20 MG Tabs tablet Generic drug:  rivaroxaban TAKE 1 TABLET (20 MG TOTAL) BY MOUTH DAILY WITH SUPPER.        Brief H and P: For complete details please refer to admission H and P, but in briefPatient is a 67 year old male with alcoholism, chronic pancreatitis, paroxysmal atrial fibrillation on Xarelto, tobacco use presented with severe epigastric pain for 48 hours prior to admission.  Patient reported that he had been alcohol free for a year however 3 days prior to admission he went on binge drinking to celebrate his birthday and had 12 beers.  Next day, patient started having gradually worsening epigastric pain associated with nausea, no vomiting, fevers or chills. CT abdomen pelvis consistent with acute pancreatitis, markedly dilated pancreatic duct  Hospital Course:  Acute on chronic pancreatitis ([place]) - Likely due to alcoholism and binge drinking - CT abdomen pelvis consistent with acute pancreatitis, moderate dilated pancreatic duct -Right upper quadrant ultrasound did not show cholecystitis or gallstones. -Lipid panel showed no hypertriglyceridemia -GI was consulted, recommended MRCP for possible pancreatic duct obstruction/calculus.  Patient however refused stating that he is claustrophobic and does not want to undergo MRCP or ERCP.  He was explained that he could be at risk for pancreatic adenocarcinoma, pancreatic duct was dilated on CT.  Patient understands the risks however does not want to pursue further imagings. -Patient strongly counseled for abstaining from alcohol -Currently tolerating p.o. diet     Diabetes mellitus ([place]) type II, -Resume metformin -Hemoglobin A1c 6.1     Hyperlipidemia -Lipid panel showed no hypertriglyceridemia, LDL 41, triglycerides 77, continue Lipitor    Alcohol use disorder, mild, in early remission, abuse -Patient was placed on CIWA with Ativan, thiamine, folate -Patient counseled  on alcohol cessation    GERD (gastroesophageal reflux disease) -Continue PPI    COPD (chronic obstructive pulmonary disease) (HCC) -Continue albuterol as needed, Dulera  Paroxysmal atrial fibrillation (HCC) -Rate controlled, continue metoprolol, Xarelto    Solitary pulmonary nodule -CT abdomen pelvis showed 6 mm small nodule at the left lung base.  Follow-up patient  Tobacco use -Patient counseled, continue nicotine patch      Day of Discharge S: Eager to go home, tolerating p.o. diet, states no nausea vomiting or abdominal pain.  BP 128/73 (BP Location: Right Arm)   Pulse 68   Temp 97.9 F (36.6 C) (Oral)   Resp 18   Ht '6\' 3"'  (1.905 m)   Wt 78.6 kg (173 lb 4.5 oz)   SpO2 96%   BMI 21.66 kg/m   Physical Exam: General: Alert and awake oriented x3 not in any acute distress. HEENT: anicteric sclera, pupils reactive to light and accommodation CVS: S1-S2 clear no murmur rubs or gallops Chest: clear to auscultation bilaterally, no wheezing rales or rhonchi Abdomen: soft nontender, nondistended, normal bowel sounds Extremities: no cyanosis, clubbing or edema noted bilaterally Neuro: Cranial  nerves II-XII intact, no focal neurological deficits   The results of significant diagnostics from this hospitalization (including imaging, microbiology, ancillary and laboratory) are listed below for reference.      Procedures/Studies:  Ct Abdomen Pelvis W Contrast  Result Date: 10/31/2017 CLINICAL DATA:  Possible pancreatitis, nausea EXAM: CT ABDOMEN AND PELVIS WITH CONTRAST TECHNIQUE: Multidetector CT imaging of the abdomen and pelvis was performed using the standard protocol following bolus administration of intravenous contrast. CONTRAST:  128m ISOVUE-300 IOPAMIDOL (ISOVUE-300) INJECTION 61% COMPARISON:  CT abdomen pelvis of 09/04/2017 FINDINGS: Lower chest: Chronic fibrotic changes again are noted at the lung bases. A small nodule at the left lung base appears stable  measuring 6 mm in diameter. The heart is within normal limits in size. No pericardial effusion is seen. Hepatobiliary: The liver enhances with no focal abnormality. No calcified gallstones are seen. Pancreas: There has been interval worsening of the changes previously described of acute pancreatitis. There is considerable peripancreatic fluid and edema. No pseudocyst is seen. The pancreatic duct is very dilated to point of possible obstruction by a pancreatic duct calculus as on image 30, series 3. Additional pancreatic calcifications are noted near the neck of the pancreas consistent with chronic pancreatitis. Edema extends throughout the mesenteric fat planes and toward the porta hepatis adjacent to the gallbladder. Spleen: The spleen appears to have been previously resected. Adrenals/Urinary Tract: The adrenal glands are unremarkable. The kidneys enhance and there is some renovascular calcification bilaterally. No hydronephrosis is seen. On delayed images, the pelvocaliceal systems are unremarkable and the ureters are normal in caliber. The urinary bladder is moderately well distended and minimally thick walled. Stomach/Bowel: The stomach is largely decompressed and difficult to evaluate. No small bowel dilatation is seen although there may be mild edema of the fourth portion of the duodenum just below the pancreatic exudate. The colon is somewhat tortuous and elongated. The terminal ileum is unremarkable as is the appendix. Vascular/Lymphatic: The abdominal aorta is normal in caliber with significant abdominal aortic atherosclerosis noted. Slight dilatation of the distal abdominal aorta is noted with a maximum diameter of 2.1 cm. No aneurysm is seen no adenopathy is noted. Reproductive: The prostate is within upper limits of normal measuring approximately 3.3 x 4.5 cm. Other: Some inflammatory change from the pancreatitis does extend in the left upper quadrant adjacent to the splenic flexure of colon.  Musculoskeletal: The lumbar vertebrae are in normal alignment. There is degenerative disc disease at L5-S1. The SI joints appear corticated IMPRESSION: 1. Worsening in the interval of acute pancreatitis with considerable surrounding edema/exudate and markedly dilated pancreatic duct. The dilatation of the pancreatic duct may be ful to assess further. 2. There may be mild edema and slight dilatation of the fourth portion of the duodenum as result a of the adjacent pancreatic inflammation. 3. Significant abdominal aortic atherosclerosis. Electronically Signed   By: PIvar DrapeM.D.   On: 10/31/2017 15:41   [place]domen Limited Ruq  Result Date: 11/01/2017 CLINICAL DATA:  Pancreatitis EXAM: ULTRASOUND ABDOMEN LIMITED RIGHT UPPER QUADRANT COMPARISON:  CT 10/31/2017 FINDINGS: Gallbladder: Small amount of pericholecystic fluid. No visible stones or wall thickening. Negative sonographic Murphy's. Common bile duct: Diameter: Normal caliber, 4 mm Liver: No focal lesion identified. Within normal limits in parenchymal echogenicity. Portal vein is patent on color Doppler imaging with normal direction of blood flow towards the liver. Trace free fluid adjacent to the liver edge. IMPRESSION: Small amount of fluid adjacent to the gallbladder and liver edge. No visible gallstones  or changes of acute cholecystitis. Electronically Signed   By: Rolm Baptise M.D.   On: 11/01/2017 11:07       LAB RESULTS: Basic Metabolic Panel: Recent Labs  Lab 11/01/17 0626 11/02/17 0452  NA 138 138  K 4.6 4.1  CL 102 105  CO2 26 24  GLUCOSE 88 88  BUN 5* 5*  CREATININE 0.67 0.73  CALCIUM 9.0 8.6*   Liver Function Tests: Recent Labs  Lab 11/01/17 0626 11/02/17 0452  AST 17 17  ALT 13* 12*  ALKPHOS 55 55  BILITOT 1.0 1.0  PROT 6.4* 6.2*  ALBUMIN 3.2* 3.1*   Recent Labs  Lab 11/01/17 0626 11/02/17 0452  LIPASE 570* 197*   No results for input(s): AMMONIA in the last 168 hours. CBC: Recent Labs  Lab  10/31/17 0734 11/01/17 0626  WBC 11.0* 9.7  HGB 14.3 13.2  HCT 43.5 40.9  MCV 94.2 96.0  PLT 336 301   Cardiac Enzymes: No results for input(s): CKTOTAL, CKMB, CKMBINDEX, TROPONINI in the last 168 hours. BNP: Invalid input(s): POCBNP CBG: Recent Labs  Lab 11/02/17 0810 11/02/17 1133  GLUCAP 101* 229*      Disposition and Follow-up: Discharge Instructions    Diet Carb Modified   Complete by:  As directed    Discharge instructions   Complete by:  As directed    LOW FAT DIET   Increase activity slowly   Complete by:  As directed        DISPOSITION: Home   DISCHARGE FOLLOW-UP Follow-up Information    [name], DO. Schedule an appointment as soon as possible for a visit in 2 week(s).   Specialty:  Family Medicine Contact information: [address] [place] [address] [phone]            Time coordinating discharge:  35 minutes  Signed:   Estill Cotta M.D. [facility] 11/02/2017, 12:09 PM Pager: [phone]

## 2017-11-14 ENCOUNTER — Ambulatory Visit (INDEPENDENT_AMBULATORY_CARE_PROVIDER_SITE_OTHER): Payer: Medicare Other | Admitting: Family Medicine

## 2017-11-14 ENCOUNTER — Encounter: Payer: Self-pay | Admitting: Family Medicine

## 2017-11-14 ENCOUNTER — Other Ambulatory Visit: Payer: Self-pay

## 2017-11-14 VITALS — BP 104/58 | HR 64 | Temp 98.0°F | Wt 163.8 lb

## 2017-11-14 DIAGNOSIS — K861 Other chronic pancreatitis: Secondary | ICD-10-CM

## 2017-11-14 DIAGNOSIS — K859 Acute pancreatitis without necrosis or infection, unspecified: Secondary | ICD-10-CM | POA: Diagnosis not present

## 2017-11-14 DIAGNOSIS — J449 Chronic obstructive pulmonary disease, unspecified: Secondary | ICD-10-CM | POA: Diagnosis not present

## 2017-11-14 DIAGNOSIS — K860 Alcohol-induced chronic pancreatitis: Secondary | ICD-10-CM | POA: Diagnosis not present

## 2017-11-14 MED ORDER — ALBUTEROL SULFATE HFA 108 (90 BASE) MCG/ACT IN AERS: 1.0000 | INHALATION_SPRAY | Freq: Four times a day (QID) | RESPIRATORY_TRACT | 3 refills | Status: DC | PRN

## 2017-11-14 MED ORDER — AMITRIPTYLINE HCL 10 MG PO TABS: 10.0000 mg | ORAL_TABLET | Freq: Every day | ORAL | 0 refills | Status: DC

## 2017-11-14 NOTE — Progress Notes (Signed)
Subjective: Chief Complaint  Patient presents with  . Hospital followup     HPI: Timothy Martinez is a 67 y.o. presenting to clinic today to discuss the following:  Hosp f/u for acute on chronic pancreatitis Patient is a 67y/o male with a PMH of chronic pancreatitis and ETOH abuse presenting for follow up due to a recent acute on chronic pancreatitis secondary to alcohol binge where he consumed "at least 12 beers". He states he had been sober for about 8 months and he simply wanted to drink for his birthday and he did. He was treated in the hospital and it was recommended he get an MRCP but he declined. He states today he has some residual left sided pain rated at a 6/10 but much improved from his hospital stay. He states the pain tends to be worse after eating and in the evenings when he tries to go to sleep. He denies nausea, vomiting, blood in the stool, is able to tolerate 3 meals a day, no SOB, chest pain, constipation or diarrhea.   Patient has access to [facility] for his alcohol abuse disorder.  Smoking cessation Patient states he is not ready to quit and continues to smoke 1ppd. He has tried nicotine patches in the past and states "they do not help me". Discussed other medication options but patient declined trying them at this time. Informed patient of quitnow hotline and encouraged patient to attempt to quit when ready as the health benefits of this are numerous.  Med Rec Patient was confused about why he was taking the [facility] inhaler. Stated "it doesn't help me when I get short of breath". Informed patient this was a controller med and should be taken as prescribed everyday regardless of symptoms. Patient said he needed refill of albuterol.  Health Maintenance: overdue opthalmology exam; not discussed today     ROS noted in HPI.   Past Medical, Surgical, Social, and Family History Reviewed & Updated per EMR.   Pertinent Historical Findings include:   Social History   Tobacco  Use  Smoking Status Current Every Day Smoker  . Packs/day: 1.00  . Years: 52.00  . Pack years: 52.00  . Types: Cigarettes  . Start date: 04/09/1964  Smokeless Tobacco Never Used  Tobacco Comment   down from 2.5 ppd    Objective: BP (!) 104/58 (BP Location: Left Arm)   Pulse 64   Temp 98 F (36.7 C)   Wt 163 lb 12.8 oz (74.3 kg)   SpO2 98%   BMI 20.47 kg/m  Vitals and nursing notes reviewed  Physical Exam  Constitutional: He is oriented to person, place, and time. He appears well-developed and well-nourished. No distress.  HENT:  Head: Normocephalic and atraumatic.  Eyes: Pupils are equal, round, and reactive to light. Conjunctivae and EOM are normal. No scleral icterus.  Cardiovascular: Normal rate, regular rhythm, normal heart sounds and intact distal pulses.  No murmur heard. Pulmonary/Chest: Effort normal and breath sounds normal. No stridor. No respiratory distress. He has no wheezes.  Abdominal: Soft. Bowel sounds are normal. He exhibits no distension and no mass. There is tenderness. There is no rebound and no guarding.  Positive for LLQ tenderness on palpation Mild tenderness in the RLQ  Musculoskeletal: Normal range of motion. He exhibits no edema.  Neurological: He is alert and oriented to person, place, and time.  Skin: Skin is warm and dry. No rash noted.   Results for orders placed or performed in visit  on 11/14/17 (from the past 72 hour(s))  Basic Metabolic Panel     Status: Abnormal   Collection Time: 11/14/17  3:51 PM  Result Value Ref Range   Glucose 102 (H) 65 - 99 mg/dL   BUN 9 8 - 27 mg/dL   Creatinine, Ser 0.70 (L) 0.76 - 1.27 mg/dL   GFR calc non Af Amer 98 >59 mL/min/1.73   GFR calc Af Amer 114 >59 mL/min/1.73   BUN/Creatinine Ratio 13 10 - 24   Sodium 141 134 - 144 mmol/L   Potassium 5.2 3.5 - 5.2 mmol/L   Chloride 101 96 - 106 mmol/L   CO2 25 20 - 29 mmol/L   Calcium 9.6 8.6 - 10.2 mg/dL  CBC with Differential     Status: Abnormal    Collection Time: 11/14/17  3:51 PM  Result Value Ref Range   WBC 9.8 3.4 - 10.8 x10E3/uL   RBC 4.41 4.14 - 5.80 x10E6/uL   Hemoglobin 13.5 13.0 - 17.7 g/dL   Hematocrit 40.3 37.5 - 51.0 %   MCV 91 79 - 97 fL   MCH 30.6 26.6 - 33.0 pg   MCHC 33.5 31.5 - 35.7 g/dL   RDW 15.2 12.3 - 15.4 %   Platelets 407 (H) 150 - 379 x10E3/uL   Neutrophils 52 Not Estab. %   Lymphs 32 Not Estab. %   Monocytes 11 Not Estab. %   Eos 4 Not Estab. %   Basos 1 Not Estab. %   Neutrophils Absolute 5.1 1.4 - 7.0 x10E3/uL   Lymphocytes Absolute 3.1 0.7 - 3.1 x10E3/uL   Monocytes Absolute 1.1 (H) 0.1 - 0.9 x10E3/uL   EOS (ABSOLUTE) 0.4 0.0 - 0.4 x10E3/uL   Basophils Absolute 0.1 0.0 - 0.2 x10E3/uL   Immature Granulocytes 0 Not Estab. %   Immature Grans (Abs) 0.0 0.0 - 0.1 x10E3/uL    Assessment/Plan:  Acute on chronic pancreatitis (HCC) Discharged from hospital on 4/11 due to acute on chronic pancreatitis 2/2 to ETOH abuse. Patient is improving with no red flags. Repeat CBC and BMP were unremarkable.   Instructed patient to take 500mg  Tylenol every 8 hours for the next 5 days, continue Tramadol prescribed as hospital (as needed). Started Amitriptyline 10mg  daily as off-label use for pain from chronic pancreatitis. Given a 3 week supply to help him through the remained of this acute attack. Given history of substance abuse and use of Tramadol, he is not a good candidate for further opioid narcotics.  Discussed use of AA   COPD (chronic obstructive pulmonary disease) ([place]) Patient instructed to take Dulera 2 puffs BID daily to control acute exacerbations.  Refilled Albuterol.  Discussed and encouraged smoking cessation. Patient not motivated to stop smoking at this time. I will continue to gauge motivation of interest in quitting smoking.  Consider repeat PFT at next visit     PATIENT EDUCATION PROVIDED: See AVS    Diagnosis and plan along with any newly prescribed medication(s) were discussed in  detail with this patient today. The patient verbalized understanding and agreed with the plan. Patient advised if symptoms worsen return to clinic or ER.   Health Maintainance:   Orders Placed This Encounter  Procedures  . Basic Metabolic Panel  . CBC with Differential    Meds ordered this encounter  Medications  . albuterol (PROVENTIL HFA;VENTOLIN HFA) 108 (90 Base) MCG/ACT inhaler    Sig: Inhale 1-2 puffs into the lungs every 6 (six) hours as needed for wheezing or shortness  of breath.    Dispense:  1 Inhaler    Refill:  3  . amitriptyline (ELAVIL) 10 MG tablet    Sig: Take 1 tablet (10 mg total) by mouth at bedtime.    Dispense:  21 tablet    Refill:  0    Harolyn Rutherford, DO 11/14/2017, 3:15 PM PGY-1, [place]

## 2017-11-14 NOTE — Patient Instructions (Addendum)
It was great to meet you today! Thank you for letting me participate in your care!  Today, we discussed your recent hospitalization due to acute on chronic pancreatitis. Please continue using Tramadol as needed. Please start taking Tylenol extra strength every 8 hours regardless of how you feel for the next 7-10 days.   I have also started you on Amitriptyline 10mg  to help control the pain. You can take this for 3 weeks.  We are also getting follow up blood work for you today. I will notify you of any abnormal results.  Be well, Harolyn Rutherford, DO PGY-1, [facility]

## 2017-11-15 LAB — CBC WITH DIFFERENTIAL/PLATELET
Basophils Absolute: 0.1 10*3/uL (ref 0.0–0.2)
Basos: 1 %
EOS (ABSOLUTE): 0.4 10*3/uL (ref 0.0–0.4)
Eos: 4 %
Hematocrit: 40.3 % (ref 37.5–51.0)
Hemoglobin: 13.5 g/dL (ref 13.0–17.7)
Immature Grans (Abs): 0 10*3/uL (ref 0.0–0.1)
Immature Granulocytes: 0 %
Lymphocytes Absolute: 3.1 10*3/uL (ref 0.7–3.1)
Lymphs: 32 %
MCH: 30.6 pg (ref 26.6–33.0)
MCHC: 33.5 g/dL (ref 31.5–35.7)
MCV: 91 fL (ref 79–97)
Monocytes Absolute: 1.1 10*3/uL — ABNORMAL HIGH (ref 0.1–0.9)
Monocytes: 11 %
Neutrophils Absolute: 5.1 10*3/uL (ref 1.4–7.0)
Neutrophils: 52 %
Platelets: 407 10*3/uL — ABNORMAL HIGH (ref 150–379)
RBC: 4.41 x10E6/uL (ref 4.14–5.80)
RDW: 15.2 % (ref 12.3–15.4)
WBC: 9.8 10*3/uL (ref 3.4–10.8)

## 2017-11-15 LAB — BASIC METABOLIC PANEL
BUN/Creatinine Ratio: 13 (ref 10–24)
BUN: 9 mg/dL (ref 8–27)
CO2: 25 mmol/L (ref 20–29)
Calcium: 9.6 mg/dL (ref 8.6–10.2)
Chloride: 101 mmol/L (ref 96–106)
Creatinine, Ser: 0.7 mg/dL — ABNORMAL LOW (ref 0.76–1.27)
GFR calc Af Amer: 114 mL/min/{1.73_m2} (ref >59)
GFR calc non Af Amer: 98 mL/min/{1.73_m2} (ref >59)
Glucose: 102 mg/dL — ABNORMAL HIGH (ref 65–99)
Potassium: 5.2 mmol/L (ref 3.5–5.2)
Sodium: 141 mmol/L (ref 134–144)

## 2017-11-16 NOTE — Assessment & Plan Note (Signed)
Discharged from hospital on 4/11 due to acute on chronic pancreatitis 2/2 to ETOH abuse. Patient is improving with no red flags. Repeat CBC and BMP were unremarkable.   Instructed patient to take 500mg  Tylenol every 8 hours for the next 5 days, continue Tramadol prescribed as hospital (as needed). Started Amitriptyline 10mg  daily as off-label use for pain from chronic pancreatitis. Given a 3 week supply to help him through the remained of this acute attack. Given history of substance abuse and use of Tramadol, he is not a good candidate for further opioid narcotics.  Discussed use of AA

## 2017-11-16 NOTE — Assessment & Plan Note (Addendum)
Patient instructed to take Dulera 2 puffs BID daily to control acute exacerbations.  Refilled Albuterol.  Discussed and encouraged smoking cessation. Patient not motivated to stop smoking at this time. I will continue to gauge motivation of interest in quitting smoking.  Consider repeat PFT at next visit

## 2017-11-22 ENCOUNTER — Telehealth: Payer: Self-pay

## 2017-11-22 NOTE — Telephone Encounter (Signed)
Anderson Malta- RN with [facility] calling regarding patient. Pt recently hospitalized and Anderson Malta has not been able to reach him. Wanting to know if patient has had followup with [place] since d/c. Advised pt saw Dr. Garlan Fillers 11/14/17. Wallace Cullens, RN

## 2017-12-03 ENCOUNTER — Other Ambulatory Visit: Payer: Self-pay | Admitting: Family Medicine

## 2018-01-18 ENCOUNTER — Other Ambulatory Visit: Payer: Self-pay | Admitting: Family Medicine

## 2018-02-15 ENCOUNTER — Other Ambulatory Visit: Payer: Self-pay

## 2018-02-19 ENCOUNTER — Ambulatory Visit (INDEPENDENT_AMBULATORY_CARE_PROVIDER_SITE_OTHER): Payer: Medicare Other | Admitting: Family Medicine

## 2018-02-19 ENCOUNTER — Other Ambulatory Visit: Payer: Self-pay

## 2018-02-19 ENCOUNTER — Encounter: Payer: Self-pay | Admitting: Family Medicine

## 2018-02-19 VITALS — BP 110/70 | HR 61 | Temp 96.4°F | Wt 158.2 lb

## 2018-02-19 DIAGNOSIS — R634 Abnormal weight loss: Secondary | ICD-10-CM

## 2018-02-19 DIAGNOSIS — K921 Melena: Secondary | ICD-10-CM | POA: Diagnosis not present

## 2018-02-19 DIAGNOSIS — E119 Type 2 diabetes mellitus without complications: Secondary | ICD-10-CM | POA: Diagnosis not present

## 2018-02-19 DIAGNOSIS — M5440 Lumbago with sciatica, unspecified side: Secondary | ICD-10-CM

## 2018-02-19 DIAGNOSIS — G8929 Other chronic pain: Secondary | ICD-10-CM

## 2018-02-19 DIAGNOSIS — Z720 Tobacco use: Secondary | ICD-10-CM

## 2018-02-19 HISTORY — DX: Abnormal weight loss: R63.4

## 2018-02-19 LAB — POCT GLYCOSYLATED HEMOGLOBIN (HGB A1C): HbA1c, POC (controlled diabetic range): 6.1 % (ref 0.0–7.0)

## 2018-02-19 MED ORDER — RIVAROXABAN 20 MG PO TABS: 20.0000 mg | ORAL_TABLET | Freq: Every day | ORAL | 5 refills | Status: DC

## 2018-02-19 MED ORDER — AMITRIPTYLINE HCL 10 MG PO TABS: ORAL_TABLET | ORAL | 3 refills | Status: DC

## 2018-02-19 MED ORDER — METOPROLOL TARTRATE 50 MG PO TABS: 75.0000 mg | ORAL_TABLET | Freq: Two times a day (BID) | ORAL | 3 refills | Status: DC

## 2018-02-19 MED ORDER — METFORMIN HCL 1000 MG PO TABS: 1000.0000 mg | ORAL_TABLET | Freq: Two times a day (BID) | ORAL | 3 refills | Status: DC

## 2018-02-19 MED ORDER — ATORVASTATIN CALCIUM 40 MG PO TABS: 40.0000 mg | ORAL_TABLET | Freq: Every day | ORAL | 3 refills | Status: DC

## 2018-02-19 MED ORDER — GABAPENTIN 100 MG PO CAPS: 100.0000 mg | ORAL_CAPSULE | Freq: Three times a day (TID) | ORAL | 3 refills | Status: DC

## 2018-02-19 NOTE — Assessment & Plan Note (Signed)
Not motivated to quit at this time but patient counseled on importance of smoking cessation to his overall health and life expectancy.

## 2018-02-19 NOTE — Assessment & Plan Note (Signed)
Patient is having chronic low back pain. Poor candidate for pain medications as he has history of substance abuse in the past. Will attempt physical therapy first and then continue to monitor. Trying to avoid pain medications. I have started him on gabapentin as part of his pain symptoms are consistent with sciatica.

## 2018-02-19 NOTE — Patient Instructions (Signed)
It was great to see you today! Thank you for letting me participate in your care!  Today, we discussed your medication refills. I have sent all your refills to your pharmacy. I have discontinued Tramadol as it was not helping with your pain. I have started you on Gabapentin for your neuropathic and sciatic back pain. You can take it three times per day. I have also sent you to physical therapy to help improve your low back pain.  Please be sure to be seen by Gastroenterology at [facility] due to your positive fecal occult blood test.   I have also noticed your recent weight loss. You are still within a normal weight. Please be sure to drink two Ensure each day.  I will see you back in one month.  Be well, Harolyn Rutherford, DO PGY-2, [facility]

## 2018-02-19 NOTE — Assessment & Plan Note (Signed)
Patient trend could be due to decreased consumption of calories as he is no longer drinking alcohol. However, his GI bleed is concerning for other possible issues. No systemic symptoms. Will have patient drink Ensure BID for one month to see if his weigh stabilizes. If continued loss of weight will consider further work up. I am concerned given his long term and continued tobacco use.

## 2018-02-19 NOTE — Progress Notes (Signed)
Subjective: Chief Complaint  Patient presents with  . Diabetes     HPI: Timothy Martinez is a 67 y.o. presenting to clinic today to discuss the following:  Diabetes Mellitus Type 2 Patient is well controlled and taking Metformin. HgA1c is at 6.1% below goal. Patient is staying active everyday by splitting wood. He also is eating a well balanced diet.  Weight Loss Patient has experienced significant weight loss over the past 9 months, down 20lbs from 178 in 05/2017 to 158 today. He has not been trying to lose weight. He has not been vomiting, diarrhea, and has not been restricting what he eats. Due to his history of pancreatitis he does avoid spicy, fatty, fried foods. He denies any abdominal pain or inability to tolerate eating regular meals. He is not missing meals.  Smoking Cessation Patient still not interested in pursuing smoking cessation today. I counseled him that this would be advisable from a medical standpoint. Encouraged patient that smoking cessation will help him medically and patient does understand but not motivated to quit at this time.  Low Back Pain Patient endorses "years" of chronic low back pain that radiates down his left leg and has associated numbness and tingling. Patient states he had a back surgery years ago and he has had this issue ever since. He was taking Tramadol but it was not helping his pain. It has not altered his ability to function.  Health Maintenance: Opthamology exam     ROS noted in HPI.   Past Medical, Surgical, Social, and Family History Reviewed & Updated per EMR.   Pertinent Historical Findings include:   Social History   Tobacco Use  Smoking Status Current Every Day Smoker  . Packs/day: 1.00  . Years: 52.00  . Pack years: 52.00  . Types: Cigarettes  . Start date: 04/09/1964  Smokeless Tobacco Never Used  Tobacco Comment   down from 2.5 ppd    Objective: BP 110/70 (BP Location: Right Arm, Patient Position: Sitting, Cuff  Size: Normal)   Pulse 61   Temp (!) 96.4 F (35.8 C) (Axillary)   Wt 158 lb 3.2 oz (71.8 kg)   SpO2 100%   BMI 19.77 kg/m  Vitals and nursing notes reviewed  Physical Exam Gen: Alert and Oriented x 3, NAD HEENT: Normocephalic, atraumatic, PERRLA, EOMICV: RRR, no murmurs, normal S1, S2 split, +2 pulses dorsalis pedis bilaterally Resp: CTAB, no wheezing, rales, or rhonchi, comfortable work of breathing Abd: non-distended, non-tender, soft, +bs in all four quadrants, post surgical scars Ext: no clubbing, cyanosis, or edema Skin: warm, dry, intact, no rashes Psych: appropriate behavior, mood  Results for orders placed or performed in visit on 02/19/18 (from the past 72 hour(s))  HgB A1c     Status: None   Collection Time: 02/19/18  8:25 AM  Result Value Ref Range   Hemoglobin A1C  4.0 - 5.6 %   HbA1c POC (<> result, manual entry)  4.0 - 5.6 %   HbA1c, POC (prediabetic range)  5.7 - 6.4 %   HbA1c, POC (controlled diabetic range) 6.1 0.0 - 7.0 %    Assessment/Plan:  Loss of weight Patient trend could be due to decreased consumption of calories as he is no longer drinking alcohol. However, his GI bleed is concerning for other possible issues. No systemic symptoms. Will have patient drink Ensure BID for one month to see if his weigh stabilizes. If continued loss of weight will consider further work up. I am concerned  given his long term and continued tobacco use.  Diabetes mellitus ([place]) Well controlled on Metformin, refilled today. Diabetic foot exam today 7/29. Patient reports he was seen by ophthalmology and given good report. Patient states they will send records of visit.  Chronic left-sided low back pain with sciatica Patient is having chronic low back pain. Poor candidate for pain medications as he has history of substance abuse in the past. Will attempt physical therapy first and then continue to monitor. Trying to avoid pain medications. I have started him on gabapentin as part  of his pain symptoms are consistent with sciatica.  Declined smoking cessation Not motivated to quit at this time but patient counseled on importance of smoking cessation to his overall health and life expectancy.   PATIENT EDUCATION PROVIDED: See AVS    Diagnosis and plan along with any newly prescribed medication(s) were discussed in detail with this patient today. The patient verbalized understanding and agreed with the plan. Patient advised if symptoms worsen return to clinic or ER.   Health Maintainance:   Orders Placed This Encounter  Procedures  . HgB A1c    Meds ordered this encounter  Medications  . amitriptyline (ELAVIL) 10 MG tablet    Sig: TAKE 1 TABLET BY MOUTH EVERYDAY AT BEDTIME    Dispense:  30 tablet    Refill:  3  . atorvastatin (LIPITOR) 40 MG tablet    Sig: Take 1 tablet (40 mg total) by mouth daily at 6 PM.    Dispense:  90 tablet    Refill:  3    PATIENT REQUESTED MORE REFILLS.  . metFORMIN (GLUCOPHAGE) 1000 MG tablet    Sig: Take 1 tablet (1,000 mg total) by mouth 2 (two) times daily with a meal.    Dispense:  180 tablet    Refill:  3  . metoprolol tartrate (LOPRESSOR) 50 MG tablet    Sig: Take 1.5 tablets (75 mg total) by mouth 2 (two) times daily.    Dispense:  270 tablet    Refill:  3  . rivaroxaban (XARELTO) 20 MG TABS tablet    Sig: Take 1 tablet (20 mg total) by mouth daily with supper.    Dispense:  30 tablet    Refill:  5  . gabapentin (NEURONTIN) 100 MG capsule    Sig: Take 1 capsule (100 mg total) by mouth 3 (three) times daily.    Dispense:  90 capsule    Refill:  [place], DO 02/19/2018, 8:36 AM PGY-2 [place]

## 2018-02-19 NOTE — Assessment & Plan Note (Signed)
Well controlled on Metformin, refilled today. Diabetic foot exam today 7/29. Patient reports he was seen by ophthalmology and given good report. Patient states they will send records of visit.

## 2018-02-26 ENCOUNTER — Encounter: Payer: Self-pay | Admitting: Gastroenterology

## 2018-03-11 ENCOUNTER — Inpatient Hospital Stay (HOSPITAL_COMMUNITY): Payer: Medicare Other

## 2018-03-11 ENCOUNTER — Other Ambulatory Visit: Payer: Self-pay

## 2018-03-11 ENCOUNTER — Encounter (HOSPITAL_COMMUNITY): Payer: Self-pay | Admitting: Emergency Medicine

## 2018-03-11 ENCOUNTER — Inpatient Hospital Stay (HOSPITAL_COMMUNITY)
Admission: EM | Admit: 2018-03-11 | Discharge: 2018-03-18 | DRG: 438 | Disposition: A | Discharge status: Home / Self Care | Payer: Medicare Other | Attending: Family Medicine | Admitting: Family Medicine

## 2018-03-11 ENCOUNTER — Emergency Department (HOSPITAL_COMMUNITY): Payer: Medicare Other

## 2018-03-11 DIAGNOSIS — K219 Gastro-esophageal reflux disease without esophagitis: Secondary | ICD-10-CM | POA: Diagnosis present

## 2018-03-11 DIAGNOSIS — E785 Hyperlipidemia, unspecified: Secondary | ICD-10-CM | POA: Diagnosis present

## 2018-03-11 DIAGNOSIS — K863 Pseudocyst of pancreas: Principal | ICD-10-CM

## 2018-03-11 DIAGNOSIS — Z9081 Acquired absence of spleen: Secondary | ICD-10-CM

## 2018-03-11 DIAGNOSIS — Z833 Family history of diabetes mellitus: Secondary | ICD-10-CM

## 2018-03-11 DIAGNOSIS — J449 Chronic obstructive pulmonary disease, unspecified: Secondary | ICD-10-CM | POA: Diagnosis present

## 2018-03-11 DIAGNOSIS — G8929 Other chronic pain: Secondary | ICD-10-CM | POA: Diagnosis present

## 2018-03-11 DIAGNOSIS — Z79899 Other long term (current) drug therapy: Secondary | ICD-10-CM | POA: Diagnosis not present

## 2018-03-11 DIAGNOSIS — F17210 Nicotine dependence, cigarettes, uncomplicated: Secondary | ICD-10-CM | POA: Diagnosis present

## 2018-03-11 DIAGNOSIS — Z7901 Long term (current) use of anticoagulants: Secondary | ICD-10-CM

## 2018-03-11 DIAGNOSIS — E1165 Type 2 diabetes mellitus with hyperglycemia: Secondary | ICD-10-CM | POA: Diagnosis present

## 2018-03-11 DIAGNOSIS — K860 Alcohol-induced chronic pancreatitis: Secondary | ICD-10-CM | POA: Diagnosis present

## 2018-03-11 DIAGNOSIS — Z7984 Long term (current) use of oral hypoglycemic drugs: Secondary | ICD-10-CM | POA: Diagnosis not present

## 2018-03-11 DIAGNOSIS — N179 Acute kidney failure, unspecified: Secondary | ICD-10-CM | POA: Diagnosis not present

## 2018-03-11 DIAGNOSIS — K315 Obstruction of duodenum: Secondary | ICD-10-CM | POA: Diagnosis present

## 2018-03-11 DIAGNOSIS — I480 Paroxysmal atrial fibrillation: Secondary | ICD-10-CM | POA: Diagnosis present

## 2018-03-11 DIAGNOSIS — K859 Acute pancreatitis without necrosis or infection, unspecified: Secondary | ICD-10-CM | POA: Diagnosis present

## 2018-03-11 DIAGNOSIS — K852 Alcohol induced acute pancreatitis without necrosis or infection: Secondary | ICD-10-CM | POA: Diagnosis not present

## 2018-03-11 DIAGNOSIS — R109 Unspecified abdominal pain: Secondary | ICD-10-CM

## 2018-03-11 DIAGNOSIS — R195 Other fecal abnormalities: Secondary | ICD-10-CM | POA: Diagnosis not present

## 2018-03-11 DIAGNOSIS — M5440 Lumbago with sciatica, unspecified side: Secondary | ICD-10-CM | POA: Diagnosis present

## 2018-03-11 DIAGNOSIS — R935 Abnormal findings on diagnostic imaging of other abdominal regions, including retroperitoneum: Secondary | ICD-10-CM | POA: Diagnosis not present

## 2018-03-11 DIAGNOSIS — K5900 Constipation, unspecified: Secondary | ICD-10-CM

## 2018-03-11 LAB — CBC
HCT: 47.8 % (ref 39.0–52.0)
Hemoglobin: 15.3 g/dL (ref 13.0–17.0)
MCH: 30.7 pg (ref 26.0–34.0)
MCHC: 32 g/dL (ref 30.0–36.0)
MCV: 95.8 fL (ref 78.0–100.0)
Platelets: 323 10*3/uL (ref 150–400)
RBC: 4.99 MIL/uL (ref 4.22–5.81)
RDW: 14.5 % (ref 11.5–15.5)
WBC: 8.4 10*3/uL (ref 4.0–10.5)

## 2018-03-11 LAB — URINALYSIS, ROUTINE W REFLEX MICROSCOPIC
Bacteria, UA: NONE SEEN
Bilirubin Urine: NEGATIVE
Glucose, UA: NEGATIVE mg/dL
Ketones, ur: NEGATIVE mg/dL
Leukocytes, UA: NEGATIVE
Nitrite: NEGATIVE
Protein, ur: NEGATIVE mg/dL
Specific Gravity, Urine: 1.039 — ABNORMAL HIGH (ref 1.005–1.030)
pH: 5 (ref 5.0–8.0)

## 2018-03-11 LAB — COMPREHENSIVE METABOLIC PANEL
ALT: 121 U/L — ABNORMAL HIGH (ref 0–44)
AST: 111 U/L — ABNORMAL HIGH (ref 15–41)
Albumin: 3.8 g/dL (ref 3.5–5.0)
Alkaline Phosphatase: 157 U/L — ABNORMAL HIGH (ref 38–126)
Anion gap: 6 (ref 5–15)
BUN: 11 mg/dL (ref 8–23)
CO2: 27 mmol/L (ref 22–32)
Calcium: 9.5 mg/dL (ref 8.9–10.3)
Chloride: 104 mmol/L (ref 98–111)
Creatinine, Ser: 0.83 mg/dL (ref 0.61–1.24)
GFR calc Af Amer: >60 mL/min (ref >60)
GFR calc non Af Amer: >60 mL/min (ref >60)
Glucose, Bld: 125 mg/dL — ABNORMAL HIGH (ref 70–99)
Potassium: 5.1 mmol/L (ref 3.5–5.1)
Sodium: 137 mmol/L (ref 135–145)
Total Bilirubin: 0.9 mg/dL (ref 0.3–1.2)
Total Protein: 7.9 g/dL (ref 6.5–8.1)

## 2018-03-11 LAB — GLUCOSE, CAPILLARY
Glucose-Capillary: 73 mg/dL (ref 70–99)
Glucose-Capillary: 74 mg/dL (ref 70–99)

## 2018-03-11 LAB — ETHANOL: Alcohol, Ethyl (B): <10 mg/dL (ref <10)

## 2018-03-11 LAB — LIPASE, BLOOD: Lipase: 1526 U/L — ABNORMAL HIGH (ref 11–51)

## 2018-03-11 MED ORDER — THIAMINE HCL 100 MG/ML IJ SOLN
100.0000 mg | Freq: Every day | INTRAMUSCULAR | Status: DC
  Filled 2018-03-11 (×2): qty 2

## 2018-03-11 MED ORDER — SODIUM CHLORIDE 0.9 % IV BOLUS
1000.0000 mL | Freq: Once | INTRAVENOUS | Status: AC
Start: 2018-03-11 — End: 2018-03-11
  Administered 2018-03-11: 1000 mL via INTRAVENOUS

## 2018-03-11 MED ORDER — IOPAMIDOL (ISOVUE-300) INJECTION 61%
INTRAVENOUS | Status: AC
  Administered 2018-03-11: 100 mL
  Filled 2018-03-11: qty 100

## 2018-03-11 MED ORDER — LORAZEPAM 2 MG/ML IJ SOLN: 1.0000 mg | Freq: Four times a day (QID) | INTRAMUSCULAR | Status: DC | PRN

## 2018-03-11 MED ORDER — INSULIN ASPART 100 UNIT/ML ~~LOC~~ SOLN
0.0000 [IU] | Freq: Three times a day (TID) | SUBCUTANEOUS | Status: DC
  Administered 2018-03-12: 2 [IU] via SUBCUTANEOUS
  Administered 2018-03-13: 1 [IU] via SUBCUTANEOUS
  Administered 2018-03-13 – 2018-03-14 (×2): 2 [IU] via SUBCUTANEOUS
  Administered 2018-03-15: 1 [IU] via SUBCUTANEOUS
  Administered 2018-03-15: 3 [IU] via SUBCUTANEOUS

## 2018-03-11 MED ORDER — MOMETASONE FURO-FORMOTEROL FUM 200-5 MCG/ACT IN AERO
2.0000 | INHALATION_SPRAY | Freq: Two times a day (BID) | RESPIRATORY_TRACT | Status: DC
  Administered 2018-03-11 – 2018-03-18 (×13): 2 via RESPIRATORY_TRACT
  Filled 2018-03-11 (×2): qty 8.8

## 2018-03-11 MED ORDER — SODIUM CHLORIDE 0.9 % IV SOLN
INTRAVENOUS | Status: DC
  Administered 2018-03-11: 22:00:00 via INTRAVENOUS

## 2018-03-11 MED ORDER — LORAZEPAM 1 MG PO TABS
1.0000 mg | ORAL_TABLET | Freq: Four times a day (QID) | ORAL | Status: DC | PRN
  Administered 2018-03-11: 1 mg via ORAL
  Filled 2018-03-11: qty 1

## 2018-03-11 MED ORDER — ONDANSETRON HCL 4 MG/2ML IJ SOLN
4.0000 mg | Freq: Four times a day (QID) | INTRAMUSCULAR | Status: DC | PRN
  Administered 2018-03-12 – 2018-03-16 (×3): 4 mg via INTRAVENOUS
  Filled 2018-03-11 (×3): qty 2

## 2018-03-11 MED ORDER — ACETAMINOPHEN 325 MG PO TABS
650.0000 mg | ORAL_TABLET | Freq: Four times a day (QID) | ORAL | Status: DC | PRN
  Administered 2018-03-11 – 2018-03-17 (×4): 650 mg via ORAL
  Filled 2018-03-11 (×4): qty 2

## 2018-03-11 MED ORDER — ONDANSETRON HCL 4 MG/2ML IJ SOLN
4.0000 mg | Freq: Once | INTRAMUSCULAR | Status: AC
  Administered 2018-03-11: 4 mg via INTRAVENOUS
  Filled 2018-03-11: qty 2

## 2018-03-11 MED ORDER — FOLIC ACID 1 MG PO TABS
1.0000 mg | ORAL_TABLET | Freq: Every day | ORAL | Status: DC
  Administered 2018-03-11 – 2018-03-18 (×8): 1 mg via ORAL
  Filled 2018-03-11 (×8): qty 1

## 2018-03-11 MED ORDER — METOPROLOL TARTRATE 50 MG PO TABS
75.0000 mg | ORAL_TABLET | Freq: Two times a day (BID) | ORAL | Status: DC
  Administered 2018-03-11 – 2018-03-18 (×14): 75 mg via ORAL
  Filled 2018-03-11 (×14): qty 1

## 2018-03-11 MED ORDER — POLYETHYLENE GLYCOL 3350 17 G PO PACK: 17.0000 g | PACK | Freq: Every day | ORAL | Status: DC | PRN

## 2018-03-11 MED ORDER — ALBUTEROL SULFATE (2.5 MG/3ML) 0.083% IN NEBU: 2.5000 mg | INHALATION_SOLUTION | Freq: Four times a day (QID) | RESPIRATORY_TRACT | Status: DC | PRN

## 2018-03-11 MED ORDER — ONDANSETRON HCL 4 MG PO TABS: 4.0000 mg | ORAL_TABLET | Freq: Four times a day (QID) | ORAL | Status: DC | PRN

## 2018-03-11 MED ORDER — FAMOTIDINE IN NACL 20-0.9 MG/50ML-% IV SOLN
20.0000 mg | Freq: Once | INTRAVENOUS | Status: AC
  Administered 2018-03-11: 20 mg via INTRAVENOUS
  Filled 2018-03-11: qty 50

## 2018-03-11 MED ORDER — RIVAROXABAN 20 MG PO TABS
20.0000 mg | ORAL_TABLET | Freq: Every day | ORAL | Status: DC
  Administered 2018-03-11: 20 mg via ORAL
  Filled 2018-03-11: qty 1

## 2018-03-11 MED ORDER — VITAMIN B-1 100 MG PO TABS
100.0000 mg | ORAL_TABLET | Freq: Every day | ORAL | Status: DC
  Administered 2018-03-11 – 2018-03-18 (×8): 100 mg via ORAL
  Filled 2018-03-11 (×8): qty 1

## 2018-03-11 MED ORDER — HYDROMORPHONE HCL 1 MG/ML IJ SOLN
0.5000 mg | Freq: Four times a day (QID) | INTRAMUSCULAR | Status: DC | PRN
  Administered 2018-03-11 – 2018-03-13 (×5): 0.5 mg via INTRAVENOUS
  Filled 2018-03-11 (×5): qty 1

## 2018-03-11 MED ORDER — ACETAMINOPHEN 650 MG RE SUPP: 650.0000 mg | Freq: Four times a day (QID) | RECTAL | Status: DC | PRN

## 2018-03-11 MED ORDER — ADULT MULTIVITAMIN W/MINERALS CH
1.0000 | ORAL_TABLET | Freq: Every day | ORAL | Status: DC
  Administered 2018-03-11 – 2018-03-18 (×8): 1 via ORAL
  Filled 2018-03-11 (×8): qty 1

## 2018-03-11 NOTE — ED Notes (Signed)
Admitting at bedside.  Will transport when they are done

## 2018-03-11 NOTE — ED Notes (Signed)
This RN spoke with lab about "oxygen hood" result for lipase. Result is still pending.

## 2018-03-11 NOTE — ED Triage Notes (Signed)
Pt. Stated, Ive had N/V for 3 days Im not able to keep anything down.

## 2018-03-11 NOTE — Progress Notes (Signed)
Received pt from ED. Patient alert and oriented x4. Patient independently ambulated from stretcher to bed. Patient complaining of pain in the lower abdomen. Will administer PRN pain medication once orders are verified. Patient educated on call bell and room remote. Will continue to monitor.

## 2018-03-11 NOTE — H&P (Addendum)
[facility] Admission History and Physical Service Pager: [phone]  Patient name: Timothy Martinez Medical record number: [id number] Date of birth: February 15, 1951 Age: 67 y.o. Gender: male  Primary Care Provider: [name], DO Consultants: GI Code Status: Full  Chief Complaint: nausea/vomiting/abdominal pain  Assessment and Plan: Timothy Martinez is a 67 y.o. male presenting with N/V x3 days, found to have acute on chronic pancreatitis with obstructing pancreatic pseudocyst. PMH is significant for chronic pancreatitis, type 2 diabetes, COPD, chronic atrial fibrillation, GERD, hyperlipidemia, chronic lower back pain with sciatica, solitary pulmonary nodule, and tobacco use.    Acute on chronic pancreatitis: Pt reports a three day h/o nausea and vomiting, with bilateral lower abdominal pain that radiates to upper abdomen. On arrival, he is afebrile, hemodynamically stable, and in no acute distress. Physical exam notable for diffuse abdominal tenderness with voluntary guarding. CT abdomen showing evidence suggestive of 3.1 cm obstructing pancreatic pseudocyst with impingement of the 2nd portion of the duodenum, that has further progressed since 10/2017. Gallbladder without evidence of acute cholecystitis on CT. Lipase 1,526. AST/ALT 111 and 121 respectively. Alk phos 157. Bilirubin and WBC wnl. Hgb A1c 6.1. Serum alcohol <10. S/p 1L bolus, pepcid, and zofran in the ED. Acute pancreatitis likely 2/2 obstructing pseudocyst noted on imaging, but will also obtain RUQ ultrasound to further visualize the gallbladder. However, cholangitis or choledocholithiasisunlikely as no evidence on CT and patient is afebrile with no signs of jaundice or leukocytosis. GI will be evaluating him in the am and recommended bowel rest overnight.   -Admit to med-surg; attending Dr. Owens Shark   -GI consulted in the ED, will evaluate tomorrow; appreciate recs  -f/u RUQ U/S  -Bowel rest/NPO, sips with  meds and ice chips  -S/p 1L bolus in the ED; cont mIVF 150 ml/hr   -Zofran IV '4mg'$  PRN -Pain control: Tylenol '650mg'$  for mild; Dilaudid IV 0.5 mg q6 PRN mod-severe pain  -AM CMP, CBC -vitals per unit routine   Recent Weight loss: Pt endorses a 20 lb weight loss with night sweats in the past approximately 6 months, despite his normal diet and activity. Wt 158 today. His PCP has recently recommended adding ensure BID with meals for weight gain. Concern for possible underlying lung vs GI malignancy given his smoking history and positive FOBT/dark stools. He is already scheduled for a colonoscopy in 04/2018. Has not had a screening low dose CT chest scan in the past.   -Consider obtaining a screening low dose CT  -Monitor weight   Type 2 Diabetes: A1c 6.1 in 01/2018. Glucose 125 on admission. Takes metformin '1000mg'$  BID at home.  -sSSI  -Monitor glucose q4 -Hold home metformin due to bowel rest   COPD: Stable on RA, uses Dulera BID and albuterol as needed at home. However, has been using his albuterol daily for coughing and perceived SOB. He currently denies any SOB. Recommend initiating further controller medications, such as LAMA, outpatient.  -Cont home dulera BID  -Albuterol as needed for SOB   Paroxysmal Atrial Fibrillation s/p Cardioversion: Stable, follows with Dr. Acie Fredrickson [facility]. Appears to be in sinus rhythm on exam and is rate controlled. S/p Cardioversion in 08/2014. Takes metoprolol 50 mg BID and Xarelto 20 mg at home.  -EKG  -Cont home metoprolol '50mg'$  BID -Cont home Xarelto '20mg'$    GERD: Stable, not endorsing symptoms currently.  -Holding home famotidine 40 mg qod d/t bowel rest   Hyperlipidemia: Lipid panel in 10/2017 wnl. Takes Atorvastatin 40 mg daily  at home.  -Hold home atorvastatin d/t bowel rest  Chronic lower back pain with sciatica: Stable, states his back pain is doing okay currently. Does not use any chronic pain medications, but recently started on gabapentin in  01/2018. Pt is wanting to stop the gabapentin, states it is making his pain worse.  -Hold home gabapentin -has IV Dilaudid PRN for pancreatitis pain, as above  Alcohol use: Reports former h/o drinking beer, never any hard liquor.  Last reported drink was 2 months ago.  -monitor on CIWA  -folate, thiamine and daily multivitamin per protocol  Tobacco Use: Smokes 1-2 PPD, offered nicotine patch, pt declines at this time.   History of solitary pulmonary nodule: Small stable 2m nodule in the LL base last noted in 10/2017 CT abdomen, however not noted in today's CT abdomen, possibly not high enough for visualization.   -Cont to monitor    FEN/GI: NPO/sips with meds, mIVF NS 150 ml/hr  Prophylaxis: home xarelto   Disposition: Admit to med-surg, GI to see tomorrow    History of Present Illness:  Timothy Martinez a 67y.o. male, with a past history significant for chronic alcoholic pancreatitis and previous alcohol abuse, presenting with nausea, vomiting, and abdominal pain for the past three days. He vomited twice after eating on Friday, but has not had any further emesis episodes or solid food since then. He states he has been drinking plenty of water and coffee, without associated nausea or vomiting. Endorses concurrent bilateral lower abdominal pain radiating to his upper abdomen that is worse with movement, but denies any epigastric pain. He tried pepto bismol with minimal relief in his symptoms. He does note this abdominal pain is different than his previous episodes of pancreatitis, but as he has been in the ED, feels it is becoming more similar. He denies any recent fever, chills, change in bowel movements, dysuria, other urinary symptoms, or testicular pain. Last bowel movement yesterday morning was of normal consistency, but dark. He no longer drinks alcohol, with last reported beer around two months ago.   He also notes a 20 lb weight loss in the approximately the last 6 months, despite his  normal diet and activity. He has had a positive FOBT, for which he is scheduled for a colonoscopy in October. His primary care physician has advised drinking two ensures BID between meals for weight gain.   On arrival to the ED, he was hemodynamically stable and in no acute distress. CT abdomen showing an abnormal pancreatic head and proximal duodenum with an appearance most suggestive of obstructing pseudocyst, 3.1 cm in size, with impingement on 2nd portion of duodenum. Gallbladder distended on CT, but without CT evidence of acute cholecystitis. Serum alcohol <10. CBC and CMP significant for Wbc 8.4, Hgb 15.3, AST/ALT 111 and 121, alk phos 157, Ca 9.5, and Cr 0.83. GI was consulted while in the ED, who will be evaluating the patient in the am and recommended bowel rest in the meantime. During our evaluation, the patient is very hungry and eager to eat something.    Review Of Systems: Per HPI with the following additions:  Review of Systems  Constitutional: Positive for weight loss. Negative for chills and fever.  Respiratory: Positive for cough. Negative for hemoptysis and shortness of breath.   Cardiovascular: Negative for chest pain, palpitations and leg swelling.  Gastrointestinal: Positive for abdominal pain, nausea and vomiting.  Genitourinary: Negative for dysuria, frequency, hematuria and urgency.  Musculoskeletal: Positive for back pain. Negative  for myalgias.  Skin: Negative for rash.  Neurological: Negative for dizziness, seizures and weakness.   Patient Active Problem List   Diagnosis Date Noted  . Blood in stool 02/19/2018  . Loss of weight 02/19/2018  . Declined smoking cessation 02/19/2018  . Acute on chronic pancreatitis ([place]) 10/31/2017  . Solitary pulmonary nodule 05/12/2017  . Pancreatitis, alcoholic, acute 84/13/2440  . Chronic atrial fibrillation ([place])   . On continuous oral anticoagulation 06/19/2015  . Chronic left-sided low back pain with sciatica 12/21/2012  .  COPD (chronic obstructive pulmonary disease) ([place]) 04/27/2012  . GERD (gastroesophageal reflux disease) 03/05/2012  . Alcohol use disorder, mild, in early remission, abuse 06/09/2011  . Diabetes mellitus ([place]) 05/30/2011  . Chronic alcoholic pancreatitis ([place]) 05/30/2011  . Hyperlipidemia 05/30/2011  . Tobacco abuse 05/30/2011    Past Medical History: Past Medical History:  Diagnosis Date  . Acute on chronic pancreatitis ([place]) 05/17/2016  . Alcohol abuse   . Alcohol-induced acute pancreatitis   . Alcoholic pancreatitis   . Anginal pain ([place])   . Anxiety and depression 01/20/2015   PHQ 9 = 11 (01/20/15)    . Arthritis    "eat up w/it" (05/17/2016)  . Atrial fibrillation status post cardioversion ([place]) 08/2014  . Atrial fibrillation with rapid ventricular response ([place])   . Atrial flutter, unspecified   . CHF (congestive heart failure) ([place])   . Chronic anticoagulation   . Chronic back pain   . Chronic bronchitis ([place])   . COLD (chronic obstructive lung disease) ([place])   . COPD (chronic obstructive pulmonary disease) ([place])   . Daily headache    "need eye examined" (05/17/2016)  . GERD (gastroesophageal reflux disease)   . Hematuria, microscopic 02/13/2015   Noted on UA - Recheck UA at next visit ~ 1 months   . History of atrial fibrillation 06/19/2015  . History of blood transfusion 2003   related to "spleen OR"  . Hyperlipidemia   . Hypertension   . Insomnia 02/11/2016  . Lateral epicondylitis of right elbow 03/17/2017  . Left foot pain 10/12/2013  . Low back pain 12/21/2012  . Malnutrition of moderate degree 05/18/2016  . Neck pain, bilateral posterior 10/15/2014  . Neuropathy 03/16/2015  . Pain of right upper extremity 05/30/2016  . Pancreatic pseudocyst    seen on CT scan 07/2014  . Pancreatitis 08/04/2014  . Permanent atrial fibrillation ([place]) 05/05/2017  . Pneumonia    "?a few times" (05/17/2016)  . Polyuria 08/21/2012  . Shortness of breath    with ambulation  . Tobacco  abuse 05/30/2011   Heavy smoker up to 3 ppd down to 15 cigs per day in 09/2014   . Tobacco use disorder, continuous   . Type II diabetes mellitus ([place])     Past Surgical History: Past Surgical History:  Procedure Laterality Date  . BACK SURGERY    . CARDIOVERSION N/A 09/22/2014   Procedure: CARDIOVERSION;  Surgeon: Thayer Headings, MD;  Location: [facility] ENDOSCOPY;  Service: Cardiovascular;  Laterality: N/A;  . NECK SURGERY  1974   not cervical, states had lesion on neck which was removed  . POSTERIOR LAMINECTOMY / DECOMPRESSION LUMBAR SPINE  10/2006   of L4, L5 and S1 with a 5-1 diskectomy, microdissection with the microscope/notes 10/24/2006  . SPLENECTOMY  2003    Social History: Social History   Tobacco Use  . Smoking status: Current Every Day Smoker    Packs/day: 1.00    Years: 52.00    Pack  years: 52.00    Types: Cigarettes    Start date: 04/09/1964  . Smokeless tobacco: Never Used  . Tobacco comment: down from 2.5 ppd  Substance Use Topics  . Alcohol use: Yes    Comment: drinking 6 beers per week  . Drug use: No   Additional social history: Lives alone at home. Current tobacco smoker, 1-2 PPD, has been smoking since he was about 67 years old.  No current alcohol use, used to drink beer - stopped 2 months ago, no illicit drug use.    Family History: Family History  Problem Relation Age of Onset  . Diabetes Mother   . Aneurysm Mother   . Stroke Mother   . Hypertension Mother   . Alcohol abuse Father   . Cancer Father        Lung  . Cancer Sister        Lung Cancer  . Post-traumatic stress disorder Brother   . Heart attack Neg Hx     Allergies and Medications: Allergies  Allergen Reactions  . Prozac [Fluoxetine Hcl] Nausea Only    Made the patient very sick to his stomach  . Morphine And Related Swelling    Pt states he can tolerate oxycodone and hydromorphone  . Penicillins Rash    Has patient had a PCN reaction causing immediate rash, facial/tongue/throat  swelling, SOB or lightheadedness with hypotension: Yes Has patient had a PCN reaction causing severe rash involving mucus membranes or skin necrosis: No Has patient had a PCN reaction that required hospitalization: No Has patient had a PCN reaction occurring within the last 10 years: No If all of the above answers are "NO", then may proceed with Cephalosporin use.    No current facility-administered medications on file prior to encounter.    Current Outpatient Medications on File Prior to Encounter  Medication Sig Dispense Refill  . albuterol (PROVENTIL HFA;VENTOLIN HFA) 108 (90 Base) MCG/ACT inhaler Inhale 1-2 puffs into the lungs every 6 (six) hours as needed for wheezing or shortness of breath. 1 Inhaler 3  . amitriptyline (ELAVIL) 10 MG tablet TAKE 1 TABLET BY MOUTH EVERYDAY AT BEDTIME 30 tablet 3  . atorvastatin (LIPITOR) 40 MG tablet Take 1 tablet (40 mg total) by mouth daily at 6 PM. 90 tablet 3  . Blood Glucose Monitoring Suppl (ONE TOUCH ULTRA 2) W/DEVICE KIT Dispense one glucometer. Diagnosis Type 2 Diabetes. E11.9 1 each 0  . cyanocobalamin 500 MCG tablet Take 500 mcg by mouth daily.    . famotidine (PEPCID) 40 MG tablet Take 1 tablet (40 mg total) by mouth every other day. 90 tablet 3  . gabapentin (NEURONTIN) 100 MG capsule Take 1 capsule (100 mg total) by mouth 3 (three) times daily. 90 capsule 3  . glucose blood (ONE TOUCH ULTRA TEST) test strip Use check fasting blood glucose qAM. Type 2 Diabetes E11.9 100 each 3  . metFORMIN (GLUCOPHAGE) 1000 MG tablet Take 1 tablet (1,000 mg total) by mouth 2 (two) times daily with a meal. 180 tablet 3  . metoprolol tartrate (LOPRESSOR) 50 MG tablet Take 1.5 tablets (75 mg total) by mouth 2 (two) times daily. 270 tablet 3  . mometasone-formoterol (DULERA) 200-5 MCG/ACT AERO Inhale 2 puffs into the lungs 2 (two) times daily. 1 Inhaler 3  . nicotine (NICODERM CQ - DOSED IN MG/24 HR) 7 mg/24hr patch Place 1 patch (7 mg total) onto the skin daily.  28 patch 0  . ondansetron (ZOFRAN) 4 MG tablet Take 1  tablet (4 mg total) by mouth every 8 (eight) hours as needed for nausea or vomiting. 30 tablet 0  . ONE TOUCH LANCETS MISC Check fasting blood glucose qAM. Type 2 DM. E11.9 200 each 1  . rivaroxaban (XARELTO) 20 MG TABS tablet Take 1 tablet (20 mg total) by mouth daily with supper. 30 tablet 5    Objective: BP (!) 129/54   Pulse 66   Temp 97.7 F (36.5 C) (Oral)   Resp 12   Ht '6\' 3"'$  (1.905 m)   Wt 71.7 kg   SpO2 99%   BMI 19.75 kg/m  Exam: General: Alert, NAD, lying comfortably in bed  HEENT: NCAT, MM dry, oropharynx nonerythematous. No jaundice noted.  Cardiac: RRR no m/g/r Lungs: Clear bilaterally with poor air movement and prolonged exhalation phase, no increased WOB. Satting well on RA.   Abdomen: soft, non-distended, tender diffusely with deep palpation with voluntary guarding. No involuntary guarding or rebounding. Unable to test Murphy's. Normoactive BS x4.  MSK: Moves all extremities spontaneously  Ext: Warm, dry, 2+ distal pulses, no edema  Derm: no rashes or bruising noted  Psych: appropriate affect   Labs and Imaging: CBC BMET  Recent Labs  Lab 03/11/18 0950  WBC 8.4  HGB 15.3  HCT 47.8  PLT 323   Recent Labs  Lab 03/11/18 0950  NA 137  K 5.1  CL 104  CO2 27  BUN 11  CREATININE 0.83  GLUCOSE 125*  CALCIUM 9.5     Ct Abdomen Pelvis W Contrast  Result Date: 03/11/2018 CLINICAL DATA:  68 year old male with abdominal distention. Nausea vomiting for 3 days. EXAM: CT ABDOMEN AND PELVIS WITH CONTRAST TECHNIQUE: Multidetector CT imaging of the abdomen and pelvis was performed using the standard protocol following bolus administration of intravenous contrast. CONTRAST:  171m ISOVUE-300 IOPAMIDOL (ISOVUE-300) INJECTION 61% COMPARISON:  CT Abdomen and Pelvis 10/31/2017 and earlier. FINDINGS: Lower chest: Chronic reticular opacity in both lower lobes. No acute pulmonary opacity, pleural or pericardial effusion.  Hepatobiliary: The gallbladder is more distended. No pericholecystic fluid. Central biliary and intrahepatic ductal dilatation is chronic but increased (series 3, image 18). No discrete liver lesion. Pancreas: Rim enhancing cystic abnormality at the pancreatic head and uncinate further detailed below. Underlying chronic calcific pancreatitis with progressive dystrophic calcification of the pancreatic head and uncinate since April. Chronic dilated main pancreatic duct and parenchymal atrophy. The acute peripancreatic inflammation seen in April has largely resolved. Spleen: Surgically absent as before. Adrenals/Urinary Tract: Normal adrenal glands. Bilateral renal enhancement is stable and within normal limits. On the delayed images there is less renal contrast excretion today, persistent nephrogram. No hydronephrosis or proximal hydroureter. Diminutive and unremarkable urinary bladder. Stomach/Bowel: Gas distended rectum. Redundant sigmoid colon with mild retained gas and stool. Mild diverticulosis. Similar mild retained stool in the descending colon, with retained low-density stool throughout the transverse colon. Similar mild retained stool in the right colon. Negative terminal ileum. Normal appendix (coronal image 48). Flocculated material in distal small bowel loops. No dilated small bowel. The distal stomach and duodenum bulb are dilated with mild wall thickening and hyperenhancement. The pyloric channel is suspected on series 3, image 23 and is open. There is mild periduodenal inflammation, and the 2nd portion of the duodenum appears obstructed by an irregular cystic mass with simple fluid density encompassing 18 x 29 by 31 millimeters (AP by transverse by CC). There is enhancing wall thickening around the mass, which may have been partially developed in April but less obstructing  at that time. Superimposed chronic calcific pancreatitis with severe chronic dilatation of the pancreatic duct in the body and tail.  The mass and both the upstream and downstream lumen are thought to be demonstrated on coronal image 40. The duodenum is decompressed distal to the lesion. The proximal small bowel is decompressed. The mass is likely in proximity to the ampulla. No abdominal free air. Trace right pararenal space fluid similar to that in April. Vascular/Lymphatic: Aortoiliac calcified atherosclerosis. The major arterial structures in the abdomen and pelvis are patent. The main portal vein is patent, although there is chronic narrowing of the junction of the main portal vein and SMV. The SMV is patent. No lymphadenopathy. Reproductive: Negative. Other: No pelvic free fluid. Musculoskeletal: No acute osseous abnormality identified. IMPRESSION: 1. Abnormal pancreatic head and proximal duodenum with an appearance most suggestive of an obstructing pseudocyst measuring 3.1 cm. This has progressed since 10/31/2017 and now results in high-grade impingement on the lumen of the 2nd portion of the duodenum. Endoscopy may be most valuable at this point. 2. Chronic but increased intrahepatic biliary ductal dilatation. Gallbladder distention without CT evidence of acute cholecystitis. 3. Chronic calcific pancreatitis with no acute pancreatic inflammation today. No peripancreatic lymphadenopathy. 4. Surgically absent spleen. 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 5. Chronic lower lobe lung disease. 6.  Aortic Atherosclerosis (ICD10-I70.0). Electronically Signed   By: Genevie Ann M.D.   On: 03/11/2018 14:22    Patriciaann Clan, DO 03/11/2018, 3:07 PM PGY-1, [facility] pager: [phone], text pages welcome  I have seen and evaluated the above patient with Dr. Higinio Plan and agree with her documentation.  I have included my edits in blue.   Lovenia Kim MD [place] PGY3

## 2018-03-11 NOTE — ED Provider Notes (Signed)
[place] [facility] EMERGENCY DEPARTMENT Provider Note   CSN: [id number] Arrival date & time: 03/11/18  9476     History   Chief Complaint Chief Complaint  Patient presents with  . Emesis  . Nausea    HPI Timothy Martinez is a 67 y.o. male.  Patient complains of vomiting for 3 days.  Minimal abdominal discomfort.  Patient has a history of alcohol abuse but has not had any alcohol in 2 months  The history is provided by the patient. No language interpreter was used.  Emesis   This is a new problem. The current episode started more than 2 days ago. The problem occurs 2 to 4 times per day. The problem has not changed since onset.The emesis has an appearance of stomach contents. There has been no fever (No fever). Pertinent negatives include no abdominal pain, no chills, no cough, no diarrhea and no headaches. Risk factors: Pancreatitis.    Past Medical History:  Diagnosis Date  . Acute on chronic pancreatitis ([place]) 05/17/2016  . Alcohol abuse   . Alcohol-induced acute pancreatitis   . Alcoholic pancreatitis   . Anginal pain ([place])   . Anxiety and depression 01/20/2015   PHQ 9 = 11 (01/20/15)    . Arthritis    "eat up w/it" (05/17/2016)  . Atrial fibrillation status post cardioversion ([place]) 08/2014  . Atrial fibrillation with rapid ventricular response ([place])   . Atrial flutter, unspecified   . CHF (congestive heart failure) ([place])   . Chronic anticoagulation   . Chronic back pain   . Chronic bronchitis ([place])   . COLD (chronic obstructive lung disease) ([place])   . COPD (chronic obstructive pulmonary disease) ([place])   . Daily headache    "need eye examined" (05/17/2016)  . GERD (gastroesophageal reflux disease)   . Hematuria, microscopic 02/13/2015   Noted on UA - Recheck UA at next visit ~ 1 months   . History of atrial fibrillation 06/19/2015  . History of blood transfusion 2003   related to "spleen OR"  . Hyperlipidemia   . Hypertension   . Insomnia 02/11/2016  .  Lateral epicondylitis of right elbow 03/17/2017  . Left foot pain 10/12/2013  . Low back pain 12/21/2012  . Malnutrition of moderate degree 05/18/2016  . Neck pain, bilateral posterior 10/15/2014  . Neuropathy 03/16/2015  . Pain of right upper extremity 05/30/2016  . Pancreatic pseudocyst    seen on CT scan 07/2014  . Pancreatitis 08/04/2014  . Permanent atrial fibrillation ([place]) 05/05/2017  . Pneumonia    "?a few times" (05/17/2016)  . Polyuria 08/21/2012  . Shortness of breath    with ambulation  . Tobacco abuse 05/30/2011   Heavy smoker up to 3 ppd down to 15 cigs per day in 09/2014   . Tobacco use disorder, continuous   . Type II diabetes mellitus [facility])     Patient Active Problem List   Diagnosis Date Noted  . Pancreatic pseudocyst 03/11/2018  . Blood in stool 02/19/2018  . Loss of weight 02/19/2018  . Declined smoking cessation 02/19/2018  . Acute on chronic pancreatitis ([place]) 10/31/2017  . Solitary pulmonary nodule 05/12/2017  . Pancreatitis, alcoholic, acute 54/65/0354  . Chronic atrial fibrillation ([place])   . On continuous oral anticoagulation 06/19/2015  . Chronic left-sided low back pain with sciatica 12/21/2012  . COPD (chronic obstructive pulmonary disease) ([place]) 04/27/2012  . GERD (gastroesophageal reflux disease) 03/05/2012  . Alcohol use disorder, mild, in early remission, abuse 06/09/2011  .  Diabetes mellitus ([place]) 05/30/2011  . Chronic alcoholic pancreatitis ([place]) 05/30/2011  . Hyperlipidemia 05/30/2011  . Tobacco abuse 05/30/2011    Past Surgical History:  Procedure Laterality Date  . BACK SURGERY    . CARDIOVERSION N/A 09/22/2014   Procedure: CARDIOVERSION;  Surgeon: Thayer Headings, MD;  Location: [facility] ENDOSCOPY;  Service: Cardiovascular;  Laterality: N/A;  . NECK SURGERY  1974   not cervical, states had lesion on neck which was removed  . POSTERIOR LAMINECTOMY / DECOMPRESSION LUMBAR SPINE  10/2006   of L4, L5 and S1 with a 5-1 diskectomy, microdissection with  the microscope/notes 10/24/2006  . SPLENECTOMY  2003        Home Medications    Prior to Admission medications   Medication Sig Start Date End Date Taking? Authorizing Provider  albuterol (PROVENTIL HFA;VENTOLIN HFA) 108 (90 Base) MCG/ACT inhaler Inhale 1-2 puffs into the lungs every 6 (six) hours as needed for wheezing or shortness of breath. 11/14/17   [name], DO  amitriptyline (ELAVIL) 10 MG tablet TAKE 1 TABLET BY MOUTH EVERYDAY AT BEDTIME 02/19/18   Lockamy, Timothy, DO  atorvastatin (LIPITOR) 40 MG tablet Take 1 tablet (40 mg total) by mouth daily at 6 PM. 02/19/18   Lockamy, Timothy, DO  Blood Glucose Monitoring Suppl (ONE TOUCH ULTRA 2) W/DEVICE KIT Dispense one glucometer. Diagnosis Type 2 Diabetes. E11.9 09/12/14   Willeen Niece, MD  cyanocobalamin 500 MCG tablet Take 500 mcg by mouth daily.    [provider]  famotidine (PEPCID) 40 MG tablet Take 1 tablet (40 mg total) by mouth every other day. 05/19/17   Eloise Levels, MD  gabapentin (NEURONTIN) 100 MG capsule Take 1 capsule (100 mg total) by mouth 3 (three) times daily. 02/19/18   [name], DO  glucose blood (ONE TOUCH ULTRA TEST) test strip Use check fasting blood glucose qAM. Type 2 Diabetes E11.9 09/12/14   Willeen Niece, MD  metFORMIN (GLUCOPHAGE) 1000 MG tablet Take 1 tablet (1,000 mg total) by mouth 2 (two) times daily with a meal. 02/19/18   Lockamy, Timothy, DO  metoprolol tartrate (LOPRESSOR) 50 MG tablet Take 1.5 tablets (75 mg total) by mouth 2 (two) times daily. 02/19/18   [name], DO  mometasone-formoterol (DULERA) 200-5 MCG/ACT AERO Inhale 2 puffs into the lungs 2 (two) times daily. 06/13/17   Eloise Levels, MD  nicotine (NICODERM CQ - DOSED IN MG/24 HR) 7 mg/24hr patch Place 1 patch (7 mg total) onto the skin daily. 11/02/17   Rai, Vernelle Emerald, MD  ondansetron (ZOFRAN) 4 MG tablet Take 1 tablet (4 mg total) by mouth every 8 (eight) hours as needed for nausea or vomiting. 11/02/17    Rai, Vernelle Emerald, MD  ONE TOUCH LANCETS MISC Check fasting blood glucose qAM. Type 2 DM. E11.9 09/12/14   Willeen Niece, MD  rivaroxaban (XARELTO) 20 MG TABS tablet Take 1 tablet (20 mg total) by mouth daily with supper. 02/19/18   [name], DO    Family History Family History  Problem Relation Age of Onset  . Diabetes Mother   . Aneurysm Mother   . Stroke Mother   . Hypertension Mother   . Alcohol abuse Father   . Cancer Father        Lung  . Cancer Sister        Lung Cancer  . Post-traumatic stress disorder Brother   . Heart attack Neg Hx     Social History Social History  Tobacco Use  . Smoking status: Current Every Day Smoker    Packs/day: 1.00    Years: 52.00    Pack years: 52.00    Types: Cigarettes    Start date: 04/09/1964  . Smokeless tobacco: Never Used  . Tobacco comment: down from 2.5 ppd  Substance Use Topics  . Alcohol use: Yes    Comment: drinking 6 beers per week  . Drug use: No     Allergies   Prozac [fluoxetine hcl]; Morphine and related; and Penicillins   Review of Systems Review of Systems  Constitutional: Negative for appetite change, chills and fatigue.  HENT: Negative for congestion, ear discharge and sinus pressure.   Eyes: Negative for discharge.  Respiratory: Negative for cough.   Cardiovascular: Negative for chest pain.  Gastrointestinal: Positive for vomiting. Negative for abdominal pain and diarrhea.  Genitourinary: Negative for frequency and hematuria.  Musculoskeletal: Negative for back pain.  Skin: Negative for rash.  Neurological: Negative for seizures and headaches.  Psychiatric/Behavioral: Negative for hallucinations.     Physical Exam Updated Vital Signs BP 122/69   Pulse 60   Temp 97.7 F (36.5 C) (Oral)   Resp 12   Ht '6\' 3"'  (1.905 m)   Wt 71.7 kg   SpO2 98%   BMI 19.75 kg/m   Physical Exam  Constitutional: He is oriented to person, place, and time. He appears well-developed.  HENT:  Head:  Normocephalic.  Eyes: Conjunctivae and EOM are normal. No scleral icterus.  Neck: Neck supple. No thyromegaly present.  Cardiovascular: Normal rate and regular rhythm. Exam reveals no gallop and no friction rub.  No murmur heard. Pulmonary/Chest: No stridor. He has no wheezes. He has no rales. He exhibits no tenderness.  Abdominal: He exhibits no distension. There is tenderness. There is no rebound.  Minimal periumbilical abdominal tenderness  Musculoskeletal: Normal range of motion. He exhibits no edema.  Lymphadenopathy:    He has no cervical adenopathy.  Neurological: He is oriented to person, place, and time. He exhibits normal muscle tone. Coordination normal.  Skin: No rash noted. No erythema.  Psychiatric: He has a normal mood and affect. His behavior is normal.     ED Treatments / Results  Labs (all labs ordered are listed, but only abnormal results are displayed) Labs Reviewed  LIPASE, BLOOD - Abnormal; Notable for the following components:      Result Value   Lipase 1,526 (*)    All other components within normal limits  COMPREHENSIVE METABOLIC PANEL - Abnormal; Notable for the following components:   Glucose, Bld 125 (*)    AST 111 (*)    ALT 121 (*)    Alkaline Phosphatase 157 (*)    All other components within normal limits  CBC  ETHANOL  URINALYSIS, ROUTINE W REFLEX MICROSCOPIC    EKG None  Radiology Ct Abdomen Pelvis W Contrast  Result Date: 03/11/2018 CLINICAL DATA:  67 year old male with abdominal distention. Nausea vomiting for 3 days. EXAM: CT ABDOMEN AND PELVIS WITH CONTRAST TECHNIQUE: Multidetector CT imaging of the abdomen and pelvis was performed using the standard protocol following bolus administration of intravenous contrast. CONTRAST:  120m ISOVUE-300 IOPAMIDOL (ISOVUE-300) INJECTION 61% COMPARISON:  CT Abdomen and Pelvis 10/31/2017 and earlier. FINDINGS: Lower chest: Chronic reticular opacity in both lower lobes. No acute pulmonary opacity,  pleural or pericardial effusion. Hepatobiliary: The gallbladder is more distended. No pericholecystic fluid. Central biliary and intrahepatic ductal dilatation is chronic but increased (series 3, image 18). No discrete liver  lesion. Pancreas: Rim enhancing cystic abnormality at the pancreatic head and uncinate further detailed below. Underlying chronic calcific pancreatitis with progressive dystrophic calcification of the pancreatic head and uncinate since April. Chronic dilated main pancreatic duct and parenchymal atrophy. The acute peripancreatic inflammation seen in April has largely resolved. Spleen: Surgically absent as before. Adrenals/Urinary Tract: Normal adrenal glands. Bilateral renal enhancement is stable and within normal limits. On the delayed images there is less renal contrast excretion today, persistent nephrogram. No hydronephrosis or proximal hydroureter. Diminutive and unremarkable urinary bladder. Stomach/Bowel: Gas distended rectum. Redundant sigmoid colon with mild retained gas and stool. Mild diverticulosis. Similar mild retained stool in the descending colon, with retained low-density stool throughout the transverse colon. Similar mild retained stool in the right colon. Negative terminal ileum. Normal appendix (coronal image 48). Flocculated material in distal small bowel loops. No dilated small bowel. The distal stomach and duodenum bulb are dilated with mild wall thickening and hyperenhancement. The pyloric channel is suspected on series 3, image 23 and is open. There is mild periduodenal inflammation, and the 2nd portion of the duodenum appears obstructed by an irregular cystic mass with simple fluid density encompassing 18 x 29 by 31 millimeters (AP by transverse by CC). There is enhancing wall thickening around the mass, which may have been partially developed in April but less obstructing at that time. Superimposed chronic calcific pancreatitis with severe chronic dilatation of the  pancreatic duct in the body and tail. The mass and both the upstream and downstream lumen are thought to be demonstrated on coronal image 40. The duodenum is decompressed distal to the lesion. The proximal small bowel is decompressed. The mass is likely in proximity to the ampulla. No abdominal free air. Trace right pararenal space fluid similar to that in April. Vascular/Lymphatic: Aortoiliac calcified atherosclerosis. The major arterial structures in the abdomen and pelvis are patent. The main portal vein is patent, although there is chronic narrowing of the junction of the main portal vein and SMV. The SMV is patent. No lymphadenopathy. Reproductive: Negative. Other: No pelvic free fluid. Musculoskeletal: No acute osseous abnormality identified. IMPRESSION: 1. Abnormal pancreatic head and proximal duodenum with an appearance most suggestive of an obstructing pseudocyst measuring 3.1 cm. This has progressed since 10/31/2017 and now results in high-grade impingement on the lumen of the 2nd portion of the duodenum. Endoscopy may be most valuable at this point. 2. Chronic but increased intrahepatic biliary ductal dilatation. Gallbladder distention without CT evidence of acute cholecystitis. 3. Chronic calcific pancreatitis with no acute pancreatic inflammation today. No peripancreatic lymphadenopathy. 4. Surgically absent spleen. 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 5. Chronic lower lobe lung disease. 6.  Aortic Atherosclerosis (ICD10-I70.0). Electronically Signed   By: Genevie Ann M.D.   On: 03/11/2018 14:22    Procedures Procedures (including critical care time)  Medications Ordered in ED Medications  sodium chloride 0.9 % bolus 1,000 mL (0 mLs Intravenous Stopped 03/11/18 1340)  famotidine (PEPCID) IVPB 20 mg premix (0 mg Intravenous Stopped 03/11/18 1304)  ondansetron (ZOFRAN) injection 4 mg (4 mg Intravenous Given 03/11/18 1239)  iopamidol (ISOVUE-300) 61  % injection (100 mLs  Contrast Given 03/11/18 1309)     Initial Impression / Assessment and Plan / ED Course  I have reviewed the triage vital signs and the nursing notes.  Pertinent labs & imaging results that were available during my care of the patient were reviewed by me and considered in my  medical decision making (see chart for details).     She with large pancreatic pseudocyst.  He will be admitted to medicine and GI will see patient tomorrow.  Final Clinical Impressions(s) / ED Diagnoses   Final diagnoses:  Pancreatic pseudocyst    ED Discharge Orders    None       Tomaz Ferguson, MD 03/11/18 [phone]

## 2018-03-12 DIAGNOSIS — R935 Abnormal findings on diagnostic imaging of other abdominal regions, including retroperitoneum: Secondary | ICD-10-CM

## 2018-03-12 LAB — CBC
HCT: 45.1 % (ref 39.0–52.0)
Hemoglobin: 14.5 g/dL (ref 13.0–17.0)
MCH: 30.4 pg (ref 26.0–34.0)
MCHC: 32.2 g/dL (ref 30.0–36.0)
MCV: 94.5 fL (ref 78.0–100.0)
Platelets: 281 10*3/uL (ref 150–400)
RBC: 4.77 MIL/uL (ref 4.22–5.81)
RDW: 14.2 % (ref 11.5–15.5)
WBC: 14 10*3/uL — ABNORMAL HIGH (ref 4.0–10.5)

## 2018-03-12 LAB — COMPREHENSIVE METABOLIC PANEL
ALT: 82 U/L — ABNORMAL HIGH (ref 0–44)
AST: 55 U/L — ABNORMAL HIGH (ref 15–41)
Albumin: 3.5 g/dL (ref 3.5–5.0)
Alkaline Phosphatase: 143 U/L — ABNORMAL HIGH (ref 38–126)
Anion gap: 8 (ref 5–15)
BUN: 9 mg/dL (ref 8–23)
CO2: 24 mmol/L (ref 22–32)
Calcium: 9.3 mg/dL (ref 8.9–10.3)
Chloride: 106 mmol/L (ref 98–111)
Creatinine, Ser: 0.83 mg/dL (ref 0.61–1.24)
GFR calc Af Amer: >60 mL/min (ref >60)
GFR calc non Af Amer: >60 mL/min (ref >60)
Glucose, Bld: 74 mg/dL (ref 70–99)
Potassium: 5 mmol/L (ref 3.5–5.1)
Sodium: 138 mmol/L (ref 135–145)
Total Bilirubin: 1.3 mg/dL — ABNORMAL HIGH (ref 0.3–1.2)
Total Protein: 7.4 g/dL (ref 6.5–8.1)

## 2018-03-12 LAB — PHOSPHORUS: Phosphorus: 3.5 mg/dL (ref 2.5–4.6)

## 2018-03-12 LAB — GLUCOSE, CAPILLARY
Glucose-Capillary: 112 mg/dL — ABNORMAL HIGH (ref 70–99)
Glucose-Capillary: 66 mg/dL — ABNORMAL LOW (ref 70–99)
Glucose-Capillary: 70 mg/dL (ref 70–99)
Glucose-Capillary: 141 mg/dL — ABNORMAL HIGH (ref 70–99)
Glucose-Capillary: 106 mg/dL — ABNORMAL HIGH (ref 70–99)
Glucose-Capillary: 174 mg/dL — ABNORMAL HIGH (ref 70–99)
Glucose-Capillary: 142 mg/dL — ABNORMAL HIGH (ref 70–99)

## 2018-03-12 LAB — MAGNESIUM: Magnesium: 1.3 mg/dL — ABNORMAL LOW (ref 1.7–2.4)

## 2018-03-12 LAB — C-REACTIVE PROTEIN: CRP: 0.8 mg/dL (ref <1.0)

## 2018-03-12 MED ORDER — PANTOPRAZOLE SODIUM 40 MG PO TBEC: 40.0000 mg | DELAYED_RELEASE_TABLET | Freq: Every day | ORAL | Status: DC

## 2018-03-12 MED ORDER — ENSURE ENLIVE PO LIQD
237.0000 mL | Freq: Two times a day (BID) | ORAL | Status: DC
  Administered 2018-03-13 – 2018-03-16 (×2): 237 mL via ORAL

## 2018-03-12 MED ORDER — MAGNESIUM SULFATE 2 GM/50ML IV SOLN
2.0000 g | Freq: Once | INTRAVENOUS | Status: AC
  Administered 2018-03-12: 2 g via INTRAVENOUS
  Filled 2018-03-12: qty 50

## 2018-03-12 MED ORDER — DEXTROSE 50 % IV SOLN
INTRAVENOUS | Status: AC
  Administered 2018-03-12: 09:00:00
  Filled 2018-03-12: qty 50

## 2018-03-12 MED ORDER — DEXTROSE 50 % IV SOLN
25.0000 mL | Freq: Once | INTRAVENOUS | Status: AC
Start: 2018-03-12 — End: 2018-03-12
  Administered 2018-03-12: 25 mL via INTRAVENOUS

## 2018-03-12 MED ORDER — PANTOPRAZOLE SODIUM 40 MG PO TBEC
40.0000 mg | DELAYED_RELEASE_TABLET | Freq: Every day | ORAL | Status: DC
  Administered 2018-03-12 – 2018-03-16 (×5): 40 mg via ORAL
  Filled 2018-03-12 (×5): qty 1

## 2018-03-12 MED ORDER — DEXTROSE 50 % IV SOLN
25.0000 mL | Freq: Once | INTRAVENOUS | Status: AC
  Administered 2018-03-12: 25 mL via INTRAVENOUS

## 2018-03-12 MED ORDER — DEXTROSE-NACL 5-0.45 % IV SOLN
INTRAVENOUS | Status: DC
  Administered 2018-03-12 (×2): via INTRAVENOUS

## 2018-03-12 MED ORDER — DEXTROSE 50 % IV SOLN
INTRAVENOUS | Status: AC
  Filled 2018-03-12: qty 50

## 2018-03-12 NOTE — Progress Notes (Signed)
Family Medicine Teaching Service Daily Progress Note Intern Pager: [phone]  Patient name: Timothy Martinez Medical record number: [id number] Date of birth: 02/13/1951 Age: 67 y.o. Gender: male  Primary Care Provider: [name], DO Consultants: GI Code Status: Full  Pt Overview and Major Events to Date:  8/18 admitted  Assessment and Plan: Timothy Martinez is a 67 y.o. male presenting with N/V x3 days, found to have acute on chronic pancreatitis with obstructing pancreatic pseudocyst. PMH is significant for chronic pancreatitis, type 2 diabetes, COPD, chronic atrial fibrillation, GERD, hyperlipidemia, chronic lower back pain with sciatica, solitary pulmonary nodule, and tobacco use.    Acute on chronic pancreatitis with duodenum obstructing pseudocyst:  Patient reports minimal abdominal pain, with no N/V since admission. On exam, he is hemodynamically stable and TTP of the abdomen with voluntary guarding.  RUQ ultrasound on 8/18 showing no evidence of cholelithiasis or acute cholecystitis.  Common bile duct within normal limits. WBC elevated at 14, from 8.4 yesterday.  AST/ALT trending down, 55, 82 today. Alk phos 143. Bilirubin 1.3 today, from 0.9. Required 1 dose of IV pain medication overnight. GI to evaluate the patient today. -GI consult in the ED on 8/18, to evaluate today; appreciate recs - Bowel rest/n.p.o., sips with meds and ice chips - Continue MIVF 125 mL/hour -Zofran IV 4 mg as needed -Pain control: Tylenol 650 mg for mild and Dilaudid 0.5 mg every 6 PRN for moderate to severe - Monitor CMP, CBC  Type 2 Diabetes: A1c 6.1 in 01/2018. Glucose low overnight, ranging around 70 with low of 66, received dextrose. Takes metformin 1056m BID at home.  -sSSI; did not require any overnight  -Monitor glucose q4 -Hold home metformin due to bowel rest   COPD: Stable on RA, uses Dulera BID and albuterol as needed at home. However, has been using his albuterol daily for coughing and  perceived SOB. He currently denies any SOB. Recommend initiating further controller medications, such as LAMA, outpatient.  -Cont home dulera BID  -Albuterol as needed for SOB   Paroxysmal Atrial Fibrillation s/p Cardioversion: Stable, follows with Dr. [name]. Appears to be in sinus rhythm on exam and is rate controlled. S/p Cardioversion in 08/2014. Takes metoprolol 50 mg BID and Xarelto 20 mg at home.  -EKG pending  -Cont home metoprolol 579mBID -Cont home Xarelto 2055m Recent Weight loss: Pt endorses a 20 lb weight loss with night sweats in the past approximately 6 months, despite his normal diet and activity. His PCP has recently recommended adding ensure BID with meals for weight gain. Concern for possible underlying lung vs GI malignancy given his smoking history and positive FOBT/dark stools. He is already scheduled for a colonoscopy in 04/2018. Has not had a screening low dose CT chest scan in the past.   -Set up a screening low dose CT outpatient  -Monitor weight   Chronic lower back pain with sciatica: Stable, complains of his back pain this am. Does not use any chronic pain medications, and stopped taking his home gabapentin.  -Hold home gabapentin -has IV Dilaudid PRN for pancreatitis pain, as above -Add KPad  GERD: Stable, not endorsing symptoms currently.  -Holding home famotidine 40 mg qod d/t bowel rest   Hyperlipidemia: Lipid panel in 10/2017 wnl. Takes Atorvastatin 40 mg daily at home.  -Hold home atorvastatin d/t bowel rest  Alcohol use: Reports former h/o drinking beer, never any hard liquor.  Last reported drink was 2 months ago.  -monitor on  CIWA; 0-2 overnight   -Folate, thiamine, and multivitamin per protocol   Tobacco Use: Smokes 1-2 PPD, offered nicotine patch, pt declined at admission.   History of solitary pulmonary nodule: Small stable 45m nodule in the LL base last noted in 10/2017 CT abdomen, however not noted in 8/18 CT abdomen, possibly  not high enough for visualization.   -Cont to monitor    FEN/GI: NPO/sips with meds, mIVF NS 125 ml/hr  Prophylaxis: home xarelto held after pm dose 8/18; SCDs for current   Disposition: Continued inpatient care; Evaluation with GI   Subjective:  No acute events overnight.  Patient continues to complain of lower abdominal pain that radiates up into his epigastric region.  Also endorses low back pain, which he has chronically, states this is his normal pain.  No N/V, patient wants his coffee this morning.  Objective: Temp:  [97.7 F (36.5 C)-98.1 F (36.7 C)] 98.1 F (36.7 C) (08/18 1817) Pulse Rate:  [58-90] 68 (08/18 2044) Resp:  [12-22] 16 (08/18 1817) BP: (122-139)/(54-85) 133/75 (08/18 2044) SpO2:  [93 %-100 %] 98 % (08/18 2102) Weight:  [71.7 kg-72 kg] 72 kg (08/18 1636) Physical Exam: General: Alert, NAD HEENT: NCAT, oropharynx nonerythematous  Cardiac: RRR no m/g/r Lungs: Clear bilaterally with decreased air exchange, no increased WOB, on RA Abdomen: soft, tender to all quadrants with voluntary guarding. Normoactive BS.  Msk: Moves all extremities spontaneously  Ext: Warm, dry, 2+ distal pulses, no edema    Laboratory: Recent Labs  Lab 03/11/18 0950  WBC 8.4  HGB 15.3  HCT 47.8  PLT 323   Recent Labs  Lab 03/11/18 0950  NA 137  K 5.1  CL 104  CO2 27  BUN 11  CREATININE 0.83  CALCIUM 9.5  PROT 7.9  BILITOT 0.9  ALKPHOS 157*  ALT 121*  AST 111*  GLUCOSE 125*     Imaging/Diagnostic Tests: Ct Abdomen Pelvis W Contrast  Result Date: 03/11/2018 CLINICAL DATA:  67year old male with abdominal distention. Nausea vomiting for 3 days. EXAM: CT ABDOMEN AND PELVIS WITH CONTRAST TECHNIQUE: Multidetector CT imaging of the abdomen and pelvis was performed using the standard protocol following bolus administration of intravenous contrast. CONTRAST:  1098mISOVUE-300 IOPAMIDOL (ISOVUE-300) INJECTION 61% COMPARISON:  CT Abdomen and Pelvis 10/31/2017 and earlier.  FINDINGS: Lower chest: Chronic reticular opacity in both lower lobes. No acute pulmonary opacity, pleural or pericardial effusion. Hepatobiliary: The gallbladder is more distended. No pericholecystic fluid. Central biliary and intrahepatic ductal dilatation is chronic but increased (series 3, image 18). No discrete liver lesion. Pancreas: Rim enhancing cystic abnormality at the pancreatic head and uncinate further detailed below. Underlying chronic calcific pancreatitis with progressive dystrophic calcification of the pancreatic head and uncinate since April. Chronic dilated main pancreatic duct and parenchymal atrophy. The acute peripancreatic inflammation seen in April has largely resolved. Spleen: Surgically absent as before. Adrenals/Urinary Tract: Normal adrenal glands. Bilateral renal enhancement is stable and within normal limits. On the delayed images there is less renal contrast excretion today, persistent nephrogram. No hydronephrosis or proximal hydroureter. Diminutive and unremarkable urinary bladder. Stomach/Bowel: Gas distended rectum. Redundant sigmoid colon with mild retained gas and stool. Mild diverticulosis. Similar mild retained stool in the descending colon, with retained low-density stool throughout the transverse colon. Similar mild retained stool in the right colon. Negative terminal ileum. Normal appendix (coronal image 48). Flocculated material in distal small bowel loops. No dilated small bowel. The distal stomach and duodenum bulb are dilated with mild wall thickening and  hyperenhancement. The pyloric channel is suspected on series 3, image 23 and is open. There is mild periduodenal inflammation, and the 2nd portion of the duodenum appears obstructed by an irregular cystic mass with simple fluid density encompassing 18 x 29 by 31 millimeters (AP by transverse by CC). There is enhancing wall thickening around the mass, which may have been partially developed in April but less obstructing  at that time. Superimposed chronic calcific pancreatitis with severe chronic dilatation of the pancreatic duct in the body and tail. The mass and both the upstream and downstream lumen are thought to be demonstrated on coronal image 40. The duodenum is decompressed distal to the lesion. The proximal small bowel is decompressed. The mass is likely in proximity to the ampulla. No abdominal free air. Trace right pararenal space fluid similar to that in April. Vascular/Lymphatic: Aortoiliac calcified atherosclerosis. The major arterial structures in the abdomen and pelvis are patent. The main portal vein is patent, although there is chronic narrowing of the junction of the main portal vein and SMV. The SMV is patent. No lymphadenopathy. Reproductive: Negative. Other: No pelvic free fluid. Musculoskeletal: No acute osseous abnormality identified. IMPRESSION: 1. Abnormal pancreatic head and proximal duodenum with an appearance most suggestive of an obstructing pseudocyst measuring 3.1 cm. This has progressed since 10/31/2017 and now results in high-grade impingement on the lumen of the 2nd portion of the duodenum. Endoscopy may be most valuable at this point. 2. Chronic but increased intrahepatic biliary ductal dilatation. Gallbladder distention without CT evidence of acute cholecystitis. 3. Chronic calcific pancreatitis with no acute pancreatic inflammation today. No peripancreatic lymphadenopathy. 4. Surgically absent spleen. 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 5. Chronic lower lobe lung disease. 6.  Aortic Atherosclerosis (ICD10-I70.0). Electronically Signed   By: Genevie Ann M.D.   On: 03/11/2018 14:22   US Abdomen Limited Ruq  Result Date: 03/11/2018 CLINICAL DATA:  Initial evaluation for pseudocyst of pancreas. EXAM: ULTRASOUND ABDOMEN LIMITED RIGHT UPPER QUADRANT COMPARISON:  Prior CT from earlier the same day. FINDINGS: Gallbladder: No gallstones or wall  thickening visualized. No sonographic Murphy sign noted by sonographer. Common bile duct: Diameter: 3.5 mm.  Mild intrahepatic biliary dilatation noted. Liver: No focal lesion identified. Within normal limits in parenchymal echogenicity. Portal vein is patent on color Doppler imaging with normal direction of blood flow towards the liver. Known pancreatic pseudocyst not visualized. IMPRESSION: 1. Normal sonographic evaluation of the gallbladder. No cholelithiasis or evidence for acute cholecystitis. 2. Mild intrahepatic biliary dilatation. CBD within normal limits measuring 3.5 mm. Electronically Signed   By: Jeannine Boga M.D.   On: 03/11/2018 23:32    Patriciaann Clan, DO 03/12/2018, 5:52 AM PGY-1, [facility] Intern pager: [phone], text pages welcome

## 2018-03-12 NOTE — Consult Note (Addendum)
[facility] Consult: 11:51 AM 03/12/2018  LOS: 1 day    Referring Provider: Dr Owens Shark.    Primary Care Physician:  [name], DO Primary Gastroenterologist: Dr Loletha Carrow.     Reason for Consultation: Obstructing pancreatic pseudocyst.   HPI: Timothy Martinez is a 67 y.o. male.  History A. fib, on Xarelto. DM 2, not on insulin S/p splenectomy.   Admitted in April 2019 with acute on chronic pancreatitis.  Patient had chronic pancreatitis with intermittent flares of acute pain for at least 10 years.  Said he quit drinking 04/2017.  Seen by Dr Lyndel Safe but never fup in office.  Pt refused suggest MRCP, refused ERCP EUS.    Patient's PMD arranged GI office visit (not colonoscopy ) with Dr. Wilfrid Lund on 04/24/18 for blood detected in the stool on home based FOBT.  Patient has not seen any recent frank blood per rectum.  He has had CT scans in:    October 2018 (acute pancreatitis with inflammation at D2, 6 mm RML lung nodule need fup CT 6-12 months)  February 2019 (Mild acute pancreatitis, which is decreased compared to previous study. Underlying changes of chronic pancreatitis stable) April 2019  Worsening acute pancreatitis.  Considerable surrounding edema/exudate and markedly dilated pancreatic duct. There may be mild edema and slight dilatation of the fourth portion of the duodenum.    He is readmitted with N/V Last Tuesday he started experiencing mild lower abdominal pain.  Wednesday night and ever since then he has had postprandial nausea vomiting no bloody or coffee-ground emesis.  Every time he tries to eat solids he vomits.  He can tolerate water.  Sometimes he has dry heaves.  His last bowel movement was on 03/09/18, it was normal and brown. Patient was started on 3 times daily gabapentin after seeing his  primary MD on 7/29.  It was also suggested that he start drinking 2 Ensure a day.  He had lost from 180#to about 158#for several months.  Patient takes Pepcid at home chronically.  He did not have any antinausea pills to take at home.  Last drink beer in June 2019  CT 03/11/18:  Abnormal pancreatic head and proximal duodenum with an appearance most suggestive of an obstructing pseudocyst measuring 3.1 cm. This has progressed since 10/31/2017 and now results in high-grade impingement on the lumen of the 2nd portion of the duodenum. Chronic but increased intrahepatic biliary ductal dilatation.  Chronic calcific pancreatitis with no acute pancreatic inflammation today. No peripancreatic lymphadenopathy. Gallbladder distention without CT evidence of acute cholecystitis.  03/11/18 Abdominal ultrasound: Mild intrahepatic biliary dilatation. CBD within normal limits measuring 3.5 mm.  Pseudocyst not seen.  PV patent, flow normodirectional.     Lipase 1526.  T bili 0.9 >> 1.3.  Alk phos 157 >> 143.  AST/ALT 111/121 >> 55/82.  WBC 14 K.  Normal Hgb and platelets. Normal coags.       Past Medical History:  Diagnosis Date  . Acute on chronic pancreatitis ([place]) 05/17/2016  . Alcohol abuse   . Alcoholic pancreatitis   .  Anginal pain ([place])   . Anxiety and depression 01/20/2015   PHQ 9 = 11 (01/20/15)    . Arthritis    "eat up w/it" (05/17/2016)  . Atrial fibrillation status post cardioversion ([place]) 08/2014  . Atrial flutter, unspecified   . CHF (congestive heart failure) ([place])   . Chronic anticoagulation   . Chronic bronchitis ([place])   . COLD (chronic obstructive lung disease) ([place])   . Daily headache    "need eye examined" (05/17/2016)  . GERD (gastroesophageal reflux disease)   . Hematuria, microscopic 02/13/2015   Noted on UA - Recheck UA at next visit ~ 1 months   . Hyperlipidemia   . Hypertension   . Insomnia 02/11/2016  . Lateral epicondylitis of right elbow 03/17/2017  . Left foot pain  10/12/2013  . Low back pain 12/21/2012  . Malnutrition of moderate degree 05/18/2016  . Neck pain, bilateral posterior 10/15/2014  . Neuropathy 03/16/2015  . Pain of right upper extremity 05/30/2016  . Pancreatic pseudocyst    seen on CT scan 07/2014  . Pancreatitis 08/04/2014  . Permanent atrial fibrillation ([place]) 05/05/2017  . Pneumonia    "?a few times" (05/17/2016)  . Tobacco abuse 05/30/2011   Heavy smoker up to 3 ppd down to 15 cigs per day in 09/2014   . Type II diabetes mellitus ([place])     Past Surgical History:  Procedure Laterality Date  . BACK SURGERY    . CARDIOVERSION N/A 09/22/2014   Procedure: CARDIOVERSION;  Surgeon: Thayer Headings, MD;  Location: [facility] ENDOSCOPY;  Service: Cardiovascular;  Laterality: N/A;  . NECK SURGERY  1974   not cervical, states had lesion on neck which was removed  . POSTERIOR LAMINECTOMY / DECOMPRESSION LUMBAR SPINE  10/2006   of L4, L5 and S1 with a 5-1 diskectomy, microdissection with the microscope/notes 10/24/2006  . SPLENECTOMY  2003    Prior to Admission medications   Medication Sig Start Date End Date Taking? Authorizing Provider  albuterol (PROVENTIL HFA;VENTOLIN HFA) 108 (90 Base) MCG/ACT inhaler Inhale 1-2 puffs into the lungs every 6 (six) hours as needed for wheezing or shortness of breath. 11/14/17  Yes Lockamy, Timothy, DO  amitriptyline (ELAVIL) 10 MG tablet TAKE 1 TABLET BY MOUTH EVERYDAY AT BEDTIME Patient taking differently: Take 10 mg by mouth at bedtime.  02/19/18  Yes Lockamy, Timothy, DO  atorvastatin (LIPITOR) 40 MG tablet Take 1 tablet (40 mg total) by mouth daily at 6 PM. 02/19/18  Yes Lockamy, Timothy, DO  cyanocobalamin 500 MCG tablet Take 500 mcg by mouth daily.   Yes [provider]  famotidine (PEPCID) 40 MG tablet Take 1 tablet (40 mg total) by mouth every other day. 05/19/17  Yes Eloise Levels, MD  gabapentin (NEURONTIN) 100 MG capsule Take 1 capsule (100 mg total) by mouth 3 (three) times daily. 02/19/18  Yes  [name], DO  metFORMIN (GLUCOPHAGE) 1000 MG tablet Take 1 tablet (1,000 mg total) by mouth 2 (two) times daily with a meal. 02/19/18  Yes Lockamy, Timothy, DO  metoprolol tartrate (LOPRESSOR) 50 MG tablet Take 1.5 tablets (75 mg total) by mouth 2 (two) times daily. 02/19/18  Yes Lockamy, Timothy, DO  mometasone-formoterol (DULERA) 200-5 MCG/ACT AERO Inhale 2 puffs into the lungs 2 (two) times daily. 06/13/17  Yes Eloise Levels, MD  nicotine (NICODERM CQ - DOSED IN MG/24 HR) 7 mg/24hr patch Place 1 patch (7 mg total) onto the skin daily. Patient taking differently: Place 7 mg onto the  skin daily as needed (nicotine withdrawl).  11/02/17  Yes Rai, Ripudeep K, MD  ondansetron (ZOFRAN) 4 MG tablet Take 1 tablet (4 mg total) by mouth every 8 (eight) hours as needed for nausea or vomiting. 11/02/17  Yes Rai, Ripudeep K, MD  rivaroxaban (XARELTO) 20 MG TABS tablet Take 1 tablet (20 mg total) by mouth daily with supper. 02/19/18  Yes Lockamy, Timothy, DO  Blood Glucose Monitoring Suppl (ONE TOUCH ULTRA 2) W/DEVICE KIT Dispense one glucometer. Diagnosis Type 2 Diabetes. E11.9 09/12/14   Willeen Niece, MD  glucose blood (ONE TOUCH ULTRA TEST) test strip Use check fasting blood glucose qAM. Type 2 Diabetes E11.9 09/12/14   Willeen Niece, MD  ONE TOUCH LANCETS MISC Check fasting blood glucose qAM. Type 2 DM. E11.9 09/12/14   Willeen Niece, MD    Scheduled Meds: . dextrose      . folic acid  1 mg Oral Daily  . insulin aspart  0-9 Units Subcutaneous TID WC  . metoprolol tartrate  75 mg Oral BID  . mometasone-formoterol  2 puff Inhalation BID  . multivitamin with minerals  1 tablet Oral Daily  . thiamine  100 mg Oral Daily   Or  . thiamine  100 mg Intravenous Daily   Infusions: . sodium chloride Stopped (03/12/18 1044)  . dextrose 5 % and 0.45% NaCl 125 mL/hr at 03/12/18 0953   PRN Meds: acetaminophen **OR** acetaminophen, albuterol, HYDROmorphone (DILAUDID) injection, ondansetron **OR**  ondansetron (ZOFRAN) IV, polyethylene glycol   Allergies as of 03/11/2018 - Review Complete 03/11/2018  Allergen Reaction Noted  . Prozac [fluoxetine hcl] Nausea Only 03/27/2015  . Morphine and related Swelling 05/28/2011  . Penicillins Rash 05/28/2011    Family History  Problem Relation Age of Onset  . Diabetes Mother   . Aneurysm Mother   . Stroke Mother   . Hypertension Mother   . Alcohol abuse Father   . Cancer Father        Lung  . Cancer Sister        Lung Cancer  . Post-traumatic stress disorder Brother   . Heart attack Neg Hx     Social History   Socioeconomic History  . Marital status: Single    Spouse name: Not on file  . Number of children: Not on file  . Years of education: Not on file  . Highest education level: Not on file  Occupational History  . Not on file  Social Needs  . Financial resource strain: Not on file  . Food insecurity:    Worry: Not on file    Inability: Not on file  . Transportation needs:    Medical: Not on file    Non-medical: Not on file  Tobacco Use  . Smoking status: Current Every Day Smoker    Packs/day: 1.00    Years: 52.00    Pack years: 52.00    Types: Cigarettes    Start date: 04/09/1964  . Smokeless tobacco: Never Used  . Tobacco comment: down from 2.5 ppd  Substance and Sexual Activity  . Alcohol use: Yes    Comment: drinking 6 beers per week  . Drug use: No  . Sexual activity: Not Currently    Birth control/protection: Condom  Lifestyle  . Physical activity:    Days per week: Not on file    Minutes per session: Not on file  . Stress: Not on file  Relationships  . Social connections:    Talks on phone:  Not on file    Gets together: Not on file    Attends religious service: Not on file    Active member of club or organization: Not on file    Attends meetings of clubs or organizations: Not on file    Relationship status: Not on file  . Intimate partner violence:    Fear of current or ex partner: Not on file     Emotionally abused: Not on file    Physically abused: Not on file    Forced sexual activity: Not on file  Other Topics Concern  . 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     Admits to alcohol abuse in past- no longer drinking- last drink 11/2.     REVIEW OF SYSTEMS: Constitutional: Feels somewhat weak and tired.  This is not profound. ENT:  No nose bleeds Pulm: Occasional, mostly nonproductive, cough.  No new dyspnea on exertion. CV:  No palpitations, no LE edema.  GU:  No hematuria, no frequency GI:  Per HPI Heme: Denies unusual bleeding or bruising. Transfusions: None Neuro: Chronic back pain no headaches, no peripheral tingling or numbness Derm:  No itching, no rash or sores.  Endocrine:  No sweats or chills.  No polyuria or dysuria Immunization: Reviewed. Travel:  None beyond local counties in last few months.    PHYSICAL EXAM: Vital signs in last 24 hours: Vitals:   03/12/18 0847 03/12/18 0949  BP:  122/71  Pulse:  88  Resp:    Temp:  97.8 F (36.6 C)  SpO2: 96%    Wt Readings from Last 3 Encounters:  03/11/18 72 kg  02/19/18 71.8 kg  11/14/17 74.3 kg    General: Pleasant, comfortable, cooperative WM.  Very suntanned. Head: No facial asymmetry or swelling.  No signs of head trauma. Eyes: No conjunctival pallor.  No scleral icterus.  EOMI. Ears: Not HOH. Nose: No congestion or discharge. Mouth: Upper,  dentures in place.  Oral mucosa pink, moist, clear.  Midline. Neck: JVD, no masses, no thyromegaly Lungs: Clear lungs bilaterally.  No dyspnea, no cough.  Voice a bit raspy. Heart: RRR.  No MRG.  S1, S2 present Abdomen: Soft.  Somewhat tender in the upper abdomen bilaterally.  No obvious mass, .   Rectal: Deferred Musc/Skeltl: No joint redness, swelling or gross deformity. Extremities: No CCE. Neurologic: Oriented x3.  Fully alert.  Good historian..  Full strength in  all 4 limbs. Skin: No rashes, no sores.  Very suntanned. Nodes: No cervical or inguinal adenopathy. Psych: Calm, pleasant, cooperative.  Intake/Output from previous day: 08/18 0701 - 08/19 0700 In: 1050.4 [IV Piggyback:1050.4] Out: -  Intake/Output this shift: No intake/output data recorded.  LAB RESULTS: Recent Labs    03/11/18 0950 03/12/18 0528  WBC 8.4 14.0*  HGB 15.3 14.5  HCT 47.8 45.1  PLT 323 281   BMET Lab Results  Component Value Date   NA 138 03/12/2018   NA 137 03/11/2018   NA 141 11/14/2017   K 5.0 03/12/2018   K 5.1 03/11/2018   K 5.2 11/14/2017   CL 106 03/12/2018   CL 104 03/11/2018   CL 101 11/14/2017   CO2 24 03/12/2018   CO2 27 03/11/2018   CO2 25 11/14/2017   GLUCOSE 74 03/12/2018   GLUCOSE 125 (H) 03/11/2018   GLUCOSE 102 (H) 11/14/2017   BUN 9 03/12/2018  BUN 11 03/11/2018   BUN 9 11/14/2017   CREATININE 0.83 03/12/2018   CREATININE 0.83 03/11/2018   CREATININE 0.70 (L) 11/14/2017   CALCIUM 9.3 03/12/2018   CALCIUM 9.5 03/11/2018   CALCIUM 9.6 11/14/2017   LFT Recent Labs    03/11/18 0950 03/12/18 0528  PROT 7.9 7.4  ALBUMIN 3.8 3.5  AST 111* 55*  ALT 121* 82*  ALKPHOS 157* 143*  BILITOT 0.9 1.3*   PT/INR Lab Results  Component Value Date   INR 1.24 05/04/2017   INR 1.44 05/17/2016   INR 1.10 01/14/2015   Hepatitis Panel No results for input(s): HEPBSAG, HCVAB, HEPAIGM, HEPBIGM in the last 72 hours. C-Diff No components found for: CDIFF Lipase     Component Value Date/Time   LIPASE 1,526 (H) 03/11/2018 0950    Drugs of Abuse     Component Value Date/Time   LABOPIA POSITIVE (A) 01/14/2015 1305   COCAINSCRNUR NONE DETECTED 01/14/2015 1305   LABBENZ NONE DETECTED 01/14/2015 1305   AMPHETMU NONE DETECTED 01/14/2015 1305   THCU NONE DETECTED 01/14/2015 1305   LABBARB NONE DETECTED 01/14/2015 1305     RADIOLOGY STUDIES: Ct Abdomen Pelvis W Contrast  Result Date: 03/11/2018 CLINICAL DATA:  67 year old male  with abdominal distention. Nausea vomiting for 3 days. EXAM: CT ABDOMEN AND PELVIS WITH CONTRAST TECHNIQUE: Multidetector CT imaging of the abdomen and pelvis was performed using the standard protocol following bolus administration of intravenous contrast. CONTRAST:  150m ISOVUE-300 IOPAMIDOL (ISOVUE-300) INJECTION 61% COMPARISON:  CT Abdomen and Pelvis 10/31/2017 and earlier. FINDINGS: Lower chest: Chronic reticular opacity in both lower lobes. No acute pulmonary opacity, pleural or pericardial effusion. Hepatobiliary: The gallbladder is more distended. No pericholecystic fluid. Central biliary and intrahepatic ductal dilatation is chronic but increased (series 3, image 18). No discrete liver lesion. Pancreas: Rim enhancing cystic abnormality at the pancreatic head and uncinate further detailed below. Underlying chronic calcific pancreatitis with progressive dystrophic calcification of the pancreatic head and uncinate since April. Chronic dilated main pancreatic duct and parenchymal atrophy. The acute peripancreatic inflammation seen in April has largely resolved. Spleen: Surgically absent as before. Adrenals/Urinary Tract: Normal adrenal glands. Bilateral renal enhancement is stable and within normal limits. On the delayed images there is less renal contrast excretion today, persistent nephrogram. No hydronephrosis or proximal hydroureter. Diminutive and unremarkable urinary bladder. Stomach/Bowel: Gas distended rectum. Redundant sigmoid colon with mild retained gas and stool. Mild diverticulosis. Similar mild retained stool in the descending colon, with retained low-density stool throughout the transverse colon. Similar mild retained stool in the right colon. Negative terminal ileum. Normal appendix (coronal image 48). Flocculated material in distal small bowel loops. No dilated small bowel. The distal stomach and duodenum bulb are dilated with mild wall thickening and hyperenhancement. The pyloric channel is  suspected on series 3, image 23 and is open. There is mild periduodenal inflammation, and the 2nd portion of the duodenum appears obstructed by an irregular cystic mass with simple fluid density encompassing 18 x 29 by 31 millimeters (AP by transverse by CC). There is enhancing wall thickening around the mass, which may have been partially developed in April but less obstructing at that time. Superimposed chronic calcific pancreatitis with severe chronic dilatation of the pancreatic duct in the body and tail. The mass and both the upstream and downstream lumen are thought to be demonstrated on coronal image 40. The duodenum is decompressed distal to the lesion. The proximal small bowel is decompressed. The mass is likely in proximity to  the ampulla. No abdominal free air. Trace right pararenal space fluid similar to that in April. Vascular/Lymphatic: Aortoiliac calcified atherosclerosis. The major arterial structures in the abdomen and pelvis are patent. The main portal vein is patent, although there is chronic narrowing of the junction of the main portal vein and SMV. The SMV is patent. No lymphadenopathy. Reproductive: Negative. Other: No pelvic free fluid. Musculoskeletal: No acute osseous abnormality identified. IMPRESSION: 1. Abnormal pancreatic head and proximal duodenum with an appearance most suggestive of an obstructing pseudocyst measuring 3.1 cm. This has progressed since 10/31/2017 and now results in high-grade impingement on the lumen of the 2nd portion of the duodenum. Endoscopy may be most valuable at this point. 2. Chronic but increased intrahepatic biliary ductal dilatation. Gallbladder distention without CT evidence of acute cholecystitis. 3. Chronic calcific pancreatitis with no acute pancreatic inflammation today. No peripancreatic lymphadenopathy. 4. Surgically absent spleen. 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 5. Chronic lower lobe lung  disease. 6.  Aortic Atherosclerosis (ICD10-I70.0). Electronically Signed   By: Genevie Ann M.D.   On: 03/11/2018 14:22   US Abdomen Limited Ruq  Result Date: 03/11/2018 CLINICAL DATA:  Initial evaluation for pseudocyst of pancreas. EXAM: ULTRASOUND ABDOMEN LIMITED RIGHT UPPER QUADRANT COMPARISON:  Prior CT from earlier the same day. FINDINGS: Gallbladder: No gallstones or wall thickening visualized. No sonographic Murphy sign noted by sonographer. Common bile duct: Diameter: 3.5 mm.  Mild intrahepatic biliary dilatation noted. Liver: No focal lesion identified. Within normal limits in parenchymal echogenicity. Portal vein is patent on color Doppler imaging with normal direction of blood flow towards the liver. Known pancreatic pseudocyst not visualized. IMPRESSION: 1. Normal sonographic evaluation of the gallbladder. No cholelithiasis or evidence for acute cholecystitis. 2. Mild intrahepatic biliary dilatation. CBD within normal limits measuring 3.5 mm. Electronically Signed   By: Jeannine Boga M.D.   On: 03/11/2018 23:32     IMPRESSION:   *   Pancreatic pseudocyst causing duodenal obstruction.  Presented with N/V.    *   Acute on chronic pancreatitis.  Lipase 1526.  no acute pancreatitis per CT.    *   Chronic Xarelto.  Last dose 8/18 in PM.  On hold  *   FOBT positive on annual home-based screening.  Has appointment set up to see Dr. Loletha Carrow 04/24/2018 in the office   PLAN:     *  Per Dr Rush Landmark.    *   Ok to have full liquids now.  Was tolerating liquids at home.  Add Ensure BID Calorie counts.  *  fup LFTs, CBC in AM.      *   Added once daily oral Protonix.   Azucena Freed  03/12/2018, 11:51 AM Phone [phone]

## 2018-03-12 NOTE — Progress Notes (Signed)
Hypoglycemic Event  CBG: 66  Treatment: D50 IV 25 mL  Symptoms: None  Follow-up CBG: Time:0600 CBG Result:112  Possible Reasons for Event: Vomiting and Other: NPO  Comments/MD notified:NPO    [name]

## 2018-03-12 NOTE — Progress Notes (Signed)
Hypoglycemic Event  CBG: 70  Treatment: D50 IV 25 mL  Symptoms: None  Follow-up CBG: [id number] CBG Result:145  Possible Reasons for Event: Other: NPO  Comments/MD notified:PRotocol in [place]

## 2018-03-13 ENCOUNTER — Other Ambulatory Visit: Payer: Self-pay | Admitting: Family Medicine

## 2018-03-13 DIAGNOSIS — Z7901 Long term (current) use of anticoagulants: Secondary | ICD-10-CM

## 2018-03-13 DIAGNOSIS — K852 Alcohol induced acute pancreatitis without necrosis or infection: Secondary | ICD-10-CM

## 2018-03-13 DIAGNOSIS — R195 Other fecal abnormalities: Secondary | ICD-10-CM

## 2018-03-13 LAB — CBC WITH DIFFERENTIAL/PLATELET
Abs Immature Granulocytes: 0.1 10*3/uL (ref 0.0–0.1)
Basophils Absolute: 0.1 10*3/uL (ref 0.0–0.1)
Basophils Relative: 1 %
Eosinophils Absolute: 0.2 10*3/uL (ref 0.0–0.7)
Eosinophils Relative: 1 %
HCT: 48 % (ref 39.0–52.0)
Hemoglobin: 15.3 g/dL (ref 13.0–17.0)
Immature Granulocytes: 0 %
Lymphocytes Relative: 15 %
Lymphs Abs: 2.2 10*3/uL (ref 0.7–4.0)
MCH: 30.1 pg (ref 26.0–34.0)
MCHC: 31.9 g/dL (ref 30.0–36.0)
MCV: 94.5 fL (ref 78.0–100.0)
Monocytes Absolute: 1.6 10*3/uL — ABNORMAL HIGH (ref 0.1–1.0)
Monocytes Relative: 12 %
Neutro Abs: 10 10*3/uL — ABNORMAL HIGH (ref 1.7–7.7)
Neutrophils Relative %: 71 %
Platelets: 335 10*3/uL (ref 150–400)
RBC: 5.08 MIL/uL (ref 4.22–5.81)
RDW: 14.2 % (ref 11.5–15.5)
WBC: 14.2 10*3/uL — ABNORMAL HIGH (ref 4.0–10.5)

## 2018-03-13 LAB — COMPREHENSIVE METABOLIC PANEL
ALT: 66 U/L — ABNORMAL HIGH (ref 0–44)
AST: 40 U/L (ref 15–41)
Albumin: 3.6 g/dL (ref 3.5–5.0)
Alkaline Phosphatase: 146 U/L — ABNORMAL HIGH (ref 38–126)
Anion gap: 7 (ref 5–15)
BUN: 8 mg/dL (ref 8–23)
CO2: 35 mmol/L — ABNORMAL HIGH (ref 22–32)
Calcium: 9.6 mg/dL (ref 8.9–10.3)
Chloride: 98 mmol/L (ref 98–111)
Creatinine, Ser: 0.94 mg/dL (ref 0.61–1.24)
GFR calc Af Amer: >60 mL/min (ref >60)
GFR calc non Af Amer: >60 mL/min (ref >60)
Glucose, Bld: 123 mg/dL — ABNORMAL HIGH (ref 70–99)
Potassium: 3.7 mmol/L (ref 3.5–5.1)
Sodium: 140 mmol/L (ref 135–145)
Total Bilirubin: 0.9 mg/dL (ref 0.3–1.2)
Total Protein: 7.7 g/dL (ref 6.5–8.1)

## 2018-03-13 LAB — GLUCOSE, CAPILLARY
Glucose-Capillary: 127 mg/dL — ABNORMAL HIGH (ref 70–99)
Glucose-Capillary: 104 mg/dL — ABNORMAL HIGH (ref 70–99)
Glucose-Capillary: 152 mg/dL — ABNORMAL HIGH (ref 70–99)
Glucose-Capillary: 119 mg/dL — ABNORMAL HIGH (ref 70–99)

## 2018-03-13 LAB — CALCIUM, IONIZED: Calcium, Ionized, Serum: 5.1 mg/dL (ref 4.5–5.6)

## 2018-03-13 MED ORDER — OXYCODONE HCL 5 MG PO TABS
2.5000 mg | ORAL_TABLET | ORAL | Status: DC | PRN
  Administered 2018-03-13 – 2018-03-14 (×4): 2.5 mg via ORAL
  Filled 2018-03-13 (×4): qty 1

## 2018-03-13 NOTE — Progress Notes (Signed)
Daily Rounding Note  03/13/2018, 3:29 PM  LOS: 2 days   SUBJECTIVE:   Nagging, moderate pain in the right upper quadrant continues but is well relieved by small dose Dilaudid.  Tolerating full liquid diet but would really like to be able to eat solid food.  He is been taking it slowly and not eating a whole lot at once.  No nausea.   OBJECTIVE:         Vital signs in last 24 hours:    Temp:  [97.8 F (36.6 C)-97.9 F (36.6 C)] 97.8 F (36.6 C) (08/20 0537) Pulse Rate:  [72-79] 79 (08/20 0537) Resp:  [16] 16 (08/20 0537) BP: (124-131)/(76-81) 124/76 (08/20 0537) SpO2:  [94 %-96 %] 95 % (08/20 0839) Last BM Date: 03/10/18 Filed Weights   03/11/18 0947 03/11/18 1636  Weight: 71.7 kg 72 kg   General: Pleasant.  Comfortable.  Not acutely ill looking. Heart: RRR. Chest: Clear bilaterally.  No labored breathing or cough. Abdomen: Soft.  Minor tenderness in the right upper quadrant which is focal.  No guarding or rebound.  Active bowel sounds. Extremities: No CCE. Neuro/Psych: Alert.  Oriented x3.  No tremors, no limb weakness.  No gross deficits.  Intake/Output from previous day: 08/19 0701 - 08/20 0700 In: 2081.7 [P.O.:240; I.V.:1841.7] Out: 325 [Urine:325]  Intake/Output this shift: Total I/O In: 600 [P.O.:600] Out: -   Lab Results: Recent Labs    03/11/18 0950 03/12/18 0528 03/13/18 0642  WBC 8.4 14.0* 14.2*  HGB 15.3 14.5 15.3  HCT 47.8 45.1 48.0  PLT 323 281 335   BMET Recent Labs    03/11/18 0950 03/12/18 0528 03/13/18 0642  NA 137 138 140  K 5.1 5.0 3.7  CL 104 106 98  CO2 27 24 35*  GLUCOSE 125* 74 123*  BUN 11 9 8   CREATININE 0.83 0.83 0.94  CALCIUM 9.5 9.3 9.6   LFT Recent Labs    03/11/18 0950 03/12/18 0528 03/13/18 0642  PROT 7.9 7.4 7.7  ALBUMIN 3.8 3.5 3.6  AST 111* 55* 40  ALT 121* 82* 66*  ALKPHOS 157* 143* 146*  BILITOT 0.9 1.3* 0.9   PT/INR No results for input(s):  LABPROT, INR in the last 72 hours. Hepatitis Panel No results for input(s): HEPBSAG, HCVAB, HEPAIGM, HEPBIGM in the last 72 hours.  Studies/Results: US Abdomen Limited Ruq  Result Date: 03/11/2018 CLINICAL DATA:  Initial evaluation for pseudocyst of pancreas. EXAM: ULTRASOUND ABDOMEN LIMITED RIGHT UPPER QUADRANT COMPARISON:  Prior CT from earlier the same day. FINDINGS: Gallbladder: No gallstones or wall thickening visualized. No sonographic Murphy sign noted by sonographer. Common bile duct: Diameter: 3.5 mm.  Mild intrahepatic biliary dilatation noted. Liver: No focal lesion identified. Within normal limits in parenchymal echogenicity. Portal vein is patent on color Doppler imaging with normal direction of blood flow towards the liver. Known pancreatic pseudocyst not visualized. IMPRESSION: 1. Normal sonographic evaluation of the gallbladder. No cholelithiasis or evidence for acute cholecystitis. 2. Mild intrahepatic biliary dilatation. CBD within normal limits measuring 3.5 mm. Electronically Signed   By: Jeannine Boga M.D.   On: 03/11/2018 23:32   Scheduled Meds: . feeding supplement (ENSURE ENLIVE)  237 mL Oral BID BM  . folic acid  1 mg Oral Daily  . insulin aspart  0-9 Units Subcutaneous TID WC  . metoprolol tartrate  75 mg Oral BID  . mometasone-formoterol  2 puff Inhalation BID  . multivitamin with minerals  1 tablet Oral Daily  . pantoprazole  40 mg Oral Q0600  . thiamine  100 mg Oral Daily   Or  . thiamine  100 mg Intravenous Daily   Continuous Infusions: PRN Meds:.acetaminophen **OR** acetaminophen, albuterol, ondansetron **OR** ondansetron (ZOFRAN) IV, oxyCODONE, polyethylene glycol   ASSESMENT:   *  Pancreatic pseudocyst and associated duodenal obstruction. WBCs remain elevated. Lipase has not been rechecked, LFTs however are improved. Itching thus far includes CT scan 8/18 with contrast revealed the obstructing pseudocyst, increased chronic intrahepatic biliary  ductal dilatation, chronic calcific pancreatitis.  *    Acute on chronic pancreatitis.  *   Chronic Xarelto.  On hold.  Last dose p.m. 8/18.  *   FOBT+ on recent home-based screening.  Appointment 04/24/2018 to see Dr. Loletha Carrow in the office.   PLAN   *  EUS, ? Cyst aspiration, timing per Dr Rush Landmark.     *   Repeat lipase in the morning.    Timothy Martinez  03/13/2018, 3:29 PM Phone [phone]

## 2018-03-13 NOTE — Progress Notes (Deleted)
Family Medicine Teaching Service Daily Progress Note Intern Pager: [phone]  Patient name: Timothy Martinez Medical record number: [id number] Date of birth: 1951-04-21 Age: 67 y.o. Gender: male  Primary Care Provider: [name], DO Consultants: GI Code Status: Full   Assessment and Plan: Timothy Martinez a 67 y.o.malepresenting with N/V x3 days, found to have acute on chronic pancreatitis with obstructing pancreatic pseudocyst. PMH is significant forchronic pancreatitis, type 2 diabetes, COPD, chronic atrial fibrillation, GERD, hyperlipidemia, chronic lower back pain with sciatica, solitary pulmonary nodule, and tobacco use.   Acute onchronicpancreatitis with duodenum obstructing pseudocyst:Pt had one bout of emesis last night, following multiple cups of ice cream, and has not eaten since. On exam, he is hemodynamically stable and no longer has tenderness with palpation of his abdomen. WBC 14.2 today, from 14. AST/ALT trending down at 40 and 66, Alk Phos 146. Bilirubin 0.9 today. GI has evaluated the patient, and will discuss further interventions with patient for potentially Wednesday.  -GI recommendations as below:  -Cont liquid diet as tolerated  -Cont protonix 12m daily started on 8/19  -mIVF KVO  -Zofran 476mas needed  -Pain control: tylenol for mild and transition to oxycodone 66m3m 4 PRN mod-severe  -monitor CMP and CBC   Type 2 Diabetes: A1c 6.1 in 01/2018.Glucose 100-170 through yesterday. Takes metformin 1000m51mD at home.  -sSSI -Monitor glucose q4 -Hold homemetformindue to potential intervention  ParoxysmalAtrial Fibrillations/p Cardioversion:Stable, [facility].Appears to be in sinus rhythm on examand israte controlled. S/p Cardioversion in 08/2014.Takes metoprolol 50 mg BID and Xarelto 20 mg at home.  -EKG 8/19 NSR  -Cont home metoprolol 50mg581m -home Xarelto 20mg 86m after 8/18 dose for possible intervention    Recent Weight loss: Pt endorses a 20 lb weight loss with night sweats in the past approximately 6 months, despite his normal diet and activity. His PCP has recently recommended adding ensure BID with meals for weight gain. Concern for possible underlying lung vs GI malignancy, however likely chronic pancreatitis is contributing. He is already scheduled to see GI in 04/2018. Has not had a screening low dose CT chest scan in the past.  -Likely start pancreatitic enzymes once tolerating full diet  -Set up a screening low dose CT outpatient   -Monitor weight  COPD:Stable on RA, uses Dulera BID and albuterol as needed at home. However, has been using his albuterol daily for coughing and perceived SOB.He currently denies any SOB. Recommend initiating further controller medications, such as LAMA, outpatient.  -Cont home dulera BID  -Albuterol as needed for SOB   Chronic lower back pain with sciatica:Stable, complains of his back pain this am. Does not use any chronic pain medications, and stopped taking his home gabapentin.  -Hold home gabapentin -has oxycodone PRN for pancreatitis pain, as above -Cont KPad  GERD: Stable, not endorsing symptoms currently.  -Holding home famotidine 40 mg qod d/t; started on PPI on 8/19   Hyperlipidemia: Lipid panel in 10/2017 wnl. Takes Atorvastatin 40 mg daily at home.  -Hold home atorvastatin d/t bowel rest, can consider restarting   Alcohol use:ReUQJ:FHLKTGYr h/o drinking beer, never any hard liquor. Last reporteddrink was 2 months ago.  -monitor onCIWA; 0-4 overnight (4 was due to N/V associated with presenting concerning)  -Folate, thiamine, and multivitamin per protocol   Tobacco Use:SmBWL:SLHTDSPD, offered nicotine patch, pt declined at admission.  History of solitary pulmonary nodule: Small stable 6mm no14me in the LL base last noted in 10/2017  CT abdomen, however not noted in 8/18 CT abdomen, possibly not high enough for  visualization.  -Cont to monitor   FEN/GI:Liquid diet, IV in place  Prophylaxis:homexareltoheld after pm dose 8/18; SCDs and frequent ambulation   Disposition: Continued inpatient care; Evaluation with GI   Subjective:  Patient doing well this morning.  Had one bout of emesis early last night, after eating several cups of ice cream.  He has not eaten since then, however wants some coffee and some soup. No BM since Friday.   Objective: Temp:  [97.8 F (36.6 C)-97.9 F (36.6 C)] 97.8 F (36.6 C) (08/20 0537) Pulse Rate:  [72-88] 79 (08/20 0537) Resp:  [16] 16 (08/20 0537) BP: (122-131)/(71-81) 124/76 (08/20 0537) SpO2:  [94 %-96 %] 96 % (08/20 0537) Physical Exam: General: Alert, NAD HEENT: NCAT, MMM, oropharynx nonerythematous  Cardiac: RRR no m/g/r Lungs: Clear bilaterally, no increased WOB  Abdomen: soft, non-distended, non-tender to palpation with normoactive BS.  Msk: Moves all extremities spontaneously  Ext: Warm, dry, 2+ distal pulses, no edema   Laboratory: Recent Labs  Lab 03/11/18 0950 03/12/18 0528  WBC 8.4 14.0*  HGB 15.3 14.5  HCT 47.8 45.1  PLT 323 281   Recent Labs  Lab 03/11/18 0950 03/12/18 0528  NA 137 138  K 5.1 5.0  CL 104 106  CO2 27 24  BUN 11 9  CREATININE 0.83 0.83  CALCIUM 9.5 9.3  PROT 7.9 7.4  BILITOT 0.9 1.3*  ALKPHOS 157* 143*  ALT 121* 82*  AST 111* 55*  GLUCOSE 125* 74     Imaging/Diagnostic Tests: No results found.  Patriciaann Clan, DO 03/13/2018, 5:54 AM PGY-1, [place] Intern pager: [phone], text pages welcome

## 2018-03-14 ENCOUNTER — Inpatient Hospital Stay (HOSPITAL_COMMUNITY): Payer: Medicare Other

## 2018-03-14 LAB — CBC WITH DIFFERENTIAL/PLATELET
Abs Immature Granulocytes: 0 10*3/uL (ref 0.0–0.1)
Basophils Absolute: 0.1 10*3/uL (ref 0.0–0.1)
Basophils Relative: 1 %
Eosinophils Absolute: 0.7 10*3/uL (ref 0.0–0.7)
Eosinophils Relative: 6 %
HCT: 44.8 % (ref 39.0–52.0)
Hemoglobin: 14.4 g/dL (ref 13.0–17.0)
Immature Granulocytes: 0 %
Lymphocytes Relative: 26 %
Lymphs Abs: 2.8 10*3/uL (ref 0.7–4.0)
MCH: 30 pg (ref 26.0–34.0)
MCHC: 32.1 g/dL (ref 30.0–36.0)
MCV: 93.3 fL (ref 78.0–100.0)
Monocytes Absolute: 1.4 10*3/uL — ABNORMAL HIGH (ref 0.1–1.0)
Monocytes Relative: 13 %
Neutro Abs: 5.9 10*3/uL (ref 1.7–7.7)
Neutrophils Relative %: 54 %
Platelets: 294 10*3/uL (ref 150–400)
RBC: 4.8 MIL/uL (ref 4.22–5.81)
RDW: 14 % (ref 11.5–15.5)
WBC: 10.8 10*3/uL — ABNORMAL HIGH (ref 4.0–10.5)

## 2018-03-14 LAB — LIPASE, BLOOD: Lipase: 169 U/L — ABNORMAL HIGH (ref 11–51)

## 2018-03-14 LAB — COMPREHENSIVE METABOLIC PANEL
ALT: 51 U/L — ABNORMAL HIGH (ref 0–44)
AST: 30 U/L (ref 15–41)
Albumin: 3.3 g/dL — ABNORMAL LOW (ref 3.5–5.0)
Alkaline Phosphatase: 133 U/L — ABNORMAL HIGH (ref 38–126)
Anion gap: 6 (ref 5–15)
BUN: 12 mg/dL (ref 8–23)
CO2: 29 mmol/L (ref 22–32)
Calcium: 9.2 mg/dL (ref 8.9–10.3)
Chloride: 103 mmol/L (ref 98–111)
Creatinine, Ser: 0.81 mg/dL (ref 0.61–1.24)
GFR calc Af Amer: >60 mL/min (ref >60)
GFR calc non Af Amer: >60 mL/min (ref >60)
Glucose, Bld: 120 mg/dL — ABNORMAL HIGH (ref 70–99)
Potassium: 3.8 mmol/L (ref 3.5–5.1)
Sodium: 138 mmol/L (ref 135–145)
Total Bilirubin: 0.8 mg/dL (ref 0.3–1.2)
Total Protein: 7.1 g/dL (ref 6.5–8.1)

## 2018-03-14 LAB — GLUCOSE, CAPILLARY
Glucose-Capillary: 101 mg/dL — ABNORMAL HIGH (ref 70–99)
Glucose-Capillary: 185 mg/dL — ABNORMAL HIGH (ref 70–99)
Glucose-Capillary: 99 mg/dL (ref 70–99)
Glucose-Capillary: 111 mg/dL — ABNORMAL HIGH (ref 70–99)
Glucose-Capillary: 162 mg/dL — ABNORMAL HIGH (ref 70–99)

## 2018-03-14 MED ORDER — BISACODYL 5 MG PO TBEC
5.0000 mg | DELAYED_RELEASE_TABLET | Freq: Once | ORAL | Status: AC
  Administered 2018-03-14: 5 mg via ORAL
  Filled 2018-03-14: qty 1

## 2018-03-14 MED ORDER — HEPARIN SODIUM (PORCINE) 5000 UNIT/ML IJ SOLN
5000.0000 [IU] | Freq: Three times a day (TID) | INTRAMUSCULAR | Status: AC
  Administered 2018-03-14 (×2): 5000 [IU] via SUBCUTANEOUS
  Filled 2018-03-14 (×2): qty 1

## 2018-03-14 MED ORDER — POLYETHYLENE GLYCOL 3350 17 G PO PACK
17.0000 g | PACK | Freq: Two times a day (BID) | ORAL | Status: DC
  Administered 2018-03-14 – 2018-03-18 (×6): 17 g via ORAL
  Filled 2018-03-14 (×7): qty 1

## 2018-03-14 MED ORDER — BISACODYL 5 MG PO TBEC
5.0000 mg | DELAYED_RELEASE_TABLET | Freq: Every day | ORAL | Status: DC
  Administered 2018-03-14 – 2018-03-18 (×5): 5 mg via ORAL
  Filled 2018-03-14 (×5): qty 1

## 2018-03-14 MED ORDER — GI COCKTAIL ~~LOC~~
30.0000 mL | Freq: Once | ORAL | Status: AC
  Administered 2018-03-14: 30 mL via ORAL
  Filled 2018-03-14: qty 30

## 2018-03-14 MED ORDER — POLYETHYLENE GLYCOL 3350 17 G PO PACK
17.0000 g | PACK | Freq: Once | ORAL | Status: AC
  Administered 2018-03-14: 17 g via ORAL
  Filled 2018-03-14: qty 1

## 2018-03-14 MED ORDER — OXYCODONE HCL 5 MG PO TABS
5.0000 mg | ORAL_TABLET | ORAL | Status: DC | PRN
  Administered 2018-03-14 – 2018-03-17 (×11): 5 mg via ORAL
  Filled 2018-03-14 (×11): qty 1

## 2018-03-14 MED ORDER — POLYETHYLENE GLYCOL 3350 17 G PO PACK
17.0000 g | PACK | Freq: Every day | ORAL | Status: DC
  Administered 2018-03-14: 17 g via ORAL
  Filled 2018-03-14: qty 1

## 2018-03-14 MED ORDER — GLYCERIN (LAXATIVE) 2.1 G RE SUPP
1.0000 | Freq: Once | RECTAL | Status: AC
  Administered 2018-03-14: 1 via RECTAL
  Filled 2018-03-14: qty 1

## 2018-03-14 NOTE — Progress Notes (Signed)
          Daily Rounding Note  03/14/2018, 12:57 PM  LOS: 3 days   SUBJECTIVE:   Chief complaint: bil abdominal pain after breakfast of oatmeal today.   Pain is similar to previous acute pancreatitis.   No nausea or vomiting.  Still wants to stay with solid foods.  Last BM was 6 days ago, does not want a laxative.        OBJECTIVE:         Vital signs in last 24 hours:    Temp:  [97.7 F (36.5 C)-98.2 F (36.8 C)] 97.7 F (36.5 C) (08/21 0423) Pulse Rate:  [69-79] 69 (08/21 0423) Resp:  [16] 16 (08/21 0423) BP: (107-124)/(62-75) 124/75 (08/21 0423) SpO2:  [94 %-96 %] 96 % (08/21 0423) Last BM Date: 03/09/18 Filed Weights   03/11/18 0947 03/11/18 1636  Weight: 71.7 kg 72 kg   General: looks comfortabl, alert, no acutely ill looking   Heart: rrr Chest: clear bil.  No cough or SOB Abdomen: thin, ND, active BS.  Tender without guarding bil from mid to upper abdomen.    Extremities: no CCE Neuro/Psych:  Oriented x 3.  No deficits.  Slightly anxious as before.    Intake/Output from previous day: 08/20 0701 - 08/21 0700 In: 960 [P.O.:960] Out: -   Intake/Output this shift: No intake/output data recorded.  Lab Results: Recent Labs    03/12/18 0528 03/13/18 0642 03/14/18 0444  WBC 14.0* 14.2* 10.8*  HGB 14.5 15.3 14.4  HCT 45.1 48.0 44.8  PLT 281 335 294   BMET Recent Labs    03/12/18 0528 03/13/18 0642 03/14/18 0444  NA 138 140 138  K 5.0 3.7 3.8  CL 106 98 103  CO2 24 35* 29  GLUCOSE 74 123* 120*  BUN 9 8 12   CREATININE 0.83 0.94 0.81  CALCIUM 9.3 9.6 9.2   LFT Recent Labs    03/12/18 0528 03/13/18 0642 03/14/18 0444  PROT 7.4 7.7 7.1  ALBUMIN 3.5 3.6 3.3*  AST 55* 40 30  ALT 82* 66* 51*  ALKPHOS 143* 146* 133*  BILITOT 1.3* 0.9 0.8   Scheduled Meds: . feeding supplement (ENSURE ENLIVE)  237 mL Oral BID BM  . folic acid  1 mg Oral Daily  . heparin injection (subcutaneous)  5,000 Units  Subcutaneous Q8H  . insulin aspart  0-9 Units Subcutaneous TID WC  . metoprolol tartrate  75 mg Oral BID  . mometasone-formoterol  2 puff Inhalation BID  . multivitamin with minerals  1 tablet Oral Daily  . pantoprazole  40 mg Oral Q0600  . polyethylene glycol  17 g Oral Daily  . thiamine  100 mg Oral Daily   Or  . thiamine  100 mg Intravenous Daily   Continuous Infusions: PRN Meds:.acetaminophen **OR** acetaminophen, albuterol, ondansetron **OR** ondansetron (ZOFRAN) IV, oxyCODONE   ASSESMENT:   *  Pancreatic pseudocyst Chronic pancreatitis due to ETOH.   Lipase, LFTs, WBCs continue to improve.  No fevers.  However is having fairly intense abd pain.    *   Chronic xarelto, on hold.  Currently on SQ Heparin.      PLAN   *  Supportive care.  Leave solid diet in place for now, may need to back off to liquids or PP tube feeds if continues to have pain after eating.   2 V abdomen.      Azucena Freed  03/14/2018, 12:57 PM Phone [phone]

## 2018-03-14 NOTE — Progress Notes (Signed)
Pt c/o of severe abd pain rating it a 12/10 and states the Oxycodone does not help.  Pt states "he cannot live like this" and is asking for the doctor to come see him.  MD notified.  Timothy Martinez

## 2018-03-14 NOTE — Progress Notes (Signed)
Family Medicine Teaching Service Daily Progress Note Intern Pager: [phone]  Patient name: Timothy Martinez Medical record number: [id number] Date of birth: 1950-12-13 Age: 67 y.o. Gender: male  Primary Care Provider: [name], DO Consultants: GI  Code Status: Full  Assessment and Plan: Timothy Norman Sluderis a 67 y.o.malepresenting with N/V x3 days, found to have acute on chronic pancreatitis with obstructing pancreatic pseudocyst. PMH is significant forchronic pancreatitis, type 2 diabetes, COPD, chronic atrial fibrillation, GERD, hyperlipidemia, chronic lower back pain with sciatica, solitary pulmonary nodule, and tobacco use.   Acute onchronicpancreatitiswithduodenumobstructing pseudocyst:Tolerated liquid diet well yesterday, wanting to have solid foods. No further N/V. On exam, he is hemodynamically stable and not tender to palpation. WBC trending down, 10.8 today from 14.2. AST/ALT trending down at 30 and 51, Alk Phos down to 133. Bilirubin wnl at 0.8 today. Lipase pending. GI is not recommending any further interventions today, will reevaluate into Thursday if any endoscopic procedures are necessary inpatient. He will require an ERCP at some point, however this may be done outpatient.  - GI following; appreciate recs - Continue full liquid, advance as tolerated - Continue Protonix 40 mg daily - KVO fluids -Zofran 4 mg as needed -Pain control: Tylenol for mild and oxycodone 2.5 mg every 4 PRN mod-severe -Monitor CMP and CBC in the a.m.  Type 2 Diabetes: A1c 6.1 in 01/2018.Glucose 150 on average.Takes metformin 1056m BID at home.  -sSSI -Monitor glucose q4 -Hold homemetformin  ParoxysmalAtrial Fibrillations/p Cardioversion:Stable, follows with [name].Appears to be in sinus rhythm on examand israte controlled. S/p Cardioversion in 08/2014.Takes metoprolol 50 mg BID and Xarelto 20 mg at home.  -EKG8/19 NSR  -Cont home metoprolol 554mBID -home  Xarelto 2019meld after 8/18 dose   Recent Weight loss: Pt endorses a 20 lb weight loss with night sweats in the past approximately 6 months, despite his normal diet and activity. His PCP has recently recommended adding ensure BID with meals for weight gain. Concern for possible underlying lung vs GI malignancy, however likely chronic pancreatitis is contributing. He is already scheduled to see GI in 04/2018. Has not had a screening low dose CT chest scan in the past.  -Likely start pancreatitic enzymes once tolerating full diet (creon)  -Set upa screening low dose CT outpatient  -Monitor weight  COPD:Stableon RA, uses Dulera BID and albuterol as needed at home. However, has been using his albuterol daily for coughing and perceived SOB.He currently denies any SOB. Recommend initiating further controller medications, such as LAMA, outpatient.  -Cont home dulera BID  -Albuterol as needed for SOB   Chronic lower back pain with sciatica:Stable,complains of his back pain this am. Does not use any chronic pain medications,and stopped taking his home gabapentin. -Hold home gabapentin -has oxycodone PRN for pancreatitis pain, as above -Cont KPad  GERD: Stable, states he had some heartburn overnight.  -Holding home famotidine 40 mg qod d/t; started on PPI on 8/19   Hyperlipidemia: Lipid panel in 10/2017 wnl. Takes Atorvastatin 40 mg daily at home.  -Cont to hold home atorvastatin   Alcohol usePXT:GGYIRSWrmer h/o drinking beer, never any hard liquor. Last reporteddrink was 2 months ago.  -monitor onCIWA; 0-1 overnight -Folate, thiamine, and multivitamin per protocol  Tobacco UseNIO:EVOJJK2 PPD, offered nicotine patch, pt declined at admission.  History of solitary pulmonary nodule: Small stable 6mm49mdule in the LL base last noted in 10/2017 CT abdomen, however not noted in8/18CT abdomen, possibly not high enough for visualization.  -Cont  to  monitor   FEN/GI:Liquid diet, advance as tolerated, IV in place, schedule the miralax BID for constipation  Prophylaxis:Heparin q8 today; SCDs and frequent ambulation   Disposition:Continued inpatient care; Evaluation with GI   Subjective:  No acute events overnight.  Patient is doing well this morning, walking up and down the halls.  States his abdomen is feeling better, wanting to have a full diet. No N/V. No bowel movement per patient since Friday.  Objective: Temp:  [97.7 F (36.5 C)-98.2 F (36.8 C)] 97.7 F (36.5 C) (08/21 0423) Pulse Rate:  [69-79] 69 (08/21 0423) Resp:  [16] 16 (08/21 0423) BP: (107-124)/(62-75) 124/75 (08/21 0423) SpO2:  [94 %-96 %] 96 % (08/21 0423) Physical Exam: General: Alert, NAD HEENT: NCAT, MMM, oropharynx nonerythematous  Cardiac: RRR no m/g/r Lungs: Clear bilaterally, no increased WOB  Abdomen: soft, non-tender, non-distended, normoactive BS Msk: Moves all extremities spontaneously, normal gait   Ext: Warm, dry, 2+ distal pulses, no edema   Laboratory: Recent Labs  Lab 03/12/18 0528 03/13/18 0642 03/14/18 0444  WBC 14.0* 14.2* 10.8*  HGB 14.5 15.3 14.4  HCT 45.1 48.0 44.8  PLT 281 335 294   Recent Labs  Lab 03/11/18 0950 03/12/18 0528 03/13/18 0642  NA 137 138 140  K 5.1 5.0 3.7  CL 104 106 98  CO2 27 24 35*  BUN _0 CREATININE 0.83 0.83 0.94  CALCIUM 9.5 9.3 9.6  PROT 7.9 7.4 7.7  BILITOT 0.9 1.3* 0.9  ALKPHOS 157* 143* 146*  ALT 121* 82* 66*  AST 111* 55* 40  GLUCOSE 125* 74 123*      [address] DO 03/14/2018, 5:44 AM PGY-1, [place] Intern pager: [phone], text pages welcome

## 2018-03-15 ENCOUNTER — Telehealth: Payer: Self-pay

## 2018-03-15 DIAGNOSIS — K5900 Constipation, unspecified: Secondary | ICD-10-CM

## 2018-03-15 DIAGNOSIS — K863 Pseudocyst of pancreas: Principal | ICD-10-CM

## 2018-03-15 LAB — CBC WITH DIFFERENTIAL/PLATELET
Abs Immature Granulocytes: 0 10*3/uL (ref 0.0–0.1)
Basophils Absolute: 0.1 10*3/uL (ref 0.0–0.1)
Basophils Relative: 1 %
Eosinophils Absolute: 0.5 10*3/uL (ref 0.0–0.7)
Eosinophils Relative: 6 %
HCT: 45.9 % (ref 39.0–52.0)
Hemoglobin: 14.5 g/dL (ref 13.0–17.0)
Immature Granulocytes: 0 %
Lymphocytes Relative: 38 %
Lymphs Abs: 3.5 10*3/uL (ref 0.7–4.0)
MCH: 30.2 pg (ref 26.0–34.0)
MCHC: 31.6 g/dL (ref 30.0–36.0)
MCV: 95.6 fL (ref 78.0–100.0)
Monocytes Absolute: 1.1 10*3/uL — ABNORMAL HIGH (ref 0.1–1.0)
Monocytes Relative: 12 %
Neutro Abs: 4 10*3/uL (ref 1.7–7.7)
Neutrophils Relative %: 43 %
Platelets: 322 10*3/uL (ref 150–400)
RBC: 4.8 MIL/uL (ref 4.22–5.81)
RDW: 14 % (ref 11.5–15.5)
WBC: 9.3 10*3/uL (ref 4.0–10.5)

## 2018-03-15 LAB — COMPREHENSIVE METABOLIC PANEL
ALT: 52 U/L — ABNORMAL HIGH (ref 0–44)
AST: 40 U/L (ref 15–41)
Albumin: 3.5 g/dL (ref 3.5–5.0)
Alkaline Phosphatase: 148 U/L — ABNORMAL HIGH (ref 38–126)
Anion gap: 7 (ref 5–15)
BUN: 12 mg/dL (ref 8–23)
CO2: 31 mmol/L (ref 22–32)
Calcium: 9.4 mg/dL (ref 8.9–10.3)
Chloride: 101 mmol/L (ref 98–111)
Creatinine, Ser: 0.81 mg/dL (ref 0.61–1.24)
GFR calc Af Amer: >60 mL/min (ref >60)
GFR calc non Af Amer: >60 mL/min (ref >60)
Glucose, Bld: 115 mg/dL — ABNORMAL HIGH (ref 70–99)
Potassium: 4.1 mmol/L (ref 3.5–5.1)
Sodium: 139 mmol/L (ref 135–145)
Total Bilirubin: 0.8 mg/dL (ref 0.3–1.2)
Total Protein: 7.5 g/dL (ref 6.5–8.1)

## 2018-03-15 LAB — GLUCOSE, CAPILLARY
Glucose-Capillary: 100 mg/dL — ABNORMAL HIGH (ref 70–99)
Glucose-Capillary: 136 mg/dL — ABNORMAL HIGH (ref 70–99)
Glucose-Capillary: 223 mg/dL — ABNORMAL HIGH (ref 70–99)
Glucose-Capillary: 113 mg/dL — ABNORMAL HIGH (ref 70–99)

## 2018-03-15 MED ORDER — HEPARIN SODIUM (PORCINE) 5000 UNIT/ML IJ SOLN
5000.0000 [IU] | Freq: Three times a day (TID) | INTRAMUSCULAR | Status: DC
  Administered 2018-03-15 – 2018-03-16 (×4): 5000 [IU] via SUBCUTANEOUS
  Filled 2018-03-15 (×4): qty 1

## 2018-03-15 MED ORDER — FLEET ENEMA 7-19 GM/118ML RE ENEM
1.0000 | ENEMA | Freq: Once | RECTAL | Status: AC
  Administered 2018-03-15: 1 via RECTAL
  Filled 2018-03-15: qty 1

## 2018-03-15 NOTE — Progress Notes (Signed)
Daily Rounding Note  03/15/2018, 10:08 AM  LOS: 4 days   SUBJECTIVE:   No abdominal pain, just TTP in epigastrium Despite suppository, laxatives last night, no BM yet.     Tolerating HH.CM diet.  Feels ready for discharge.    OBJECTIVE:         Vital signs in last 24 hours:    Temp:  [97.6 F (36.4 C)-98 F (36.7 C)] 97.6 F (36.4 C) (08/22 0435) Pulse Rate:  [63-76] 63 (08/22 0435) Resp:  [16-18] 16 (08/22 0435) BP: (117-121)/(67-72) 121/72 (08/22 0435) SpO2:  [95 %-99 %] 99 % (08/22 0801) Last BM Date: 03/09/18 Filed Weights   03/11/18 0947 03/11/18 1636  Weight: 71.7 kg 72 kg   General: pleasant, comfortable.  Looks well   Heart: RRR Chest: clear bil Abdomen: soft, slight epigastric tenderness.  Active BS  Extremities: no CCE Neuro/Psych:  Oriented x 3.  Moves all 4s.  Fully alert.    Intake/Output from previous day: 08/21 0701 - 08/22 0700 In: 760 [P.O.:760] Out: -   Intake/Output this shift: No intake/output data recorded.  Lab Results: Recent Labs    03/13/18 0642 03/14/18 0444 03/15/18 0524  WBC 14.2* 10.8* 9.3  HGB 15.3 14.4 14.5  HCT 48.0 44.8 45.9  PLT 335 294 322   BMET Recent Labs    03/13/18 0642 03/14/18 0444 03/15/18 0524  NA 140 138 139  K 3.7 3.8 4.1  CL 98 103 101  CO2 35* 29 31  GLUCOSE 123* 120* 115*  BUN '8 12 12  ' CREATININE 0.94 0.81 0.81  CALCIUM 9.6 9.2 9.4   LFT Recent Labs    03/13/18 0642 03/14/18 0444 03/15/18 0524  PROT 7.7 7.1 7.5  ALBUMIN 3.6 3.3* 3.5  AST 40 30 40  ALT 66* 51* 52*  ALKPHOS 146* 133* 148*  BILITOT 0.9 0.8 0.8    Studies/Results: Dg Abd 2 Views  Result Date: 03/14/2018 CLINICAL DATA:  Acute generalized abdominal pain. EXAM: ABDOMEN - 2 VIEW COMPARISON:  Radiographs of January 14, 2015. FINDINGS: No abnormal bowel dilatation is noted. Large amount of stool seen throughout the colon. There is no evidence of free air. No radio-opaque  calculi or other significant radiographic abnormality is seen. IMPRESSION: No evidence of bowel obstruction or ileus.  Large stool burden. Electronically Signed   By: Marijo Conception, M.D.   On: 03/14/2018 15:09   Scheduled Meds: . bisacodyl  5 mg Oral Daily  . feeding supplement (ENSURE ENLIVE)  237 mL Oral BID BM  . folic acid  1 mg Oral Daily  . heparin injection (subcutaneous)  5,000 Units Subcutaneous Q8H  . insulin aspart  0-9 Units Subcutaneous TID WC  . metoprolol tartrate  75 mg Oral BID  . mometasone-formoterol  2 puff Inhalation BID  . multivitamin with minerals  1 tablet Oral Daily  . pantoprazole  40 mg Oral Q0600  . polyethylene glycol  17 g Oral BID  . thiamine  100 mg Oral Daily   Or  . thiamine  100 mg Intravenous Daily   Continuous Infusions: PRN Meds:.acetaminophen **OR** acetaminophen, albuterol, ondansetron **OR** ondansetron (ZOFRAN) IV, oxyCODONE  ASSESMENT:   *  Pancreatic pseudocyst, progressive c/w 4 months ago.  Caused duodenal obstruction, now resolved.  Clinically doing well.   Chronic pancreatitis due to ETOH.   LFTs overall improved but Alk phos up a bit in last 24 hours.  No fevers.   *  Chronic xarelto, on hold.  Currently on SQ Heparin.    *  Constipation per xray 8/21.    *    Hyperglycemia.  SSI in place.    *   FOBT + pm home based FOBT test, no prior colonoscopy.  He is not anemic   PLAN   *  From GI standpoint OK to discharge home today.  His ride home does not drive after dark.    *  fup in GI office with Dr Jerilynn Mages 9/20 at [facility].  Cancelled new pt visit with [facility] for 10/1.   If has trouble prior to this can call and arrange sooner visit with APP  *  Resume Xarelto.  Resume Pepcid at home, no need for PPI used as inpt.       Azucena Freed  03/15/2018, 10:08 AM Phone [phone]

## 2018-03-15 NOTE — Telephone Encounter (Signed)
-----   Message from Vena Rua, PA-C sent at 03/15/2018 10:48 AM EDT ----- Regarding: arrange CT Hi Jaiyana Canale.  Pt has ROV with Dr Jerilynn Mages for pancreatic pseudocyst set for 9/20. Dr Jerilynn Mages want pancreatic protocol CT abdomen (pelvis not needed) a day or 2 before that visit.  Can u arrange this?  Reason is fup pancreatic pseudocyst.   Call him at [phone] with time/date.    Thanks and have a great day.   S

## 2018-03-15 NOTE — Plan of Care (Signed)

## 2018-03-15 NOTE — Care Management Important Message (Signed)
Important Message  Patient Details  Name: Timothy Martinez MRN: [id number] Date of Birth: Jan 03, 1951   Medicare Important Message Given:  Yes    Orbie Pyo 03/15/2018, 10:04 AM

## 2018-03-15 NOTE — Plan of Care (Signed)
  Problem: Clinical Measurements: Goal: Diagnostic test results will improve Outcome: Progressing   Problem: Nutrition: Goal: Adequate nutrition will be maintained Outcome: Progressing   Problem: Pain Managment: Goal: General experience of comfort will improve Outcome: Progressing   

## 2018-03-15 NOTE — Telephone Encounter (Signed)
-----   Message from Irving Copas., MD sent at 03/15/2018  2:58 PM EDT ----- Regarding: RE: arrange CT Montrel Donahoe, I thought a bit more, and as we are likely going to plan an ERCP on him at some point in the future, would be reasonable to get a MRI/MRCP. Let's plan for that before clinic. Dx - Pancreatic duct stone, Pancreatic duct dilation, Acute on Chronic pancreatitis, pancreatic cyst  Gabe ----- Message ----- From: Vena Rua, PA-C Sent: 03/15/2018  10:48 AM EDT To: Timothy Lasso, RN, Irving Copas., MD Subject: arrange CT                                     Hi Keali Mccraw.  Pt has ROV with Dr Jerilynn Mages for pancreatic pseudocyst set for 9/20. Dr Jerilynn Mages want pancreatic protocol CT abdomen (pelvis not needed) a day or 2 before that visit.  Can u arrange this?  Reason is fup pancreatic pseudocyst.   Call him at [phone] with time/date.    Thanks and have a great day.   S

## 2018-03-15 NOTE — Progress Notes (Signed)
Family Medicine Teaching Service Daily Progress Note Intern Pager: [phone]  Patient name: Timothy Martinez Medical record number: [id number] Date of birth: 01-29-1951 Age: 67 y.o. Gender: male  Primary Care Provider: [name], DO Consultants: GI Code Status: Full    Assessment and Plan: Rashawd Laskaris Sluderis a 67 y.o.malepresenting with N/V x3 days, found to have acute on chronic pancreatitis with obstructing pancreatic pseudocyst. PMH is significant forchronic pancreatitis, type 2 diabetes, COPD, chronic atrial fibrillation, GERD, hyperlipidemia, chronic lower back pain with sciatica, solitary pulmonary nodule, and tobacco use.   Acute onchronicpancreatitiswithduodenumobstructing pseudocyst:Had increased abdominal pain yesterday, but no BM since Friday. Abdominal pain improved this am, no N/V or bowel movement yet. Tolerating solid food without concern. WBC wnl at 9.3. AST/ALT trending down to 40 and 52, Alk Phos stable but slightly elevated from yest at 148.Bilirubin wnl at 0.8 today. Lipase 169 from 1,526 on admission. GI not recommending any further interventions today, however will reassess if patient is not tolerating diet, with consideration for core track/NJ/NG. He will require an ERCP at some point, however this may be done outpatient.  -GI following; appreciate recs -Continue solid diet as tolerated -Continue Protonix 40 mg daily -KVO fluids -Zofran 4 mg as needed -Pain control: Tylenol for mild and oxycodone 2.5 mg every 4 hours as needed moderate-severe (dec from 5 overnight) -CBC and CMP  Constipation: Pt endorses no bowel movement since Friday, with increased abdominal pain yesterday. Abdomen XR 8/21 showing large stool burden without signs of ileus or SBO. Has miralax and colace scheduled currently. Will discuss with patient on suppository vs manual disimpaction.  -Cont miralax and colace BID   Type 2 Diabetes: A1c 6.1 in 01/2018.Glucose 150 on average.Takes  metformin 1026m BID at home.  -sSSI -Monitor glucose q4 -Hold homemetformin  ParoxysmalAtrial Fibrillations/p Cardioversion:Stable, follows with Dr. [name].Appears to be in sinus rhythm on examand israte controlled. S/p Cardioversion in 08/2014.Takes metoprolol 50 mg BID and Xarelto 20 mg at home.  -EKG8/19 NSR -Cont home metoprolol 568mBID -home Xarelto 20109mld after 8/18 dose  Recent Weight loss: Pt endorses a 20 lb weight loss with night sweats in the past approximately 6 months, despite his normal diet and activity. His PCP has recently recommended adding ensure BID with meals for weight gain. Concern for possible underlying lung vs GI malignancy, however likely chronic pancreatitis is contributing. He is already scheduledto see GIin 04/2018. Has not had a screening low dose CT chest scan in the past.  -Likely start pancreatitic enzymes once tolerating full diet(creon)  -Set upa screening low dose CT outpatient -Monitor weight  COPD:Stableon RA, uses Dulera BID and albuterol as needed at home. However, has been using his albuterol daily for coughing and perceived SOB.He currently denies any SOB. Recommend initiating further controller medications, such as LAMA, outpatient.  -Cont home dulera BID  -Albuterol as needed for SOB   Chronic lower back pain with sciatica:Stable. Does not use any chronic pain medications,and stopped taking his home gabapentin. -Hold home gabapentin -hasoxycodonePRN for pancreatitis pain, as above -ContKPad  GERD: Stable. -Holding home famotidine 40 mg qod d/t; started on PPI on 8/19  Hyperlipidemia: Lipid panel in 10/2017 wnl. Takes Atorvastatin 40 mg daily at home.  -Cont to hold home atorvastatin   Alcohol useEHM:CNOBSJGrmer h/o drinking beer, never any hard liquor.  -monitor onCIWA; 0-1 overnight  Tobacco UseGEZ:MOQHUT2 PPD, offered nicotine patch, pt declined at admission.  History of solitary  pulmonary nodule: Small stable 6mm47mdule in the  LL base last noted in 10/2017 CT abdomen, however not noted in8/18CT abdomen, possibly not high enough for visualization.  -Cont to monitor   FEN/GI:Solid diet as tolerated, IV in place, scheduled the miralax BID/colace for constipation  Prophylaxis:Heparin q8 today; SCDsand frequent ambulation  Disposition: evaluation by GI today  Subjective:  Had increasing abdominal pain yesterday and has not had a bowel movement since Friday.  This morning, he states that his abdominal pain has improved however no bowel movement yet.  Denies nausea and vomiting.  Tolerating solid foods for lunch and dinner yesterday without concern.  Objective: Temp:  [97.6 F (36.4 C)-98 F (36.7 C)] 97.6 F (36.4 C) (08/22 0435) Pulse Rate:  [63-76] 63 (08/22 0435) Resp:  [16-18] 16 (08/22 0435) BP: (117-121)/(67-72) 121/72 (08/22 0435) SpO2:  [95 %-97 %] 97 % (08/22 0435) Physical Exam: General: Alert, NAD HEENT: NCAT, MMM, oropharynx nonerythematous  Cardiac: RRR no m/g/r Lungs: Clear bilaterally, no increased WOB  Abdomen: soft, non-distended, normoactive BS, tender to epigastrium and RUQ.  Msk: Moves all extremities spontaneously  Ext: Warm, dry, 2+ distal pulses, no edema   Laboratory: Recent Labs  Lab 03/12/18 0528 03/13/18 0642 03/14/18 0444  WBC 14.0* 14.2* 10.8*  HGB 14.5 15.3 14.4  HCT 45.1 48.0 44.8  PLT 281 335 294   Recent Labs  Lab 03/12/18 0528 03/13/18 0642 03/14/18 0444  NA 138 140 138  K 5.0 3.7 3.8  CL 106 98 103  CO2 24 35* 29  BUN _0 CREATININE 0.83 0.94 0.81  CALCIUM 9.3 9.6 9.2  PROT 7.4 7.7 7.1  BILITOT 1.3* 0.9 0.8  ALKPHOS 143* 146* 133*  ALT 82* 66* 51*  AST 55* 40 30  GLUCOSE 74 123* 120*     Imaging/Diagnostic Tests: Dg Abd 2 Views  Result Date: 03/14/2018 CLINICAL DATA:  Acute generalized abdominal pain. EXAM: ABDOMEN - 2 VIEW COMPARISON:  Radiographs of January 14, 2015. FINDINGS: No  abnormal bowel dilatation is noted. Large amount of stool seen throughout the colon. There is no evidence of free air. No radio-opaque calculi or other significant radiographic abnormality is seen. IMPRESSION: No evidence of bowel obstruction or ileus.  Large stool burden. Electronically Signed   By: Marijo Conception, M.D.   On: 03/14/2018 15:09    Patriciaann Clan, DO 03/15/2018, 5:53 AM PGY-1, [place] Intern pager: [phone], text pages welcome

## 2018-03-16 LAB — COMPREHENSIVE METABOLIC PANEL
ALT: 247 U/L — ABNORMAL HIGH (ref 0–44)
ALT: 225 U/L — ABNORMAL HIGH (ref 0–44)
AST: 274 U/L — ABNORMAL HIGH (ref 15–41)
AST: 199 U/L — ABNORMAL HIGH (ref 15–41)
Albumin: 3.6 g/dL (ref 3.5–5.0)
Albumin: 3.6 g/dL (ref 3.5–5.0)
Alkaline Phosphatase: 240 U/L — ABNORMAL HIGH (ref 38–126)
Alkaline Phosphatase: 229 U/L — ABNORMAL HIGH (ref 38–126)
Anion gap: 8 (ref 5–15)
Anion gap: 7 (ref 5–15)
BUN: 11 mg/dL (ref 8–23)
BUN: 11 mg/dL (ref 8–23)
CO2: 31 mmol/L (ref 22–32)
CO2: 30 mmol/L (ref 22–32)
Calcium: 9.8 mg/dL (ref 8.9–10.3)
Calcium: 9.6 mg/dL (ref 8.9–10.3)
Chloride: 100 mmol/L (ref 98–111)
Chloride: 101 mmol/L (ref 98–111)
Creatinine, Ser: 0.8 mg/dL (ref 0.61–1.24)
Creatinine, Ser: 0.77 mg/dL (ref 0.61–1.24)
GFR calc Af Amer: >60 mL/min (ref >60)
GFR calc Af Amer: >60 mL/min (ref >60)
GFR calc non Af Amer: >60 mL/min (ref >60)
GFR calc non Af Amer: >60 mL/min (ref >60)
Glucose, Bld: 111 mg/dL — ABNORMAL HIGH (ref 70–99)
Glucose, Bld: 130 mg/dL — ABNORMAL HIGH (ref 70–99)
Potassium: 4.5 mmol/L (ref 3.5–5.1)
Potassium: 4 mmol/L (ref 3.5–5.1)
Sodium: 139 mmol/L (ref 135–145)
Sodium: 138 mmol/L (ref 135–145)
Total Bilirubin: 0.7 mg/dL (ref 0.3–1.2)
Total Bilirubin: 1.2 mg/dL (ref 0.3–1.2)
Total Protein: 7.4 g/dL (ref 6.5–8.1)
Total Protein: 7.6 g/dL (ref 6.5–8.1)

## 2018-03-16 LAB — GLUCOSE, CAPILLARY
Glucose-Capillary: 94 mg/dL (ref 70–99)
Glucose-Capillary: 106 mg/dL — ABNORMAL HIGH (ref 70–99)
Glucose-Capillary: 89 mg/dL (ref 70–99)
Glucose-Capillary: 105 mg/dL — ABNORMAL HIGH (ref 70–99)

## 2018-03-16 MED ORDER — RIVAROXABAN 20 MG PO TABS
20.0000 mg | ORAL_TABLET | Freq: Every day | ORAL | Status: DC
  Administered 2018-03-16 – 2018-03-17 (×2): 20 mg via ORAL
  Filled 2018-03-16 (×2): qty 1

## 2018-03-16 MED ORDER — FLEET ENEMA 7-19 GM/118ML RE ENEM: 1.0000 | ENEMA | Freq: Once | RECTAL | Status: DC

## 2018-03-16 MED ORDER — FLEET ENEMA 7-19 GM/118ML RE ENEM
1.0000 | ENEMA | Freq: Once | RECTAL | Status: AC
  Administered 2018-03-16: 1 via RECTAL
  Filled 2018-03-16: qty 1

## 2018-03-16 NOTE — Telephone Encounter (Signed)
Pt still admitted will schedule on Monday

## 2018-03-16 NOTE — Plan of Care (Signed)

## 2018-03-16 NOTE — Progress Notes (Addendum)
Family Medicine Teaching Service Daily Progress Note Intern Pager: [phone]  Patient name: Timothy Martinez Medical record number: [id number] Date of birth: 05/07/1951 Age: 67 y.o. Gender: male  Primary Care Provider: [name], DO Consultants: GI Code Status: Full    Assessment and Plan: Hawley Michel Sluderis a 67 y.o.malepresenting with N/V x3 days, found to have acute on chronic pancreatitis with obstructing pancreatic pseudocyst. PMH is significant forchronic pancreatitis, type 2 diabetes, COPD, chronic atrial fibrillation, GERD, hyperlipidemia, chronic lower back pain with sciatica, solitary pulmonary nodule, and tobacco use.   Acute onchronicpancreatitiswithduodenumobstructing pseudocyst:Tolerating full diet without concern. Has some epigastric and RUQ abdominal pain this am, but improved from yesterday. However, AST and ALT quite elevated this am, 274/247 from 40/52 yesterday. Alk phos 240, from 148. Will repeat CMP and discuss with GI pending results. As of yesterday, GI has set up a follow-up outpatient appointment for further evaluation, will not be performing any intervention inpatient. - GI following appreciated input --Repeat CMP; discuss with GI pending results  - Continue solid diet as tolerated - DC Protonix 40; back to home famotidine  -KVO fluids -Zofran 4 mg as needed -Pain control Tylenol and oxycodone as needed -Follow-up with CMP this morning  Constipation:  Received Fleet enema last night. Had BM last night afterwards, large diameter. Abdomen feeling better.   -Cont miralax and colace BID   Type 2 Diabetes: A1c 6.1 in 01/2018.Glucoseranging 90-200.Takes metformin 1060m BID at home.  -sSSI -Monitor glucose q4 -Restart home metformin  ParoxysmalAtrial Fibrillations/p Cardioversion:Stable, follows with Dr[name].Appears to be in sinus rhythm on examand israte controlled. S/p Cardioversion in 08/2014.Takes metoprolol 50 mg BID  and Xarelto 20 mg at home.  -Cont home metoprolol 571mBID -home Xarelto 2050mld after 8/18 dose, can restart on discharge  Recent Weight loss: Pt endorses a 20 lb weight loss with night sweats in the past approximately 6 months, despite his normal diet and activity. His PCP has recently recommended adding ensure BID with meals for weight gain. Concern for possible underlying lung vs GI malignancy, however likely chronic pancreatitis is contributing.  -Likely start pancreatitic enzymes once tolerating full diet(creon) -Set upa screening low dose CT outpatient -Monitor weight  COPD:Stableon RA, uses Dulera BID and albuterol as needed at home. However, has been using his albuterol daily for coughing and perceived SOB.He currently denies any SOB. Recommend initiating further controller medications, such as LAMA, outpatient.  -Cont home dulera BID  -Albuterol as needed for SOB   Chronic lower back pain with sciatica:Stable. Does not use any chronic pain medications,and stopped taking his home gabapentin. -Hold home gabapentin -hasoxycodonePRN for pancreatitis pain, as above -ContKPad  GERD: Stable. -Restart home famotidine   Hyperlipidemia: Lipid panel in 10/2017 wnl. Takes Atorvastatin 40 mg daily at home.  -Restart home atorvastatin   Alcohol useWIO:MBTDHRCrmer h/o drinking beer, never any hard liquor.  -monitor onCIWA; 0-1overnight  Tobacco UseBUL:AGTXMI2 PPD, offered nicotine patch, pt declined at admission.  History of solitary pulmonary nodule: Small stable 6mm32mdule in the LL base last noted in 10/2017 CT abdomen, however not noted in8/18CT abdomen, possibly not high enough for visualization.  -Cont to monitor   FEN/GI:Solid diet as tolerated, IV in place, scheduled the miralax BID/colacefor constipation Prophylaxis:Heparin q8 today; SCDsand frequent ambulation  Disposition: D/c pending CMP results   Subjective:  No acute events  overnight.  Patient doing well this morning, ready to go home.  States abdominal pain is better, but still has some right  upper quadrant and epigastric abdominal pain.  Denies nausea, vomiting, diarrhea.  Had BM yesterday.  Objective: Temp:  [97.8 F (36.6 C)-98.2 F (36.8 C)] 98.2 F (36.8 C) (08/23 0451) Pulse Rate:  [64-67] 67 (08/23 0451) Resp:  [16-17] 17 (08/23 0451) BP: (104-134)/(74-85) 104/85 (08/23 0451) SpO2:  [98 %-100 %] 100 % (08/23 0451) Physical Exam: General: Alert, NAD HEENT: NCAT, MMM, oropharynx nonerythematous  Cardiac: RRR no m/g/r Lungs: Clear bilaterally, no increased WOB  Abdomen: soft, tender in right upper quadrant and epigastrium, non-distended, normoactive BS Msk: Moves all extremities spontaneously  Ext: Warm, dry, 2+ distal pulses, no edema   Laboratory: Recent Labs  Lab 03/13/18 0642 03/14/18 0444 03/15/18 0524  WBC 14.2* 10.8* 9.3  HGB 15.3 14.4 14.5  HCT 48.0 44.8 45.9  PLT 335 294 322   Recent Labs  Lab 03/13/18 0642 03/14/18 0444 03/15/18 0524  NA 140 138 139  K 3.7 3.8 4.1  CL 98 103 101  CO2 35* 29 31  BUN '8 12 12  ' CREATININE 0.94 0.81 0.81  CALCIUM 9.6 9.2 9.4  PROT 7.7 7.1 7.5  BILITOT 0.9 0.8 0.8  ALKPHOS 146* 133* 148*  ALT 66* 51* 52*  AST 40 30 40  GLUCOSE 123* 120* 115*     Imaging/Diagnostic Tests: No results found.  Patriciaann Clan, DO 03/16/2018, 5:54 AM PGY-1, [place] Intern pager: [phone], text pages welcome

## 2018-03-17 ENCOUNTER — Other Ambulatory Visit: Payer: Self-pay

## 2018-03-17 LAB — GLUCOSE, CAPILLARY
Glucose-Capillary: 109 mg/dL — ABNORMAL HIGH (ref 70–99)
Glucose-Capillary: 102 mg/dL — ABNORMAL HIGH (ref 70–99)
Glucose-Capillary: 109 mg/dL — ABNORMAL HIGH (ref 70–99)
Glucose-Capillary: 81 mg/dL (ref 70–99)

## 2018-03-17 LAB — COMPREHENSIVE METABOLIC PANEL
ALT: 195 U/L — ABNORMAL HIGH (ref 0–44)
AST: 132 U/L — ABNORMAL HIGH (ref 15–41)
Albumin: 4.2 g/dL (ref 3.5–5.0)
Alkaline Phosphatase: 238 U/L — ABNORMAL HIGH (ref 38–126)
Anion gap: 18 — ABNORMAL HIGH (ref 5–15)
BUN: 20 mg/dL (ref 8–23)
CO2: 26 mmol/L (ref 22–32)
Calcium: 10 mg/dL (ref 8.9–10.3)
Chloride: 96 mmol/L — ABNORMAL LOW (ref 98–111)
Creatinine, Ser: 1.21 mg/dL (ref 0.61–1.24)
GFR calc Af Amer: >60 mL/min (ref >60)
GFR calc non Af Amer: >60 mL/min (ref >60)
Glucose, Bld: 147 mg/dL — ABNORMAL HIGH (ref 70–99)
Potassium: 3.9 mmol/L (ref 3.5–5.1)
Sodium: 140 mmol/L (ref 135–145)
Total Bilirubin: 1.8 mg/dL — ABNORMAL HIGH (ref 0.3–1.2)
Total Protein: 8.7 g/dL — ABNORMAL HIGH (ref 6.5–8.1)

## 2018-03-17 LAB — CBC WITH DIFFERENTIAL/PLATELET
Abs Immature Granulocytes: 0 10*3/uL (ref 0.0–0.1)
Basophils Absolute: 0.1 10*3/uL (ref 0.0–0.1)
Basophils Relative: 1 %
Eosinophils Absolute: 0.1 10*3/uL (ref 0.0–0.7)
Eosinophils Relative: 1 %
HCT: 52.8 % — ABNORMAL HIGH (ref 39.0–52.0)
Hemoglobin: 17 g/dL (ref 13.0–17.0)
Immature Granulocytes: 0 %
Lymphocytes Relative: 26 %
Lymphs Abs: 2.3 10*3/uL (ref 0.7–4.0)
MCH: 30.4 pg (ref 26.0–34.0)
MCHC: 32.2 g/dL (ref 30.0–36.0)
MCV: 94.3 fL (ref 78.0–100.0)
Monocytes Absolute: 1 10*3/uL (ref 0.1–1.0)
Monocytes Relative: 10 %
Neutro Abs: 5.6 10*3/uL (ref 1.7–7.7)
Neutrophils Relative %: 62 %
Platelets: 359 10*3/uL (ref 150–400)
RBC: 5.6 MIL/uL (ref 4.22–5.81)
RDW: 13.9 % (ref 11.5–15.5)
WBC: 9.1 10*3/uL (ref 4.0–10.5)

## 2018-03-17 LAB — LIPASE, BLOOD: Lipase: 623 U/L — ABNORMAL HIGH (ref 11–51)

## 2018-03-17 MED ORDER — HYDROMORPHONE HCL 1 MG/ML IJ SOLN
0.5000 mg | Freq: Once | INTRAMUSCULAR | Status: AC
  Administered 2018-03-17: 0.5 mg via INTRAVENOUS
  Filled 2018-03-17: qty 1

## 2018-03-17 MED ORDER — SODIUM CHLORIDE 0.9 % IV SOLN
INTRAVENOUS | Status: DC
  Administered 2018-03-17 – 2018-03-18 (×3): via INTRAVENOUS

## 2018-03-17 MED ORDER — FAMOTIDINE 20 MG PO TABS
40.0000 mg | ORAL_TABLET | ORAL | Status: DC
  Administered 2018-03-17: 40 mg via ORAL
  Filled 2018-03-17: qty 2

## 2018-03-17 NOTE — Plan of Care (Signed)
  Problem: Clinical Measurements: Goal: Will remain free from infection Outcome: Not Progressing   Problem: Nutrition: Goal: Adequate nutrition will be maintained Outcome: Not Progressing   Problem: Elimination: Goal: Will not experience complications related to bowel motility Outcome: Not Progressing   Problem: Pain Managment: Goal: General experience of comfort will improve Outcome: Not Progressing

## 2018-03-17 NOTE — Progress Notes (Signed)
Initial Nutrition Assessment  DOCUMENTATION CODES:   Not applicable  INTERVENTION:    Advance diet as medically appropriate; add supplements when able  NUTRITION DIAGNOSIS:   Increased nutrient needs related to acute illness(pancreatitis) as evidenced by estimated needs  GOAL:   Patient will meet greater than or equal to 90% of their needs  MONITOR:   Diet advancement, PO intake, Labs, Weight trends, Skin, I & O's  REASON FOR ASSESSMENT:   Malnutrition Screening Tool  ASSESSMENT:   67 y.o. Male who presented with N/V x3 days, found to have acute on chronic pancreatitis with obstructing pancreatic pseudocyst. PMH is significant for chronic pancreatitis, type 2 diabetes, COPD, chronic lower back pain with sciatica, solitary pulmonary nodule, and tobacco use.    RD spoke with patient at bedside.  Reports he's not feeling hungry right now. Ate all of his breakfast yesterday morning however.  Reveals the pain medication he received last night caused him vomit. He states he has lost weight, however, unable to identify amount or time frame. Shares his MD told him to start drinking two Ensure supplements per day PTA.  CIWA Protocol; receiving thiamine, MVI and folvite. Labs reviewed. Lipase 623 (H). CBG's 109-102-109.  NUTRITION - FOCUSED PHYSICAL EXAM:  Deferred at this time.  Diet Order:   Diet Order            Diet NPO time specified  Diet effective now             EDUCATION NEEDS:   No education needs have been identified at this time  Skin:  Skin Assessment: Reviewed RN Assessment  Last BM:  8/23  Height:   Ht Readings from Last 1 Encounters:  03/11/18 6\' 3"  (1.905 m)   Weight:   Wt Readings from Last 1 Encounters:  03/11/18 72 kg   BMI:  Body mass index is 19.84 kg/m.  Estimated Nutritional Needs:   Kcal:  1800-2000  Protein:  90-105 gm   Fluid:  1.8-2.0 L  Arthur Holms, RD, LDN Pager #: [phone] After-Hours Pager #: [phone]

## 2018-03-17 NOTE — Progress Notes (Signed)
Family Medicine Teaching Service Daily Progress Note Intern Pager: [phone]  Patient name: Timothy Martinez Medical record number: [id number] Date of birth: 11-Apr-1951 Age: 67 y.o. Gender: male  Primary Care Provider: [name], DO Consultants: GI  Code Status: Full  Assessment and Plan: Timothy Melody Sluderis a 67 y.o.malepresenting with N/V x3 days, found to have acute on chronic pancreatitis with obstructing pancreatic pseudocyst. PMH is significant forchronic pancreatitis, type 2 diabetes, COPD, chronic atrial fibrillation, GERD, hyperlipidemia, chronic lower back pain with sciatica, solitary pulmonary nodule, and tobacco use.   Acute onchronicpancreatitiswithduodenumobstructing pseudocyst:Pt reports "he feels great", after bout of emesis earlier today and last night. However, has not eaten anything since breakfast yesterday due to lack of appetite and continues to have TTP of RUQ and epigastrium. AST and ALT trended down this am to 132 and 195, however bilirubin up to 1.8 from 1.2. Alk phos 238 from 229. Will obtain repeat lipase, with potential discussion with GI this afternoon. GI has set up a follow-up outpatient appointment for further evaluation, will not currently be performing any intervention inpatient. - Continue solid diet as tolerated - Cont home famotidine  -start gentle hydration with 75 ml/hr NS  -Zofran 4 mg as needed -Pain control Tylenol and oxycodone as needed -F/u lipase  AKI: Cr 1.21 from 0.77. In the setting of recent bouts of emesis and large bowel movements with poor PO intake yesterday.  -gentle hydration -monitor CMP   Constipation:Resolved. Received a 2nd enema yesterday afternoon with large bowel movement following.  -Cont miralax and colace BID  Type 2 Diabetes: A1c 6.1 in 01/2018.Glucoseranging 90-100.Takes metformin 1074m BID at home.  -sSSI -Monitor glucose q4  ParoxysmalAtrial Fibrillations/p Cardioversion:Stable, follows with  [facility].Appears to be in sinus rhythm on examand israte controlled. S/p Cardioversion in 08/2014.Takes metoprolol 50 mg BID and Xarelto 20 mg at home.  -Cont home metoprolol 565mBID -Cont home Xarelto   Recent Weight loss: Pt endorses a 20 lb weight loss with night sweats in the past approximately 6 months, despite his normal diet and activity. His PCP has recently recommended adding ensure BID with meals for weight gain. Concern for possible underlying lung vs GI malignancy, however likely chronic pancreatitis is contributing.  -Set upa screening low dose CT outpatient -Monitor weight  COPD:Stableon RA, uses Dulera BID and albuterol as needed at home. However, has been using his albuterol daily for coughing and perceived SOB.He currently denies any SOB. Recommend initiating further controller medications, such as LAMA, outpatient.  -Cont home dulera BID  -Albuterol as needed for SOB   Chronic lower back pain with sciatica:Stable.Does not use any chronic pain medications,and stopped taking his home gabapentin. -Hold home gabapentin -hasoxycodonePRN for pancreatitis pain, as above -ContKPad  GERD: Stable. -Restart home famotidine   Hyperlipidemia: Lipid panel in 10/2017 wnl. Takes Atorvastatin 40 mg daily at home.  -Restart home atorvastatin   Alcohol usJOA:CZYSAYTormer h/o drinking beer, never any hard liquor.  -monitor onCIWA; 0-1overnight  Tobacco UsKZS:[id number] PPD, offered nicotine patch, pt declined at admission.  History of solitary pulmonary nodule: Small stable 75m29module in the LL base last noted in 10/2017 CT abdomen, however not noted in8/18CT abdomen, possibly not high enough for visualization.  -Cont to monitor  FEN/GI: Regular diet as tolerated  PPx: Xarelto    Disposition: pending labs and improvement this am, potential d/c this afternoon  Subjective:  Had one bout of emesis last night and another this morning. States  it was because he  had oxycodone and Tylenol together.  Has not had any food since breakfast yesterday morning, due to lack of appetite. Continues to have right upper quadrant abdominal pain that he states oxycodone "isn't even touching", but then endorses that he "feels great" and is wanting to go home.   Objective: Temp:  [97.5 F (36.4 C)-98 F (36.7 C)] 97.5 F (36.4 C) (08/24 0444) Pulse Rate:  [62-77] 77 (08/24 0444) Resp:  [16-18] 18 (08/24 0444) BP: (125-139)/(64-79) 128/79 (08/24 0444) SpO2:  [98 %-99 %] 98 % (08/24 0444) Physical Exam: General: Alert, NAD HEENT: NCAT, MMM, oropharynx nonerythematous  Cardiac: RRR no m/g/r Lungs: Clear bilaterally, no increased WOB  Abdomen: soft, TTP of RUQ mostly and RLQ/epigastrium, non-distended, normoactive BS Msk: Moves all extremities spontaneously, normal gait  Ext: Warm, dry, 2+ distal pulses, no edema   Laboratory: Recent Labs  Lab 03/13/18 0642 03/14/18 0444 03/15/18 0524  WBC 14.2* 10.8* 9.3  HGB 15.3 14.4 14.5  HCT 48.0 44.8 45.9  PLT 335 294 322   Recent Labs  Lab 03/15/18 0524 03/16/18 0746 03/16/18 1319  NA 139 139 138  K 4.1 4.5 4.0  CL 101 100 101  CO2 _0 BUN _1 CREATININE 0.81 0.80 0.77  CALCIUM 9.4 9.8 9.6  PROT 7.5 7.4 7.6  BILITOT 0.8 0.7 1.2  ALKPHOS 148* 240* 229*  ALT 52* 247* 225*  AST 40 274* 199*  GLUCOSE 115* 111* 130*     Imaging/Diagnostic Tests: No results found.    Patriciaann Clan, DO 03/17/2018, 6:49 AM PGY-1, [place] Intern pager: [phone], text pages welcome

## 2018-03-18 LAB — CBC WITH DIFFERENTIAL/PLATELET
Abs Immature Granulocytes: 0 10*3/uL (ref 0.0–0.1)
Basophils Absolute: 0.2 10*3/uL — ABNORMAL HIGH (ref 0.0–0.1)
Basophils Relative: 2 %
Eosinophils Absolute: 0.4 10*3/uL (ref 0.0–0.7)
Eosinophils Relative: 4 %
HCT: 43.5 % (ref 39.0–52.0)
Hemoglobin: 14 g/dL (ref 13.0–17.0)
Immature Granulocytes: 0 %
Lymphocytes Relative: 23 %
Lymphs Abs: 2.2 10*3/uL (ref 0.7–4.0)
MCH: 30.1 pg (ref 26.0–34.0)
MCHC: 32.2 g/dL (ref 30.0–36.0)
MCV: 93.5 fL (ref 78.0–100.0)
Monocytes Absolute: 1.2 10*3/uL — ABNORMAL HIGH (ref 0.1–1.0)
Monocytes Relative: 13 %
Neutro Abs: 5.6 10*3/uL (ref 1.7–7.7)
Neutrophils Relative %: 58 %
Platelets: 305 10*3/uL (ref 150–400)
RBC: 4.65 MIL/uL (ref 4.22–5.81)
RDW: 13.8 % (ref 11.5–15.5)
WBC: 9.7 10*3/uL (ref 4.0–10.5)

## 2018-03-18 LAB — COMPREHENSIVE METABOLIC PANEL
ALT: 106 U/L — ABNORMAL HIGH (ref 0–44)
AST: 55 U/L — ABNORMAL HIGH (ref 15–41)
Albumin: 3.2 g/dL — ABNORMAL LOW (ref 3.5–5.0)
Alkaline Phosphatase: 151 U/L — ABNORMAL HIGH (ref 38–126)
Anion gap: 12 (ref 5–15)
BUN: 27 mg/dL — ABNORMAL HIGH (ref 8–23)
CO2: 25 mmol/L (ref 22–32)
Calcium: 9.1 mg/dL (ref 8.9–10.3)
Chloride: 103 mmol/L (ref 98–111)
Creatinine, Ser: 1 mg/dL (ref 0.61–1.24)
GFR calc Af Amer: >60 mL/min (ref >60)
GFR calc non Af Amer: >60 mL/min (ref >60)
Glucose, Bld: 100 mg/dL — ABNORMAL HIGH (ref 70–99)
Potassium: 4 mmol/L (ref 3.5–5.1)
Sodium: 140 mmol/L (ref 135–145)
Total Bilirubin: 1.4 mg/dL — ABNORMAL HIGH (ref 0.3–1.2)
Total Protein: 6.8 g/dL (ref 6.5–8.1)

## 2018-03-18 LAB — LIPASE, BLOOD: Lipase: 877 U/L — ABNORMAL HIGH (ref 11–51)

## 2018-03-18 LAB — GLUCOSE, CAPILLARY
Glucose-Capillary: 86 mg/dL (ref 70–99)
Glucose-Capillary: 86 mg/dL (ref 70–99)

## 2018-03-18 MED ORDER — POLYETHYLENE GLYCOL 3350 17 G PO PACK: 17.0000 g | PACK | Freq: Every day | ORAL | 0 refills | Status: DC

## 2018-03-18 MED ORDER — PANTOPRAZOLE SODIUM 40 MG PO TBEC: 40.0000 mg | DELAYED_RELEASE_TABLET | Freq: Every day | ORAL | 1 refills | Status: DC

## 2018-03-18 MED ORDER — OXYCODONE HCL 5 MG PO TABS: 5.0000 mg | ORAL_TABLET | ORAL | 0 refills | Status: DC | PRN

## 2018-03-18 NOTE — Discharge Summary (Signed)
[facility] Discharge Summary  Patient name: Timothy Martinez Medical record number: [id number] Date of birth: 06-19-1951 Age: 67 y.o. Gender: male Date of Admission: 03/11/2018  Date of Discharge: 03/18/2018 Admitting Physician: No admitting provider for patient encounter.  Primary Care Provider: [name], DO Consultants: GI   Indication for Hospitalization: Acute on chronic pancreatitis with obstructing pseudocyst   Discharge Diagnoses/Problem List:  Patient Active Problem List   Diagnosis Date Noted  . Constipation   . Pseudocyst of pancreas 03/11/2018  . Blood in stool 02/19/2018  . Loss of weight 02/19/2018  . Declined smoking cessation 02/19/2018  . Acute on chronic pancreatitis ([place]) 10/31/2017  . Solitary pulmonary nodule 05/12/2017  . Pancreatitis, alcoholic, acute 36/14/4315  . Chronic atrial fibrillation ([place])   . On continuous oral anticoagulation 06/19/2015  . Chronic left-sided low back pain with sciatica 12/21/2012  . COPD (chronic obstructive pulmonary disease) ([place]) 04/27/2012  . GERD (gastroesophageal reflux disease) 03/05/2012  . Alcohol use disorder, mild, in early remission, abuse 06/09/2011  . Diabetes mellitus ([place]) 05/30/2011  . Chronic alcoholic pancreatitis ([place]) 05/30/2011  . Hyperlipidemia 05/30/2011  . Tobacco abuse 05/30/2011    Disposition: Home   Discharge Condition: Stable   Discharge Exam:  General: Alert, NAD HEENT: NCAT, MMM, oropharynx nonerythematous  Cardiac: RRR no m/g/r Lungs: Clear bilaterally, no increased WOB  Abdomen: soft, TTP over epigastrium, non-distended, normoactive BS Msk: Moves all extremities spontaneously, normal gait  Ext: Warm, dry, 2+ distal pulses, no edema   Brief Hospital Course:  [name] is 67 year old gentleman, with a past history significant for chronic pancreatitis, that presented with nausea and vomiting for 3 days. Afebrile and hemodynamically stable at admission.  CT abdomen showed findings suggestive of a duodenum obstructing 3.1cm pancreatic pseudocyst, without evidence of acute pancreatic inflammation. Lipase 1,526, with baseline around 200. AST 111, ALT 121, Bili wnl. GI was consulted and recommended conservation therapy for pancreatitis, with likely further intervention outpatient, including possible ERCP or EUS. He was managed with IV fluids, pain control, and able to slowly return to a regular diet without further nausea and vomiting. This continued until 8/23, when he developed a lack of appetite and two bouts of emesis, which he attributed to taking oxycodone and tylenol at the same time. This change was also associated with an increase in his LFTs and lipase (600's from 160 a few days prior). GI recommended to monitor labs, which slowly trended back downwards. At discharge, he was hemodynamically stable with minimal abdominal pain or tenderness on exam.   Issues for Follow Up:  1. Ensure follow up with GI, Timothy Martinez, on 9/20 as below, and follow up with pt's appetite/further sx.  2. Recommend obtaining a low dose CT chest scan for lung cancer screening, given smoking history and recent weight loss.   3. For his COPD, patient has been using albuterol daily (+ his Dulera BID) at home for perceived SOB. Did well during admission without SOB. Please re-evaluate symptoms for further controlling medications.    Significant Procedures: None   Significant Labs and Imaging:  Recent Labs  Lab 03/15/18 0524 03/17/18 0921 03/18/18 0659  WBC 9.3 9.1 9.7  HGB 14.5 17.0 14.0  HCT 45.9 52.8* 43.5  PLT 322 359 305   Recent Labs  Lab 03/12/18 0528  03/15/18 0524 03/16/18 0746 03/16/18 1319 03/17/18 0921 03/18/18 0659  NA 138   < > 139 139 138 140 140  K 5.0   < >  4.1 4.5 4.0 3.9 4.0  CL 106   < > 101 100 101 96* 103  CO2 24   < > '31 31 30 26 25  ' GLUCOSE 74   < > 115* 111* 130* 147* 100*  BUN 9   < > '12 11 11 20 ' 27*  CREATININE 0.83   < > 0.81 0.80 0.77  1.21 1.00  CALCIUM 9.3   < > 9.4 9.8 9.6 10.0 9.1  MG 1.3*  --   --   --   --   --   --   PHOS 3.5  --   --   --   --   --   --   ALKPHOS 143*   < > 148* 240* 229* 238* 151*  AST 55*   < > 40 274* 199* 132* 55*  ALT 82*   < > 52* 247* 225* 195* 106*  ALBUMIN 3.5   < > 3.5 3.6 3.6 4.2 3.2*   < > = values in this interval not displayed.   CT abdomen:  IMPRESSION: 1. Abnormal pancreatic head and proximal duodenum with an appearance most suggestive of an obstructing pseudocyst measuring 3.1 cm. This has progressed since 10/31/2017 and now results in high-grade impingement on the lumen of the 2nd portion of the duodenum. Endoscopy may be most valuable at this point. 2. Chronic but increased intrahepatic biliary ductal dilatation. Gallbladder distention without CT evidence of acute cholecystitis. 3. Chronic calcific pancreatitis with no acute pancreatic inflammation today. No peripancreatic lymphadenopathy. 4. Surgically absent spleen. 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 5. Chronic lower lobe lung disease. 6.  Aortic Atherosclerosis (ICD10-I70.0).  RUQ ultrasound:  IMPRESSION: 1. Normal sonographic evaluation of the gallbladder. No cholelithiasis or evidence for acute cholecystitis. 2. Mild intrahepatic biliary dilatation. CBD within normal limits measuring 3.5 mm.  Results/Tests Pending at Time of Discharge: None   Discharge Medications:  Allergies as of 03/18/2018      Reactions   Prozac [fluoxetine Hcl] Nausea Only   Made the patient very sick to his stomach   Morphine And Related Swelling   Pt states he can tolerate oxycodone and hydromorphone   Penicillins Rash   Has patient had a PCN reaction causing immediate rash, facial/tongue/throat swelling, SOB or lightheadedness with hypotension: Yes Has patient had a PCN reaction causing severe rash involving mucus membranes or skin necrosis: No Has patient had a PCN reaction  that required hospitalization: No Has patient had a PCN reaction occurring within the last 10 years: No If all of the above answers are "NO", then may proceed with Cephalosporin use.      Medication List    STOP taking these medications   famotidine 40 MG tablet Commonly known as:  PEPCID     TAKE these medications   albuterol 108 (90 Base) MCG/ACT inhaler Commonly known as:  PROVENTIL HFA;VENTOLIN HFA Inhale 1-2 puffs into the lungs every 6 (six) hours as needed for wheezing or shortness of breath.   amitriptyline 10 MG tablet Commonly known as:  ELAVIL TAKE 1 TABLET BY MOUTH EVERYDAY AT BEDTIME What changed:  See the new instructions.   atorvastatin 40 MG tablet Commonly known as:  LIPITOR Take 1 tablet (40 mg total) by mouth daily at 6 PM.   gabapentin 100 MG capsule Commonly known as:  NEURONTIN TAKE 1 CAPSULE BY MOUTH THREE TIMES A DAY What changed:  See the new instructions.  glucose blood test strip Use check fasting blood glucose qAM. Type 2 Diabetes E11.9   metFORMIN 1000 MG tablet Commonly known as:  GLUCOPHAGE Take 1 tablet (1,000 mg total) by mouth 2 (two) times daily with a meal.   metoprolol tartrate 50 MG tablet Commonly known as:  LOPRESSOR Take 1.5 tablets (75 mg total) by mouth 2 (two) times daily.   mometasone-formoterol 200-5 MCG/ACT Aero Commonly known as:  DULERA Inhale 2 puffs into the lungs 2 (two) times daily.   nicotine 7 mg/24hr patch Commonly known as:  NICODERM CQ - dosed in mg/24 hr Place 1 patch (7 mg total) onto the skin daily. What changed:    when to take this  reasons to take this   ondansetron 4 MG tablet Commonly known as:  ZOFRAN Take 1 tablet (4 mg total) by mouth every 8 (eight) hours as needed for nausea or vomiting.   ONE TOUCH LANCETS Misc Check fasting blood glucose qAM. Type 2 DM. E11.9   ONE TOUCH ULTRA 2 w/Device Kit Dispense one glucometer. Diagnosis Type 2 Diabetes. E11.9   oxyCODONE 5 MG immediate  release tablet Commonly known as:  Oxy IR/ROXICODONE Take 1 tablet (5 mg total) by mouth every 4 (four) hours as needed for severe pain.   pantoprazole 40 MG tablet Commonly known as:  PROTONIX Take 1 tablet (40 mg total) by mouth daily.   polyethylene glycol packet Commonly known as:  MIRALAX / GLYCOLAX Take 17 g by mouth daily.   rivaroxaban 20 MG Tabs tablet Commonly known as:  XARELTO Take 1 tablet (20 mg total) by mouth daily with supper.   vitamin B-12 500 MCG tablet Commonly known as:  CYANOCOBALAMIN Take 500 mcg by mouth daily.       Discharge Instructions: Please refer to Patient Instructions section of EMR for full details.  Patient was counseled important signs and symptoms that should prompt return to medical care, changes in medications, dietary instructions, activity restrictions, and follow up appointments.   Follow-Up Appointments: Follow-up Information    Mansouraty, Telford Nab., MD Follow up on 04/13/2018.   Specialties:  Gastroenterology, Internal Medicine Why:  8:45 AM appt with Dr Jerilynn Martinez for follow up of pancreatitis and for follow up of blood detected on stool test.  he will be arranging future colonoscopy at that visit.  this replaces appointment you had with [facility] for 04/24/18.   Contact information: [place] [address] [phone]        Timothy Alpha, DO. Schedule an appointment as soon as possible for a visit in 1 week(s).   Specialty:  Family Medicine Contact information: [address] [address] [place], [place], DO 03/18/2018, 10:25 PM PGY-1, [place]

## 2018-03-18 NOTE — Progress Notes (Addendum)
Family Medicine Teaching Service Daily Progress Note Intern Pager: [phone]  Patient name: Timothy Martinez Medical record number: [id number] Date of birth: February 15, 1951 Age: 66 y.o. Gender: male  Primary Care Provider: [name], DO Consultants: GI  Code Status: Full  Assessment and Plan: Timothy Rosas Sluderis a 67 y.o.malepresenting with N/V x3 days, found to have acute on chronic pancreatitis with obstructing pancreatic pseudocyst. PMH is significant forchronic pancreatitis, type 2 diabetes, COPD, chronic atrial fibrillation, GERD, hyperlipidemia, chronic lower back pain with sciatica, solitary pulmonary nodule, and tobacco use.   Acute onchronicpancreatitiswithduodenumobstructing pseudocyst:Improving.  Patient feels well this morning, although still no appetite.  He would like to go home if able.  Mild TTP over epigastric region, no rebound or guarding noted.  LFTs downtrending on AM labs with AST 132>55, ALT 195>106.  Tbili 1.8>1.4 and alk phos 238>151.  He is agreeable to try to eat breakfast this morning.  Previous plan to consult GI given elevated lipase to 623, however will hold off on consult this morning and advance diet instead. GI has set up a follow-up outpatient appointment for further evaluation, as felt no intervention needed on this admission.  - transition to soft diet this AM  -can d/c fluids later today if tolerating po, with likely plan for dc home -Cont home famotidine  - zofran prn  -Pain control Tylenol and oxycodone prn  AKI: Resolved.  Cr 1.21>1.0. In the setting of recent bouts of emesis and large bowel movements with poor PO intake yesterday.  -gentle hydration -monitor CMP   Constipation:Resolved. Enema x2 on 8/23  with large bowel movement following.  -Cont miralax and colace BID  Type 2 Diabetes: A1c 6.1 in 01/2018.Glucoseranging 86-110.Takes metformin 1023m BID at home.  -sSSI -Monitor glucose q4  ParoxysmalAtrial Fibrillations/p  Cardioversion:Stable, follows with Dr[facility]ie [facility].Appears to be in sinus rhythm on examand israte controlled. S/p Cardioversion in 08/2014.Takes metoprolol 50 mg BID and Xarelto 20 mg at home.  -Cont home metoprolol 557mBID -Cont home Xarelto   Recent Weight loss: Pt endorses a 20 lb weight loss with night sweats in the past approximately 6 months, despite his normal diet and activity. His PCP has recently recommended adding ensure BID with meals for weight gain. Concern for possible underlying lung vs GI malignancy, however likely chronic pancreatitis is contributing.  -Set upa screening low dose CT outpatient -Monitor weight  COPD:Stableon RA, uses Dulera BID and albuterol as needed at home. However, has been using his albuterol daily for coughing and perceived SOB.He currently denies any SOB. Recommend initiating further controller medications, such as LAMA, outpatient.  -Cont home dulera BID  -Albuterol as needed for SOB   Chronic lower back pain with sciatica:Stable.Does not use any chronic pain medications,and stopped taking his home gabapentin. -Hold home gabapentin -hasoxycodonePRN for pancreatitis pain, as above -ContKPad  GERD: Stable. -continue home famotidine   Hyperlipidemia: Lipid panel in 10/2017 wnl. Takes Atorvastatin 40 mg daily at home.  -Restart home atorvastatin   Alcohol usEUM:PNTIRWEormer h/o drinking beer, never any hard liquor.  -monitor onCIWA; 0'sovernight  Tobacco UsRXV:QMGQQP-2 PPD, offered nicotine patch, pt declined at admission.  History of solitary pulmonary nodule: Small stable 67m47module in the LL base last noted in 10/2017 CT abdomen, however not noted in8/18CT abdomen, possibly not high enough for visualization.  -Cont to monitor  FEN/GI: soft diet, IV NS '@125'  cc.hr PPx: home xarelto    Disposition: plan for dc home today if tolerating po  Subjective:  Patient  reports feeling well this morning and  would like to go home.  Endorses mild abdominal tenderness, has been up out of bed and is walking the halls without discomfort.  Does not have an appetite but is agreeable to trying to eat some breakfast this morning once advanced.    Objective: Temp:  [97.7 F (36.5 C)-98.1 F (36.7 C)] 98.1 F (36.7 C) (08/25 0410) Pulse Rate:  [60-78] 60 (08/25 0410) Resp:  [16] 16 (08/25 0410) BP: (121-126)/(67-78) 121/69 (08/25 0410) SpO2:  [96 %-98 %] 96 % (08/25 0803)   Physical Exam: General: Alert. Pleasant 67 yo male, NAD HEENT: NCAT, MMM, oropharynx nonerythematous  Cardiac: RRR no MRG  Lungs: Clear bilaterally, no increased WOB on RA  Abdomen: soft, TTP over epigastrium, non-distended, normoactive BS Msk: Moves all extremities spontaneously, normal gait  Ext: Warm, dry, 2+ distal pulses, no edema or tenderness   Laboratory: Recent Labs  Lab 03/15/18 0524 03/17/18 0921 03/18/18 0659  WBC 9.3 9.1 9.7  HGB 14.5 17.0 14.0  HCT 45.9 52.8* 43.5  PLT 322 359 305   Recent Labs  Lab 03/16/18 1319 03/17/18 0921 03/18/18 0659  NA 138 140 140  K 4.0 3.9 4.0  CL 101 96* 103  CO2 '30 26 25  ' BUN 11 20 27*  CREATININE 0.77 1.21 1.00  CALCIUM 9.6 10.0 9.1  PROT 7.6 8.7* 6.8  BILITOT 1.2 1.8* 1.4*  ALKPHOS 229* 238* 151*  ALT 225* 195* 106*  AST 199* 132* 55*  GLUCOSE 130* 147* 100*     Imaging/Diagnostic Tests: No results found.   Lovenia Kim, MD 03/18/2018, 8:50 AM PGY-3, [place] Intern pager: [phone], text pages welcome

## 2018-03-19 NOTE — Telephone Encounter (Signed)
Left message on machine to call back  

## 2018-03-20 ENCOUNTER — Telehealth: Payer: Self-pay

## 2018-03-20 ENCOUNTER — Other Ambulatory Visit: Payer: Self-pay

## 2018-03-20 ENCOUNTER — Encounter: Payer: Self-pay | Admitting: Family Medicine

## 2018-03-20 ENCOUNTER — Other Ambulatory Visit: Payer: Self-pay | Admitting: Family Medicine

## 2018-03-20 ENCOUNTER — Ambulatory Visit (INDEPENDENT_AMBULATORY_CARE_PROVIDER_SITE_OTHER): Payer: Medicare Other | Admitting: Family Medicine

## 2018-03-20 VITALS — BP 122/78 | HR 67 | Temp 97.7°F | Ht 75.0 in | Wt 148.8 lb

## 2018-03-20 DIAGNOSIS — K863 Pseudocyst of pancreas: Secondary | ICD-10-CM | POA: Diagnosis not present

## 2018-03-20 DIAGNOSIS — Z122 Encounter for screening for malignant neoplasm of respiratory organs: Secondary | ICD-10-CM

## 2018-03-20 MED ORDER — OXYCODONE HCL 5 MG PO CAPS: 5.0000 mg | ORAL_CAPSULE | ORAL | 0 refills | Status: DC | PRN

## 2018-03-20 MED ORDER — ONDANSETRON HCL 4 MG PO TABS: 4.0000 mg | ORAL_TABLET | Freq: Three times a day (TID) | ORAL | 0 refills | Status: DC | PRN

## 2018-03-20 NOTE — Progress Notes (Signed)
Patient did not have prescription for Zofran at pharmacy. I will refill and send today.

## 2018-03-20 NOTE — Assessment & Plan Note (Signed)
Continued weight loss given increase in protein intake by drinking Ensure with no other dietary changes. Will get CT chest low dose given smoking history.

## 2018-03-20 NOTE — Telephone Encounter (Signed)
Pt left message on nurse line requesting a call back from Dr. Garlan Fillers. Call back [phone] Wallace Cullens, RN

## 2018-03-20 NOTE — Progress Notes (Signed)
Subjective:  HPI: Timothy Martinez is a 67 y.o. presenting to clinic today to discuss the following:  Hospital Follow Up for Pancreatic Pseudocyst Patient was recently admitted to the hospital and discharged on 8/25 due to a pancreatic pseudocyst. He was treated with conservative management and is if here for follow up. He states he has improved but still has significant nausea and one episode of emesis since hospital discharge. He is also still having significant episodes of abdominal pain. He is tolerating soft foods such as jello, soft fruits like bananas, etc.  Weight Loss Patient was drinking Ensure several times a day daily as instructed but is down 10lbs since last visit. Difficult to determine how much weight loss is due to his recent episode of pancreatic pseudocyst which decreased his intake and gave him significant nausea and vomiting. Given smoking history I will get a chest CT low dose to screen for lung cancer. Patient is having GI follow up and with get ERCP and colonoscopy to search for other sources.  Health Maintenance: none due     ROS noted in HPI.   Past Medical, Surgical, Social, and Family History Reviewed & Updated per EMR.   Pertinent Historical Findings include:   Social History   Tobacco Use  Smoking Status Current Every Day Smoker  . Packs/day: 1.00  . Years: 52.00  . Pack years: 52.00  . Types: Cigarettes  . Start date: 04/09/1964  Smokeless Tobacco Never Used  Tobacco Comment   down from 2.5 ppd    Objective: BP 122/78   Pulse 67   Temp 97.7 F (36.5 C) (Oral)   Ht '6\' 3"'  (1.905 m)   Wt 148 lb 12.8 oz (67.5 kg)   SpO2 96%   BMI 18.60 kg/m  Vitals and nursing notes reviewed  Physical Exam Gen: Alert and Oriented x 3, NAD HEENT: Normocephalic, atraumatic, PERRLA, EOMI CV: RRR, no murmurs, normal S1, S2 split Resp: Decreased lung sounds diffusely, no wheezing, rales, or rhonchi, comfortable work of breathing Abd: non-distended,  epigastric and RUQ TTP, soft, +bs in all four quadrants Ext: no clubbing, cyanosis, or edema Neuro: No gross deficits Skin: warm, dry, intact, no rashes  Results for orders placed or performed during the hospital encounter of 03/11/18 (from the past 72 hour(s))  Comprehensive metabolic panel     Status: Abnormal   Collection Time: 03/17/18  9:21 AM  Result Value Ref Range   Sodium 140 135 - 145 mmol/L   Potassium 3.9 3.5 - 5.1 mmol/L   Chloride 96 (L) 98 - 111 mmol/L   CO2 26 22 - 32 mmol/L   Glucose, Bld 147 (H) 70 - 99 mg/dL   BUN 20 8 - 23 mg/dL   Creatinine, Ser 1.21 0.61 - 1.24 mg/dL   Calcium 10.0 8.9 - 10.3 mg/dL   Total Protein 8.7 (H) 6.5 - 8.1 g/dL   Albumin 4.2 3.5 - 5.0 g/dL   AST 132 (H) 15 - 41 U/L   ALT 195 (H) 0 - 44 U/L   Alkaline Phosphatase 238 (H) 38 - 126 U/L   Total Bilirubin 1.8 (H) 0.3 - 1.2 mg/dL   GFR calc non Af Amer >60 >60 mL/min   GFR calc Af Amer >60 >60 mL/min    Comment: (NOTE) The eGFR has been calculated using the CKD EPI equation. This calculation has not been validated in all clinical situations. eGFR's persistently <60 mL/min signify possible Chronic Kidney Disease.    Anion  gap 18 (H) 5 - 15    Comment: Performed at [facility], [place] [address], [place], [place] [address]  CBC with Differential/Platelet     Status: Abnormal   Collection Time: 03/17/18  9:21 AM  Result Value Ref Range   WBC 9.1 4.0 - 10.5 K/uL   RBC 5.60 4.22 - 5.81 MIL/uL   Hemoglobin 17.0 13.0 - 17.0 g/dL   HCT 52.8 (H) 39.0 - 52.0 %   MCV 94.3 78.0 - 100.0 fL   MCH 30.4 26.0 - 34.0 pg   MCHC 32.2 30.0 - 36.0 g/dL   RDW 13.9 11.5 - 15.5 %   Platelets 359 150 - 400 K/uL   Neutrophils Relative % 62 %   Neutro Abs 5.6 1.7 - 7.7 K/uL   Lymphocytes Relative 26 %   Lymphs Abs 2.3 0.7 - 4.0 K/uL   Monocytes Relative 10 %   Monocytes Absolute 1.0 0.1 - 1.0 K/uL   Eosinophils Relative 1 %   Eosinophils Absolute 0.1 0.0 - 0.7 K/uL   Basophils Relative 1 %    Basophils Absolute 0.1 0.0 - 0.1 K/uL   Immature Granulocytes 0 %   Abs Immature Granulocytes 0.0 0.0 - 0.1 K/uL    Comment: Performed at [facility] [address], [place], [place] [address]  Lipase, blood     Status: Abnormal   Collection Time: 03/17/18  9:21 AM  Result Value Ref Range   Lipase 623 (H) 11 - 51 U/L    Comment: RESULTS CONFIRMED BY MANUAL DILUTION Performed at [facility], [place] [address], [place], [place] [address]   Glucose, capillary     Status: Abnormal   Collection Time: 03/17/18 12:03 PM  Result Value Ref Range   Glucose-Capillary 102 (H) 70 - 99 mg/dL  Glucose, capillary     Status: Abnormal   Collection Time: 03/17/18  4:30 PM  Result Value Ref Range   Glucose-Capillary 109 (H) 70 - 99 mg/dL  Glucose, capillary     Status: None   Collection Time: 03/17/18  8:45 PM  Result Value Ref Range   Glucose-Capillary 81 70 - 99 mg/dL  Comprehensive metabolic panel     Status: Abnormal   Collection Time: 03/18/18  6:59 AM  Result Value Ref Range   Sodium 140 135 - 145 mmol/L   Potassium 4.0 3.5 - 5.1 mmol/L   Chloride 103 98 - 111 mmol/L   CO2 25 22 - 32 mmol/L   Glucose, Bld 100 (H) 70 - 99 mg/dL   BUN 27 (H) 8 - 23 mg/dL   Creatinine, Ser 1.00 0.61 - 1.24 mg/dL   Calcium 9.1 8.9 - 10.3 mg/dL   Total Protein 6.8 6.5 - 8.1 g/dL   Albumin 3.2 (L) 3.5 - 5.0 g/dL   AST 55 (H) 15 - 41 U/L   ALT 106 (H) 0 - 44 U/L   Alkaline Phosphatase 151 (H) 38 - 126 U/L   Total Bilirubin 1.4 (H) 0.3 - 1.2 mg/dL   GFR calc non Af Amer >60 >60 mL/min   GFR calc Af Amer >60 >60 mL/min    Comment: (NOTE) The eGFR has been calculated using the CKD EPI equation. This calculation has not been validated in all clinical situations. eGFR's persistently <60 mL/min signify possible Chronic Kidney Disease.    Anion gap 12 5 - 15    Comment: Performed at [facility] [address] [place], [place] [address]  CBC with Differential/Platelet     Status: Abnormal     Collection Time: 03/18/18  6:59 AM  Result Value Ref Range   WBC 9.7 4.0 - 10.5 K/uL   RBC 4.65 4.22 - 5.81 MIL/uL   Hemoglobin 14.0 13.0 - 17.0 g/dL   HCT 43.5 39.0 - 52.0 %   MCV 93.5 78.0 - 100.0 fL   MCH 30.1 26.0 - 34.0 pg   MCHC 32.2 30.0 - 36.0 g/dL   RDW 13.8 11.5 - 15.5 %   Platelets 305 150 - 400 K/uL   Neutrophils Relative % 58 %   Neutro Abs 5.6 1.7 - 7.7 K/uL   Lymphocytes Relative 23 %   Lymphs Abs 2.2 0.7 - 4.0 K/uL   Monocytes Relative 13 %   Monocytes Absolute 1.2 (H) 0.1 - 1.0 K/uL   Eosinophils Relative 4 %   Eosinophils Absolute 0.4 0.0 - 0.7 K/uL   Basophils Relative 2 %   Basophils Absolute 0.2 (H) 0.0 - 0.1 K/uL   Immature Granulocytes 0 %   Abs Immature Granulocytes 0.0 0.0 - 0.1 K/uL    Comment: Performed at [facility], [address] [place], [place] [address]  Lipase, blood     Status: Abnormal   Collection Time: 03/18/18  6:59 AM  Result Value Ref Range   Lipase 877 (H) 11 - 51 U/L    Comment: RESULTS CONFIRMED BY MANUAL DILUTION Performed at [facility], [place] [address] [place], [place] [address]   Glucose, capillary     Status: None   Collection Time: 03/18/18  8:13 AM  Result Value Ref Range   Glucose-Capillary 86 70 - 99 mg/dL  Glucose, capillary     Status: None   Collection Time: 03/18/18 12:54 PM  Result Value Ref Range   Glucose-Capillary 86 70 - 99 mg/dL    Assessment/Plan:  Pancreatic pseudocyst Overall, patient is stable and improving. He is tolerating food with only one episode of emesis since discharge. Still having significant pain consistent with pancreatic inflammation/pseudocyst. I have given him 10 oxycodone tablets to last him until 9/20 when he has GI follow up. Plan from GI is to get an ERCP. I will see him after. Patient did not pick up Zofran so I have informed him to pick it up today from pharmacy to help control nausea/vomiting.  Plan to slowly advance diet, eat smaller portions but more frequently  during the day as tolerated. Encouraged patient to eat anything but try to consume more protein.  Encounter for screening for malignant neoplasm of respiratory organs Continued weight loss given increase in protein intake by drinking Ensure with no other dietary changes. Will get CT chest low dose given smoking history.   PATIENT EDUCATION PROVIDED: See AVS    Diagnosis and plan along with any newly prescribed medication(s) were discussed in detail with this patient today. The patient verbalized understanding and agreed with the plan. Patient advised if symptoms worsen return to clinic or ER.   Health Maintainance:   Orders Placed This Encounter  Procedures  . CT CHEST LUNG CA SCREEN LOW DOSE W/O CM    Standing Status:   Future    Standing Expiration Date:   03/21/2019    Order Specific Question:   Reason for Exam (SYMPTOM  OR DIAGNOSIS REQUIRED)    Answer:   smoker    Order Specific Question:   Preferred Imaging Location?    Answer:   [facility]  Order Specific Question:   Radiology Contrast Protocol - do NOT remove file path    Answer:   [email or web address]    Meds ordered this encounter  Medications  . oxycodone (OXY-IR) 5 MG capsule    Sig: Take 1 capsule (5 mg total) by mouth every 4 (four) hours as needed for up to 10 days.    Dispense:  10 capsule    Refill:  0     Harolyn Rutherford, DO 03/20/2018, 8:31 AM PGY-2 [place]

## 2018-03-20 NOTE — Patient Instructions (Signed)
It was great to see you today! Thank you for letting me participate in your care!  Today, we discussed your recent hospitalization due to a pancreatic pseudocyst. I have given you 10 more oxycodone 5mg  pills. Please use them sparingly and with caution. I have scheduled you for a CT scan of your lungs due to your smoking history. Please make sure you go to [facility] to have this done.   Don't forget to pick up Ondansetron also known as Zofran.   Please follow up with GI and return to the clinic once you have been seen by them.  Be well, Harolyn Rutherford, DO PGY-2, [facility]

## 2018-03-20 NOTE — Assessment & Plan Note (Addendum)
Overall, patient is stable and improving. He is tolerating food with only one episode of emesis since discharge. Still having significant pain consistent with pancreatic inflammation/pseudocyst. I have given him 10 oxycodone tablets to last him until 9/20 when he has GI follow up. Plan from GI is to get an ERCP. I will see him after. Patient did not pick up Zofran so I have informed him to pick it up today from pharmacy to help control nausea/vomiting.  Plan to slowly advance diet, eat smaller portions but more frequently during the day as tolerated. Encouraged patient to eat anything but try to consume more protein.

## 2018-03-21 ENCOUNTER — Encounter (HOSPITAL_COMMUNITY): Payer: Self-pay | Admitting: Emergency Medicine

## 2018-03-21 ENCOUNTER — Inpatient Hospital Stay (HOSPITAL_COMMUNITY)
Admission: EM | Admit: 2018-03-21 | Discharge: 2018-03-23 | DRG: 309 | Disposition: A | Discharge status: Home / Self Care | Payer: Medicare Other | Attending: Family Medicine | Admitting: Family Medicine

## 2018-03-21 ENCOUNTER — Other Ambulatory Visit: Payer: Self-pay

## 2018-03-21 ENCOUNTER — Emergency Department (HOSPITAL_COMMUNITY): Payer: Medicare Other

## 2018-03-21 DIAGNOSIS — Z7984 Long term (current) use of oral hypoglycemic drugs: Secondary | ICD-10-CM

## 2018-03-21 DIAGNOSIS — J449 Chronic obstructive pulmonary disease, unspecified: Secondary | ICD-10-CM | POA: Diagnosis present

## 2018-03-21 DIAGNOSIS — K863 Pseudocyst of pancreas: Secondary | ICD-10-CM | POA: Diagnosis present

## 2018-03-21 DIAGNOSIS — Z79899 Other long term (current) drug therapy: Secondary | ICD-10-CM | POA: Diagnosis not present

## 2018-03-21 DIAGNOSIS — F17210 Nicotine dependence, cigarettes, uncomplicated: Secondary | ICD-10-CM | POA: Diagnosis not present

## 2018-03-21 DIAGNOSIS — Q8901 Asplenia (congenital): Secondary | ICD-10-CM

## 2018-03-21 DIAGNOSIS — Z7901 Long term (current) use of anticoagulants: Secondary | ICD-10-CM

## 2018-03-21 DIAGNOSIS — E8809 Other disorders of plasma-protein metabolism, not elsewhere classified: Secondary | ICD-10-CM | POA: Diagnosis not present

## 2018-03-21 DIAGNOSIS — M5442 Lumbago with sciatica, left side: Secondary | ICD-10-CM | POA: Diagnosis present

## 2018-03-21 DIAGNOSIS — E43 Unspecified severe protein-calorie malnutrition: Secondary | ICD-10-CM

## 2018-03-21 DIAGNOSIS — Z833 Family history of diabetes mellitus: Secondary | ICD-10-CM

## 2018-03-21 DIAGNOSIS — Z7951 Long term (current) use of inhaled steroids: Secondary | ICD-10-CM | POA: Diagnosis not present

## 2018-03-21 DIAGNOSIS — E876 Hypokalemia: Secondary | ICD-10-CM | POA: Diagnosis not present

## 2018-03-21 DIAGNOSIS — Z9081 Acquired absence of spleen: Secondary | ICD-10-CM

## 2018-03-21 DIAGNOSIS — E1142 Type 2 diabetes mellitus with diabetic polyneuropathy: Secondary | ICD-10-CM | POA: Diagnosis present

## 2018-03-21 DIAGNOSIS — I480 Paroxysmal atrial fibrillation: Secondary | ICD-10-CM | POA: Diagnosis not present

## 2018-03-21 DIAGNOSIS — I10 Essential (primary) hypertension: Secondary | ICD-10-CM | POA: Diagnosis present

## 2018-03-21 DIAGNOSIS — R9431 Abnormal electrocardiogram [ECG] [EKG]: Secondary | ICD-10-CM

## 2018-03-21 DIAGNOSIS — K219 Gastro-esophageal reflux disease without esophagitis: Secondary | ICD-10-CM | POA: Diagnosis present

## 2018-03-21 DIAGNOSIS — D649 Anemia, unspecified: Secondary | ICD-10-CM | POA: Diagnosis present

## 2018-03-21 DIAGNOSIS — E785 Hyperlipidemia, unspecified: Secondary | ICD-10-CM | POA: Diagnosis present

## 2018-03-21 DIAGNOSIS — I4891 Unspecified atrial fibrillation: Secondary | ICD-10-CM

## 2018-03-21 DIAGNOSIS — K861 Other chronic pancreatitis: Secondary | ICD-10-CM

## 2018-03-21 DIAGNOSIS — Z8249 Family history of ischemic heart disease and other diseases of the circulatory system: Secondary | ICD-10-CM | POA: Diagnosis not present

## 2018-03-21 DIAGNOSIS — G8929 Other chronic pain: Secondary | ICD-10-CM | POA: Diagnosis not present

## 2018-03-21 DIAGNOSIS — I959 Hypotension, unspecified: Secondary | ICD-10-CM | POA: Diagnosis not present

## 2018-03-21 DIAGNOSIS — K5669 Other partial intestinal obstruction: Secondary | ICD-10-CM

## 2018-03-21 HISTORY — DX: Asplenia (congenital): Q89.01

## 2018-03-21 LAB — CBC
HCT: 49.5 % (ref 39.0–52.0)
Hemoglobin: 16.1 g/dL (ref 13.0–17.0)
MCH: 30.4 pg (ref 26.0–34.0)
MCHC: 32.5 g/dL (ref 30.0–36.0)
MCV: 93.6 fL (ref 78.0–100.0)
Platelets: 316 10*3/uL (ref 150–400)
RBC: 5.29 MIL/uL (ref 4.22–5.81)
RDW: 13.7 % (ref 11.5–15.5)
WBC: 10.6 10*3/uL — ABNORMAL HIGH (ref 4.0–10.5)

## 2018-03-21 LAB — COMPREHENSIVE METABOLIC PANEL
ALT: 133 U/L — ABNORMAL HIGH (ref 0–44)
AST: 96 U/L — ABNORMAL HIGH (ref 15–41)
Albumin: 4 g/dL (ref 3.5–5.0)
Alkaline Phosphatase: 218 U/L — ABNORMAL HIGH (ref 38–126)
Anion gap: 12 (ref 5–15)
BUN: 26 mg/dL — ABNORMAL HIGH (ref 8–23)
CO2: 32 mmol/L (ref 22–32)
Calcium: 9.9 mg/dL (ref 8.9–10.3)
Chloride: 97 mmol/L — ABNORMAL LOW (ref 98–111)
Creatinine, Ser: 1.19 mg/dL (ref 0.61–1.24)
GFR calc Af Amer: >60 mL/min (ref >60)
GFR calc non Af Amer: >60 mL/min (ref >60)
Glucose, Bld: 138 mg/dL — ABNORMAL HIGH (ref 70–99)
Potassium: 3.5 mmol/L (ref 3.5–5.1)
Sodium: 141 mmol/L (ref 135–145)
Total Bilirubin: 1.2 mg/dL (ref 0.3–1.2)
Total Protein: 8 g/dL (ref 6.5–8.1)

## 2018-03-21 LAB — I-STAT TROPONIN, ED: Troponin i, poc: 0.03 ng/mL (ref 0.00–0.08)

## 2018-03-21 LAB — LIPASE, BLOOD: Lipase: 180 U/L — ABNORMAL HIGH (ref 11–51)

## 2018-03-21 MED ORDER — AMITRIPTYLINE HCL 10 MG PO TABS
10.0000 mg | ORAL_TABLET | Freq: Every day | ORAL | Status: DC
  Administered 2018-03-22: 10 mg via ORAL
  Filled 2018-03-21 (×2): qty 1

## 2018-03-21 MED ORDER — OXYCODONE HCL 5 MG PO TABS
5.0000 mg | ORAL_TABLET | ORAL | Status: DC | PRN
  Administered 2018-03-22: 5 mg via ORAL
  Filled 2018-03-21: qty 1

## 2018-03-21 MED ORDER — HYDROMORPHONE HCL 1 MG/ML IJ SOLN
1.0000 mg | Freq: Once | INTRAMUSCULAR | Status: AC
  Administered 2018-03-21: 1 mg via INTRAVENOUS
  Filled 2018-03-21: qty 1

## 2018-03-21 MED ORDER — SODIUM CHLORIDE 0.9 % IV BOLUS
1000.0000 mL | Freq: Once | INTRAVENOUS | Status: AC
  Administered 2018-03-21: 1000 mL via INTRAVENOUS

## 2018-03-21 MED ORDER — DILTIAZEM HCL-DEXTROSE 100-5 MG/100ML-% IV SOLN (PREMIX)
5.0000 mg/h | Freq: Once | INTRAVENOUS | Status: AC
  Administered 2018-03-21: 5 mg/h via INTRAVENOUS
  Filled 2018-03-21: qty 100

## 2018-03-21 MED ORDER — RIVAROXABAN 20 MG PO TABS
20.0000 mg | ORAL_TABLET | Freq: Every day | ORAL | Status: DC
  Administered 2018-03-22: 20 mg via ORAL
  Filled 2018-03-21: qty 1

## 2018-03-21 MED ORDER — ALBUTEROL SULFATE (2.5 MG/3ML) 0.083% IN NEBU: 3.0000 mL | INHALATION_SOLUTION | Freq: Four times a day (QID) | RESPIRATORY_TRACT | Status: DC | PRN

## 2018-03-21 MED ORDER — DILTIAZEM HCL 25 MG/5ML IV SOLN
10.0000 mg | Freq: Once | INTRAVENOUS | Status: AC
  Administered 2018-03-21: 10 mg via INTRAVENOUS
  Filled 2018-03-21: qty 5

## 2018-03-21 MED ORDER — ONDANSETRON HCL 4 MG PO TABS
4.0000 mg | ORAL_TABLET | Freq: Three times a day (TID) | ORAL | Status: DC | PRN
  Administered 2018-03-22: 4 mg via ORAL
  Filled 2018-03-21 (×2): qty 1

## 2018-03-21 MED ORDER — CYANOCOBALAMIN 500 MCG PO TABS
500.0000 ug | ORAL_TABLET | Freq: Every day | ORAL | Status: DC
  Administered 2018-03-22 – 2018-03-23 (×2): 500 ug via ORAL
  Filled 2018-03-21 (×2): qty 1

## 2018-03-21 MED ORDER — GABAPENTIN 100 MG PO CAPS: 100.0000 mg | ORAL_CAPSULE | Freq: Three times a day (TID) | ORAL | Status: DC | PRN

## 2018-03-21 MED ORDER — PANTOPRAZOLE SODIUM 40 MG PO TBEC
40.0000 mg | DELAYED_RELEASE_TABLET | Freq: Every day | ORAL | Status: DC
  Administered 2018-03-22 – 2018-03-23 (×2): 40 mg via ORAL
  Filled 2018-03-21 (×2): qty 1

## 2018-03-21 MED ORDER — DILTIAZEM HCL-DEXTROSE 100-5 MG/100ML-% IV SOLN (PREMIX): 5.0000 mg/h | Freq: Once | INTRAVENOUS | Status: DC

## 2018-03-21 MED ORDER — METOPROLOL TARTRATE 25 MG PO TABS: 75.0000 mg | ORAL_TABLET | Freq: Two times a day (BID) | ORAL | Status: DC

## 2018-03-21 MED ORDER — MOMETASONE FURO-FORMOTEROL FUM 200-5 MCG/ACT IN AERO
2.0000 | INHALATION_SPRAY | Freq: Two times a day (BID) | RESPIRATORY_TRACT | Status: DC
  Administered 2018-03-22 – 2018-03-23 (×3): 2 via RESPIRATORY_TRACT
  Filled 2018-03-21 (×2): qty 8.8

## 2018-03-21 MED ORDER — INSULIN ASPART 100 UNIT/ML ~~LOC~~ SOLN
0.0000 [IU] | Freq: Three times a day (TID) | SUBCUTANEOUS | Status: DC
  Administered 2018-03-22 – 2018-03-23 (×2): 1 [IU] via SUBCUTANEOUS

## 2018-03-21 MED ORDER — ONDANSETRON HCL 4 MG/2ML IJ SOLN
4.0000 mg | Freq: Once | INTRAMUSCULAR | Status: AC
  Administered 2018-03-21: 4 mg via INTRAVENOUS
  Filled 2018-03-21: qty 2

## 2018-03-21 NOTE — Telephone Encounter (Signed)
Left message on machine to call back  

## 2018-03-21 NOTE — ED Provider Notes (Signed)
[facility] EMERGENCY DEPARTMENT Provider Note   CSN: [id number] Arrival date & time: 03/21/18  2104     History   Chief Complaint Chief Complaint  Patient presents with  . Abdominal Pain    HPI Timothy Martinez is a 66 y.o. male history of pancreatitis, previous alcohol abuse, anxiety and depression, here presenting with abdominal pain.  Patient was recently admitted for pancreatitis and was diagnosed with pseudocyst with obstruction.  Patient was just from the hospital 3 days ago.  Patient initially felt fine but since yesterday had nausea and vomiting and severe abdominal pain.  He took his Zofran and Percocet with no relief.  Patient denies alcohol use for the last several years.  He was supposed to get outpatient GI follow-up and endoscopy but did not get it yet.   The history is provided by the patient.    Past Medical History:  Diagnosis Date  . Acute on chronic pancreatitis ([place]) 05/17/2016  . Alcohol abuse   . Alcoholic pancreatitis   . Anginal pain ([place])   . Anxiety and depression 01/20/2015   PHQ 9 = 11 (01/20/15)    . Arthritis    "eat up w/it" (05/17/2016)  . Atrial fibrillation status post cardioversion ([place]) 08/2014  . Atrial flutter, unspecified   . CHF (congestive heart failure) ([place])   . Chronic anticoagulation   . Chronic bronchitis ([place])   . COLD (chronic obstructive lung disease) ([place])   . Daily headache    "need eye examined" (05/17/2016)  . GERD (gastroesophageal reflux disease)   . Hematuria, microscopic 02/13/2015   Noted on UA - Recheck UA at next visit ~ 1 months   . Hyperlipidemia   . Hypertension   . Insomnia 02/11/2016  . Lateral epicondylitis of right elbow 03/17/2017  . Left foot pain 10/12/2013  . Low back pain 12/21/2012  . Malnutrition of moderate degree 05/18/2016  . Neck pain, bilateral posterior 10/15/2014  . Neuropathy 03/16/2015  . Pain of right upper extremity 05/30/2016  . Pancreatic pseudocyst    seen on CT scan  07/2014  . Pancreatitis 08/04/2014  . Permanent atrial fibrillation ([place]) 05/05/2017  . Pneumonia    "?a few times" (05/17/2016)  . Tobacco abuse 05/30/2011   Heavy smoker up to 3 ppd down to 15 cigs per day in 09/2014   . Type II diabetes mellitus [facility])     Patient Active Problem List   Diagnosis Date Noted  . Encounter for screening for malignant neoplasm of respiratory organs 03/20/2018  . Constipation   . Pancreatic pseudocyst 03/11/2018  . Blood in stool 02/19/2018  . Loss of weight 02/19/2018  . Declined smoking cessation 02/19/2018  . Acute on chronic pancreatitis ([place]) 10/31/2017  . Solitary pulmonary nodule 05/12/2017  . Pancreatitis, alcoholic, acute 34/35/6861  . Chronic atrial fibrillation ([place])   . On continuous oral anticoagulation 06/19/2015  . Chronic left-sided low back pain with sciatica 12/21/2012  . COPD (chronic obstructive pulmonary disease) ([place]) 04/27/2012  . GERD (gastroesophageal reflux disease) 03/05/2012  . Alcohol use disorder, mild, in early remission, abuse 06/09/2011  . Diabetes mellitus ([place]) 05/30/2011  . Chronic alcoholic pancreatitis ([place]) 05/30/2011  . Hyperlipidemia 05/30/2011  . Tobacco abuse 05/30/2011    Past Surgical History:  Procedure Laterality Date  . BACK SURGERY    . CARDIOVERSION N/A 09/22/2014   Procedure: CARDIOVERSION;  Surgeon: Thayer Headings, MD;  Location: [facility] ENDOSCOPY;  Service: Cardiovascular;  Laterality: N/A;  . NECK SURGERY  1974   not cervical, states had lesion on neck which was removed  . POSTERIOR LAMINECTOMY / DECOMPRESSION LUMBAR SPINE  10/2006   of L4, L5 and S1 with a 5-1 diskectomy, microdissection with the microscope/notes 10/24/2006  . SPLENECTOMY  2003        Home Medications    Prior to Admission medications   Medication Sig Start Date End Date Taking? Authorizing Provider  albuterol (PROVENTIL HFA;VENTOLIN HFA) 108 (90 Base) MCG/ACT inhaler Inhale 1-2 puffs into the lungs every 6 (six) hours as  needed for wheezing or shortness of breath. 11/14/17   [name], DO  amitriptyline (ELAVIL) 10 MG tablet TAKE 1 TABLET BY MOUTH EVERYDAY AT BEDTIME 03/14/18   Lockamy, Timothy, DO  atorvastatin (LIPITOR) 40 MG tablet Take 1 tablet (40 mg total) by mouth daily at 6 PM. 02/19/18   Lockamy, Timothy, DO  Blood Glucose Monitoring Suppl (ONE TOUCH ULTRA 2) W/DEVICE KIT Dispense one glucometer. Diagnosis Type 2 Diabetes. E11.9 09/12/14   Willeen Niece, MD  cyanocobalamin 500 MCG tablet Take 500 mcg by mouth daily.    [provider]  gabapentin (NEURONTIN) 100 MG capsule TAKE 1 CAPSULE BY MOUTH THREE TIMES A DAY 03/14/18   Lockamy, Timothy, DO  glucose blood (ONE TOUCH ULTRA TEST) test strip Use check fasting blood glucose qAM. Type 2 Diabetes E11.9 09/12/14   Willeen Niece, MD  metFORMIN (GLUCOPHAGE) 1000 MG tablet Take 1 tablet (1,000 mg total) by mouth 2 (two) times daily with a meal. 02/19/18   Lockamy, Timothy, DO  metoprolol tartrate (LOPRESSOR) 50 MG tablet Take 1.5 tablets (75 mg total) by mouth 2 (two) times daily. 02/19/18   [name], DO  mometasone-formoterol (DULERA) 200-5 MCG/ACT AERO Inhale 2 puffs into the lungs 2 (two) times daily. 06/13/17   Eloise Levels, MD  nicotine (NICODERM CQ - DOSED IN MG/24 HR) 7 mg/24hr patch Place 1 patch (7 mg total) onto the skin daily. Patient taking differently: Place 7 mg onto the skin daily as needed (nicotine withdrawl).  11/02/17   Rai, Vernelle Emerald, MD  ondansetron (ZOFRAN) 4 MG tablet Take 1 tablet (4 mg total) by mouth every 8 (eight) hours as needed for nausea or vomiting. 03/20/18   [name], DO  ONE TOUCH LANCETS MISC Check fasting blood glucose qAM. Type 2 DM. E11.9 09/12/14   Willeen Niece, MD  oxycodone (OXY-IR) 5 MG capsule Take 1 capsule (5 mg total) by mouth every 4 (four) hours as needed for up to 10 days. 03/20/18 03/30/18  [name], DO  pantoprazole (PROTONIX) 40 MG tablet Take 1 tablet (40 mg total) by mouth  daily. 03/18/18 05/17/18  Lovenia Kim, MD  polyethylene glycol (MIRALAX / GLYCOLAX) packet Take 17 g by mouth daily. 03/18/18   Lovenia Kim, MD  rivaroxaban (XARELTO) 20 MG TABS tablet Take 1 tablet (20 mg total) by mouth daily with supper. 02/19/18   [name], DO    Family History Family History  Problem Relation Age of Onset  . Diabetes Mother   . Aneurysm Mother   . Stroke Mother   . Hypertension Mother   . Alcohol abuse Father   . Cancer Father        Lung  . Cancer Sister        Lung Cancer  . Post-traumatic stress disorder Brother   . Heart attack Neg Hx     Social History Social History   Tobacco Use  . Smoking status: Current  Every Day Smoker    Packs/day: 1.00    Years: 52.00    Pack years: 52.00    Types: Cigarettes    Start date: 04/09/1964  . Smokeless tobacco: Never Used  . Tobacco comment: down from 2.5 ppd  Substance Use Topics  . Alcohol use: Yes    Comment: drinking 6 beers per week  . Drug use: No     Allergies   Prozac [fluoxetine hcl]; Morphine and related; and Penicillins   Review of Systems Review of Systems  Gastrointestinal: Positive for abdominal pain.  All other systems reviewed and are negative.    Physical Exam Updated Vital Signs BP 101/71 (BP Location: Right Arm)   Pulse 90   Temp 97.9 F (36.6 C) (Oral)   Resp 16   SpO2 98%   Physical Exam  Constitutional: He is oriented to person, place, and time.  Uncomfortable, vomiting   HENT:  Head: Normocephalic.  MM dry   Eyes: Pupils are equal, round, and reactive to light. EOM are normal.  Cardiovascular: Normal rate, regular rhythm and normal heart sounds.  Pulmonary/Chest: Effort normal and breath sounds normal.  Abdominal: Bowel sounds are normal.  + epigastric tenderness   Neurological: He is alert and oriented to person, place, and time.  Skin: Skin is warm. Capillary refill takes less than 2 seconds.  Psychiatric: He has a normal mood and affect. His  behavior is normal.  Nursing note and vitals reviewed.    ED Treatments / Results  Labs (all labs ordered are listed, but only abnormal results are displayed) Labs Reviewed  LIPASE, BLOOD - Abnormal; Notable for the following components:      Result Value   Lipase 180 (*)    All other components within normal limits  COMPREHENSIVE METABOLIC PANEL - Abnormal; Notable for the following components:   Chloride 97 (*)    Glucose, Bld 138 (*)    BUN 26 (*)    AST 96 (*)    ALT 133 (*)    Alkaline Phosphatase 218 (*)    All other components within normal limits  CBC - Abnormal; Notable for the following components:   WBC 10.6 (*)    All other components within normal limits  URINALYSIS, ROUTINE W REFLEX MICROSCOPIC  I-STAT TROPONIN, ED    EKG EKG Interpretation  Date/Time:  Wednesday March 21 2018 21:12:56 EDT Ventricular Rate:  86 PR Interval:    QRS Duration: 102 QT Interval:  370 QTC Calculation: 443 R Axis:   80 Text Interpretation:  Sinus rhythm Short PR interval Artifact in lead(s) I II III aVR aVL V1 V2 No significant change since last tracing Confirmed by Wandra Arthurs [phone]) on 03/21/2018 9:24:28 PM   Radiology No results found.  Procedures Procedures (including critical care time)   CRITICAL CARE Performed by: Wandra Arthurs   Total critical care time: 30 minutes  Critical care time was exclusive of separately billable procedures and treating other patients.  Critical care was necessary to treat or prevent imminent or life-threatening deterioration.  Critical care was time spent personally by me on the following activities: development of treatment plan with patient and/or surrogate as well as nursing, discussions with consultants, evaluation of patient's response to treatment, examination of patient, obtaining history from patient or surrogate, ordering and performing treatments and interventions, ordering and review of laboratory studies, ordering and  review of radiographic studies, pulse oximetry and re-evaluation of patient's condition.  Medications Ordered in ED Medications  sodium chloride 0.9 % bolus 1,000 mL (1,000 mLs Intravenous New Bag/Given 03/21/18 2241)  diltiazem (CARDIZEM) injection 10 mg (has no administration in time range)  sodium chloride 0.9 % bolus 1,000 mL (0 mLs Intravenous Stopped 03/21/18 2238)  ondansetron (ZOFRAN) injection 4 mg (4 mg Intravenous Given 03/21/18 2137)  HYDROmorphone (DILAUDID) injection 1 mg (1 mg Intravenous Given 03/21/18 2137)  HYDROmorphone (DILAUDID) injection 1 mg (1 mg Intravenous Given 03/21/18 2241)     Initial Impression / Assessment and Plan / ED Course  I have reviewed the triage vital signs and the nursing notes.  Pertinent labs & imaging results that were available during my care of the patient were reviewed by me and considered in my medical decision making (see chart for details).    Timothy Martinez is a 67 y.o. male hx of pancreatitis here with epigastric pain, RUQ pain. Recently admitted for pancreatic pseudocyst with some obstruction. Will repeat labs, lipase. Will get RUQ [place] to further assess the pseudocyst and to look for CBD dilation.   11 pm Patient developed rapid afib. Has hx of afib on xarelto. HR went up to 150s. Ordered cardizem bolus. Also lipase down to 180. LFTs stable. [place] pending.   11:30 PM HR still 120s despite cardizem bolus. Ordered cardizem drip IV. Family practice to readmit for rapid afib, pancreatitis.   Final Clinical Impressions(s) / ED Diagnoses   Final diagnoses:  None    ED Discharge Orders    None       Drenda Freeze, MD 03/21/18 [phone]

## 2018-03-21 NOTE — ED Triage Notes (Signed)
Pt arrives from home via [facility] c/o LUQ pain radiating to RUQ, nausea with emesis controlled by zofran, denies diarrhea.  Pt reports being discharged from [facility] on Sunday with "pancreatic obstruction."

## 2018-03-21 NOTE — H&P (Addendum)
[facility] Admission History and Physical Service Pager: [phone]  Patient name: Timothy Martinez Medical record number: [id number] Date of birth: 03-Mar-1951 Age: 67 y.o. Gender: male  Primary Care Provider: [name], DO Consultants: None  Code Status: Full  Chief Complaint: Abdominal pain   Assessment and Plan: [name] is a 67 y.o. male presenting with increased abdominal pain. PMH is significant for paroxysmal a fib, gerd, T2DM, COPD, HLD, chronic back pain, tobacco use, and previous alcohol use.   Atrial Fib with RVR, in setting of Paroxysmal Atrial Fibrillation: Likely secondary to patient forgetting to take his home metoprolol this am and potentially further precipitated in the setting of increased abdominal pain. Patient also endorses burning chest pain, similar to previous heartburn, however given this additional acute onset of his atrial fibrillation with RVR, must consider ACS as a potential cause. Reassured by troponin negative x1 and EKG without ischemic changes, however could be confounded by rapid heart rate. HR 140-150's, otherwise hemodynamically stable. EKG showing atrial fibrillation. Currently on diltiazem 5 mg/hr drip in the ED and will continue on this overnight with transition to his home metoprolol in the am if stable. Last Echo in 12/2014 showing preserved EF 55-60%, therefore comfortable with continuing on diltiazem for the time being. Takes metoprolol 79m BID at home for rate control.  -Admit to telemetry; Attending Dr. McDiarmid  -Monitor telemetry  -Trend troponin  -EKG in the am  -Cont diltiazem drip, titrate as necessary for rate control <120  -Cont home Xarelto 266m  -Transition to home metoprolol in the am   Abdominal Pain: Epigastric and upper abdominal pain with associated nausea most consistent with his chronic pancreatitis and obstructing pancreatitic pseudocyst, as this is similar to his previous "pancreatitis  pain" episodes. Consider superimposed acute pancreatitis, however lipase 180 at his baseline. Must also consider further obstruction of his 3.1cm pseudocyst (noted on 8/18 CT abdomen) on the duodenum, given his Alk phos is elevated to 218, and AST/ALT 96 and 133, previously 151, 55, and 106 respectively on 8/25. RUQ ultrasound currently pending for further evaluation. Note a mild leukocytosis of 10.6, however this is likely from inflammation rather than infectious process. Low suspicion for acute cholecystitis, as patient is afebrile and no evidence of biliary stones or gallbladder abnormalities on RUQ ultrasound 8/18.   -F/u RUQ ultrasound  -S/p Dilaudid 9m60mzofran, 2L NS in the ED  -Pain control: tylenol and oxycodone 5mg47m hours PRN  -Protonix 40mg79mly  -Zofran PRN for nausea  -FYI GI in the am, given increase in his symptoms from previous admission -Monitor CBC and CMP in am   GERD: Currently endorsing burning-like sensation in his chest, similar to previous episodes of heartburn, as noted above.  -Trend troponin -EKG in the am  -Protonix 40mg 17my    Recent Weight loss: Pt endorses recent 20+ lb weight loss with night sweats in the past approximate 6 months, despite normal diet and activity. Likely related to his chronic pancreatitis not currently on enzyme replacement therapy and pancreatitic pseudocyst, however given his history of FOBT positive stools and smoking, must consider GI vs lung malignancy. Pt is already scheduled to follow up with GI 9/20 and has low dose CT chest scan scheduled for 9/5.  -Monitor weight   Type 2 Diabetes: A1c 6.1 in 01/2018. Glucose 138 on admission. Takes metformin 1000mg B18mt home.  -sSSI  -Monitor glucose q4  COPD: Stable on RA, uses Dulera BID and albuterol  at home. He currently denies any dyspnea.   -Cont home dulera BID  -Albuterol as needed for SOB   Hyperlipidemia: Lipid panel in 10/2017 wnl. Takes Atorvastatin 40 mg daily at home.  -Hold  home atorvastatin in setting of transaniminitis   Chronic lower back pain with sciatica: Stable, not currently endorsing pain.  -Oxycodone 46m q4 for abdominal pain as above (continued from prior to admission) -Kpad   Alcohol use: Reports former h/o drinking beer, never any hard liquor. Last reported drink was a few months ago.  -monitor on CIWA  -Thiamine, folate, and multivitamin   Tobacco Use: Smokes 1-2 PPD, does not want a nicotine patch at this time.  History of solitary pulmonary nodule: Small stable 6745mnodule in the LL base last noted in 10/2017.    -No intervention required    FEN/GI: Soft diet, IV in place  Prophylaxis: Home Xarelto   Disposition: Admit to telemetry   History of Present Illness:  MiLATRAVION GRAVESs a [age].o. male, with a past history significant for chronic pancreatitis with obstructing pancreatitic pseudocyst, presenting with increased abdominal pain and nausea and then found to be in afib with RVR requiring admission. He was recently discharged on 8/25 for acute on chronic pancreatitis and was without concerns until today, with the exception of one episode of emesis on Monday. Around 6pm tonight, he developed increased abdominal pain and nausea not relieved by his oxycodone. He states the non-radiating epigastric and LUQ abdominal pain is burning and "needle stick" like in nature, similar to his previous pancreatitis pain. He has had no further episodes of emesis since Monday, last ate gravy biscuits this morning. However, continues to have nausea. He had been able to eat soft foods without concern prior to this, and feels that he could still manage this diet. Denies any fever, chills, rhinorrhea, cough, change in bowel movements, dysuria, hematochezia, or melena. He does also note with this nausea/abdominal pain, he forgot to take his metoprolol this morning. Feels like his heart is fluttering in his chest currently. He is not having dyspnea or chest pain,  though his epigastric pain is described as "heart burn."  On arrival, he was hemodynamically stable and in no acute distress. He received 45m98milaudid for pain control, zofran, and 2L NS in the ED. During his care, he developed atrial fib with RVR (with EKG showing atrial fib) and was subsequently started on a diltiazem drip. CBC and CMP notable for Alk phos 218, AST/ALT 96/133, bilirubin 1.2, lipase 180, WBC 10.6, and hgb 16.1. Troponin negative x1. RUQ U/s ordered and pending.    Review Of Systems: Per HPI with the following additions:   Review of Systems  Constitutional: Negative for chills and fever.  Respiratory: Negative for cough, sputum production and shortness of breath.   Cardiovascular: Positive for palpitations. Negative for orthopnea, claudication and leg swelling.  Gastrointestinal: Positive for abdominal pain, heartburn, nausea and vomiting. Negative for blood in stool, constipation, diarrhea and melena.  Genitourinary: Negative for dysuria, frequency and urgency.  Neurological: Negative for dizziness and headaches.    Patient Active Problem List   Diagnosis Date Noted  . Encounter for screening for malignant neoplasm of respiratory organs 03/20/2018  . Constipation   . Pancreatic pseudocyst 03/11/2018  . Blood in stool 02/19/2018  . Loss of weight 02/19/2018  . Declined smoking cessation 02/19/2018  . Acute on chronic pancreatitis ([place]/03/2018  . Solitary pulmonary nodule 05/12/2017  . Pancreatitis, alcoholic, acute 05/23/12/1438  Chronic atrial fibrillation ([place])   . On continuous oral anticoagulation 06/19/2015  . Chronic left-sided low back pain with sciatica 12/21/2012  . COPD (chronic obstructive pulmonary disease) ([place]) 04/27/2012  . GERD (gastroesophageal reflux disease) 03/05/2012  . Alcohol use disorder, mild, in early remission, abuse 06/09/2011  . Diabetes mellitus ([place]) 05/30/2011  . Chronic alcoholic pancreatitis ([place]) 05/30/2011  . Hyperlipidemia  05/30/2011  . Tobacco abuse 05/30/2011    Past Medical History: Past Medical History:  Diagnosis Date  . Acute on chronic pancreatitis ([place]) 05/17/2016  . Alcohol abuse   . Alcoholic pancreatitis   . Anginal pain ([place])   . Anxiety and depression 01/20/2015   PHQ 9 = 11 (01/20/15)    . Arthritis    "eat up w/it" (05/17/2016)  . Atrial fibrillation status post cardioversion ([place]) 08/2014  . Atrial flutter, unspecified   . CHF (congestive heart failure) ([place])   . Chronic anticoagulation   . Chronic bronchitis ([place])   . COLD (chronic obstructive lung disease) ([place])   . Daily headache    "need eye examined" (05/17/2016)  . GERD (gastroesophageal reflux disease)   . Hematuria, microscopic 02/13/2015   Noted on UA - Recheck UA at next visit ~ 1 months   . Hyperlipidemia   . Hypertension   . Insomnia 02/11/2016  . Lateral epicondylitis of right elbow 03/17/2017  . Left foot pain 10/12/2013  . Low back pain 12/21/2012  . Malnutrition of moderate degree 05/18/2016  . Neck pain, bilateral posterior 10/15/2014  . Neuropathy 03/16/2015  . Pain of right upper extremity 05/30/2016  . Pancreatic pseudocyst    seen on CT scan 07/2014  . Pancreatitis 08/04/2014  . Permanent atrial fibrillation ([place]) 05/05/2017  . Pneumonia    "?a few times" (05/17/2016)  . Tobacco abuse 05/30/2011   Heavy smoker up to 3 ppd down to 15 cigs per day in 09/2014   . Type II diabetes mellitus ([place])     Past Surgical History: Past Surgical History:  Procedure Laterality Date  . BACK SURGERY    . CARDIOVERSION N/A 09/22/2014   Procedure: CARDIOVERSION;  Surgeon: Thayer Headings, MD;  Location: [facility] ENDOSCOPY;  Service: Cardiovascular;  Laterality: N/A;  . NECK SURGERY  1974   not cervical, states had lesion on neck which was removed  . POSTERIOR LAMINECTOMY / DECOMPRESSION LUMBAR SPINE  10/2006   of L4, L5 and S1 with a 5-1 diskectomy, microdissection with the microscope/notes 10/24/2006  . SPLENECTOMY  2003    Social  History: Social History   Tobacco Use  . Smoking status: Current Every Day Smoker    Packs/day: 1.00    Years: 52.00    Pack years: 52.00    Types: Cigarettes    Start date: 04/09/1964  . Smokeless tobacco: Never Used  . Tobacco comment: down from 2.5 ppd  Substance Use Topics  . Alcohol use: Yes    Comment: drinking 6 beers per week  . Drug use: No   Additional social history: 1-2PPD smoking Please also refer to relevant sections of EMR.  Family History: Family History  Problem Relation Age of Onset  . Diabetes Mother   . Aneurysm Mother   . Stroke Mother   . Hypertension Mother   . Alcohol abuse Father   . Cancer Father        Lung  . Cancer Sister        Lung Cancer  . Post-traumatic stress disorder Brother   . Heart attack  Neg Hx     Allergies and Medications: Allergies  Allergen Reactions  . Prozac [Fluoxetine Hcl] Nausea Only    Made the patient very sick to his stomach  . Morphine And Related Swelling    Pt states he can tolerate oxycodone and hydromorphone  . Penicillins Rash    Has patient had a PCN reaction causing immediate rash, facial/tongue/throat swelling, SOB or lightheadedness with hypotension: Yes Has patient had a PCN reaction causing severe rash involving mucus membranes or skin necrosis: No Has patient had a PCN reaction that required hospitalization: No Has patient had a PCN reaction occurring within the last 10 years: No If all of the above answers are "NO", then may proceed with Cephalosporin use.    No current facility-administered medications on file prior to encounter.    Current Outpatient Medications on File Prior to Encounter  Medication Sig Dispense Refill  . albuterol (PROVENTIL HFA;VENTOLIN HFA) 108 (90 Base) MCG/ACT inhaler Inhale 1-2 puffs into the lungs every 6 (six) hours as needed for wheezing or shortness of breath. 1 Inhaler 3  . amitriptyline (ELAVIL) 10 MG tablet TAKE 1 TABLET BY MOUTH EVERYDAY AT BEDTIME 90 tablet 2  .  atorvastatin (LIPITOR) 40 MG tablet Take 1 tablet (40 mg total) by mouth daily at 6 PM. 90 tablet 3  . Blood Glucose Monitoring Suppl (ONE TOUCH ULTRA 2) W/DEVICE KIT Dispense one glucometer. Diagnosis Type 2 Diabetes. E11.9 1 each 0  . cyanocobalamin 500 MCG tablet Take 500 mcg by mouth daily.    Marland Kitchen gabapentin (NEURONTIN) 100 MG capsule TAKE 1 CAPSULE BY MOUTH THREE TIMES A DAY 270 capsule 2  . glucose blood (ONE TOUCH ULTRA TEST) test strip Use check fasting blood glucose qAM. Type 2 Diabetes E11.9 100 each 3  . metFORMIN (GLUCOPHAGE) 1000 MG tablet Take 1 tablet (1,000 mg total) by mouth 2 (two) times daily with a meal. 180 tablet 3  . metoprolol tartrate (LOPRESSOR) 50 MG tablet Take 1.5 tablets (75 mg total) by mouth 2 (two) times daily. 270 tablet 3  . mometasone-formoterol (DULERA) 200-5 MCG/ACT AERO Inhale 2 puffs into the lungs 2 (two) times daily. 1 Inhaler 3  . nicotine (NICODERM CQ - DOSED IN MG/24 HR) 7 mg/24hr patch Place 1 patch (7 mg total) onto the skin daily. (Patient taking differently: Place 7 mg onto the skin daily as needed (nicotine withdrawl). ) 28 patch 0  . ondansetron (ZOFRAN) 4 MG tablet Take 1 tablet (4 mg total) by mouth every 8 (eight) hours as needed for nausea or vomiting. 30 tablet 0  . ONE TOUCH LANCETS MISC Check fasting blood glucose qAM. Type 2 DM. E11.9 200 each 1  . oxycodone (OXY-IR) 5 MG capsule Take 1 capsule (5 mg total) by mouth every 4 (four) hours as needed for up to 10 days. 10 capsule 0  . pantoprazole (PROTONIX) 40 MG tablet Take 1 tablet (40 mg total) by mouth daily. 30 tablet 1  . polyethylene glycol (MIRALAX / GLYCOLAX) packet Take 17 g by mouth daily. 14 each 0  . rivaroxaban (XARELTO) 20 MG TABS tablet Take 1 tablet (20 mg total) by mouth daily with supper. 30 tablet 5    Objective: BP (!) 106/58   Pulse (!) 127   Temp 97.9 F (36.6 C) (Oral)   Resp 17   SpO2 95%  Exam: General: Alert, NAD HEENT: NCAT, MM dry, oropharynx nonerythematous   Cardiac: Irregular rate and rhythm, tachycardic, no m/g/r Lungs: Clear bilaterally, no  increased WOB  Abdomen: soft, normoactive BS x4. TTP of LUQ/epigastrium/RUQ without rebounding or guarding.  Msk: Moves all extremities spontaneously  Ext: Warm, dry, 2+ distal pulses, no edema  Neuro: Alert and oriented x3, pupils equal and reactive, no focal neuro deficits noted  Derm:No rashes or bruising  Psych: appropriate affect  Labs and Imaging: CBC BMET  Recent Labs  Lab 03/21/18 2109  WBC 10.6*  HGB 16.1  HCT 49.5  PLT 316   Recent Labs  Lab 03/21/18 2109  NA 141  K 3.5  CL 97*  CO2 32  BUN 26*  CREATININE 1.19  GLUCOSE 138*  CALCIUM 9.9       Patriciaann Clan, DO 03/21/2018, 11:14 PM PGY-1, [facility] Intern pager: [phone], text pages welcome  I have separately seen and examined the patient. I have discussed the findings and exam with Dr. Higinio Plan and agree with the above note.  My changes/additions are outlined in BLUE.   Everrett Coombe, MD PGY-3 [facility]

## 2018-03-22 ENCOUNTER — Other Ambulatory Visit: Payer: Self-pay

## 2018-03-22 ENCOUNTER — Encounter (HOSPITAL_COMMUNITY): Payer: Self-pay | Admitting: Family Medicine

## 2018-03-22 ENCOUNTER — Telehealth: Payer: Self-pay | Admitting: Cardiovascular Disease

## 2018-03-22 DIAGNOSIS — K860 Alcohol-induced chronic pancreatitis: Secondary | ICD-10-CM | POA: Diagnosis not present

## 2018-03-22 DIAGNOSIS — Q8901 Asplenia (congenital): Secondary | ICD-10-CM

## 2018-03-22 DIAGNOSIS — I4891 Unspecified atrial fibrillation: Secondary | ICD-10-CM | POA: Diagnosis not present

## 2018-03-22 DIAGNOSIS — I480 Paroxysmal atrial fibrillation: Secondary | ICD-10-CM | POA: Diagnosis not present

## 2018-03-22 DIAGNOSIS — K5669 Other partial intestinal obstruction: Secondary | ICD-10-CM | POA: Diagnosis not present

## 2018-03-22 DIAGNOSIS — R9431 Abnormal electrocardiogram [ECG] [EKG]: Secondary | ICD-10-CM

## 2018-03-22 DIAGNOSIS — K863 Pseudocyst of pancreas: Secondary | ICD-10-CM | POA: Diagnosis not present

## 2018-03-22 DIAGNOSIS — Z8249 Family history of ischemic heart disease and other diseases of the circulatory system: Secondary | ICD-10-CM | POA: Diagnosis not present

## 2018-03-22 DIAGNOSIS — Z833 Family history of diabetes mellitus: Secondary | ICD-10-CM | POA: Diagnosis not present

## 2018-03-22 DIAGNOSIS — E43 Unspecified severe protein-calorie malnutrition: Secondary | ICD-10-CM

## 2018-03-22 HISTORY — DX: Asplenia (congenital): Q89.01

## 2018-03-22 LAB — CBC
HCT: 39.4 % (ref 39.0–52.0)
Hemoglobin: 12.7 g/dL — ABNORMAL LOW (ref 13.0–17.0)
MCH: 30.5 pg (ref 26.0–34.0)
MCHC: 32.2 g/dL (ref 30.0–36.0)
MCV: 94.5 fL (ref 78.0–100.0)
Platelets: 263 10*3/uL (ref 150–400)
RBC: 4.17 MIL/uL — ABNORMAL LOW (ref 4.22–5.81)
RDW: 14.1 % (ref 11.5–15.5)
WBC: 10.6 10*3/uL — ABNORMAL HIGH (ref 4.0–10.5)

## 2018-03-22 LAB — COMPREHENSIVE METABOLIC PANEL
ALT: 116 U/L — ABNORMAL HIGH (ref 0–44)
AST: 102 U/L — ABNORMAL HIGH (ref 15–41)
Albumin: 2.9 g/dL — ABNORMAL LOW (ref 3.5–5.0)
Alkaline Phosphatase: 169 U/L — ABNORMAL HIGH (ref 38–126)
Anion gap: 14 (ref 5–15)
BUN: 20 mg/dL (ref 8–23)
CO2: 24 mmol/L (ref 22–32)
Calcium: 8.4 mg/dL — ABNORMAL LOW (ref 8.9–10.3)
Chloride: 106 mmol/L (ref 98–111)
Creatinine, Ser: 1 mg/dL (ref 0.61–1.24)
GFR calc Af Amer: >60 mL/min (ref >60)
GFR calc non Af Amer: >60 mL/min (ref >60)
Glucose, Bld: 115 mg/dL — ABNORMAL HIGH (ref 70–99)
Potassium: 3.3 mmol/L — ABNORMAL LOW (ref 3.5–5.1)
Sodium: 144 mmol/L (ref 135–145)
Total Bilirubin: 1.3 mg/dL — ABNORMAL HIGH (ref 0.3–1.2)
Total Protein: 6.1 g/dL — ABNORMAL LOW (ref 6.5–8.1)

## 2018-03-22 LAB — TROPONIN I
Troponin I: <0.03 ng/mL (ref <0.03)
Troponin I: <0.03 ng/mL (ref <0.03)
Troponin I: <0.03 ng/mL (ref <0.03)

## 2018-03-22 LAB — GLUCOSE, CAPILLARY
Glucose-Capillary: 92 mg/dL (ref 70–99)
Glucose-Capillary: 91 mg/dL (ref 70–99)
Glucose-Capillary: 100 mg/dL — ABNORMAL HIGH (ref 70–99)
Glucose-Capillary: 126 mg/dL — ABNORMAL HIGH (ref 70–99)
Glucose-Capillary: 108 mg/dL — ABNORMAL HIGH (ref 70–99)

## 2018-03-22 LAB — PHOSPHORUS: Phosphorus: 3.8 mg/dL (ref 2.5–4.6)

## 2018-03-22 LAB — TSH: TSH: 2.634 u[IU]/mL (ref 0.350–4.500)

## 2018-03-22 LAB — MAGNESIUM: Magnesium: 1.6 mg/dL — ABNORMAL LOW (ref 1.7–2.4)

## 2018-03-22 MED ORDER — ALUM & MAG HYDROXIDE-SIMETH 200-200-20 MG/5ML PO SUSP
15.0000 mL | Freq: Four times a day (QID) | ORAL | Status: DC | PRN
  Administered 2018-03-22: 15 mL via ORAL
  Filled 2018-03-22: qty 30

## 2018-03-22 MED ORDER — VITAMIN B-1 100 MG PO TABS
100.0000 mg | ORAL_TABLET | Freq: Every day | ORAL | Status: DC
  Administered 2018-03-22 – 2018-03-23 (×2): 100 mg via ORAL
  Filled 2018-03-22 (×3): qty 1

## 2018-03-22 MED ORDER — THIAMINE HCL 100 MG/ML IJ SOLN
100.0000 mg | Freq: Every day | INTRAMUSCULAR | Status: DC
  Filled 2018-03-22 (×2): qty 2

## 2018-03-22 MED ORDER — MAGNESIUM SULFATE 2 GM/50ML IV SOLN
2.0000 g | Freq: Once | INTRAVENOUS | Status: AC
  Administered 2018-03-22: 2 g via INTRAVENOUS
  Filled 2018-03-22: qty 50

## 2018-03-22 MED ORDER — BOOST / RESOURCE BREEZE PO LIQD CUSTOM: 1.0000 | Freq: Three times a day (TID) | ORAL | Status: DC

## 2018-03-22 MED ORDER — POTASSIUM CHLORIDE CRYS ER 20 MEQ PO TBCR
40.0000 meq | EXTENDED_RELEASE_TABLET | Freq: Once | ORAL | Status: AC
  Administered 2018-03-22: 40 meq via ORAL
  Filled 2018-03-22: qty 2

## 2018-03-22 MED ORDER — SODIUM CHLORIDE 0.9 % IV BOLUS
1000.0000 mL | Freq: Once | INTRAVENOUS | Status: AC
  Administered 2018-03-22: 1000 mL via INTRAVENOUS

## 2018-03-22 MED ORDER — SODIUM CHLORIDE 0.9 % IV SOLN
INTRAVENOUS | Status: DC | PRN
  Administered 2018-03-22: 10:00:00 via INTRAVENOUS

## 2018-03-22 MED ORDER — GI COCKTAIL ~~LOC~~
30.0000 mL | Freq: Two times a day (BID) | ORAL | Status: DC | PRN
  Filled 2018-03-22: qty 30

## 2018-03-22 MED ORDER — FOLIC ACID 1 MG PO TABS
1.0000 mg | ORAL_TABLET | Freq: Every day | ORAL | Status: DC
  Administered 2018-03-22 – 2018-03-23 (×2): 1 mg via ORAL
  Filled 2018-03-22 (×3): qty 1

## 2018-03-22 MED ORDER — ADULT MULTIVITAMIN W/MINERALS CH
1.0000 | ORAL_TABLET | Freq: Every day | ORAL | Status: DC
  Administered 2018-03-22 – 2018-03-23 (×2): 1 via ORAL
  Filled 2018-03-22 (×3): qty 1

## 2018-03-22 MED ORDER — ENSURE ENLIVE PO LIQD: 237.0000 mL | Freq: Two times a day (BID) | ORAL | Status: DC

## 2018-03-22 MED ORDER — ACETAMINOPHEN 325 MG PO TABS: 650.0000 mg | ORAL_TABLET | Freq: Once | ORAL | Status: DC

## 2018-03-22 MED ORDER — METOPROLOL TARTRATE 50 MG PO TABS
75.0000 mg | ORAL_TABLET | Freq: Two times a day (BID) | ORAL | Status: DC
  Administered 2018-03-22 – 2018-03-23 (×3): 75 mg via ORAL
  Filled 2018-03-22 (×3): qty 1

## 2018-03-22 NOTE — Plan of Care (Signed)

## 2018-03-22 NOTE — Progress Notes (Signed)
Patient ID: Timothy Martinez, male   DOB: 24-Nov-1950, 67 y.o.   MRN: [id number]  S: Called by nursing for lower blood pressures while on dilt drip, 97-02'O systolic. Pt asymptomatic and evaluated at bedside.   O: Blood pressure 92/68, pulse (!) 129, temperature 97.6 F (36.4 C), temperature source Oral, resp. rate 18, SpO2 93 %.  General: Alert, comfortable lying watching tv  Cardiac: Irregular rate and rhythm, tachycardic, no m/g/r Pulm: Clear bilaterally a/p, without increased work of breathing, on RA Neuro: A &O x3   A/P:  -Diltiazem drip was discontinued. -Started on NS fluid bolus, blood pressure responsive now 378'H systolic.  -Spoke with cardiology fellow, Dr. Paticia Stack, who stated if his blood pressure remains stable >885 systolic, it would be acceptable to restart his home metoprolol 75mg  BID. However, if not, can consider starting alternative control therapy, like amiodarone.   Patriciaann Clan, DO  PGY-1 Family Medicine

## 2018-03-22 NOTE — Progress Notes (Signed)
FPTS Interim Progress Note  Patient admitted earlier for afib with RVR in setting of missed metoprolol doses. Initially was on dilt gtt. Discontinued diltiazem because of hypotension and gave 1L fluid bolus. Hypotension improved, now 99/66. HR also improved to 70s-80s. We will hold off starting another rate control agent for now and let  BP stabilize. Nursing to page if patient goes back into RVR.  Appreciate excellent nursing care.  Everrett Coombe, MD 03/22/2018, 2:39 AM PGY-3, [place] Medicine Service pager [phone]

## 2018-03-22 NOTE — Progress Notes (Addendum)
Family Medicine Teaching Service Daily Progress Note Intern Pager: [phone]  Patient name: Timothy Martinez Medical record number: [id number] Date of birth: 10/26/1950 Age: 67 y.o. Gender: male  Primary Care Provider: Lockamy, Timothy, DO Consultants: None Code Status: Full  Pt Overview and Major Events to Date:  8/28 Admitted to FPTS  Assessment and Plan: Timothy Martinez is a 67 y.o. male presenting with increased abdominal pain. PMH is significant for paroxysmal a fib, gerd, T2DM, COPD, HLD, chronic back pain, tobacco use, and previous alcohol use.  Atrial Fib with RVR, in setting of ParoxysmalAtrial Fibrillation Likely 2/2 missed metoprolol dose and increased abdominal pain.  EKG on admission, A fib.  Overnight, repeat EKGs have shown A Fib with RVR and ST segment depression, most recently at 0130. Troponins negative x3.  Was on dilt gtt that was d/c'ed around 0200 in the setting of hypotension, given 1L fluid bolus.  Dr. Akhter stated it SBP >100, could restart home metoprolol 75mg BID, otherwise can consider amiodarone.  Patient not currently on rate control.  HR overnight improved from 120s-160 to 70s-80s this AM.  BP overnight has been stable with SBP in low 100s. Rate regular on exam. -restart home metoprolol 75mg BID today - cont home xarelto 20mg - cont to monitor on telemetry - f/u AM EKG - will give patient ASA and nitro to use prn at home for chest pain with instructions that if doesn't resolve after using either to call  Abdominal Pain Epigastric and upper abdominal pain with associated nausea most consistent with his chronic pancreatitis and obstructing pancreatitic pseudocyst, as this is similar to his previous "pancreatitis pain" episodes.  Lipase at baseline 180 on admission.  AST 102, ALT 116, Alk Phose 169, improved from admission labs.  WBC stable at 10.6, likely 2/2 inflammation. Abdominal U/S no sign of acute cholecystitis, distended stomach, splenectomy, 2.6cm abdominal  aortic ectasia.  Pain this AM 5/10, improved from 12/10 on admission.  Vomited once last PM, has not since. Very TTP of epigastric region. -Pain control: tylenol and oxycodone 5mg q4 hours PRN, will schedule tylenol and ask patient about his home use of oxy for outpatient plan -Protonix 40mg daily  -Zofran PRN for nausea  - will let GI know he is here with increased symptoms -cont to monitor CMP, CBC  GERD Burning chest pain on admission.  Trops negative.  Doubt cardiac.  Notes it is similar to heartburn he has experienced in the past.  This AM notes burning chest pain has resolved. - f/u AM EKG - protonix 40mg QD  Anemia Hgb 12.7 this AM, down from 16.1 on admission.  Patient received total of 3L fluid boluses yesterday.  Likely secondary to dilution.  VSS. - f/u CBC in AM  Hypokalemia K 3.3 this AM - 40mEq K-dur once today - f/u BMP in AM  Hypoalbuminemia Albumin 4.0 on admission, 2.9 this AM. Could be due to dilution. Likely also 2/2 poor nutrition. - will cont to monitor on daily BMP - recommend Ensure or 2x Carnation instant breakfast daily  Recent Weight loss 20+ lb weight loss with night sweats in the past approximate 6 months, despite normal diet and activity. Likely related to his chronic pancreatitis not currently on enzyme replacement therapy and pancreatitic pseudocyst, however given his history of FOBT positive stools and smoking, must consider GI vs lung malignancy. Pt is already scheduled to follow up with GI 9/20 and has low dose CT chest scan scheduled for 9/5. - cont to   monitor weight - follow up outpatient as scheduled  Type 2 Diabetes A1c 6.1 in 01/2018.Takes metformin 1000mg BID at home. - sSSI - CBG q4  COPD: Stable Dulera BID and albuterol at home.  HLD Lipid panel in 10/2017 wnl. Takes Atorvastatin 40 mg daily at home.   Chronic lower back pain with sciatica: Stable  No current pain.  Alcohol use Reports former h/o drinking beer, never any hard  liquor. Last reporteddrink was a few months ago. CIWAs overnight 1>2.  Tobacco Use Smokes 1-2 PPD.  Declines nicotine patch.  History of solitary pulmonary nodule Small stable 6mm nodule in the LL base last noted in 10/2017.  Scheduled for low dose CT chest scan scheduled on 9/5. -f/u outpatient as scheduled  FEN/GI: Soft PPx: Home Xarelto  Disposition: pending clinical improvement  Subjective:  Patient states burning chest pain has resolved.  Notes abdominal pain has improved to 5/10 this AM from 12/10 on admission.  No vomiting since last PM.  Denies shortness of breath, chest pain.  Objective: Temp:  [97.6 F (36.4 C)-97.9 F (36.6 C)] 97.6 F (36.4 C) (08/29 0405) Pulse Rate:  [70-160] 77 (08/29 0405) Resp:  [16-24] 18 (08/29 0107) BP: (78-130)/(47-83) 104/60 (08/29 0405) SpO2:  [91 %-99 %] 93 % (08/29 0405) Weight:  [69.9 kg] 69.9 kg (08/29 0515)   Physical Exam: General: 67 y.o. male in NAD Cardio: RRR no m/r/g Lungs: CTAB, no wheezing, no rhonchi, no crackles Abdomen: Soft, very TTP of epigastric region, positive bowel sounds Skin: warm and dry Extremities: No edema   Laboratory: Recent Labs  Lab 03/18/18 0659 03/21/18 2109 03/22/18 0608  WBC 9.7 10.6* 10.6*  HGB 14.0 16.1 12.7*  HCT 43.5 49.5 39.4  PLT 305 316 263   Recent Labs  Lab 03/18/18 0659 03/21/18 2109 03/22/18 0436  NA 140 141 144  K 4.0 3.5 3.3*  CL 103 97* 106  CO2 25 32 24  BUN 27* 26* 20  CREATININE 1.00 1.19 1.00  CALCIUM 9.1 9.9 8.4*  PROT 6.8 8.0 6.1*  BILITOT 1.4* 1.2 1.3*  ALKPHOS 151* 218* 169*  ALT 106* 133* 116*  AST 55* 96* 102*  GLUCOSE 100* 138* 115*      Imaging/Diagnostic Tests: Us Abdomen Complete  Result Date: 03/22/2018 CLINICAL DATA:  67-year-old male with abdominal pain. History of splenectomy. Diabetes, hypertension. EXAM: ABDOMEN ULTRASOUND COMPLETE COMPARISON:  CT of the abdomen pelvis dated 03/11/2018 and ultrasound dated 03/11/2018 FINDINGS:  Gallbladder: There is sludge within the gallbladder. No gallbladder wall thickening or pericholecystic fluid. Negative sonographic Murphy's sign. Common bile duct: Diameter: 6 mm Liver: The liver is unremarkable. Portal vein is patent on color Doppler imaging with normal direction of blood flow towards the liver. IVC: No abnormality visualized. Pancreas: Poorly visualized and obscured by bowel gas. Spleen: Splenectomy. Right Kidney: Length: 12.9 cm. Normal echogenicity. No hydronephrosis or shadowing stone. Left Kidney: Length: 13.9 cm. Normal echogenicity. No hydronephrosis or shadowing stone. Abdominal aorta: Ectasia of the visualized portion of the abdominal aorta measure up to 2.6 cm. Other findings: Distended stomach with oral content. IMPRESSION: 1. Gallbladder sludge. No sonographic evidence of acute cholecystitis. 2. Distended stomach with gastric content. 3. Splenectomy. 4. A 2.6 cm abdominal aortic ectasia. Electronically Signed   By: Arash  Radparvar M.D.   On: 03/22/2018 00:59    Meccariello, Bailey J, DO 03/22/2018, 6:39 AM PGY-1, [facility] FPTS Intern pager: [phone], text pages welcome  

## 2018-03-22 NOTE — Progress Notes (Signed)
Pt arrived the unit at about 1:30am with a Caredizem drip, Afib HR from 40's to 160's, and low BP. On call residents were paged and notified. BP kept dropping to the 80's/60's. Residents came to the bedside to assess the patient. Cardizem drip was ordered to be stopped. A bolus of 1000 cc was ordered and given. BP was restored to the 100's - 110's / 60's. HR went to the 70's and 80's and pt reverted back to NSR at around 4:30am. Resident on call was notified. Pt is currently resting well. Will continue to monitor

## 2018-03-22 NOTE — Telephone Encounter (Signed)
Attempted to call patient, no answer and no voice mail

## 2018-03-22 NOTE — Telephone Encounter (Signed)
New Message    Patient is calling to advise that he has been admitted to the hospital. Therefore he had to cancel his appointment for 8/30. He would like Dr. Acie Fredrickson to reach out to him if possible.

## 2018-03-22 NOTE — Progress Notes (Signed)
Initial Nutrition Assessment  DOCUMENTATION CODES:   Severe malnutrition in context of chronic illness  INTERVENTION:   -Continue MVI with minerals daily -Boost Breeze po TID, each supplement provides 250 kcal and 9 grams of protein -D/c Ensure Enlive po BID, each supplement provides 350 kcal and 20 grams of protein -Snacks TID with meals, per pt request  NUTRITION DIAGNOSIS:   Severe Malnutrition related to chronic illness(COPD) as evidenced by energy intake < 75% for > or equal to 1 month, moderate fat depletion, severe fat depletion, moderate muscle depletion, severe muscle depletion.  GOAL:   Patient will meet greater than or equal to 90% of their needs  MONITOR:   PO intake, Supplement acceptance, Labs, Weight trends, Skin, I & O's  REASON FOR ASSESSMENT:   Malnutrition Screening Tool    ASSESSMENT:   Timothy Martinez is a 67 y.o. male presenting with increased abdominal pain. PMH is significant for paroxysmal a fib, gerd, T2DM, COPD, HLD, chronic back pain, tobacco use, and previous alcohol use.   Pt admitted with a-fib with RVR and abdominal pain.   Spoke with pt at bedside, who reports decreased appetite over the past 2 weeks related to abdominal pain and nausea and vomiting. Per pt, he was only able to consume 2.5 banana sandwiches daily over the past 2 weeks because "everything else I ate I threw up". Typical intake is 2 meals per day, which generally consist of either 2 Big Macs and fries or two sandwiches.   Pt reports ongoing weight loss over the past 3-4 months ("I'm frustrated; I'm supposed to have a scan for cancer and I think that's why I've lost weight"). Pt shares that her UBW is around 170#, which he last weighed around 1 year ago. Pt estimates he last lost about 20# within the past 6 months, however, this is not consistent with weight hx. Reviewed wt hx, which reveals a 11.7% wt loss over the past year, which while not consistent for time frame, is concerning  when coupled with decreased intake and multiple illnesses and hospitalization. 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   Pt reports that he has been consuming 2 Ensures daily over the past month, but no longer tolerates them. He refuses to drink them again ("they make me sick"). Discussed importance of good meal and supplement intake to promote healing as well as prevent further weight loss.   Medications reviewed and include folvite, MVI, thimine, and vitamin B-12.   Last Hgb A1c: 6.9 (02/19/18). PTA DM medications are 1000 mg metformin BID.   Labs reviewed: K: 3.3, CBGS: 91-92 (inpatient orders for glycemic control are 0-9 units insulin aspart TID with meals).   NUTRITION - FOCUSED PHYSICAL EXAM:    Most Recent Value  Orbital Region  Severe depletion  Upper Arm Region  Severe depletion  Thoracic and Lumbar Region  Severe depletion  Buccal Region  Moderate depletion  Temple Region  Severe depletion  Clavicle Bone Region  Severe depletion  Clavicle and Acromion Bone Region  Severe depletion  Scapular Bone Region  Severe depletion  Dorsal Hand  Moderate depletion  Patellar Region  Severe depletion  Anterior Thigh Region  Severe depletion  Posterior Calf Region  Severe depletion  Edema (RD Assessment)  None  Hair  Reviewed  Eyes  Reviewed  Mouth  Reviewed  Skin  Reviewed  Nails  Reviewed       Diet Order:   Diet Order            DIET SOFT Room service appropriate? Yes; Fluid consistency: Thin  Diet effective now              EDUCATION NEEDS:   Education needs have been addressed  Skin:  Skin Assessment: Reviewed RN Assessment  Last BM:  PTA  Height:   Ht Readings from Last 1 Encounters:  03/22/18 6\' 3"  (1.905 m)    Weight:   Wt Readings from Last 1 Encounters:  03/22/18 69.9 kg    Ideal Body Weight:  89.1 kg  BMI:  Body mass  index is 19.26 kg/m.  Estimated Nutritional Needs:   Kcal:  2250-2450  Protein:  115-130 grams  Fluid:  >2.2 L    Timothy Martinez, RD, LDN, CDE Pager: [phone] After hours Pager: [phone]

## 2018-03-22 NOTE — Telephone Encounter (Signed)
Unable to reach pt letter mailed for pt or caregiver to call for further imaging

## 2018-03-23 ENCOUNTER — Ambulatory Visit: Payer: Medicare Other | Admitting: Cardiovascular Disease

## 2018-03-23 DIAGNOSIS — I4891 Unspecified atrial fibrillation: Secondary | ICD-10-CM | POA: Diagnosis not present

## 2018-03-23 DIAGNOSIS — K5669 Other partial intestinal obstruction: Secondary | ICD-10-CM | POA: Diagnosis not present

## 2018-03-23 DIAGNOSIS — R9431 Abnormal electrocardiogram [ECG] [EKG]: Secondary | ICD-10-CM | POA: Diagnosis not present

## 2018-03-23 DIAGNOSIS — K863 Pseudocyst of pancreas: Secondary | ICD-10-CM | POA: Diagnosis not present

## 2018-03-23 DIAGNOSIS — I480 Paroxysmal atrial fibrillation: Secondary | ICD-10-CM | POA: Diagnosis not present

## 2018-03-23 LAB — CBC
HCT: 42.9 % (ref 39.0–52.0)
Hemoglobin: 13.5 g/dL (ref 13.0–17.0)
MCH: 29.7 pg (ref 26.0–34.0)
MCHC: 31.5 g/dL (ref 30.0–36.0)
MCV: 94.5 fL (ref 78.0–100.0)
Platelets: 278 10*3/uL (ref 150–400)
RBC: 4.54 MIL/uL (ref 4.22–5.81)
RDW: 13.8 % (ref 11.5–15.5)
WBC: 10.2 10*3/uL (ref 4.0–10.5)

## 2018-03-23 LAB — BASIC METABOLIC PANEL WITH GFR
Anion gap: 9 (ref 5–15)
BUN: 11 mg/dL (ref 8–23)
CO2: 27 mmol/L (ref 22–32)
Calcium: 8.9 mg/dL (ref 8.9–10.3)
Chloride: 101 mmol/L (ref 98–111)
Creatinine, Ser: 0.88 mg/dL (ref 0.61–1.24)
GFR calc Af Amer: >60 mL/min
GFR calc non Af Amer: >60 mL/min
Glucose, Bld: 106 mg/dL — ABNORMAL HIGH (ref 70–99)
Potassium: 3.5 mmol/L (ref 3.5–5.1)
Sodium: 137 mmol/L (ref 135–145)

## 2018-03-23 LAB — MAGNESIUM: Magnesium: 1.4 mg/dL — ABNORMAL LOW (ref 1.7–2.4)

## 2018-03-23 LAB — GLUCOSE, CAPILLARY
Glucose-Capillary: 88 mg/dL (ref 70–99)
Glucose-Capillary: 130 mg/dL — ABNORMAL HIGH (ref 70–99)

## 2018-03-23 MED ORDER — ACETAMINOPHEN 325 MG PO TABS: 650.0000 mg | ORAL_TABLET | Freq: Four times a day (QID) | ORAL | Status: DC

## 2018-03-23 MED ORDER — MAGNESIUM SULFATE 2 GM/50ML IV SOLN
2.0000 g | Freq: Once | INTRAVENOUS | Status: AC
  Administered 2018-03-23: 2 g via INTRAVENOUS
  Filled 2018-03-23: qty 50

## 2018-03-23 MED ORDER — ACETAMINOPHEN 325 MG PO TABS
650.0000 mg | ORAL_TABLET | Freq: Four times a day (QID) | ORAL | Status: DC
  Filled 2018-03-23 (×2): qty 2

## 2018-03-23 MED ORDER — ASPIRIN EC 81 MG PO TBEC: 81.0000 mg | DELAYED_RELEASE_TABLET | ORAL | 2 refills | Status: DC | PRN

## 2018-03-23 MED ORDER — NITROGLYCERIN 0.4 MG SL SUBL: 0.4000 mg | SUBLINGUAL_TABLET | SUBLINGUAL | 3 refills | Status: AC | PRN

## 2018-03-23 NOTE — Discharge Instructions (Signed)
We are sending you home with a prescription for nitroglycerin and you should also get some baby aspirin over the counter.  Take one of these if you experience chest pain.  If does not resolve the chest pain, please call.

## 2018-03-23 NOTE — Telephone Encounter (Signed)
Attempted to call patient. There was a short ring and then a busy signal. Patient has been admitted to the hospital; will close this encounter

## 2018-03-23 NOTE — Progress Notes (Signed)
Patient verbalizes understanding of all discharge instructions including medications and follow up appointment.

## 2018-03-23 NOTE — Discharge Summary (Addendum)
[facility] Discharge Summary  Patient name: Timothy Martinez Medical record number: [id number] Date of birth: 11-Jan-1951 Age: 67 y.o. Gender: male Date of Admission: 03/21/2018  Date of Discharge: 03/23/2018 Admitting Physician: Blane Ohara McDiarmid, MD  Primary Care Provider: [name], DO Consultants: None  Indication for Hospitalization: A Fib with RVR  Discharge Diagnoses/Problem List:  A Fib with RVR Abdominal Pain GERD Anemia Kypokalemia Hypomagnesemia Hypoalbuminemia Recent Weight Loss Type 2 Diabetes COPD HLD Chronic lower back pain with sciatica Alcohol use Tobacco use History of solitary pulmonary nodule  Disposition: Home  Discharge Condition: Stable  Discharge Exam:  General: 67 y.o. male in NAD Cardio: RRR no m/r/g Lungs: CTAB, no wheezing, no rhonchi, no crackles Abdomen: Soft, mild tenderness to palpation of epigastric pain, positive bowel sounds Skin: warm and dry Extremities: No edema  Brief Hospital Course:  Timothy Martinez is a 67 yo male who presented with increased abdominal pain and was then found to be in A Fib with RVR.  His abdominal pain was epigastric, likely from his known pancreatic pseudocyst.  This improved with scheduled tylenol and resumption of his home oxycodone.  He also had some burning chest pain that resolved with PPI and cardiac workup was negative. The patient's A Fib with RVR was likely 2/2 increased pain as well as missed metoprolol doses while he was in the ED.  It improved following a brief use of diltiazem drip and resumption of his home metoprolol.  The patient's rate was well-controlled and he was medically stable at the time of discharge.    Patient has a CHADS-VASc score of 1.  Issues for Follow Up:  1. Follow up Magnesium level as outpatient. 2. Patient has CT chest scan scheduled for 9/5 for f/u of pulmonary nodule 3. Patient is scheduled to follow up with GI on 9/21 for pancreatic  pseudocyst. 4. Follow up pain control.  Significant Procedures: None  Significant Labs and Imaging:  Recent Labs  Lab 03/21/18 2109 03/22/18 0608 03/23/18 0651  WBC 10.6* 10.6* 10.2  HGB 16.1 12.7* 13.5  HCT 49.5 39.4 42.9  PLT 316 263 278   Recent Labs  Lab 03/17/18 0921 03/18/18 0659 03/21/18 2109 03/22/18 0008 03/22/18 0436 03/23/18 0410 03/23/18 0651  NA 140 140 141  --  144  --  137  K 3.9 4.0 3.5  --  3.3*  --  3.5  CL 96* 103 97*  --  106  --  101  CO2 26 25 32  --  24  --  27  GLUCOSE 147* 100* 138*  --  115*  --  106*  BUN 20 27* 26*  --  20  --  11  CREATININE 1.21 1.00 1.19  --  1.00  --  0.88  CALCIUM 10.0 9.1 9.9  --  8.4*  --  8.9  MG  --   --   --  1.6*  --  1.4*  --   PHOS  --   --   --  3.8  --   --   --   ALKPHOS 238* 151* 218*  --  169*  --   --   AST 132* 55* 96*  --  102*  --   --   ALT 195* 106* 133*  --  116*  --   --   ALBUMIN 4.2 3.2* 4.0  --  2.9*  --   --     US Abdomen Complete  Result Date:  03/22/2018 CLINICAL DATA:  67 year old male with abdominal pain. History of splenectomy. Diabetes, hypertension. EXAM: ABDOMEN ULTRASOUND COMPLETE COMPARISON:  CT of the abdomen pelvis dated 03/11/2018 and ultrasound dated 03/11/2018 FINDINGS: Gallbladder: There is sludge within the gallbladder. No gallbladder wall thickening or pericholecystic fluid. Negative sonographic Murphy's sign. Common bile duct: Diameter: 6 mm Liver: The liver is unremarkable. Portal vein is patent on color Doppler imaging with normal direction of blood flow towards the liver. IVC: No abnormality visualized. Pancreas: Poorly visualized and obscured by bowel gas. Spleen: Splenectomy. Right Kidney: Length: 12.9 cm. Normal echogenicity. No hydronephrosis or shadowing stone. Left Kidney: Length: 13.9 cm. Normal echogenicity. No hydronephrosis or shadowing stone. Abdominal aorta: Ectasia of the visualized portion of the abdominal aorta measure up to 2.6 cm. Other findings: Distended  stomach with oral content. IMPRESSION: 1. Gallbladder sludge. No sonographic evidence of acute cholecystitis. 2. Distended stomach with gastric content. 3. Splenectomy. 4. A 2.6 cm abdominal aortic ectasia. Electronically Signed   By: Anner Crete M.D.   On: 03/22/2018 00:59   Results/Tests Pending at Time of Discharge: None  Discharge Medications:  Allergies as of 03/23/2018      Reactions   Prozac [fluoxetine Hcl] Nausea Only   Made the patient very sick to his stomach   Morphine And Related Swelling   Pt states he can tolerate oxycodone and hydromorphone   Penicillins Rash   Has patient had a PCN reaction causing immediate rash, facial/tongue/throat swelling, SOB or lightheadedness with hypotension: Yes Has patient had a PCN reaction causing severe rash involving mucus membranes or skin necrosis: No Has patient had a PCN reaction that required hospitalization: No Has patient had a PCN reaction occurring within the last 10 years: No If all of the above answers are "NO", then may proceed with Cephalosporin use.      Medication List    TAKE these medications   acetaminophen 325 MG tablet Commonly known as:  TYLENOL Take 2 tablets (650 mg total) by mouth every 6 (six) hours.   albuterol 108 (90 Base) MCG/ACT inhaler Commonly known as:  PROVENTIL HFA;VENTOLIN HFA Inhale 1-2 puffs into the lungs every 6 (six) hours as needed for wheezing or shortness of breath.   amitriptyline 10 MG tablet Commonly known as:  ELAVIL TAKE 1 TABLET BY MOUTH EVERYDAY AT BEDTIME   aspirin EC 81 MG tablet Take 1 tablet (81 mg total) by mouth as needed for up to 1 dose (chest pain).   atorvastatin 40 MG tablet Commonly known as:  LIPITOR Take 1 tablet (40 mg total) by mouth daily at 6 PM.   gabapentin 100 MG capsule Commonly known as:  NEURONTIN TAKE 1 CAPSULE BY MOUTH THREE TIMES A DAY   glucose blood test strip Use check fasting blood glucose qAM. Type 2 Diabetes E11.9   metFORMIN 1000 MG  tablet Commonly known as:  GLUCOPHAGE Take 1 tablet (1,000 mg total) by mouth 2 (two) times daily with a meal.   metoprolol tartrate 50 MG tablet Commonly known as:  LOPRESSOR Take 1.5 tablets (75 mg total) by mouth 2 (two) times daily.   mometasone-formoterol 200-5 MCG/ACT Aero Commonly known as:  DULERA Inhale 2 puffs into the lungs 2 (two) times daily.   nicotine 7 mg/24hr patch Commonly known as:  NICODERM CQ - dosed in mg/24 hr Place 1 patch (7 mg total) onto the skin daily. What changed:    when to take this  reasons to take this   nitroGLYCERIN 0.4 MG  SL tablet Commonly known as:  NITROSTAT Place 1 tablet (0.4 mg total) under the tongue every 5 (five) minutes as needed for chest pain.   ondansetron 4 MG tablet Commonly known as:  ZOFRAN Take 1 tablet (4 mg total) by mouth every 8 (eight) hours as needed for nausea or vomiting.   ONE TOUCH LANCETS Misc Check fasting blood glucose qAM. Type 2 DM. E11.9   ONE TOUCH ULTRA 2 w/Device Kit Dispense one glucometer. Diagnosis Type 2 Diabetes. E11.9   oxycodone 5 MG capsule Commonly known as:  OXY-IR Take 1 capsule (5 mg total) by mouth every 4 (four) hours as needed for up to 10 days.   pantoprazole 40 MG tablet Commonly known as:  PROTONIX Take 1 tablet (40 mg total) by mouth daily.   polyethylene glycol packet Commonly known as:  MIRALAX / GLYCOLAX Take 17 g by mouth daily.   rivaroxaban 20 MG Tabs tablet Commonly known as:  XARELTO Take 1 tablet (20 mg total) by mouth daily with supper.   vitamin B-12 500 MCG tablet Commonly known as:  CYANOCOBALAMIN Take 500 mcg by mouth daily.       Discharge Instructions: Please refer to Patient Instructions section of EMR for full details.  Patient was counseled important signs and symptoms that should prompt return to medical care, changes in medications, dietary instructions, activity restrictions, and follow up appointments.   Follow-Up Appointments: Follow-up  Information    Benay Pike, MD. Go on 03/28/2018.   Specialty:  Family Medicine Why:  at 3:45 pm Contact information: [address] [place] [address] [phone]           Cleophas Dunker, DO 03/23/2018, 4:07 PM PGY-1, [place]

## 2018-03-23 NOTE — Progress Notes (Signed)
Family Medicine Teaching Service Daily Progress Note Intern Pager: [phone]  Patient name: Timothy Martinez Medical record number: [id number] Date of birth: 08/29/1950 Age: 67 y.o. Gender: male  Primary Care Provider: [name], DO Consultants: None Code Status: Full  Pt Overview and Major Events to Date:  8/28 Admitted to FPTS  Assessment and Plan: TYRON MANETTA is a 67 y.o. male presenting with increased abdominal pain. PMH is significant for paroxysmal a fib, gerd, T2DM, COPD, HLD, chronic back pain, tobacco use, and previous alcohol use.  Atrial Fib with RVR, in setting of ParoxysmalAtrial Fibrillation Likely 2/2 missed metoprolol dose and increased abdominal pain.  On home metoprolol 75mg  BID.  HR overnight well-controlled, 60-70s.  EKG still pending. -cont home metoprolol 75mg  BID today - cont home xarelto 20mg  - cont to monitor on telemetry - f/u AM EKG - will give patient ASA and nitro to use prn at home for chest pain with instructions that if doesn't resolve after using either to call - likely d/c home pending EKG  Abdominal Pain Epigastric pain likely 2/2 known pancreatic pseudocyst.  GI aware that patient here following increased pain.  Verified with patient yesterday that prior to coming into the hospital, he had only taken a totally of two oxycodone in a day.  Believe that Oxycodone 5mg  q4h will be adequate pain control for him to make it to his GI follow-up in September.  -Pain control: tylenol scheduled and oxycodone 5mg  q4 hours PRN -Protonix 40mg  daily  -Zofran PRN for nausea  -cont to monitor CMP, CBC  GERD No further burning chest pain.  Doubt cardiac in etiology as trops negative.  EKG pending. - f/u AM EKG - protonix 40mg  QD  Anemia Hgb this AM 13.5. - f/u CBC in AM  Hypokalemia K this AM 3.5, repleted yesterday. - f/u BMP in AM  Hypomagnesemia  Mg this AM 1.4, replated with Mag sulfate 2g IV. -cont to monitor - will need repeat Mg as  outpatient  Hypoalbuminemia Albumin 4.0 on admission, 2.9 this AM. Could be due to dilution. Likely also 2/2 poor nutrition. - will cont to monitor on daily BMP - recommend Ensure or 2x Carnation instant breakfast daily  Recent Weight loss 20+ lb weight loss with night sweats in the past approximate 6 months, despite normal diet and activity. Likely related to his chronic pancreatitis not currently on enzyme replacement therapy and pancreatitic pseudocyst, however given his history of FOBT positive stools and smoking, must consider GI vs lung malignancy. Pt is already scheduled to follow up with GI 9/20 and has low dose CT chest scan scheduled for 9/5. - cont to monitor weight - follow up outpatient as scheduled  Type 2 Diabetes A1c 6.1 in 01/2018.Takes metformin 1000mg  BID at home. - sSSI - CBG q4  COPD: Stable Dulera BID and albuterol at home.  HLD Lipid panel in 10/2017 wnl. Takes Atorvastatin 40 mg daily at home.   Chronic lower back pain with sciatica: Stable No current pain.  Alcohol use Reports former h/o drinking beer, never any hard liquor. Last reporteddrink was a few months ago. CIWAs overnight 0. - cont to monitor on CIWA protocol  Tobacco Use Smokes 1-2 PPD.  Declines nicotine patch.  History of solitary pulmonary nodule Small stable 85mm nodule in the LL base last noted in 10/2017.  Scheduled for low dose CT chest scan scheduled on 9/5. -f/u outpatient as scheduled  FEN/GI: Soft PPx: Home Xarelto  Disposition: likely home today pending EKG  Subjective:  Patient denies complaints this AM.  Denies chest pain, denies abdominal pain.  No SOB.  Objective: Temp:  [97.4 F (36.3 C)-98.4 F (36.9 C)] 97.6 F (36.4 C) (08/30 0429) Pulse Rate:  [68-78] 68 (08/30 0429) Resp:  [16-18] 18 (08/30 0429) BP: (104-130)/(50-77) 130/77 (08/30 0429) SpO2:  [94 %-97 %] 96 % (08/30 0826) Weight:  [68.7 kg] 68.7 kg (08/30 0429)   Physical Exam: General: 67 y.o. male in  NAD Cardio: RRR no m/r/g Lungs: CTAB, no wheezing, no rhonchi, no crackles Abdomen: Soft, mild tenderness to palpation of epigastric pain, positive bowel sounds Skin: warm and dry Extremities: No edema    Laboratory: Recent Labs  Lab 03/21/18 2109 03/22/18 0608 03/23/18 0651  WBC 10.6* 10.6* 10.2  HGB 16.1 12.7* 13.5  HCT 49.5 39.4 42.9  PLT 316 263 278   Recent Labs  Lab 03/18/18 0659 03/21/18 2109 03/22/18 0436 03/23/18 0651  NA 140 141 144 137  K 4.0 3.5 3.3* 3.5  CL 103 97* 106 101  CO2 25 32 24 27  BUN 27* 26* 20 11  CREATININE 1.00 1.19 1.00 0.88  CALCIUM 9.1 9.9 8.4* 8.9  PROT 6.8 8.0 6.1*  --   BILITOT 1.4* 1.2 1.3*  --   ALKPHOS 151* 218* 169*  --   ALT 106* 133* 116*  --   AST 55* 96* 102*  --   GLUCOSE 100* 138* 115* 106*      Imaging/Diagnostic Tests: No results found.  [place], DO 03/23/2018, 9:01 AM PGY-1, [place] Intern pager: [phone], text pages welcome

## 2018-03-28 ENCOUNTER — Other Ambulatory Visit: Payer: Self-pay

## 2018-03-28 ENCOUNTER — Encounter: Payer: Self-pay | Admitting: Family Medicine

## 2018-03-28 ENCOUNTER — Ambulatory Visit (INDEPENDENT_AMBULATORY_CARE_PROVIDER_SITE_OTHER): Payer: Medicare Other | Admitting: Family Medicine

## 2018-03-28 DIAGNOSIS — E876 Hypokalemia: Secondary | ICD-10-CM | POA: Insufficient documentation

## 2018-03-28 DIAGNOSIS — Z09 Encounter for follow-up examination after completed treatment for conditions other than malignant neoplasm: Secondary | ICD-10-CM | POA: Diagnosis not present

## 2018-03-28 DIAGNOSIS — K863 Pseudocyst of pancreas: Secondary | ICD-10-CM | POA: Diagnosis not present

## 2018-03-28 DIAGNOSIS — I482 Chronic atrial fibrillation, unspecified: Secondary | ICD-10-CM

## 2018-03-28 MED ORDER — OXYCODONE HCL 5 MG PO CAPS: 5.0000 mg | ORAL_CAPSULE | Freq: Every day | ORAL | 0 refills | Status: DC | PRN

## 2018-03-28 NOTE — Assessment & Plan Note (Signed)
Pt has pancreatic pseudocyst from chronic alcoholic pancreatitis. Is following up with GI on 9/20.  Has epigastric pain the AM that is relieved with oxycodone 5mg .  Gave him a 15 d Rx of 5mg  oxycodone to last until his GI appointment, told him to take if needed for pain.

## 2018-03-28 NOTE — Progress Notes (Signed)
   [facility] Phone: [phone] cc: hospital f/u for abdominal pain and afib w/ RVR.   Subjective:  Patient states he is doing well since hospital discharge.  He only gets epigastric pain in the morning which goes away after taking a 5mg  oxycodone.  He states he takes all his medications regularly and rarely misses a dose.    ROS: See HPI for pertinent positives and negatives   Objective: BP 128/70   Pulse 86   Temp 98 F (36.7 C) (Oral)   Ht 6\' 3"  (1.905 m)   Wt 152 lb 3.2 oz (69 kg)   SpO2 98%   BMI 19.02 kg/m  Gen: NAD, alert, cooperative with exam HEENT: NCAT, MMM CV: RRR, no murmur GI: mild tenderness to palpation in epigastric and LUQ, BS present, no guarding or organomegaly Msk: No edema, warm, normal tone, moves UE/LE spontaneously Neuro: Alert and oriented, no gross deficits Skin: No rashes, no lesions Psych: Appropriate behavior  Assessment/Plan: Chronic atrial fibrillation (HCC) Patient taking his metoprolol.  Normal rate and rhythm on exam.  No complaints. Told patient to continue taking his medication and followup with his cardiologist.    Pancreatic pseudocyst Pt has pancreatic pseudocyst from chronic alcoholic pancreatitis. Is following up with GI on 9/20.  Has epigastric pain the AM that is relieved with oxycodone 5mg .  Gave him a 15 d Rx of 5mg  oxycodone to last until his GI appointment, told him to take if needed for pain.    Hypokalemia During hospital admission patient had levels of 3.3-3.5.  Rechecked bmp today in office and will notify him of results.    Hypomagnesemia Patient had levels of 1.4 during hospital admission.  Rechecked mag levels in office today. Will notify pt of results and supplement if still low.      Clemetine Marker, MD PGY-1

## 2018-03-28 NOTE — Assessment & Plan Note (Signed)
Patient had levels of 1.4 during hospital admission.  Rechecked mag levels in office today. Will notify pt of results and supplement if still low.

## 2018-03-28 NOTE — Patient Instructions (Signed)
It was nice to meet you today,   I am glad you are doing better.  Keep taking the medicine as prescribed to you. I am giving you a 15 day prescription for oxycodone that should last you until your appointment on 9/20.  Please take only one a day if you need to for pain.  We are going to test your potassium and magnesium levels today and I will let you know if they are abnormal.  Please keep your other appointments on 9/5 and 9/20.    Thank you,   Clemetine Marker, MD.

## 2018-03-28 NOTE — Assessment & Plan Note (Signed)
Patient taking his metoprolol.  Normal rate and rhythm on exam.  No complaints. Told patient to continue taking his medication and followup with his cardiologist.

## 2018-03-28 NOTE — Assessment & Plan Note (Signed)
During hospital admission patient had levels of 3.3-3.5.  Rechecked bmp today in office and will notify him of results.

## 2018-03-29 ENCOUNTER — Telehealth: Payer: Self-pay | Admitting: Family Medicine

## 2018-03-29 DIAGNOSIS — E875 Hyperkalemia: Secondary | ICD-10-CM

## 2018-03-29 LAB — BASIC METABOLIC PANEL
BUN/Creatinine Ratio: 15 (ref 10–24)
BUN: 14 mg/dL (ref 8–27)
CO2: 24 mmol/L (ref 20–29)
Calcium: 9.4 mg/dL (ref 8.6–10.2)
Chloride: 101 mmol/L (ref 96–106)
Creatinine, Ser: 0.91 mg/dL (ref 0.76–1.27)
GFR calc Af Amer: 101 mL/min/{1.73_m2} (ref >59)
GFR calc non Af Amer: 88 mL/min/{1.73_m2} (ref >59)
Glucose: 172 mg/dL — ABNORMAL HIGH (ref 65–99)
Potassium: 5.3 mmol/L — ABNORMAL HIGH (ref 3.5–5.2)
Sodium: 141 mmol/L (ref 134–144)

## 2018-03-29 LAB — MAGNESIUM: Magnesium: 1.1 mg/dL — ABNORMAL LOW (ref 1.6–2.3)

## 2018-03-29 MED ORDER — MAGNESIUM OXIDE 400 MG PO CAPS: 1.0000 | ORAL_CAPSULE | Freq: Two times a day (BID) | ORAL | 0 refills | Status: DC

## 2018-03-29 NOTE — Telephone Encounter (Signed)
Spoke with Mr. Denning on the phone about yesterday's Mg value of 1.1.  Informed him we would send in [place] Ox prescription, which he is to take today, and then repeat the Mg and a CMET tomorrow, 9/6, and that if it is still that low he would need to come to the hospital for IV magnesium supplementation.

## 2018-03-30 ENCOUNTER — Ambulatory Visit (HOSPITAL_COMMUNITY)
Admission: RE | Admit: 2018-03-30 | Discharge: 2018-03-30 | Disposition: A | Discharge status: Home / Self Care | Payer: Medicare Other | Source: Ambulatory Visit | Attending: Family Medicine | Admitting: Family Medicine

## 2018-03-30 ENCOUNTER — Other Ambulatory Visit: Payer: Medicare Other

## 2018-03-30 DIAGNOSIS — I700 Atherosclerosis of aorta: Secondary | ICD-10-CM | POA: Diagnosis not present

## 2018-03-30 DIAGNOSIS — Z122 Encounter for screening for malignant neoplasm of respiratory organs: Secondary | ICD-10-CM | POA: Diagnosis present

## 2018-03-30 DIAGNOSIS — J439 Emphysema, unspecified: Secondary | ICD-10-CM | POA: Insufficient documentation

## 2018-03-30 DIAGNOSIS — F17210 Nicotine dependence, cigarettes, uncomplicated: Secondary | ICD-10-CM | POA: Insufficient documentation

## 2018-03-30 DIAGNOSIS — E875 Hyperkalemia: Secondary | ICD-10-CM | POA: Diagnosis not present

## 2018-03-31 LAB — COMPREHENSIVE METABOLIC PANEL
ALT: 24 IU/L (ref 0–44)
AST: 13 IU/L (ref 0–40)
Albumin/Globulin Ratio: 1.3 (ref 1.2–2.2)
Albumin: 3.9 g/dL (ref 3.6–4.8)
Alkaline Phosphatase: 125 IU/L — ABNORMAL HIGH (ref 39–117)
BUN/Creatinine Ratio: 17 (ref 10–24)
BUN: 11 mg/dL (ref 8–27)
Bilirubin Total: 0.3 mg/dL (ref 0.0–1.2)
CO2: 24 mmol/L (ref 20–29)
Calcium: 9.3 mg/dL (ref 8.6–10.2)
Chloride: 99 mmol/L (ref 96–106)
Creatinine, Ser: 0.64 mg/dL — ABNORMAL LOW (ref 0.76–1.27)
GFR calc Af Amer: 118 mL/min/{1.73_m2} (ref >59)
GFR calc non Af Amer: 102 mL/min/{1.73_m2} (ref >59)
Globulin, Total: 3.1 g/dL (ref 1.5–4.5)
Glucose: 184 mg/dL — ABNORMAL HIGH (ref 65–99)
Potassium: 4.9 mmol/L (ref 3.5–5.2)
Sodium: 138 mmol/L (ref 134–144)
Total Protein: 7 g/dL (ref 6.0–8.5)

## 2018-03-31 LAB — MAGNESIUM: Magnesium: 1.3 mg/dL — ABNORMAL LOW (ref 1.6–2.3)

## 2018-04-02 ENCOUNTER — Telehealth: Payer: Self-pay | Admitting: *Deleted

## 2018-04-02 NOTE — Telephone Encounter (Signed)
Pt is calling for results of his chest CT. Ceilidh Torregrossa, Salome Spotted, CMA

## 2018-04-03 NOTE — Telephone Encounter (Signed)
Pt calling again for results. Wallace Cullens, RN

## 2018-04-04 ENCOUNTER — Other Ambulatory Visit: Payer: Self-pay | Admitting: Family Medicine

## 2018-04-04 DIAGNOSIS — R918 Other nonspecific abnormal finding of lung field: Secondary | ICD-10-CM

## 2018-04-04 NOTE — Telephone Encounter (Signed)
Pt calling again for results.

## 2018-04-04 NOTE — Progress Notes (Signed)
Ordering CT repeat chest as recommended by radiology based on CT chest findings of multiple bilateral lung nodules rated at RADS-3, likely benign.

## 2018-04-08 ENCOUNTER — Encounter: Payer: Self-pay | Admitting: Gastroenterology

## 2018-04-08 DIAGNOSIS — K860 Alcohol-induced chronic pancreatitis: Secondary | ICD-10-CM

## 2018-04-08 DIAGNOSIS — K862 Cyst of pancreas: Secondary | ICD-10-CM

## 2018-04-08 NOTE — Progress Notes (Signed)
Patient was last seen during his hospitalization in August the plan had been for a follow-up cross-sectional imaging study to be performed prior to his clinic visit to evaluate if the pancreatic head cyst was any larger and also to help in regards to plans for possible ERCP. I do not see an order for MRI/MRCP. I will place order and asked staff to schedule before his clinic visit at the end of the week.   Justice Britain, MD [facility] Office # [phone]

## 2018-04-09 ENCOUNTER — Other Ambulatory Visit: Payer: Self-pay | Admitting: Family Medicine

## 2018-04-09 NOTE — Progress Notes (Signed)
Thank you Patty. I would try again Tuesday and Wednesday at least once. If we get him, great, otherwise let's see if he shows up in clinic this week. Thank you again.

## 2018-04-09 NOTE — Progress Notes (Signed)
Dr Rush Landmark I have attempted to reach the pt on several occasions and also mailed a letter to have him reach out to our office to get him set up.  He has not returned the call or acknowledged the letter.

## 2018-04-13 ENCOUNTER — Ambulatory Visit: Payer: Medicare Other | Admitting: Gastroenterology

## 2018-04-13 ENCOUNTER — Encounter: Payer: Self-pay | Admitting: Gastroenterology

## 2018-04-13 ENCOUNTER — Telehealth: Payer: Self-pay | Admitting: Gastroenterology

## 2018-04-13 ENCOUNTER — Other Ambulatory Visit (INDEPENDENT_AMBULATORY_CARE_PROVIDER_SITE_OTHER): Payer: Medicare Other

## 2018-04-13 VITALS — BP 118/60 | HR 72 | Ht 75.0 in | Wt 159.2 lb

## 2018-04-13 DIAGNOSIS — Z1211 Encounter for screening for malignant neoplasm of colon: Secondary | ICD-10-CM | POA: Diagnosis not present

## 2018-04-13 DIAGNOSIS — K8689 Other specified diseases of pancreas: Secondary | ICD-10-CM

## 2018-04-13 DIAGNOSIS — K860 Alcohol-induced chronic pancreatitis: Secondary | ICD-10-CM

## 2018-04-13 DIAGNOSIS — K862 Cyst of pancreas: Secondary | ICD-10-CM

## 2018-04-13 DIAGNOSIS — F1011 Alcohol abuse, in remission: Secondary | ICD-10-CM

## 2018-04-13 DIAGNOSIS — K863 Pseudocyst of pancreas: Secondary | ICD-10-CM

## 2018-04-13 DIAGNOSIS — Z7901 Long term (current) use of anticoagulants: Secondary | ICD-10-CM

## 2018-04-13 LAB — AMYLASE: Amylase: 94 U/L (ref 27–131)

## 2018-04-13 LAB — HEPATIC FUNCTION PANEL
ALT: 9 U/L (ref 0–53)
AST: 13 U/L (ref 0–37)
Albumin: 4 g/dL (ref 3.5–5.2)
Alkaline Phosphatase: 80 U/L (ref 39–117)
Bilirubin, Direct: 0.1 mg/dL (ref 0.0–0.3)
Total Bilirubin: 0.4 mg/dL (ref 0.2–1.2)
Total Protein: 8.1 g/dL (ref 6.0–8.3)

## 2018-04-13 LAB — LIPASE: Lipase: 83 U/L — ABNORMAL HIGH (ref 11.0–59.0)

## 2018-04-13 MED ORDER — PEG 3350-KCL-NA BICARB-NACL 420 G PO SOLR: 4000.0000 mL | ORAL | 0 refills | Status: DC

## 2018-04-13 NOTE — Patient Instructions (Signed)
Normal BMI (Body Mass Index- based on height and weight) is between 23 and 30. Your BMI today is Body mass index is 19.9 kg/m. Marland Kitchen Please consider follow up  regarding your BMI with your Primary Care Provider.  Your provider has requested that you go to the basement level for lab work before leaving today. Press "B" on the elevator. The lab is located at the first door on the left as you exit the elevator.  You have been scheduled for a colonoscopy. Please follow written instructions given to you at your visit today.  Please pick up your prep supplies at the pharmacy within the next 1-3 days. If you use inhalers (even only as needed), please bring them with you on the day of your procedure. Your physician has requested that you go to www.startemmi.com and enter the access code given to you at your visit today. This web site gives a general overview about your procedure. However, you should still follow specific instructions given to you by our office regarding your preparation for the procedure.  We have scheduled you for a follow up appointment with Dr Rush Landmark on 05/23/18 at [facility] have been scheduled for an MRI at [address] They will contact you for an appointment. Your appointment time is . Please arrive 15 minutes prior to your appointment time for registration purposes. Please make certain not to have anything to eat or drink 6 hours prior to your test. In addition, if you have any metal in your body, have a pacemaker or defibrillator, please be sure to let your ordering physician know. This test typically takes 45 minutes to 1 hour to complete. Should you need to reschedule, please call [phone] to do so.

## 2018-04-13 NOTE — Telephone Encounter (Signed)
Patient notified and voiced understanding.

## 2018-04-13 NOTE — Telephone Encounter (Signed)
  Disregard previous comment.  Pt contacted by phone and doing well from cardiac standpoint. No CP or dyspnea.   Primary Cardiologist: Mertie Moores, MD  Chart reviewed as part of pre-operative protocol coverage. Given past medical history and time since last visit, based on ACC/AHA guidelines, Timothy Martinez would be at acceptable risk for the planned procedure without further cardiovascular testing.   Will route to pharmacy to address anticoagulation.   Lyda Jester, PA-C 04/13/2018, 11:29 AM

## 2018-04-13 NOTE — Telephone Encounter (Signed)
[facility] Pre-operative Risk Assessment     Request for surgical clearance:     Endoscopy Procedure  What type of surgery is being performed?     colooscopy  When is this surgery scheduled?     04/25/18  What type of clearance is required ?   Pharmacy  Are there any medications that need to be held prior to surgery and how long? [place] name and name of physician performing surgery?      [facility]  What is your office phone and fax number?      Phone- [phone]  [phone]  Anesthesia type (None, local, MAC, general) ?       MAC

## 2018-04-13 NOTE — Telephone Encounter (Signed)
Pt takes Xarelto for afib with CHADS2VASc score of 4 (age, DM, HTN, CHF). Renal function is normal. Pt was discharged from the hospital 8/30 and was found to be in afib (already had dx prior to this admission) due to increased pain and missed metoprolol dosing. He was rate controlled with diltiazem drip and resumed home metoprolol, no cardioversion was done. Ok to hold Xarelto for 1-2 days prior to colonoscopy.

## 2018-04-13 NOTE — Telephone Encounter (Signed)
   Primary Cardiologist:Philip Nahser, MD  Chart reviewed as part of pre-operative protocol coverage. Because of Timothy Martinez's past medical history and time since last visit, he/she will require a follow-up visit in order to better assess preoperative cardiovascular risk.  Pre-op covering staff: - Please schedule appointment and call patient to inform them. - Please contact requesting surgeon's office via preferred method (i.e, phone, fax) to inform them of need for appointment prior to surgery.  Lyda Jester, PA-C  04/13/2018, 11:09 AM

## 2018-04-13 NOTE — Progress Notes (Signed)
[place] VISIT   Primary Care Provider [name], DO [place] [address] [phone]  Referring Provider [name], DO [place], [place] [address] [phone]  Patient Profile: Timothy Martinez is a 67 y.o. male with a pmh significant for atrial fibrillation (on Xarelto), diabetes, status post splenectomy, recurrent acute on chronic pancreatitis secondary to alcohol consumption/alcohol abuse (manifested by dilated pancreas duct and possible pancreatic duct stone in neck body region as well as possible cysts), COPD, GERD, arthritis.  The patient presents to the [facility] for an evaluation and management of problem(s) noted below:  Problem List 1. Pancreatic pseudocyst   2. Alcohol use disorder, mild, in early remission, abuse   3. On continuous oral anticoagulation   4. Chronic alcoholic pancreatitis ([place])   5. Colon cancer screening   6. Pancreatic cyst   7. Dilation of pancreatic duct     History of Present Illness: I met this patient in the hospital in August when he presented with recurrent acute on chronic pancreatitis.  He has been imaged on multiple occasions when he is been hospitalized.  He has stopped drinking alcohol for the few months before his repeat admission.  At that time his cross-sectional imaging that showed an abnormal pancreatic head and proximal duodenum with an appearance suggesting a possible pseudocyst measuring 3.1 cm (this had progressed since April 2019) with concern for the development of possible high-grade impingement of the lumen.  Chronic intrahepatic biliary ductal dilation was noted as well as gallbladder distention without acute cholecystitis.  Chronic calcific pancreatitis with as noted.  At the time of his hospitalization in August his lipase was as high as 1500.  With symptomatic management via pain medication and enteral nutrition via liquids and  subsequently a soft diet he was able to be discharged with plan for follow-up in a few weeks with imaging of the abdomen to be reperformed before so that we could evaluate if the pseudocyst was still present or if this was potentially a cystic neoplasm that would require an FNA.  Today the patient returns and he has not undergone any cross-sectional imaging (we tried reaching him on multiple occasions to schedule).  The patient is doing exceedingly well.  He has no abdominal pain and has been slowly gaining weight.  He feels stronger than he has in many months.  He continues to be free of any alcohol consumption.  No evidence of jaundice or concern for obstruction.  He is worried about the possibility of recurrence of his symptoms but is being very hopeful.  He is asking about his need for colonoscopy for screening as he is overdue.  He continues to have formed bowel movements on a daily basis with no evidence of exocrine insufficiency.  GI Review of Systems Positive as above Negative for pyrosis, dysphagia, odynophagia, jaundice, abdominal pain, change in bowel habits, melena, hematochezia  Review of Systems General: Denies fevers/chills/weight loss HEENT: Denies oral lesions Cardiovascular: Denies chest pain Pulmonary: Denies shortness of breath Gastroenterological: See HPI Genitourinary: Denies darkened urine Hematological: Positive for easy bruising/bleeding due to his anticoagulation Dermatological: Denies jaundice Psychological: Mood is stable Musculoskeletal: Denies new arthralgias   Medications Current Outpatient Medications  Medication Sig Dispense Refill  . acetaminophen (TYLENOL) 325 MG tablet Take 2 tablets (650 mg total) by mouth every 6 (six) hours.    Marland Kitchen amitriptyline (ELAVIL) 10 MG tablet TAKE 1 TABLET BY MOUTH EVERYDAY AT BEDTIME 90 tablet 2  .  atorvastatin (LIPITOR) 40 MG tablet Take 1 tablet (40 mg total) by mouth daily at 6 PM. 90 tablet 3  . Blood Glucose Monitoring  Suppl (ONE TOUCH ULTRA 2) W/DEVICE KIT Dispense one glucometer. Diagnosis Type 2 Diabetes. E11.9 1 each 0  . cyanocobalamin 500 MCG tablet Take 500 mcg by mouth daily.    Marland Kitchen gabapentin (NEURONTIN) 100 MG capsule TAKE 1 CAPSULE BY MOUTH THREE TIMES A DAY 270 capsule 2  . glucose blood (ONE TOUCH ULTRA TEST) test strip Use check fasting blood glucose qAM. Type 2 Diabetes E11.9 100 each 3  . Magnesium Oxide 400 MG CAPS Take 1 capsule (400 mg total) by mouth 2 (two) times daily. (Patient taking differently: Take 1 capsule by mouth daily. ) 60 capsule 0  . metFORMIN (GLUCOPHAGE) 1000 MG tablet Take 1 tablet (1,000 mg total) by mouth 2 (two) times daily with a meal. 180 tablet 3  . metoprolol tartrate (LOPRESSOR) 50 MG tablet Take 1.5 tablets (75 mg total) by mouth 2 (two) times daily. 270 tablet 3  . mometasone-formoterol (DULERA) 200-5 MCG/ACT AERO Inhale 2 puffs into the lungs 2 (two) times daily. 1 Inhaler 3  . nitroGLYCERIN (NITROSTAT) 0.4 MG SL tablet Place 1 tablet (0.4 mg total) under the tongue every 5 (five) minutes as needed for chest pain. 100 tablet 3  . ondansetron (ZOFRAN) 4 MG tablet Take 1 tablet (4 mg total) by mouth every 8 (eight) hours as needed for nausea or vomiting. (Patient taking differently: Take 4 mg by mouth as needed for nausea or vomiting. ) 30 tablet 0  . ONE TOUCH LANCETS MISC Check fasting blood glucose qAM. Type 2 DM. E11.9 200 each 1  . pantoprazole (PROTONIX) 40 MG tablet TAKE 1 TABLET BY MOUTH EVERY DAY 30 tablet 5  . rivaroxaban (XARELTO) 20 MG TABS tablet Take 1 tablet (20 mg total) by mouth daily with supper. 30 tablet 5  . polyethylene glycol-electrolytes (NULYTELY/GOLYTELY) 420 g solution Take 4,000 mLs by mouth as directed. 4000 mL 0   No current facility-administered medications for this visit.     Allergies Allergies  Allergen Reactions  . Prozac [Fluoxetine Hcl] Nausea Only    Made the patient very sick to his stomach  . Morphine And Related Swelling     Pt states he can tolerate oxycodone and hydromorphone  . Penicillins Rash    Has patient had a PCN reaction causing immediate rash, facial/tongue/throat swelling, SOB or lightheadedness with hypotension: Yes Has patient had a PCN reaction causing severe rash involving mucus membranes or skin necrosis: No Has patient had a PCN reaction that required hospitalization: No Has patient had a PCN reaction occurring within the last 10 years: No If all of the above answers are "NO", then may proceed with Cephalosporin use.     Histories Past Medical History:  Diagnosis Date  . Acute on chronic pancreatitis ([place]) 05/17/2016  . Alcohol abuse   . Alcoholic pancreatitis   . Anginal pain ([place])   . Anxiety and depression 01/20/2015   PHQ 9 = 11 (01/20/15)    . Arthritis    "eat up w/it" (05/17/2016)  . Atrial fibrillation status post cardioversion ([place]) 08/2014  . Atrial flutter, unspecified   . CHF (congestive heart failure) ([place])   . Chronic anticoagulation   . Chronic bronchitis ([place])   . COLD (chronic obstructive lung disease) ([place])   . Daily headache    "need eye examined" (05/17/2016)  . GERD (gastroesophageal reflux disease)   .  Hematuria, microscopic 02/13/2015   Noted on UA - Recheck UA at next visit ~ 1 months   . Hyperlipidemia   . Hypertension   . Insomnia 02/11/2016  . Lateral epicondylitis of right elbow 03/17/2017  . Left foot pain 10/12/2013  . Low back pain 12/21/2012  . Malnutrition of moderate degree 05/18/2016  . Neck pain, bilateral posterior 10/15/2014  . Neuropathy 03/16/2015  . Pain of right upper extremity 05/30/2016  . Pancreatic pseudocyst    seen on CT scan 07/2014  . Pancreatitis 08/04/2014  . Permanent atrial fibrillation ([place]) 05/05/2017  . Pneumonia    "?a few times" (05/17/2016)  . 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 Tobacco abuse 05/30/2011   Heavy smoker up to 3 ppd down to 15 cigs per day in 09/2014   . Type II diabetes mellitus ([place])    Past Surgical History:    Procedure Laterality Date  . BACK SURGERY    . CARDIOVERSION N/A 09/22/2014   Procedure: CARDIOVERSION;  Surgeon: Thayer Headings, MD;  Location: [facility] ENDOSCOPY;  Service: Cardiovascular;  Laterality: N/A;  . NECK SURGERY  1974   not cervical, states had lesion on neck which was removed  . POSTERIOR LAMINECTOMY / DECOMPRESSION LUMBAR SPINE  10/2006   of L4, L5 and S1 with a 5-1 diskectomy, microdissection with the microscope/notes 10/24/2006  . SPLENECTOMY  2003   Social History   Socioeconomic History  . Marital status: Single    Spouse name: Not on file  . Number of children: 2  . Years of education: Not on file  . Highest education level: Not on file  Occupational History  . Occupation: retired  Scientific laboratory technician  . Financial resource strain: Not on file  . Food insecurity:    Worry: Not on file    Inability: Not on file  . Transportation needs:    Medical: Not on file    Non-medical: Not on file  Tobacco Use  . Smoking status: Current Every Day Smoker    Packs/day: 1.00    Years: 52.00    Pack years: 52.00    Types: Cigarettes    Start date: 04/09/1964  . Smokeless tobacco: Never Used  . Tobacco comment: down to one pack daily  Substance and Sexual Activity  . Alcohol use: Not Currently  . Drug use: No  . Sexual activity: Not Currently    Birth control/protection: Condom  Lifestyle  . Physical activity:    Days per week: Not on file    Minutes per session: Not on file  . Stress: Not on file  Relationships  . Social connections:    Talks on phone: Not on file    Gets together: Not on file    Attends religious service: Not on file    Active member of club or organization: Not on file    Attends meetings of clubs or organizations: Not on file    Relationship status: Not on file  . Intimate partner violence:    Fear of current or ex partner: Not on file    Emotionally abused: Not on file    Physically abused: Not on file    Forced sexual activity: Not on file  Other  Topics Concern  . 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     Admits to alcohol abuse in past- no longer drinking- last drink 11/2.  Family History  Problem Relation Age of Onset  . Diabetes Mother   . Aneurysm Mother   . Stroke Mother   . Hypertension Mother   . Alcohol abuse Father   . Cancer Father        Lung  . Cancer Sister        Lung Cancer  . Post-traumatic stress disorder Brother   . Heart attack Neg Hx   . Colon cancer Neg Hx   . Liver cancer Neg Hx   . Rectal cancer Neg Hx   . Esophageal cancer Neg Hx   . Stomach cancer Neg Hx    I have reviewed his medical, social, and family history in detail and updated the electronic medical record as necessary.    PHYSICAL EXAMINATION  BP 118/60   Pulse 72   Ht '6\' 3"'  (1.905 m)   Wt 159 lb 4 oz (72.2 kg)   BMI 19.90 kg/m  Wt Readings from Last 3 Encounters:  04/13/18 159 lb 4 oz (72.2 kg)  03/28/18 152 lb 3.2 oz (69 kg)  03/23/18 151 lb 8 oz (68.7 kg)  GEN: NAD, appears stated age, doesn't appear chronically ill PSYCH: Cooperative, without pressured speech EYE: Conjunctivae pink, sclerae anicteric ENT: MMM, without oral ulcers, no erythema or exudates noted NECK: Supple CV: RR without R/Gs  RESP: CTAB posteriorly, without wheezing GI: NABS, soft, minimal tenderness to very deep palpation in the midepigastrium, ND, without rebound or guarding, no HSM appreciated MSK/EXT: No lower extremity edema SKIN: No jaundice or spider angiomata NEURO:  Alert & Oriented x 3, no focal deficits, no evidence of asterixis   REVIEW OF DATA  I reviewed the following data at the time of this encounter:  GI Procedures and Studies  No relevant studies  Laboratory Studies  Reviewed in epic  Imaging Studies  8/19 CTAP IMPRESSION: 1. Abnormal pancreatic head and proximal duodenum with an appearance most suggestive of an obstructing  pseudocyst measuring 3.1 cm. This has progressed since 10/31/2017 and now results in high-grade impingement on the lumen of the 2nd portion of the duodenum. Endoscopy may be most valuable at this point. 2. Chronic but increased intrahepatic biliary ductal dilatation. Gallbladder distention without CT evidence of acute cholecystitis. 3. Chronic calcific pancreatitis with no acute pancreatic inflammation today. No peripancreatic lymphadenopathy. 4. Surgically absent spleen. 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 5. Chronic lower lobe lung disease. 6.  Aortic Atherosclerosis (ICD10-I70.0).   ASSESSMENT  Mr. Kotch is a 67 y.o. male with a pmh significant for atrial fibrillation (on Xarelto), diabetes, status post splenectomy, recurrent acute on chronic pancreatitis secondary to alcohol consumption/alcohol abuse (manifested by dilated pancreas duct and possible pancreatic duct stone in neck body region as well as possible cysts), COPD, GERD, arthritis.  The patient is seen today for evaluation and management of:  1. Pancreatic pseudocyst   2. Alcohol use disorder, mild, in early remission, abuse   3. On continuous oral anticoagulation   4. Chronic alcoholic pancreatitis ([place])   5. Colon cancer screening   6. Pancreatic cyst   7. Dilation of pancreatic duct    The patient has a history of acute on chronic pancreatitis as a result of long-standing alcohol abuse/disorder.  He has had multiple bouts of acute pancreatitis of his life and has had a chronically dilating pancreatic duct.  Most recent hospitalization in August led to imaging that suggested a new fluid collection in the  head of the pancreas as well as the chronically dilated pancreatic duct.  There is what suggestive of a pancreatic duct stricture or pancreatic duct calcification within the actual duct itself.  He is abstaining from any alcohol at this point time and doing well with the  weight regain.  We had wanted to repeat cross-sectional imaging prior to this clinic visit but will obtain this after today's visit so that we can try and better delineate his anatomy.  I do think that endoscopic ultrasound and/or ERCP will be required for this patient in the coming weeks/months so that we can evaluate the area of PD dilation and ensure that there is no evidence of malignancy.  Would also like to ensure that his head of pancreas cyst is actually just a pseudocyst and not persistent cyst that may be mucinous in nature that would require potential intervention.  We did discuss briefly the role of ERCP for pancreatic duct stenting as well as consideration of trial of breakdown of any pancreatic duct stones that may be present.  I am concerned however that the more distal ventral duct may be very small and that attempts that stenting could be very difficult.  I also discussed with him the possible role of surgical evaluation in the future for a [place] procedure that may be helpful for him overall.  He will consider the role of surgical evaluation.  I would like to recheck his liver test today to see where things stand.  As well he knows to continue standing from alcohol.  He is going to focus on trying to continue to gain weight.  We will also plan to schedule for colonoscopy for colon cancer screening in the coming weeks.  Once evaluation has returned and his imaging is back we will see him back in clinic to discuss next steps including EUS and/or ERCP.  All patient questions were answered, to the best of my ability, and the patient agrees to the aforementioned plan of action with follow-up as indicated.   PLAN  1. Pancreatic pseudocyst - Amylase; Future - Lipase; Future - Hepatic function panel; Future - MR ABDOMEN MRCP W WO CONTAST; Future  2. Alcohol use disorder, mild, in early remission, abuse - Abstain from Alcohol - Minimize medications other than what is prescribed by your  providers - Tylenol can be used but must maintain <3000 mg Daily  3. On continuous oral anticoagulation  4. Chronic alcoholic pancreatitis ([place]) - Consideration of role of ERCP for PD stone attempt at breakdown or removal (this would be very difficult due to the size of the presumed stone in the duct) - Consideration of [place] procedure for long-term drainage may be required in future - IgG 4; Future  5. Colon cancer screening - Colonoscopy for screening purposes to be made  6. Pancreatic cyst - MR ABDOMEN MRCP W WO CONTAST; Future  7. Dilation of pancreatic duct -Likely a result of a PD stone and/or pancreatic duct stricture (will consider therapy or treatment in the future via ERCP if patient agrees)   Orders Placed This Encounter  Procedures  . MR ABDOMEN MRCP W WO CONTAST  . Amylase  . Lipase  . Hepatic function panel  . IgG 4  . Ambulatory referral to Gastroenterology    New Prescriptions   POLYETHYLENE GLYCOL-ELECTROLYTES (NULYTELY/GOLYTELY) 420 G SOLUTION    Take 4,000 mLs by mouth as directed.   Modified Medications   No medications on file    Planned Follow Up: No follow-ups  on file.   Justice Britain, MD [facility] Advanced Endoscopy Office # [phone]

## 2018-04-16 ENCOUNTER — Encounter: Payer: Self-pay | Admitting: Gastroenterology

## 2018-04-16 DIAGNOSIS — K8689 Other specified diseases of pancreas: Secondary | ICD-10-CM | POA: Insufficient documentation

## 2018-04-16 DIAGNOSIS — Z1211 Encounter for screening for malignant neoplasm of colon: Secondary | ICD-10-CM | POA: Insufficient documentation

## 2018-04-16 DIAGNOSIS — Z7189 Other specified counseling: Secondary | ICD-10-CM | POA: Insufficient documentation

## 2018-04-17 LAB — IGG 4: IgG, Subclass 4: 59 mg/dL (ref 2–96)

## 2018-04-20 ENCOUNTER — Other Ambulatory Visit: Payer: Self-pay | Admitting: Family Medicine

## 2018-04-22 ENCOUNTER — Ambulatory Visit
Admission: RE | Admit: 2018-04-22 | Discharge: 2018-04-22 | Disposition: A | Discharge status: Home / Self Care | Payer: Medicare Other | Source: Ambulatory Visit | Attending: Gastroenterology | Admitting: Gastroenterology

## 2018-04-22 DIAGNOSIS — K860 Alcohol-induced chronic pancreatitis: Secondary | ICD-10-CM

## 2018-04-22 DIAGNOSIS — Z7901 Long term (current) use of anticoagulants: Secondary | ICD-10-CM

## 2018-04-22 DIAGNOSIS — K861 Other chronic pancreatitis: Secondary | ICD-10-CM | POA: Diagnosis not present

## 2018-04-22 DIAGNOSIS — K8689 Other specified diseases of pancreas: Secondary | ICD-10-CM

## 2018-04-22 DIAGNOSIS — K863 Pseudocyst of pancreas: Secondary | ICD-10-CM

## 2018-04-22 DIAGNOSIS — F1011 Alcohol abuse, in remission: Secondary | ICD-10-CM

## 2018-04-22 DIAGNOSIS — Z1211 Encounter for screening for malignant neoplasm of colon: Secondary | ICD-10-CM

## 2018-04-22 DIAGNOSIS — R935 Abnormal findings on diagnostic imaging of other abdominal regions, including retroperitoneum: Secondary | ICD-10-CM | POA: Diagnosis not present

## 2018-04-22 DIAGNOSIS — K862 Cyst of pancreas: Secondary | ICD-10-CM

## 2018-04-22 MED ORDER — GADOBENATE DIMEGLUMINE 529 MG/ML IV SOLN
14.0000 mL | Freq: Once | INTRAVENOUS | Status: AC | PRN
  Administered 2018-04-22: 14 mL via INTRAVENOUS

## 2018-04-24 ENCOUNTER — Ambulatory Visit: Payer: Medicare Other | Admitting: Gastroenterology

## 2018-04-25 ENCOUNTER — Encounter: Payer: Self-pay | Admitting: Gastroenterology

## 2018-04-25 ENCOUNTER — Ambulatory Visit (AMBULATORY_SURGERY_CENTER): Payer: Medicare Other | Admitting: Gastroenterology

## 2018-04-25 VITALS — BP 120/62 | HR 70 | Temp 98.0°F | Resp 22 | Ht 75.0 in | Wt 159.0 lb

## 2018-04-25 DIAGNOSIS — D122 Benign neoplasm of ascending colon: Secondary | ICD-10-CM | POA: Diagnosis not present

## 2018-04-25 DIAGNOSIS — D126 Benign neoplasm of colon, unspecified: Secondary | ICD-10-CM

## 2018-04-25 DIAGNOSIS — D123 Benign neoplasm of transverse colon: Secondary | ICD-10-CM

## 2018-04-25 DIAGNOSIS — I2510 Atherosclerotic heart disease of native coronary artery without angina pectoris: Secondary | ICD-10-CM | POA: Diagnosis not present

## 2018-04-25 DIAGNOSIS — K635 Polyp of colon: Secondary | ICD-10-CM

## 2018-04-25 DIAGNOSIS — Z1211 Encounter for screening for malignant neoplasm of colon: Secondary | ICD-10-CM

## 2018-04-25 MED ORDER — SODIUM CHLORIDE 0.9 % IV SOLN: 500.0000 mL | Freq: Once | INTRAVENOUS | Status: DC

## 2018-04-25 NOTE — Progress Notes (Signed)
Called to room to assist during endoscopic procedure.  Patient ID and intended procedure confirmed with present staff. Received instructions for my participation in the procedure from the performing physician.  

## 2018-04-25 NOTE — Patient Instructions (Signed)
Restart Eliquis in 48 hours. Continue present medications. Repeat colonoscopy in 1 year. Please read handouts provided.    YOU HAD AN ENDOSCOPIC PROCEDURE TODAY AT [facility]:   Refer to the procedure report that was given to you for any specific questions about what was found during the examination.  If the procedure report does not answer your questions, please call your gastroenterologist to clarify.  If you requested that your care partner not be given the details of your procedure findings, then the procedure report has been included in a sealed envelope for you to review at your convenience later.  YOU SHOULD EXPECT: Some feelings of bloating in the abdomen. Passage of more gas than usual.  Walking can help get rid of the air that was put into your GI tract during the procedure and reduce the bloating. If you had a lower endoscopy (such as a colonoscopy or flexible sigmoidoscopy) you may notice spotting of blood in your stool or on the toilet paper. If you underwent a bowel prep for your procedure, you may not have a normal bowel movement for a few days.  Please Note:  You might notice some irritation and congestion in your nose or some drainage.  This is from the oxygen used during your procedure.  There is no need for concern and it should clear up in a day or so.  SYMPTOMS TO REPORT IMMEDIATELY:   Following lower endoscopy (colonoscopy or flexible sigmoidoscopy):  Excessive amounts of blood in the stool  Significant tenderness or worsening of abdominal pains  Swelling of the abdomen that is new, acute  Fever of 100F or higher    For urgent or emergent issues, a gastroenterologist can be reached at any hour by calling [phone].   DIET:  We do recommend a small meal at first, but then you may proceed to your regular diet.  Drink plenty of fluids but you should avoid alcoholic beverages for 24 hours.  ACTIVITY:  You should plan to take it easy for the rest  of today and you should NOT DRIVE or use heavy machinery until tomorrow (because of the sedation medicines used during the test).    FOLLOW UP: Our staff will call the number listed on your records the next business day following your procedure to check on you and address any questions or concerns that you may have regarding the information given to you following your procedure. If we do not reach you, we will leave a message.  However, if you are feeling well and you are not experiencing any problems, there is no need to return our call.  We will assume that you have returned to your regular daily activities without incident.  If any biopsies were taken you will be contacted by phone or by letter within the next 1-3 weeks.  Please call us at [phone] if you have not heard about the biopsies in 3 weeks.    SIGNATURES/CONFIDENTIALITY: You and/or your care partner have signed paperwork which will be entered into your electronic medical record.  These signatures attest to the fact that that the information above on your After Visit Summary has been reviewed and is understood.  Full responsibility of the confidentiality of this discharge information lies with you and/or your care-partner.

## 2018-04-25 NOTE — Op Note (Addendum)
[facility] Patient Name: Timothy Martinez Procedure Date: 04/25/2018 8:05 AM MRN: [id number] Endoscopist: Justice Britain , MD Age: 67 Referring MD:  Date of Birth: May 03, 1951 Gender: Male Account #: [id number] Procedure:                Colonoscopy Indications:              Screening for colorectal malignant neoplasm, Last                            colonoscopy >10 years ago Medicines:                Monitored Anesthesia Care Procedure:                Pre-Anesthesia Assessment:                           - Prior to the procedure, a History and Physical                            was performed, and patient medications and                            allergies were reviewed. The patient's tolerance of                            previous anesthesia was also reviewed. The risks                            and benefits of the procedure and the sedation                            options and risks were discussed with the patient.                            All questions were answered, and informed consent                            was obtained. Prior Anticoagulants: The patient has                            taken Eliquis (apixaban), last dose was 2 days                            prior to procedure. ASA Grade Assessment: II - A                            patient with mild systemic disease. After reviewing                            the risks and benefits, the patient was deemed in                            satisfactory condition to undergo the procedure.  After obtaining informed consent, the colonoscope                            was passed under direct vision. Throughout the                            procedure, the patient's blood pressure, pulse, and                            oxygen saturations were monitored continuously. The                            Colonoscope was introduced through the anus and                            advanced to the the  cecum, identified by                            appendiceal orifice and ileocecal valve. The                            colonoscopy was somewhat difficult due to                            unsatisfactory bowel prep and a tortuous colon.                            Successful completion of the procedure was aided by                            changing the patient's position, using manual                            pressure, straightening and shortening the scope to                            obtain bowel loop reduction, using scope torsion                            and lavage. The quality of the bowel preparation                            was evaluated using the BBPS [facility] Bowel                            Preparation Scale) with scores of: Right Colon = 1                            (portion of mucosa seen, but other areas not well                            seen due to staining, residual stool and/or opaque  liquid), Transverse Colon = 2 (minor amount of                            residual staining, small fragments of stool and/or                            opaque liquid, but mucosa seen well) and Left Colon                            = 2 (minor amount of residual staining, small                            fragments of stool and/or opaque liquid, but mucosa                            seen well). The total BBPS score equals 5. The                            quality of the bowel preparation was inadequate. Scope In: 8:11:46 AM Scope Out: 8:36:26 AM Scope Withdrawal Time: 0 hours 14 minutes 2 seconds  Total Procedure Duration: 0 hours 24 minutes 40 seconds  Findings:                 The digital rectal exam findings include                            non-thrombosed external hemorrhoids. Pertinent                            negatives include no palpable rectal lesions.                           Copious quantities of semi-liquid stool was found                             in the entire colon, precluding visualization.                            Lavage of the area was performed using copious                            amounts, resulting in incomplete clearance with                            continued poor visualization.                           Three sessile polyps were found in the transverse                            colon and ascending colon. The polyps were 2 to 5                            mm in size. These  polyps were removed with a cold                            snare. Resection and retrieval were complete.                           Non-bleeding non-thrombosed external and internal                            hemorrhoids were found during retroflexion and                            during perianal exam. The hemorrhoids were Grade I                            (internal hemorrhoids that do not prolapse). Complications:            No immediate complications. Estimated Blood Loss:     Estimated blood loss was minimal. Impression:               - Preparation of the colon was inadequate.                           - Non-thrombosed external hemorrhoids found on                            digital rectal exam.                           - Stool in the entire examined colon.                           - Three 2 to 5 mm polyps in the transverse colon                            and in the ascending colon, removed with a cold                            snare. Resected and retrieved.                           - Non-bleeding non-thrombosed external and internal                            hemorrhoids. Recommendation:           - The patient will be observed post-procedure,                            until all discharge criteria are met.                           - Discharge patient to home.                           - Patient has a contact number available for  emergencies. The signs and symptoms of potential                             delayed complications were discussed with the                            patient. Return to normal activities tomorrow.                            Written discharge instructions were provided to the                            patient.                           - Resume previous diet.                           - Continue present medications.                           - Would restart Eliquis in 48-hours to decrease                            risk of post-polypectomy bleeding.                           - Await pathology results.                           - Repeat colonoscopy in 1 year because the bowel                            preparation was suboptimal with a 2-day preparation                            as polyps could have been missed.                           - The findings and recommendations were discussed                            with the patient.                           - The findings and recommendations were discussed                            with the patient's family. Justice Britain, MD 04/25/2018 8:43:20 AM Addendum Number: 1   Addendum Date: 04/25/2018 8:44:43 AM      Patient to restart Xarelto not Eliquis in 48-hours. Justice Britain, MD 04/25/2018 8:45:00 AM

## 2018-04-25 NOTE — Progress Notes (Signed)
Report to PACU, RN, vss, BBS= Clear.  

## 2018-04-26 ENCOUNTER — Telehealth: Payer: Self-pay | Admitting: *Deleted

## 2018-04-26 NOTE — Telephone Encounter (Signed)
  Follow up Call-  Call back number 04/25/2018  Post procedure Call Back phone  # [phone]  Permission to leave phone message Yes  Some recent data might be hidden     Patient questions:  Do you have a fever, pain , or abdominal swelling? No. Pain Score  0 *  Have you tolerated food without any problems? Yes.    Have you been able to return to your normal activities? Yes.    Do you have any questions about your discharge instructions: Diet   No. Medications  No. Follow up visit  No.  Do you have questions or concerns about your Care? No.  Actions: * If pain score is 4 or above: No action needed, pain <4.

## 2018-04-27 ENCOUNTER — Telehealth: Payer: Self-pay | Admitting: Gastroenterology

## 2018-04-27 NOTE — Telephone Encounter (Signed)
The pt states when he called his stomach was really tight.  He has since passed gas and feels much better.  He was advised that if he has pain that does not go away or he develops any other symptoms; bleeding, fever, SOB, or the pain is not relieved with passing gas he is to go to the ED. The pt has been advised of the information and verbalized understanding.

## 2018-04-27 NOTE — Telephone Encounter (Signed)
Pt states that he has really bad pain in his stomach is really tight  Had procedure 10/2 colon

## 2018-05-01 ENCOUNTER — Encounter: Payer: Self-pay | Admitting: Gastroenterology

## 2018-05-23 ENCOUNTER — Ambulatory Visit: Payer: Medicare Other | Admitting: Gastroenterology

## 2018-05-23 ENCOUNTER — Encounter: Payer: Self-pay | Admitting: Gastroenterology

## 2018-05-23 VITALS — BP 112/52 | HR 64 | Ht 75.0 in | Wt 170.2 lb

## 2018-05-23 DIAGNOSIS — K861 Other chronic pancreatitis: Secondary | ICD-10-CM

## 2018-05-23 DIAGNOSIS — K862 Cyst of pancreas: Secondary | ICD-10-CM

## 2018-05-23 DIAGNOSIS — Z86010 Personal history of colonic polyps: Secondary | ICD-10-CM | POA: Diagnosis not present

## 2018-05-23 DIAGNOSIS — F1011 Alcohol abuse, in remission: Secondary | ICD-10-CM

## 2018-05-23 DIAGNOSIS — K8689 Other specified diseases of pancreas: Secondary | ICD-10-CM | POA: Diagnosis not present

## 2018-05-23 NOTE — Patient Instructions (Addendum)
Normal BMI (Body Mass Index- based on height and weight) is between 23 and 30. Your BMI today is Body mass index is 21.28 kg/m. Marland Kitchen Please consider follow up  regarding your BMI with your Primary Care Provider.  Your provider has requested that you go to the basement level for lab work before leaving today. Press "B" on the elevator. The lab is located at the first door on the left as you exit the elevator.   Thank you for entrusting me with your care and choosing [facility].  Dr Rush Landmark

## 2018-05-23 NOTE — Progress Notes (Signed)
[facility] VISIT   Primary Care Provider [name], DO [place] [address] [phone]  Patient Profile: Timothy Martinez is a 67 y.o. male with a pmh significant for atrial fibrillation (on Xarelto), diabetes, status post splenectomy, recurrent acute on chronic pancreatitis secondary to alcohol consumption/alcohol abuse (manifested by dilated pancreas duct and possible pancreatic duct stone in neck body region as well as possible cysts), COPD, GERD, arthritis.  The patient presents to the [facility] for an evaluation and management of problem(s) noted below:  Problem List 1. Chronic pancreatitis, unspecified pancreatitis type ([place])   2. Pancreatic cyst   3. Dilation of pancreatic duct   4. Alcohol use disorder, mild, in early remission, abuse   5. History of colonic polyps     History of Present Illness: Please see prior Hospital Consultation Notes & Outpatient Clinic Notes for full details of HPI.  Interval History: We completed his colonoscopy and he had some SSPs but also had a poor preparation and will be due for repeat within the next 1-year.  In regards to his MRI, there is persistence of the pancreatic head cyst though this was only about 27-monthafter his bout of pancreatitis so there is chance that this is still inflammatory driven; he also still has pancreatic duct dilation without any clear mass/lesion and it was not as apparent as to whether the.  He has continued to abstain from any alcohol.  He is gaining weight and feeling "too well" at times.  He denies any significant abdominal pains.  No nausea or vomiting.  No changes in bowel habits.  GI Review of Systems Positive as above Negative for pyrosis, dysphagia, odynophagia, jaundice, abdominal pain, melena, hematochezia  Review of Systems General: Positive for weight gain; denies fevers/chills HEENT: Denies oral lesions Cardiovascular: Denies  chest pain Pulmonary: Denies shortness of breath Gastroenterological: See HPI Genitourinary: Denies darkened urine Hematological: Positive for easy bruising/bleeding due to his anticoagulation Dermatological: Denies jaundice Psychological: Mood is stable   Medications Current Outpatient Medications  Medication Sig Dispense Refill  . acetaminophen (TYLENOL) 325 MG tablet Take 2 tablets (650 mg total) by mouth every 6 (six) hours.    .Marland Kitchenamitriptyline (ELAVIL) 10 MG tablet TAKE 1 TABLET BY MOUTH EVERYDAY AT BEDTIME 90 tablet 2  . atorvastatin (LIPITOR) 40 MG tablet Take 1 tablet (40 mg total) by mouth daily at 6 PM. 90 tablet 3  . Blood Glucose Monitoring Suppl (ONE TOUCH ULTRA 2) W/DEVICE KIT Dispense one glucometer. Diagnosis Type 2 Diabetes. E11.9 1 each 0  . cyanocobalamin 500 MCG tablet Take 500 mcg by mouth daily.    .Marland Kitchengabapentin (NEURONTIN) 100 MG capsule TAKE 1 CAPSULE BY MOUTH THREE TIMES A DAY 270 capsule 2  . glucose blood (ONE TOUCH ULTRA TEST) test strip Use check fasting blood glucose qAM. Type 2 Diabetes E11.9 100 each 3  . Magnesium Oxide -Mg Supplement 400 MG CAPS Take 1 capsule by mouth daily. 60 capsule 0  . metFORMIN (GLUCOPHAGE) 1000 MG tablet Take 1 tablet (1,000 mg total) by mouth 2 (two) times daily with a meal. 180 tablet 3  . metoprolol tartrate (LOPRESSOR) 50 MG tablet Take 1.5 tablets (75 mg total) by mouth 2 (two) times daily. 270 tablet 3  . mometasone-formoterol (DULERA) 200-5 MCG/ACT AERO Inhale 2 puffs into the lungs 2 (two) times daily. 1 Inhaler 3  . nitroGLYCERIN (NITROSTAT) 0.4 MG SL tablet Place 1 tablet (0.4 mg total) under the tongue  every 5 (five) minutes as needed for chest pain. 100 tablet 3  . ondansetron (ZOFRAN) 4 MG tablet Take 1 tablet (4 mg total) by mouth every 8 (eight) hours as needed for nausea or vomiting. (Patient taking differently: Take 4 mg by mouth as needed for nausea or vomiting. ) 30 tablet 0  . ONE TOUCH LANCETS MISC Check fasting  blood glucose qAM. Type 2 DM. E11.9 200 each 1  . pantoprazole (PROTONIX) 40 MG tablet TAKE 1 TABLET BY MOUTH EVERY DAY 30 tablet 5  . polyethylene glycol-electrolytes (NULYTELY/GOLYTELY) 420 g solution Take 4,000 mLs by mouth as directed. 4000 mL 0  . rivaroxaban (XARELTO) 20 MG TABS tablet Take 1 tablet (20 mg total) by mouth daily with supper. 30 tablet 5   No current facility-administered medications for this visit.     Allergies Allergies  Allergen Reactions  . Prozac [Fluoxetine Hcl] Nausea Only    Made the patient very sick to his stomach  . Morphine And Related Swelling    Pt states he can tolerate oxycodone and hydromorphone  . Penicillins Rash    Has patient had a PCN reaction causing immediate rash, facial/tongue/throat swelling, SOB or lightheadedness with hypotension: Yes Has patient had a PCN reaction causing severe rash involving mucus membranes or skin necrosis: No Has patient had a PCN reaction that required hospitalization: No Has patient had a PCN reaction occurring within the last 10 years: No If all of the above answers are "NO", then may proceed with Cephalosporin use.     Histories Past Medical History:  Diagnosis Date  . Acute on chronic pancreatitis ([place]) 05/17/2016  . Alcohol abuse   . Alcoholic pancreatitis   . Anginal pain ([place])   . Anxiety and depression 01/20/2015   PHQ 9 = 11 (01/20/15)    . Arthritis    "eat up w/it" (05/17/2016)  . Atrial fibrillation status post cardioversion ([place]) 08/2014  . Atrial flutter, unspecified   . CHF (congestive heart failure) ([place])   . Chronic anticoagulation   . Chronic bronchitis ([place])   . COLD (chronic obstructive lung disease) ([place])   . Daily headache    "need eye examined" (05/17/2016)  . GERD (gastroesophageal reflux disease)   . Hematuria, microscopic 02/13/2015   Noted on UA - Recheck UA at next visit ~ 1 months   . Hyperlipidemia   . Hypertension   . Insomnia 02/11/2016  . Lateral epicondylitis of  right elbow 03/17/2017  . Left foot pain 10/12/2013  . Low back pain 12/21/2012  . Malnutrition of moderate degree 05/18/2016  . Neck pain, bilateral posterior 10/15/2014  . Neuropathy 03/16/2015  . Pain of right upper extremity 05/30/2016  . Pancreatic pseudocyst    seen on CT scan 07/2014  . Pancreatitis 08/04/2014  . Permanent atrial fibrillation 05/05/2017  . Pneumonia    "?a few times" (05/17/2016)  . 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 Tobacco abuse 05/30/2011   Heavy smoker up to 3 ppd down to 15 cigs per day in 09/2014   . Type II diabetes mellitus ([place])    Past Surgical History:  Procedure Laterality Date  . BACK SURGERY    . CARDIOVERSION N/A 09/22/2014   Procedure: CARDIOVERSION;  Surgeon: Thayer Headings, MD;  Location: [facility] ENDOSCOPY;  Service: Cardiovascular;  Laterality: N/A;  . NECK SURGERY  1974   not cervical, states had lesion on neck which was removed  . POSTERIOR LAMINECTOMY / DECOMPRESSION LUMBAR SPINE  10/2006   of L4,  L5 and S1 with a 5-1 diskectomy, microdissection with the microscope/notes 10/24/2006  . SPLENECTOMY  2003   Social History   Socioeconomic History  . Marital status: Single    Spouse name: Not on file  . Number of children: 2  . Years of education: Not on file  . Highest education level: Not on file  Occupational History  . Occupation: retired  Scientific laboratory technician  . Financial resource strain: Not on file  . Food insecurity:    Worry: Not on file    Inability: Not on file  . Transportation needs:    Medical: Not on file    Non-medical: Not on file  Tobacco Use  . Smoking status: Current Every Day Smoker    Packs/day: 1.00    Years: 52.00    Pack years: 52.00    Types: Cigarettes    Start date: 04/09/1964  . Smokeless tobacco: Never Used  . Tobacco comment: down to one pack daily  Substance and Sexual Activity  . Alcohol use: Not Currently  . Drug use: No  . Sexual activity: Not Currently    Birth control/protection: Condom  Lifestyle  . Physical  activity:    Days per week: Not on file    Minutes per session: Not on file  . Stress: Not on file  Relationships  . Social connections:    Talks on phone: Not on file    Gets together: Not on file    Attends religious service: Not on file    Active member of club or organization: Not on file    Attends meetings of clubs or organizations: Not on file    Relationship status: Not on file  . Intimate partner violence:    Fear of current or ex partner: Not on file    Emotionally abused: Not on file    Physically abused: Not on file    Forced sexual activity: Not on file  Other Topics Concern  . 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     Admits to alcohol abuse in past- no longer drinking- last drink 11/2.    Family History  Problem Relation Age of Onset  . Diabetes Mother   . Aneurysm Mother   . Stroke Mother   . Hypertension Mother   . Alcohol abuse Father   . Cancer Father        Lung  . Cancer Sister        Lung Cancer  . Post-traumatic stress disorder Brother   . Heart attack Neg Hx   . Colon cancer Neg Hx   . Liver cancer Neg Hx   . Rectal cancer Neg Hx   . Esophageal cancer Neg Hx   . Stomach cancer Neg Hx    I have reviewed his medical, social, and family history in detail and updated the electronic medical record as necessary.    PHYSICAL EXAMINATION  BP (!) 112/52   Pulse 64   Ht '6\' 3"'  (1.905 m)   Wt 170 lb 4 oz (77.2 kg)   BMI 21.28 kg/m  Wt Readings from Last 3 Encounters:  05/23/18 170 lb 4 oz (77.2 kg)  04/25/18 159 lb (72.1 kg)  04/13/18 159 lb 4 oz (72.2 kg)  GEN: NAD, appears stated age, doesn't appear chronically ill PSYCH: Cooperative, without pressured speech EYE: Conjunctivae pink, sclerae anicteric ENT: MMM CV: RR without R/Gs  RESP: CTAB posteriorly, without wheezing GI: NABS, soft, NT, ND, without rebound or guarding, no HSM appreciated MSK/EXT: No  lower extremity edema SKIN: No jaundice or spider angiomata NEURO:  Alert & Oriented x 3, no focal deficits   REVIEW OF DATA  I reviewed the following data at the time of this encounter:  GI Procedures and Studies  10/19 Colonoscopy - Preparation of the colon was inadequate. - Non-thrombosed external hemorrhoids found on digital rectal exam. - Stool in the entire examined colon. - Three 2 to 5 mm polyps in the transverse colon and in the ascending colon, removed with a cold snare. Resected and retrieved. - Non-bleeding non-thrombosed external and internal hemorrhoids. Pathology consistent with SSP & Hyperplastic Polyps  Laboratory Studies  Reviewed in epic  Imaging Studies  9/19 MRI-MRCP IMPRESSION: 1. Stable findings of chronic pancreatitis. Several nonenhancing cystic pancreatic lesions, largest 3.4 cm in the pancreatic head abutting the descending duodenum, all stable since 03/11/2018 CT and compatible with pancreatic pseudocysts. 2. Normal caliber common bile duct. Stable mild fullness of the central intrahepatic bile ducts. 3. Stable extrinsic narrowing of the portal-SMV confluence, which remains patent. No acute vascular abnormality. 4.  Aortic Atherosclerosis (ICD10-I70.0).   ASSESSMENT  Mr. Nielson is a 67 y.o. male with a pmh significant for atrial fibrillation (on Xarelto), diabetes, status post splenectomy, recurrent acute on chronic pancreatitis secondary to alcohol consumption/alcohol abuse (manifested by dilated pancreas duct and possible pancreatic duct stone in neck body region as well as possible cysts), COPD, GERD, arthritis.  The patient is seen today for evaluation and management of:  1. Chronic pancreatitis, unspecified pancreatitis type ([place])   2. Pancreatic cyst   3. Dilation of pancreatic duct   4. Alcohol use disorder, mild, in early remission, abuse   5. History of colonic polyps    The patient is doing very well at this point in time in regards to  abstaining from alcohol and having no significant episodes of recurrent pancreatitis.  We spent a good amount of time talking and reviewing his recent MRI/MRCP.  With current imaging we are not seeing any significant mass-lesions and the chronicity of the pancreatic duct dilation suggests more of an issue with chronic inflammation driven strictures.  His cyst remaining in place after 1-1.5 months after his bout of pancreatitis however, is something I am considering and want to ensure that he has not developed a mucinous cyst that was a reason for his recurrent episodes of pancreatitis more recently.  We will plan a repeat CT in 61-month (January) from his last image to see if anything has changed.  If it remains stable then I do think it would be reasonable to consider an EUS with FNA and ensure that we are not seeing any mass/lesion in the pancreas.  If it is much smaller we may not consider FNA but would probably consider EUS to ensure that we understand the etiology of the pancreatic duct dilation which is likely inflammatory driven stricturing but to also check for stone burden that could require an ERCP.  The risks of EUS including bleeding, infection, aspiration pneumonia and intestinal perforation were discussed as was the possibility it may not give a definitive diagnosis.  If a biopsy of the pancreas is done as part of the EUS, there is an additional risk of pancreatitis at the rate of about 1%.  It was explained that procedure related pancreatitis is typically mild, although can be severe and even life threatening, which is why we  do not perform random pancreatic biopsies and only biopsy a lesion we feel is concerning enough to warrant the risk.  The risks of an ERCP were discussed at length, including but not limited to the risk of perforation, bleeding, abdominal pain, post-ERCP pancreatitis (while usually mild can be severe and even life threatening).  For now we don't need either but he is aware of the  risks associated should we need them.  He also needs his Colonoscopy to be done within the next year and he wants to wait until 2020 for this which is fine as he does have now a history of SSPs.  I congratulated him on his continued alcohol abstinence.  All patient questions were answered, to the best of my ability, and the patient agrees to the aforementioned plan of action with follow-up as indicated.   PLAN  1. Chronic pancreatitis, unspecified pancreatitis type ([place]) - Lipase; Future - Hepatic function panel; Future - Consideration of role of EUS/ERCP for PD evaluation as MRI not showing stone in region but also ensure no mass - Consideration of [place] procedure for long-term drainage may be required in future but he is asymptomatic currently  2. Pancreatic cyst - Repeat CT-Abdomen in January (50-month since last image study)  3. Dilation of pancreatic duct - Monitoring and chronic over years so much less likely to be malignant but patient aware of very small chance of this being an issue and accepts the risk  4. Alcohol use disorder, mild, in early remission, abuse - Continuing to be abstinent  5. History of colonic polyps - Colonoscopy will need to be done within the next year as he had poor preparation but also now has documented polyps   Orders Placed This Encounter  Procedures  . Lipase  . Hepatic function panel    New Prescriptions   No medications on file   Modified Medications   No medications on file    Planned Follow Up: No follow-ups on file.   GJustice Britain MD [facility] Office # [phone]

## 2018-05-25 DIAGNOSIS — Z86010 Personal history of colonic polyps: Secondary | ICD-10-CM | POA: Insufficient documentation

## 2018-05-28 ENCOUNTER — Other Ambulatory Visit: Payer: Self-pay

## 2018-05-28 MED ORDER — MOMETASONE FURO-FORMOTEROL FUM 200-5 MCG/ACT IN AERO: 2.0000 | INHALATION_SPRAY | Freq: Two times a day (BID) | RESPIRATORY_TRACT | 3 refills | Status: DC

## 2018-05-30 ENCOUNTER — Encounter: Payer: Self-pay | Admitting: Cardiovascular Disease

## 2018-05-30 ENCOUNTER — Ambulatory Visit: Payer: Medicare Other | Admitting: Cardiovascular Disease

## 2018-05-30 VITALS — BP 120/56 | HR 69 | Ht 75.0 in | Wt 171.0 lb

## 2018-05-30 DIAGNOSIS — I480 Paroxysmal atrial fibrillation: Secondary | ICD-10-CM

## 2018-05-30 NOTE — Progress Notes (Signed)
Cardiology Office Note   Date:  05/30/2018   ID:  Timothy Martinez, DOB September 25, 1950, MRN [id number]  PCP:  Timothy Alpha, DO  Cardiologist:   Timothy Moores, MD   Chief Complaint  Patient presents with  . Atrial Fibrillation    Problem List: 1. Atrial fibrillation - paroxysmal  2. Chronic diastolic  CHF 3. COPD   Timothy Martinez is a 67 y.o. male who presents for  Follow up of atrial flutter / fib . I met him in the hospital with atrial flutter when he was admitted with acute pancreatitis.     Feb. 26, 2016:  Timothy Martinez is a 67 y.o. male who presents for follow up of his atrial fib and chronic systolic CHF.   HR is still irreg.   Still short of breath. Still smoking , has cut back  Has not missed any doses of Eliuquis  Sept. 2, 2016:    Doing well  has paroxysmal Afib.    Jan. 18, 2017:  Doing well.  Still in NSR .   BP and HR look great. No Cp or dyspnea.  Has cut back on his smoking - down from 3 ppd to 1 ppd. He is scheduled to have his 7 lower teeth extracted next week. He is at low risk for complications.   He will need to hold 3 doses of his Eliquis.   January 28, 2016: Timothy Martinez is doing well except he has weakness. Still smoking - 10 cigarettes a day  No CP , no dyspnea. Still very active - mows 4 hours a day .   May 30, 2018: Timothy Martinez is seen back today for follow-up of his atrial for ablation and chronic diastolic congestive heart failure. Thinks he is in NSR  Had an episode of atrial fibrillation in August when we will when he was hospitalized with pancreatitis.   Past Medical History:  Diagnosis Date  . Acute on chronic pancreatitis ([place]) 05/17/2016  . Alcohol abuse   . Alcoholic pancreatitis   . Anginal pain ([place])   . Anxiety and depression 01/20/2015   PHQ 9 = 11 (01/20/15)    . Arthritis    "eat up w/it" (05/17/2016)  . Atrial fibrillation status post cardioversion ([place]) 08/2014  . Atrial flutter, unspecified   . CHF (congestive  heart failure) ([place])   . Chronic anticoagulation   . Chronic bronchitis ([place])   . COLD (chronic obstructive lung disease) ([place])   . Daily headache    "need eye examined" (05/17/2016)  . GERD (gastroesophageal reflux disease)   . Hematuria, microscopic 02/13/2015   Noted on UA - Recheck UA at next visit ~ 1 months   . Hyperlipidemia   . Hypertension   . Insomnia 02/11/2016  . Lateral epicondylitis of right elbow 03/17/2017  . Left foot pain 10/12/2013  . Low back pain 12/21/2012  . Malnutrition of moderate degree 05/18/2016  . Neck pain, bilateral posterior 10/15/2014  . Neuropathy 03/16/2015  . Pain of right upper extremity 05/30/2016  . Pancreatic pseudocyst    seen on CT scan 07/2014  . Pancreatitis 08/04/2014  . Permanent atrial fibrillation 05/05/2017  . Pneumonia    "?a few times" (05/17/2016)  . 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 Tobacco abuse 05/30/2011   Heavy smoker up to 3 ppd down to 15 cigs per day in 09/2014   . Type II diabetes mellitus ([place])     Past Surgical History:  Procedure Laterality Date  . BACK SURGERY    .  CARDIOVERSION N/A 09/22/2014   Procedure: CARDIOVERSION;  Surgeon: Thayer Headings, MD;  Location: [facility] ENDOSCOPY;  Service: Cardiovascular;  Laterality: N/A;  . NECK SURGERY  1974   not cervical, states had lesion on neck which was removed  . POSTERIOR LAMINECTOMY / DECOMPRESSION LUMBAR SPINE  10/2006   of L4, L5 and S1 with a 5-1 diskectomy, microdissection with the microscope/notes 10/24/2006  . SPLENECTOMY  2003     Current Outpatient Medications  Medication Sig Dispense Refill  . acetaminophen (TYLENOL) 325 MG tablet Take 2 tablets (650 mg total) by mouth every 6 (six) hours.    Marland Kitchen amitriptyline (ELAVIL) 10 MG tablet TAKE 1 TABLET BY MOUTH EVERYDAY AT BEDTIME 90 tablet 2  . atorvastatin (LIPITOR) 40 MG tablet Take 1 tablet (40 mg total) by mouth daily at 6 PM. 90 tablet 3  . Blood Glucose Monitoring Suppl (ONE TOUCH ULTRA 2) W/DEVICE KIT Dispense one glucometer.  Diagnosis Type 2 Diabetes. E11.9 1 each 0  . cyanocobalamin 500 MCG tablet Take 500 mcg by mouth daily.    Marland Kitchen gabapentin (NEURONTIN) 100 MG capsule TAKE 1 CAPSULE BY MOUTH THREE TIMES A DAY 270 capsule 2  . glucose blood (ONE TOUCH ULTRA TEST) test strip Use check fasting blood glucose qAM. Type 2 Diabetes E11.9 100 each 3  . Magnesium Oxide -Mg Supplement 400 MG CAPS Take 1 capsule by mouth daily. 60 capsule 0  . metFORMIN (GLUCOPHAGE) 1000 MG tablet Take 1 tablet (1,000 mg total) by mouth 2 (two) times daily with a meal. 180 tablet 3  . metoprolol tartrate (LOPRESSOR) 50 MG tablet Take 1.5 tablets (75 mg total) by mouth 2 (two) times daily. 270 tablet 3  . mometasone-formoterol (DULERA) 200-5 MCG/ACT AERO Inhale 2 puffs into the lungs 2 (two) times daily. 1 Inhaler 3  . nitroGLYCERIN (NITROSTAT) 0.4 MG SL tablet Place 1 tablet (0.4 mg total) under the tongue every 5 (five) minutes as needed for chest pain. 100 tablet 3  . ondansetron (ZOFRAN) 4 MG tablet Take 1 tablet (4 mg total) by mouth every 8 (eight) hours as needed for nausea or vomiting. (Patient taking differently: Take 4 mg by mouth as needed for nausea or vomiting. ) 30 tablet 0  . ONE TOUCH LANCETS MISC Check fasting blood glucose qAM. Type 2 DM. E11.9 200 each 1  . pantoprazole (PROTONIX) 40 MG tablet TAKE 1 TABLET BY MOUTH EVERY DAY 30 tablet 5  . polyethylene glycol-electrolytes (NULYTELY/GOLYTELY) 420 g solution Take 4,000 mLs by mouth as directed. 4000 mL 0  . rivaroxaban (XARELTO) 20 MG TABS tablet Take 1 tablet (20 mg total) by mouth daily with supper. 30 tablet 5   No current facility-administered medications for this visit.     Allergies:   Prozac [fluoxetine hcl]; Morphine and related; and Penicillins    Social History:  The patient  reports that he has been smoking cigarettes. He started smoking about 54 years ago. He has a 52.00 pack-year smoking history. He has never used smokeless tobacco. He reports that he drank  alcohol. He reports that he does not use drugs.   Family History:  The patient's family history includes Alcohol abuse in his father; Aneurysm in his mother; Cancer in his father and sister; Diabetes in his mother; Hypertension in his mother; Post-traumatic stress disorder in his brother; Stroke in his mother.    ROS:  Please see the history of present illness.       Physical Exam: Blood pressure (!) 120/56,  pulse 69, height '6\' 3"'  (1.905 m), weight 171 lb (77.6 kg), SpO2 97 %.  GEN:  Thin, middle age man  HEENT: Normal NECK: No JVD; No carotid bruits LYMPHATICS: No lymphadenopathy CARDIAC: RRR  RESPIRATORY:  Clear to auscultation without rales, wheezing or rhonchi  ABDOMEN: Soft, non-tender, non-distended MUSCULOSKELETAL:  No edema; No deformity  SKIN: Warm and dry NEUROLOGIC:  Alert and oriented x 3     EKG:      Recent Labs: 03/22/2018: TSH 2.634 03/23/2018: Hemoglobin 13.5; Platelets 278 03/30/2018: BUN 11; Creatinine, Ser 0.64; Magnesium 1.3; Potassium 4.9; Sodium 138 04/13/2018: ALT 9    Lipid Panel    Component Value Date/Time   CHOL 93 11/01/2017 0643   TRIG 77 11/01/2017 0643   HDL 37 (L) 11/01/2017 0643   CHOLHDL 2.5 11/01/2017 0643   VLDL 15 11/01/2017 0643   LDLCALC 41 11/01/2017 0643      Wt Readings from Last 3 Encounters:  05/30/18 171 lb (77.6 kg)  05/23/18 170 lb 4 oz (77.2 kg)  04/25/18 159 lb (72.1 kg)      Other studies Reviewed: Additional studies/ records that were reviewed today include: . Review of the above records demonstrates:    ASSESSMENT AND PLAN:  1. Atrial fibrillation:    Has maintained NSR  Cont meds.   Continue Xarelto 20 mg   2. Chronic diastolic  congestive heart failure:  Doing well ,  Continue meds.   3. COPD:    Advised smoking cessation    Current medicines are reviewed at length with the patient today.  The patient does not have concerns regarding medicines.  The following changes have been made:      Disposition:   FU with an APP in 12  months.    Signed, Timothy Moores, MD  05/30/2018 10:02 AM    [facility] [address], [place], [place]  [address] Phone: [phone]; Fax: [phone]

## 2018-05-30 NOTE — Patient Instructions (Signed)

## 2018-06-12 NOTE — Progress Notes (Signed)
  Subjective:   Patient ID: Timothy Martinez    DOB: [birth year], 67 y.o. male   MRN: [id number]  Timothy Martinez is a 67 y.o. male with a history of afib, COPD, GERD, chronic pancreatitis, diabetes, HLD, tobacco use here for   Ankle pain Patient endorses 2 week h/o feeling like "knives are stabbing" in both sides of L ankle. Worse at night. Denies pain with walking but does note that when he uses his cane, his ankle doesn't hurt as much at the end of the day. Reports some numbness and tingling in his toes on L foot but states this is not new, has been present since back surgery in 2005, denies hardware. Denies trauma to ankle or foot. Denies fevers, rashes. Pain is not reproducible. Has not tried medication for it.  Review of Systems:  Per HPI.  [place], medications and smoking status reviewed.  Objective:   BP 122/60   Pulse 65   Temp 97.9 F (36.6 C) (Oral)   Wt 172 lb 12.8 oz (78.4 kg)   SpO2 98%   BMI 21.60 kg/m  Vitals and nursing note reviewed.  General: elderly male, in no acute distress with non-toxic appearance Skin: warm, dry, no rashes or lesions Extremities: warm and well perfused, normal tone MSK: ROM grossly intact, 5/5 strength to bilateral LE and L foot, gait normal. No pain with palpation of foot or ankle, is unable to reproduce pain with walking. No pain overlying bilateral malleoli, base of 5th metatarsal head or extensor tendons. Neuro: Alert and oriented, speech normal  Assessment & Plan:   Pain of joint of left ankle and foot Likely 2/2 arthritis given inability to reproduce pain on exam or with walking and h/o pain worse at the end of the day. Advised conservative measures with heat/ice packs and tylenol. Will provide voltaren gel and obtain xray to determine arthritic changes.  Orders Placed This Encounter  Procedures  . DG Foot Complete Left    Standing Status:   Future    Standing Expiration Date:   08/14/2019    Order Specific Question:   Reason for Exam  (SYMPTOM  OR DIAGNOSIS REQUIRED)    Answer:   ankle pain, worse at night, possible arthritis    Order Specific Question:   Preferred imaging location?    Answer:   [facility]    Order Specific Question:   Radiology Contrast Protocol - do NOT remove file path    Answer:   \\[email or web address].pdf  . HgB A1c   Meds ordered this encounter  Medications  . Blood Glucose Monitoring Suppl (ONE TOUCH ULTRA 2) w/Device KIT    Sig: Dispense one glucometer. Diagnosis Type 2 Diabetes. E11.9    Dispense:  1 each    Refill:  0  . glucose blood (ONE TOUCH ULTRA TEST) test strip    Sig: Use check fasting blood glucose qAM. Type 2 Diabetes E11.9    Dispense:  100 each    Refill:  3  . ONE TOUCH LANCETS MISC    Sig: Check fasting blood glucose qAM. Type 2 DM. E11.9    Dispense:  200 each    Refill:  1  . diclofenac sodium (VOLTAREN) 1 % GEL    Sig: Apply 4 g topically 4 (four) times daily.    Dispense:  1 Tube    Refill:  2   Precepted with Dr. Okey Dupre.  Rory Percy, DO PGY-2, [facility] 06/13/2018 3:52 PM

## 2018-06-13 ENCOUNTER — Ambulatory Visit (INDEPENDENT_AMBULATORY_CARE_PROVIDER_SITE_OTHER): Payer: Medicare Other | Admitting: Family Medicine

## 2018-06-13 ENCOUNTER — Other Ambulatory Visit: Payer: Self-pay

## 2018-06-13 ENCOUNTER — Encounter: Payer: Self-pay | Admitting: Family Medicine

## 2018-06-13 VITALS — BP 122/60 | HR 65 | Temp 97.9°F | Wt 172.8 lb

## 2018-06-13 DIAGNOSIS — E119 Type 2 diabetes mellitus without complications: Secondary | ICD-10-CM

## 2018-06-13 DIAGNOSIS — M25572 Pain in left ankle and joints of left foot: Secondary | ICD-10-CM | POA: Diagnosis not present

## 2018-06-13 HISTORY — DX: Pain in left ankle and joints of left foot: M25.572

## 2018-06-13 LAB — POCT GLYCOSYLATED HEMOGLOBIN (HGB A1C): HbA1c, POC (controlled diabetic range): 6.7 % (ref 0.0–7.0)

## 2018-06-13 MED ORDER — ONETOUCH ULTRA 2 W/DEVICE KIT: PACK | 0 refills | Status: AC

## 2018-06-13 MED ORDER — ONETOUCH LANCETS MISC: 1 refills | Status: DC

## 2018-06-13 MED ORDER — GLUCOSE BLOOD VI STRP: ORAL_STRIP | 3 refills | Status: DC

## 2018-06-13 MED ORDER — DICLOFENAC SODIUM 1 % TD GEL: 4.0000 g | Freq: Four times a day (QID) | TRANSDERMAL | 2 refills | Status: DC

## 2018-06-13 NOTE — Assessment & Plan Note (Addendum)
Likely 2/2 arthritis given inability to reproduce pain on exam or with walking and h/o pain worse at the end of the day. Numbness and tingling likely related to back surgery given not acute. H/o well controlled diabetes on metformin, likely not contributing.  Advised conservative measures with heat/ice packs and tylenol. Will provide voltaren gel and obtain xray to determine arthritic changes.

## 2018-06-13 NOTE — Patient Instructions (Addendum)
It was great to see you!  Our plans for today:  - You likely have arthritis causing your pain. Use diclofenac gel on your ankle at night, you can use up to 4 times daily. - Use heat or ice packs to relieve pain - If your pain continues to bother you despite treatment, you should come back to the clinic to be seen.  Take care and seek immediate care sooner if you develop any concerns.   Dr. [facility]

## 2018-07-11 ENCOUNTER — Other Ambulatory Visit: Payer: Self-pay | Admitting: Gastroenterology

## 2018-07-11 DIAGNOSIS — K862 Cyst of pancreas: Secondary | ICD-10-CM

## 2018-07-11 DIAGNOSIS — K861 Other chronic pancreatitis: Secondary | ICD-10-CM

## 2018-08-20 ENCOUNTER — Ambulatory Visit
Admission: RE | Admit: 2018-08-20 | Discharge: 2018-08-20 | Disposition: A | Discharge status: Home / Self Care | Payer: Medicare Other | Source: Ambulatory Visit | Attending: Gastroenterology | Admitting: Gastroenterology

## 2018-08-20 ENCOUNTER — Ambulatory Visit (HOSPITAL_COMMUNITY): Payer: Medicare Other

## 2018-08-20 DIAGNOSIS — K861 Other chronic pancreatitis: Secondary | ICD-10-CM

## 2018-08-20 DIAGNOSIS — K862 Cyst of pancreas: Secondary | ICD-10-CM

## 2018-08-20 DIAGNOSIS — R932 Abnormal findings on diagnostic imaging of liver and biliary tract: Secondary | ICD-10-CM | POA: Diagnosis not present

## 2018-08-20 MED ORDER — GADOBENATE DIMEGLUMINE 529 MG/ML IV SOLN
16.0000 mL | Freq: Once | INTRAVENOUS | Status: AC | PRN
  Administered 2018-08-20: 16 mL via INTRAVENOUS

## 2018-09-11 ENCOUNTER — Encounter: Payer: Self-pay | Admitting: Gastroenterology

## 2018-09-11 ENCOUNTER — Ambulatory Visit: Payer: Medicare Other | Admitting: Gastroenterology

## 2018-09-11 VITALS — BP 123/77 | HR 60 | Ht 75.0 in | Wt 173.8 lb

## 2018-09-11 DIAGNOSIS — K8689 Other specified diseases of pancreas: Secondary | ICD-10-CM

## 2018-09-11 DIAGNOSIS — K862 Cyst of pancreas: Secondary | ICD-10-CM | POA: Diagnosis not present

## 2018-09-11 DIAGNOSIS — K861 Other chronic pancreatitis: Secondary | ICD-10-CM | POA: Diagnosis not present

## 2018-09-11 DIAGNOSIS — Z86010 Personal history of colonic polyps: Secondary | ICD-10-CM

## 2018-09-11 DIAGNOSIS — F1011 Alcohol abuse, in remission: Secondary | ICD-10-CM | POA: Diagnosis not present

## 2018-09-11 NOTE — Progress Notes (Addendum)
[place] VISIT   Primary Care Provider [name], DO [place] [address] [phone]  Patient Profile: Timothy Martinez is a 68 y.o. male with a pmh significant for atrial fibrillation (on Xarelto), diabetes, status post splenectomy, recurrent acute on chronic pancreatitis secondary to alcohol consumption/alcohol abuse (manifested by dilated pancreas duct and possible pancreatic duct stone in neck body region as well as possible cysts), COPD, GERD, arthritis, colon polyps (SSPs).  The patient presents to the [facility] for an evaluation and management of problem(s) noted below:  Problem List 1. Pancreatic cyst   2. Dilation of pancreatic duct   3. Chronic pancreatitis, unspecified pancreatitis type ([place])   4. Alcohol use disorder, mild, in early remission, abuse   5. History of colonic polyps     History of Present Illness: Please see prior Hospital Consultation Notes & Outpatient Clinic Notes for full details of HPI.  Interval History: The patient returns for scheduled follow-up after his recent MRI/MRCP from January.  Has done exceedingly well and is now 6 months sober.  He has been able to do this without the use of AA.the patient is still smoking however he wants to try and quit.  He tried another oral medication without effectiveness.  He does understand that tobacco use increases the chance of recurrent pancreatitis.  Patient describes not having any severe abdominal discomfort or changes in bowel habits.  The patient denies any nausea or vomiting.  He is gaining weight.  GI Review of Systems Positive as above Negative for dysphagia, odynophagia, jaundice, change in bowel habits   Review of Systems General: Denies fever/chills Cardiovascular: Denies chest pain Pulmonary: Denies shortness of breath Gastroenterological: See HPI Genitourinary: Denies darkened urine Hematological: Positive for easy  bruising/bleeding due to his anticoagulation Dermatological: Denies jaundice Psychological: Mood is stable   Medications Current Outpatient Medications  Medication Sig Dispense Refill  . acetaminophen (TYLENOL) 325 MG tablet Take 2 tablets (650 mg total) by mouth every 6 (six) hours.    Marland Kitchen amitriptyline (ELAVIL) 10 MG tablet TAKE 1 TABLET BY MOUTH EVERYDAY AT BEDTIME 90 tablet 2  . atorvastatin (LIPITOR) 40 MG tablet Take 1 tablet (40 mg total) by mouth daily at 6 PM. 90 tablet 3  . Blood Glucose Monitoring Suppl (ONE TOUCH ULTRA 2) w/Device KIT Dispense one glucometer. Diagnosis Type 2 Diabetes. E11.9 1 each 0  . cyanocobalamin 500 MCG tablet Take 500 mcg by mouth daily.    . diclofenac sodium (VOLTAREN) 1 % GEL Apply 4 g topically 4 (four) times daily. 1 Tube 2  . gabapentin (NEURONTIN) 100 MG capsule TAKE 1 CAPSULE BY MOUTH THREE TIMES A DAY 270 capsule 2  . glucose blood (ONE TOUCH ULTRA TEST) test strip Use check fasting blood glucose qAM. Type 2 Diabetes E11.9 100 each 3  . metFORMIN (GLUCOPHAGE) 1000 MG tablet Take 1 tablet (1,000 mg total) by mouth 2 (two) times daily with a meal. 180 tablet 3  . metoprolol tartrate (LOPRESSOR) 50 MG tablet Take 1.5 tablets (75 mg total) by mouth 2 (two) times daily. 270 tablet 3  . mometasone-formoterol (DULERA) 200-5 MCG/ACT AERO Inhale 2 puffs into the lungs 2 (two) times daily. 1 Inhaler 3  . nitroGLYCERIN (NITROSTAT) 0.4 MG SL tablet Place 1 tablet (0.4 mg total) under the tongue every 5 (five) minutes as needed for chest pain. 100 tablet 3  . ondansetron (ZOFRAN) 4 MG tablet Take 1 tablet (4 mg total) by mouth every  8 (eight) hours as needed for nausea or vomiting. (Patient taking differently: Take 4 mg by mouth as needed for nausea or vomiting. ) 30 tablet 0  . ONE TOUCH LANCETS MISC Check fasting blood glucose qAM. Type 2 DM. E11.9 200 each 1  . pantoprazole (PROTONIX) 40 MG tablet TAKE 1 TABLET BY MOUTH EVERY DAY 30 tablet 5  . rivaroxaban  (XARELTO) 20 MG TABS tablet Take 1 tablet (20 mg total) by mouth daily with supper. 30 tablet 5   No current facility-administered medications for this visit.     Allergies Allergies  Allergen Reactions  . Prozac [Fluoxetine Hcl] Nausea Only    Made the patient very sick to his stomach  . Morphine And Related Swelling    Pt states he can tolerate oxycodone and hydromorphone  . Penicillins Rash    Has patient had a PCN reaction causing immediate rash, facial/tongue/throat swelling, SOB or lightheadedness with hypotension: Yes Has patient had a PCN reaction causing severe rash involving mucus membranes or skin necrosis: No Has patient had a PCN reaction that required hospitalization: No Has patient had a PCN reaction occurring within the last 10 years: No If all of the above answers are "NO", then may proceed with Cephalosporin use.     Histories Past Medical History:  Diagnosis Date  . Acute on chronic pancreatitis ([place]) 05/17/2016  . Alcohol abuse   . Alcoholic pancreatitis   . Anginal pain ([place])   . Anxiety and depression 01/20/2015   PHQ 9 = 11 (01/20/15)    . Arthritis    "eat up w/it" (05/17/2016)  . Atrial fibrillation status post cardioversion ([place]) 08/2014  . Atrial flutter, unspecified   . CHF (congestive heart failure) ([place])   . Chronic anticoagulation   . Chronic bronchitis ([place])   . COLD (chronic obstructive lung disease) ([place])   . Daily headache    "need eye examined" (05/17/2016)  . GERD (gastroesophageal reflux disease)   . Hematuria, microscopic 02/13/2015   Noted on UA - Recheck UA at next visit ~ 1 months   . Hyperlipidemia   . Hypertension   . Insomnia 02/11/2016  . Lateral epicondylitis of right elbow 03/17/2017  . Left foot pain 10/12/2013  . Low back pain 12/21/2012  . Malnutrition of moderate degree 05/18/2016  . Neck pain, bilateral posterior 10/15/2014  . Neuropathy 03/16/2015  . Pain of right upper extremity 05/30/2016  . Pancreatic pseudocyst     seen on CT scan 07/2014  . Pancreatitis 08/04/2014  . Permanent atrial fibrillation 05/05/2017  . Pneumonia    "?a few times" (05/17/2016)  . 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 Tobacco abuse 05/30/2011   Heavy smoker up to 3 ppd down to 15 cigs per day in 09/2014   . Type II diabetes mellitus ([place])    Past Surgical History:  Procedure Laterality Date  . BACK SURGERY    . CARDIOVERSION N/A 09/22/2014   Procedure: CARDIOVERSION;  Surgeon: Thayer Headings, MD;  Location: [facility] ENDOSCOPY;  Service: Cardiovascular;  Laterality: N/A;  . NECK SURGERY  1974   not cervical, states had lesion on neck which was removed  . POSTERIOR LAMINECTOMY / DECOMPRESSION LUMBAR SPINE  10/2006   of L4, L5 and S1 with a 5-1 diskectomy, microdissection with the microscope/notes 10/24/2006  . SPLENECTOMY  2003   Social History   Socioeconomic History  . Marital status: Single    Spouse name: Not on file  . Number of children: 2  .  Years of education: Not on file  . Highest education level: Not on file  Occupational History  . Occupation: retired  Scientific laboratory technician  . Financial resource strain: Not on file  . Food insecurity:    Worry: Not on file    Inability: Not on file  . Transportation needs:    Medical: Not on file    Non-medical: Not on file  Tobacco Use  . Smoking status: Current Every Day Smoker    Packs/day: 1.00    Years: 52.00    Pack years: 52.00    Types: Cigarettes    Start date: 04/09/1964  . Smokeless tobacco: Never Used  . Tobacco comment: down to one pack daily  Substance and Sexual Activity  . Alcohol use: Not Currently  . Drug use: No  . Sexual activity: Not Currently    Birth control/protection: Condom  Lifestyle  . Physical activity:    Days per week: Not on file    Minutes per session: Not on file  . Stress: Not on file  Relationships  . Social connections:    Talks on phone: Not on file    Gets together: Not on file    Attends religious service: Not on file    Active member of  club or organization: Not on file    Attends meetings of clubs or organizations: Not on file    Relationship status: Not on file  . Intimate partner violence:    Fear of current or ex partner: Not on file    Emotionally abused: Not on file    Physically abused: Not on file    Forced sexual activity: Not on file  Other Topics Concern  . 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     Admits to alcohol abuse in past- no longer drinking- last drink 11/2.    Family History  Problem Relation Age of Onset  . Diabetes Mother   . Aneurysm Mother   . Stroke Mother   . Hypertension Mother   . Alcohol abuse Father   . Cancer Father        Lung  . Cancer Sister        Lung Cancer  . Post-traumatic stress disorder Brother   . Heart attack Neg Hx   . Colon cancer Neg Hx   . Liver cancer Neg Hx   . Rectal cancer Neg Hx   . Esophageal cancer Neg Hx   . Stomach cancer Neg Hx   . Inflammatory bowel disease Neg Hx    I have reviewed his medical, social, and family history in detail and updated the electronic medical record as necessary.    PHYSICAL EXAMINATION  BP 123/77   Pulse 60   Ht '6\' 3"'  (1.905 m)   Wt 173 lb 12.8 oz (78.8 kg)   SpO2 96%   BMI 21.72 kg/m  Wt Readings from Last 3 Encounters:  09/11/18 173 lb 12.8 oz (78.8 kg)  06/13/18 172 lb 12.8 oz (78.4 kg)  05/30/18 171 lb (77.6 kg)  GEN: NAD, appears stated age, doesn't appear chronically ill PSYCH: Cooperative, without pressured speech EYE: Conjunctivae pink, sclerae anicteric ENT: MMM CV: RR without R/Gs  RESP: CTAB posteriorly, without wheezing GI: NABS, soft, NT, ND, without rebound or guarding, no HSM appreciated MSK/EXT: No lower extremity edema SKIN: No jaundice or spider angiomata NEURO:  Alert & Oriented x  3, no focal deficits   REVIEW OF DATA  I reviewed the following data at the time of this encounter:  GI Procedures  and Studies  Reviewed on prior  Laboratory Studies  Reviewed in epic  Imaging Studies  January 2020 MRI/MRCP abdomen IMPRESSION: 1. Stable chronic pancreatic duct dilatation without obstructing lesion identified. 2. Normal biliary tree. No choledocholithiasis. Normal gallbladder. 3. Ovoid cystic complex at the junction of the pancreas and second portion duodenum is slightly decreased in volume. This may represent postinflammatory collection but cannot exclude a cystic neoplasm. Recommend continued surveillance versus endoscopic evaluation.   ASSESSMENT  Mr. Melhorn is a 68 y.o. male with a pmh significant for atrial fibrillation (on Xarelto), diabetes, status post splenectomy, recurrent acute on chronic pancreatitis secondary to alcohol consumption/alcohol abuse (manifested by dilated pancreas duct and possible pancreatic duct stone in neck body region as well as possible cysts), COPD, GERD, arthritis, colon polyps (SSPs).  The patient is seen today for evaluation and management of:  1. Pancreatic cyst   2. Dilation of pancreatic duct   3. Chronic pancreatitis, unspecified pancreatitis type ([place])   4. Alcohol use disorder, mild, in early remission, abuse   5. History of colonic polyps    The patient is hemodynamically and clinically well.  Most recent imaging is reviewed above shows a persistent near head of pancreas cyst as well as persistent and stable pancreatic duct dilation.  He is 6 months sober and has not had any recurrent pancreatitis develop.  His weight is coming up and he has no changes in his bowel habits to be concern for overt exocrine pancreatic insufficiency.  At this junction with the stability of good status and not significantly improving I do have some concern about the possibility of this being a IPMN or other cystic neoplasm.  The patient when I discussed the consideration of an EUS with fine-needle aspiration became very concerned about the possibility of developing  pancreatitis.  The pancreatic duct stricture presumed to be in the region of his subsequent proximal PD dilation does not look to be kinking from prior imaging.  He had PD dilation for years.  He had multiple imaging studies that have not shown any evidence of a pancreatic mass.  However, I do think that an EUS should be performed at some point this year that we can truly exclude any slight mass or lesion in the region of the pancreas.  I also discussed the consideration of doing a cyst aspiration but he is very hesitant about that because of the risk of pancreatitis.  As such we agreed because he does do well and has been gaining weight to proceed with a follow-up imaging study in approximately 3 months.  No matter what that showed we will proceed with an EUS and if the head of pancreas cyst has not significantly improved I would then strongly consider sampling that unless the patient does not want to take that particular risk.  I have asked him to continue on his great journey of alcohol for driving.  Of asked him to try and continue to work on cutting down his tobacco use.  He will let us know if anything else changes or his bowel habits change.  He is due for a follow-up colonoscopy within this year due to poor preparation but also findings of polyps during his procedure.  This has already been reviewed and should be rescheduled at his convenience this year.  All patient questions were answered, to the best  of my ability, and the patient agrees to the aforementioned plan of action with follow-up as indicated.   PLAN  Proceed with ordering a CT abdomen with IV/p.o. contrast to be performed in 3 months to follow-up pancreatic cystic lesion We will strongly consider an endoscopic ultrasound later this year Patient needs a colonoscopy for follow-up this year In future he has good anatomy that could consider the role of AP film however he seems to be doing well   Orders Placed This Encounter  Procedures    . CT ABDOMEN W CONTRAST  . Basic Metabolic Panel (BMET)    New Prescriptions   No medications on file   Modified Medications   No medications on file    Planned Follow Up: No follow-ups on file.   Justice Britain, MD [facility] Advanced Endoscopy Office # [phone]

## 2018-09-11 NOTE — Patient Instructions (Signed)
If you are age 67 or older, your body mass index should be between 23-30. Your Body mass index is 21.72 kg/m. If this is out of the aforementioned range listed, please consider follow up with your Primary Care Provider.  If you are age 19 or younger, your body mass index should be between 19-25. Your Body mass index is 21.72 kg/m. If this is out of the aformentioned range listed, please consider follow up with your Primary Care Provider.    You have been scheduled for a CT scan of the abdomen and pelvis at [facility] ([address]---this is in the same building as Press [facility]).    You are scheduled on 12/10/2018 at 8:30am. You should arrive 15 minutes prior to your appointment time for registration. Please follow the written instructions below on the day of your exam:  WARNING: IF YOU ARE ALLERGIC TO IODINE/X-RAY DYE, PLEASE NOTIFY RADIOLOGY IMMEDIATELY AT [phone]! YOU WILL BE GIVEN A 13 HOUR PREMEDICATION PREP.  1) Do not eat or drink anything after 4:30am  (4 hours prior to your test) 2) You have been given 2 bottles of oral contrast to drink. The solution may taste better if refrigerated, but do NOT add ice or any other liquid to this solution. Shake well before drinking.     Drink 1 bottle of contrast @ 7:30am (1 hour prior to your exam)  You may take any medications as prescribed with a small amount of water, if necessary. If you take any of the following medications: METFORMIN, GLUCOPHAGE, GLUCOVANCE, AVANDAMET, RIOMET, FORTAMET, [place] MET, JANUMET, GLUMETZA or METAGLIP, you MAY be asked to HOLD this medication 48 hours AFTER the exam.  The purpose of you drinking the oral contrast is to aid in the visualization of your intestinal tract. The contrast solution may cause some diarrhea. Depending on your individual set of symptoms, you may also receive an intravenous injection of x-ray contrast/dye. Plan on being at [facility] for 30 minutes or longer,  depending on the type of exam you are having performed.  This test typically takes 30-45 minutes to complete.  If you have any questions regarding your exam or if you need to reschedule, you may call the CT department at [phone] between the hours of 8:00 am and 5:00 pm, Monday-Friday.   You will need to have labs (BMET) 1 week prior to CT scan. Please come to lab in basement at our office.  ________________________________________________________________________   Thank you for choosing me and [facility].  Dr. Rush Landmark

## 2018-09-17 ENCOUNTER — Emergency Department (HOSPITAL_COMMUNITY)
Admission: EM | Admit: 2018-09-17 | Discharge: 2018-09-17 | Disposition: A | Discharge status: Home / Self Care | Payer: Medicare Other | Attending: Emergency Medicine | Admitting: Emergency Medicine

## 2018-09-17 ENCOUNTER — Emergency Department (HOSPITAL_COMMUNITY): Payer: Medicare Other

## 2018-09-17 ENCOUNTER — Other Ambulatory Visit: Payer: Self-pay

## 2018-09-17 ENCOUNTER — Encounter (HOSPITAL_COMMUNITY): Payer: Self-pay | Admitting: *Deleted

## 2018-09-17 DIAGNOSIS — F17210 Nicotine dependence, cigarettes, uncomplicated: Secondary | ICD-10-CM | POA: Insufficient documentation

## 2018-09-17 DIAGNOSIS — I509 Heart failure, unspecified: Secondary | ICD-10-CM | POA: Insufficient documentation

## 2018-09-17 DIAGNOSIS — I110 Hypertensive heart disease with heart failure: Secondary | ICD-10-CM | POA: Diagnosis not present

## 2018-09-17 DIAGNOSIS — R079 Chest pain, unspecified: Secondary | ICD-10-CM | POA: Diagnosis present

## 2018-09-17 DIAGNOSIS — R0789 Other chest pain: Secondary | ICD-10-CM | POA: Diagnosis not present

## 2018-09-17 DIAGNOSIS — E119 Type 2 diabetes mellitus without complications: Secondary | ICD-10-CM | POA: Insufficient documentation

## 2018-09-17 DIAGNOSIS — K859 Acute pancreatitis without necrosis or infection, unspecified: Secondary | ICD-10-CM

## 2018-09-17 DIAGNOSIS — R1010 Upper abdominal pain, unspecified: Secondary | ICD-10-CM | POA: Diagnosis not present

## 2018-09-17 DIAGNOSIS — J449 Chronic obstructive pulmonary disease, unspecified: Secondary | ICD-10-CM | POA: Diagnosis not present

## 2018-09-17 DIAGNOSIS — Z79899 Other long term (current) drug therapy: Secondary | ICD-10-CM | POA: Diagnosis not present

## 2018-09-17 LAB — COMPREHENSIVE METABOLIC PANEL
ALT: 12 U/L (ref 0–44)
AST: 19 U/L (ref 15–41)
Albumin: 3.7 g/dL (ref 3.5–5.0)
Alkaline Phosphatase: 66 U/L (ref 38–126)
Anion gap: 7 (ref 5–15)
BUN: 10 mg/dL (ref 8–23)
CO2: 28 mmol/L (ref 22–32)
Calcium: 9.3 mg/dL (ref 8.9–10.3)
Chloride: 105 mmol/L (ref 98–111)
Creatinine, Ser: 0.8 mg/dL (ref 0.61–1.24)
GFR calc Af Amer: >60 mL/min (ref >60)
GFR calc non Af Amer: >60 mL/min (ref >60)
Glucose, Bld: 153 mg/dL — ABNORMAL HIGH (ref 70–99)
Potassium: 4.1 mmol/L (ref 3.5–5.1)
Sodium: 140 mmol/L (ref 135–145)
Total Bilirubin: 0.4 mg/dL (ref 0.3–1.2)
Total Protein: 7.5 g/dL (ref 6.5–8.1)

## 2018-09-17 LAB — URINALYSIS, ROUTINE W REFLEX MICROSCOPIC
Bacteria, UA: NONE SEEN
Bilirubin Urine: NEGATIVE
Glucose, UA: NEGATIVE mg/dL
Ketones, ur: NEGATIVE mg/dL
Leukocytes,Ua: NEGATIVE
Nitrite: NEGATIVE
Protein, ur: NEGATIVE mg/dL
Specific Gravity, Urine: 1.008 (ref 1.005–1.030)
pH: 6 (ref 5.0–8.0)

## 2018-09-17 LAB — CBC
HCT: 44.4 % (ref 39.0–52.0)
Hemoglobin: 13.6 g/dL (ref 13.0–17.0)
MCH: 28.2 pg (ref 26.0–34.0)
MCHC: 30.6 g/dL (ref 30.0–36.0)
MCV: 92.1 fL (ref 80.0–100.0)
Platelets: 305 10*3/uL (ref 150–400)
RBC: 4.82 MIL/uL (ref 4.22–5.81)
RDW: 14.6 % (ref 11.5–15.5)
WBC: 8.8 10*3/uL (ref 4.0–10.5)
nRBC: 0 % (ref 0.0–0.2)

## 2018-09-17 LAB — LIPASE, BLOOD: Lipase: 213 U/L — ABNORMAL HIGH (ref 11–51)

## 2018-09-17 LAB — TROPONIN I: Troponin I: <0.03 ng/mL (ref <0.03)

## 2018-09-17 MED ORDER — LACTATED RINGERS IV BOLUS
1000.0000 mL | Freq: Once | INTRAVENOUS | Status: AC
  Administered 2018-09-17: 1000 mL via INTRAVENOUS

## 2018-09-17 MED ORDER — SODIUM CHLORIDE 0.9% FLUSH
3.0000 mL | Freq: Once | INTRAVENOUS | Status: AC
  Administered 2018-09-17: 3 mL via INTRAVENOUS

## 2018-09-17 MED ORDER — HYDROMORPHONE HCL 1 MG/ML IJ SOLN
1.0000 mg | Freq: Once | INTRAMUSCULAR | Status: AC
  Administered 2018-09-17: 1 mg via INTRAVENOUS
  Filled 2018-09-17: qty 1

## 2018-09-17 MED ORDER — HYDROCODONE-ACETAMINOPHEN 5-325 MG PO TABS: 1.0000 | ORAL_TABLET | ORAL | 0 refills | Status: DC | PRN

## 2018-09-17 NOTE — ED Triage Notes (Signed)
Pt reports chest tightness and epigastric pain since yesterday.  Hx of pancreatitis and states he has a tumor in his pancreas.  He took 2 [employer] with relief.  Cp recurred this am.

## 2018-09-17 NOTE — ED Provider Notes (Signed)
[facility] EMERGENCY DEPARTMENT Provider Note   CSN: [id number] Arrival date & time: 09/17/18  1756    History   Chief Complaint Chief Complaint  Patient presents with  . Chest Pain  . Abdominal Pain    HPI Timothy Martinez is a 68 y.o. male.     HPI  68 year old male presents with abdominal and chest pain.  Had some chest pain yesterday and took some nitroglycerin and then eventually vomited and felt much better.  Again today he develops more chest pain around noon that has gradually improved and now is developing upper abdominal pain typical of his pancreatitis.  He states often his pancreatitis will start in the chest and go to the abdomen like today.  The pain is currently about an 8 out of 10.  He has been encouraged to follow-up in the ED sooner rather than later for treatment of his pancreatitis.  No current vomiting, diarrhea, or fever.  Past Medical History:  Diagnosis Date  . Acute on chronic pancreatitis ([place]) 05/17/2016  . Alcohol abuse   . Alcoholic pancreatitis   . Anginal pain ([place])   . Anxiety and depression 01/20/2015   PHQ 9 = 11 (01/20/15)    . Arthritis    "eat up w/it" (05/17/2016)  . Atrial fibrillation status post cardioversion ([place]) 08/2014  . Atrial flutter, unspecified   . CHF (congestive heart failure) ([place])   . Chronic anticoagulation   . Chronic bronchitis ([place])   . COLD (chronic obstructive lung disease) ([place])   . Daily headache    "need eye examined" (05/17/2016)  . GERD (gastroesophageal reflux disease)   . Hematuria, microscopic 02/13/2015   Noted on UA - Recheck UA at next visit ~ 1 months   . Hyperlipidemia   . Hypertension   . Insomnia 02/11/2016  . Lateral epicondylitis of right elbow 03/17/2017  . Left foot pain 10/12/2013  . Low back pain 12/21/2012  . Malnutrition of moderate degree 05/18/2016  . Neck pain, bilateral posterior 10/15/2014  . Neuropathy 03/16/2015  . Pain of right upper extremity 05/30/2016  .  Pancreatic pseudocyst    seen on CT scan 07/2014  . Pancreatitis 08/04/2014  . Permanent atrial fibrillation 05/05/2017  . Pneumonia    "?a few times" (05/17/2016)  . 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 Tobacco abuse 05/30/2011   Heavy smoker up to 3 ppd down to 15 cigs per day in 09/2014   . Type II diabetes mellitus [facility])     Patient Active Problem List   Diagnosis Date Noted  . Pain of joint of left ankle and foot 06/13/2018  . History of colonic polyps 05/25/2018  . Colon cancer screening 04/16/2018  . Pancreatic cyst 04/16/2018  . Dilation of pancreatic duct 04/16/2018  . Hypokalemia 03/28/2018  . Hypomagnesemia 03/28/2018  . 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 ST segment depression 03/22/2018  . Protein-calorie malnutrition, severe 03/22/2018  . Other partial intestinal obstruction ([place])   . Atrial fibrillation with RVR ([place]) 03/21/2018  . Encounter for screening for malignant neoplasm of respiratory organs 03/20/2018  . Constipation   . Pancreatic pseudocyst 03/11/2018  . Blood in stool 02/19/2018  . Loss of weight 02/19/2018  . Declined smoking cessation 02/19/2018  . Acute on chronic pancreatitis ([place]) 10/31/2017  . Solitary pulmonary nodule 05/12/2017  . Pancreatitis, alcoholic, acute 05/10/5101  . Chronic atrial fibrillation   . On continuous oral anticoagulation 06/19/2015  . Chronic pancreatitis ([place]) 08/04/2014  . Chronic left-sided low  back pain with sciatica 12/21/2012  . COPD (chronic obstructive pulmonary disease) ([place]) 04/27/2012  . GERD (gastroesophageal reflux disease) 03/05/2012  . Alcohol use disorder, mild, in early remission, abuse 06/09/2011  . Diabetes mellitus ([place]) 05/30/2011  . Chronic alcoholic pancreatitis ([place]) 05/30/2011  . Hyperlipidemia 05/30/2011  . Tobacco abuse 05/30/2011    Past Surgical History:  Procedure Laterality Date  . BACK SURGERY    . CARDIOVERSION N/A 09/22/2014   Procedure: CARDIOVERSION;  Surgeon: Thayer Headings, MD;  Location: [facility]  ENDOSCOPY;  Service: Cardiovascular;  Laterality: N/A;  . NECK SURGERY  1974   not cervical, states had lesion on neck which was removed  . POSTERIOR LAMINECTOMY / DECOMPRESSION LUMBAR SPINE  10/2006   of L4, L5 and S1 with a 5-1 diskectomy, microdissection with the microscope/notes 10/24/2006  . SPLENECTOMY  2003        Home Medications    Prior to Admission medications   Medication Sig Start Date End Date Taking? Authorizing Provider  acetaminophen (TYLENOL) 325 MG tablet Take 2 tablets (650 mg total) by mouth every 6 (six) hours. 03/23/18   Meccariello, Bernita Raisin, DO  amitriptyline (ELAVIL) 10 MG tablet TAKE 1 TABLET BY MOUTH EVERYDAY AT BEDTIME 03/14/18   Lockamy, Timothy, DO  atorvastatin (LIPITOR) 40 MG tablet Take 1 tablet (40 mg total) by mouth daily at 6 PM. 02/19/18   Lockamy, Timothy, DO  Blood Glucose Monitoring Suppl (ONE TOUCH ULTRA 2) w/Device KIT Dispense one glucometer. Diagnosis Type 2 Diabetes. E11.9 06/13/18   Rory Percy, DO  cyanocobalamin 500 MCG tablet Take 500 mcg by mouth daily.    [provider]  diclofenac sodium (VOLTAREN) 1 % GEL Apply 4 g topically 4 (four) times daily. 06/13/18   Rory Percy, DO  gabapentin (NEURONTIN) 100 MG capsule TAKE 1 CAPSULE BY MOUTH THREE TIMES A DAY 03/14/18   Lockamy, Timothy, DO  glucose blood (ONE TOUCH ULTRA TEST) test strip Use check fasting blood glucose qAM. Type 2 Diabetes E11.9 06/13/18   Rory Percy, DO  HYDROcodone-acetaminophen (NORCO) 5-325 MG tablet Take 1 tablet by mouth every 4 (four) hours as needed for severe pain. 09/17/18   Sherwood Gambler, MD  metFORMIN (GLUCOPHAGE) 1000 MG tablet Take 1 tablet (1,000 mg total) by mouth 2 (two) times daily with a meal. 02/19/18   Lockamy, Timothy, DO  metoprolol tartrate (LOPRESSOR) 50 MG tablet Take 1.5 tablets (75 mg total) by mouth 2 (two) times daily. 02/19/18   [name], DO  mometasone-formoterol (DULERA) 200-5 MCG/ACT AERO Inhale 2 puffs into the lungs 2  (two) times daily. 05/28/18   [name], DO  nitroGLYCERIN (NITROSTAT) 0.4 MG SL tablet Place 1 tablet (0.4 mg total) under the tongue every 5 (five) minutes as needed for chest pain. 03/23/18 03/23/19  Meccariello, Bernita Raisin, DO  ondansetron (ZOFRAN) 4 MG tablet Take 1 tablet (4 mg total) by mouth every 8 (eight) hours as needed for nausea or vomiting. Patient taking differently: Take 4 mg by mouth as needed for nausea or vomiting.  03/20/18   [name], DO  ONE TOUCH LANCETS MISC Check fasting blood glucose qAM. Type 2 DM. E11.9 06/13/18   Rory Percy, DO  pantoprazole (PROTONIX) 40 MG tablet TAKE 1 TABLET BY MOUTH EVERY DAY 04/09/18   [name], DO  rivaroxaban (XARELTO) 20 MG TABS tablet Take 1 tablet (20 mg total) by mouth daily with supper. 02/19/18   [name], DO    Family History Family History  Problem  Relation Age of Onset  . Diabetes Mother   . Aneurysm Mother   . Stroke Mother   . Hypertension Mother   . Alcohol abuse Father   . Cancer Father        Lung  . Cancer Sister        Lung Cancer  . Post-traumatic stress disorder Brother   . Heart attack Neg Hx   . Colon cancer Neg Hx   . Liver cancer Neg Hx   . Rectal cancer Neg Hx   . Esophageal cancer Neg Hx   . Stomach cancer Neg Hx   . Inflammatory bowel disease Neg Hx     Social History Social History   Tobacco Use  . Smoking status: Current Every Day Smoker    Packs/day: 1.00    Years: 52.00    Pack years: 52.00    Types: Cigarettes    Start date: 04/09/1964  . Smokeless tobacco: Never Used  . Tobacco comment: down to one pack daily  Substance Use Topics  . Alcohol use: Not Currently  . Drug use: No     Allergies   Prozac [fluoxetine hcl]; Morphine and related; and Penicillins   Review of Systems Review of Systems  Respiratory: Positive for chest tightness. Negative for shortness of breath.   Cardiovascular: Positive for chest pain.  Gastrointestinal: Positive for abdominal  pain and vomiting (yesterday).  All other systems reviewed and are negative.    Physical Exam Updated Vital Signs BP 139/70   Pulse 69   Temp (!) 97.5 F (36.4 C) (Oral)   Resp (!) 26   SpO2 95%   Physical Exam Vitals signs and nursing note reviewed.  Constitutional:      General: He is not in acute distress.    Appearance: He is well-developed. He is not ill-appearing, toxic-appearing or diaphoretic.  HENT:     Head: Normocephalic and atraumatic.     Right Ear: External ear normal.     Left Ear: External ear normal.     Nose: Nose normal.  Eyes:     General:        Right eye: No discharge.        Left eye: No discharge.  Neck:     Musculoskeletal: Neck supple.  Cardiovascular:     Rate and Rhythm: Normal rate and regular rhythm.     Heart sounds: Normal heart sounds.  Pulmonary:     Effort: Pulmonary effort is normal.     Breath sounds: Normal breath sounds.  Abdominal:     Palpations: Abdomen is soft.     Tenderness: There is abdominal tenderness in the right upper quadrant, epigastric area and left upper quadrant.  Skin:    General: Skin is warm and dry.  Neurological:     Mental Status: He is alert.  Psychiatric:        Mood and Affect: Mood is not anxious.      ED Treatments / Results  Labs (all labs ordered are listed, but only abnormal results are displayed) Labs Reviewed  LIPASE, BLOOD - Abnormal; Notable for the following components:      Result Value   Lipase 213 (*)    All other components within normal limits  COMPREHENSIVE METABOLIC PANEL - Abnormal; Notable for the following components:   Glucose, Bld 153 (*)    All other components within normal limits  URINALYSIS, ROUTINE W REFLEX MICROSCOPIC - Abnormal; Notable for the following components:   Hgb urine dipstick  SMALL (*)    All other components within normal limits  CBC  TROPONIN I    EKG EKG Interpretation  Date/Time:  Monday September 17 2018 18:02:52 EST Ventricular Rate:   70 PR Interval:  138 QRS Duration: 86 QT Interval:  378 QTC Calculation: 408 R Axis:   81 Text Interpretation:  Normal sinus rhythm Right atrial enlargement Borderline ECG similar to aug 2019 Confirmed by Sherwood Gambler [phone]) on 09/17/2018 6:55:16 PM   Radiology Dg Chest 2 View  Result Date: 09/17/2018 CLINICAL DATA:  Abdominal pain, posterior chest pain, and shortness of breath for 3 days. History of diabetes, pneumonia, atrial fibrillation, hypertension, heart disease. Current smoker. EXAM: CHEST - 2 VIEW COMPARISON:  05/17/2016 FINDINGS: Emphysematous changes in the lungs. Central bronchial thickening consistent with chronic bronchitis. Normal heart size and pulmonary vascularity. Diffuse interstitial pattern to the lungs, similar to prior study likely representing interstitial fibrosis. No airspace disease or consolidation. No blunting of costophrenic angles. No pneumothorax. Mediastinal contours appear intact. Calcification of the aorta. IMPRESSION: 1. Emphysematous and chronic bronchitic changes in the lungs. 2. Chronic interstitial fibrosis in the lungs. 3. No evidence of active pulmonary disease. Electronically Signed   By: Lucienne Capers M.D.   On: 09/17/2018 20:11    Procedures Procedures (including critical care time)  Medications Ordered in ED Medications  sodium chloride flush (NS) 0.9 % injection 3 mL (3 mLs Intravenous Given 09/17/18 1931)  lactated ringers bolus 1,000 mL (0 mLs Intravenous Stopped 09/17/18 2108)  HYDROmorphone (DILAUDID) injection 1 mg (1 mg Intravenous Given 09/17/18 2007)     Initial Impression / Assessment and Plan / ED Course  I have reviewed the triage vital signs and the nursing notes.  Pertinent labs & imaging results that were available during my care of the patient were reviewed by me and considered in my medical decision making (see chart for details).        Patient is feeling much better.  His lipase is mildly elevated consistent with  his recurrent pancreatitis.  I think the chest pain is also related to the pancreatitis as he has had this many times before.  Troponin is negative after over 6 hours of symptoms.  ECG without acute ischemic changes.  At this point, he is feeling better and I think is reasonable to discharge him with a short course of hydrocodone and clear liquid diet.  Follow-up with PCP and/or GI.  Return precautions.  Final Clinical Impressions(s) / ED Diagnoses   Final diagnoses:  Acute recurrent pancreatitis    ED Discharge Orders         Ordered    HYDROcodone-acetaminophen (NORCO) 5-325 MG tablet  Every 4 hours PRN     09/17/18 2055           Sherwood Gambler, MD 09/17/18 2309

## 2018-09-17 NOTE — Discharge Instructions (Addendum)
If you develop worsening, continued, or recurrent abdominal pain, uncontrolled vomiting, fever, chest or back pain, or any other new/concerning symptoms then return to the ER for evaluation.  

## 2018-09-19 ENCOUNTER — Other Ambulatory Visit: Payer: Self-pay | Admitting: Pharmacist

## 2018-09-19 ENCOUNTER — Other Ambulatory Visit: Payer: Self-pay

## 2018-09-19 NOTE — Patient Outreach (Signed)
Timothy Martinez) Care Management  [place]   09/19/2018  Timothy Martinez 1951/01/03 [id number]  Reason for referral: Medication Assistance with Xarelto + inhaler  Referral source: [employer] Current insurance:[employer]   Outreach:  Successful telephone call with Timothy Martinez.  HIPAA identifiers verified.   Subjective:  Patient reports that he is having trouble paying for his "blood thinner and blue inhaler."  He reports the cost for each is ~$9 and that he has Extra Help through the [facility].  He has appt with PCP on Friday.    Objective: Lab Results  Component Value Date   CREATININE 0.80 09/17/2018   CREATININE 0.64 (L) 03/30/2018   CREATININE 0.91 03/28/2018    Lab Results  Component Value Date   HGBA1C 6.7 06/13/2018    Lipid Panel     Component Value Date/Time   CHOL 93 11/01/2017 0643   TRIG 77 11/01/2017 0643   HDL 37 (L) 11/01/2017 0643   CHOLHDL 2.5 11/01/2017 0643   VLDL 15 11/01/2017 0643   LDLCALC 41 11/01/2017 0643    BP Readings from Last 3 Encounters:  09/17/18 139/70  09/11/18 123/77  06/13/18 122/60    Allergies  Allergen Reactions  . Prozac [Fluoxetine Hcl] Nausea Only    Made the patient very sick to his stomach  . Morphine And Related Swelling    Pt states he can tolerate oxycodone and hydromorphone  . Penicillins Rash    Has patient had a PCN reaction causing immediate rash, facial/tongue/throat swelling, SOB or lightheadedness with hypotension: Yes Has patient had a PCN reaction causing severe rash involving mucus membranes or skin necrosis: No Has patient had a PCN reaction that required hospitalization: No Has patient had a PCN reaction occurring within the last 10 years: No If all of the above answers are "NO", then may proceed with Cephalosporin use.     Medications Reviewed Today    Reviewed by Mansouraty, Telford Nab., MD (Physician) on 09/11/18 at [place] List Status: <None>  Medication  Order Taking? Sig Documenting Provider Last Dose Status Informant  acetaminophen (TYLENOL) 325 MG tablet [id number] Yes Take 2 tablets (650 mg total) by mouth every 6 (six) hours. Meccariello, Bernita Raisin, DO Taking Active   amitriptyline (ELAVIL) 10 MG tablet [id number] Yes TAKE 1 TABLET BY MOUTH EVERYDAY AT BEDTIME [name], DO Taking Active Multiple Informants  atorvastatin (LIPITOR) 40 MG tablet [id number] Yes Take 1 tablet (40 mg total) by mouth daily at 6 PM. [name], DO Taking Active Multiple Informants  Blood Glucose Monitoring Suppl (ONE TOUCH ULTRA 2) w/Device KIT [id number] Yes Dispense one glucometer. Diagnosis Type 2 Diabetes. E11.9 Rory Percy, DO Taking Active   cyanocobalamin 500 MCG tablet [id number] Yes Take 500 mcg by mouth daily. [provider] Taking Active Multiple Informants  diclofenac sodium (VOLTAREN) 1 % GEL [id number] Yes Apply 4 g topically 4 (four) times daily. Rory Percy, DO Taking Active   gabapentin (NEURONTIN) 100 MG capsule [id number] Yes TAKE 1 CAPSULE BY MOUTH THREE TIMES A DAY [name], DO Taking Active Multiple Informants  glucose blood (ONE TOUCH ULTRA TEST) test strip [id number] Yes Use check fasting blood glucose qAM. Type 2 Diabetes E11.9 Rory Percy, DO Taking Active   metFORMIN (GLUCOPHAGE) 1000 MG tablet [id number] Yes Take 1 tablet (1,000 mg total) by mouth 2 (two) times daily with a meal. [name], DO Taking Active Multiple Informants  metoprolol tartrate (LOPRESSOR) 50 MG tablet [id number] Yes Take  1.5 tablets (75 mg total) by mouth 2 (two) times daily. [name], DO Taking Active Multiple Informants  mometasone-formoterol (DULERA) 200-5 MCG/ACT AERO [id number] Yes Inhale 2 puffs into the lungs 2 (two) times daily. [name], DO Taking Active   nitroGLYCERIN (NITROSTAT) 0.4 MG SL tablet [id number] Yes Place 1 tablet (0.4 mg total) under the tongue every 5 (five) minutes as needed for chest pain.  Meccariello, Bernita Raisin, DO Taking Active   ondansetron (ZOFRAN) 4 MG tablet [id number] Yes Take 1 tablet (4 mg total) by mouth every 8 (eight) hours as needed for nausea or vomiting.  Patient taking differently:  Take 4 mg by mouth as needed for nausea or vomiting.    [name], DO Taking Active Multiple Informants  ONE TOUCH LANCETS MISC [id number] Yes Check fasting blood glucose qAM. Type 2 DM. E11.9 Rory Percy, DO Taking Active   pantoprazole (PROTONIX) 40 MG tablet [id number] Yes TAKE 1 TABLET BY MOUTH EVERY DAY Lockamy, Timothy, DO Taking Active   rivaroxaban (XARELTO) 20 MG TABS tablet [id number] Yes Take 1 tablet (20 mg total) by mouth daily with supper. [name], DO Taking Active Multiple Informants           Medication Assistance Findings:    Extra Help:   '[x]'  Already receiving Full Extra Help  '[]'  Already receiving Partial Extra Help  '[]'  Eligible based on reported income and assets  '[]'  Not Eligible based on reported income and assets  Reviewed with patient that he is already receiving maximum patient assistance benefits.  Patient will not be approved for any further patient assistance programs as he already has Extra Help. He can ask PCP to write prescriptions for 90 day supply as $9 may be same for 30 or 90 day supply.  Patient voiced understanding and will discuss with PCP on Friday.    Plan: Will close [facility] pharmacy case as no further medication needs identified at this time.  Am happy to assist in the future as needed.     Ralene Bathe, PharmD, [place] [phone]

## 2018-09-19 NOTE — Patient Outreach (Signed)
[facility]) Care Management  09/19/2018  JONATHAN CORPUS 03-04-1951 [id number]   Telephone Screen  Referral Date: 09/19/2018 Referral Source: [facility]. Referral Reason: " [employer] member-would like co pay assistance for Xarelto, [name] and Proventil" Insurance: [employer]   Outreach attempt # 1 to patient. Spoke briefly with patient who reported he was currently not at home. RN CM discussed referral source and reason with patient.   He voices that he is on a very limited income. He reports that he is taking about eight different meds and all the co pays add up quickly which makes it hard for him to afford meds. He voices that Xarelto, Dulera and Proventil are the most expensive and costs him about $9.00. He states that it may not seem like much money but given his income that is a lot. He denies currently being out of any of his meds. Patient reports that his doctor is trying to get him off of Proventil and just have him take [facility] if his breathing would allow that. He denies any issues managing his meds. Patient voices that he is self sufficient and independent with all ADLs/IADLs. He lives alone and drives himself to MD appts. He denies any recent falls.  Conditions:Per chart review, patient has PMH that includes: pancreatitis, HLD,ETOH abuse, anxiety,depression, arthritis, A-fib, CHF,COPD, DM and GERD. He was in the ED on 09/17/2018 related to abd and chest pain. Patient attributes it to his pancreatitis. He denies any nursing needs at this time.   Consent:[facility] services reviewed and discussed with patient. Verbal consent for services given.      Plan: RN CM will send [facility] pharmacy referral for possible med assistance.   Enzo Montgomery, RN,BSN,CCM [place] Management Telephonic Care Management Coordinator Direct Phone: [phone] Toll Free: [phone] Fax: [phone]

## 2018-09-21 ENCOUNTER — Ambulatory Visit: Payer: Medicare Other | Admitting: Family Medicine

## 2018-09-21 ENCOUNTER — Telehealth: Payer: Self-pay | Admitting: Family Medicine

## 2018-09-21 NOTE — Telephone Encounter (Signed)
Pt called because his [name] is due to be refilled on Wednesday 09/26/2018. He would like to have this as a 90 day supply since going every month is very expensive and he would save money . This way he would only have to pay every 90 days instead of 30 days. jw

## 2018-09-24 ENCOUNTER — Other Ambulatory Visit: Payer: Self-pay | Admitting: Family Medicine

## 2018-09-25 ENCOUNTER — Other Ambulatory Visit: Payer: Self-pay | Admitting: Family Medicine

## 2018-09-25 ENCOUNTER — Other Ambulatory Visit: Payer: Self-pay

## 2018-09-25 ENCOUNTER — Telehealth: Payer: Self-pay | Admitting: Family Medicine

## 2018-09-25 NOTE — Telephone Encounter (Signed)
Called patient and informed of the limitation on Dulera inhaler per Dr. Garlan Fillers.  Patient says that all of his other medication is on a 90 day cycle.  Patient states that he spoke to his insurance company and they suggested that to save money he have his [facility] prescribed as a 90 day supply as well.  Patient does not understand why he can not get 3 inhalers at the same time.  Please advise.  Ozella Almond, [place]

## 2018-09-25 NOTE — Telephone Encounter (Signed)
Pt came in office to request refill of: Xarelto and Magnesium Oxide Tablet for Stomach  Name of Medication(s):  Xarelto and Magnesium Oxide Last date of OV:  06/13/2018 Pharmacy:  [facility].   Will route refill request to Clinic RN.  Discussed with patient policy to call pharmacy for future refills.  Also, discussed refills may take up to 48 hours to approve or deny.  Eldred Manges [place]

## 2018-09-27 ENCOUNTER — Other Ambulatory Visit: Payer: Self-pay | Admitting: Family Medicine

## 2018-09-27 MED ORDER — MOMETASONE FURO-FORMOTEROL FUM 200-5 MCG/ACT IN AERO: 2.0000 | INHALATION_SPRAY | Freq: Two times a day (BID) | RESPIRATORY_TRACT | 1 refills | Status: AC

## 2018-09-27 NOTE — Progress Notes (Signed)
Patient claims insurance says he can save money if he gets 3 inhalers at a time. Sending in prescription as requested by patient.

## 2018-10-01 ENCOUNTER — Ambulatory Visit (INDEPENDENT_AMBULATORY_CARE_PROVIDER_SITE_OTHER): Payer: Medicare Other | Admitting: Family Medicine

## 2018-10-01 DIAGNOSIS — E119 Type 2 diabetes mellitus without complications: Secondary | ICD-10-CM | POA: Diagnosis not present

## 2018-10-01 LAB — POCT GLYCOSYLATED HEMOGLOBIN (HGB A1C): HbA1c, POC (controlled diabetic range): 7.2 % — AB (ref 0.0–7.0)

## 2018-10-01 MED ORDER — PANTOPRAZOLE SODIUM 40 MG PO TBEC: 40.0000 mg | DELAYED_RELEASE_TABLET | Freq: Every day | ORAL | 3 refills | Status: AC

## 2018-10-01 MED ORDER — GLUCOSE BLOOD VI STRP: ORAL_STRIP | 3 refills | Status: AC

## 2018-10-01 MED ORDER — PANTOPRAZOLE SODIUM 40 MG PO TBEC: 40.0000 mg | DELAYED_RELEASE_TABLET | Freq: Every day | ORAL | 3 refills | Status: DC

## 2018-10-01 NOTE — Patient Instructions (Addendum)
It was great to see you again! I'm glad you are doing so well and you are going to all your appointments! Keep up the good work. The most important change you can make for your health is to quit smoking. Please let me know when you are ready to quit and if you want to start any medications that will help.  Today, we discussed your medications and I have refilled your pantoprazole. I am getting some basic labs and if they are abnormal I will call you.  Please continue to check your blood sugar in the mornings before meals. I will see you in 3 months time.  Be well, Harolyn Rutherford, DO PGY-2, [facility]

## 2018-10-01 NOTE — Progress Notes (Signed)
Subjective: Chief Complaint  Patient presents with  . ER follow up    Pancreatic attack     HPI: Timothy Martinez is a 68 y.o. presenting to clinic today to discuss the following:  T2DM Patient presents for follow up management of his T2DM. He has been eating regularly and is now gaining weight but is still watching his diet and avoiding sugary drinks and lots of snacks. He reports no complications of his diabetes such as neuropathy, changes in vision, polyuria, polydipsia, or polyphagia.  ED Visit F/u for Pancreatitis Patient was seen in the ED recently due to acute on chronic pancreatitis. Fortunately, he presented early and he was able to get IV fluids, anti-emetic medications, and support and was able to go home without requiring admission. He has been doing well since and is experiencing no abdominal pain today.   He denies chest pain, SOB, nausea, vomiting, or diarrhea.  Health Maintenance: none today     ROS noted in HPI.   Past Medical, Surgical, Social, and Family History Reviewed & Updated per EMR.   Pertinent Historical Findings include:   Social History   Tobacco Use  Smoking Status Current Every Day Smoker  . Packs/day: 1.00  . Years: 52.00  . Pack years: 52.00  . Types: Cigarettes  . Start date: 04/09/1964  Smokeless Tobacco Never Used  Tobacco Comment   down to one pack daily    Objective: BP 110/65   Pulse 67   Temp (!) 97.4 F (36.3 C) (Oral)   Wt 173 lb 9.6 oz (78.7 kg)   SpO2 97%   BMI 21.70 kg/m  Vitals and nursing notes reviewed  Physical Exam Gen: Alert and Oriented x 3, NAD HEENT: Normocephalic, atraumatic, PERRLA, EOMI, TM visible with good light reflex, non-swollen, non-erythematous turbinates, non-erythematous pharyngeal mucosa, no exudates Neck: trachea midline, no thyroidmegaly, no LAD CV: RRR, no murmurs, normal S1, S2 split Resp: CTAB, no wheezing, rales, or rhonchi, comfortable work of breathing Abd: non-distended,  non-tender, soft, +bs in all four quadrants MSK: Moves all four extremities Ext: no clubbing, cyanosis, or edema Neuro: No gross deficits Skin: warm, dry, intact, no rashes   Assessment/Plan:  Type 2 diabetes mellitus without complication (HCC) F8B slightly above goal at 7.2. I would like him to be 7.0 or lower. I do believe with slight diet modification he can get control. - Cont Metformin - Recheck in 3 months, if A1c still above goal consider SGLT-2 inhibitor as he does have heart disease but use with caution as he does have pancreatitis. - Renforced checking CBGs every morning and adherence to good diet and exercise.   Hypomagnesemia Patient continues taking supplements. - Recheck level today     PATIENT EDUCATION PROVIDED: See AVS    Diagnosis and plan along with any newly prescribed medication(s) were discussed in detail with this patient today. The patient verbalized understanding and agreed with the plan. Patient advised if symptoms worsen return to clinic or ER.   Health Maintainance:   Orders Placed This Encounter  Procedures  . Magnesium  . HgB A1c    Meds ordered this encounter  Medications  . DISCONTD: pantoprazole (PROTONIX) 40 MG tablet    Sig: Take 1 tablet (40 mg total) by mouth daily.    Dispense:  90 tablet    Refill:  3  . glucose blood (ONE TOUCH ULTRA TEST) test strip    Sig: Use check fasting blood glucose qAM. Type 2 Diabetes E11.9  Dispense:  100 each    Refill:  3  . pantoprazole (PROTONIX) 40 MG tablet    Sig: Take 1 tablet (40 mg total) by mouth daily.    Dispense:  90 tablet    Refill:  Kimberly, DO 10/01/2018, 3:00 PM PGY-2 [place]

## 2018-10-02 ENCOUNTER — Telehealth: Payer: Self-pay | Admitting: *Deleted

## 2018-10-02 ENCOUNTER — Other Ambulatory Visit: Payer: Self-pay | Admitting: Family Medicine

## 2018-10-02 LAB — MAGNESIUM: Magnesium: 1.8 mg/dL (ref 1.6–2.3)

## 2018-10-02 MED ORDER — ONETOUCH ULTRASOFT LANCETS MISC: 12 refills | Status: AC

## 2018-10-02 MED ORDER — FREESTYLE LANCETS MISC: 12 refills | Status: AC

## 2018-10-02 NOTE — Progress Notes (Signed)
Refilled lancets for patient

## 2018-10-02 NOTE — Telephone Encounter (Signed)
Patient states that meter he has is One touch ultra 2.  He states that he was supposed to call and let Dr. Garlan Fillers so that he could send send in lancets. Ewan Grau, Salome Spotted, CMA

## 2018-10-11 NOTE — Assessment & Plan Note (Signed)
A1c slightly above goal at 7.2. I would like him to be 7.0 or lower. I do believe with slight diet modification he can get control. - Cont Metformin - Recheck in 3 months, if A1c still above goal consider SGLT-2 inhibitor as he does have heart disease but use with caution as he does have pancreatitis. - Renforced checking CBGs every morning and adherence to good diet and exercise.

## 2018-10-11 NOTE — Assessment & Plan Note (Signed)
Patient continues taking supplements. - Recheck level today

## 2018-10-24 DIAGNOSIS — K311 Adult hypertrophic pyloric stenosis: Secondary | ICD-10-CM

## 2018-10-24 HISTORY — DX: Adult hypertrophic pyloric stenosis: K31.1

## 2018-11-13 ENCOUNTER — Other Ambulatory Visit: Payer: Self-pay | Admitting: Family Medicine

## 2018-11-16 ENCOUNTER — Other Ambulatory Visit: Payer: Self-pay | Admitting: Family Medicine

## 2018-11-18 ENCOUNTER — Emergency Department (HOSPITAL_COMMUNITY): Payer: Medicare Other

## 2018-11-18 ENCOUNTER — Encounter (HOSPITAL_COMMUNITY): Payer: Self-pay | Admitting: Emergency Medicine

## 2018-11-18 ENCOUNTER — Other Ambulatory Visit: Payer: Self-pay

## 2018-11-18 ENCOUNTER — Inpatient Hospital Stay (HOSPITAL_COMMUNITY): Payer: Medicare Other

## 2018-11-18 ENCOUNTER — Inpatient Hospital Stay (HOSPITAL_COMMUNITY)
Admission: EM | Admit: 2018-11-18 | Discharge: 2018-11-23 | DRG: 439 | Disposition: A | Discharge status: Home / Self Care | Payer: Medicare Other | Attending: Family Medicine | Admitting: Family Medicine

## 2018-11-18 DIAGNOSIS — K311 Adult hypertrophic pyloric stenosis: Secondary | ICD-10-CM | POA: Diagnosis present

## 2018-11-18 DIAGNOSIS — J439 Emphysema, unspecified: Secondary | ICD-10-CM

## 2018-11-18 DIAGNOSIS — I110 Hypertensive heart disease with heart failure: Secondary | ICD-10-CM | POA: Diagnosis present

## 2018-11-18 DIAGNOSIS — K2980 Duodenitis without bleeding: Secondary | ICD-10-CM | POA: Diagnosis not present

## 2018-11-18 DIAGNOSIS — E785 Hyperlipidemia, unspecified: Secondary | ICD-10-CM | POA: Diagnosis present

## 2018-11-18 DIAGNOSIS — Z79899 Other long term (current) drug therapy: Secondary | ICD-10-CM | POA: Diagnosis not present

## 2018-11-18 DIAGNOSIS — I5030 Unspecified diastolic (congestive) heart failure: Secondary | ICD-10-CM | POA: Diagnosis present

## 2018-11-18 DIAGNOSIS — E119 Type 2 diabetes mellitus without complications: Secondary | ICD-10-CM

## 2018-11-18 DIAGNOSIS — K259 Gastric ulcer, unspecified as acute or chronic, without hemorrhage or perforation: Secondary | ICD-10-CM | POA: Diagnosis present

## 2018-11-18 DIAGNOSIS — F419 Anxiety disorder, unspecified: Secondary | ICD-10-CM | POA: Diagnosis present

## 2018-11-18 DIAGNOSIS — F329 Major depressive disorder, single episode, unspecified: Secondary | ICD-10-CM | POA: Diagnosis present

## 2018-11-18 DIAGNOSIS — Z8249 Family history of ischemic heart disease and other diseases of the circulatory system: Secondary | ICD-10-CM

## 2018-11-18 DIAGNOSIS — Z888 Allergy status to other drugs, medicaments and biological substances status: Secondary | ICD-10-CM

## 2018-11-18 DIAGNOSIS — F1010 Alcohol abuse, uncomplicated: Secondary | ICD-10-CM | POA: Diagnosis present

## 2018-11-18 DIAGNOSIS — K8681 Exocrine pancreatic insufficiency: Secondary | ICD-10-CM | POA: Diagnosis present

## 2018-11-18 DIAGNOSIS — K5669 Other partial intestinal obstruction: Secondary | ICD-10-CM | POA: Diagnosis not present

## 2018-11-18 DIAGNOSIS — Z86010 Personal history of colonic polyps: Secondary | ICD-10-CM

## 2018-11-18 DIAGNOSIS — R1010 Upper abdominal pain, unspecified: Secondary | ICD-10-CM | POA: Diagnosis not present

## 2018-11-18 DIAGNOSIS — M545 Low back pain: Secondary | ICD-10-CM | POA: Diagnosis present

## 2018-11-18 DIAGNOSIS — E860 Dehydration: Secondary | ICD-10-CM

## 2018-11-18 DIAGNOSIS — K860 Alcohol-induced chronic pancreatitis: Secondary | ICD-10-CM | POA: Diagnosis present

## 2018-11-18 DIAGNOSIS — I4891 Unspecified atrial fibrillation: Secondary | ICD-10-CM | POA: Diagnosis not present

## 2018-11-18 DIAGNOSIS — G4700 Insomnia, unspecified: Secondary | ICD-10-CM | POA: Diagnosis present

## 2018-11-18 DIAGNOSIS — K209 Esophagitis, unspecified without bleeding: Secondary | ICD-10-CM

## 2018-11-18 DIAGNOSIS — K863 Pseudocyst of pancreas: Secondary | ICD-10-CM

## 2018-11-18 DIAGNOSIS — K859 Acute pancreatitis without necrosis or infection, unspecified: Secondary | ICD-10-CM | POA: Diagnosis not present

## 2018-11-18 DIAGNOSIS — K3189 Other diseases of stomach and duodenum: Secondary | ICD-10-CM | POA: Diagnosis not present

## 2018-11-18 DIAGNOSIS — K210 Gastro-esophageal reflux disease with esophagitis: Secondary | ICD-10-CM | POA: Diagnosis present

## 2018-11-18 DIAGNOSIS — R109 Unspecified abdominal pain: Secondary | ICD-10-CM

## 2018-11-18 DIAGNOSIS — I10 Essential (primary) hypertension: Secondary | ICD-10-CM | POA: Diagnosis not present

## 2018-11-18 DIAGNOSIS — K862 Cyst of pancreas: Secondary | ICD-10-CM

## 2018-11-18 DIAGNOSIS — E11649 Type 2 diabetes mellitus with hypoglycemia without coma: Secondary | ICD-10-CM | POA: Diagnosis not present

## 2018-11-18 DIAGNOSIS — F17210 Nicotine dependence, cigarettes, uncomplicated: Secondary | ICD-10-CM | POA: Diagnosis present

## 2018-11-18 DIAGNOSIS — R918 Other nonspecific abnormal finding of lung field: Secondary | ICD-10-CM | POA: Diagnosis not present

## 2018-11-18 DIAGNOSIS — Z7901 Long term (current) use of anticoagulants: Secondary | ICD-10-CM

## 2018-11-18 DIAGNOSIS — R197 Diarrhea, unspecified: Secondary | ICD-10-CM | POA: Diagnosis not present

## 2018-11-18 DIAGNOSIS — R935 Abnormal findings on diagnostic imaging of other abdominal regions, including retroperitoneum: Secondary | ICD-10-CM | POA: Diagnosis not present

## 2018-11-18 DIAGNOSIS — Z885 Allergy status to narcotic agent status: Secondary | ICD-10-CM

## 2018-11-18 DIAGNOSIS — M1990 Unspecified osteoarthritis, unspecified site: Secondary | ICD-10-CM | POA: Diagnosis present

## 2018-11-18 DIAGNOSIS — K8689 Other specified diseases of pancreas: Secondary | ICD-10-CM | POA: Diagnosis present

## 2018-11-18 DIAGNOSIS — Z9081 Acquired absence of spleen: Secondary | ICD-10-CM

## 2018-11-18 DIAGNOSIS — I4821 Permanent atrial fibrillation: Secondary | ICD-10-CM | POA: Diagnosis present

## 2018-11-18 DIAGNOSIS — K315 Obstruction of duodenum: Secondary | ICD-10-CM | POA: Diagnosis present

## 2018-11-18 DIAGNOSIS — Z8507 Personal history of malignant neoplasm of pancreas: Secondary | ICD-10-CM

## 2018-11-18 DIAGNOSIS — E43 Unspecified severe protein-calorie malnutrition: Secondary | ICD-10-CM | POA: Diagnosis not present

## 2018-11-18 DIAGNOSIS — Z7984 Long term (current) use of oral hypoglycemic drugs: Secondary | ICD-10-CM

## 2018-11-18 DIAGNOSIS — G8929 Other chronic pain: Secondary | ICD-10-CM | POA: Diagnosis present

## 2018-11-18 DIAGNOSIS — T182XXA Foreign body in stomach, initial encounter: Secondary | ICD-10-CM | POA: Diagnosis not present

## 2018-11-18 DIAGNOSIS — Z4682 Encounter for fitting and adjustment of non-vascular catheter: Secondary | ICD-10-CM | POA: Diagnosis not present

## 2018-11-18 DIAGNOSIS — J449 Chronic obstructive pulmonary disease, unspecified: Secondary | ICD-10-CM | POA: Diagnosis present

## 2018-11-18 DIAGNOSIS — Z880 Allergy status to penicillin: Secondary | ICD-10-CM

## 2018-11-18 DIAGNOSIS — E861 Hypovolemia: Secondary | ICD-10-CM | POA: Diagnosis present

## 2018-11-18 HISTORY — DX: Pain in left ankle and joints of left foot: M25.572

## 2018-11-18 HISTORY — DX: Other chronic pain: G89.29

## 2018-11-18 HISTORY — DX: Chronic atrial fibrillation, unspecified: I48.20

## 2018-11-18 HISTORY — DX: Adult hypertrophic pyloric stenosis: K31.1

## 2018-11-18 HISTORY — DX: Abnormal weight loss: R63.4

## 2018-11-18 HISTORY — DX: Lumbago with sciatica, unspecified side: M54.40

## 2018-11-18 LAB — URINALYSIS, ROUTINE W REFLEX MICROSCOPIC
Bacteria, UA: NONE SEEN
Bilirubin Urine: NEGATIVE
Glucose, UA: NEGATIVE mg/dL
Ketones, ur: 5 mg/dL — AB
Leukocytes,Ua: NEGATIVE
Nitrite: NEGATIVE
Protein, ur: 30 mg/dL — AB
Specific Gravity, Urine: >1.046 — ABNORMAL HIGH (ref 1.005–1.030)
pH: 5 (ref 5.0–8.0)

## 2018-11-18 LAB — COMPREHENSIVE METABOLIC PANEL
ALT: 30 U/L (ref 0–44)
AST: 28 U/L (ref 15–41)
Albumin: 4.2 g/dL (ref 3.5–5.0)
Alkaline Phosphatase: 93 U/L (ref 38–126)
Anion gap: 11 (ref 5–15)
BUN: 16 mg/dL (ref 8–23)
CO2: 26 mmol/L (ref 22–32)
Calcium: 10 mg/dL (ref 8.9–10.3)
Chloride: 100 mmol/L (ref 98–111)
Creatinine, Ser: 0.88 mg/dL (ref 0.61–1.24)
GFR calc Af Amer: >60 mL/min (ref >60)
GFR calc non Af Amer: >60 mL/min (ref >60)
Glucose, Bld: 119 mg/dL — ABNORMAL HIGH (ref 70–99)
Potassium: 3.9 mmol/L (ref 3.5–5.1)
Sodium: 137 mmol/L (ref 135–145)
Total Bilirubin: 0.9 mg/dL (ref 0.3–1.2)
Total Protein: 8.6 g/dL — ABNORMAL HIGH (ref 6.5–8.1)

## 2018-11-18 LAB — CBC
HCT: 48.8 % (ref 39.0–52.0)
Hemoglobin: 15.7 g/dL (ref 13.0–17.0)
MCH: 29.5 pg (ref 26.0–34.0)
MCHC: 32.2 g/dL (ref 30.0–36.0)
MCV: 91.7 fL (ref 80.0–100.0)
Platelets: 335 10*3/uL (ref 150–400)
RBC: 5.32 MIL/uL (ref 4.22–5.81)
RDW: 14.6 % (ref 11.5–15.5)
WBC: 9.9 10*3/uL (ref 4.0–10.5)
nRBC: 0 % (ref 0.0–0.2)

## 2018-11-18 LAB — TROPONIN I
Troponin I: <0.03 ng/mL (ref <0.03)
Troponin I: <0.03 ng/mL (ref <0.03)

## 2018-11-18 LAB — LIPID PANEL
Cholesterol: 69 mg/dL (ref 0–200)
HDL: 26 mg/dL — ABNORMAL LOW (ref >40)
LDL Cholesterol: 27 mg/dL (ref 0–99)
Total CHOL/HDL Ratio: 2.7 RATIO
Triglycerides: 78 mg/dL (ref <150)
VLDL: 16 mg/dL (ref 0–40)

## 2018-11-18 LAB — PHOSPHORUS: Phosphorus: 3.8 mg/dL (ref 2.5–4.6)

## 2018-11-18 LAB — LIPASE, BLOOD: Lipase: 129 U/L — ABNORMAL HIGH (ref 11–51)

## 2018-11-18 LAB — GLUCOSE, CAPILLARY
Glucose-Capillary: 71 mg/dL (ref 70–99)
Glucose-Capillary: 64 mg/dL — ABNORMAL LOW (ref 70–99)

## 2018-11-18 LAB — MAGNESIUM: Magnesium: 1.4 mg/dL — ABNORMAL LOW (ref 1.7–2.4)

## 2018-11-18 MED ORDER — PANTOPRAZOLE SODIUM 40 MG IV SOLR
40.0000 mg | INTRAVENOUS | Status: DC
  Administered 2018-11-18 – 2018-11-22 (×5): 40 mg via INTRAVENOUS
  Filled 2018-11-18 (×5): qty 40

## 2018-11-18 MED ORDER — HYDROMORPHONE HCL 1 MG/ML IJ SOLN
1.0000 mg | INTRAMUSCULAR | Status: DC | PRN
  Administered 2018-11-18 – 2018-11-20 (×9): 1 mg via INTRAVENOUS
  Filled 2018-11-18 (×9): qty 1

## 2018-11-18 MED ORDER — HYDROMORPHONE HCL 1 MG/ML IJ SOLN
1.0000 mg | Freq: Once | INTRAMUSCULAR | Status: AC
  Administered 2018-11-18: 1 mg via INTRAVENOUS
  Filled 2018-11-18: qty 1

## 2018-11-18 MED ORDER — NITROGLYCERIN 0.4 MG SL SUBL: 0.4000 mg | SUBLINGUAL_TABLET | SUBLINGUAL | Status: DC | PRN

## 2018-11-18 MED ORDER — METOPROLOL TARTRATE 5 MG/5ML IV SOLN
10.0000 mg | Freq: Four times a day (QID) | INTRAVENOUS | Status: DC
  Administered 2018-11-18 – 2018-11-23 (×17): 10 mg via INTRAVENOUS
  Filled 2018-11-18 (×18): qty 10

## 2018-11-18 MED ORDER — MOMETASONE FURO-FORMOTEROL FUM 200-5 MCG/ACT IN AERO
2.0000 | INHALATION_SPRAY | Freq: Two times a day (BID) | RESPIRATORY_TRACT | Status: DC
  Administered 2018-11-19 – 2018-11-23 (×6): 2 via RESPIRATORY_TRACT
  Filled 2018-11-18 (×2): qty 8.8

## 2018-11-18 MED ORDER — SODIUM CHLORIDE 0.9 % IV BOLUS
1000.0000 mL | Freq: Once | INTRAVENOUS | Status: AC
  Administered 2018-11-18: 1000 mL via INTRAVENOUS

## 2018-11-18 MED ORDER — INSULIN ASPART 100 UNIT/ML ~~LOC~~ SOLN
0.0000 [IU] | Freq: Three times a day (TID) | SUBCUTANEOUS | Status: DC
  Administered 2018-11-20: 1 [IU] via SUBCUTANEOUS
  Administered 2018-11-21: 3 [IU] via SUBCUTANEOUS
  Administered 2018-11-21: 2 [IU] via SUBCUTANEOUS
  Administered 2018-11-22: 1 [IU] via SUBCUTANEOUS
  Administered 2018-11-22: 5 [IU] via SUBCUTANEOUS
  Administered 2018-11-22: 1 [IU] via SUBCUTANEOUS
  Administered 2018-11-23 (×2): 3 [IU] via SUBCUTANEOUS

## 2018-11-18 MED ORDER — SODIUM CHLORIDE 0.9 % IV SOLN
INTRAVENOUS | Status: DC
  Administered 2018-11-18: 16:00:00 via INTRAVENOUS

## 2018-11-18 MED ORDER — HEPARIN (PORCINE) 25000 UT/250ML-% IV SOLN
1100.0000 [IU]/h | INTRAVENOUS | Status: DC
  Administered 2018-11-19: 1100 [IU]/h via INTRAVENOUS
  Filled 2018-11-18: qty 250

## 2018-11-18 MED ORDER — ONDANSETRON HCL 4 MG/2ML IJ SOLN
4.0000 mg | Freq: Once | INTRAMUSCULAR | Status: AC
  Administered 2018-11-18: 4 mg via INTRAVENOUS
  Filled 2018-11-18: qty 2

## 2018-11-18 MED ORDER — IOHEXOL 300 MG/ML  SOLN
100.0000 mL | Freq: Once | INTRAMUSCULAR | Status: AC | PRN
  Administered 2018-11-18: 100 mL via INTRAVENOUS

## 2018-11-18 NOTE — ED Triage Notes (Signed)
Pt reports that he has had epigastric pains, nausea, vomiting, and chest pain since last Friday, 11/09/2018. Pt reports the chest pain eased off after taking a nitro but he has tried everything under the sun to help with everything else and nothing is working. Pt reports he could not get in touch with his PCP and his daughter who is a nurse told him to come to the ED. Pt reports having a history of pancreatitis with a cyst on same right now.

## 2018-11-18 NOTE — ED Provider Notes (Signed)
[facility] EMERGENCY DEPARTMENT Provider Note   CSN: [id number] Arrival date & time: 11/18/18  1047    History   Chief Complaint Chief Complaint  Patient presents with   Abdominal Pain   Chest Pain   Nausea   Emesis    HPI Timothy Martinez is a 68 y.o. male.     HPI Pt reports 1 week of worsening abd pain, nausea vomiting and inability to keep food or fluids down. Hx of chronic pancreatitis with known pseudocyst. Recently saw GI and has an outpatient endoscopy and CT scan pending. Symptoms worsened this week.  Water bowel movements all week. No fevers. No CP or cough. No longer drinks ETOH. Symptoms are moderate in severiity. Came to the ER with the encouragement of his daughter, a nurse at [facility]   Past Medical History:  Diagnosis Date   Acute on chronic pancreatitis ([place]) 05/17/2016   Alcohol abuse    Alcoholic pancreatitis    Anginal pain ([place])    Anxiety and depression 01/20/2015   PHQ 9 = 11 (01/20/15)     Arthritis    "eat up w/it" (05/17/2016)   Atrial fibrillation status post cardioversion ([place]) 08/2014   Atrial flutter, unspecified    CHF (congestive heart failure) (HCC)    Chronic anticoagulation    Chronic bronchitis (HCC)    COLD (chronic obstructive lung disease) ([place])    Daily headache    "need eye examined" (05/17/2016)   GERD (gastroesophageal reflux disease)    Hematuria, microscopic 02/13/2015   Noted on UA - Recheck UA at next visit ~ 1 months    Hyperlipidemia    Hypertension    Insomnia 02/11/2016   Lateral epicondylitis of right elbow 03/17/2017   Left foot pain 10/12/2013   Low back pain 12/21/2012   Malnutrition of moderate degree 05/18/2016   Neck pain, bilateral posterior 10/15/2014   Neuropathy 03/16/2015   Pain of right upper extremity 05/30/2016   Pancreatic pseudocyst    seen on CT scan 07/2014   Pancreatitis 08/04/2014   Permanent atrial fibrillation 05/05/2017   Pneumonia    "?a few  times" (05/17/2016)   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   Tobacco abuse 05/30/2011   Heavy smoker up to 3 ppd down to 15 cigs per day in 09/2014    Type II diabetes mellitus [facility])     Patient Active Problem List   Diagnosis Date Noted   Pain of joint of left ankle and foot 06/13/2018   History of colonic polyps 05/25/2018   Colon cancer screening 04/16/2018   Pancreatic cyst 04/16/2018   Dilation of pancreatic duct 04/16/2018   Hypokalemia 03/28/2018   Hypomagnesemia 03/28/2018   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   ST segment depression 03/22/2018   Protein-calorie malnutrition, severe 03/22/2018   Other partial intestinal obstruction (HCC)    Atrial fibrillation with RVR ([place]) 03/21/2018   Encounter for screening for malignant neoplasm of respiratory organs 03/20/2018   Constipation    Pancreatic pseudocyst 03/11/2018   Blood in stool 02/19/2018   Loss of weight 02/19/2018   Declined smoking cessation 02/19/2018   Acute on chronic pancreatitis ([place]) 10/31/2017   Solitary pulmonary nodule 05/12/2017   Pancreatitis, alcoholic, acute 92/33/0076   Chronic atrial fibrillation    On continuous oral anticoagulation 06/19/2015   Type 2 diabetes mellitus without complication ([place])    Chronic pancreatitis ([place]) 08/04/2014   Chronic left-sided low back pain with sciatica 12/21/2012   COPD (chronic obstructive pulmonary disease) ([place]) 04/27/2012  GERD (gastroesophageal reflux disease) 03/05/2012   Alcohol use disorder, mild, in early remission, abuse 06/09/2011   Diabetes mellitus ([place]) 05/30/2011   Chronic alcoholic pancreatitis ([place]) 05/30/2011   Hyperlipidemia 05/30/2011   Tobacco abuse 05/30/2011    Past Surgical History:  Procedure Laterality Date   BACK SURGERY     CARDIOVERSION N/A 09/22/2014   Procedure: CARDIOVERSION;  Surgeon: Thayer Headings, MD;  Location: [facility];  Service: Cardiovascular;  Laterality: N/A;   [place]   not  cervical, states had lesion on neck which was removed   POSTERIOR LAMINECTOMY / [place]  10/2006   of L4, L5 and S1 with a 5-1 diskectomy, microdissection with the microscope/notes 10/24/2006   SPLENECTOMY  2003        Home Medications    Prior to Admission medications   Medication Sig Start Date End Date Taking? Authorizing Provider  acetaminophen (TYLENOL) 325 MG tablet Take 2 tablets (650 mg total) by mouth every 6 (six) hours. 03/23/18  Yes Meccariello, Bernita Raisin, DO  amitriptyline (ELAVIL) 10 MG tablet TAKE 1 TABLET BY MOUTH EVERYDAY AT BEDTIME Patient taking differently: Take 10 mg by mouth at bedtime.  03/14/18  Yes Lockamy, Timothy, DO  atorvastatin (LIPITOR) 40 MG tablet Take 1 tablet (40 mg total) by mouth daily at 6 PM. 02/19/18  Yes Lockamy, Timothy, DO  cyanocobalamin 500 MCG tablet Take 500 mcg by mouth daily.   Yes [provider]  gabapentin (NEURONTIN) 100 MG capsule TAKE 1 CAPSULE BY MOUTH THREE TIMES A DAY Patient taking differently: Take 100 mg by mouth 3 (three) times daily.  03/14/18  Yes Lockamy, Timothy, DO  magnesium oxide (MAG-OX) 400 MG tablet TAKE 1 TABLET BY MOUTH EVERY DAY Patient taking differently: Take 400 mg by mouth daily.  09/25/18  Yes [name], DO  metFORMIN (GLUCOPHAGE) 1000 MG tablet Take 1 tablet (1,000 mg total) by mouth 2 (two) times daily with a meal. 02/19/18  Yes Lockamy, Timothy, DO  metoprolol tartrate (LOPRESSOR) 50 MG tablet Take 1.5 tablets (75 mg total) by mouth 2 (two) times daily. 02/19/18  Yes Lockamy, Timothy, DO  mometasone-formoterol (DULERA) 200-5 MCG/ACT AERO Inhale 2 puffs into the lungs 2 (two) times daily. 09/27/18 12/26/18 Yes Lockamy, Timothy, DO  nitroGLYCERIN (NITROSTAT) 0.4 MG SL tablet Place 1 tablet (0.4 mg total) under the tongue every 5 (five) minutes as needed for chest pain. 03/23/18 03/23/19 Yes Meccariello, Bernita Raisin, DO  pantoprazole (PROTONIX) 40 MG tablet Take 1 tablet (40 mg total) by mouth  daily. 10/01/18  Yes Lockamy, Timothy, DO  polyethylene glycol (MIRALAX / GLYCOLAX) 17 g packet TAKE 17 G BY MOUTH DAILY. 11/14/18  Yes Lockamy, Timothy, DO  XARELTO 20 MG TABS tablet TAKE 1 TABLET BY MOUTH DAILY WITH SUPPER Patient taking differently: Take 20 mg by mouth 2 (two) times a day.  09/25/18  Yes Lockamy, Timothy, DO  Blood Glucose Monitoring Suppl (ONE TOUCH ULTRA 2) w/Device KIT Dispense one glucometer. Diagnosis Type 2 Diabetes. E11.9 06/13/18   Rory Percy, DO  diclofenac sodium (VOLTAREN) 1 % GEL Apply 4 g topically 4 (four) times daily. Patient not taking: Reported on 11/18/2018 06/13/18   Rory Percy, DO  glucose blood (ONE TOUCH ULTRA TEST) test strip Use check fasting blood glucose qAM. Type 2 Diabetes E11.9 10/01/18   [name], DO  HYDROcodone-acetaminophen (NORCO) 5-325 MG tablet Take 1 tablet by mouth every 4 (four) hours as needed for severe pain. Patient not taking:  Reported on 11/18/2018 09/17/18   Sherwood Gambler, MD  Lancets (FREESTYLE) lancets Use as instructed 10/02/18   [name], DO  Lancets (ONETOUCH ULTRASOFT) lancets Use as instructed 10/02/18   [name], DO  ondansetron (ZOFRAN) 4 MG tablet Take 1 tablet (4 mg total) by mouth every 8 (eight) hours as needed for nausea or vomiting. Patient not taking: Reported on 11/18/2018 03/20/18   [name], DO    Family History Family History  Problem Relation Age of Onset   Diabetes Mother    Aneurysm Mother    Stroke Mother    Hypertension Mother    Alcohol abuse Father    Cancer Father        Lung   Cancer Sister        Lung Cancer   Post-traumatic stress disorder Brother    Heart attack Neg Hx    Colon cancer Neg Hx    Liver cancer Neg Hx    Rectal cancer Neg Hx    Esophageal cancer Neg Hx    Stomach cancer Neg Hx    Inflammatory bowel disease Neg Hx     Social History Social History   Tobacco Use   Smoking status: Current Every Day Smoker    Packs/day: 1.00     Years: 52.00    Pack years: 52.00    Types: Cigarettes    Start date: 04/09/1964   Smokeless tobacco: Never Used   Tobacco comment: down to one pack daily  Substance Use Topics   Alcohol use: Not Currently   Drug use: No     Allergies   Prozac [fluoxetine hcl]; Morphine and related; and Penicillins   Review of Systems Review of Systems  All other systems reviewed and are negative.    Physical Exam Updated Vital Signs BP 132/72    Pulse (!) 59    Temp 97.8 F (36.6 C) (Oral)    Resp (!) 21    Ht _0  (1.905 m)    Wt 78.9 kg    SpO2 95%    BMI 21.75 kg/m   Physical Exam Vitals signs and nursing note reviewed.  Constitutional:      Appearance: He is well-developed.  HENT:     Head: Normocephalic and atraumatic.  Neck:     Musculoskeletal: Normal range of motion.  Cardiovascular:     Rate and Rhythm: Normal rate and regular rhythm.     Heart sounds: Normal heart sounds.  Pulmonary:     Effort: Pulmonary effort is normal. No respiratory distress.     Breath sounds: Normal breath sounds.  Abdominal:     General: There is no distension.     Palpations: Abdomen is soft.     Tenderness: There is abdominal tenderness.  Musculoskeletal: Normal range of motion.  Skin:    General: Skin is warm and dry.  Neurological:     Mental Status: He is alert and oriented to person, place, and time.  Psychiatric:        Judgment: Judgment normal.      ED Treatments / Results  Labs (all labs ordered are listed, but only abnormal results are displayed) Labs Reviewed  LIPASE, BLOOD - Abnormal; Notable for the following components:      Result Value   Lipase 129 (*)    All other components within normal limits  COMPREHENSIVE METABOLIC PANEL - Abnormal; Notable for the following components:   Glucose, Bld 119 (*)    Total Protein 8.6 (*)  All other components within normal limits  CBC  TROPONIN I  URINALYSIS, ROUTINE W REFLEX MICROSCOPIC     EKG None  Radiology Dg Chest 2 View  Result Date: 11/18/2018 CLINICAL DATA:  Epigastric pains.  Chest pain. EXAM: CHEST - 2 VIEW COMPARISON:  Chest x-ray September 17, 2018. Chest CT March 30, 2018. FINDINGS: Chronic interstitial lung disease identified with peripheral reticular markings, unchanged since September 17, 2018. Mildly more focal opacity in the lateral aspect of the left upper lung. No other focal infiltrate. A nodule seen over the right mid lung, better seen on the CT scan from September 2019. No other interval changes in the lungs. The cardiomediastinal silhouette is unremarkable. Hyperinflation of the lungs. IMPRESSION: 1. Chronic interstitial lung disease. 2. Mildly more focal opacity in the lateral aspect of the left upper lung may represent atelectasis or subtle early infiltrate. Recommend short-term follow-up to ensure resolution. 3. The nodule in the right mid lung was better seen on the CT scan from September 2019. 4. Hyperinflation of the lungs consistent with emphysema. Electronically Signed   By: Dorise Bullion III M.D   On: 11/18/2018 12:14   Ct Abdomen Pelvis W Contrast  Result Date: 11/18/2018 CLINICAL DATA:  Upper abdominal pain, nausea, vomiting and diarrhea. Known pancreatic mass history of pseudocyst and history of acute on chronic pancreatitis. EXAM: CT ABDOMEN AND PELVIS WITH CONTRAST TECHNIQUE: Multidetector CT imaging of the abdomen and pelvis was performed using the standard protocol following bolus administration of intravenous contrast. CONTRAST:  110m OMNIPAQUE IOHEXOL 300 MG/ML  SOLN COMPARISON:  MRI of the abdomen on 08/20/2018 FINDINGS: Lower chest: Stable severe emphysematous lung disease with peripheral honeycombing. Hepatobiliary: Stable mild dilatation of intrahepatic bile ducts and mild gallbladder distension. No liver lesions. Pancreas: Again noted is diffuse abnormality of the pancreas consisting of irregular dilatation of the pancreatic duct,  multiple intraductal calculi and area cystic and soft tissue prominence in the mid body portion of the pancreas. The area mid body soft tissue prominence may be slightly larger but appears to be mostly due to some increased prominence in ductal dilatation which measures up to 15-17 mm in the body of the pancreas. It is difficult to delineate the duodenal lumen from a potential separate pseudocyst adjacent and predominately superior to the head of the pancreas. This pseudocyst may invaginate into the duodenal wall and appears to cause relative duodenal and gastric outlet obstruction. Area of fluid appears larger than by prior imaging and measures up to 5.6 x 7.3 cm transversely. Spleen: Status post splenectomy. Adrenals/Urinary Tract: Adrenal glands are unremarkable. Kidneys are normal, without renal calculi, focal lesion, or hydronephrosis. Bladder is unremarkable. Stomach/Bowel: The stomach is distended with fluid. At the level of the distal stomach and proximal duodenum, circumferential wall thickening is present with likely relative outlet obstruction. This is also at the level of the presumed pseudocyst which likely invaginate into the wall of the proximal duodenum. It is somewhat unclear whether this cyst also may communicate with the lumen of the duodenum. No evidence of small-bowel obstruction, colonic dilatation or free intraperitoneal air. Vascular/Lymphatic: Stable atherosclerosis of the abdominal aorta without aneurysm. No enlarged lymph nodes identified. Reproductive: Prostate is unremarkable. Other: No abdominal wall hernia or abnormality. No abdominopelvic ascites. Musculoskeletal: No acute or significant osseous findings. IMPRESSION: 1. Fluid filled and distended stomach with probable relative gastric outlet obstruction secondary to chronic inflammation and mass effect from a probable pseudocyst invaginating into the duodenal wall. This pseudocyst appears larger compared to  a prior CT in 2019. 2.  Chronic abnormality of the pancreas consisting of marked irregular pancreatic ductal dilatation with areas of calculi and soft tissue prominence. The area solid and cystic prominence in the mid body appears slightly larger, felt to largely be secondary to increased ductal dilatation. Although findings may be secondary to chronic pancreatitis, underlying ductal neoplasm such as IPMN remains in the differential. Electronically Signed   By: Aletta Edouard M.D.   On: 11/18/2018 14:09    Procedures Procedures (including critical care time)  Medications Ordered in ED Medications  ondansetron (ZOFRAN) injection 4 mg (4 mg Intravenous Given 11/18/18 1212)  sodium chloride 0.9 % bolus 1,000 mL (0 mLs Intravenous Stopped 11/18/18 1323)  HYDROmorphone (DILAUDID) injection 1 mg (1 mg Intravenous Given 11/18/18 1212)  iohexol (OMNIPAQUE) 300 MG/ML solution 100 mL (100 mLs Intravenous Contrast Given 11/18/18 1303)     Initial Impression / Assessment and Plan / ED Course  I have reviewed the triage vital signs and the nursing notes.  Pertinent labs & imaging results that were available during my care of the patient were reviewed by me and considered in my medical decision making (see chart for details).        Gastric outlet obstruction from increasing pseudocyst. [facility] consult as he see's Dr Rush Landmark with [facility]. Ng tube now. NPO. IVFs. pts daughter [name] updated. Ad[facility] to [facility]  Final Clinical Impressions(s) / ED Diagnoses   Final diagnoses:  None    ED Discharge Orders    None       Jola Schmidt, MD 11/18/18 1445

## 2018-11-18 NOTE — H&P (Addendum)
[facility] Admission History and Physical Service Pager: [phone]  Patient name: Timothy Martinez Medical record number: [id number] Date of birth: 06-Mar-1951 Age: 68 y.o. Gender: male  Primary Care Provider: [name], DO Consultants: GI Code Status: Full Preferred Emergency Contact: [name], daughter, [phone]  Chief Complaint: N/v/abdominal pain  Assessment and Plan: Timothy Martinez is a 68 y.o. male presenting with 1 week history of nausea and vomiting, found to have an enlargement of known pseudocyst with subsequent duodenal obstruction. PMH is significant for Chronic pancreatitis with pseudocyst, type 2 diabetes, COPD, paroxysmal atrial fibrillation s/p cardioversion, GERD, hyperlipidemia, chronic lower back pain, tobacco use, history of solitary pulmonary nodule, and previous splenectomy.   N/V 2/2 obstruction w/ enlarging known pancreatic pseudocyst: Worsening.   Pt reports a one week history of worsening nausea, NBNB emesis, and burning abdominal pain with minimal po intake. CT abdomen showing distended stomach with gastric outlet obstruction 2/2 chronic inflammation and mass effect of pseudocyst, with associated enlargement of pseudocyst since 2019. Lipase 129. Clinical presentation likely secondary to duodenal obstruction with pseudocyst as discussed above, NG already in place with >300 ml of fecal colored gastric output. Less likely acute on chronic pancreatitis as his pain is atypical for normal flare and lipase minimally elevated, however may be contributing to presentation. Low concern for infectious etiology without fever, leukocytosis, or associated diarrhea.  - Admit to [place], attending Dr. McDiarmid  - GI consulted in the ED, appreciate recommendations,current plan to observe overnight and GI will consult Dr. Lilia Argue - N.p.o. with NG tube in place - IVF w/ NS 128m/hr (will add D5 4/26 if plan extended NPO) - Dilaudid 1 mg every  4 as needed - PPI 40 mg IV daily - CMP, CBC - Monitor vitals per routine  Atypical chest pain, associated with above: Resolved. Endorsed burning/pressure in his chest that was relieved with nitro this morning.  No relation to activity.  EKG NSR.  Likely related to presenting symptoms of above associated with burning abdominal pain, however will rule out acute cardiac etiology.  No known CAD, however does have a history of atrial fibrillation, HFpEF, and diabetes.   - EKG in a.m. - Trend troponin - Vitals per routine  Pancreatitic duct dilation in setting of chronic pancreatitis: Chronic.  Possible slight enlargement of dilatation at 15-17 mm, measured 15 mm on MRCP in 07/2018.  Chronic in nature, not believed to be secondary to stone.  However, has been following with [facility], who was recommending endoscopic ultrasound in the near future to reliably rule out IPMN. Does not take any pancreatitic supplementation for chronic pancreatitis.  - GI consulted, appreciate further recommendations  Type 2 diabetes: Chronic, stable.  A1c 7.2 in 09/2018.  CBG 119 on admit.  Takes metformin 1000 twice daily for control. - SSI - CBG every 4 while n.p.o. - Hold home metformin while admitted  Paroxysmal atrial fib s/p cardioversion: Chronic, stable. Currently in sinus rhythm via exam and EKG, HR 60-70s.  Takes Xarelto and metoprolol at home.  Concerns patient has been taking Xarelto incorrectly, noted on med rec that he reports taking Xarelto 20 mg twice daily rather than nightly. - Transition to treatment dose heparin IV while n.p.o., especially in case of any procedure - Lopressor 10 mg IV every 6, transition from home metoprolol 75 twice daily (there is room to increase if needed to true conversion of 173m - Clarify home Xarelto dose  COPD: Chronic, stable. No increased WOB and  on RA satting appropriately.  Takes Dulera twice daily at home, reports often forgetting his nighttime dose.  Uses albuterol  approximately once/week. - Continue home Dulera -Albuterol PRN - Discussed importance of taking controller medication on a regular basis  HFpEF: Chronic, stable. EF 55-60% with G1 DD on echo in 12/2014.  Euvolemic on exam. Follows with heart care, last seen in 2019 without concern.  - Monitor fluid status, BP  GERD: Chronic, stable. - Protonix 40 mg IV as above  Hyperlipidemia: Chronic, stable. LDL 41 in 10/2017.  - Lipid panel - Continue home atorvastatin 40 mg daily  Chronic lower back pain: Chronic, stable. No current symptomatology. - Monitor for symptoms, on Dilaudid for above  Tobacco use: Chronic. Slowly cutting back, 1 PPD.  Patient declines nicotine patch. - May add on patch as needed - Cessation counseling  Previous alcohol use: Stable. Reports last drink in 02/2018.  Congratulated and encouraged him to keep this up.  Colonic Polyps:  Three sessile polpys resected during colonoscopy in 2019. Recommended f/u colonoscopy this year, 2020, due to poor bow preparation previousy.  Follows with GI, already has appointment scheduled for this.  FEN/GI: N.p.o. with NG tube, Protonix Prophylaxis: On treatment Heparin w/ h/o afib   Disposition: Admit to medsurg, attending Dr. McDiarmid   History of Present Illness:  Timothy Martinez is a 68 y.o. male with a history of chronic pancreatitis with pancreatic pseudocyst and pancreatic duct dilatation presenting with a one-week history of nausea, vomiting, and abdominal pain.  Patient states he started vomiting over a week ago on Friday, 4/17, with associated nausea and burning abdominal pain that would start in his bilateral lower quadrants and radiate up into his chest.  Feels different than his previous pancreatitis pains.  Took some Tylenol and nitro that helped his pain.  Pain not related to exertion or movements.  Emesis is nonbloody, nonbilious, last episode clear, has been having approximately 1 episode daily. Has not had a true  bowel movement in over a week, states "all week has been blowouts," that he describes as just water/fluid via rectum.  Has not been eating much, last ate solid food on Friday, 4/24, consisting of mashed potatoes and mac & cheese.  Has only been drinking some water over the weekend which has made his stomach hurt.   ED course: On arrival, he was hemodynamically stable and with abdominal pain.  CT abdomen showing fluid-filled and distended stomach with relative gastric outlet obstruction in the setting of chronic inflammation and mass-effect from duodenal pseudocyst into duodenal wall.  Pseudocyst appears larger compared to previous in 2019.  Lipase 129, AST ALT/bilirubin WNL. Cr 0.88. EKG NSR.  He received 1L NS bolus and 1 mg Dilaudid for pain control.  NG tube placed for decompression.  Review Of Systems: Per HPI with the following additions:  Review of Systems  Constitutional: Negative for chills, fever and malaise/fatigue.  HENT: Negative for sore throat.   Respiratory: Negative for cough, hemoptysis, sputum production, shortness of breath and wheezing.   Cardiovascular: Positive for chest pain and palpitations. Negative for orthopnea, leg swelling and PND.  Gastrointestinal: Positive for abdominal pain, diarrhea, heartburn, nausea and vomiting. Negative for blood in stool, constipation and melena.  Genitourinary: Positive for frequency. Negative for dysuria, flank pain, hematuria and urgency.  Neurological: Negative for dizziness, focal weakness, loss of consciousness and weakness.    Patient Active Problem List   Diagnosis Date Noted  . Pain of joint of left ankle and  foot 06/13/2018  . History of colonic polyps 05/25/2018  . Colon cancer screening 04/16/2018  . Pancreatic cyst 04/16/2018  . Dilation of pancreatic duct 04/16/2018  . Hypokalemia 03/28/2018  . Hypomagnesemia 03/28/2018  . 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 ST segment depression 03/22/2018  . Protein-calorie malnutrition, severe  03/22/2018  . Other partial intestinal obstruction ([place])   . Atrial fibrillation with RVR ([place]) 03/21/2018  . Encounter for screening for malignant neoplasm of respiratory organs 03/20/2018  . Constipation   . Pancreatic pseudocyst 03/11/2018  . Blood in stool 02/19/2018  . Loss of weight 02/19/2018  . Declined smoking cessation 02/19/2018  . Acute on chronic pancreatitis ([place]) 10/31/2017  . Solitary pulmonary nodule 05/12/2017  . Pancreatitis, alcoholic, acute 41/96/2229  . Chronic atrial fibrillation   . On continuous oral anticoagulation 06/19/2015  . Type 2 diabetes mellitus without complication ([place])   . Chronic pancreatitis ([place]) 08/04/2014  . Chronic left-sided low back pain with sciatica 12/21/2012  . COPD (chronic obstructive pulmonary disease) ([place]) 04/27/2012  . GERD (gastroesophageal reflux disease) 03/05/2012  . Alcohol use disorder, mild, in early remission, abuse 06/09/2011  . Diabetes mellitus ([place]) 05/30/2011  . Chronic alcoholic pancreatitis ([place]) 05/30/2011  . Hyperlipidemia 05/30/2011  . Tobacco abuse 05/30/2011    Past Medical History: Past Medical History:  Diagnosis Date  . Acute on chronic pancreatitis ([place]) 05/17/2016  . Alcohol abuse   . Alcoholic pancreatitis   . Anginal pain ([place])   . Anxiety and depression 01/20/2015   PHQ 9 = 11 (01/20/15)    . Arthritis    "eat up w/it" (05/17/2016)  . Atrial fibrillation status post cardioversion ([place]) 08/2014  . Atrial flutter, unspecified   . CHF (congestive heart failure) ([place])   . Chronic anticoagulation   . Chronic bronchitis ([place])   . COLD (chronic obstructive lung disease) ([place])   . Daily headache    "need eye examined" (05/17/2016)  . GERD (gastroesophageal reflux disease)   . Hematuria, microscopic 02/13/2015   Noted on UA - Recheck UA at next visit ~ 1 months   . Hyperlipidemia   . Hypertension   . Insomnia 02/11/2016  . Lateral epicondylitis of right elbow 03/17/2017  . Left foot pain 10/12/2013   . Low back pain 12/21/2012  . Malnutrition of moderate degree 05/18/2016  . Neck pain, bilateral posterior 10/15/2014  . Neuropathy 03/16/2015  . Pain of right upper extremity 05/30/2016  . Pancreatic pseudocyst    seen on CT scan 07/2014  . Pancreatitis 08/04/2014  . Permanent atrial fibrillation 05/05/2017  . Pneumonia    "?a few times" (05/17/2016)  . 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 Tobacco abuse 05/30/2011   Heavy smoker up to 3 ppd down to 15 cigs per day in 09/2014   . Type II diabetes mellitus ([place])     Past Surgical History: Past Surgical History:  Procedure Laterality Date  . BACK SURGERY    . CARDIOVERSION N/A 09/22/2014   Procedure: CARDIOVERSION;  Surgeon: Thayer Headings, MD;  Location: [facility] ENDOSCOPY;  Service: Cardiovascular;  Laterality: N/A;  . NECK SURGERY  1974   not cervical, states had lesion on neck which was removed  . POSTERIOR LAMINECTOMY / DECOMPRESSION LUMBAR SPINE  10/2006   of L4, L5 and S1 with a 5-1 diskectomy, microdissection with the microscope/notes 10/24/2006  . SPLENECTOMY  2003    Social History: Social History   Tobacco Use  . Smoking status: Current Every Day Smoker    Packs/day:  1.00    Years: 52.00    Pack years: 52.00    Types: Cigarettes    Start date: 04/09/1964  . Smokeless tobacco: Never Used  . Tobacco comment: down to one pack daily  Substance Use Topics  . Alcohol use: Not Currently  . Drug use: No   Additional social history: Smokes 1 pack a day, non-alcoholic  Please also refer to relevant sections of EMR.  Family History: Family History  Problem Relation Age of Onset  . Diabetes Mother   . Aneurysm Mother   . Stroke Mother   . Hypertension Mother   . Alcohol abuse Father   . Cancer Father        Lung  . Cancer Sister        Lung Cancer  . Post-traumatic stress disorder Brother   . Heart attack Neg Hx   . Colon cancer Neg Hx   . Liver cancer Neg Hx   . Rectal cancer Neg Hx   . Esophageal cancer Neg Hx   . Stomach  cancer Neg Hx   . Inflammatory bowel disease Neg Hx    Allergies and Medications: Allergies  Allergen Reactions  . Prozac [Fluoxetine Hcl] Nausea Only    Made the patient very sick to his stomach  . Morphine And Related Swelling    Pt states he can tolerate oxycodone and hydromorphone  . Penicillins Rash    Has patient had a PCN reaction causing immediate rash, facial/tongue/throat swelling, SOB or lightheadedness with hypotension: Yes Has patient had a PCN reaction causing severe rash involving mucus membranes or skin necrosis: No Has patient had a PCN reaction that required hospitalization: No Has patient had a PCN reaction occurring within the last 10 years: No If all of the above answers are "NO", then may proceed with Cephalosporin use.    No current facility-administered medications on file prior to encounter.    Current Outpatient Medications on File Prior to Encounter  Medication Sig Dispense Refill  . acetaminophen (TYLENOL) 325 MG tablet Take 2 tablets (650 mg total) by mouth every 6 (six) hours.    Marland Kitchen amitriptyline (ELAVIL) 10 MG tablet TAKE 1 TABLET BY MOUTH EVERYDAY AT BEDTIME (Patient taking differently: Take 10 mg by mouth at bedtime. ) 90 tablet 2  . atorvastatin (LIPITOR) 40 MG tablet Take 1 tablet (40 mg total) by mouth daily at 6 PM. 90 tablet 3  . cyanocobalamin 500 MCG tablet Take 500 mcg by mouth daily.    Marland Kitchen gabapentin (NEURONTIN) 100 MG capsule TAKE 1 CAPSULE BY MOUTH THREE TIMES A DAY (Patient taking differently: Take 100 mg by mouth 3 (three) times daily. ) 270 capsule 2  . magnesium oxide (MAG-OX) 400 MG tablet TAKE 1 TABLET BY MOUTH EVERY DAY (Patient taking differently: Take 400 mg by mouth daily. ) 60 tablet 0  . metFORMIN (GLUCOPHAGE) 1000 MG tablet Take 1 tablet (1,000 mg total) by mouth 2 (two) times daily with a meal. 180 tablet 3  . metoprolol tartrate (LOPRESSOR) 50 MG tablet Take 1.5 tablets (75 mg total) by mouth 2 (two) times daily. 270 tablet 3  .  mometasone-formoterol (DULERA) 200-5 MCG/ACT AERO Inhale 2 puffs into the lungs 2 (two) times daily. 3 Inhaler 1  . nitroGLYCERIN (NITROSTAT) 0.4 MG SL tablet Place 1 tablet (0.4 mg total) under the tongue every 5 (five) minutes as needed for chest pain. 100 tablet 3  . pantoprazole (PROTONIX) 40 MG tablet Take 1 tablet (40 mg total) by  mouth daily. 90 tablet 3  . polyethylene glycol (MIRALAX / GLYCOLAX) 17 g packet TAKE 17 G BY MOUTH DAILY. 14 packet 0  . XARELTO 20 MG TABS tablet TAKE 1 TABLET BY MOUTH DAILY WITH SUPPER (Patient taking differently: Take 20 mg by mouth 2 (two) times a day. ) 90 tablet 1  . Blood Glucose Monitoring Suppl (ONE TOUCH ULTRA 2) w/Device KIT Dispense one glucometer. Diagnosis Type 2 Diabetes. E11.9 1 each 0  . diclofenac sodium (VOLTAREN) 1 % GEL Apply 4 g topically 4 (four) times daily. (Patient not taking: Reported on 11/18/2018) 1 Tube 2  . glucose blood (ONE TOUCH ULTRA TEST) test strip Use check fasting blood glucose qAM. Type 2 Diabetes E11.9 100 each 3  . HYDROcodone-acetaminophen (NORCO) 5-325 MG tablet Take 1 tablet by mouth every 4 (four) hours as needed for severe pain. (Patient not taking: Reported on 11/18/2018) 15 tablet 0  . Lancets (FREESTYLE) lancets Use as instructed 100 each 12  . Lancets (ONETOUCH ULTRASOFT) lancets Use as instructed 100 each 12  . ondansetron (ZOFRAN) 4 MG tablet Take 1 tablet (4 mg total) by mouth every 8 (eight) hours as needed for nausea or vomiting. (Patient not taking: Reported on 11/18/2018) 30 tablet 0    Objective: BP 135/78   Pulse 60   Temp 97.8 F (36.6 C) (Oral)   Resp (!) 24   Ht 6' 3" (1.905 m)   Wt 78.9 kg   SpO2 96%   BMI 21.75 kg/m  Exam: General: Alert, thin older gentleman, slightly uncomfortable has NG tube which is placed HEENT: NCAT, MM dry, EOMI Cardiac: RRR no m/g/r Lungs: Clear bilaterally, no increased WOB on RA without appreciable wheezing Abdomen: soft thin abdomen with hypoactive bowel sounds,  nontender, nondistended.  No hepatomegaly palpated. Msk: Moves all extremities spontaneously  Ext: Warm, dry, 2+ distal pulses, no edema  Derm: No rashes or bruising Psych: Appropriate affect  Labs and Imaging: CBC BMET  Recent Labs  Lab 11/18/18 1105  WBC 9.9  HGB 15.7  HCT 48.8  PLT 335   Recent Labs  Lab 11/18/18 1105  NA 137  K 3.9  CL 100  CO2 26  BUN 16  CREATININE 0.88  GLUCOSE 119*  CALCIUM 10.0     Dg Chest 2 View  Result Date: 11/18/2018 CLINICAL DATA:  Epigastric pains.  Chest pain. EXAM: CHEST - 2 VIEW COMPARISON:  Chest x-ray September 17, 2018. Chest CT March 30, 2018. FINDINGS: Chronic interstitial lung disease identified with peripheral reticular markings, unchanged since September 17, 2018. Mildly more focal opacity in the lateral aspect of the left upper lung. No other focal infiltrate. A nodule seen over the right mid lung, better seen on the CT scan from September 2019. No other interval changes in the lungs. The cardiomediastinal silhouette is unremarkable. Hyperinflation of the lungs. IMPRESSION: 1. Chronic interstitial lung disease. 2. Mildly more focal opacity in the lateral aspect of the left upper lung may represent atelectasis or subtle early infiltrate. Recommend short-term follow-up to ensure resolution. 3. The nodule in the right mid lung was better seen on the CT scan from September 2019. 4. Hyperinflation of the lungs consistent with emphysema. Electronically Signed   By: Dorise Bullion III M.D   On: 11/18/2018 12:14   Ct Abdomen Pelvis W Contrast  Result Date: 11/18/2018 CLINICAL DATA:  Upper abdominal pain, nausea, vomiting and diarrhea. Known pancreatic mass history of pseudocyst and history of acute on chronic pancreatitis. EXAM: CT  ABDOMEN AND PELVIS WITH CONTRAST TECHNIQUE: Multidetector CT imaging of the abdomen and pelvis was performed using the standard protocol following bolus administration of intravenous contrast. CONTRAST:  189m  OMNIPAQUE IOHEXOL 300 MG/ML  SOLN COMPARISON:  MRI of the abdomen on 08/20/2018 FINDINGS: Lower chest: Stable severe emphysematous lung disease with peripheral honeycombing. Hepatobiliary: Stable mild dilatation of intrahepatic bile ducts and mild gallbladder distension. No liver lesions. Pancreas: Again noted is diffuse abnormality of the pancreas consisting of irregular dilatation of the pancreatic duct, multiple intraductal calculi and area cystic and soft tissue prominence in the mid body portion of the pancreas. The area mid body soft tissue prominence may be slightly larger but appears to be mostly due to some increased prominence in ductal dilatation which measures up to 15-17 mm in the body of the pancreas. It is difficult to delineate the duodenal lumen from a potential separate pseudocyst adjacent and predominately superior to the head of the pancreas. This pseudocyst may invaginate into the duodenal wall and appears to cause relative duodenal and gastric outlet obstruction. Area of fluid appears larger than by prior imaging and measures up to 5.6 x 7.3 cm transversely. Spleen: Status post splenectomy. Adrenals/Urinary Tract: Adrenal glands are unremarkable. Kidneys are normal, without renal calculi, focal lesion, or hydronephrosis. Bladder is unremarkable. Stomach/Bowel: The stomach is distended with fluid. At the level of the distal stomach and proximal duodenum, circumferential wall thickening is present with likely relative outlet obstruction. This is also at the level of the presumed pseudocyst which likely invaginate into the wall of the proximal duodenum. It is somewhat unclear whether this cyst also may communicate with the lumen of the duodenum. No evidence of small-bowel obstruction, colonic dilatation or free intraperitoneal air. Vascular/Lymphatic: Stable atherosclerosis of the abdominal aorta without aneurysm. No enlarged lymph nodes identified. Reproductive: Prostate is unremarkable. Other:  No abdominal wall hernia or abnormality. No abdominopelvic ascites. Musculoskeletal: No acute or significant osseous findings. IMPRESSION: 1. Fluid filled and distended stomach with probable relative gastric outlet obstruction secondary to chronic inflammation and mass effect from a probable pseudocyst invaginating into the duodenal wall. This pseudocyst appears larger compared to a prior CT in 2019. 2. Chronic abnormality of the pancreas consisting of marked irregular pancreatic ductal dilatation with areas of calculi and soft tissue prominence. The area solid and cystic prominence in the mid body appears slightly larger, felt to largely be secondary to increased ductal dilatation. Although findings may be secondary to chronic pancreatitis, underlying ductal neoplasm such as IPMN remains in the differential. Electronically Signed   By: GAletta EdouardM.D.   On: 11/18/2018 14:09    BPatriciaann Clan DO 11/18/2018, 2:58 PM PGY-1, [facility] pager: [phone] text pages welcome  FPTS Upper-Level Resident Addendum   I have independently interviewed and examined the patient. I have discussed the above with the original author and agree with their documentation. My edits for correction/addition/clarification are in blue. Please see also any attending notes.    SSherene Sires DO PGY-2, [facility] 11/18/2018 6:12 PM  FPTS Service pager: [phone](text pages welcome through [facility]

## 2018-11-18 NOTE — Progress Notes (Signed)
Received pt from ED, alert, oriented x4, VSS, with NGT in place, oriented to room and department. Will continue monitor.

## 2018-11-18 NOTE — Progress Notes (Signed)
ANTICOAGULATION CONSULT NOTE - Initial Consult  Pharmacy Consult for heparin Indication: atrial fibrillation  Allergies  Allergen Reactions  . Prozac [Fluoxetine Hcl] Nausea Only    Made the patient very sick to his stomach  . Morphine And Related Swelling    Pt states he can tolerate oxycodone and hydromorphone  . Penicillins Rash    Has patient had a PCN reaction causing immediate rash, facial/tongue/throat swelling, SOB or lightheadedness with hypotension: Yes Has patient had a PCN reaction causing severe rash involving mucus membranes or skin necrosis: No Has patient had a PCN reaction that required hospitalization: No Has patient had a PCN reaction occurring within the last 10 years: No If all of the above answers are "NO", then may proceed with Cephalosporin use.     Patient Measurements: Height: 6\' 3"  (190.5 cm) Weight: 174 lb (78.9 kg) IBW/kg (Calculated) : 84.5 Heparin Dosing Weight: 78.9 kg  Vital Signs: Temp: 98.6 F (37 C) (04/26 1553) Temp Source: Oral (04/26 1553) BP: 145/71 (04/26 1553) Pulse Rate: 89 (04/26 1553)  Labs: Recent Labs    11/18/18 1105  HGB 15.7  HCT 48.8  PLT 335  CREATININE 0.88  TROPONINI <0.03    Estimated Creatinine Clearance: 90.9 mL/min (by C-G formula based on SCr of 0.88 mg/dL).   Medical History: Past Medical History:  Diagnosis Date  . Acute on chronic pancreatitis ([place]) 05/17/2016  . Alcohol abuse   . Alcoholic pancreatitis   . Anginal pain ([place])   . Anxiety and depression 01/20/2015   PHQ 9 = 11 (01/20/15)    . Arthritis    "eat up w/it" (05/17/2016)  . Atrial fibrillation status post cardioversion ([place]) 08/2014  . Atrial flutter, unspecified   . CHF (congestive heart failure) ([place])   . Chronic anticoagulation   . Chronic bronchitis ([place])   . COLD (chronic obstructive lung disease) ([place])   . Daily headache    "need eye examined" (05/17/2016)  . GERD (gastroesophageal reflux disease)   . Hematuria, microscopic  02/13/2015   Noted on UA - Recheck UA at next visit ~ 1 months   . Hyperlipidemia   . Hypertension   . Insomnia 02/11/2016  . Lateral epicondylitis of right elbow 03/17/2017  . Left foot pain 10/12/2013  . Low back pain 12/21/2012  . Malnutrition of moderate degree 05/18/2016  . Neck pain, bilateral posterior 10/15/2014  . Neuropathy 03/16/2015  . Pain of right upper extremity 05/30/2016  . Pancreatic pseudocyst    seen on CT scan 07/2014  . Pancreatitis 08/04/2014  . Permanent atrial fibrillation 05/05/2017  . Pneumonia    "?a few times" (05/17/2016)  . 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 Tobacco abuse 05/30/2011   Heavy smoker up to 3 ppd down to 15 cigs per day in 09/2014   . Type II diabetes mellitus (HCC)     Medications:  Scheduled:  . insulin aspart  0-9 Units Subcutaneous TID WC  . metoprolol tartrate  10 mg Intravenous Q6H  . mometasone-formoterol  2 puff Inhalation BID  . pantoprazole (PROTONIX) IV  40 mg Intravenous Q24H    Assessment: 52 yom presenting with worsening abdominal pain and N/V, on Xarelto PTA for hx Afib - last dose on 4/26@0615 .  CT abdomen showing probable pseudocyst and chronic pancreatitis. Hgb 15.7, plt 335. Scr 0.88. No s/sx of bleeding.   Given recent Xarelto dosing, will monitor both aPTT and heparin level until correlate.   Goal of Therapy:  Heparin level 0.3-0.7 units/ml aPTT 66-102  seconds Monitor platelets by anticoagulation protocol: Yes   Plan:  Start heparin infusion at 1100 units/hr on 4/27@0600  Check anti-Xa and aPTT level in 6 hours and daily while on heparin Continue to monitor H&H and platelets  Antonietta Jewel, PharmD, [place] Clinical Pharmacist  Pager: [phone] Phone: [phone] 11/18/2018,4:15 PM

## 2018-11-18 NOTE — Consult Note (Signed)
Consultation  Referring Provider:     [facility]MT[facility] Primary Care Physician:  [name], DO Primary Gastroenterologist:        [place] Reason for Consultation:     GOO+ pancreatic cyst     Impression / Plan:   Gastric Outlet Obstruction from enlarged pancreatic cyst Chronic pancreatitis from EtOH (now abstinent) Paroxysmal AF on heparin   Agree w/ supportive care NG suction If he remains NPO should add some D5 to his fluids We will f/u tomorrow and get Dr. Donneta Romberg input - re: is this cyst amenable to EUS-guided drainage - it seems like it is but not my area of expertise so will defer to Dr. Thea Alken, MD, [facility] 11/18/2018 4:54 PM Pager [phone]          HPI:   Timothy Martinez is a 68 y.o. male w/ hx chronic calcific pancreatitis from EtOH and known approx 3 cm cyst in head of pancreas who has been sick for about 1 week with post-prandial nausea and vomiting associated with explosive stools. He was trying to avoid hospital due to Covid-19 but persistent problems led him to present. CT scan has shown about a doubling in size of the HOP cyst causing a gastric outlety obstruction. He had NG tube placed in ED and several hundred CC dark fluid aspirated. Feels a little better. No return to EtOH (sober since 8/19) and no blunt abdominal trauma. Had been lifting some logs helping brother split wood.  He was to have a repeat CT in May and then an EUS/FNA    CT ABDOMEN PELVIS W CONTRAST images viewed by me and reviewed w/ patient CLINICAL DATA:  Upper abdominal pain, nausea, vomiting and diarrhea. Known pancreatic mass history of pseudocyst and history of acute on chronic pancreatitis.  EXAM: CT ABDOMEN AND PELVIS WITH CONTRAST  TECHNIQUE: Multidetector CT imaging of the abdomen and pelvis was performed using the standard protocol following bolus administration of intravenous contrast.  CONTRAST:  169m OMNIPAQUE IOHEXOL 300  MG/ML  SOLN  COMPARISON:  MRI of the abdomen on 08/20/2018  FINDINGS: Lower chest: Stable severe emphysematous lung disease with peripheral honeycombing.  Hepatobiliary: Stable mild dilatation of intrahepatic bile ducts and mild gallbladder distension. No liver lesions.  Pancreas: Again noted is diffuse abnormality of the pancreas consisting of irregular dilatation of the pancreatic duct, multiple intraductal calculi and area cystic and soft tissue prominence in the mid body portion of the pancreas. The area mid body soft tissue prominence may be slightly larger but appears to be mostly due to some increased prominence in ductal dilatation which measures up to 15-17 mm in the body of the pancreas.  It is difficult to delineate the duodenal lumen from a potential separate pseudocyst adjacent and predominately superior to the head of the pancreas. This pseudocyst may invaginate into the duodenal wall and appears to cause relative duodenal and gastric outlet obstruction. Area of fluid appears larger than by prior imaging and measures up to 5.6 x 7.3 cm transversely.  Spleen: Status post splenectomy.  Adrenals/Urinary Tract: Adrenal glands are unremarkable. Kidneys are normal, without renal calculi, focal lesion, or hydronephrosis. Bladder is unremarkable.  Stomach/Bowel: The stomach is distended with fluid. At the level of the distal stomach and proximal duodenum, circumferential wall thickening is present with likely relative outlet obstruction. This is also at the level of the presumed pseudocyst which likely invaginate into the wall of the proximal duodenum. It is somewhat unclear  whether this cyst also may communicate with the lumen of the duodenum.  No evidence of small-bowel obstruction, colonic dilatation or free intraperitoneal air.  Vascular/Lymphatic: Stable atherosclerosis of the abdominal aorta without aneurysm. No enlarged lymph nodes identified.  Reproductive:  Prostate is unremarkable.  Other: No abdominal wall hernia or abnormality. No abdominopelvic ascites.  Musculoskeletal: No acute or significant osseous findings.  IMPRESSION: 1. Fluid filled and distended stomach with probable relative gastric outlet obstruction secondary to chronic inflammation and mass effect from a probable pseudocyst invaginating into the duodenal wall. This pseudocyst appears larger compared to a prior CT in 2019. 2. Chronic abnormality of the pancreas consisting of marked irregular pancreatic ductal dilatation with areas of calculi and soft tissue prominence. The area solid and cystic prominence in the mid body appears slightly larger, felt to largely be secondary to increased ductal dilatation. Although findings may be secondary to chronic pancreatitis, underlying ductal neoplasm such as IPMN remains in the differential.  Electronically Signed   By: Aletta Edouard M.D.   On: 11/18/2018 14:09   Past Medical History:  Diagnosis Date   Acute on chronic pancreatitis ([place]) 05/17/2016   Alcohol abuse    Alcoholic pancreatitis    Anginal pain ([place])    Anxiety and depression 01/20/2015   PHQ 9 = 11 (01/20/15)     Arthritis    "eat up w/it" (05/17/2016)   Atrial fibrillation status post cardioversion ([place]) 08/2014   Atrial flutter, unspecified    CHF (congestive heart failure) (HCC)    Chronic anticoagulation    Chronic bronchitis (HCC)    COLD (chronic obstructive lung disease) ([place])    Daily headache    "need eye examined" (05/17/2016)   GERD (gastroesophageal reflux disease)    Hematuria, microscopic 02/13/2015   Noted on UA - Recheck UA at next visit ~ 1 months    Hyperlipidemia    Hypertension    Insomnia 02/11/2016   Lateral epicondylitis of right elbow 03/17/2017   Left foot pain 10/12/2013   Low back pain 12/21/2012   Malnutrition of moderate degree 05/18/2016   Neck pain, bilateral posterior 10/15/2014   Neuropathy  03/16/2015   Pain of right upper extremity 05/30/2016   Pancreatic pseudocyst    seen on CT scan 07/2014   Pancreatitis 08/04/2014   Permanent atrial fibrillation 05/05/2017   Pneumonia    "?a few times" (05/17/2016)   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   Tobacco abuse 05/30/2011   Heavy smoker up to 3 ppd down to 15 cigs per day in 09/2014    Type II diabetes mellitus ([place])     Past Surgical History:  Procedure Laterality Date   BACK SURGERY     CARDIOVERSION N/A 09/22/2014   Procedure: CARDIOVERSION;  Surgeon: Thayer Headings, MD;  Location: [facility] ENDOSCOPY;  Service: Cardiovascular;  Laterality: N/A;   COLONOSCOPY W/ POLYPECTOMY  2019   NECK SURGERY  1974   not cervical, states had lesion on neck which was removed   POSTERIOR LAMINECTOMY / DECOMPRESSION LUMBAR SPINE  10/2006   of L4, L5 and S1 with a 5-1 diskectomy, microdissection with the microscope/notes 10/24/2006   SPLENECTOMY  2003    Family History  Problem Relation Age of Onset   Diabetes Mother    Aneurysm Mother    Stroke Mother    Hypertension Mother    Alcohol abuse Father    Cancer Father        Lung   Cancer Sister  Lung Cancer   Post-traumatic stress disorder Brother    Heart attack Neg Hx    Colon cancer Neg Hx    Liver cancer Neg Hx    Rectal cancer Neg Hx    Esophageal cancer Neg Hx    Stomach cancer Neg Hx    Inflammatory bowel disease Neg Hx     Social History   Social History Narrative   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     2 children one is Radio producer in transplant center   Single or divorced   Admits to alcohol abuse in past- no longer drinking- last drink 02/2018    Smoker   No drugs     Prior to Admission medications   Medication Sig Start Date End Date Taking? Authorizing Provider  acetaminophen (TYLENOL) 325 MG tablet Take 2 tablets (650 mg total) by mouth every 6 (six) hours. 03/23/18  Yes Meccariello, Bernita Raisin, DO   amitriptyline (ELAVIL) 10 MG tablet TAKE 1 TABLET BY MOUTH EVERYDAY AT BEDTIME Patient taking differently: Take 10 mg by mouth at bedtime.  03/14/18  Yes Lockamy, Timothy, DO  atorvastatin (LIPITOR) 40 MG tablet Take 1 tablet (40 mg total) by mouth daily at 6 PM. 02/19/18  Yes Lockamy, Timothy, DO  cyanocobalamin 500 MCG tablet Take 500 mcg by mouth daily.   Yes [provider]  gabapentin (NEURONTIN) 100 MG capsule TAKE 1 CAPSULE BY MOUTH THREE TIMES A DAY Patient taking differently: Take 100 mg by mouth 3 (three) times daily.  03/14/18  Yes Lockamy, Timothy, DO  magnesium oxide (MAG-OX) 400 MG tablet TAKE 1 TABLET BY MOUTH EVERY DAY Patient taking differently: Take 400 mg by mouth daily.  09/25/18  Yes [name], DO  metFORMIN (GLUCOPHAGE) 1000 MG tablet Take 1 tablet (1,000 mg total) by mouth 2 (two) times daily with a meal. 02/19/18  Yes Lockamy, Timothy, DO  metoprolol tartrate (LOPRESSOR) 50 MG tablet Take 1.5 tablets (75 mg total) by mouth 2 (two) times daily. 02/19/18  Yes Lockamy, Timothy, DO  mometasone-formoterol (DULERA) 200-5 MCG/ACT AERO Inhale 2 puffs into the lungs 2 (two) times daily. 09/27/18 12/26/18 Yes Lockamy, Timothy, DO  nitroGLYCERIN (NITROSTAT) 0.4 MG SL tablet Place 1 tablet (0.4 mg total) under the tongue every 5 (five) minutes as needed for chest pain. 03/23/18 03/23/19 Yes Meccariello, Bernita Raisin, DO  pantoprazole (PROTONIX) 40 MG tablet Take 1 tablet (40 mg total) by mouth daily. 10/01/18  Yes Lockamy, Timothy, DO  polyethylene glycol (MIRALAX / GLYCOLAX) 17 g packet TAKE 17 G BY MOUTH DAILY. 11/14/18  Yes Lockamy, Timothy, DO  XARELTO 20 MG TABS tablet TAKE 1 TABLET BY MOUTH DAILY WITH SUPPER Patient taking differently: Take 20 mg by mouth 2 (two) times a day.  09/25/18  Yes Lockamy, Timothy, DO  Blood Glucose Monitoring Suppl (ONE TOUCH ULTRA 2) w/Device KIT Dispense one glucometer. Diagnosis Type 2 Diabetes. E11.9 06/13/18   Rory Percy, DO  diclofenac sodium  (VOLTAREN) 1 % GEL Apply 4 g topically 4 (four) times daily. Patient not taking: Reported on 11/18/2018 06/13/18   Rory Percy, DO  glucose blood (ONE TOUCH ULTRA TEST) test strip Use check fasting blood glucose qAM. Type 2 Diabetes E11.9 10/01/18   [name], DO  HYDROcodone-acetaminophen (NORCO) 5-325 MG tablet Take 1 tablet by mouth every 4 (four) hours as needed for severe pain. Patient not taking: Reported on 11/18/2018 09/17/18   Sherwood Gambler, MD  Lancets (FREESTYLE) lancets Use as instructed 10/02/18   [name], DO  Lancets (ONETOUCH ULTRASOFT) lancets Use as instructed 10/02/18   Lockamy, Timothy, DO  ondansetron (ZOFRAN) 4 MG tablet Take 1 tablet (4 mg total) by mouth every 8 (eight) hours as needed for nausea or vomiting. Patient not taking: Reported on 11/18/2018 03/20/18   [name], DO    Current Facility-Administered Medications  Medication Dose Route Frequency Provider Last Rate Last Dose   0.9 %  sodium chloride infusion   Intravenous Continuous Patriciaann Clan, DO 125 mL/hr at 11/18/18 1639     [START ON 11/19/2018] heparin ADULT infusion 100 units/mL (25000 units/2109m sodium chloride 0.45%)  1,100 Units/hr Intravenous Continuous McDiarmid, TBlane Ohara MD       HYDROmorphone (DILAUDID) injection 1 mg  1 mg Intravenous Q4H PRN BSherene Sires DO       insulin aspart (novoLOG) injection 0-9 Units  0-9 Units Subcutaneous TID WC Bland, Scott, DO       metoprolol tartrate (LOPRESSOR) injection 10 mg  10 mg Intravenous Q6H Bland, Scott, DO       mometasone-formoterol (DULERA) 200-5 MCG/ACT inhaler 2 puff  2 puff Inhalation BID BSherene Sires DO       nitroGLYCERIN (NITROSTAT) SL tablet 0.4 mg  0.4 mg Sublingual Q5 min PRN BSherene Sires DO       pantoprazole (PROTONIX) injection 40 mg  40 mg Intravenous Q24H Bland, Scott, DO   40 mg at 11/18/18 1639    Allergies as of 11/18/2018 - Review Complete 11/18/2018  Allergen Reaction Noted   Prozac [fluoxetine hcl]  Nausea Only 03/27/2015   Morphine and related Swelling 05/28/2011   Penicillins Rash 05/28/2011     Review of Systems:    This is positive for those things mentioned in the HPI. All other review of systems are negative.       Physical Exam:  Vital signs in last 24 hours: Temp:  [97.7 F (36.5 C)-98.6 F (37 C)] 98.6 F (37 C) (04/26 1553) Pulse Rate:  [59-89] 89 (04/26 1553) Resp:  [2-28] 19 (04/26 1553) BP: (122-146)/(71-93) 145/71 (04/26 1553) SpO2:  [94 %-100 %] 100 % (04/26 1553) Weight:  [78.9 kg] 78.9 kg (04/26 1106)    General:  Well-developed, well-nourished and in no acute distress Eyes:  anicteric. ENT:   Mouth and posterior pharynx free of lesions. NG tube in place Neck:   supple w/o thyromegaly or mass.  Lungs: Few bibasilar crackles Heart:   S1S2, + S4 I think. Abdomen:  soft, non-tender, no hepatosplenomegaly, hernia, or mass and BS+.  Extremities:   no edema or cyanosis Skin   no rash. Neuro:  A&O x 3.  Psych:  appropriate mood and  Affect.   Data Reviewed:   LAB RESULTS: Recent Labs    11/18/18 1105  WBC 9.9  HGB 15.7  HCT 48.8  PLT 335   BMET Recent Labs    11/18/18 1105  NA 137  K 3.9  CL 100  CO2 26  GLUCOSE 119*  BUN 16  CREATININE 0.88  CALCIUM 10.0   LFT Recent Labs    11/18/18 1105  PROT 8.6*  ALBUMIN 4.2  AST 28  ALT 30  ALKPHOS 93  BILITOT 0.9    STUDIES: Dg Chest 2 View  Result Date: 11/18/2018 CLINICAL DATA:  Epigastric pains.  Chest pain. EXAM: CHEST - 2 VIEW COMPARISON:  Chest x-ray September 17, 2018. Chest CT March 30, 2018. FINDINGS: Chronic interstitial lung  disease identified with peripheral reticular markings, unchanged since September 17, 2018. Mildly more focal opacity in the lateral aspect of the left upper lung. No other focal infiltrate. A nodule seen over the right mid lung, better seen on the CT scan from September 2019. No other interval changes in the lungs. The cardiomediastinal silhouette  is unremarkable. Hyperinflation of the lungs. IMPRESSION: 1. Chronic interstitial lung disease. 2. Mildly more focal opacity in the lateral aspect of the left upper lung may represent atelectasis or subtle early infiltrate. Recommend short-term follow-up to ensure resolution. 3. The nodule in the right mid lung was better seen on the CT scan from September 2019. 4. Hyperinflation of the lungs consistent with emphysema. Electronically Signed   By: Dorise Bullion III M.D   On: 11/18/2018 12:14   Ct Abdomen Pelvis W Contrast  Result Date: 11/18/2018 CLINICAL DATA:  Upper abdominal pain, nausea, vomiting and diarrhea. Known pancreatic mass history of pseudocyst and history of acute on chronic pancreatitis. EXAM: CT ABDOMEN AND PELVIS WITH CONTRAST TECHNIQUE: Multidetector CT imaging of the abdomen and pelvis was performed using the standard protocol following bolus administration of intravenous contrast. CONTRAST:  111m OMNIPAQUE IOHEXOL 300 MG/ML  SOLN COMPARISON:  MRI of the abdomen on 08/20/2018 FINDINGS: Lower chest: Stable severe emphysematous lung disease with peripheral honeycombing. Hepatobiliary: Stable mild dilatation of intrahepatic bile ducts and mild gallbladder distension. No liver lesions. Pancreas: Again noted is diffuse abnormality of the pancreas consisting of irregular dilatation of the pancreatic duct, multiple intraductal calculi and area cystic and soft tissue prominence in the mid body portion of the pancreas. The area mid body soft tissue prominence may be slightly larger but appears to be mostly due to some increased prominence in ductal dilatation which measures up to 15-17 mm in the body of the pancreas. It is difficult to delineate the duodenal lumen from a potential separate pseudocyst adjacent and predominately superior to the head of the pancreas. This pseudocyst may invaginate into the duodenal wall and appears to cause relative duodenal and gastric outlet obstruction. Area of fluid  appears larger than by prior imaging and measures up to 5.6 x 7.3 cm transversely. Spleen: Status post splenectomy. Adrenals/Urinary Tract: Adrenal glands are unremarkable. Kidneys are normal, without renal calculi, focal lesion, or hydronephrosis. Bladder is unremarkable. Stomach/Bowel: The stomach is distended with fluid. At the level of the distal stomach and proximal duodenum, circumferential wall thickening is present with likely relative outlet obstruction. This is also at the level of the presumed pseudocyst which likely invaginate into the wall of the proximal duodenum. It is somewhat unclear whether this cyst also may communicate with the lumen of the duodenum. No evidence of small-bowel obstruction, colonic dilatation or free intraperitoneal air. Vascular/Lymphatic: Stable atherosclerosis of the abdominal aorta without aneurysm. No enlarged lymph nodes identified. Reproductive: Prostate is unremarkable. Other: No abdominal wall hernia or abnormality. No abdominopelvic ascites. Musculoskeletal: No acute or significant osseous findings. IMPRESSION: 1. Fluid filled and distended stomach with probable relative gastric outlet obstruction secondary to chronic inflammation and mass effect from a probable pseudocyst invaginating into the duodenal wall. This pseudocyst appears larger compared to a prior CT in 2019. 2. Chronic abnormality of the pancreas consisting of marked irregular pancreatic ductal dilatation with areas of calculi and soft tissue prominence. The area solid and cystic prominence in the mid body appears slightly larger, felt to largely be secondary to increased ductal dilatation. Although findings may be secondary to chronic pancreatitis, underlying ductal neoplasm such as  IPMN remains in the differential. Electronically Signed   By: Aletta Edouard M.D.   On: 11/18/2018 14:09   Dg Abd Portable 1 View  Result Date: 11/18/2018 CLINICAL DATA:  Worsening abdominal pain, nausea vomiting NG tube  placement EXAM: PORTABLE ABDOMEN - 1 VIEW COMPARISON:  CT 11/18/2018 FINDINGS: Peripheral ground-glass opacity at the bases. Esophageal tube tip folded back upon itself in the region of the gastric fundus. Contrast material within the visible collecting system of the kidneys IMPRESSION: Esophageal tube tip folded back upon itself in the region of the gastric fundus Electronically Signed   By: Donavan Foil M.D.   On: 11/18/2018 15:57     PREVIOUS ENDOSCOPIES:            2019 colonoscopy 2 ssp inadequate bowel prep  Thanks   LOS: 0 days   '@[email or web address] Griebel'  Simonne Maffucci, MD, [facility] @  11/18/2018, 4:54 PM

## 2018-11-19 ENCOUNTER — Encounter (HOSPITAL_COMMUNITY): Payer: Self-pay | Admitting: Family Medicine

## 2018-11-19 ENCOUNTER — Encounter (HOSPITAL_COMMUNITY)
Admission: EM | Disposition: A | Discharge status: Home / Self Care | Payer: Self-pay | Source: Home / Self Care | Attending: Family Medicine

## 2018-11-19 DIAGNOSIS — K8689 Other specified diseases of pancreas: Secondary | ICD-10-CM

## 2018-11-19 DIAGNOSIS — E861 Hypovolemia: Secondary | ICD-10-CM | POA: Diagnosis present

## 2018-11-19 DIAGNOSIS — K311 Adult hypertrophic pyloric stenosis: Secondary | ICD-10-CM | POA: Diagnosis present

## 2018-11-19 DIAGNOSIS — K863 Pseudocyst of pancreas: Principal | ICD-10-CM

## 2018-11-19 LAB — COMPREHENSIVE METABOLIC PANEL
ALT: 24 U/L (ref 0–44)
AST: 22 U/L (ref 15–41)
Albumin: 3.2 g/dL — ABNORMAL LOW (ref 3.5–5.0)
Alkaline Phosphatase: 75 U/L (ref 38–126)
Anion gap: 9 (ref 5–15)
BUN: 13 mg/dL (ref 8–23)
CO2: 25 mmol/L (ref 22–32)
Calcium: 8.5 mg/dL — ABNORMAL LOW (ref 8.9–10.3)
Chloride: 106 mmol/L (ref 98–111)
Creatinine, Ser: 0.85 mg/dL (ref 0.61–1.24)
GFR calc Af Amer: >60 mL/min (ref >60)
GFR calc non Af Amer: >60 mL/min (ref >60)
Glucose, Bld: 100 mg/dL — ABNORMAL HIGH (ref 70–99)
Potassium: 3.7 mmol/L (ref 3.5–5.1)
Sodium: 140 mmol/L (ref 135–145)
Total Bilirubin: 0.6 mg/dL (ref 0.3–1.2)
Total Protein: 6.5 g/dL (ref 6.5–8.1)

## 2018-11-19 LAB — GLUCOSE, CAPILLARY
Glucose-Capillary: 95 mg/dL (ref 70–99)
Glucose-Capillary: 110 mg/dL — ABNORMAL HIGH (ref 70–99)
Glucose-Capillary: 95 mg/dL (ref 70–99)
Glucose-Capillary: 129 mg/dL — ABNORMAL HIGH (ref 70–99)

## 2018-11-19 LAB — CBC
HCT: 40 % (ref 39.0–52.0)
Hemoglobin: 12.9 g/dL — ABNORMAL LOW (ref 13.0–17.0)
MCH: 29.7 pg (ref 26.0–34.0)
MCHC: 32.3 g/dL (ref 30.0–36.0)
MCV: 92 fL (ref 80.0–100.0)
Platelets: 294 10*3/uL (ref 150–400)
RBC: 4.35 MIL/uL (ref 4.22–5.81)
RDW: 14.6 % (ref 11.5–15.5)
WBC: 8.2 10*3/uL (ref 4.0–10.5)
nRBC: 0.2 % (ref 0.0–0.2)

## 2018-11-19 LAB — TROPONIN I
Troponin I: <0.03 ng/mL (ref <0.03)
Troponin I: <0.03 ng/mL (ref <0.03)

## 2018-11-19 LAB — HEPARIN LEVEL (UNFRACTIONATED): Heparin Unfractionated: 0.2 IU/mL — ABNORMAL LOW (ref 0.30–0.70)

## 2018-11-19 LAB — APTT: aPTT: 53 seconds — ABNORMAL HIGH (ref 24–36)

## 2018-11-19 LAB — HIV ANTIBODY (ROUTINE TESTING W REFLEX): HIV Screen 4th Generation wRfx: NONREACTIVE

## 2018-11-19 SURGERY — ESOPHAGOGASTRODUODENOSCOPY (EGD) WITH PROPOFOL
Anesthesia: Monitor Anesthesia Care

## 2018-11-19 MED ORDER — HEPARIN (PORCINE) 25000 UT/250ML-% IV SOLN
1250.0000 [IU]/h | INTRAVENOUS | Status: AC
  Administered 2018-11-20: 1250 [IU]/h via INTRAVENOUS
  Filled 2018-11-19: qty 250

## 2018-11-19 MED ORDER — DEXTROSE-NACL 5-0.9 % IV SOLN
INTRAVENOUS | Status: DC
  Administered 2018-11-19 – 2018-11-22 (×5): via INTRAVENOUS

## 2018-11-19 MED ORDER — SODIUM CHLORIDE 0.9% FLUSH
10.0000 mL | INTRAVENOUS | Status: DC | PRN
  Administered 2018-11-20 – 2018-11-23 (×3): 10 mL
  Filled 2018-11-19 (×3): qty 40

## 2018-11-19 MED ORDER — MAGNESIUM SULFATE 2 GM/50ML IV SOLN
2.0000 g | Freq: Once | INTRAVENOUS | Status: AC
  Administered 2018-11-19: 2 g via INTRAVENOUS
  Filled 2018-11-19: qty 50

## 2018-11-19 MED ORDER — HEPARIN (PORCINE) 25000 UT/250ML-% IV SOLN: 10.0000 [IU]/kg/h | INTRAVENOUS | Status: DC

## 2018-11-19 NOTE — Progress Notes (Signed)
Heparin drip stopped at 0915 for radiology procedure today.

## 2018-11-19 NOTE — H&P (View-Only) (Signed)
Change of plans.  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  It may get done tomorrow or on Wednesday I ordered heparin to get restarted. Can have ice chips. Continue to hold Xarelto

## 2018-11-19 NOTE — Progress Notes (Signed)
ANTICOAGULATION CONSULT NOTE - Initial Consult  Pharmacy Consult for heparin Indication: atrial fibrillation  Allergies  Allergen Reactions  . Prozac [Fluoxetine Hcl] Nausea Only    Made the patient very sick to his stomach  . Morphine And Related Swelling    Pt states he can tolerate oxycodone and hydromorphone  . Penicillins Rash    Has patient had a PCN reaction causing immediate rash, facial/tongue/throat swelling, SOB or lightheadedness with hypotension: Yes Has patient had a PCN reaction causing severe rash involving mucus membranes or skin necrosis: No Has patient had a PCN reaction that required hospitalization: No Has patient had a PCN reaction occurring within the last 10 years: No If all of the above answers are "NO", then may proceed with Cephalosporin use.     Patient Measurements: Height: 6\' 3"  (190.5 cm) Weight: 174 lb (78.9 kg) IBW/kg (Calculated) : 84.5 Heparin Dosing Weight: 78.9 kg  Vital Signs: Temp: 97.6 F (36.4 C) (04/27 0404) Temp Source: Oral (04/27 0404) BP: 142/78 (04/27 0404) Pulse Rate: 60 (04/27 0404)  Labs: Recent Labs    11/18/18 1105 11/18/18 1636 11/18/18 2304 11/19/18 0439  HGB 15.7  --   --  12.9*  HCT 48.8  --   --  40.0  PLT 335  --   --  294  CREATININE 0.88  --   --  0.85  TROPONINI <0.03 <0.03 <0.03 <0.03    Estimated Creatinine Clearance: 94.1 mL/min (by C-G formula based on SCr of 0.85 mg/dL).   Medical History: Past Medical History:  Diagnosis Date  . Acute on chronic pancreatitis ([place]) 05/17/2016  . Alcohol abuse   . Alcoholic pancreatitis   . Anginal pain ([place])   . Anxiety and depression 01/20/2015   PHQ 9 = 11 (01/20/15)    . Arthritis    "eat up w/it" (05/17/2016)  . Atrial fibrillation status post cardioversion ([place]) 08/2014  . Atrial flutter, unspecified   . CHF (congestive heart failure) ([place])   . Chronic anticoagulation   . Chronic atrial fibrillation   . Chronic bronchitis ([place])   . Chronic left-sided  low back pain with sciatica 12/21/2012  . COLD (chronic obstructive lung disease) ([place])   . Daily headache    "need eye examined" (05/17/2016)  . GERD (gastroesophageal reflux disease)   . Hematuria, microscopic 02/13/2015   Noted on UA - Recheck UA at next visit ~ 1 months   . Hyperlipidemia   . Hypertension   . Insomnia 02/11/2016  . Lateral epicondylitis of right elbow 03/17/2017  . Left foot pain 10/12/2013  . Loss of weight 02/19/2018  . Low back pain 12/21/2012  . Malnutrition of moderate degree 05/18/2016  . Neck pain, bilateral posterior 10/15/2014  . Neuropathy 03/16/2015  . Pain of joint of left ankle and foot 06/13/2018  . Pain of right upper extremity 05/30/2016  . Pancreatic pseudocyst    seen on CT scan 07/2014  . Pancreatitis 08/04/2014  . Permanent atrial fibrillation 05/05/2017  . Pneumonia    "?a few times" (05/17/2016)  . 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 Tobacco abuse 05/30/2011   Heavy smoker up to 3 ppd down to 15 cigs per day in 09/2014   . Type II diabetes mellitus ([facility])     Medications:  Scheduled:  . insulin aspart  0-9 Units Subcutaneous TID WC  . metoprolol tartrate  10 mg Intravenous Q6H  . mometasone-formoterol  2 puff Inhalation BID  . pantoprazole (PROTONIX) IV  40 mg Intravenous Q24H  Assessment: 43 yom presenting with worsening abdominal pain and N/V, on Xarelto PTA for hx Afib - last dose on 4/26@0615 .  CT abdomen showing probable pseudocyst and chronic pancreatitis. Hgb 15.7, plt 335. Scr 0.88. No s/sx of bleeding.   Heparin restarted 4/27 AM and discontinued soon after for planned EGD/EUS. Now delayed, so restarting heparin infusion at previous rate. CBC WNL.  Given recent Xarelto dosing, will monitor both aPTT and heparin level until correlate.   Goal of Therapy:  Heparin level 0.3-0.7 units/ml aPTT 66-102 seconds Monitor platelets by anticoagulation protocol: Yes   Plan:  Start heparin infusion at 1100 units/hr Check anti-Xa and aPTT level in  6 hours and daily while on heparin Continue to monitor H&H and platelets  Thank you for involving pharmacy in this patient's care.  Janae Bridgeman, PharmD PGY1 Pharmacy Resident Phone: [phone] 11/19/2018 11:35 AM

## 2018-11-19 NOTE — Progress Notes (Addendum)
Family Medicine Teaching Service Daily Progress Note Intern Pager: [phone]  Patient name: Timothy Martinez Medical record number: [id number] Date of birth: October 11, 1950 Age: 68 y.o. Gender: male  Primary Care Provider: [name], DO Consultants: GI Code Status: Full  Pt Overview and Major Events to Date:  4/26 admitted.   Assessment and Plan: Timothy Martinez is a 68 y.o. male presenting with 1 week history of nausea and vomiting, found to have an enlargement of known pseudocyst with subsequent duodenal obstruction. PMH is significant for Chronic pancreatitis with pseudocyst, type 2 diabetes, COPD, paroxysmal atrial fibrillation s/p cardioversion, GERD, hyperlipidemia, chronic lower back pain, tobacco use, history of solitary pulmonary nodule, and previous splenectomy.   N/V 2/2 obstruction w/ enlarging known pancreatic pseudocyst:  Acute. No N/V since admit, NG tube in place continuing to drain brown liquid.  Dr. Rush Landmark, GI, to evaluate today with considerations for EUS guided drainage. - GI on board, appreciate further recommendations - N.p.o., NG tube in place - D5 NS at 125 mL/hour - Dilaudid 1 mg every 4 as needed -PPI 40 mg IV daily -CMP, CBC -Monitor vitals  Atypical chest pain, associated with above: Resolved. No further chest pains.  EKG NSR this a.m.  Troponin negative x3.    - Vitals per routine - Monitor for symptoms  Pancreatitic duct dilation in setting of chronic pancreatitis: Chronic.  Possible slight enlargement of dilatation at 15-17 mm, measured 15 mm on MRCP in 07/2018.  Chronic in nature, not believed to be secondary to stone.  However, has been following with [facility], who was recommending endoscopic ultrasound in the near future to reliably rule out IPMN. Does not take any pancreatitic supplementation for chronic pancreatitis.  - GI on board, appreciate further recommendations  Type 2 diabetes: Chronic, stable.  A1c 7.2 in 09/2018.   CBG low to 64  overnight, started on D5 fluids.  - SSI if needed - CBG every 4 while n.p.o. - Hold home metformin while admitted  Paroxysmal atrial fib s/p cardioversion: Chronic, stable. Sinus, HR 60-80. Takes Xarelto and metoprolol at home.  Concerns patient has been taking Xarelto incorrectly, noted on med rec that he reports taking Xarelto 20 mg twice daily rather than nightly. - cont treatment dose heparin IV while n.p.o., especially in case of any procedure - Lopressor 10 mg IV every 6, transitioned from home metoprolol 75 twice daily (there is room to increase if needed to true conversion of 15mg ) - Clarify home Xarelto dose  COPD: Chronic, stable. No increased WOB and on RA satting appropriately.  - Continue home Dulera -Albuterol PRN - Discussed importance of taking controller medication on a regular basis  HFpEF: Chronic, stable. EF 55-60% with G1 DD on echo in 12/2014.  Euvolemic on exam. Follows with heart care, last seen in 2019 without concern.  - Monitor fluid status, BP  GERD: Chronic, stable. - Protonix 40 mg IV as above  Hyperlipidemia: Chronic, stable. LDL 27, HDL 26. - Holding home atorvastatin while npo, however decrease to 20mg  on d/c  Chronic lower back pain: Chronic, stable. No current symptomatology. - Monitor for symptoms, on Dilaudid for above  Tobacco use: Chronic. Slowly cutting back, 1 PPD.  Patient declines nicotine patch. - May add on patch as needed - Cessation counseling  Previous alcohol use: Stable. Reports last drink in 02/2018.  Congratulated and encouraged him to keep this up.  Colonic Polyps:  Three sessile polpys resected during colonoscopy in 2019. Recommended f/u colonoscopy this year, 2020,  due to poor bow preparation previousy.  Follows with GI, already has appointment scheduled for this.  FEN/GI: N.p.o. with NG tube, Protonix Prophylaxis: On treatment Heparin w/ h/o afib   Disposition: Admit to medsurg, attending Dr. McDiarmid     Subjective:  Doing "okay " this morning.  Hungry, wants to try eating.  Still having abdominal pains, starting to creep back up the burning sensation in his lower quadrants.    Objective: Temp:  [97.6 F (36.4 C)-98.6 F (37 C)] 97.6 F (36.4 C) (04/27 0404) Pulse Rate:  [59-89] 60 (04/27 0404) Resp:  [2-28] 16 (04/27 0404) BP: (122-146)/(71-93) 142/78 (04/27 0404) SpO2:  [93 %-100 %] 94 % (04/27 0404) Weight:  [78.9 kg] 78.9 kg (04/26 1106) Physical Exam: General: Alert, NAD, thin older gentleman HEENT: NCAT, MMM, NG tube in place, brown liquid draining Cardiac: RRR no m/g/r Lungs: Clear bilaterally, no increased WOB  Abdomen: soft, tender to palpation in left upper/lower quadrants, non-distended, hypoactive BS Msk: Moves all extremities spontaneously  Ext: Warm, dry, 2+ distal pulses, no edema    Laboratory: Recent Labs  Lab 11/18/18 1105 11/19/18 0439  WBC 9.9 8.2  HGB 15.7 12.9*  HCT 48.8 40.0  PLT 335 294   Recent Labs  Lab 11/18/18 1105 11/19/18 0439  NA 137 140  K 3.9 3.7  CL 100 106  CO2 26 25  BUN 16 13  CREATININE 0.88 0.85  CALCIUM 10.0 8.5*  PROT 8.6* 6.5  BILITOT 0.9 0.6  ALKPHOS 93 75  ALT 30 24  AST 28 22  GLUCOSE 119* 100*     Imaging/Diagnostic Tests: Dg Chest 2 View  Result Date: 11/18/2018 CLINICAL DATA:  Epigastric pains.  Chest pain. EXAM: CHEST - 2 VIEW COMPARISON:  Chest x-ray September 17, 2018. Chest CT March 30, 2018. FINDINGS: Chronic interstitial lung disease identified with peripheral reticular markings, unchanged since September 17, 2018. Mildly more focal opacity in the lateral aspect of the left upper lung. No other focal infiltrate. A nodule seen over the right mid lung, better seen on the CT scan from September 2019. No other interval changes in the lungs. The cardiomediastinal silhouette is unremarkable. Hyperinflation of the lungs. IMPRESSION: 1. Chronic interstitial lung disease. 2. Mildly more focal opacity in the  lateral aspect of the left upper lung may represent atelectasis or subtle early infiltrate. Recommend short-term follow-up to ensure resolution. 3. The nodule in the right mid lung was better seen on the CT scan from September 2019. 4. Hyperinflation of the lungs consistent with emphysema. Electronically Signed   By: Dorise Bullion III M.D   On: 11/18/2018 12:14   Ct Abdomen Pelvis W Contrast  Result Date: 11/18/2018 CLINICAL DATA:  Upper abdominal pain, nausea, vomiting and diarrhea. Known pancreatic mass history of pseudocyst and history of acute on chronic pancreatitis. EXAM: CT ABDOMEN AND PELVIS WITH CONTRAST TECHNIQUE: Multidetector CT imaging of the abdomen and pelvis was performed using the standard protocol following bolus administration of intravenous contrast. CONTRAST:  123mL OMNIPAQUE IOHEXOL 300 MG/ML  SOLN COMPARISON:  MRI of the abdomen on 08/20/2018 FINDINGS: Lower chest: Stable severe emphysematous lung disease with peripheral honeycombing. Hepatobiliary: Stable mild dilatation of intrahepatic bile ducts and mild gallbladder distension. No liver lesions. Pancreas: Again noted is diffuse abnormality of the pancreas consisting of irregular dilatation of the pancreatic duct, multiple intraductal calculi and area cystic and soft tissue prominence in the mid body portion of the pancreas. The area mid body soft tissue prominence may be  slightly larger but appears to be mostly due to some increased prominence in ductal dilatation which measures up to 15-17 mm in the body of the pancreas. It is difficult to delineate the duodenal lumen from a potential separate pseudocyst adjacent and predominately superior to the head of the pancreas. This pseudocyst may invaginate into the duodenal wall and appears to cause relative duodenal and gastric outlet obstruction. Area of fluid appears larger than by prior imaging and measures up to 5.6 x 7.3 cm transversely. Spleen: Status post splenectomy.  Adrenals/Urinary Tract: Adrenal glands are unremarkable. Kidneys are normal, without renal calculi, focal lesion, or hydronephrosis. Bladder is unremarkable. Stomach/Bowel: The stomach is distended with fluid. At the level of the distal stomach and proximal duodenum, circumferential wall thickening is present with likely relative outlet obstruction. This is also at the level of the presumed pseudocyst which likely invaginate into the wall of the proximal duodenum. It is somewhat unclear whether this cyst also may communicate with the lumen of the duodenum. No evidence of small-bowel obstruction, colonic dilatation or free intraperitoneal air. Vascular/Lymphatic: Stable atherosclerosis of the abdominal aorta without aneurysm. No enlarged lymph nodes identified. Reproductive: Prostate is unremarkable. Other: No abdominal wall hernia or abnormality. No abdominopelvic ascites. Musculoskeletal: No acute or significant osseous findings. IMPRESSION: 1. Fluid filled and distended stomach with probable relative gastric outlet obstruction secondary to chronic inflammation and mass effect from a probable pseudocyst invaginating into the duodenal wall. This pseudocyst appears larger compared to a prior CT in 2019. 2. Chronic abnormality of the pancreas consisting of marked irregular pancreatic ductal dilatation with areas of calculi and soft tissue prominence. The area solid and cystic prominence in the mid body appears slightly larger, felt to largely be secondary to increased ductal dilatation. Although findings may be secondary to chronic pancreatitis, underlying ductal neoplasm such as IPMN remains in the differential. Electronically Signed   By: Aletta Edouard M.D.   On: 11/18/2018 14:09   Dg Abd Portable 1 View  Result Date: 11/18/2018 CLINICAL DATA:  Worsening abdominal pain, nausea vomiting NG tube placement EXAM: PORTABLE ABDOMEN - 1 VIEW COMPARISON:  CT 11/18/2018 FINDINGS: Peripheral ground-glass opacity at the  bases. Esophageal tube tip folded back upon itself in the region of the gastric fundus. Contrast material within the visible collecting system of the kidneys IMPRESSION: Esophageal tube tip folded back upon itself in the region of the gastric fundus Electronically Signed   By: Donavan Foil M.D.   On: 11/18/2018 15:57    Patriciaann Clan, DO 11/19/2018, 7:11 AM PGY-1, [facility] Intern pager: [phone], text pages welcome

## 2018-11-19 NOTE — Progress Notes (Addendum)
Daily Rounding Note  11/19/2018, 8:15 AM  LOS: 1 day   SUBJECTIVE:   Chief complaint:     Recorded NGT 455 ml.   4 mg total Dilaudid.   Abdominal pain better with NGT.  Now only having twinges of pain in LUQ.  No nausea.  No BM.  Feels better  OBJECTIVE:         Vital signs in last 24 hours:    Temp:  [97.6 F (36.4 C)-98.6 F (37 C)] 97.6 F (36.4 C) (04/27 0404) Pulse Rate:  [59-89] 60 (04/27 0404) Resp:  [2-28] 16 (04/27 0404) BP: (122-146)/(71-93) 142/78 (04/27 0404) SpO2:  [93 %-100 %] 94 % (04/27 0404) Weight:  [78.9 kg] 78.9 kg (04/26 1106) Last BM Date: 11/18/18 Filed Weights   11/18/18 1106  Weight: 78.9 kg   General: looks rough in terms of leathery skin but not ill looking   Heart: RRR.  NSR on Tele Chest: clear bil.   Abdomen: soft, minor LUQ tenderness.  ND.  Active BS.  No effluent in the NGT canister  Extremities: no CCE Neuro/Psych:  Alert.  Oriented x 3.  Talkative.  Fluid speech.  No tremors or weakness  Intake/Output from previous day: 04/26 0701 - 04/27 0700 In: 106.7 [I.V.:106.7] Out: 455 [Emesis/NG output:5]  Intake/Output this shift: No intake/output data recorded.  Lab Results: Recent Labs    11/18/18 1105 11/19/18 0439  WBC 9.9 8.2  HGB 15.7 12.9*  HCT 48.8 40.0  PLT 335 294   BMET Recent Labs    11/18/18 1105 11/19/18 0439  NA 137 140  K 3.9 3.7  CL 100 106  CO2 26 25  GLUCOSE 119* 100*  BUN 16 13  CREATININE 0.88 0.85  CALCIUM 10.0 8.5*   LFT Recent Labs    11/18/18 1105 11/19/18 0439  PROT 8.6* 6.5  ALBUMIN 4.2 3.2*  AST 28 22  ALT 30 24  ALKPHOS 93 75  BILITOT 0.9 0.6   PT/INR No results for input(s): LABPROT, INR in the last 72 hours. Hepatitis Panel No results for input(s): HEPBSAG, HCVAB, HEPAIGM, HEPBIGM in the last 72 hours.  Studies/Results: Dg Chest 2 View  Result Date: 11/18/2018 CLINICAL DATA:  Epigastric pains.  Chest pain. EXAM:  CHEST - 2 VIEW COMPARISON:  Chest x-ray September 17, 2018. Chest CT March 30, 2018. FINDINGS: Chronic interstitial lung disease identified with peripheral reticular markings, unchanged since September 17, 2018. Mildly more focal opacity in the lateral aspect of the left upper lung. No other focal infiltrate. A nodule seen over the right mid lung, better seen on the CT scan from September 2019. No other interval changes in the lungs. The cardiomediastinal silhouette is unremarkable. Hyperinflation of the lungs. IMPRESSION: 1. Chronic interstitial lung disease. 2. Mildly more focal opacity in the lateral aspect of the left upper lung may represent atelectasis or subtle early infiltrate. Recommend short-term follow-up to ensure resolution. 3. The nodule in the right mid lung was better seen on the CT scan from September 2019. 4. Hyperinflation of the lungs consistent with emphysema. Electronically Signed   By: Dorise Bullion III M.D   On: 11/18/2018 12:14   Ct Abdomen Pelvis W Contrast  Result Date: 11/18/2018 CLINICAL DATA:  Upper abdominal pain, nausea, vomiting and diarrhea. Known pancreatic mass history of pseudocyst and history of acute on chronic pancreatitis. EXAM: CT ABDOMEN AND PELVIS WITH CONTRAST TECHNIQUE: Multidetector CT imaging of the abdomen and pelvis  was performed using the standard protocol following bolus administration of intravenous contrast. CONTRAST:  190mL OMNIPAQUE IOHEXOL 300 MG/ML  SOLN COMPARISON:  MRI of the abdomen on 08/20/2018 FINDINGS: Lower chest: Stable severe emphysematous lung disease with peripheral honeycombing. Hepatobiliary: Stable mild dilatation of intrahepatic bile ducts and mild gallbladder distension. No liver lesions. Pancreas: Again noted is diffuse abnormality of the pancreas consisting of irregular dilatation of the pancreatic duct, multiple intraductal calculi and area cystic and soft tissue prominence in the mid body portion of the pancreas. The area mid body  soft tissue prominence may be slightly larger but appears to be mostly due to some increased prominence in ductal dilatation which measures up to 15-17 mm in the body of the pancreas. It is difficult to delineate the duodenal lumen from a potential separate pseudocyst adjacent and predominately superior to the head of the pancreas. This pseudocyst may invaginate into the duodenal wall and appears to cause relative duodenal and gastric outlet obstruction. Area of fluid appears larger than by prior imaging and measures up to 5.6 x 7.3 cm transversely. Spleen: Status post splenectomy. Adrenals/Urinary Tract: Adrenal glands are unremarkable. Kidneys are normal, without renal calculi, focal lesion, or hydronephrosis. Bladder is unremarkable. Stomach/Bowel: The stomach is distended with fluid. At the level of the distal stomach and proximal duodenum, circumferential wall thickening is present with likely relative outlet obstruction. This is also at the level of the presumed pseudocyst which likely invaginate into the wall of the proximal duodenum. It is somewhat unclear whether this cyst also may communicate with the lumen of the duodenum. No evidence of small-bowel obstruction, colonic dilatation or free intraperitoneal air. Vascular/Lymphatic: Stable atherosclerosis of the abdominal aorta without aneurysm. No enlarged lymph nodes identified. Reproductive: Prostate is unremarkable. Other: No abdominal wall hernia or abnormality. No abdominopelvic ascites. Musculoskeletal: No acute or significant osseous findings. IMPRESSION: 1. Fluid filled and distended stomach with probable relative gastric outlet obstruction secondary to chronic inflammation and mass effect from a probable pseudocyst invaginating into the duodenal wall. This pseudocyst appears larger compared to a prior CT in 2019. 2. Chronic abnormality of the pancreas consisting of marked irregular pancreatic ductal dilatation with areas of calculi and soft tissue  prominence. The area solid and cystic prominence in the mid body appears slightly larger, felt to largely be secondary to increased ductal dilatation. Although findings may be secondary to chronic pancreatitis, underlying ductal neoplasm such as IPMN remains in the differential. Electronically Signed   By: Aletta Edouard M.D.   On: 11/18/2018 14:09   Dg Abd Portable 1 View  Result Date: 11/18/2018 CLINICAL DATA:  Worsening abdominal pain, nausea vomiting NG tube placement EXAM: PORTABLE ABDOMEN - 1 VIEW COMPARISON:  CT 11/18/2018 FINDINGS: Peripheral ground-glass opacity at the bases. Esophageal tube tip folded back upon itself in the region of the gastric fundus. Contrast material within the visible collecting system of the kidneys IMPRESSION: Esophageal tube tip folded back upon itself in the region of the gastric fundus Electronically Signed   By: Donavan Foil M.D.   On: 11/18/2018 15:57   Scheduled Meds: . insulin aspart  0-9 Units Subcutaneous TID WC  . metoprolol tartrate  10 mg Intravenous Q6H  . mometasone-formoterol  2 puff Inhalation BID  . pantoprazole (PROTONIX) IV  40 mg Intravenous Q24H   Continuous Infusions: . dextrose 5 % and 0.9% NaCl 125 mL/hr at 11/19/18 0126  . heparin 1,100 Units/hr (11/19/18 0534)   PRN Meds:.HYDROmorphone (DILAUDID) injection, nitroGLYCERIN, sodium chloride flush  ASSESMENT:   *   Pancreatic pseudocyst at [facility] causing GOO.  Hx pseudocysts dating to 2016.   Dr Rush Landmark contemplating EUS as of OV 09/11/18.    *   Chronic calcific pancreatitis.  Multiple calcifications of pancreas, PD prominent.   Stable mild dilation intrhepatic cucts.    *  ETOH abuse.  Abstinent since 02/2018  *   afib RVR.  Previous cardioversion.  Xarelto on hold.  IV Heparin in place.   NSR currently.    *   DM2. Oral meds at home, prn insulin currently.     *   04/2018 Colonoscopy.  Poor prep, 3 polyps (2 sessile serrated, 1 HP), hemorrhoids.  Short interval repeat study  indicated due to the polyps and poor prep/incomplete visualization.      PLAN   *   Set up for EGD/EUS with Dr Rush Landmark this afternoon.  Need to stop Heparin ahead of time.  Will stop now.         Azucena Freed  11/19/2018, 8:15 AM Phone [phone]   Attending physician's note   I have taken an interval history, reviewed the chart and examined the patient. I agree with the Advanced Practitioner's note, impression and recommendations.   H/o alcohol abuse with chronic calcified pancreatitis and PD dilation..  Pseudocyst with gastric outlet obstruction.  NG tube with gastric decompression. He has not vomited since admission yesterday. Plan for EUS, possible cyst gastrostomy tomorrow by Dr Rush Landmark. Will need to consider post pyloric feeding tube to improve nutritional status Ok to continue with sips of water and ice chips NPO after midnight  K. Denzil Magnuson , MD [phone]

## 2018-11-19 NOTE — Social Work (Signed)
CSW acknowledging consult that "Chart says patient has legal guardian? Seems to have capacity on our exam." It appears this was entered by an Therapist, sports during an admission in 2018. Often times this can be incorrectly entered onto patient's charts when they have family/friends that assist with care/decision making. No legal documentation noted in the system regarding any guardian and no paperwork exists in the media section of Epic. Should pt physicians need more information they should contact appropriate next of kin.  CSW signing off. Please consult if any additional needs arise.  Alexander Mt, [place] Work [phone]

## 2018-11-19 NOTE — Progress Notes (Signed)
ANTICOAGULATION CONSULT NOTE - Initial Consult  Pharmacy Consult for heparin Indication: atrial fibrillation  Allergies  Allergen Reactions  . Prozac [Fluoxetine Hcl] Nausea Only    Made the patient very sick to his stomach  . Morphine And Related Swelling    Pt states he can tolerate oxycodone and hydromorphone  . Penicillins Rash    Has patient had a PCN reaction causing immediate rash, facial/tongue/throat swelling, SOB or lightheadedness with hypotension: Yes Has patient had a PCN reaction causing severe rash involving mucus membranes or skin necrosis: No Has patient had a PCN reaction that required hospitalization: No Has patient had a PCN reaction occurring within the last 10 years: No If all of the above answers are "NO", then may proceed with Cephalosporin use.     Patient Measurements: Height: 6\' 3"  (190.5 cm) Weight: 174 lb (78.9 kg) IBW/kg (Calculated) : 84.5 Heparin Dosing Weight: 78.9 kg  Vital Signs: Temp: 97.9 F (36.6 C) (04/27 1958) Temp Source: Oral (04/27 1958) BP: 144/73 (04/27 1958) Pulse Rate: 60 (04/27 1958)  Labs: Recent Labs    11/18/18 1105 11/18/18 1636 11/18/18 2304 11/19/18 0439 11/19/18 2050  HGB 15.7  --   --  12.9*  --   HCT 48.8  --   --  40.0  --   PLT 335  --   --  294  --   APTT  --   --   --   --  53*  HEPARINUNFRC  --   --   --   --  0.20*  CREATININE 0.88  --   --  0.85  --   TROPONINI <0.03 <0.03 <0.03 <0.03  --     Estimated Creatinine Clearance: 94.1 mL/min (by C-G formula based on SCr of 0.85 mg/dL).  Assessment: 32 yom presenting with worsening abdominal pain and N/V, on Xarelto PTA for hx Afib - last dose on 4/26@0615 .  CT abdomen showing probable pseudocyst and chronic pancreatitis. Hgb 15.7, plt 335. Scr 0.88. No s/sx of bleeding.   Heparin restarted 4/27 AM and discontinued soon after for planned EGD/EUS. Now delayed, so restarting heparin infusion at previous rate. CBC WNL.  Given recent Xarelto dosing, will  monitor both aPTT and heparin level until correlate.   Hep lvl this even 0.2 and aPTT 53 - correlate but low  Goal of Therapy:  Heparin level 0.3-0.7 units/ml aPTT 66-102 seconds Monitor platelets by anticoagulation protocol: Yes   Plan:  Increase heparin infusion to 1250 units/hr Hep off at 0630 tomorrow for procedure so no need for repeat level F/u restart  Levester Fresh, PharmD, BCPS, BCCCP Clinical Pharmacist [phone]  Please check AMION for all [place] numbers  11/19/2018 9:45 PM

## 2018-11-19 NOTE — Progress Notes (Signed)
Change of plans.  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  It may get done tomorrow or on Wednesday I ordered heparin to get restarted. Can have ice chips. Continue to hold Xarelto

## 2018-11-19 NOTE — Progress Notes (Signed)
Initial Nutrition Assessment  RD working remotely.  DOCUMENTATION CODES:   Not applicable  INTERVENTION:   -RD will follow for diet advancement and supplement as appropriate  NUTRITION DIAGNOSIS:   Increased nutrient needs related to chronic illness(chronic pancreatitis) as evidenced by estimated needs.  GOAL:   Patient will meet greater than or equal to 90% of their needs  MONITOR:   Diet advancement, Labs, Weight trends, Skin, I & O's  REASON FOR ASSESSMENT:   Malnutrition Screening Tool    ASSESSMENT:      Pt admitted with nausea and vomiting secondary to obstruction with enlarging known pancreatic pseudocyst.   4/26- CT of abdomen revealed distended stomach with GOO 2/2 chronic inflammation and mass effect of pseudocyst. NGT placed, connected to low-intermittent suction  Reviewed I/O's: -348 ml x 24 hours  NGT output: 5 ml x 24 hours  UOP: 500 ml x 24 hours  Per GI notes, plan for EGD/EUS, likely for tomorrow or Wednesday (11/20/18 or 11/21/18).   Per H&P, pt with poor oral intake over the past week, secondary to nausea and vomiting.   Reviewed wt hx; noted wt has been stable over the past 6 months (pt with 2.2% wt gain over the past 6 months).   Lab Results  Component Value Date   HGBA1C 7.2 (A) 10/01/2018   PTA DM medications are 1000 mg metformin BID.   Labs reviewed: CBGS: 64-95 (inpatient orders for glycemic control are 0-9 units insulin aspart TID with meals).   NUTRITION - FOCUSED PHYSICAL EXAM:    Most Recent Value  Orbital Region  Unable to assess  Upper Arm Region  Unable to assess  Thoracic and Lumbar Region  Unable to assess  Buccal Region  Unable to assess  Temple Region  Unable to assess  Clavicle Bone Region  Unable to assess  Clavicle and Acromion Bone Region  Unable to assess  Scapular Bone Region  Unable to assess  Dorsal Hand  Unable to assess  Patellar Region  Unable to assess  Anterior Thigh Region  Unable to assess   Posterior Calf Region  Unable to assess  Edema (RD Assessment)  Unable to assess  Hair  Unable to assess  Eyes  Unable to assess  Mouth  Unable to assess  Skin  Unable to assess  Nails  Unable to assess       Diet Order:   Diet Order            Diet NPO time specified Except for: Sips with Meds, Ice Chips  Diet effective now              EDUCATION NEEDS:   Not appropriate for education at this time  Skin:  Skin Assessment: Reviewed RN Assessment  Last BM:  11/18/18  Height:   Ht Readings from Last 1 Encounters:  11/18/18 6\' 3"  (1.905 m)    Weight:   Wt Readings from Last 1 Encounters:  11/18/18 78.9 kg    Ideal Body Weight:  89.1 kg  BMI:  Body mass index is 21.75 kg/m.  Estimated Nutritional Needs:   Kcal:  3845-3646  Protein:  120-135 grams  Fluid:  > 2.2 L    Tamieka Rancourt A. Jimmye Norman, RD, LDN, [place] Registered Dietitian II Certified Diabetes Care and Education Specialist Pager: [phone] After hours Pager: [phone]

## 2018-11-20 ENCOUNTER — Encounter (HOSPITAL_COMMUNITY)
Admission: EM | Disposition: A | Discharge status: Home / Self Care | Payer: Self-pay | Source: Home / Self Care | Attending: Family Medicine

## 2018-11-20 ENCOUNTER — Encounter (HOSPITAL_COMMUNITY): Payer: Self-pay | Admitting: Certified Registered Nurse Anesthetist

## 2018-11-20 ENCOUNTER — Inpatient Hospital Stay (HOSPITAL_COMMUNITY): Payer: Medicare Other | Admitting: Certified Registered Nurse Anesthetist

## 2018-11-20 DIAGNOSIS — R109 Unspecified abdominal pain: Secondary | ICD-10-CM

## 2018-11-20 DIAGNOSIS — K5669 Other partial intestinal obstruction: Secondary | ICD-10-CM

## 2018-11-20 DIAGNOSIS — K315 Obstruction of duodenum: Secondary | ICD-10-CM

## 2018-11-20 DIAGNOSIS — J439 Emphysema, unspecified: Secondary | ICD-10-CM

## 2018-11-20 DIAGNOSIS — E119 Type 2 diabetes mellitus without complications: Secondary | ICD-10-CM

## 2018-11-20 DIAGNOSIS — I4891 Unspecified atrial fibrillation: Secondary | ICD-10-CM

## 2018-11-20 DIAGNOSIS — T182XXA Foreign body in stomach, initial encounter: Secondary | ICD-10-CM

## 2018-11-20 HISTORY — PX: BIOPSY: SHX5522

## 2018-11-20 HISTORY — PX: FOREIGN BODY REMOVAL: SHX962

## 2018-11-20 HISTORY — PX: ESOPHAGOGASTRODUODENOSCOPY (EGD) WITH PROPOFOL: SHX5813

## 2018-11-20 HISTORY — PX: UPPER ESOPHAGEAL ENDOSCOPIC ULTRASOUND (EUS): SHX6562

## 2018-11-20 LAB — COMPREHENSIVE METABOLIC PANEL
ALT: 22 U/L (ref 0–44)
AST: 21 U/L (ref 15–41)
Albumin: 3.4 g/dL — ABNORMAL LOW (ref 3.5–5.0)
Alkaline Phosphatase: 82 U/L (ref 38–126)
Anion gap: 10 (ref 5–15)
BUN: <5 mg/dL — ABNORMAL LOW (ref 8–23)
CO2: 25 mmol/L (ref 22–32)
Calcium: 9 mg/dL (ref 8.9–10.3)
Chloride: 104 mmol/L (ref 98–111)
Creatinine, Ser: 0.68 mg/dL (ref 0.61–1.24)
GFR calc Af Amer: >60 mL/min (ref >60)
GFR calc non Af Amer: >60 mL/min (ref >60)
Glucose, Bld: 95 mg/dL (ref 70–99)
Potassium: 4 mmol/L (ref 3.5–5.1)
Sodium: 139 mmol/L (ref 135–145)
Total Bilirubin: 0.8 mg/dL (ref 0.3–1.2)
Total Protein: 6.7 g/dL (ref 6.5–8.1)

## 2018-11-20 LAB — CBC
HCT: 40.5 % (ref 39.0–52.0)
Hemoglobin: 13.2 g/dL (ref 13.0–17.0)
MCH: 29.7 pg (ref 26.0–34.0)
MCHC: 32.6 g/dL (ref 30.0–36.0)
MCV: 91 fL (ref 80.0–100.0)
Platelets: 289 10*3/uL (ref 150–400)
RBC: 4.45 MIL/uL (ref 4.22–5.81)
RDW: 14.4 % (ref 11.5–15.5)
WBC: 9.5 10*3/uL (ref 4.0–10.5)
nRBC: 0 % (ref 0.0–0.2)

## 2018-11-20 LAB — GLUCOSE, CAPILLARY
Glucose-Capillary: 105 mg/dL — ABNORMAL HIGH (ref 70–99)
Glucose-Capillary: 98 mg/dL (ref 70–99)
Glucose-Capillary: 129 mg/dL — ABNORMAL HIGH (ref 70–99)
Glucose-Capillary: 127 mg/dL — ABNORMAL HIGH (ref 70–99)

## 2018-11-20 LAB — MRSA PCR SCREENING: MRSA by PCR: NEGATIVE

## 2018-11-20 LAB — MAGNESIUM: Magnesium: 1.7 mg/dL (ref 1.7–2.4)

## 2018-11-20 SURGERY — ESOPHAGOGASTRODUODENOSCOPY (EGD) WITH PROPOFOL
Anesthesia: General

## 2018-11-20 MED ORDER — PHENYLEPHRINE HCL (PRESSORS) 10 MG/ML IV SOLN
INTRAVENOUS | Status: DC | PRN
  Administered 2018-11-20 (×3): 80 ug via INTRAVENOUS
  Administered 2018-11-20: 120 ug via INTRAVENOUS
  Administered 2018-11-20 (×3): 80 ug via INTRAVENOUS

## 2018-11-20 MED ORDER — PROPOFOL 10 MG/ML IV BOLUS
INTRAVENOUS | Status: DC | PRN
  Administered 2018-11-20: 150 mg via INTRAVENOUS
  Administered 2018-11-20: 30 mg via INTRAVENOUS

## 2018-11-20 MED ORDER — ONDANSETRON HCL 4 MG/2ML IJ SOLN
INTRAMUSCULAR | Status: DC | PRN
  Administered 2018-11-20: 4 mg via INTRAVENOUS

## 2018-11-20 MED ORDER — LACTATED RINGERS IV SOLN
INTRAVENOUS | Status: DC
  Administered 2018-11-20: 12:00:00 via INTRAVENOUS

## 2018-11-20 MED ORDER — CIPROFLOXACIN IN D5W 400 MG/200ML IV SOLN
INTRAVENOUS | Status: AC
  Filled 2018-11-20: qty 200

## 2018-11-20 MED ORDER — GLYCOPYRROLATE 0.2 MG/ML IJ SOLN
INTRAMUSCULAR | Status: DC | PRN
  Administered 2018-11-20: 0.2 mg via INTRAVENOUS

## 2018-11-20 MED ORDER — LORAZEPAM 2 MG/ML IJ SOLN: 1.0000 mg | Freq: Four times a day (QID) | INTRAMUSCULAR | Status: DC | PRN

## 2018-11-20 MED ORDER — DEXAMETHASONE SODIUM PHOSPHATE 4 MG/ML IJ SOLN
INTRAMUSCULAR | Status: DC | PRN
  Administered 2018-11-20: 5 mg via INTRAVENOUS

## 2018-11-20 MED ORDER — LORAZEPAM 1 MG PO TABS: 1.0000 mg | ORAL_TABLET | Freq: Four times a day (QID) | ORAL | Status: DC | PRN

## 2018-11-20 MED ORDER — FENTANYL CITRATE (PF) 100 MCG/2ML IJ SOLN
INTRAMUSCULAR | Status: DC | PRN
  Administered 2018-11-20: 50 ug via INTRAVENOUS

## 2018-11-20 MED ORDER — SUCCINYLCHOLINE CHLORIDE 20 MG/ML IJ SOLN
INTRAMUSCULAR | Status: DC | PRN
  Administered 2018-11-20: 120 mg via INTRAVENOUS

## 2018-11-20 MED ORDER — HEPARIN (PORCINE) 25000 UT/250ML-% IV SOLN
1600.0000 [IU]/h | INTRAVENOUS | Status: DC
  Administered 2018-11-20: 1250 [IU]/h via INTRAVENOUS
  Administered 2018-11-21: 1400 [IU]/h via INTRAVENOUS
  Filled 2018-11-20 (×2): qty 250

## 2018-11-20 MED ORDER — LIDOCAINE HCL (CARDIAC) PF 100 MG/5ML IV SOSY
PREFILLED_SYRINGE | INTRAVENOUS | Status: DC | PRN
  Administered 2018-11-20: 60 mg via INTRAVENOUS

## 2018-11-20 MED ORDER — SODIUM CHLORIDE 0.9 % IV SOLN: INTRAVENOUS | Status: DC

## 2018-11-20 SURGICAL SUPPLY — 15 items

## 2018-11-20 NOTE — Transfer of Care (Signed)
Immediate Anesthesia Transfer of Care Note  Patient: Lyndal Rainbow  Procedure(s) Performed: ESOPHAGOGASTRODUODENOSCOPY (EGD) WITH PROPOFOL (N/A ) UPPER ESOPHAGEAL ENDOSCOPIC ULTRASOUND (EUS) (N/A ) FOREIGN BODY REMOVAL BIOPSY  Patient Location: Endoscopy Unit  Anesthesia Type:General  Level of Consciousness: awake, alert  and oriented  Airway & Oxygen Therapy: Patient Spontanous Breathing and Patient connected to nasal cannula oxygen  Post-op Assessment: Report given to RN and Post -op Vital signs reviewed and stable  Post vital signs: Reviewed and stable  Last Vitals:  Vitals Value Taken Time  BP    Temp    Pulse    Resp    SpO2      Last Pain:  Vitals:   11/20/18 1211  TempSrc: Oral  PainSc: 0-No pain      Patients Stated Pain Goal: 0 (31/42/76 7011)  Complications: No apparent anesthesia complications

## 2018-11-20 NOTE — Anesthesia Preprocedure Evaluation (Addendum)
Anesthesia Evaluation  Patient identified by MRN, date of birth, ID band Patient awake    Reviewed: Allergy & Precautions, NPO status , Patient's Chart, lab work & pertinent test results  Airway Mallampati: II  TM Distance: >3 FB Neck ROM: Full    Dental  (+) Edentulous Upper, Edentulous Lower   Pulmonary Current Smoker,     + decreased breath sounds      Cardiovascular hypertension,  Rhythm:Regular Rate:Normal     Neuro/Psych    GI/Hepatic   Endo/Other  diabetes  Renal/GU      Musculoskeletal   Abdominal   Peds  Hematology   Anesthesia Other Findings   Reproductive/Obstetrics                             Anesthesia Physical Anesthesia Plan  ASA: III  Anesthesia Plan: General   Post-op Pain Management:    Induction: Intravenous  PONV Risk Score and Plan: Ondansetron  Airway Management Planned: Oral ETT  Additional Equipment:   Intra-op Plan:   Post-operative Plan: Extubation in OR  Informed Consent: I have reviewed the patients History and Physical, chart, labs and discussed the procedure including the risks, benefits and alternatives for the proposed anesthesia with the patient or authorized representative who has indicated his/her understanding and acceptance.       Plan Discussed with: CRNA and Anesthesiologist  Anesthesia Plan Comments:        Anesthesia Quick Evaluation

## 2018-11-20 NOTE — Progress Notes (Signed)
Heparin drip stopped.

## 2018-11-20 NOTE — Progress Notes (Signed)
ANTICOAGULATION CONSULT NOTE - Initial Consult  Pharmacy Consult for heparin Indication: atrial fibrillation  Allergies  Allergen Reactions  . Prozac [Fluoxetine Hcl] Nausea Only    Made the patient very sick to his stomach  . Morphine And Related Swelling    Pt states he can tolerate oxycodone and hydromorphone  . Penicillins Rash    Has patient had a PCN reaction causing immediate rash, facial/tongue/throat swelling, SOB or lightheadedness with hypotension: Yes Has patient had a PCN reaction causing severe rash involving mucus membranes or skin necrosis: No Has patient had a PCN reaction that required hospitalization: No Has patient had a PCN reaction occurring within the last 10 years: No If all of the above answers are "NO", then may proceed with Cephalosporin use.     Patient Measurements: Height: 6\' 3"  (190.5 cm) Weight: 174 lb (78.9 kg) IBW/kg (Calculated) : 84.5 Heparin Dosing Weight: 78.9 kg  Vital Signs: Temp: 98.2 F (36.8 C) (04/28 2006) Temp Source: Oral (04/28 2006) BP: 144/80 (04/28 2006) Pulse Rate: 88 (04/28 2006)  Labs: Recent Labs    11/18/18 1105 11/18/18 1636 11/18/18 2304 11/19/18 0439 11/19/18 2050 11/20/18 0429  HGB 15.7  --   --  12.9*  --  13.2  HCT 48.8  --   --  40.0  --  40.5  PLT 335  --   --  294  --  289  APTT  --   --   --   --  53*  --   HEPARINUNFRC  --   --   --   --  0.20*  --   CREATININE 0.88  --   --  0.85  --  0.68  TROPONINI <0.03 <0.03 <0.03 <0.03  --   --     Estimated Creatinine Clearance: 100 mL/min (by C-G formula based on SCr of 0.68 mg/dL).  Assessment: 79 yom presenting with worsening abdominal pain and N/V, on Xarelto PTA for hx Afib - last dose on 4/26@0615 . CT abdomen showing probable pseudocyst and chronic pancreatitis  Patient had EGD/EUS 4/28, heparin restart at 2100 per on call MD, Dr. Peyton Bottoms.   Hgb 13.2, plt 289. No s/sx of bleeding noted and confirmed with nursing.    Goal of Therapy:  Heparin  level 0.3-0.7 units/ml aPTT 66-102 seconds Monitor platelets by anticoagulation protocol: Yes   Plan:  Initiate heparin infusion at 1250 units/hr at 2100  Anti-Xa and aPTT level in 6 hours Monitor daily CBC, anti-Xa, signs/symptoms of bleeding F/u transition to home [place], Sherian Rein D PGY1 Pharmacy Resident  Phone [phone] Please use AMION for clinical pharmacists numbers  11/20/2018      8:19 PM

## 2018-11-20 NOTE — Anesthesia Procedure Notes (Signed)
Procedure Name: Intubation Date/Time: 11/20/2018 1:20 PM Performed by: Oletta Lamas, CRNA Pre-anesthesia Checklist: Patient identified, Emergency Drugs available, Suction available and Patient being monitored Patient Re-evaluated:Patient Re-evaluated prior to induction Oxygen Delivery Method: Circle System Utilized Preoxygenation: Pre-oxygenation with 100% oxygen Induction Type: IV induction Ventilation: Mask ventilation without difficulty Laryngoscope Size: Mac and 4 Grade View: Grade I Tube type: Oral Tube size: 7.5 mm Number of attempts: 1 Airway Equipment and Method: Stylet Placement Confirmation: ETT inserted through vocal cords under direct vision,  positive ETCO2 and breath sounds checked- equal and bilateral Secured at: 22 cm Tube secured with: Tape Dental Injury: Teeth and Oropharynx as per pre-operative assessment

## 2018-11-20 NOTE — Op Note (Addendum)
[facility] Patient Name: Timothy Martinez Procedure Date : 11/20/2018 MRN: [id number] Attending MD: Justice Britain , MD Date of Birth: [birth year] CSN: [id number] Age: 68 Admit Type: Inpatient Procedure:                Upper EUS Indications:              Pancreatic cyst on CT scan, Abnormal                            abdominal/pelvic CT scan, Chronic recurrent                            pancreatitis, Suspected duodenal obstruction, For                            therapy of duodenal obstruction Providers:                Justice Britain, MD, William Dalton,                            Technician, Carlyn Reichert, RN Referring MD:             Blane Ohara McDiarmid MD, MD, Gatha Mayer, MD,                            Mauri Pole, MD Medicines:                General Anesthesia Complications:            No immediate complications. Estimated Blood Loss:     Estimated blood loss was minimal. Procedure:                Pre-Anesthesia Assessment:                           - Prior to the procedure, a History and Physical                            was performed, and patient medications and                            allergies were reviewed. The patient's tolerance of                            previous anesthesia was also reviewed. The risks                            and benefits of the procedure and the sedation                            options and risks were discussed with the patient.                            All questions were answered, and informed consent  was obtained. Prior Anticoagulants: The patient                            last took Xarelto (rivaroxaban) 2 days prior to the                            procedure and last took heparin on the day of the                            procedure. ASA Grade Assessment: III - A patient                            with severe systemic disease. After reviewing the   risks and benefits, the patient was deemed in                            satisfactory condition to undergo the procedure.                           After obtaining informed consent, the endoscope was                            passed under direct vision. Throughout the                            procedure, the patient's blood pressure, pulse, and                            oxygen saturations were monitored continuously. The                            GIF-H190 ([id number]) Olympus gastroscope was                            introduced through the mouth, and advanced to the                            second part of duodenum. The GF-UTC180 ([id number])                            Olympus Linear EUS scope was introduced through the                            mouth, and advanced to the second part of duodenum.                            The [id number]-[id number] ([id number]) [place] was                            introduced through the mouth, and advanced to the                            duodenum for ultrasound examination from the  stomach and duodenum. The upper EUS was                            accomplished without difficulty. The patient                            tolerated the procedure. Scope In: Scope Out: Findings:      ENDOSCOPIC FINDING: :      LA Grade B (one or more mucosal breaks greater than 5 mm, not extending       between the tops of two mucosal folds) esophagitis with no bleeding was       found in the mid esophagus - likely related to recent NGT.      No other gross lesions were noted in the entire esophagus.      A large amount of food (residue) was found in the entire examined       stomach. Removal of food was accomplished with a Jabier Mutton net over the       course of 20 minutes.      Multiple dispersed small erosions were found in the gastric antrum.      One non-bleeding linear gastric ulcer with a clean ulcer base (Forrest       Class III) was found at  the pylorus. The lesion was 7 mm in largest       dimension.      Biopsies were taken with a cold forceps in the cardia, on the greater       curvature of the stomach, on the lesser curvature of the stomach, at the       incisura and in the gastric antrum for histology and Helicobacter pylori       testing.      A severe duodenal dilation deformity was found in the duodenal bulb.      Food (residue) was found within the duodenal bulb. Removal of food was       accomplished with a Jabier Mutton net over the course of 15 minutes.      An acquired benign-appearing, intrinsic moderate stenosis was found in       the D1/D2 angle. The endoscope and the duodenoscope were able to       traverse this region gently.      Diffuse severely congested mucosa without active bleeding and with no       stigmata of bleeding was found at the ampullary region and in the second       portion of the duodenum. Biopsies were taken with a cold forceps for       histology.      No gross lesions were noted in the distal second portion of the duodenum       and in the third portion of the duodenum.      ENDOSONOGRAPHIC FINDING: :      A septated lesion suggestive of a cyst was identified in the pancreatic       head. It was felt to have a possible communication to the main       pancreatic duct. The lesion measured 247 mm by 19.5 mm in maximal       cross-sectional diameter. There was a single compartment thinly       septated. There was as described below associated mural nodularity in       the wall of the pancreatic  duct adjacent to the cyst and there was       internal debris within the fluid-filled cavity. There is some       vasculature in this region.      The pancreatic duct had 2 intraductal lesions that were non-shadowing in       the region of the transition of the pancreatic duct in the genu to the       head. These could be non-shadowing stones but I have concern as noted       above for mural nodularity  within the duct. The largest of these nodules       was 11.5 mm by 14.0 mm.      The pancreatic duct had an intraductal lesion that was non-shadowing in       the genu of the pancreas. This is adjacent to the lesion noted below.       Concern for possible mural nodularity vs non-shadowing intraductal stone.      An irregular lesion was identified in the pancreatic neck. This area was       hypoechoic and measured 17.1 mm by 18.5 mm in maximal cross-sectional       diameter. The endosonographic borders were poorly-defined. This is not       clearly a mass and due to shadowing in the region complete evaluation is       difficult.      The pancreatic duct had a dilated endosonographic appearance, had       intraductal stones noted within the head as well as the neck, had an       irregularly contoured endosonographic appearance and had a prominently       branched endosonographic appearance in the pancreatic head, genu of the       pancreas, body of the pancreas and tail of the pancreas. The pancreatic       duct in the head measured 6 mm -> 8 mm. The pancretic duct in the neck       measured 12.7 mm -> 8.9 mm. The pancreatic duct in the body measured 6.9       mm. The pancreatic duct in the tail measured 6.1 mm.      Pancreatic parenchymal abnormalities were noted in the entire pancreas.       These consisted of atrophy, hyperechoic foci with shadowing, lobularity       with honeycombing and hyperechoic strands.      Moderate hyperechoic material consistent with sludge was visualized       endosonographically in the gallbladder body.      A few benign-appearing lymph nodes were visualized in the peripancreatic       region. The largest measured 6 mm by 8 mm in maximal cross-sectional       diameter. The nodes were triangular, isoechoic and had well defined       margins.      Endosonographic imaging in the visualized portion of the liver showed no       mass.      The celiac region was  visualized.      The biliary duct was difficult to visualize due to the dudoenal bulb       deformity.      Due to the foodstuffs that were removed and concern for possible       infectious risk, decision made not to pursue FNA today. Impression:  EGD Impression:                           - LA Grade B esophagitis.                           - A large amount of food (residue) in the stomach.                            Removal was successful.                           - Erosive gastropathy. Non-bleeding gastric ulcer                            with a clean ulcer base (Forrest Class III).                            Biopsies were taken with a cold forceps for                            histology and Helicobacter pylori testing.                           - Duodenal deformity with dilation. Retained food                            in the duodenum. Removal was successful.                           - Acquired duodenal stenosis at D1/D2 sweep.                            Traveresed with EGD/Duodenoscope.                           - Congested duodenal mucosa at ampullary region.                            Biopsied.                           - No gross lesions in the second portion of the                            duodenum and in the third portion of the duodenum.                           EUS Impression:                           - A cystic lesion was seen in the pancreatic head                            with associated nodularity within a component of  the pancreatic duct - query mural nodularity.                            Tissue has not been obtained. However, the                            endosonographic appearance is suspicious for                            dilated PD vs IPMN-MD.                           - The pancreatic duct had 2 intraductal lesions                            that were non-shadowing in the pancreatic head and                             genu of the pancreas - queyr mural nodularity.                           - The pancreatic duct had an intraductal lesion                            that was non-shadowing in the genu of the pancreas                            - query mural nodule.                           - A [place] identified in the pancreatic neck.                           - The pancreatic duct had a dilated endosonographic                            appearance, had intraductal stones, had an                            irregularly contoured endosonographic appearance                            and had a prominently branched endosonographic                            appearance in the pancreatic head, genu of the                            pancreas, body of the pancreas and tail of the                            pancreas.                           -  Pancreatic parenchymal abnormalities consisting                            of atrophy, hyperechoic foci, lobularity with                            honeycombing and hyperechoic strands were noted in                            the entire pancreas.                           - Hyperechoic material consistent with sludge was                            visualized endosonographically in the gallbladder                            body.                           - A few benign lymph nodes were visualized in the                            peripancreatic region. Tissue has not been                            obtained. However, the endosonographic appearance                            is consistent with benign inflammatory changes. Recommendation:           - The patient will be observed post-procedure,                            until all discharge criteria are met.                           - Return patient to hospital ward for ongoing care.                           - Clear liquid diet.                           - Observe patient's clinical course.                           -  OK to restart Heparin in 6 hours without bolus                            (around 9 PM).                           - Plan to maintain patient on clear liquid diet for  now and on Thursday 4/30 plan to repeat attempt EUS                            with sampling of the pancreatic duct nodularity as                            well as potentially the region in the neck. Heparin                            to be off 6 hours prior to procedure and GI will                            arrange this.                           - The findings and recommendations were discussed                            with the patient.                           - The findings and recommendations were discussed                            with the patient's family.                           - The findings and recommendations were discussed                            with the referring physician.                           - After completion of the examination, the patient                            was found to be delirious after awakening from his                            procedure/anesthesia. Security was called but the                            patient after discussion with our staff and with                            his daughter over the phone was able to be calm for                            transport back to the floor. His primary service                            was notified of this as well. Procedure Code(s):        --- Professional ---  45038, Esophagogastroduodenoscopy, flexible,                            transoral; with endoscopic ultrasound examination                            limited to the esophagus, stomach or duodenum, and                            adjacent structures                           43247, Esophagogastroduodenoscopy, flexible,                            transoral; with removal of foreign body(s)                           43239,  Esophagogastroduodenoscopy, flexible,                            transoral; with biopsy, single or multiple Diagnosis Code(s):        --- Professional ---                           K20.9, Esophagitis, unspecified                           T18.2XXA, Foreign body in stomach, initial encounter                           K31.89, Other diseases of stomach and duodenum                           K25.9, Gastric ulcer, unspecified as acute or                            chronic, without hemorrhage or perforation                           T18.3XXA, Foreign body in small intestine, initial                            encounter                           K31.5, Obstruction of duodenum                           K86.2, Cyst of pancreas                           K86.89, Other specified diseases of pancreas                           R93.3, Abnormal findings on diagnostic imaging of  other parts of digestive tract                           K86.9, Disease of pancreas, unspecified                           I89.9, Noninfective disorder of lymphatic vessels                            and lymph nodes, unspecified                           K86.1, Other chronic pancreatitis                           K83.8, Other specified diseases of biliary tract                           R93.5, Abnormal findings on diagnostic imaging of                            other abdominal regions, including retroperitoneum CPT copyright 2019 [employer]. All rights reserved. The codes documented in this report are preliminary and upon coder review may  be revised to meet current compliance requirements. Justice Britain, MD 11/20/2018 9:02:28 PM Number of Addenda: 0

## 2018-11-20 NOTE — Interval H&P Note (Signed)
History and Physical Interval Note:  11/20/2018 1:01 PM  Timothy Martinez  has presented today for surgery, with the diagnosis of Pancreatic pseudocyst causing gastric outlet obstruction.  Chronic calcific pancreatitis..  The various methods of treatment have been discussed with the patient and family. After consideration of risks, benefits and other options for treatment, the patient has consented to  Procedure(s): ESOPHAGOGASTRODUODENOSCOPY (EGD) WITH PROPOFOL (N/A) UPPER ESOPHAGEAL ENDOSCOPIC ULTRASOUND (EUS) (N/A) as a surgical intervention.  The patient's history has been reviewed, patient examined, no change in status, stable for surgery.  I have reviewed the patient's chart and labs.  Questions were answered to the patient's satisfaction.    The risks of EUS including bleeding, infection, aspiration pneumonia and intestinal perforation were discussed as was the possibility it may not give a definitive diagnosis.  If a biopsy of the pancreas is done as part of the EUS, there is an additional risk of pancreatitis at the rate of about 1%.  It was explained that procedure related pancreatitis is typically mild, although can be severe and even life threatening, which is why we do not perform random pancreatic biopsies and only biopsy a lesion we feel is concerning enough to warrant the risk.  Most likely will proceed with sampling via EUS of the previously documented cystic lesion which has enlarged in effort of ensuring this is not an IPMN.  Pseudocyst drainage unlikely but possible via cystenterostomy vs cystgastrostomy.  [employer]

## 2018-11-20 NOTE — Progress Notes (Signed)
Family Medicine Teaching Service Daily Progress Note Intern Pager: [phone]  Patient name: Timothy Martinez Medical record number: [id number] Date of birth: 1951/04/10 Age: 68 y.o. Gender: male  Primary Care Provider: [name], DO Consultants: GI Code Status: Full  Pt Overview and Major Events to Date:  4/26 admitted.   Assessment and Plan: Timothy Martinez is a 68 y.o. male presenting with 1 week history of nausea and vomiting, found to have an enlargement of known pseudocyst with subsequent duodenal obstruction. PMH is significant for Chronic pancreatitis with pseudocyst, type 2 diabetes, COPD, paroxysmal atrial fibrillation s/p cardioversion, GERD, hyperlipidemia, chronic lower back pain, tobacco use, history of solitary pulmonary nodule, and previous splenectomy.   N/V 2/2 obstruction w/ enlarging known pancreatic pseudocyst:  Acute. No N/V since admit, NG tube in place with minimal drainage left.  Dr. Rush Landmark to perform EUS with drainage today, on schedule for 1300.  - GI on board, appreciate recommendations- EUS drainage today as above - N.p.o., NG tube in place -D5 NS at 125 mL/hour -Dilaudid 1 mg every 4 as needed -PPI 40 mg IV daily -CMP, CBC -Monitor vitals  Atypical chest pain, associated with above: Resolved. No further chest pains.  EKG NSR.  Troponin negative x3.    - Vitals per routine - Monitor for symptoms  Pancreatitic duct dilation in setting of chronic pancreatitis: Chronic.  Possible slight enlargement of dilatation at 15-17 mm, measured 15 mm on MRCP in 07/2018.  Chronic in nature, not believed to be secondary to stone.  However, has been following with [facility], who was recommending endoscopic ultrasound in the near future to reliably rule out IPMN. Does not take any pancreatitic supplementation for chronic pancreatitis.  - GI on board, appreciate further recommendations  Type 2 diabetes: Chronic, stable.  A1c 7.2 in 09/2018.   CBG average 100. -  SSI if needed - CBG every 4 while n.p.o. - Hold home metformin while admitted  Paroxysmal atrial fib s/p cardioversion: Chronic, stable. Sinus, HR 60. Takes Xarelto and metoprolol at home.   - Heparin on hold due to procedure - Lopressor 10 mg IV every 6, transitioned from home metoprolol 75 twice daily (there is room to increase if needed to true conversion of 15mg ) - Clarify home Xarelto dose (potentially had been taking BID)   COPD: Chronic, stable. No increased WOB and on RA satting appropriately.  - Continue home Dulera -Albuterol PRN - Discussed importance of taking controller medication on a regular basis  HFpEF: Chronic, stable. EF 55-60% with G1 DD on echo in 12/2014.  Euvolemic on exam. Follows with heart care, last seen in 2019 without concern.  - Monitor fluid status, BP  GERD: Chronic, stable. - Protonix 40 mg IV as above  Hyperlipidemia: Chronic, stable. LDL 27, HDL 26. - Holding home atorvastatin while npo, however decrease to 20mg  on d/c  Chronic lower back pain: Chronic, stable. No current symptomatology. - Monitor for symptoms, on Dilaudid for above  Tobacco use: Chronic. Slowly cutting back, 1 PPD.  Patient declines nicotine patch. - May add on patch as needed - Cessation counseling  Previous alcohol use: Stable. Reports last drink in 02/2018.  Congratulated and encouraged him to keep this up.  Colonic Polyps:  Three sessile polpys resected during colonoscopy in 2019. Recommended f/u colonoscopy this year, 2020, due to poor bow preparation previousy.  Follows with GI, already has appointment scheduled for this.  FEN/GI: N.p.o. with NG tube, Protonix Prophylaxis: On treatment Heparin w/ h/o afib--on  hold for procedure  Disposition:  Pending further GI evaluation   Subjective:  No acute events overnight.  Patient is frustrated and hoping the procedure gets done today, really wants to eat something.  Feels his abdominal pain is now more in  the epigastric region.  No vomiting, diarrhea.  Objective: Temp:  [97.7 F (36.5 C)-97.9 F (36.6 C)] 97.9 F (36.6 C) (04/27 1958) Pulse Rate:  [58-63] 63 (04/28 0626) Resp:  [16-18] 16 (04/27 1958) BP: (133-154)/(72-74) 133/72 (04/28 0626) SpO2:  [95 %-97 %] 97 % (04/27 1958) Physical Exam: General: Alert, NAD, thin older gentleman with NG tube in place HEENT: NCAT, MMM  Cardiac: RRR no m/g/r Lungs: Clear bilaterally, no increased WOB  Abdomen: soft, slightly tender to the epigastric region, non-distended, normoactive BS Msk: Moves all extremities spontaneously  Ext: Warm, dry, 2+ distal pulses, no edema    Laboratory: Recent Labs  Lab 11/18/18 1105 11/19/18 0439 11/20/18 0429  WBC 9.9 8.2 9.5  HGB 15.7 12.9* 13.2  HCT 48.8 40.0 40.5  PLT 335 294 289   Recent Labs  Lab 11/18/18 1105 11/19/18 0439 11/20/18 0429  NA 137 140 139  K 3.9 3.7 4.0  CL 100 106 104  CO2 26 25 25   BUN 16 13 <5*  CREATININE 0.88 0.85 0.68  CALCIUM 10.0 8.5* 9.0  PROT 8.6* 6.5 6.7  BILITOT 0.9 0.6 0.8  ALKPHOS 93 75 82  ALT 30 24 22   AST 28 22 21   GLUCOSE 119* 100* 95     Imaging/Diagnostic Tests: No results found.  Patriciaann Clan, DO 11/20/2018, 7:18 AM PGY-1, [place] Intern pager: [phone], text pages welcome

## 2018-11-20 NOTE — Progress Notes (Signed)
Patient in endoscopy recovery following EUS. Patient woke up agitated and made multiple attempts to get up from bed. Able to settle patient at first. Became progressively more agitated and refused to stay in stretcher, Removed from monitor and IV fluids disconnected at that time. Endo RN x2 at patient side while ambulating in hallway in Endo. Patient states he was imprisoned, that he did not know how he got to this nursing home. Dr. Rush Landmark notified and he came to see patient. Multiple attempts by Endo RNs and Dr. Rush Landmark to reorient and redirect. Unsuccessful and patient became more agitated and combative. Patient attempting to leave unit. Security notified and responded to unit. Call placed to patient daughter by MD, patient spoke with daughter on phone, this calmed him but did not reorient him. Patient realized he was in hospital and asked to go to this room. Patient taken to room with 2 endo RNs and security following. Once in room, patient reoriented x4. Report given to [place], Therapist, sports.

## 2018-11-21 ENCOUNTER — Encounter (HOSPITAL_COMMUNITY): Payer: Self-pay | Admitting: Gastroenterology

## 2018-11-21 DIAGNOSIS — K209 Esophagitis, unspecified without bleeding: Secondary | ICD-10-CM

## 2018-11-21 DIAGNOSIS — E11649 Type 2 diabetes mellitus with hypoglycemia without coma: Secondary | ICD-10-CM

## 2018-11-21 DIAGNOSIS — K259 Gastric ulcer, unspecified as acute or chronic, without hemorrhage or perforation: Secondary | ICD-10-CM | POA: Diagnosis present

## 2018-11-21 DIAGNOSIS — E43 Unspecified severe protein-calorie malnutrition: Secondary | ICD-10-CM

## 2018-11-21 LAB — COMPREHENSIVE METABOLIC PANEL
ALT: 19 U/L (ref 0–44)
AST: 19 U/L (ref 15–41)
Albumin: 3.2 g/dL — ABNORMAL LOW (ref 3.5–5.0)
Alkaline Phosphatase: 75 U/L (ref 38–126)
Anion gap: 9 (ref 5–15)
BUN: 5 mg/dL — ABNORMAL LOW (ref 8–23)
CO2: 24 mmol/L (ref 22–32)
Calcium: 9 mg/dL (ref 8.9–10.3)
Chloride: 105 mmol/L (ref 98–111)
Creatinine, Ser: 0.73 mg/dL (ref 0.61–1.24)
GFR calc Af Amer: >60 mL/min (ref >60)
GFR calc non Af Amer: >60 mL/min (ref >60)
Glucose, Bld: 163 mg/dL — ABNORMAL HIGH (ref 70–99)
Potassium: 3.8 mmol/L (ref 3.5–5.1)
Sodium: 138 mmol/L (ref 135–145)
Total Bilirubin: 0.7 mg/dL (ref 0.3–1.2)
Total Protein: 6.6 g/dL (ref 6.5–8.1)

## 2018-11-21 LAB — GLUCOSE, CAPILLARY
Glucose-Capillary: 175 mg/dL — ABNORMAL HIGH (ref 70–99)
Glucose-Capillary: 60 mg/dL — ABNORMAL LOW (ref 70–99)
Glucose-Capillary: 114 mg/dL — ABNORMAL HIGH (ref 70–99)
Glucose-Capillary: 226 mg/dL — ABNORMAL HIGH (ref 70–99)
Glucose-Capillary: 83 mg/dL (ref 70–99)

## 2018-11-21 LAB — HEPARIN LEVEL (UNFRACTIONATED)
Heparin Unfractionated: <0.1 IU/mL — ABNORMAL LOW (ref 0.30–0.70)
Heparin Unfractionated: 0.18 IU/mL — ABNORMAL LOW (ref 0.30–0.70)

## 2018-11-21 LAB — CBC
HCT: 39.3 % (ref 39.0–52.0)
Hemoglobin: 12.8 g/dL — ABNORMAL LOW (ref 13.0–17.0)
MCH: 29.6 pg (ref 26.0–34.0)
MCHC: 32.6 g/dL (ref 30.0–36.0)
MCV: 90.8 fL (ref 80.0–100.0)
Platelets: 277 10*3/uL (ref 150–400)
RBC: 4.33 MIL/uL (ref 4.22–5.81)
RDW: 14.3 % (ref 11.5–15.5)
WBC: 7 10*3/uL (ref 4.0–10.5)
nRBC: 0 % (ref 0.0–0.2)

## 2018-11-21 LAB — APTT: aPTT: 59 seconds — ABNORMAL HIGH (ref 24–36)

## 2018-11-21 NOTE — Progress Notes (Signed)
Pt blood sugar taken before lunch was 60, pt asymptomatic. Provider notified, pt drank two apple juices, and blood sugar was retaken. Blood sugar is now 114 and pt states he "feels fine."

## 2018-11-21 NOTE — Progress Notes (Signed)
Family Medicine Teaching Service Daily Progress Note Intern Pager: [phone]  Patient name: Timothy Martinez Medical record number: [id number] Date of birth: [birth year] Age: 68 y.o. Gender: male  Primary Care Provider: [name], DO Consultants: GI Code Status: Full  Pt Overview and Major Events to Date:  4/26 - admitted.  4/28 - EGD performed with multiple erosions/ulcers concern for intraductal papillary mucinous neoplasm  Assessment and Plan: Timothy Martinez is a 68 y.o. male presenting with 1 week history of nausea and vomiting, found to have an enlargement of known pseudocyst with subsequent duodenal obstruction. PMH is significant for Chronic pancreatitis with pseudocyst, type 2 diabetes, COPD, paroxysmal atrial fibrillation s/p cardioversion, GERD, hyperlipidemia, chronic lower back pain, tobacco use, history of solitary pulmonary nodule, and previous splenectomy.   N/V 2/2 obstruction w/ enlarging known pancreatic pseudocyst:  Acute. Upper endoscopy done 4/28 suspicious for IPMN.  Unable to obtain a biopsy on 4/28 due to to significant food remnants in the GI tract.  Put on a soft diet today we will reattempt EGD with biopsy on 4/30.  During our conversation this morning, he mentioned a history of pancreatic cancer. - GI on board, appreciate recommendations -D5 NS at 125 mL/hour -Dilaudid 1 mg every 4 as needed -PPI 40 mg IV daily -N.p.o. at midnight - Hold heparin at 6 AM 4/30  - Further investigation and previous history of gastrointestinal cancer.  Atypical chest pain, associated with above: Resolved. - Monitor for symptoms  Pancreatitic duct dilation in setting of chronic pancreatitis: Chronic.  Possible slight enlargement of dilatation at 15-17 mm, measured 15 mm on MRCP in 07/2018.  Chronic in nature, not believed to be secondary to stone.  However, has been following with [facility], who was recommending endoscopic ultrasound in the near future to reliably rule out  IPMN. Does not take any pancreatitic supplementation for chronic pancreatitis.  - GI on board, appreciate further recommendations  Type 2 diabetes: Chronic, stable.  A1c 7.2 in 09/2018.   CBG average 100. - SSI if needed - CBG every 4 while n.p.o. - Hold home metformin while admitted  Paroxysmal atrial fib s/p cardioversion: Chronic, stable. Sinus, HR 60. Takes Xarelto and metoprolol at home.   - Heparin on hold due to procedure - Lopressor 10 mg IV every 6, transitioned from home metoprolol 75 twice daily (there is room to increase if needed to true conversion of 15mg ) - Clarify home Xarelto dose (potentially had been taking BID)   COPD: Chronic, stable. No increased WOB and on RA satting appropriately.  - Continue home Dulera -Albuterol PRN - Discussed importance of taking controller medication on a regular basis  HFpEF: Chronic, stable. EF 55-60% with G1 DD on echo in 12/2014.  Euvolemic on exam. Follows with heart care, last seen in 2019 without concern.  - Monitor fluid status, BP  GERD: Chronic, stable. - Protonix 40 mg IV as above  Hyperlipidemia: Chronic, stable. LDL 27, HDL 26. - Holding home atorvastatin while npo, however decrease to 20mg  on d/c  Chronic lower back pain: Chronic, stable. - Monitor for symptoms, on Dilaudid for above  Tobacco use: Chronic. Slowly cutting back, 1 PPD.  Patient declines nicotine patch. - May add on patch as needed  Previous alcohol use: Stable. Reports last drink in 02/2018.    CIWA scores have been 0 during hospitalization so far. -Monitor CIWA scores  FEN/GI: N.p.o. with NG tube, Protonix Prophylaxis: On treatment Heparin w/ h/o afib--on hold for procedure  Disposition:  Pending further GI evaluation  Subjective:  He recalls feeling confused and agitated following his procedure on 4/28.  He reports that this has not happened before.  He does not currently feel confused or agitated and understands that he is  currently a patient at [facility] and understands why he is here.  He has no new complaints this morning.  He specifically denies shortness of breath, chest pain, abdominal pain, nausea/vomiting.  He reported that he has a history of pancreatic cancer.    Objective: Temp:  [97.7 F (36.5 C)-98.6 F (37 C)] 98.2 F (36.8 C) (04/28 2006) Pulse Rate:  [57-94] 57 (04/28 2322) Resp:  [14-28] 16 (04/28 2006) BP: (106-170)/(45-84) 121/56 (04/28 2322) SpO2:  [95 %-100 %] 99 % (04/28 2013)  General: Alert and cooperative and appears to be in no acute distress.  Sitting on the edge of his bed eating his liquid breakfast comfortably. Cardio: Normal S1 and S2, no S3 or S4. Rhythm is regular. No murmurs or rubs.   Pulm: Clear to auscultation bilaterally, no crackles, wheezing, or diminished breath sounds. Normal respiratory effort Abdomen: Bowel sounds normal. Abdomen soft and non-tender.  Extremities: No peripheral edema. Warm/ well perfused.  Strong radial pulse. Neuro: Cranial nerves grossly intact   Laboratory: Recent Labs  Lab 11/19/18 0439 11/20/18 0429 11/21/18 0442  WBC 8.2 9.5 7.0  HGB 12.9* 13.2 12.8*  HCT 40.0 40.5 39.3  PLT 294 289 277   Recent Labs  Lab 11/19/18 0439 11/20/18 0429 11/21/18 0442  NA 140 139 138  K 3.7 4.0 3.8  CL 106 104 105  CO2 25 25 24   BUN 13 <5* 5*  CREATININE 0.85 0.68 0.73  CALCIUM 8.5* 9.0 9.0  PROT 6.5 6.7 6.6  BILITOT 0.6 0.8 0.7  ALKPHOS 75 82 75  ALT 24 22 19   AST 22 21 19   GLUCOSE 100* 95 163*     Imaging/Diagnostic Tests: No results found.  Matilde Haymaker, MD 11/21/2018, 6:29 AM PGY-1, [place] Intern pager: [phone], text pages welcome

## 2018-11-21 NOTE — Progress Notes (Signed)
[place] for Heparin Indication: atrial fibrillation  Allergies  Allergen Reactions  . Prozac [Fluoxetine Hcl] Nausea Only    Made the patient very sick to his stomach  . Morphine And Related Swelling    Pt states he can tolerate oxycodone and hydromorphone  . Penicillins Rash    Has patient had a PCN reaction causing immediate rash, facial/tongue/throat swelling, SOB or lightheadedness with hypotension: Yes Has patient had a PCN reaction causing severe rash involving mucus membranes or skin necrosis: No Has patient had a PCN reaction that required hospitalization: No Has patient had a PCN reaction occurring within the last 10 years: No If all of the above answers are "NO", then may proceed with Cephalosporin use.     Patient Measurements: Height: 6\' 3"  (190.5 cm) Weight: 174 lb (78.9 kg) IBW/kg (Calculated) : 84.5 Heparin Dosing Weight: 78.9 kg  Vital Signs: Temp: 98 F (36.7 C) (04/29 1534) Temp Source: Oral (04/29 1534) BP: 130/67 (04/29 1534) Pulse Rate: 60 (04/29 1534)  Labs: Recent Labs    11/18/18 2304  11/19/18 0439 11/19/18 2050 11/20/18 0429 11/21/18 0442 11/21/18 1701  HGB  --    < > 12.9*  --  13.2 12.8*  --   HCT  --   --  40.0  --  40.5 39.3  --   PLT  --   --  294  --  289 277  --   APTT  --   --   --  53*  --  59*  --   HEPARINUNFRC  --   --   --  0.20*  --  <0.10* 0.18*  CREATININE  --   --  0.85  --  0.68 0.73  --   TROPONINI <0.03  --  <0.03  --   --   --   --    < > = values in this interval not displayed.    Estimated Creatinine Clearance: 100 mL/min (by C-G formula based on SCr of 0.73 mg/dL).  Assessment: 72 yom presenting with worsening abdominal pain and N/V, on Xarelto PTA for hx Afib - last dose on 4/26@0615 . CT abdomen showing probable pseudocyst and chronic pancreatitis  Patient had EGD/EUS 4/28, heparin restart at 2100 per on call MD, Dr. Peyton Bottoms.   Hgb 13.2, plt 289. No s/sx of bleeding  noted and confirmed with nursing.   11/21/2018 6:22 PM update: heparin level is low this evening at 0.18, no issues per RN.   Goal of Therapy:  Heparin level 0.3-0.7 units/mL Monitor platelets by anticoagulation protocol: Yes   Plan:  Increase heparin to 1600 units/hr Re-check heparin level in am Stop time of 0730 noted  Erin Hearing PharmD., BCPS Clinical Pharmacist 11/21/2018 6:22 PM

## 2018-11-21 NOTE — Progress Notes (Signed)
[place] for Heparin Indication: atrial fibrillation  Allergies  Allergen Reactions  . Prozac [Fluoxetine Hcl] Nausea Only    Made the patient very sick to his stomach  . Morphine And Related Swelling    Pt states he can tolerate oxycodone and hydromorphone  . Penicillins Rash    Has patient had a PCN reaction causing immediate rash, facial/tongue/throat swelling, SOB or lightheadedness with hypotension: Yes Has patient had a PCN reaction causing severe rash involving mucus membranes or skin necrosis: No Has patient had a PCN reaction that required hospitalization: No Has patient had a PCN reaction occurring within the last 10 years: No If all of the above answers are "NO", then may proceed with Cephalosporin use.     Patient Measurements: Height: 6\' 3"  (190.5 cm) Weight: 174 lb (78.9 kg) IBW/kg (Calculated) : 84.5 Heparin Dosing Weight: 78.9 kg  Vital Signs: Temp: 97.7 F (36.5 C) (04/29 0630) Temp Source: Oral (04/29 0630) BP: 140/71 (04/29 0630) Pulse Rate: 61 (04/29 0630)  Labs: Recent Labs    11/18/18 1636 11/18/18 2304 11/19/18 0439 11/19/18 2050 11/20/18 0429 11/21/18 0442  HGB  --   --  12.9*  --  13.2 12.8*  HCT  --   --  40.0  --  40.5 39.3  PLT  --   --  294  --  289 277  APTT  --   --   --  53*  --  59*  HEPARINUNFRC  --   --   --  0.20*  --  <0.10*  CREATININE  --   --  0.85  --  0.68 0.73  TROPONINI <0.03 <0.03 <0.03  --   --   --     Estimated Creatinine Clearance: 100 mL/min (by C-G formula based on SCr of 0.73 mg/dL).  Assessment: 84 yom presenting with worsening abdominal pain and N/V, on Xarelto PTA for hx Afib - last dose on 4/26@0615 . CT abdomen showing probable pseudocyst and chronic pancreatitis  Patient had EGD/EUS 4/28, heparin restart at 2100 per on call MD, Dr. Peyton Bottoms.   Hgb 13.2, plt 289. No s/sx of bleeding noted and confirmed with nursing.   11/21/2018 6:49 AM update: heparin level is  undetectable this AM, no issues per RN.   Goal of Therapy:  Heparin level 0.3-0.7 units/mL Monitor platelets by anticoagulation protocol: Yes   Plan:  Inc heparin to 1400 units/hr Re-check heparin level in 8 hours  Narda Bonds, PharmD, [place] Pharmacist Phone: [phone]

## 2018-11-21 NOTE — Progress Notes (Addendum)
Daily Rounding Note  11/21/2018, 9:37 AM  LOS: 3 days   SUBJECTIVE:   Chief complaint: Pancreatic cystic lesion with GOO Patient tells me he was a bit confused yesterday around the time of the procedure but he feels well today after getting a good night sleep, the first good night sleep he has had in a long time.    Pt feels great.  Tolerating clears.  No nausea or vomiting.  Asking if he can advance his diet.  OBJECTIVE:         Vital signs in last 24 hours:    Temp:  [97.7 F (36.5 C)-98.6 F (37 C)] 97.7 F (36.5 C) (04/29 0630) Pulse Rate:  [57-94] 61 (04/29 0630) Resp:  [14-28] 14 (04/29 0630) BP: (106-170)/(45-84) 140/71 (04/29 0630) SpO2:  [95 %-100 %] 98 % (04/29 0858) Last BM Date: 11/18/18 Filed Weights   11/18/18 1106  Weight: 78.9 kg   General: pleasant.  Alert.  Comfortable.     Heart: RRR Chest: clear bil.  No labored breathing.   Abdomen: soft, NT, ND.  Active BS  Extremities: no CCE Neuro/Psych:  Oriented x 3.  No weakness or deficits.    Intake/Output from previous day: 04/28 0701 - 04/29 0700 In: 1665.8 [I.V.:1665.8] Out: 400 [Urine:400]  Intake/Output this shift: No intake/output data recorded.  Lab Results: Recent Labs    11/19/18 0439 11/20/18 0429 11/21/18 0442  WBC 8.2 9.5 7.0  HGB 12.9* 13.2 12.8*  HCT 40.0 40.5 39.3  PLT 294 289 277   BMET Recent Labs    11/19/18 0439 11/20/18 0429 11/21/18 0442  NA 140 139 138  K 3.7 4.0 3.8  CL 106 104 105  CO2 25 25 24   GLUCOSE 100* 95 163*  BUN 13 <5* 5*  CREATININE 0.85 0.68 0.73  CALCIUM 8.5* 9.0 9.0   LFT Recent Labs    11/19/18 0439 11/20/18 0429 11/21/18 0442  PROT 6.5 6.7 6.6  ALBUMIN 3.2* 3.4* 3.2*  AST 22 21 19   ALT 24 22 19   ALKPHOS 75 82 75  BILITOT 0.6 0.8 0.7   PT/INR No results for input(s): LABPROT, INR in the last 72 hours. Hepatitis Panel No results for input(s): HEPBSAG, HCVAB, HEPAIGM, HEPBIGM  in the last 72 hours.  Studies/Results: No results found.   Scheduled Meds: . insulin aspart  0-9 Units Subcutaneous TID WC  . metoprolol tartrate  10 mg Intravenous Q6H  . mometasone-formoterol  2 puff Inhalation BID  . pantoprazole (PROTONIX) IV  40 mg Intravenous Q24H   Continuous Infusions: . dextrose 5 % and 0.9% NaCl 125 mL/hr at 11/20/18 0646  . heparin 1,400 Units/hr (11/21/18 0655)   PRN Meds:.HYDROmorphone (DILAUDID) injection, LORazepam **OR** LORazepam, nitroGLYCERIN, sodium chloride flush   ASSESMENT:   *   Pancreatic cystic lesion with GOO Upper EUS, EGD 4/28:  EGD: esophagitis.  Patient required Jabier Mutton net removal of food residue in the stomach and duodenum.  Moderate esophagitis, erosive gastropathy with clean-based, nonbleeding gastric ulcer.  Deformed and dilated duodenum.  Duodenal stenosis at D1/D2 sweep, but scope able to pass this area.  Congested duodenal mucosa at ampulla, biopsied. EUS: Cystic pancreatic head lesion with associated pancreatic duct nodularity and ? mural nodularity.  Tissue not obtained but ultrasound appearance suspicious for dilated PD versus IPMN-MD.  Intraductal lesions in the pancreatic duct, question mural nodularity. Lesion in the pancreatic neck.  Dilated PD.  Entire pancreas atrophied with hyperechoic foci,  lobularity/honeycombing.  Gallbladder sludge.  Benign appearing peripancreatic lymph nodes. On BID IV Protonix.    PLAN   *    Dr. Rush Landmark plans repeat EUS with attempt to sample the pancreatic duct and neck of pancreas on 4/30 at noon Will need off Heparin 6 hours before repeat EUS     Timothy Martinez  11/21/2018, 9:37 AM Phone [phone]   Attending physician's note   I have taken an interval history, reviewed the chart and examined the patient. I agree with the Advanced Practitioner's note, impression and recommendations.   Plan for EUS tomorrow with sampling of mural nodules of pancreatic duct to exclude IPMN/dysplastic  lesion Tolerating liquids well  K. Denzil Magnuson , MD [phone]

## 2018-11-21 NOTE — Anesthesia Postprocedure Evaluation (Signed)
Anesthesia Post Note  Patient: Timothy Martinez  Procedure(s) Performed: ESOPHAGOGASTRODUODENOSCOPY (EGD) WITH PROPOFOL (N/A ) UPPER ESOPHAGEAL ENDOSCOPIC ULTRASOUND (EUS) (N/A ) FOREIGN BODY REMOVAL BIOPSY     Patient location during evaluation: Endoscopy Anesthesia Type: General Level of consciousness: awake and alert Pain management: pain level controlled Vital Signs Assessment: post-procedure vital signs reviewed and stable Respiratory status: spontaneous breathing, nonlabored ventilation, respiratory function stable and patient connected to nasal cannula oxygen Cardiovascular status: blood pressure returned to baseline and stable Postop Assessment: no apparent nausea or vomiting Anesthetic complications: no    Last Vitals:  Vitals:   11/20/18 2013 11/20/18 2322  BP:  (!) 121/56  Pulse:  (!) 57  Resp:    Temp:    SpO2: 99%     Last Pain:  Vitals:   11/20/18 2013  TempSrc:   PainSc: 0-No pain                 Jaclynne Baldo COKER

## 2018-11-21 NOTE — Plan of Care (Signed)

## 2018-11-22 ENCOUNTER — Encounter (HOSPITAL_COMMUNITY): Payer: Self-pay | Admitting: Certified Registered Nurse Anesthetist

## 2018-11-22 ENCOUNTER — Encounter: Payer: Self-pay | Admitting: Gastroenterology

## 2018-11-22 ENCOUNTER — Encounter (HOSPITAL_COMMUNITY)
Admission: EM | Disposition: A | Discharge status: Home / Self Care | Payer: Self-pay | Source: Home / Self Care | Attending: Family Medicine

## 2018-11-22 DIAGNOSIS — R935 Abnormal findings on diagnostic imaging of other abdominal regions, including retroperitoneum: Secondary | ICD-10-CM

## 2018-11-22 DIAGNOSIS — K859 Acute pancreatitis without necrosis or infection, unspecified: Secondary | ICD-10-CM

## 2018-11-22 LAB — COMPREHENSIVE METABOLIC PANEL
ALT: 16 U/L (ref 0–44)
ALT: 22 U/L (ref 0–44)
AST: 17 U/L (ref 15–41)
AST: 21 U/L (ref 15–41)
Albumin: 2.2 g/dL — ABNORMAL LOW (ref 3.5–5.0)
Albumin: 3.5 g/dL (ref 3.5–5.0)
Alkaline Phosphatase: 48 U/L (ref 38–126)
Alkaline Phosphatase: 74 U/L (ref 38–126)
Anion gap: 5 (ref 5–15)
Anion gap: 7 (ref 5–15)
BUN: <5 mg/dL — ABNORMAL LOW (ref 8–23)
BUN: <5 mg/dL — ABNORMAL LOW (ref 8–23)
CO2: 19 mmol/L — ABNORMAL LOW (ref 22–32)
CO2: 27 mmol/L (ref 22–32)
Calcium: 5.8 mg/dL — CL (ref 8.9–10.3)
Calcium: 9 mg/dL (ref 8.9–10.3)
Chloride: 119 mmol/L — ABNORMAL HIGH (ref 98–111)
Chloride: 105 mmol/L (ref 98–111)
Creatinine, Ser: 0.79 mg/dL (ref 0.61–1.24)
Creatinine, Ser: 0.81 mg/dL (ref 0.61–1.24)
GFR calc Af Amer: >60 mL/min (ref >60)
GFR calc Af Amer: >60 mL/min (ref >60)
GFR calc non Af Amer: >60 mL/min (ref >60)
GFR calc non Af Amer: >60 mL/min (ref >60)
Glucose, Bld: 1349 mg/dL (ref 70–99)
Glucose, Bld: 131 mg/dL — ABNORMAL HIGH (ref 70–99)
Potassium: 2.2 mmol/L — CL (ref 3.5–5.1)
Potassium: 3.2 mmol/L — ABNORMAL LOW (ref 3.5–5.1)
Sodium: 143 mmol/L (ref 135–145)
Sodium: 139 mmol/L (ref 135–145)
Total Bilirubin: 0.3 mg/dL (ref 0.3–1.2)
Total Bilirubin: 0.6 mg/dL (ref 0.3–1.2)
Total Protein: 4.6 g/dL — ABNORMAL LOW (ref 6.5–8.1)
Total Protein: 7 g/dL (ref 6.5–8.1)

## 2018-11-22 LAB — CBC
HCT: 33.4 % — ABNORMAL LOW (ref 39.0–52.0)
Hemoglobin: 9.7 g/dL — ABNORMAL LOW (ref 13.0–17.0)
MCH: 29.6 pg (ref 26.0–34.0)
MCHC: 29 g/dL — ABNORMAL LOW (ref 30.0–36.0)
MCV: 101.8 fL — ABNORMAL HIGH (ref 80.0–100.0)
Platelets: 216 10*3/uL (ref 150–400)
RBC: 3.28 MIL/uL — ABNORMAL LOW (ref 4.22–5.81)
RDW: 15.5 % (ref 11.5–15.5)
WBC: 6.6 10*3/uL (ref 4.0–10.5)
nRBC: 0 % (ref 0.0–0.2)

## 2018-11-22 LAB — GLUCOSE, CAPILLARY
Glucose-Capillary: 125 mg/dL — ABNORMAL HIGH (ref 70–99)
Glucose-Capillary: 128 mg/dL — ABNORMAL HIGH (ref 70–99)
Glucose-Capillary: 290 mg/dL — ABNORMAL HIGH (ref 70–99)
Glucose-Capillary: 150 mg/dL — ABNORMAL HIGH (ref 70–99)

## 2018-11-22 SURGERY — CANCELLED PROCEDURE

## 2018-11-22 MED ORDER — OXYCODONE HCL 5 MG PO TABS
5.0000 mg | ORAL_TABLET | Freq: Four times a day (QID) | ORAL | Status: DC | PRN
  Filled 2018-11-22: qty 1

## 2018-11-22 MED ORDER — SODIUM CHLORIDE 0.9 % IV SOLN: INTRAVENOUS | Status: DC

## 2018-11-22 MED ORDER — SODIUM CHLORIDE 0.9 % IV SOLN
INTRAVENOUS | Status: DC
  Administered 2018-11-22: 19:00:00 via INTRAVENOUS

## 2018-11-22 MED ORDER — LACTATED RINGERS IV SOLN
INTRAVENOUS | Status: DC
  Administered 2018-11-22: 13:00:00 via INTRAVENOUS

## 2018-11-22 MED ORDER — RIVAROXABAN 20 MG PO TABS
20.0000 mg | ORAL_TABLET | Freq: Every day | ORAL | Status: DC
  Administered 2018-11-22: 20 mg via ORAL
  Filled 2018-11-22: qty 1

## 2018-11-22 MED ORDER — BOOST / RESOURCE BREEZE PO LIQD CUSTOM
1.0000 | Freq: Three times a day (TID) | ORAL | Status: DC
  Administered 2018-11-22 (×2): 1 via ORAL

## 2018-11-22 SURGICAL SUPPLY — 15 items

## 2018-11-22 NOTE — Discharge Instructions (Addendum)
Thank you so much for allowing [place] to be a part of your care while at [place] came in with nausea and vomiting, thought to be secondary to your enlarging pancreatic pseudocyst.  GI was able to scope your belly and clear out remaining food.  Fortunately, you now been tolerating a soft diet without any further abdominal pains, vomiting.  Please make sure you follow-up with GI, they are looking forward to performing further procedures in 4-6 weeks.  Additionally, please make sure you follow-up with your primary care provider next week, we have already scheduled an appointment for you.  If you experience recurrent intractable nausea, vomiting, severe abdominal pain, weakness- please seek medical care.   [facility] [place] [facility] Stay Proper nutrition can help your body recover from illness and injury.   Foods and beverages high in protein, vitamins, and minerals help rebuild muscle loss, promote healing, & reduce fall risk.   In addition to eating healthy foods, a nutrition shake is an easy, delicious way to get the nutrition you need during and after your hospital stay  It is recommended that you continue to drink 2 bottles per day of:       Ensure Plus for at least 1 month (30 days) after your hospital stay   Tips for adding a nutrition shake into your routine: As allowed, drink one with vitamins or medications instead of water or juice Enjoy one as a tasty mid-morning or afternoon snack Drink cold or make a milkshake out of it Drink one instead of milk with cereal or snacks Use as a coffee creamer   Available at the following grocery stores and pharmacies:           * [place] [phone]            For COUPONS visit: www.ensure.com/join or http://dawson-may.[email or web address]/   Suggested Substitutions Ensure Plus =  Boost Plus = Carnation Breakfast Essentials = Boost Compact Ensure Active Clear = Boost Breeze Glucerna Shake = Boost Glucose Control = Carnation Breakfast Essentials SUGAR FREE

## 2018-11-22 NOTE — Discharge Summary (Signed)
Timothy Martinez Discharge Summary  Patient name: Timothy Martinez Medical record number: [id number] Date of birth: February 24, 1951 Age: 68 y.o. Gender: male Date of Admission: 11/18/2018  Date of Discharge: 11/23/2018 Admitting Physician: Timothy Ohara McDiarmid, MD  Primary Care Provider: [name], DO Consultants: GI  Indication for Hospitalization: Nausea, vomiting, abdominal pain   Discharge Diagnoses/Problem List:  Pancreatic Pseudocyst Chronic Pancreatitis T2DM Atrial Fibrillation HFpEF COPD GERD HLD  Chronic lumbar pain  Disposition: Home  Discharge Condition: Stable  Discharge Exam:  General: Alert, NAD HEENT: NCAT, MMM, oropharynx nonerythematous  Cardiac: RRR no m/g/r Lungs: Clear bilaterally, no increased WOB  Abdomen: soft, non-tender, non-distended, normoactive BS Msk: Moves all extremities spontaneously  Ext: Warm, dry, 2+ distal pulses, no edema   Brief Martinez Course:  Timothy Martinez is a 68 yo male with a past medical history significant for chronic pancreatitis with pseudocyst and pancreatic duct dilation who presented on 4/26 with abdominal pain, nausea and NBNB vomiting which had started a week prior. On presentation to the ED patient was stable, CT showed distended stomach associated with gastric outlet obstruction around the duodenum in the setting of enlarging pancreatic pseudocyst. CT also showed pancreatic duct dilation concerning for possible malignancy vs sequela of chronic pancreatitis. NG tube was placed for decompression and GI was consulted for possible EUS guided cyst drainage. Patient underwent EGD on 4/28 showed Grade B non bleeding esophagitis and large amount of food residue in the stomach. Patient also had a large ulcer at the pylorus and biopsies were taken in multiple areas of the stomach for histology and H pyloi rule out. Patient also had a severe duodenal deformity in the duodenal bulb with duodenal stenosis. EUS showed  pancreatic cystic lesion with intraductal lesion suspicious IPMN. Patient was supposed to undergo repeat EUS on 4/30 for sampling of pancreatic duct nodularity, however declined and postponed until June. Initial biopsies from the stomach were negative for malignancy and H pylori. At discharge, he was tolerating a soft diet without any further abdominal pain, nausea, or vomiting.   Issues for Follow Up:  1. Ensure he follows up closely with GI and monitor for recurrent abdominal pain/vomiting.  Has follow-up scheduled for 2 weeks and then EUS with biopsy in approximately 6 weeks. 2. LDL 27 on admit.  Decreased atorvastatin 40 to 20 mg on discharge.  Significant Procedures:  EGDx1, EUS >> see results below  Significant Labs and Imaging:  EGD Impression: - LA Grade B esophagitis. - A large amount of food (residue) in the stomach. Removal was successful. - Erosive gastropathy. Non-bleeding gastric ulcer with a clean ulcer base (Forrest Class III). Biopsies were taken with a cold forceps for histology and Helicobacter pylori testing. - Duodenal deformity with dilation. Retained food in the duodenum. Removal was successful. - Acquired duodenal stenosis at D1/D2 sweep. Traveresed with EGD/Duodenoscope. - Congested duodenal mucosa at ampullary region. Biopsied. - No gross lesions in the second portion of the duodenum and in the third portion of the duodenum. EUS Impression: - A cystic lesion was seen in the pancreatic head with associated nodularity within a component of the pancreatic duct - query mural nodularity. Tissue has not been obtained. However, the endosonographic appearance is suspicious for dilated PD vs IPMN-MD. - The pancreatic duct had 2 intraductal lesions that were non-shadowing in the pancreatic head and genu of the pancreas - queyr mural nodularity. - The pancreatic duct had an intraductal lesion that was non-shadowing in the genu of the pancreas -  query mural nodule. - A  [place] identified in the pancreatic neck. - The pancreatic duct had a dilated endosonographic appearance, had intraductal stones, had an irregularly contoured endosonographic appearance and had a prominently branched endosonographic appearance in the pancreatic head, genu of the pancreas, body of the pancreas and tail of the pancreas. - Pancreatic parenchymal abnormalities consisting of atrophy, hyperechoic foci, lobularity with honeycombing and hyperechoic strands were noted in the entire pancreas. - Hyperechoic material consistent with sludge was visualized endosonographically in the gallbladder body. - A few benign lymph nodes were visualized in the peripancreatic region. Tissue has not been obtained. However, the endosonographic appearance is consistent with benign inflammatory changes.     Recent Labs  Lab 11/21/18 0442 11/22/18 0928 11/23/18 0139  WBC 7.0 6.6 8.8  HGB 12.8* 9.7* 12.3*  HCT 39.3 33.4* 37.4*  PLT 277 216 278   Recent Labs  Lab 11/18/18 1636  11/20/18 0429 11/21/18 0442 11/22/18 0928 11/22/18 1317 11/23/18 0139  NA  --    < > 139 138 143 139 139  K  --    < > 4.0 3.8 2.2* 3.2* 3.3*  CL  --    < > 104 105 119* 105 107  CO2  --    < > 25 24 19* 27 23  GLUCOSE  --    < > 95 163* 1,349* 131* 202*  BUN  --    < > <5* 5* <5* <5* 7*  CREATININE  --    < > 0.68 0.73 0.79 0.81 0.86  CALCIUM  --    < > 9.0 9.0 5.8* 9.0 8.6*  MG 1.4*  --  1.7  --   --   --  1.4*  PHOS 3.8  --   --   --   --   --   --   ALKPHOS  --    < > 82 75 48 74 70  AST  --    < > _0 14*  ALT  --    < > _1 ALBUMIN  --    < > 3.4* 3.2* 2.2* 3.5 3.1*   < > = values in this interval not displayed.    Results/Tests Pending at Time of Discharge: None  Discharge Medications:  Allergies as of 11/23/2018      Reactions   Prozac [fluoxetine Hcl] Nausea Only   Made the patient very sick to his stomach   Morphine And Related Swelling   Pt states he can tolerate  oxycodone and hydromorphone   Penicillins Rash   Has patient had a PCN reaction causing immediate rash, facial/tongue/throat swelling, SOB or lightheadedness with hypotension: Yes Has patient had a PCN reaction causing severe rash involving mucus membranes or skin necrosis: No Has patient had a PCN reaction that required hospitalization: No Has patient had a PCN reaction occurring within the last 10 years: No If all of the above answers are "NO", then may proceed with Cephalosporin use.      Medication List    STOP taking these medications   diclofenac sodium 1 % Gel Commonly known as:  VOLTAREN   HYDROcodone-acetaminophen 5-325 MG tablet Commonly known as:  Norco     TAKE these medications   acetaminophen 325 MG tablet Commonly known as:  TYLENOL Take 2 tablets (650 mg total) by mouth every 6 (six) hours.   amitriptyline 10 MG tablet Commonly known as:  ELAVIL TAKE 1 TABLET  BY MOUTH EVERYDAY AT BEDTIME What changed:  See the new instructions.   atorvastatin 40 MG tablet Commonly known as:  LIPITOR Take 0.5 tablets (20 mg total) by mouth daily at 6 PM. What changed:  how much to take   feeding supplement (ENSURE ENLIVE) Liqd Take 237 mLs by mouth 3 (three) times daily between meals.   gabapentin 100 MG capsule Commonly known as:  NEURONTIN TAKE 1 CAPSULE BY MOUTH THREE TIMES A DAY What changed:  See the new instructions.   glucose blood test strip Commonly known as:  ONE TOUCH ULTRA TEST Use check fasting blood glucose qAM. Type 2 Diabetes E11.9   magnesium oxide 400 MG tablet Commonly known as:  MAG-OX TAKE 1 TABLET BY MOUTH EVERY DAY   metFORMIN 1000 MG tablet Commonly known as:  GLUCOPHAGE Take 1 tablet (1,000 mg total) by mouth 2 (two) times daily with a meal.   metoprolol tartrate 50 MG tablet Commonly known as:  LOPRESSOR Take 1.5 tablets (75 mg total) by mouth 2 (two) times daily.   mometasone-formoterol 200-5 MCG/ACT Aero Commonly known as:   DULERA Inhale 2 puffs into the lungs 2 (two) times daily.   multivitamin with minerals Tabs tablet Take 1 tablet by mouth daily. Start taking on:  Nov 24, 2018   nitroGLYCERIN 0.4 MG SL tablet Commonly known as:  Nitrostat Place 1 tablet (0.4 mg total) under the tongue every 5 (five) minutes as needed for chest pain.   ondansetron 4 MG tablet Commonly known as:  Zofran Take 1 tablet (4 mg total) by mouth every 8 (eight) hours as needed for nausea or vomiting.   ONE TOUCH ULTRA 2 w/Device Kit Dispense one glucometer. Diagnosis Type 2 Diabetes. E11.9   onetouch ultrasoft lancets Use as instructed   freestyle lancets Use as instructed   pantoprazole 40 MG tablet Commonly known as:  PROTONIX Take 1 tablet (40 mg total) by mouth daily.   polyethylene glycol 17 g packet Commonly known as:  MIRALAX / GLYCOLAX TAKE 17 G BY MOUTH DAILY.   vitamin B-12 500 MCG tablet Commonly known as:  CYANOCOBALAMIN Take 500 mcg by mouth daily.   Xarelto 20 MG Tabs tablet Generic drug:  rivaroxaban TAKE 1 TABLET BY MOUTH DAILY WITH SUPPER What changed:  See the new instructions.       Discharge Instructions: Please refer to Patient Instructions section of EMR for full details.  Patient was counseled important signs and symptoms that should prompt return to medical care, changes in medications, dietary instructions, activity restrictions, and follow up appointments.   Follow-Up Appointments: Follow-up Information    [facility] Follow up.   Specialty:  Gastroenterology Contact information: [place] [id number] [place]. Go on 11/30/2018.   Specialty:  Family Medicine Why:  At 8:30 AM for your Martinez follow-up with Dr. Andy Gauss.  Contact information: [address] [id number] [place] [address] [address], [place], DO 11/23/2018, 2:36  PM PGY-1, [place]

## 2018-11-22 NOTE — Progress Notes (Signed)
After getting patient ready for procedure the GI doctor(Mansouraty) came to talk with patient and upon discussing the procedure the patient decided he did not want to do the procedure. They also had the patient's daughter on the phone as they were talking. Sending patient back up to the floor.

## 2018-11-22 NOTE — Progress Notes (Signed)
Nutrition Follow-up  DOCUMENTATION CODES:   Severe malnutrition in context of chronic illness  INTERVENTION:   -RD will follow for diet advancement and supplement as appropriate  NUTRITION DIAGNOSIS:   Severe Malnutrition related to chronic illness(COPD, pancreatitis) as evidenced by severe fat depletion, severe muscle depletion.  Ongoing  GOAL:   Patient will meet greater than or equal to 90% of their needs  Unmet  MONITOR:   PO intake, Supplement acceptance, Diet advancement, Labs, Weight trends, Skin, I & O's  REASON FOR ASSESSMENT:   Malnutrition Screening Tool    ASSESSMENT:   Timothy Martinez is a 67 y.o. male presenting with 1 week history of nausea and vomiting, found to have an enlargement of known pseudocyst with subsequent duodenal obstruction. PMH is significant for Chronic pancreatitis with pseudocyst, type 2 diabetes, COPD, paroxysmal atrial fibrillation s/p cardioversion, GERD, hyperlipidemia, chronic lower back pain, tobacco use, history of solitary pulmonary nodule, and previous splenectomy.   4/26- CT of abdomen revealed distended stomach with GOO 2/2 chronic inflammation and mass effect of pseudocyst. NGT placed, connected to low-intermittent suction 4/28- s/p EGD/EUS- revealed multiple erosions/ulcers concern for intraductal papillary mucinous neoplasm; plan for repeat EUS 4/30 in attempt to obtain biopsy 4/30- NGT removed  Reviewed I/O's: -3.8 L x 24 hours and -2.9 L since admission  Spoke with pt, who was in good spirits and sitting in recliner chair at time of visit. He reports feeling great since last EUS ("whatever he did, I feel 100 times better"). PTA he reports inability to keep foods and liquids down for 1 week due to nausea and vomiting with PO intake. Pt shares he has a voracious appetite at baseline and consumes 3 meals per day. He is very eager to try solid foods after procedure; "I need to eat some real food- I'm wasting away".   Pt reports  UBW is around 175#, which he reports he last weighed 2 weeks ago. He is very concerned about his weight loss, muscle loss, and lack of PO intake (he tolerated clear liquids well yesterday per his report). He estimates a 15# wt loss over the past 2 weeks, however, no wt hx to confirm this statement.   Discussed rationale for NPO order. RD also discussed importance of increased protein intake and use of oral nutrition supplements with diet advancement. 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   Labs reviewed: CBGS: 60-266.   NUTRITION - FOCUSED PHYSICAL EXAM:    Most Recent Value  Orbital Region  Severe depletion  Upper Arm Region  Severe depletion  Thoracic and Lumbar Region  Severe depletion  Buccal Region  Severe depletion  Temple Region  Severe depletion  Clavicle Bone Region  Severe depletion  Clavicle and Acromion Bone Region  Severe depletion  Scapular Bone Region  Severe depletion  Dorsal Hand  Severe depletion  Patellar Region  Severe depletion  Anterior Thigh Region  Unable to assess  Posterior Calf Region  Severe depletion  Edema (RD Assessment)  None  Hair  Reviewed  Eyes  Reviewed  Mouth  Reviewed  Skin  Reviewed  Nails  Reviewed       Diet Order:   Diet Order            Diet NPO time specified Except for: Sips with Meds  Diet effective 0500              EDUCATION NEEDS:  Education needs have been addressed  Skin:  Skin Assessment: Reviewed RN Assessment  Last BM:  11/19/18  Height:   Ht Readings from Last 1 Encounters:  11/18/18 6\' 3"  (1.905 m)    Weight:   Wt Readings from Last 1 Encounters:  11/18/18 78.9 kg    Ideal Body Weight:  89.1 kg  BMI:  Body mass index is 21.75 kg/m.  Estimated Nutritional Needs:   Kcal:  6886-4847  Protein:  120-135 grams  Fluid:  > 2.2 L    Aislinn Feliz A. Jimmye Norman, RD, LDN, [place] Registered Dietitian II Certified Diabetes Care and Education  Specialist Pager: [phone] After hours Pager: [phone]

## 2018-11-22 NOTE — Anesthesia Preprocedure Evaluation (Deleted)
Anesthesia Evaluation  Patient identified by MRN, date of birth, ID band Patient awake    Reviewed: Allergy & Precautions, NPO status , Patient's Chart, lab work & pertinent test results  Airway Mallampati: I  TM Distance: >3 FB Neck ROM: Full    Dental no notable dental hx. (+) Edentulous Upper, Edentulous Lower   Pulmonary COPD, Current Smoker,    Pulmonary exam normal breath sounds clear to auscultation       Cardiovascular hypertension, Pt. on medications and Pt. on home beta blockers Normal cardiovascular exam Rhythm:Regular Rate:Normal     Neuro/Psych  Headaches,    GI/Hepatic GERD  ,  Endo/Other  diabetes  Renal/GU      Musculoskeletal   Abdominal   Peds  Hematology negative hematology ROS (+)   Anesthesia Other Findings   Reproductive/Obstetrics                             Anesthesia Physical Anesthesia Plan  ASA: III  Anesthesia Plan: General   Post-op Pain Management:    Induction: Intravenous, Rapid sequence and Cricoid pressure planned  PONV Risk Score and Plan: 0, 2 and 3 and Treatment may vary due to age or medical condition, Dexamethasone, Ondansetron and Propofol infusion  Airway Management Planned: Oral ETT  Additional Equipment:   Intra-op Plan:   Post-operative Plan: Extubation in OR  Informed Consent: I have reviewed the patients History and Physical, chart, labs and discussed the procedure including the risks, benefits and alternatives for the proposed anesthesia with the patient or authorized representative who has indicated his/her understanding and acceptance.     Dental advisory given  Plan Discussed with: CRNA  Anesthesia Plan Comments:         Anesthesia Quick Evaluation

## 2018-11-22 NOTE — Progress Notes (Addendum)
Gastroenterology Inpatient Follow-up Note   PATIENT IDENTIFICATION  Timothy Martinez is a 68 y.o. male with a pmh significant for Chronic Alcoholic Pancreatitis with dilated PD (now off Alcohol), Tobacco Use, pAFib (on anticoagulation),CHF, HTN, HLD, DM. Hospital Day: 5  SUBJECTIVE  Down for EUS today. Feeling better wants to eat more than liquids. No fevers or chills.   OBJECTIVE  Scheduled Inpatient Medications:  . feeding supplement  1 Container Oral TID BM  . insulin aspart  0-9 Units Subcutaneous TID WC  . metoprolol tartrate  10 mg Intravenous Q6H  . mometasone-formoterol  2 puff Inhalation BID  . pantoprazole (PROTONIX) IV  40 mg Intravenous Q24H  . rivaroxaban  20 mg Oral QPC supper   Continuous Inpatient Infusions:  . dextrose 5 % and 0.9% NaCl 125 mL/hr at 11/22/18 1139   PRN Inpatient Medications: HYDROmorphone (DILAUDID) injection, nitroGLYCERIN, sodium chloride flush   Physical Examination  Temp:  [98 F (36.7 C)-98.6 F (37 C)] 98.2 F (36.8 C) (04/30 1423) Pulse Rate:  [56-66] 56 (04/30 1423) Resp:  [17-20] 18 (04/30 1423) BP: (130-163)/(50-84) 148/77 (04/30 1423) SpO2:  [98 %-100 %] 99 % (04/30 1423) Temp (24hrs), Avg:98.3 F (36.8 C), Min:98 F (36.7 C), Max:98.6 F (37 C)  Weight: 78.9 kg GEN: NAD, appears stated age, doesn't appear chronically ill PSYCH: Cooperative, without pressured speech EYE: Conjunctivae pink, sclerae anicteric ENT: MMM CV: RR without R/Gs  RESP: Decreased BS at the bases bilaterally GI: NABS, soft, NT/ND, without rebound or guarding MSK/EXT: No LE edema SKIN: No jaundice NEURO:  Alert & Oriented x 3, no focal deficits   Review of Data   Laboratory Studies   Recent Labs  Lab 11/18/18 1636  11/20/18 0429  11/22/18 1317  NA  --    < > 139   < > 139  K  --    < > 4.0   < > 3.2*  CL  --    < > 104   < > 105  CO2  --    < > 25   < > 27  BUN  --    < > <5*   < > <5*  CREATININE  --    < > 0.68   < > 0.81  GLUCOSE   --    < > 95   < > 131*  CALCIUM  --    < > 9.0   < > 9.0  MG 1.4*  --  1.7  --   --   PHOS 3.8  --   --   --   --    < > = values in this interval not displayed.   Recent Labs  Lab 11/22/18 1317  AST 21  ALT 22  ALKPHOS 74    Recent Labs  Lab 11/20/18 0429 11/21/18 0442 11/22/18 0928  WBC 9.5 7.0 6.6  HGB 13.2 12.8* 9.7*  HCT 40.5 39.3 33.4*  PLT 289 277 216   Recent Labs  Lab 11/21/18 0442  APTT 59*   Imaging Studies  No new relevant studies  GI Procedures and Studies  No new procedures   ASSESSMENT  Timothy Martinez is a 69 y.o. male with a pmh significant for with a pmh significant for Chronic Alcoholic Pancreatitis with dilated PD (now off Alcohol), Tobacco Use, pAFib (on anticoagulation),CHF, HTN, HLD, DM.  I had a frank discussion with the patient and his daughter (over the phone) prior to the initiation of EUS today.  We discussed the overall goal of today's procedure which was to further evaluate the pancreatic ductal region and potentially sample the pancreas duct as well as the lesions within the head and neck of the area of the pancreas duct that suggested potential mural nodularity.  The patient understood the risks of EUS as we had previously discussed this when we had initially had a intention of doing a cyst gastrostomy however the patient is feeling better than when he presented.  He feels however, that he is too weak to have the risk of having pancreatitis occur with the intent of today's procedure.  I discussed with him that I would hope that he be able to continue to improve and be supported and able to tolerate oral diet and if he was able to do that then potentially we could reconsider the role of EUS and ERCP in 4 to 6 weeks.  That would hopefully allow time to heal from the pancreatitis and maybe allow [place] to even consider evaluation of the pancreatic duct itself via ERCP.  He understands that even if we wait he will still have a risk of pancreatitis if we do  sampling via EUS or ERCP.  He also has a 1 to 2% risk of pancreatic duct leak when the pancreas duct is evaluated via EUS FNA. His daughter agrees with this plan since her father does not want to perform procedure now. Plan to postpone procedure. Allow patient to have a soft diet with Protein supplements. If he is able to be discharged, then I will plan to repeat attempt at EUS with possible ERCP in 4-6 weeks. When he is ready to be discharged, the Inpatient GI service will alert me so that I can work on scheduling follow up. I have alerted and spoke with the inpatient medical service.    PLAN/RECOMMENDATIONS  Soft Diet with Protein supplements OK to restart anticoagulation from GI perspective Monitor calorie counts and if tolerating then can be discharged with intention of procedures as outpatient as noted above   Please page/call with questions or concerns.   Justice Britain, MD [facility]oscopy Office # [phone]    LOS: 4 days  Irving Copas  11/22/2018, 3:12 PM

## 2018-11-22 NOTE — Progress Notes (Signed)
Pt returned to room 6N12 from endo. Will continue to monitor.

## 2018-11-22 NOTE — Plan of Care (Signed)
Problem: Education: Goal: Knowledge of General Education information will improve Description Including pain rating scale, medication(s)/side effects and non-pharmacologic comfort measures Outcome: Progressing   Problem: Health Behavior/Discharge Planning: Goal: Ability to manage health-related needs will improve Outcome: Progressing   Problem: Clinical Measurements: Goal: Ability to maintain clinical measurements within normal limits will improve Outcome: Progressing Goal: Respiratory complications will improve Outcome: Progressing Goal: Cardiovascular complication will be avoided Outcome: Progressing   Problem: Activity: Goal: Risk for activity intolerance will decrease Outcome: Progressing   Problem: Nutrition: Goal: Adequate nutrition will be maintained Outcome: Progressing   Problem: Coping: Goal: Level of anxiety will decrease Outcome: Progressing   Problem: Pain Managment: Goal: General experience of comfort will improve Outcome: Progressing   Problem: Safety: Goal: Ability to remain free from injury will improve Outcome: Progressing   Problem: Skin Integrity: Goal: Risk for impaired skin integrity will decrease Outcome: Progressing

## 2018-11-22 NOTE — Progress Notes (Signed)
Family Medicine Teaching Service Daily Progress Note Intern Pager: [phone]  Patient name: Timothy Martinez Medical record number: [id number] Date of birth: 08/26/1950 Age: 68 y.o. Gender: male  Primary Care Provider: [name], DO Consultants: GI Code Status: Full  Pt Overview and Major Events to Date:  4/26 - admitted.  4/28 - EGD performed with multiple erosions/ulcers/nodules within pancreatic duct, which to rule out IPMN   Assessment and Plan: Timothy Martinez is a 68 y.o. male presenting with 1 week history of nausea and vomiting, found to have an enlargement of known pseudocyst with subsequent duodenal obstruction. PMH is significant for Chronic pancreatitis with pseudocyst, type 2 diabetes, COPD, paroxysmal atrial fibrillation s/p cardioversion, GERD, hyperlipidemia, chronic lower back pain, tobacco use, history of solitary pulmonary nodule, and previous splenectomy.   N/V 2/2 obstruction w/ enlarging known pancreatic pseudocyst:  Acute. No further abdominal pain, tolerated soft diet yesterday.  Plan for EUS/EGD to sample pancreatic duct tissue today, 4/30.   - GI on board, appreciate recommendations -N.p.o. for procedure -Transition Dilaudid 1 mg every 4 as needed to oxycodone 5 mg q6 (has not used PRN meds since 4/28)-may increase if needed after procedure -PPI 40 mg IV daily  Atypical chest pain, associated with above: Resolved. - Monitor for symptoms  Pancreatitic duct dilation in setting of chronic pancreatitis: Chronic.  Possible slight enlargement of dilatation at 15-17 mm, measured 15 mm on MRCP in 07/2018.  Chronic in nature, not believed to be secondary to stone.  However, has been following with [facility], who was recommending endoscopic ultrasound in the near future to reliably rule out IPMN. Does not take any pancreatitic supplementation for chronic pancreatitis.  - GI on board, appreciate further recommendations  Type 2 diabetes: Chronic, stable.  A1c 7.2  in 09/2018.   CBG average 100. - SSI if needed - CBG every 4 while n.p.o. - Hold home metformin while admitted  Paroxysmal atrial fib s/p cardioversion: Chronic, stable. Sinus, HR 60. Takes Xarelto and metoprolol at home.   - Heparin on hold due to procedure - Lopressor 10 mg IV every 6, transitioned from home metoprolol 75 twice daily (there is room to increase if needed to true conversion of 15mg ) - Clarify home Xarelto dose (potentially had been taking BID)   COPD: Chronic, stable. No increased WOB and on RA satting appropriately.  - Continue home Dulera -Albuterol PRN - Discussed importance of taking controller medication on a regular basis  HFpEF: Chronic, stable. EF 55-60% with G1 DD on echo in 12/2014.  Euvolemic on exam. Follows with heart care, last seen in 2019 without concern.  - Monitor fluid status, BP  GERD: Chronic, stable. - Protonix 40 mg IV as above  Hyperlipidemia: Chronic, stable. LDL 27, HDL 26. - Holding home atorvastatin while npo, however decrease to 20mg  on d/c  Chronic lower back pain: Chronic, stable. - Monitor for symptoms, on Dilaudid for above  Tobacco use: Chronic. Slowly cutting back, 1 PPD.  Patient declines nicotine patch. - May add on patch as needed  Previous alcohol use: Stable. Reports last drink in 02/2018.    CIWA scores have been 0 during hospitalization so far. -Monitor CIWA scores  FEN/GI: N.p.o. d/t procedure, Protonix Prophylaxis: On treatment Heparin w/ h/o afib--on hold for procedure  Disposition:  Pending further GI evaluation, potential DC this afternoon or tomorrow  Subjective:  Doing well this morning.  States "not sure with the doctor did, but I have not had any belly pains or  problems since the procedure "denies any nausea, vomiting, diarrhea.  No further abdominal pains.  Anxious about having this procedure done today, hopeful to go home afterwards. Objective: Temp:  [97.8 F (36.6 C)-98.6 F (37 C)] 98.6  F (37 C) (04/30 0202) Pulse Rate:  [56-66] 56 (04/30 0202) Resp:  [16-20] 18 (04/30 0202) BP: (130-147)/(50-74) 133/55 (04/30 0202) SpO2:  [98 %-100 %] 100 % (04/30 0202) General: Alert, NAD, standing up walking around his room without concern HEENT: NCAT, MMM, oropharynx nonerythematous  Cardiac: RRR no m/g/r Lungs: Clear bilaterally, no increased WOB  Abdomen: soft, non-tender, non-distended, normoactive BS Msk: Moves all extremities spontaneously  Ext: Warm, dry, 2+ distal pulses, no edema   Laboratory: Recent Labs  Lab 11/19/18 0439 11/20/18 0429 11/21/18 0442  WBC 8.2 9.5 7.0  HGB 12.9* 13.2 12.8*  HCT 40.0 40.5 39.3  PLT 294 289 277   Recent Labs  Lab 11/19/18 0439 11/20/18 0429 11/21/18 0442  NA 140 139 138  K 3.7 4.0 3.8  CL 106 104 105  CO2 25 25 24   BUN 13 <5* 5*  CREATININE 0.85 0.68 0.73  CALCIUM 8.5* 9.0 9.0  PROT 6.5 6.7 6.6  BILITOT 0.6 0.8 0.7  ALKPHOS 75 82 75  ALT 24 22 19   AST 22 21 19   GLUCOSE 100* 95 163*     Imaging/Diagnostic Tests: No results found.  Patriciaann Clan, DO 11/22/2018, 7:41 AM PGY-1, [place] Intern pager: [phone], text pages welcome

## 2018-11-22 NOTE — Plan of Care (Signed)
  Problem: Education: Goal: Knowledge of General Education information will improve Description Including pain rating scale, medication(s)/side effects and non-pharmacologic comfort measures Outcome: Progressing   Problem: Health Behavior/Discharge Planning: Goal: Ability to manage health-related needs will improve Outcome: Progressing   Problem: Clinical Measurements: Goal: Ability to maintain clinical measurements within normal limits will improve Outcome: Progressing Goal: Will remain free from infection Outcome: Progressing Goal: Diagnostic test results will improve Outcome: Progressing   Problem: Activity: Goal: Risk for activity intolerance will decrease Outcome: Progressing   Problem: Nutrition: Goal: Adequate nutrition will be maintained Outcome: Progressing   Problem: Coping: Goal: Level of anxiety will decrease Outcome: Progressing   Problem: Elimination: Goal: Will not experience complications related to urinary retention Outcome: Progressing   Problem: Pain Managment: Goal: General experience of comfort will improve Outcome: Progressing   Problem: Safety: Goal: Ability to remain free from injury will improve Outcome: Progressing   Problem: Skin Integrity: Goal: Risk for impaired skin integrity will decrease Outcome: Progressing

## 2018-11-22 NOTE — Progress Notes (Signed)
CRITICAL VALUE ALERT  Critical Value:  Potassium 2.2; Calcium 5.8; glucose 1349  Date & Time Notied:  11/22/2018 at [place]  Provider Notified: Darrelyn Hillock, DO  Orders Received/Actions taken: repeat BMP

## 2018-11-23 LAB — COMPREHENSIVE METABOLIC PANEL
ALT: 18 U/L (ref 0–44)
AST: 14 U/L — ABNORMAL LOW (ref 15–41)
Albumin: 3.1 g/dL — ABNORMAL LOW (ref 3.5–5.0)
Alkaline Phosphatase: 70 U/L (ref 38–126)
Anion gap: 9 (ref 5–15)
BUN: 7 mg/dL — ABNORMAL LOW (ref 8–23)
CO2: 23 mmol/L (ref 22–32)
Calcium: 8.6 mg/dL — ABNORMAL LOW (ref 8.9–10.3)
Chloride: 107 mmol/L (ref 98–111)
Creatinine, Ser: 0.86 mg/dL (ref 0.61–1.24)
GFR calc Af Amer: >60 mL/min (ref >60)
GFR calc non Af Amer: >60 mL/min (ref >60)
Glucose, Bld: 202 mg/dL — ABNORMAL HIGH (ref 70–99)
Potassium: 3.3 mmol/L — ABNORMAL LOW (ref 3.5–5.1)
Sodium: 139 mmol/L (ref 135–145)
Total Bilirubin: 0.5 mg/dL (ref 0.3–1.2)
Total Protein: 6.4 g/dL — ABNORMAL LOW (ref 6.5–8.1)

## 2018-11-23 LAB — HEPARIN LEVEL (UNFRACTIONATED): Heparin Unfractionated: 2 IU/mL — ABNORMAL HIGH (ref 0.30–0.70)

## 2018-11-23 LAB — MAGNESIUM: Magnesium: 1.4 mg/dL — ABNORMAL LOW (ref 1.7–2.4)

## 2018-11-23 LAB — GLUCOSE, CAPILLARY
Glucose-Capillary: 233 mg/dL — ABNORMAL HIGH (ref 70–99)
Glucose-Capillary: 241 mg/dL — ABNORMAL HIGH (ref 70–99)

## 2018-11-23 MED ORDER — ENSURE ENLIVE PO LIQD: 237.0000 mL | Freq: Three times a day (TID) | ORAL | 12 refills | Status: AC

## 2018-11-23 MED ORDER — ATORVASTATIN CALCIUM 40 MG PO TABS: 20.0000 mg | ORAL_TABLET | Freq: Every day | ORAL | 3 refills | Status: AC

## 2018-11-23 MED ORDER — POTASSIUM CHLORIDE CRYS ER 20 MEQ PO TBCR
40.0000 meq | EXTENDED_RELEASE_TABLET | Freq: Once | ORAL | Status: AC
  Administered 2018-11-23: 08:00:00 40 meq via ORAL
  Filled 2018-11-23: qty 2

## 2018-11-23 MED ORDER — ADULT MULTIVITAMIN W/MINERALS CH: 1.0000 | ORAL_TABLET | Freq: Every day | ORAL | Status: AC

## 2018-11-23 MED ORDER — ENSURE ENLIVE PO LIQD
237.0000 mL | Freq: Three times a day (TID) | ORAL | Status: DC
  Administered 2018-11-23: 10:00:00 237 mL via ORAL

## 2018-11-23 MED ORDER — MAGNESIUM SULFATE 2 GM/50ML IV SOLN
2.0000 g | Freq: Once | INTRAVENOUS | Status: AC
  Administered 2018-11-23: 08:00:00 2 g via INTRAVENOUS
  Filled 2018-11-23: qty 50

## 2018-11-23 MED ORDER — ADULT MULTIVITAMIN W/MINERALS CH
1.0000 | ORAL_TABLET | Freq: Every day | ORAL | Status: DC
  Administered 2018-11-23: 10:00:00 1 via ORAL
  Filled 2018-11-23: qty 1

## 2018-11-23 NOTE — Progress Notes (Signed)
Pt states he already took inhaler this morning, RN in room and aware.

## 2018-11-23 NOTE — Progress Notes (Addendum)
Nutrition Follow-up  RD working remotely.  DOCUMENTATION CODES:   Severe malnutrition in context of chronic illness  INTERVENTION:   -MVI with minerals daily -D/c Boost Breeze po TID, each supplement provides 250 kcal and 9 grams of protein -Ensure Enlive po TID, each supplement provides 350 kcal and 20 grams of protein  NUTRITION DIAGNOSIS:   Severe Malnutrition related to chronic illness(COPD, pancreatitis) as evidenced by severe fat depletion, severe muscle depletion.  Ongoing  GOAL:   Patient will meet greater than or equal to 90% of their needs  Progressing  MONITOR:   PO intake, Supplement acceptance, Diet advancement, Labs, Weight trends, Skin, I & O's  REASON FOR ASSESSMENT:   Malnutrition Screening Tool    ASSESSMENT:   Timothy Martinez is a 68 y.o. male presenting with 1 week history of nausea and vomiting, found to have an enlargement of known pseudocyst with subsequent duodenal obstruction. PMH is significant for Chronic pancreatitis with pseudocyst, type 2 diabetes, COPD, paroxysmal atrial fibrillation s/p cardioversion, GERD, hyperlipidemia, chronic lower back pain, tobacco use, history of solitary pulmonary nodule, and previous splenectomy.   4/26- CT of abdomen revealed distended stomach with GOO 2/2 chronic inflammation and mass effect of pseudocyst. NGT placed, connected to low-intermittent suction 4/28- s/p EGD/EUS- revealed multiple erosions/ulcers concern forintraductal papillary mucinous neoplasm; plan for repeat EUS 4/30 in attempt to obtain biopsy 4/30- NGT removed, refused EGD  Reviewed I/O's: +1.6 L x 24 hours and -1.2 L since admission  UOP: 750 ml x 24 hours  Pt advanced to a soft diet. However, no meal completion data currently recorded. Pt reported hunger during visit yesterday. Due to increased nutrient needs and malnutrition, will add nutrtional supplements now that diet has been advanced.  Pt with poor oral intake and would benefit  from nutrient dense supplement. One Ensure Enlive supplement provides 350 kcals, 20 grams protein, and 44-45 grams of carbohydrate vs one Glucerna shake supplement, which provides 220 kcals, 10 grams of protein, and 26 grams of carbohydrate. Given pt's hx of DM, RD will continue to monitor PO intake, CBGS, and adjust supplement regimen as appropriate.   Labs reviewed: K: 3.3, Mg: 1.4 (on IV supplementation), CBGS: 150-290 (inpatient orders for glycemic control are 0-9 units insulin aspart TID with meals).   Diet Order:   Diet Order            DIET SOFT Room service appropriate? Yes; Fluid consistency: Thin  Diet effective now              EDUCATION NEEDS:   Education needs have been addressed  Skin:  Skin Assessment: Reviewed RN Assessment  Last BM:  11/22/18  Height:   Ht Readings from Last 1 Encounters:  11/18/18 6\' 3"  (1.905 m)    Weight:   Wt Readings from Last 1 Encounters:  11/18/18 78.9 kg    Ideal Body Weight:  89.1 kg  BMI:  Body mass index is 21.75 kg/m.  Estimated Nutritional Needs:   Kcal:  8882-8003  Protein:  120-135 grams  Fluid:  > 2.2 L    Rayquon Uselman A. Jimmye Norman, RD, LDN, [place] Registered Dietitian II Certified Diabetes Care and Education Specialist Pager: [phone] After hours Pager: [phone]

## 2018-11-23 NOTE — Consult Note (Signed)
   [facility] Inpatient Consult   11/23/2018  VIVAN AGOSTINO August 13, 1950 [id number]  Chart reviewed for post hospital follow up needs.  Patient has a history with [facility] out reach for services regarding medications in the past. Will set up for General EMMI follow up. No other needs noted for this admission.  Natividad Brood, RN BSN [facility] Liaison  [phone] business mobile phone Toll free office [phone]  Fax number: [phone] [email or web address]  [email or web address]

## 2018-11-23 NOTE — Progress Notes (Signed)
Timothy Martinez to be D/C'd  per MD order. Discussed with the patient and all questions fully answered.  VSS, Skin clean, dry and intact without evidence of skin break down, no evidence of skin tears noted.  IV catheter discontinued intact. Site without signs and symptoms of complications. Dressing and pressure applied.  An After Visit Summary was printed and given to the patient. Patient received prescription.  D/c education completed with patient/family including follow up instructions, medication list, d/c activities limitations if indicated, with other d/c instructions as indicated by MD - patient able to verbalize understanding, all questions fully answered.   Patient instructed to return to ED, call 911, or call MD for any changes in condition.   Patient to be escorted via [place], and D/C home via private auto.

## 2018-11-24 LAB — CBC
HCT: 37.4 % — ABNORMAL LOW (ref 39.0–52.0)
Hemoglobin: 12.3 g/dL — ABNORMAL LOW (ref 13.0–17.0)
MCH: 29.7 pg (ref 26.0–34.0)
MCHC: 32.9 g/dL (ref 30.0–36.0)
MCV: 90.3 fL (ref 80.0–100.0)
Platelets: 278 10*3/uL (ref 150–400)
RBC: 4.14 MIL/uL — ABNORMAL LOW (ref 4.22–5.81)
RDW: 14.5 % (ref 11.5–15.5)
WBC: 8.8 10*3/uL (ref 4.0–10.5)
nRBC: 0 % (ref 0.0–0.2)

## 2018-11-30 ENCOUNTER — Telehealth: Payer: Self-pay

## 2018-11-30 ENCOUNTER — Other Ambulatory Visit: Payer: Self-pay

## 2018-11-30 ENCOUNTER — Ambulatory Visit (INDEPENDENT_AMBULATORY_CARE_PROVIDER_SITE_OTHER): Payer: Medicare Other | Admitting: Family Medicine

## 2018-11-30 ENCOUNTER — Other Ambulatory Visit: Payer: Self-pay | Admitting: Family Medicine

## 2018-11-30 VITALS — BP 123/58 | HR 80 | Wt 166.0 lb

## 2018-11-30 DIAGNOSIS — Z09 Encounter for follow-up examination after completed treatment for conditions other than malignant neoplasm: Secondary | ICD-10-CM | POA: Insufficient documentation

## 2018-11-30 MED ORDER — AMITRIPTYLINE HCL 10 MG PO TABS: 10.0000 mg | ORAL_TABLET | Freq: Every day | ORAL | 0 refills | Status: DC

## 2018-11-30 NOTE — Patient Instructions (Addendum)
It was great seeing you today! We have addressed the following issues today  1. Make sure you decrease your atorvastatin 20 mg, take half of 40 mg pill.  2. Make sure you follow up for your CT and schedule your procedure with the Gastroenterology. 3. I will send the amitriptyline to your pharmacy.  If we did any lab work today, and the results require attention, either me or my nurse will get in touch with you. If everything is normal, you will get a letter in mail and a message via . If you don't hear from [place] in two weeks, please give [place] a call. Otherwise, we look forward to seeing you again at your next visit. If you have any questions or concerns before then, please call the clinic at [phone].  Please bring all your medications to every doctors visit  Sign up for My Chart to have easy access to your labs results, and communication with your Primary care physician. Please ask Front Desk for some assistance.   Please check-out at the front desk before leaving the clinic.    Take Care,   Dr. Andy Gauss

## 2018-11-30 NOTE — Progress Notes (Signed)
Subjective:    Patient ID: Timothy Martinez, male    DOB: [birth year], 69 y.o.   MRN: [id number]   CC: Hospital follow up for abdominal pain   HPI: Patient is 68 yo male with a past medical history significant for chronic pancreatitis with pseudocyst and pancreatic duct dilation who presented on 4/26 with abdominal pain, nausea and NBNB vomiting and found to have gastric outlet obstruction and esophagitis on EGD perform on 4/30. Patient was negative for H pylori. Patient was scheduled for EUS while hospitlaized after he had findings suspicious for malignancy in his pancreatic duct, however patient decided to postpone procedure. Since discharge, patient reports that he continue to improve, patient has no nausea or vomiting. He is eating a soft diet and tolerating it well. He is moving his bowel every two days and is using Miralax.  He also continue to take his Protonix daily. Patient has an abdominal CT schedule for 5/18 and is schedule to see GI after his imaging is done to schedule EUS. He denies any chest pain or shortness of breath  Smoking status reviewed   ROS: all other systems were reviewed and are negative other than in the HPI   Past Medical History:  Diagnosis Date  . Acute on chronic pancreatitis ([place]) 05/17/2016  . Alcohol abuse   . Alcoholic pancreatitis   . Anginal pain ([place])   . Anxiety and depression 01/20/2015   PHQ 9 = 11 (01/20/15)    . Arthritis    "eat up w/it" (05/17/2016)  . Atrial fibrillation status post cardioversion ([place]) 08/2014  . Atrial flutter, unspecified   . CHF (congestive heart failure) ([place])   . Chronic anticoagulation   . Chronic atrial fibrillation   . Chronic bronchitis ([place])   . Chronic left-sided low back pain with sciatica 12/21/2012  . COLD (chronic obstructive lung disease) ([place])   . Daily headache    "need eye examined" (05/17/2016)  . Gastric outlet obstruction 10/2018  . GERD (gastroesophageal reflux disease)   . Hematuria, microscopic  02/13/2015   Noted on UA - Recheck UA at next visit ~ 1 months   . Hyperlipidemia   . Hypertension   . Insomnia 02/11/2016  . Lateral epicondylitis of right elbow 03/17/2017  . Left foot pain 10/12/2013  . Loss of weight 02/19/2018  . Low back pain 12/21/2012  . Malnutrition of moderate degree 05/18/2016  . Neck pain, bilateral posterior 10/15/2014  . Neuropathy 03/16/2015  . Pain of joint of left ankle and foot 06/13/2018  . Pain of right upper extremity 05/30/2016  . Pancreatic pseudocyst    seen on CT scan 07/2014  . Pancreatitis 08/04/2014  . Permanent atrial fibrillation 05/05/2017  . Pneumonia    "?a few times" (05/17/2016)  . 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 Tobacco abuse 05/30/2011   Heavy smoker up to 3 ppd down to 15 cigs per day in 09/2014   . Type II diabetes mellitus ([place])     Past Surgical History:  Procedure Laterality Date  . BACK SURGERY    . BIOPSY  11/20/2018   Procedure: BIOPSY;  Surgeon: Rush Landmark Telford Nab., MD;  Location: [facility];  Service: Gastroenterology;;  . CARDIOVERSION N/A 09/22/2014   Procedure: CARDIOVERSION;  Surgeon: Thayer Headings, MD;  Location: [facility];  Service: Cardiovascular;  Laterality: N/A;  . COLONOSCOPY W/ POLYPECTOMY  2019  . ESOPHAGOGASTRODUODENOSCOPY (EGD) WITH PROPOFOL N/A 11/20/2018   Procedure: ESOPHAGOGASTRODUODENOSCOPY (EGD) WITH PROPOFOL;  Surgeon: Irving Copas., MD;  [facility];  Service: Gastroenterology;  Laterality: N/A;  . FOREIGN BODY REMOVAL  11/20/2018   Procedure: FOREIGN BODY REMOVAL;  Surgeon: Rush Landmark Telford Nab., MD;  Location: [facility];  Service: Gastroenterology;;  . [place]   not cervical, states had lesion on neck which was removed  . POSTERIOR LAMINECTOMY / DECOMPRESSION LUMBAR SPINE  10/2006   of L4, L5 and S1 with a 5-1 diskectomy, microdissection with the microscope/notes 10/24/2006  . SPLENECTOMY  2003  . UPPER ESOPHAGEAL ENDOSCOPIC ULTRASOUND (EUS) N/A 11/20/2018    Procedure: UPPER ESOPHAGEAL ENDOSCOPIC ULTRASOUND (EUS);  Surgeon: Irving Copas., MD;  Location: [facility];  Service: Gastroenterology;  Laterality: N/A;    Past medical history, surgical, family, and social history reviewed and updated in the EMR as appropriate.  Objective:  BP (!) 123/58   Pulse 80   Wt 166 lb (75.3 kg)   SpO2 97%   BMI 20.75 kg/m   Vitals and nursing note reviewed  General: NAD, pleasant, able to participate in exam Cardiac: RRR, normal heart sounds, no murmurs. 2+ radial and PT pulses bilaterally Respiratory: CTAB, normal effort, No wheezes, rales or rhonchi Abdomen: soft, mildly tender to palpation, nondistended, no hepatic or splenomegaly, +BS Extremities: no edema or cyanosis. WWP. Skin: warm and dry, no rashes noted Neuro: alert and oriented x4, no focal deficits Psych: Normal affect and mood   Assessment & Plan:   Hospital discharge follow-up Patient has been doing well since discharge from hospital. He continue to be on protonix daily. Denies any abdominal pain, n/v. He has BM every two day with miralax. He has tolerate soft diet well and is advancing as tolerated. Discuss cutting his atorvastatin to 20 mg from 40 mg. He will followed with GI in two weeks after schedule CT abdomen to schedule EUS. Amitripyline was refilled. Patient mentioned waking up at night to urinate frequently, suspect BPH. Please follow up with PCP at next office visit.   Marjie Skiff, MD [place] PGY-3

## 2018-11-30 NOTE — Assessment & Plan Note (Signed)
Patient has been doing well since discharge from hospital. He continue to be on protonix daily. Denies any abdominal pain, n/v. He has BM every two day with miralax. He has tolerate soft diet well and is advancing as tolerated. Discuss cutting his atorvastatin to 20 mg from 40 mg. He will followed with GI in two weeks after schedule CT abdomen to schedule EUS. Amitripyline was refilled. Patient mentioned waking up at night to urinate frequently, suspect BPH. Please follow up with PCP at next office visit.

## 2018-12-07 ENCOUNTER — Telehealth: Payer: Self-pay | Admitting: *Deleted

## 2018-12-07 NOTE — Telephone Encounter (Signed)

## 2018-12-10 ENCOUNTER — Other Ambulatory Visit: Payer: Self-pay

## 2018-12-10 ENCOUNTER — Ambulatory Visit (INDEPENDENT_AMBULATORY_CARE_PROVIDER_SITE_OTHER)
Admission: RE | Admit: 2018-12-10 | Discharge: 2018-12-10 | Disposition: A | Discharge status: Home / Self Care | Payer: Medicare Other | Source: Ambulatory Visit | Attending: Gastroenterology | Admitting: Gastroenterology

## 2018-12-10 ENCOUNTER — Telehealth: Payer: Self-pay | Admitting: Nurse Practitioner

## 2018-12-10 ENCOUNTER — Telehealth: Payer: Self-pay | Admitting: Gastroenterology

## 2018-12-10 DIAGNOSIS — K862 Cyst of pancreas: Secondary | ICD-10-CM | POA: Diagnosis not present

## 2018-12-10 DIAGNOSIS — K863 Pseudocyst of pancreas: Secondary | ICD-10-CM | POA: Diagnosis not present

## 2018-12-10 DIAGNOSIS — K8689 Other specified diseases of pancreas: Secondary | ICD-10-CM

## 2018-12-10 DIAGNOSIS — K861 Other chronic pancreatitis: Secondary | ICD-10-CM | POA: Diagnosis not present

## 2018-12-10 MED ORDER — IOHEXOL 300 MG/ML  SOLN
80.0000 mL | Freq: Once | INTRAMUSCULAR | Status: AC | PRN
  Administered 2018-12-10: 80 mL via INTRAVENOUS

## 2018-12-10 NOTE — Telephone Encounter (Signed)
-----   Message from Timothy Martinez sent at 12/10/2018 10:02 AM EDT ----- GM,  Timothy Martinez WOULD LIKE TO SPEAK WITH SOMEONE ABOUT HIS BLOOD THINNER. HE IS DUE TO GO BACK FOR ANOTHER PROCEDURE SOON AND HE HAS SOME QUESTION. PLEASE CALL AT [phone]  THANK YOU.

## 2018-12-10 NOTE — Telephone Encounter (Signed)
The pt has been advised that Dr Rush Landmark will review his CT scan and make recommendations on timing of EUS. The pt has been advised of the information and verbalized understanding.

## 2018-12-10 NOTE — Telephone Encounter (Signed)
Pt stated that he had his CT this morning and is ready to schedule EUS.

## 2018-12-10 NOTE — Telephone Encounter (Signed)
Spoke with patient who states he had a CT today and will be scheduled for a procedure to remove something from his liver. He asks if he is supposed to stop his blood thinner. I advised patient that the MD performing the procedure should send a request to our office based on the type of procedure and the length of time he would like the patient to remain off Xarelto. Patient states he will ask about getting clearance when they call him to schedule the procedure. He thanked me for my help.

## 2018-12-22 ENCOUNTER — Encounter (HOSPITAL_COMMUNITY): Payer: Self-pay | Admitting: Emergency Medicine

## 2018-12-22 ENCOUNTER — Other Ambulatory Visit: Payer: Self-pay

## 2018-12-22 ENCOUNTER — Emergency Department (HOSPITAL_COMMUNITY)
Admission: EM | Admit: 2018-12-22 | Discharge: 2018-12-22 | Disposition: A | Discharge status: Home / Self Care | Payer: Medicare Other | Attending: Emergency Medicine | Admitting: Emergency Medicine

## 2018-12-22 DIAGNOSIS — F17210 Nicotine dependence, cigarettes, uncomplicated: Secondary | ICD-10-CM | POA: Diagnosis not present

## 2018-12-22 DIAGNOSIS — Z79899 Other long term (current) drug therapy: Secondary | ICD-10-CM | POA: Diagnosis not present

## 2018-12-22 DIAGNOSIS — I110 Hypertensive heart disease with heart failure: Secondary | ICD-10-CM | POA: Diagnosis not present

## 2018-12-22 DIAGNOSIS — R1013 Epigastric pain: Secondary | ICD-10-CM | POA: Insufficient documentation

## 2018-12-22 DIAGNOSIS — R112 Nausea with vomiting, unspecified: Secondary | ICD-10-CM | POA: Diagnosis not present

## 2018-12-22 DIAGNOSIS — R1011 Right upper quadrant pain: Secondary | ICD-10-CM | POA: Diagnosis not present

## 2018-12-22 DIAGNOSIS — Z7901 Long term (current) use of anticoagulants: Secondary | ICD-10-CM | POA: Diagnosis not present

## 2018-12-22 DIAGNOSIS — E119 Type 2 diabetes mellitus without complications: Secondary | ICD-10-CM | POA: Diagnosis not present

## 2018-12-22 DIAGNOSIS — Z7984 Long term (current) use of oral hypoglycemic drugs: Secondary | ICD-10-CM | POA: Diagnosis not present

## 2018-12-22 DIAGNOSIS — R1032 Left lower quadrant pain: Secondary | ICD-10-CM | POA: Diagnosis not present

## 2018-12-22 DIAGNOSIS — I509 Heart failure, unspecified: Secondary | ICD-10-CM | POA: Diagnosis not present

## 2018-12-22 DIAGNOSIS — I10 Essential (primary) hypertension: Secondary | ICD-10-CM | POA: Diagnosis not present

## 2018-12-22 DIAGNOSIS — R109 Unspecified abdominal pain: Secondary | ICD-10-CM

## 2018-12-22 LAB — CBC WITH DIFFERENTIAL/PLATELET
Abs Immature Granulocytes: 0.04 10*3/uL (ref 0.00–0.07)
Basophils Absolute: 0.1 10*3/uL (ref 0.0–0.1)
Basophils Relative: 1 %
Eosinophils Absolute: 0.2 10*3/uL (ref 0.0–0.5)
Eosinophils Relative: 2 %
HCT: 42.2 % (ref 39.0–52.0)
Hemoglobin: 13.7 g/dL (ref 13.0–17.0)
Immature Granulocytes: 0 %
Lymphocytes Relative: 28 %
Lymphs Abs: 2.8 10*3/uL (ref 0.7–4.0)
MCH: 29.5 pg (ref 26.0–34.0)
MCHC: 32.5 g/dL (ref 30.0–36.0)
MCV: 90.9 fL (ref 80.0–100.0)
Monocytes Absolute: 0.8 10*3/uL (ref 0.1–1.0)
Monocytes Relative: 8 %
Neutro Abs: 6.1 10*3/uL (ref 1.7–7.7)
Neutrophils Relative %: 61 %
Platelets: 319 10*3/uL (ref 150–400)
RBC: 4.64 MIL/uL (ref 4.22–5.81)
RDW: 14.5 % (ref 11.5–15.5)
WBC: 10 10*3/uL (ref 4.0–10.5)
nRBC: 0 % (ref 0.0–0.2)

## 2018-12-22 LAB — COMPREHENSIVE METABOLIC PANEL
ALT: 16 U/L (ref 0–44)
AST: 21 U/L (ref 15–41)
Albumin: 3.6 g/dL (ref 3.5–5.0)
Alkaline Phosphatase: 77 U/L (ref 38–126)
Anion gap: 10 (ref 5–15)
BUN: 13 mg/dL (ref 8–23)
CO2: 26 mmol/L (ref 22–32)
Calcium: 9.5 mg/dL (ref 8.9–10.3)
Chloride: 103 mmol/L (ref 98–111)
Creatinine, Ser: 0.8 mg/dL (ref 0.61–1.24)
GFR calc Af Amer: >60 mL/min (ref >60)
GFR calc non Af Amer: >60 mL/min (ref >60)
Glucose, Bld: 139 mg/dL — ABNORMAL HIGH (ref 70–99)
Potassium: 4.1 mmol/L (ref 3.5–5.1)
Sodium: 139 mmol/L (ref 135–145)
Total Bilirubin: 0.7 mg/dL (ref 0.3–1.2)
Total Protein: 7.4 g/dL (ref 6.5–8.1)

## 2018-12-22 LAB — LIPASE, BLOOD: Lipase: 56 U/L — ABNORMAL HIGH (ref 11–51)

## 2018-12-22 LAB — TROPONIN I: Troponin I: <0.03 ng/mL (ref <0.03)

## 2018-12-22 MED ORDER — ONDANSETRON HCL 4 MG/2ML IJ SOLN
4.0000 mg | Freq: Once | INTRAMUSCULAR | Status: AC
  Administered 2018-12-22: 4 mg via INTRAVENOUS
  Filled 2018-12-22: qty 2

## 2018-12-22 MED ORDER — SODIUM CHLORIDE 0.9 % IV BOLUS
1000.0000 mL | Freq: Once | INTRAVENOUS | Status: AC
  Administered 2018-12-22: 1000 mL via INTRAVENOUS

## 2018-12-22 MED ORDER — HYDROMORPHONE HCL 1 MG/ML IJ SOLN
1.0000 mg | Freq: Once | INTRAMUSCULAR | Status: AC
  Administered 2018-12-22: 1 mg via INTRAVENOUS
  Filled 2018-12-22: qty 1

## 2018-12-22 MED ORDER — ONDANSETRON 4 MG PO TBDP: 4.0000 mg | ORAL_TABLET | Freq: Three times a day (TID) | ORAL | 0 refills | Status: AC | PRN

## 2018-12-22 NOTE — Discharge Instructions (Signed)
Please return to the emergency department or see your doctor right away if you (or your family member) experience any of the following:   Pain that gets worse or moves to just one spot.  Pain that gets worse if you cough or sneeze.  Pain with going over a bump in the road.  Pain that does not get better in 24 hours.  Inability to keep down liquids (vomiting)--especially if you are making less urine.  Fainting.  Blood in the vomit or stool.  High fever or shaking chills.  Swelling of the abdomen.  Any new or worsening problem.   Follow-up Instructions  Important that you follow-up with GI.  I recommend you call first thing Monday morning to schedule an appointment to hopefully be seen within the week. Medications  Take the following medications: Continue home medications.  I have sent a prescription to your pharmacy for Zofran.  This is for nausea, take as prescribed.   Additional Instructions  No alcohol.  No caffeine, aspirin, or cigarettes.

## 2018-12-22 NOTE — ED Triage Notes (Signed)
Pt has a diagnosis of pancreatitis. PT states he was here a week or two ago and got diagnosed. Felt better but 2 hours ago started throwing up again with same abdominal pain.

## 2018-12-22 NOTE — ED Notes (Signed)
Patient tolerated crackers and water.

## 2018-12-22 NOTE — ED Provider Notes (Signed)
[facility] EMERGENCY DEPARTMENT Provider Note   CSN: [id number] Arrival date & time: 12/22/18  1703    History   Chief Complaint Chief Complaint  Patient presents with  . Abdominal Pain    HPI Timothy Martinez is a 68 y.o. male history of acute on chronic pancreatitis, A. fib on Xarelto, type 2 diabetes, gastric outlet obstruction, hypertension presented emergency department today with chief complaint of abdominal pain.  Onset was acute happening just prior to arrival.  Patient describes the pain is located in his epigastric region and radiates to his chest.  He describes the pain as sharp, he states the pain has been constant since onset. He rates pain 8/10 in severity. He admits to 5 episodes of associated emesis, nonbloody nonbilious.  He denies any fever, chills, urinary symptoms, diarrhea.  Chart review shows patient had recent admission for similar complaint, and CT scan showing gastric outlet obstruction from increasing pseudocyst.  During hospital stay underwent EGD and had biopsies of the stomach.  H. pylori is negative. patient was supposed to have a follow-up endoscopic ultrasound to sample pancreatic duct nodularity however he declined and is scheduled to follow-up with [facility] Dr. Rush Landmark on 6/17.   Past Medical History:  Diagnosis Date  . Acute on chronic pancreatitis ([place]) 05/17/2016  . Alcohol abuse   . Alcoholic pancreatitis   . Anginal pain ([place])   . Anxiety and depression 01/20/2015   PHQ 9 = 11 (01/20/15)    . Arthritis    "eat up w/it" (05/17/2016)  . Atrial fibrillation status post cardioversion ([place]) 08/2014  . Atrial flutter, unspecified   . CHF (congestive heart failure) ([place])   . Chronic anticoagulation   . Chronic atrial fibrillation   . Chronic bronchitis ([place])   . Chronic left-sided low back pain with sciatica 12/21/2012  . COLD (chronic obstructive lung disease) ([place])   . Daily headache    "need eye examined" (05/17/2016)  .  Gastric outlet obstruction 10/2018  . GERD (gastroesophageal reflux disease)   . Hematuria, microscopic 02/13/2015   Noted on UA - Recheck UA at next visit ~ 1 months   . Hyperlipidemia   . Hypertension   . Insomnia 02/11/2016  . Lateral epicondylitis of right elbow 03/17/2017  . Left foot pain 10/12/2013  . Loss of weight 02/19/2018  . Low back pain 12/21/2012  . Malnutrition of moderate degree 05/18/2016  . Neck pain, bilateral posterior 10/15/2014  . Neuropathy 03/16/2015  . Pain of joint of left ankle and foot 06/13/2018  . Pain of right upper extremity 05/30/2016  . Pancreatic pseudocyst    seen on CT scan 07/2014  . Pancreatitis 08/04/2014  . Permanent atrial fibrillation 05/05/2017  . Pneumonia    "?a few times" (05/17/2016)  . 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 Tobacco abuse 05/30/2011   Heavy smoker up to 3 ppd down to 15 cigs per day in 09/2014   . Type II diabetes mellitus [facility])     Patient Active Problem List   Diagnosis Date Noted  . Hospital discharge follow-up 11/30/2018  . Hypoglycemia associated with diabetes ([place]) 11/21/2018  . Esophagitis determined by endoscopy 11/21/2018  . Gastric ulcer 11/21/2018  . Gastric outlet obstruction 11/19/2018  . Hypovolemia dehydration 11/19/2018  . History of colonic polyps 05/25/2018  . Dilation of pancreatic duct 04/16/2018  . 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 Protein-calorie malnutrition, severe 03/22/2018  . Other partial intestinal obstruction ([place])   . Atrial fibrillation with RVR (  [place]) 03/21/2018  . Pseudocyst of pancreas 03/11/2018  . Solitary pulmonary nodule 05/12/2017  . On continuous oral anticoagulation 06/19/2015  . Type 2 diabetes mellitus without complication ([place])   . Emphysema lung ([place]) 04/27/2012  . GERD (gastroesophageal reflux disease) 03/05/2012  . Alcohol use disorder, mild, in early remission, abuse 06/09/2011  . Chronic alcoholic pancreatitis ([place]) 05/30/2011  . Tobacco abuse 05/30/2011    Past Surgical History:   Procedure Laterality Date  . BACK SURGERY    . BIOPSY  11/20/2018   Procedure: BIOPSY;  Surgeon: Rush Landmark Telford Nab., MD;  Location: [facility];  Service: Gastroenterology;;  . CARDIOVERSION N/A 09/22/2014   Procedure: CARDIOVERSION;  Surgeon: Thayer Headings, MD;  Location: [facility];  Service: Cardiovascular;  Laterality: N/A;  . COLONOSCOPY W/ POLYPECTOMY  2019  . ESOPHAGOGASTRODUODENOSCOPY (EGD) WITH PROPOFOL N/A 11/20/2018   Procedure: ESOPHAGOGASTRODUODENOSCOPY (EGD) WITH PROPOFOL;  Surgeon: Rush Landmark Telford Nab., MD;  Location: [facility];  Service: Gastroenterology;  Laterality: N/A;  . FOREIGN BODY REMOVAL  11/20/2018   Procedure: FOREIGN BODY REMOVAL;  Surgeon: Rush Landmark Telford Nab., MD;  Location: [facility];  Service: Gastroenterology;;  . [place]   not cervical, states had lesion on neck which was removed  . POSTERIOR LAMINECTOMY / DECOMPRESSION LUMBAR SPINE  10/2006   of L4, L5 and S1 with a 5-1 diskectomy, microdissection with the microscope/notes 10/24/2006  . SPLENECTOMY  2003  . UPPER ESOPHAGEAL ENDOSCOPIC ULTRASOUND (EUS) N/A 11/20/2018   Procedure: UPPER ESOPHAGEAL ENDOSCOPIC ULTRASOUND (EUS);  Surgeon: Irving Copas., MD;  Location: [facility];  Service: Gastroenterology;  Laterality: N/A;        Home Medications    Prior to Admission medications   Medication Sig Start Date End Date Taking? Authorizing Provider  acetaminophen (TYLENOL) 325 MG tablet Take 2 tablets (650 mg total) by mouth every 6 (six) hours. 03/23/18   Meccariello, Bernita Raisin, DO  amitriptyline (ELAVIL) 10 MG tablet TAKE 1 TABLET BY MOUTH EVERYDAY AT BEDTIME 12/02/18   Lockamy, Timothy, DO  atorvastatin (LIPITOR) 40 MG tablet Take 0.5 tablets (20 mg total) by mouth daily at 6 PM. 11/23/18   Patriciaann Clan, DO  Blood Glucose Monitoring Suppl (ONE TOUCH ULTRA 2) w/Device KIT Dispense one glucometer. Diagnosis Type 2 Diabetes. E11.9 06/13/18   Rory Percy, DO   cyanocobalamin 500 MCG tablet Take 500 mcg by mouth daily.    [provider]  feeding supplement, ENSURE ENLIVE, (ENSURE ENLIVE) LIQD Take 237 mLs by mouth 3 (three) times daily between meals. 11/23/18   Patriciaann Clan, DO  gabapentin (NEURONTIN) 100 MG capsule TAKE 1 CAPSULE BY MOUTH THREE TIMES A DAY Patient taking differently: Take 100 mg by mouth 3 (three) times daily.  03/14/18   [name], DO  glucose blood (ONE TOUCH ULTRA TEST) test strip Use check fasting blood glucose qAM. Type 2 Diabetes E11.9 10/01/18   [name], DO  Lancets (FREESTYLE) lancets Use as instructed 10/02/18   [name], DO  Lancets (ONETOUCH ULTRASOFT) lancets Use as instructed 10/02/18   [name], DO  magnesium oxide (MAG-OX) 400 MG tablet TAKE 1 TABLET BY MOUTH EVERY DAY 11/20/18   [name], DO  metFORMIN (GLUCOPHAGE) 1000 MG tablet Take 1 tablet (1,000 mg total) by mouth 2 (two) times daily with a meal. 02/19/18   Lockamy, Timothy, DO  metoprolol tartrate (LOPRESSOR) 50 MG tablet Take 1.5 tablets (75 mg total) by mouth 2 (two) times daily. 02/19/18   [name],  DO  mometasone-formoterol (DULERA) 200-5 MCG/ACT AERO Inhale 2 puffs into the lungs 2 (two) times daily. 09/27/18 12/26/18  [name], DO  Multiple Vitamin (MULTIVITAMIN WITH MINERALS) TABS tablet Take 1 tablet by mouth daily. 11/24/18   Patriciaann Clan, DO  nitroGLYCERIN (NITROSTAT) 0.4 MG SL tablet Place 1 tablet (0.4 mg total) under the tongue every 5 (five) minutes as needed for chest pain. 03/23/18 03/23/19  Meccariello, Bernita Raisin, DO  ondansetron (ZOFRAN) 4 MG tablet Take 1 tablet (4 mg total) by mouth every 8 (eight) hours as needed for nausea or vomiting. Patient not taking: Reported on 11/18/2018 03/20/18   [name], DO  pantoprazole (PROTONIX) 40 MG tablet Take 1 tablet (40 mg total) by mouth daily. 10/01/18   Lockamy, Christia [place], DO  polyethylene glycol (MIRALAX / GLYCOLAX) 17 g packet TAKE 17 G BY MOUTH  DAILY. 11/14/18   Lockamy, Timothy, DO  XARELTO 20 MG TABS tablet TAKE 1 TABLET BY MOUTH DAILY WITH SUPPER Patient taking differently: Take 20 mg by mouth 2 (two) times a day.  09/25/18   [name], DO    Family History Family History  Problem Relation Age of Onset  . Diabetes Mother   . Aneurysm Mother   . Stroke Mother   . Hypertension Mother   . Alcohol abuse Father   . Cancer Father        Lung  . Cancer Sister        Lung Cancer  . Post-traumatic stress disorder Brother   . Heart attack Neg Hx   . Colon cancer Neg Hx   . Liver cancer Neg Hx   . Rectal cancer Neg Hx   . Esophageal cancer Neg Hx   . Stomach cancer Neg Hx   . Inflammatory bowel disease Neg Hx     Social History Social History   Tobacco Use  . Smoking status: Current Every Day Smoker    Packs/day: 1.00    Years: 52.00    Pack years: 52.00    Types: Cigarettes    Start date: 04/09/1964  . Smokeless tobacco: Never Used  . Tobacco comment: down to one pack daily  Substance Use Topics  . Alcohol use: Not Currently    Comment: QUIT IN 02/2018  . Drug use: No     Allergies   Prozac [fluoxetine hcl]; Morphine and related; and Penicillins   Review of Systems Review of Systems  Constitutional: Negative for chills and fever.  HENT: Negative for congestion, rhinorrhea, sinus pressure and sore throat.   Eyes: Negative for pain and redness.  Respiratory: Negative for cough, shortness of breath and wheezing.   Cardiovascular: Negative for chest pain and palpitations.  Gastrointestinal: Positive for abdominal pain, nausea and vomiting. Negative for constipation and diarrhea.  Genitourinary: Negative for dysuria.  Musculoskeletal: Negative for arthralgias, back pain, myalgias and neck pain.  Skin: Negative for rash and wound.  Neurological: Negative for dizziness, syncope, weakness, numbness and headaches.  Psychiatric/Behavioral: Negative for confusion.     Physical Exam Updated Vital Signs BP  127/70   Pulse 71   Temp 98.2 F (36.8 C) (Oral)   Resp 20   SpO2 95%   Physical Exam Vitals signs and nursing note reviewed.  Constitutional:      General: He is not in acute distress.    Appearance: He is not ill-appearing.  HENT:     Head: Normocephalic and atraumatic.     Right Ear: Tympanic membrane and external ear normal.  Left Ear: Tympanic membrane and external ear normal.     Nose: Nose normal.     Mouth/Throat:     Mouth: Mucous membranes are moist.     Pharynx: Oropharynx is clear.  Eyes:     General: No scleral icterus.       Right eye: No discharge.        Left eye: No discharge.     Extraocular Movements: Extraocular movements intact.     Conjunctiva/sclera: Conjunctivae normal.     Pupils: Pupils are equal, round, and reactive to light.  Neck:     Musculoskeletal: Normal range of motion.     Vascular: No JVD.  Cardiovascular:     Rate and Rhythm: Normal rate and regular rhythm.     Pulses: Normal pulses.          Radial pulses are 2+ on the right side and 2+ on the left side.     Heart sounds: Normal heart sounds.  Pulmonary:     Comments: Lungs clear to auscultation in all fields. Symmetric chest rise. No wheezing, rales, or rhonchi. Abdominal:     Tenderness: There is abdominal tenderness in the right upper quadrant, epigastric area and left upper quadrant. There is no right CVA tenderness, left CVA tenderness, guarding or rebound.     Comments: Abdomen is soft, non-distended. No peritoneal signs.  Musculoskeletal: Normal range of motion.  Skin:    General: Skin is warm and dry.     Capillary Refill: Capillary refill takes less than 2 seconds.  Neurological:     Mental Status: He is oriented to person, place, and time.     GCS: GCS eye subscore is 4. GCS verbal subscore is 5. GCS motor subscore is 6.     Comments: Fluent speech, no facial droop.  Psychiatric:        Behavior: Behavior normal.      ED Treatments / Results  Labs (all labs  ordered are listed, but only abnormal results are displayed) Labs Reviewed  COMPREHENSIVE METABOLIC PANEL - Abnormal; Notable for the following components:      Result Value   Glucose, Bld 139 (*)    All other components within normal limits  LIPASE, BLOOD - Abnormal; Notable for the following components:   Lipase 56 (*)    All other components within normal limits  CBC WITH DIFFERENTIAL/PLATELET  TROPONIN I    EKG EKG Interpretation  Date/Time:  Saturday Dec 22 2018 19:03:33 EDT Ventricular Rate:  72 PR Interval:    QRS Duration: 90 QT Interval:  415 QTC Calculation: 455 R Axis:   78 Text Interpretation:  Sinus rhythm Confirmed by Davonna Belling [id number]) on 12/22/2018 7:05:29 PM   Radiology No results found.  Procedures Procedures (including critical care time)  Medications Ordered in ED Medications  ondansetron (ZOFRAN) injection 4 mg (4 mg Intravenous Given 12/22/18 1941)  HYDROmorphone (DILAUDID) injection 1 mg (1 mg Intravenous Given 12/22/18 1946)  sodium chloride 0.9 % bolus 1,000 mL (1,000 mLs Intravenous New Bag/Given 12/22/18 1941)     Initial Impression / Assessment and Plan / ED Course  I have reviewed the triage vital signs and the nursing notes.  Pertinent labs & imaging results that were available during my care of the patient were reviewed by me and considered in my medical decision making (see chart for details).   Patient is afebrile, nontoxic, nonseptic appearing, in no apparent distress.  Patient's epigastric pain radiates to his chest.  Patient's  pain and other symptoms adequately managed in emergency department.  Fluid bolus given.  Labs, imaging and vitals reviewed.  Labs today show a lipase of 56, this is an improvement compared to 1 month ago when it was 129.  CMP and CBC unremarkable.  Troponin negative and EKG without ischemic changes, ACS unlikely. Patient given IV Dilaudid and Zofran. On repeat exam patient is pain-free. He does not have a  surgical abdomen and there are no peritoneal signs. Patient is tolerating fluids and saltine crackers in ER without any vomiting throughout ER stay. Patient discharged home with symptomatic treatment and given strict instructions for follow-up with GI.  I have also discussed reasons to return immediately to the ER.  Patient expresses understanding and agrees with plan. Findings and plan of care discussed with supervising physician Dr. Alvino Chapel.  This note was prepared using Dragon voice recognition software and may include unintentional dictation errors due to the inherent limitations of voice recognition software.     Final Clinical Impressions(s) / ED Diagnoses   Final diagnoses:  Abdominal pain, unspecified abdominal location    ED Discharge Orders    None       Flint Melter 12/22/18 2133    Davonna Belling, MD 12/25/18 [phone]

## 2018-12-24 ENCOUNTER — Telehealth: Payer: Self-pay | Admitting: Gastroenterology

## 2018-12-24 ENCOUNTER — Other Ambulatory Visit: Payer: Self-pay

## 2018-12-24 NOTE — Telephone Encounter (Signed)
Pt called and stated that he had an attack and had to go to [facility]. He is wanting to have a sooner appt than what is sched. Because he is in pain

## 2018-12-24 NOTE — Telephone Encounter (Signed)
The pt called to report he had to go to the ED for severe abd pain.  He has an appt with Dr Rush Landmark on 6/17.  The pt was advised to keep this appt and if he has a return of sever abd pain he should go to the ED for evaluation.  He states today he has no pain and feels good.  If things change he will notify our office.

## 2018-12-26 ENCOUNTER — Ambulatory Visit (HOSPITAL_COMMUNITY): Admit: 2018-12-26 | Payer: Medicare Other | Admitting: Gastroenterology

## 2018-12-26 ENCOUNTER — Encounter (HOSPITAL_COMMUNITY): Payer: Self-pay

## 2018-12-26 SURGERY — MANOMETRY, ESOPHAGUS
Anesthesia: Choice

## 2018-12-28 ENCOUNTER — Encounter: Payer: Self-pay | Admitting: Family Medicine

## 2018-12-28 ENCOUNTER — Other Ambulatory Visit: Payer: Self-pay

## 2018-12-28 ENCOUNTER — Ambulatory Visit (INDEPENDENT_AMBULATORY_CARE_PROVIDER_SITE_OTHER): Payer: Medicare Other | Admitting: Family Medicine

## 2018-12-28 VITALS — BP 119/60 | HR 62 | Wt 165.4 lb

## 2018-12-28 DIAGNOSIS — E43 Unspecified severe protein-calorie malnutrition: Secondary | ICD-10-CM | POA: Diagnosis not present

## 2018-12-28 DIAGNOSIS — K219 Gastro-esophageal reflux disease without esophagitis: Secondary | ICD-10-CM

## 2018-12-28 DIAGNOSIS — K860 Alcohol-induced chronic pancreatitis: Secondary | ICD-10-CM

## 2018-12-28 MED ORDER — DOCUSATE SODIUM 50 MG PO CAPS: 50.0000 mg | ORAL_CAPSULE | Freq: Two times a day (BID) | ORAL | 0 refills | Status: DC

## 2018-12-28 MED ORDER — FAMOTIDINE 20 MG PO TABS: 20.0000 mg | ORAL_TABLET | Freq: Every day | ORAL | 3 refills | Status: DC

## 2018-12-28 NOTE — Progress Notes (Signed)
Subjective: Chief Complaint  Patient presents with  . Hospitalization Follow-up  . low energy     HPI: Timothy Martinez is a 68 y.o. presenting to clinic today to discuss the following:  ED Visit Follow Up Patient is a 68y/o male with a known history of chronic pancreatitis and pancreatic pseudocyst. He was seen this past Sunday in the [facility] ED for nausea and vomiting (NBNB) and abdominal pain that started early that day. His work up was negative for an acute on chronic pancreatitis. He was given pain medication an anti-nausea medication in the ED and he improved dramatically and was discharged home. Since then he has had no more vomiting since. His pain level "stays at about a 4" but is not getting worse. He has lost some weight and his appetite since his ED visit is decreased.  He has an appointment with his GI Dr. Samella Parr for another EDG on 6/17. He did complain of burning that started in the middle of his abdomen and seemed to come up the middle of his chest that he think contributed to his nausea and vomiting. He believes it was GERD as he has no chest pain, SOB, difficulty breathing otherwise.  He denies fever, chills, cough, blood in the stool or urine. He denies diarrhea but is having constipation.      ROS noted in HPI.   Past Medical, Surgical, Social, and Family History Reviewed & Updated per EMR.   Pertinent Historical Findings include:   Social History   Tobacco Use  Smoking Status Current Every Day Smoker  . Packs/day: 1.00  . Years: 52.00  . Pack years: 52.00  . Types: Cigarettes  . Start date: 04/09/1964  Smokeless Tobacco Never Used  Tobacco Comment   down to one pack daily    Objective: BP 119/60   Pulse 62   Wt 165 lb 6.4 oz (75 kg)   SpO2 94%   BMI 20.67 kg/m  Vitals and nursing notes reviewed  Physical Exam Gen: Alert and Oriented x 3, NAD HEENT: Normocephalic, atraumatiic CV: RRR, no murmurs, normal S1, S2 split Resp: CTAB, no wheezing,  rales, or rhonchi, comfortable work of breathing Abd: non-distended, tender in the epigastrum, midline abdomen that radiates most to the RUQ, soft, +bs in all four quadrants Ext: no clubbing, cyanosis, or edema Skin: warm, dry, intact, no rashes  No results found for this or any previous visit (from the past 72 hour(s)).  Assessment/Plan:  Protein-calorie malnutrition, severe Continued weight loss despite Ensure BID - Ensure TID with focus on bland diet that is heavy on carbs. - Advance as tolerated and return if he cannot routinely keep food down.  GERD (gastroesophageal reflux disease) Patient has history of GERD with likely acute flare that may have contributed to his most recent episode of N/V. - Add famotidine 20mg  daily - Cont Pantoprazole 40mg  daily - If no improvement can increase pantoprazole to 40mg  BID for 8 weeks.  Chronic alcoholic pancreatitis ([place]) This latest episode of N/V with abdominal pain is possibly related to his chronic pancreatitis but his labs were not consistent with an acute flare. Interestingly enough, patient relates according to his last GI appointment they found "two spots in his stomach" but I can't find any record of this in Epic. - Cont to follow - Patient to return in one month after GI appointment and EDG results   PATIENT EDUCATION PROVIDED: See AVS    Diagnosis and plan along with any newly  prescribed medication(s) were discussed in detail with this patient today. The patient verbalized understanding and agreed with the plan. Patient advised if symptoms worsen return to clinic or ER.     No orders of the defined types were placed in this encounter.   Meds ordered this encounter  Medications  . docusate sodium (COLACE) 50 MG capsule    Sig: Take 1 capsule (50 mg total) by mouth 2 (two) times daily.    Dispense:  30 capsule    Refill:  0  . famotidine (PEPCID) 20 MG tablet    Sig: Take 1 tablet (20 mg total) by mouth daily.    Dispense:   30 tablet    Refill:  [place], DO 12/28/2018, 9:02 AM PGY-2 [place]

## 2018-12-28 NOTE — Patient Instructions (Signed)
It was great to see you today! Thank you for letting me participate in your care!  Today, we discussed your continued nausea and vomiting most likely due to chronic pancreatitis and pancreatic cyst. My goal is to get your appetite up and to stop your nausea and vomiting. Continue taking the Zofran you received from the ED as needed and call me if you need more. I have started you on a new medication for your heart burn called famotidine. Please take that along with your protonix. Your heartburn my also be contributing to your nausea and vomiting. I have also sent in for a stool softner to take along with miralax. Please follow up with me after you have seen your GI doctor.  Be well, Harolyn Rutherford, DO PGY-2, [facility]

## 2018-12-29 ENCOUNTER — Other Ambulatory Visit: Payer: Self-pay

## 2018-12-29 ENCOUNTER — Emergency Department (HOSPITAL_COMMUNITY): Payer: Medicare Other

## 2018-12-29 ENCOUNTER — Emergency Department (HOSPITAL_COMMUNITY)
Admission: EM | Admit: 2018-12-29 | Discharge: 2018-12-29 | Disposition: A | Discharge status: Home / Self Care | Payer: Medicare Other | Attending: Emergency Medicine | Admitting: Emergency Medicine

## 2018-12-29 ENCOUNTER — Encounter (HOSPITAL_COMMUNITY): Payer: Self-pay

## 2018-12-29 DIAGNOSIS — Z7984 Long term (current) use of oral hypoglycemic drugs: Secondary | ICD-10-CM | POA: Insufficient documentation

## 2018-12-29 DIAGNOSIS — I509 Heart failure, unspecified: Secondary | ICD-10-CM | POA: Diagnosis not present

## 2018-12-29 DIAGNOSIS — I110 Hypertensive heart disease with heart failure: Secondary | ICD-10-CM | POA: Diagnosis not present

## 2018-12-29 DIAGNOSIS — R10815 Periumbilic abdominal tenderness: Secondary | ICD-10-CM | POA: Diagnosis not present

## 2018-12-29 DIAGNOSIS — F17210 Nicotine dependence, cigarettes, uncomplicated: Secondary | ICD-10-CM | POA: Insufficient documentation

## 2018-12-29 DIAGNOSIS — K861 Other chronic pancreatitis: Secondary | ICD-10-CM | POA: Diagnosis not present

## 2018-12-29 DIAGNOSIS — Z79899 Other long term (current) drug therapy: Secondary | ICD-10-CM | POA: Insufficient documentation

## 2018-12-29 DIAGNOSIS — J449 Chronic obstructive pulmonary disease, unspecified: Secondary | ICD-10-CM | POA: Insufficient documentation

## 2018-12-29 DIAGNOSIS — J189 Pneumonia, unspecified organism: Secondary | ICD-10-CM

## 2018-12-29 DIAGNOSIS — R1013 Epigastric pain: Secondary | ICD-10-CM | POA: Diagnosis present

## 2018-12-29 DIAGNOSIS — J181 Lobar pneumonia, unspecified organism: Secondary | ICD-10-CM | POA: Diagnosis not present

## 2018-12-29 DIAGNOSIS — R112 Nausea with vomiting, unspecified: Secondary | ICD-10-CM | POA: Diagnosis not present

## 2018-12-29 DIAGNOSIS — I4891 Unspecified atrial fibrillation: Secondary | ICD-10-CM | POA: Insufficient documentation

## 2018-12-29 DIAGNOSIS — E119 Type 2 diabetes mellitus without complications: Secondary | ICD-10-CM | POA: Diagnosis not present

## 2018-12-29 DIAGNOSIS — R079 Chest pain, unspecified: Secondary | ICD-10-CM | POA: Diagnosis not present

## 2018-12-29 LAB — URINALYSIS, ROUTINE W REFLEX MICROSCOPIC
Bilirubin Urine: NEGATIVE
Glucose, UA: NEGATIVE mg/dL
Hgb urine dipstick: NEGATIVE
Ketones, ur: 5 mg/dL — AB
Leukocytes,Ua: NEGATIVE
Nitrite: NEGATIVE
Protein, ur: 100 mg/dL — AB
Specific Gravity, Urine: 1.031 — ABNORMAL HIGH (ref 1.005–1.030)
pH: 7 (ref 5.0–8.0)

## 2018-12-29 LAB — CBC WITH DIFFERENTIAL/PLATELET
Abs Immature Granulocytes: 0.02 10*3/uL (ref 0.00–0.07)
Basophils Absolute: 0.1 10*3/uL (ref 0.0–0.1)
Basophils Relative: 1 %
Eosinophils Absolute: 0.3 10*3/uL (ref 0.0–0.5)
Eosinophils Relative: 3 %
HCT: 43.7 % (ref 39.0–52.0)
Hemoglobin: 14.1 g/dL (ref 13.0–17.0)
Immature Granulocytes: 0 %
Lymphocytes Relative: 28 %
Lymphs Abs: 2.7 10*3/uL (ref 0.7–4.0)
MCH: 29.3 pg (ref 26.0–34.0)
MCHC: 32.3 g/dL (ref 30.0–36.0)
MCV: 90.7 fL (ref 80.0–100.0)
Monocytes Absolute: 1 10*3/uL (ref 0.1–1.0)
Monocytes Relative: 10 %
Neutro Abs: 5.5 10*3/uL (ref 1.7–7.7)
Neutrophils Relative %: 58 %
Platelets: 345 10*3/uL (ref 150–400)
RBC: 4.82 MIL/uL (ref 4.22–5.81)
RDW: 14.3 % (ref 11.5–15.5)
WBC: 9.6 10*3/uL (ref 4.0–10.5)
nRBC: 0 % (ref 0.0–0.2)

## 2018-12-29 LAB — COMPREHENSIVE METABOLIC PANEL
ALT: 13 U/L (ref 0–44)
AST: 17 U/L (ref 15–41)
Albumin: 3.8 g/dL (ref 3.5–5.0)
Alkaline Phosphatase: 86 U/L (ref 38–126)
Anion gap: 12 (ref 5–15)
BUN: 15 mg/dL (ref 8–23)
CO2: 27 mmol/L (ref 22–32)
Calcium: 9.8 mg/dL (ref 8.9–10.3)
Chloride: 102 mmol/L (ref 98–111)
Creatinine, Ser: 0.9 mg/dL (ref 0.61–1.24)
GFR calc Af Amer: >60 mL/min (ref >60)
GFR calc non Af Amer: >60 mL/min (ref >60)
Glucose, Bld: 118 mg/dL — ABNORMAL HIGH (ref 70–99)
Potassium: 4.2 mmol/L (ref 3.5–5.1)
Sodium: 141 mmol/L (ref 135–145)
Total Bilirubin: 0.5 mg/dL (ref 0.3–1.2)
Total Protein: 7.8 g/dL (ref 6.5–8.1)

## 2018-12-29 LAB — TROPONIN I: Troponin I: <0.03 ng/mL (ref <0.03)

## 2018-12-29 LAB — LIPASE, BLOOD: Lipase: 163 U/L — ABNORMAL HIGH (ref 11–51)

## 2018-12-29 MED ORDER — ALUM & MAG HYDROXIDE-SIMETH 200-200-20 MG/5ML PO SUSP
30.0000 mL | Freq: Once | ORAL | Status: AC
  Administered 2018-12-29: 30 mL via ORAL
  Filled 2018-12-29: qty 30

## 2018-12-29 MED ORDER — HYDROCODONE-ACETAMINOPHEN 5-325 MG PO TABS: 1.0000 | ORAL_TABLET | Freq: Three times a day (TID) | ORAL | 0 refills | Status: DC | PRN

## 2018-12-29 MED ORDER — DOXYCYCLINE HYCLATE 100 MG PO CAPS: 100.0000 mg | ORAL_CAPSULE | Freq: Two times a day (BID) | ORAL | 0 refills | Status: AC

## 2018-12-29 MED ORDER — ONDANSETRON HCL 4 MG/2ML IJ SOLN
4.0000 mg | Freq: Once | INTRAMUSCULAR | Status: AC
  Administered 2018-12-29: 4 mg via INTRAVENOUS
  Filled 2018-12-29: qty 2

## 2018-12-29 MED ORDER — FAMOTIDINE IN NACL 20-0.9 MG/50ML-% IV SOLN
20.0000 mg | Freq: Once | INTRAVENOUS | Status: AC
  Administered 2018-12-29: 20 mg via INTRAVENOUS
  Filled 2018-12-29: qty 50

## 2018-12-29 MED ORDER — SODIUM CHLORIDE 0.9 % IV BOLUS
1000.0000 mL | Freq: Once | INTRAVENOUS | Status: AC
  Administered 2018-12-29: 1000 mL via INTRAVENOUS

## 2018-12-29 MED ORDER — HYDROMORPHONE HCL 1 MG/ML IJ SOLN
1.0000 mg | Freq: Once | INTRAMUSCULAR | Status: AC
  Administered 2018-12-29: 1 mg via INTRAVENOUS
  Filled 2018-12-29: qty 1

## 2018-12-29 NOTE — ED Notes (Signed)
The pts pain is much better

## 2018-12-29 NOTE — Discharge Instructions (Addendum)
You were given a prescription for antibiotics. Please take the antibiotic prescription fully.   Please follow-up with your regular doctor in regards to your diagnosis of pneumonia.  You will need a repeat chest x-ray in 3 to 4 weeks to check resolution of your pneumonia.  Please also contact your gastroenterologist in regards to your abdominal pain.  Return to the emergency department for any severe abdominal pain, persistent vomiting, fevers or any new or worsening symptoms.

## 2018-12-29 NOTE — ED Triage Notes (Signed)
Pt presents with c/o a "pancreas attack, and the only thing that works for it is dilaudid". Pt states he was seen here this past Sunday for the same. Pt endorses x1 episode of vomiting this am. Pt also endorses constant abdominal pain since 1130 this am.

## 2018-12-29 NOTE — ED Notes (Signed)
gingerale given  To pt

## 2018-12-29 NOTE — ED Notes (Signed)
Pt made aware of need for urine sample, states unable to void at this time, urinal and call light at bedside.

## 2018-12-29 NOTE — ED Provider Notes (Signed)
[facility] EMERGENCY DEPARTMENT Provider Note   CSN: [id number] Arrival date & time: 12/29/18  1304    History   Chief Complaint Chief Complaint  Patient presents with  . Abdominal Pain  . Emesis    HPI CHICK COUSINS is a 68 y.o. male.     HPI   Pt is a 68 y/o male with a h/o prior EtOH abuse, alcoholic pancreatitis, A. fib, CHF, obstructive lung disease, gastric outlet obstruction, GERD, hyperlipidemia, hypertension, diabetes, tobacco use, who presents to the ED today for eval of abd pain that began 1 hour PTA. Pain located to the epigastrium. Rates pain 12/10. Pain is constant. Pain radiates to his chest. Feels similar to prior episodes of pancreatitis. Pain associated with nausea, vomiting. Denies constipation, diarrhea, urinary sxs, fevers. No SOB. States he has a chronic cough that is unchanged.   States that pt has not drank ETOH since 02/23/18.  Per chart review, pt recently admitted for similar. CT abd/pelvis with increasing pancreatic pseudocyst causing gastric outlet obstruction. During hospital stay underwent EGD and had biopsies of the stomach.  H. pylori is negative. patient was supposed to have a follow-up endoscopic ultrasound to sample pancreatic duct nodularity however he declined and is scheduled to follow-up with [facility] Dr. Rush Landmark on 6/17.  Past Medical History:  Diagnosis Date  . Acute on chronic pancreatitis ([place]) 05/17/2016  . Alcohol abuse   . Alcoholic pancreatitis   . Anginal pain ([place])   . Anxiety and depression 01/20/2015   PHQ 9 = 11 (01/20/15)    . Arthritis    "eat up w/it" (05/17/2016)  . Atrial fibrillation status post cardioversion ([place]) 08/2014  . Atrial flutter, unspecified   . CHF (congestive heart failure) ([place])   . Chronic anticoagulation   . Chronic atrial fibrillation   . Chronic bronchitis ([place])   . Chronic left-sided low back pain with sciatica 12/21/2012  . COLD (chronic obstructive lung disease) ([place])   .  Daily headache    "need eye examined" (05/17/2016)  . Gastric outlet obstruction 10/2018  . GERD (gastroesophageal reflux disease)   . Hematuria, microscopic 02/13/2015   Noted on UA - Recheck UA at next visit ~ 1 months   . Hyperlipidemia   . Hypertension   . Insomnia 02/11/2016  . Lateral epicondylitis of right elbow 03/17/2017  . Left foot pain 10/12/2013  . Loss of weight 02/19/2018  . Low back pain 12/21/2012  . Malnutrition of moderate degree 05/18/2016  . Neck pain, bilateral posterior 10/15/2014  . Neuropathy 03/16/2015  . Pain of joint of left ankle and foot 06/13/2018  . Pain of right upper extremity 05/30/2016  . Pancreatic pseudocyst    seen on CT scan 07/2014  . Pancreatitis 08/04/2014  . Permanent atrial fibrillation 05/05/2017  . Pneumonia    "?a few times" (05/17/2016)  . 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 Tobacco abuse 05/30/2011   Heavy smoker up to 3 ppd down to 15 cigs per day in 09/2014   . Type II diabetes mellitus [facility])     Patient Active Problem List   Diagnosis Date Noted  . Hospital discharge follow-up 11/30/2018  . Hypoglycemia associated with diabetes ([place]) 11/21/2018  . Esophagitis determined by endoscopy 11/21/2018  . Gastric ulcer 11/21/2018  . Gastric outlet obstruction 11/19/2018  . Hypovolemia dehydration 11/19/2018  . History of colonic polyps 05/25/2018  . Dilation of pancreatic duct 04/16/2018  . 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 Protein-calorie malnutrition, severe 03/22/2018  .  Other partial intestinal obstruction ([place])   . Atrial fibrillation with RVR ([place]) 03/21/2018  . Pseudocyst of pancreas 03/11/2018  . Solitary pulmonary nodule 05/12/2017  . On continuous oral anticoagulation 06/19/2015  . Type 2 diabetes mellitus without complication ([place])   . Emphysema lung ([place]) 04/27/2012  . GERD (gastroesophageal reflux disease) 03/05/2012  . Alcohol use disorder, mild, in early remission, abuse 06/09/2011  . Chronic alcoholic pancreatitis ([place]) 05/30/2011  .  Tobacco abuse 05/30/2011    Past Surgical History:  Procedure Laterality Date  . BACK SURGERY    . BIOPSY  11/20/2018   Procedure: BIOPSY;  Surgeon: Rush Landmark Telford Nab., MD;  Location: [facility];  Service: Gastroenterology;;  . CARDIOVERSION N/A 09/22/2014   Procedure: CARDIOVERSION;  Surgeon: Thayer Headings, MD;  Location: [facility];  Service: Cardiovascular;  Laterality: N/A;  . COLONOSCOPY W/ POLYPECTOMY  2019  . ESOPHAGOGASTRODUODENOSCOPY (EGD) WITH PROPOFOL N/A 11/20/2018   Procedure: ESOPHAGOGASTRODUODENOSCOPY (EGD) WITH PROPOFOL;  Surgeon: Rush Landmark Telford Nab., MD;  Location: [facility];  Service: Gastroenterology;  Laterality: N/A;  . FOREIGN BODY REMOVAL  11/20/2018   Procedure: FOREIGN BODY REMOVAL;  Surgeon: Rush Landmark Telford Nab., MD;  Location: [facility];  Service: Gastroenterology;;  . [place]   not cervical, states had lesion on neck which was removed  . POSTERIOR LAMINECTOMY / DECOMPRESSION LUMBAR SPINE  10/2006   of L4, L5 and S1 with a 5-1 diskectomy, microdissection with the microscope/notes 10/24/2006  . SPLENECTOMY  2003  . UPPER ESOPHAGEAL ENDOSCOPIC ULTRASOUND (EUS) N/A 11/20/2018   Procedure: UPPER ESOPHAGEAL ENDOSCOPIC ULTRASOUND (EUS);  Surgeon: Irving Copas., MD;  Location: [facility];  Service: Gastroenterology;  Laterality: N/A;        Home Medications    Prior to Admission medications   Medication Sig Start Date End Date Taking? Authorizing Provider  acetaminophen (TYLENOL) 325 MG tablet Take 2 tablets (650 mg total) by mouth every 6 (six) hours. 03/23/18  Yes Meccariello, Bernita Raisin, DO  amitriptyline (ELAVIL) 10 MG tablet TAKE 1 TABLET BY MOUTH EVERYDAY AT BEDTIME Patient taking differently: Take 10 mg by mouth at bedtime.  12/02/18  Yes [name], DO  atorvastatin (LIPITOR) 40 MG tablet Take 0.5 tablets (20 mg total) by mouth daily at 6 PM. 11/23/18  Yes Beard, Samantha N, DO  cyanocobalamin 500 MCG tablet Take  500 mcg by mouth daily.   Yes [provider]  feeding supplement, ENSURE ENLIVE, (ENSURE ENLIVE) LIQD Take 237 mLs by mouth 3 (three) times daily between meals. Patient taking differently: Take 237 mLs by mouth 2 (two) times daily between meals.  11/23/18  Yes Beard, Samantha N, DO  gabapentin (NEURONTIN) 100 MG capsule TAKE 1 CAPSULE BY MOUTH THREE TIMES A DAY Patient taking differently: Take 100 mg by mouth 3 (three) times daily.  03/14/18  Yes Lockamy, Timothy, DO  magnesium oxide (MAG-OX) 400 MG tablet TAKE 1 TABLET BY MOUTH EVERY DAY Patient taking differently: Take 400 mg by mouth daily.  11/20/18  Yes [name], DO  metFORMIN (GLUCOPHAGE) 1000 MG tablet Take 1 tablet (1,000 mg total) by mouth 2 (two) times daily with a meal. 02/19/18  Yes Lockamy, Timothy, DO  metoprolol tartrate (LOPRESSOR) 50 MG tablet Take 1.5 tablets (75 mg total) by mouth 2 (two) times daily. 02/19/18  Yes Lockamy, Timothy, DO  mometasone-formoterol (DULERA) 200-5 MCG/ACT AERO Inhale 2 puffs into the lungs 2 (two) times daily. 09/27/18 12/29/18 Yes [name], DO  Multiple Vitamin (MULTIVITAMIN WITH MINERALS)  TABS tablet Take 1 tablet by mouth daily. 11/24/18  Yes Patriciaann Clan, DO  nitroGLYCERIN (NITROSTAT) 0.4 MG SL tablet Place 1 tablet (0.4 mg total) under the tongue every 5 (five) minutes as needed for chest pain. 03/23/18 03/23/19 Yes Meccariello, Bernita Raisin, DO  ondansetron (ZOFRAN ODT) 4 MG disintegrating tablet Take 1 tablet (4 mg total) by mouth every 8 (eight) hours as needed for nausea or vomiting. 12/22/18  Yes Albrizze, Kaitlyn E, PA-C  pantoprazole (PROTONIX) 40 MG tablet Take 1 tablet (40 mg total) by mouth daily. 10/01/18  Yes Lockamy, Timothy, DO  polyethylene glycol (MIRALAX / GLYCOLAX) 17 g packet TAKE 17 G BY MOUTH DAILY. Patient taking differently: Take 17 g by mouth daily as needed.  11/14/18  Yes Lockamy, Timothy, DO  XARELTO 20 MG TABS tablet TAKE 1 TABLET BY MOUTH DAILY WITH SUPPER  Patient taking differently: Take 20 mg by mouth 2 (two) times a day.  09/25/18  Yes Lockamy, Timothy, DO  Blood Glucose Monitoring Suppl (ONE TOUCH ULTRA 2) w/Device KIT Dispense one glucometer. Diagnosis Type 2 Diabetes. E11.9 06/13/18   Rory Percy, DO  docusate sodium (COLACE) 50 MG capsule Take 1 capsule (50 mg total) by mouth 2 (two) times daily. Patient not taking: Reported on 12/29/2018 12/28/18   [name], DO  doxycycline (VIBRAMYCIN) 100 MG capsule Take 1 capsule (100 mg total) by mouth 2 (two) times daily for 7 days. 12/29/18 01/05/19  Zenya Hickam S, PA-C  famotidine (PEPCID) 20 MG tablet Take 1 tablet (20 mg total) by mouth daily. Patient not taking: Reported on 12/29/2018 12/28/18   [name], DO  glucose blood (ONE TOUCH ULTRA TEST) test strip Use check fasting blood glucose qAM. Type 2 Diabetes E11.9 10/01/18   [name], DO  HYDROcodone-acetaminophen (NORCO/VICODIN) 5-325 MG tablet Take 1 tablet by mouth every 8 (eight) hours as needed. 12/29/18   Sumaya Riedesel S, PA-C  Lancets (FREESTYLE) lancets Use as instructed 10/02/18   [name], DO  Lancets (ONETOUCH ULTRASOFT) lancets Use as instructed 10/02/18   Lockamy, Timothy, DO  ondansetron (ZOFRAN) 4 MG tablet Take 1 tablet (4 mg total) by mouth every 8 (eight) hours as needed for nausea or vomiting. Patient not taking: Reported on 11/18/2018 03/20/18   [name], DO    Family History Family History  Problem Relation Age of Onset  . Diabetes Mother   . Aneurysm Mother   . Stroke Mother   . Hypertension Mother   . Alcohol abuse Father   . Cancer Father        Lung  . Cancer Sister        Lung Cancer  . Post-traumatic stress disorder Brother   . Heart attack Neg Hx   . Colon cancer Neg Hx   . Liver cancer Neg Hx   . Rectal cancer Neg Hx   . Esophageal cancer Neg Hx   . Stomach cancer Neg Hx   . Inflammatory bowel disease Neg Hx     Social History Social History   Tobacco Use  . Smoking  status: Current Every Day Smoker    Packs/day: 1.00    Years: 52.00    Pack years: 52.00    Types: Cigarettes    Start date: 04/09/1964  . Smokeless tobacco: Never Used  . Tobacco comment: down to one pack daily  Substance Use Topics  . Alcohol use: Not Currently    Comment: QUIT IN 02/2018  . Drug use: No  Allergies   Prozac [fluoxetine hcl]; Morphine and related; and Penicillins   Review of Systems Review of Systems  Constitutional: Negative for chills and fever.  HENT: Negative for ear pain and sore throat.   Eyes: Negative for pain and visual disturbance.  Respiratory: Negative for cough and shortness of breath.   Cardiovascular: Positive for chest pain. Negative for palpitations.  Gastrointestinal: Positive for abdominal pain, nausea and vomiting. Negative for blood in stool, constipation and diarrhea.  Genitourinary: Negative for dysuria and hematuria.  Musculoskeletal: Negative for arthralgias and back pain.  Skin: Negative for rash.  Neurological: Negative for seizures, syncope and headaches.  All other systems reviewed and are negative.    Physical Exam Updated Vital Signs BP 134/78 (BP Location: Right Arm)   Pulse (!) 57   Temp 97.9 F (36.6 C) (Oral)   Resp (!) 25   Ht _0  (1.905 m)   Wt 74.8 kg   SpO2 95%   BMI 20.62 kg/m   Physical Exam Vitals signs and nursing note reviewed.  Constitutional:      Appearance: He is well-developed.  HENT:     Head: Normocephalic and atraumatic.  Eyes:     Conjunctiva/sclera: Conjunctivae normal.  Neck:     Musculoskeletal: Neck supple.  Cardiovascular:     Rate and Rhythm: Normal rate and regular rhythm.     Heart sounds: Normal heart sounds. No murmur.  Pulmonary:     Effort: Pulmonary effort is normal. No respiratory distress.     Breath sounds: Normal breath sounds. No wheezing, rhonchi or rales.  Abdominal:     General: Bowel sounds are normal.     Palpations: Abdomen is soft.     Tenderness: There  is abdominal tenderness in the epigastric area and periumbilical area.  Skin:    General: Skin is warm and dry.  Neurological:     Mental Status: He is alert.      ED Treatments / Results  Labs (all labs ordered are listed, but only abnormal results are displayed) Labs Reviewed  COMPREHENSIVE METABOLIC PANEL - Abnormal; Notable for the following components:      Result Value   Glucose, Bld 118 (*)    All other components within normal limits  LIPASE, BLOOD - Abnormal; Notable for the following components:   Lipase 163 (*)    All other components within normal limits  URINALYSIS, ROUTINE W REFLEX MICROSCOPIC - Abnormal; Notable for the following components:   Color, Urine AMBER (*)    Specific Gravity, Urine 1.031 (*)    Ketones, ur 5 (*)    Protein, ur 100 (*)    Bacteria, UA RARE (*)    Non Squamous Epithelial 0-5 (*)    All other components within normal limits  CBC WITH DIFFERENTIAL/PLATELET  TROPONIN I    EKG EKG Interpretation  Date/Time:  Saturday December 29 2018 13:13:56 EDT Ventricular Rate:  57 PR Interval:    QRS Duration: 97 QT Interval:  416 QTC Calculation: 405 R Axis:   81 Text Interpretation:  Sinus rhythm Borderline right axis deviation No significant change since last tracing Confirmed by Theotis Burrow [phone]) on 12/29/2018 3:30:08 PM   Radiology Dg Chest Portable 1 View  Result Date: 12/29/2018 CLINICAL DATA:  Chest pain EXAM: PORTABLE CHEST 1 VIEW COMPARISON:  November 18, 2018 FINDINGS: There is fibrosis throughout lungs bilaterally, primarily in the more peripheral areas. There is a focal area of airspace consolidation in the periphery of the left upper  lobe. No similar areas of consolidation elsewhere. Heart size and pulmonary vascularity are normal. No adenopathy. There is aortic atherosclerosis. No bone lesions evident. IMPRESSION: Widespread interstitial fibrosis. Focal area of presumed pneumonia in the periphery of the left upper lobe. Heart size and  pulmonary vascularity are normal. No adenopathy. Aortic Atherosclerosis (ICD10-I70.0). Followup PA and lateral chest radiographs recommended in 3-4 weeks following trial of antibiotic therapy to ensure resolution and exclude underlying malignancy. Electronically Signed   By: Lowella Grip III M.D.   On: 12/29/2018 16:01    Procedures Procedures (including critical care time)  Medications Ordered in ED Medications  HYDROmorphone (DILAUDID) injection 1 mg (1 mg Intravenous Given 12/29/18 1435)  ondansetron (ZOFRAN) injection 4 mg (4 mg Intravenous Given 12/29/18 1435)  alum & mag hydroxide-simeth (MAALOX/MYLANTA) 200-200-20 MG/5ML suspension 30 mL (30 mLs Oral Given 12/29/18 1436)  famotidine (PEPCID) IVPB 20 mg premix (0 mg Intravenous Stopped 12/29/18 1504)  sodium chloride 0.9 % bolus 1,000 mL (0 mLs Intravenous Stopped 12/29/18 1552)     Initial Impression / Assessment and Plan / ED Course  I have reviewed the triage vital signs and the nursing notes.  Pertinent labs & imaging results that were available during my care of the patient were reviewed by me and considered in my medical decision making (see chart for details).     Final Clinical Impressions(s) / ED Diagnoses   Final diagnoses:  Chronic pancreatitis, unspecified pancreatitis type ([place])  Community acquired pneumonia of left upper lobe of lung ([place])   Patient with history of chronic pancreatitis and pancreatic pseudocyst causing gastric outlet obstruction presenting to the ED today complaining of epigastric abdominal pain and vomiting.  Has epigastric ttp on exam. VS reassuring.  CBC without leukocytosis, no anemia CMP nonacute Lipase is elevated consistent with panreatitis Trop is negative UA pending at time of shift change.  EKG with NSR HR 57. Borderline right axis deviation.   CXR widespread interstitial fibrosis. Focal area of presumed pneumonia in the periphery of the left upper lobe. Heart size and pulmonary  vascularity are normal. No adenopathy. Aortic Atherosclerosis (ICD10-I70.0). Followup PA and lateral chest radiographs recommended in 3-4 weeks following trial of antibiotic therapy to ensure resolution and exclude underlying malignancy.   Given IVF, pain meds, zofran, pepcid, gi cocktail, and on reassessment patient states he feels much improved.  He has been able to tolerate p.o. without difficulty and states that his pain is almost completely resolved.  I discussed findings of his work-up including possible pneumonia and recurrent pancreatitis.  He feels comfortable with plan to be discharged on antibiotics and pain medicine.  Reviewed patient [facility] narcotic database and there are no red flags.  He agrees to call his GI doctor on Monday to try to get an earlier appointment.  I advised him to return to the ER for persistent/severe pain, intractable vomiting or any new or worsening symptoms.  He voiced understand the plan and reasons return.  All Questions answered.  Patient stable for discharge.  ED Discharge Orders         Ordered    HYDROcodone-acetaminophen (NORCO/VICODIN) 5-325 MG tablet  Every 8 hours PRN     12/29/18 1628    doxycycline (VIBRAMYCIN) 100 MG capsule  2 times daily     12/29/18 [address] Fawne Hughley S, PA-C 12/29/18 1629    Gareth Morgan, MD 12/30/18 [phone]

## 2018-12-29 NOTE — ED Notes (Signed)
Pt verbalized understanding of d/c instructions and no further questions. Pt to follow up with pcp in 3 weeks for repeat chest x-ray. VSS, NAD

## 2018-12-31 ENCOUNTER — Telehealth: Payer: Self-pay | Admitting: Gastroenterology

## 2018-12-31 NOTE — Telephone Encounter (Signed)
Pt states he had to go back to the ER a few days ago. States he vomited and then started having pain, pancreatitis was back. Pt was given antinausea meds and pan medication. Pt wants to know if there is something he needs to do to try and keep this from happening again prior to his scheduled OV appt. Please advise.

## 2018-12-31 NOTE — Assessment & Plan Note (Signed)
Patient has history of GERD with likely acute flare that may have contributed to his most recent episode of N/V. - Add famotidine 20mg  daily - Cont Pantoprazole 40mg  daily - If no improvement can increase pantoprazole to 40mg  BID for 8 weeks.

## 2018-12-31 NOTE — Assessment & Plan Note (Signed)
Continued weight loss despite Ensure BID - Ensure TID with focus on bland diet that is heavy on carbs. - Advance as tolerated and return if he cannot routinely keep food down.

## 2018-12-31 NOTE — Assessment & Plan Note (Signed)
This latest episode of N/V with abdominal pain is possibly related to his chronic pancreatitis but his labs were not consistent with an acute flare. Interestingly enough, patient relates according to his last GI appointment they found "two spots in his stomach" but I can't find any record of this in Epic. - Cont to follow - Patient to return in one month after GI appointment and EDG results

## 2019-01-01 ENCOUNTER — Other Ambulatory Visit: Payer: Self-pay

## 2019-01-01 DIAGNOSIS — K861 Other chronic pancreatitis: Secondary | ICD-10-CM

## 2019-01-01 MED ORDER — ONDANSETRON HCL 8 MG PO TABS: 8.0000 mg | ORAL_TABLET | Freq: Three times a day (TID) | ORAL | 0 refills | Status: DC | PRN

## 2019-01-01 MED ORDER — PROCHLORPERAZINE MALEATE 10 MG PO TABS: 10.0000 mg | ORAL_TABLET | Freq: Three times a day (TID) | ORAL | 2 refills | Status: AC | PRN

## 2019-01-01 NOTE — Telephone Encounter (Signed)
Pt aware of Dr. Donneta Romberg recommendations. Scripts sent to pharmacy. Pt scheduled for CT of abd at [facility] 01/07/19@8 :30am. Pt to be NPO after midnight. Pt will drink contrast at 7:30am. Pt to go by [facility] radiology prior to appt to pick up contrast. Orders for labs in epic.

## 2019-01-01 NOTE — Telephone Encounter (Signed)
Thank you Vaughan Basta, I ment 10 mg tablets. GM

## 2019-01-01 NOTE — Telephone Encounter (Signed)
Please get patient Zofran 8 mg Disintegrating tablets Q8H PRN (30 tabs with 2 refills). Please get patient Compazine 12.5 mg tablets Q8H PRN (30 tabs with 2 refills). Ideally, patient takes and uses on Zofran but if necessary can add Compazine to this intermittently. He needs to have a CMP + Mg + Phos performed weekly to make sure electrolytes look OK while taking. He should have a CT-Abdomen with contrast performed 2-days before clinic visit to see if there is a developing fluid collection. Thanks. GM

## 2019-01-01 NOTE — Telephone Encounter (Signed)
Dr. Rush Landmark compazine comes in 10mg  tablets, is that what you want the pt to have?

## 2019-01-03 ENCOUNTER — Ambulatory Visit (INDEPENDENT_AMBULATORY_CARE_PROVIDER_SITE_OTHER): Payer: Medicare Other | Admitting: Family Medicine

## 2019-01-03 ENCOUNTER — Other Ambulatory Visit: Payer: Self-pay

## 2019-01-03 ENCOUNTER — Encounter: Payer: Self-pay | Admitting: Family Medicine

## 2019-01-03 DIAGNOSIS — K860 Alcohol-induced chronic pancreatitis: Secondary | ICD-10-CM

## 2019-01-03 MED ORDER — FAMOTIDINE 20 MG PO TABS: 20.0000 mg | ORAL_TABLET | Freq: Every day | ORAL | 3 refills | Status: DC

## 2019-01-03 MED ORDER — AMITRIPTYLINE HCL 10 MG PO TABS: 10.0000 mg | ORAL_TABLET | Freq: Every day | ORAL | 0 refills | Status: DC

## 2019-01-03 MED ORDER — OXYCODONE-ACETAMINOPHEN 5-325 MG PO TABS: 1.0000 | ORAL_TABLET | ORAL | 0 refills | Status: AC | PRN

## 2019-01-03 NOTE — Assessment & Plan Note (Signed)
Continues to have acute on chronic episodes. Unclear how much is related to his chronic pancreatitis vs his pancreatic pseudocyst possibly causing obstruction. - He has moved up his GI appointment to this Wednesday, he will call me and update me on results. Hopefully, his EDG will help determine what course of action we can take. - Possible referral to Interventional Radiology for drainage of pseudocyst? Will await GI recs - Cont Zofran 8mg  prn for nausea and vomiting - 5 days of Percocet 5-325mg  given to help prevent ED visits and hospitalization - F/u in less than one month after GI results

## 2019-01-03 NOTE — Patient Instructions (Signed)
It was great to see you today! Thank you for letting me participate in your care!  Today, we discussed your continued nausea, vomiting, and abdominal pain most likely caused by pancreatitis. I have sent in more pain medication to help control your symptoms. Please take it as directed.  I have also sent in a prescription for FAMOTIDINE. It is a medication for acid reflux and I am hoping it will help with your symptoms.  I have also refilled amitriptyline to take at night which may also help with your upset stomach.   Please stay in close contact with me and let me know the results of your GI visit.  Be well, Harolyn Rutherford, DO PGY-2, [facility]

## 2019-01-03 NOTE — Progress Notes (Signed)
Subjective: Chief Complaint  Patient presents with  . Hospitalization Follow-up    HPI: Timothy Martinez is a 68 y.o. presenting to clinic today to discuss the following:  ED Follow Up Timothy Martinez is a 68y/o male with PMH of chronic pancreatitis that returns to clinic today for another episode of having to go to the ED due to uncontrolled vomiting and abdominal pain. In the ED he had a lipase of 163 and CXR that showed concern for developing pneumonia. He was given IV Zofran, Dilaudid, and started on an antibiotic due to concern for a developing PNA seen on CXR. Today he reports he is feeling much better and his pain is controlled. He was able to eat this morning and has been able to keep his food down for the past few days. He still has some nausea and his appetite remains poor.   He denies fever, chills, vomiting, abdominal pain, urinary frequency or pain, constipation or diarrhea.     ROS noted in HPI.   Past Medical, Surgical, Social, and Family History Reviewed & Updated per EMR.   Pertinent Historical Findings include:   Social History   Tobacco Use  Smoking Status Current Every Day Smoker  . Packs/day: 1.00  . Years: 52.00  . Pack years: 52.00  . Types: Cigarettes  . Start date: 04/09/1964  Smokeless Tobacco Never Used  Tobacco Comment   down to one pack daily    Objective: BP 122/72   Pulse 69   SpO2 99%  Vitals and nursing notes reviewed  Physical Exam Gen: Alert and Oriented x 3, NAD HEENT: Normocephalic, atraumatic CV: RRR, no murmurs, normal S1, S2 split Resp: CTAB, no wheezing, rales, or rhonchi, comfortable work of breathing Abd: non-distended, tender in RUQ, LUQ, and epigastric area, soft, +bs in all four quadrants Ext: no clubbing, cyanosis, or edema Skin: warm, dry, intact, no rashes  Assessment/Plan:  Chronic alcoholic pancreatitis (HCC) Continues to have acute on chronic episodes. Unclear how much is related to his chronic pancreatitis vs his  pancreatic pseudocyst possibly causing obstruction. - He has moved up his GI appointment to this Wednesday, he will call me and update me on results. Hopefully, his EDG will help determine what course of action we can take. - Possible referral to Interventional Radiology for drainage of pseudocyst? Will await GI recs - Cont Zofran 8mg  prn for nausea and vomiting - 5 days of Percocet 5-325mg  given to help prevent ED visits and hospitalization - F/u in less than one month after GI results   PATIENT EDUCATION PROVIDED: See AVS    Diagnosis and plan along with any newly prescribed medication(s) were discussed in detail with this patient today. The patient verbalized understanding and agreed with the plan. Patient advised if symptoms worsen return to clinic or ER.     Meds ordered this encounter  Medications  . oxyCODONE-acetaminophen (PERCOCET/ROXICET) 5-325 MG tablet    Sig: Take 1-2 tablets by mouth every 4 (four) hours as needed for up to 5 days for severe pain.    Dispense:  30 tablet    Refill:  0  . famotidine (PEPCID) 20 MG tablet    Sig: Take 1 tablet (20 mg total) by mouth daily.    Dispense:  30 tablet    Refill:  3  . amitriptyline (ELAVIL) 10 MG tablet    Sig: Take 1 tablet (10 mg total) by mouth at bedtime.    Dispense:  30 tablet  Refill:  0     Harolyn Rutherford, DO 01/03/2019, 8:34 AM PGY-2 [place]

## 2019-01-07 ENCOUNTER — Ambulatory Visit (HOSPITAL_COMMUNITY)
Admission: RE | Admit: 2019-01-07 | Discharge: 2019-01-07 | Disposition: A | Discharge status: Home / Self Care | Payer: Medicare Other | Source: Ambulatory Visit | Attending: Gastroenterology | Admitting: Gastroenterology

## 2019-01-07 ENCOUNTER — Other Ambulatory Visit: Payer: Self-pay

## 2019-01-07 DIAGNOSIS — K861 Other chronic pancreatitis: Secondary | ICD-10-CM | POA: Diagnosis not present

## 2019-01-07 DIAGNOSIS — K859 Acute pancreatitis without necrosis or infection, unspecified: Secondary | ICD-10-CM | POA: Diagnosis not present

## 2019-01-07 DIAGNOSIS — K6389 Other specified diseases of intestine: Secondary | ICD-10-CM | POA: Diagnosis not present

## 2019-01-07 DIAGNOSIS — K862 Cyst of pancreas: Secondary | ICD-10-CM | POA: Diagnosis not present

## 2019-01-07 MED ORDER — SODIUM CHLORIDE (PF) 0.9 % IJ SOLN
INTRAMUSCULAR | Status: AC
  Filled 2019-01-07: qty 50

## 2019-01-07 MED ORDER — IOHEXOL 300 MG/ML  SOLN
100.0000 mL | Freq: Once | INTRAMUSCULAR | Status: AC | PRN
  Administered 2019-01-07: 100 mL via INTRAVENOUS

## 2019-01-09 ENCOUNTER — Other Ambulatory Visit: Payer: Self-pay

## 2019-01-09 ENCOUNTER — Ambulatory Visit (INDEPENDENT_AMBULATORY_CARE_PROVIDER_SITE_OTHER): Payer: Medicare Other | Admitting: Gastroenterology

## 2019-01-09 VITALS — Ht 75.0 in | Wt 165.0 lb

## 2019-01-09 DIAGNOSIS — K8689 Other specified diseases of pancreas: Secondary | ICD-10-CM | POA: Diagnosis not present

## 2019-01-09 DIAGNOSIS — R935 Abnormal findings on diagnostic imaging of other abdominal regions, including retroperitoneum: Secondary | ICD-10-CM

## 2019-01-09 DIAGNOSIS — K219 Gastro-esophageal reflux disease without esophagitis: Secondary | ICD-10-CM

## 2019-01-09 DIAGNOSIS — K860 Alcohol-induced chronic pancreatitis: Secondary | ICD-10-CM | POA: Diagnosis not present

## 2019-01-09 DIAGNOSIS — K863 Pseudocyst of pancreas: Secondary | ICD-10-CM

## 2019-01-09 DIAGNOSIS — F17200 Nicotine dependence, unspecified, uncomplicated: Secondary | ICD-10-CM

## 2019-01-09 NOTE — H&P (View-Only) (Signed)
° °GASTROENTEROLOGY OUTPATIENT CLINIC VISIT  ° °Primary Care Provider °Lockamy, Timothy, DO °[address] °[place] [address] °[phone] ° °Patient Profile: °Timothy Martinez is a 68 y.o. male with a pmh significant for atrial fibrillation (on Xarelto), diabetes, status post splenectomy, recurrent acute on chronic pancreatitis secondary to alcohol consumption/alcohol abuse (manifested by dilated pancreas duct and pancreatic duct stone disease), COPD, GERD, arthritis, colon polyps (SSPs), chronic tobacco use.  The patient presents to the [facility] for an evaluation and management of problem(s) noted below: ° °Problem List °1. Chronic alcoholic pancreatitis (HCC)   °2. Dilation of pancreatic duct   °3. Pancreatic pseudocyst   °4. Gastroesophageal reflux disease, esophagitis presence not specified   °5. Tobacco use disorder   °6. Abnormal CT of the abdomen   ° ° °I connected with  Willman D Willette on 01/09/19. °I verified that I was speaking with the correct person using two identifiers. °Due to the COVID-19 Pandemic, this service was provided via telemedicine using audio only media.   °Interactive audio and video telecommunications were attempted between this provider and patient, however failed and thus to provide timely and excellent care, we continued and completed visit with audio only. °The patient was located at home. °The provider was located in the office. °The patient did consent to this visit and is aware of charges through their insurance as well as the limitations of evaluation and management by telemedicine. °Other persons participating in this telemedicine service included the patient's daughter.  °Time spent on visit was 25 minutes in overall coordination of care. ° ° °History of Present Illness: °Please see prior Hospital Consultation Notes & Outpatient Clinic Notes and hospital progress notes for full details of HPI. ° °Interval History °Unfortunately, the patient was  readmitted in April with abdominal pain and he is recurrent pancreatitis issues.  He has remained completely abstinent of any alcohol.  As noted below we pursued an endoscopic ultrasound and found evidence of stone disease in his PD.  There was also concern for possible intraductal lesion.  We held off on second EUS because patient did not feel comfortable with pursuing that for biopsying it due to the risk of pancreatitis as he was slowly improving he wanted time before undergoing any other procedures.  He was discharged feeling well.  Unfortunately, the patient has had 2 ED visits in the course of the last few weeks as a result of nausea/vomiting as well as abdominal pain.  He has had elevations in his lipase noted.  No repeat imaging was performed until imaging was performed 2 days ago prior to our visit here today with findings of persistent chronic pancreatitis as well as PD dilation and evidence of likely small pseudocysts.  Today, the patient is with his daughter.  He is feeling very well.  He is not experiencing any nausea or vomiting.  He is not using any of his antiemetics.  He is not having any abdominal pain.  He is staying away from coffee and doing well.  Continues to be abstinent of alcohol.  Unfortunately still smoking but is trying to cut back.  Denies any issues of early satiety.  Weight has been stable. ° °GI Review of Systems °Positive as above °Negative for dysphagia, odynophagia, change in bowel habits, abdominal pain, early satiety, bloating ° °Review of Systems °General: Denies fever/chills °Cardiovascular: Denies chest pain °Pulmonary: Denies shortness of breath °Gastroenterological: See HPI °Genitourinary: Denies darkened urine °Hematological: Due to anticoagulation he has easy bruising/bleeding °  Dermatological: Denies jaundice °Psychological: Mood is stable ° ° °Medications °Current Outpatient Medications  °Medication Sig Dispense Refill  °• acetaminophen (TYLENOL) 325 MG tablet Take 2  tablets (650 mg total) by mouth every 6 (six) hours.    °• amitriptyline (ELAVIL) 10 MG tablet Take 1 tablet (10 mg total) by mouth at bedtime. 30 tablet 0  °• atorvastatin (LIPITOR) 40 MG tablet Take 0.5 tablets (20 mg total) by mouth daily at 6 PM. 90 tablet 3  °• Blood Glucose Monitoring Suppl (ONE TOUCH ULTRA 2) w/Device KIT Dispense one glucometer. Diagnosis Type 2 Diabetes. E11.9 1 each 0  °• cyanocobalamin 500 MCG tablet Take 500 mcg by mouth daily.    °• docusate sodium (COLACE) 50 MG capsule Take 1 capsule (50 mg total) by mouth 2 (two) times daily. (Patient not taking: Reported on 12/29/2018) 30 capsule 0  °• famotidine (PEPCID) 20 MG tablet Take 1 tablet (20 mg total) by mouth daily. 30 tablet 3  °• feeding supplement, ENSURE ENLIVE, (ENSURE ENLIVE) LIQD Take 237 mLs by mouth 3 (three) times daily between meals. (Patient taking differently: Take 237 mLs by mouth 2 (two) times daily between meals. ) 237 mL 12  °• gabapentin (NEURONTIN) 100 MG capsule TAKE 1 CAPSULE BY MOUTH THREE TIMES A DAY (Patient taking differently: Take 100 mg by mouth 3 (three) times daily. ) 270 capsule 2  °• glucose blood (ONE TOUCH ULTRA TEST) test strip Use check fasting blood glucose qAM. Type 2 Diabetes E11.9 100 each 3  °• HYDROcodone-acetaminophen (NORCO/VICODIN) 5-325 MG tablet Take 1 tablet by mouth every 8 (eight) hours as needed. 6 tablet 0  °• Lancets (FREESTYLE) lancets Use as instructed 100 each 12  °• Lancets (ONETOUCH ULTRASOFT) lancets Use as instructed 100 each 12  °• magnesium oxide (MAG-OX) 400 MG tablet TAKE 1 TABLET BY MOUTH EVERY DAY (Patient taking differently: Take 400 mg by mouth daily. ) 60 tablet 0  °• metFORMIN (GLUCOPHAGE) 1000 MG tablet Take 1 tablet (1,000 mg total) by mouth 2 (two) times daily with a meal. 180 tablet 3  °• metoprolol tartrate (LOPRESSOR) 50 MG tablet Take 1.5 tablets (75 mg total) by mouth 2 (two) times daily. 270 tablet 3  °• mometasone-formoterol (DULERA) 200-5 MCG/ACT AERO Inhale 2  puffs into the lungs 2 (two) times daily. 3 Inhaler 1  °• Multiple Vitamin (MULTIVITAMIN WITH MINERALS) TABS tablet Take 1 tablet by mouth daily.    °• nitroGLYCERIN (NITROSTAT) 0.4 MG SL tablet Place 1 tablet (0.4 mg total) under the tongue every 5 (five) minutes as needed for chest pain. 100 tablet 3  °• ondansetron (ZOFRAN ODT) 4 MG disintegrating tablet Take 1 tablet (4 mg total) by mouth every 8 (eight) hours as needed for nausea or vomiting. 10 tablet 0  °• ondansetron (ZOFRAN) 4 MG tablet Take 1 tablet (4 mg total) by mouth every 8 (eight) hours as needed for nausea or vomiting. (Patient not taking: Reported on 11/18/2018) 30 tablet 0  °• ondansetron (ZOFRAN) 8 MG tablet Take 1 tablet (8 mg total) by mouth every 8 (eight) hours as needed for nausea or vomiting. 30 tablet 0  °• pantoprazole (PROTONIX) 40 MG tablet Take 1 tablet (40 mg total) by mouth daily. 90 tablet 3  °• polyethylene glycol (MIRALAX / GLYCOLAX) 17 g packet TAKE 17 G BY MOUTH DAILY. (Patient taking differently: Take 17 g by mouth daily as needed. ) 14 packet 0  °• prochlorperazine (COMPAZINE) 10 MG tablet Take 1 tablet (10   mg total) by mouth every 8 (eight) hours as needed for nausea or vomiting. 30 tablet 2  °• XARELTO 20 MG TABS tablet TAKE 1 TABLET BY MOUTH DAILY WITH SUPPER (Patient taking differently: Take 20 mg by mouth 2 (two) times a day. ) 90 tablet 1  ° °No current facility-administered medications for this visit.   ° ° °Allergies °Allergies  °Allergen Reactions  °• Prozac [Fluoxetine Hcl] Nausea Only  °  Made the patient very sick to his stomach  °• Morphine And Related Swelling  °  Pt states he can tolerate oxycodone and hydromorphone  °• Penicillins Rash  °  Has patient had a PCN reaction causing immediate rash, facial/tongue/throat swelling, SOB or lightheadedness with hypotension: Yes °Has patient had a PCN reaction causing severe rash involving mucus membranes or skin necrosis: No °Has patient had a PCN reaction that required  hospitalization: No °Has patient had a PCN reaction occurring within the last 10 years: No °If all of the above answers are "NO", then may proceed with Cephalosporin use. °  ° ° °Histories °Past Medical History:  °Diagnosis Date  °• Acute on chronic pancreatitis (HCC) 05/17/2016  °• Alcohol abuse   °• Alcoholic pancreatitis   °• Anginal pain (HCC)   °• Anxiety and depression 01/20/2015  ° PHQ 9 = 11 (01/20/15)    °• Arthritis   ° "eat up w/it" (05/17/2016)  °• Atrial fibrillation status post cardioversion (HCC) 08/2014  °• Atrial flutter, unspecified   °• CHF (congestive heart failure) (HCC)   °• Chronic anticoagulation   °• Chronic atrial fibrillation   °• Chronic bronchitis (HCC)   °• Chronic left-sided low back pain with sciatica 12/21/2012  °• COLD (chronic obstructive lung disease) (HCC)   °• Daily headache   ° "need eye examined" (05/17/2016)  °• Gastric outlet obstruction 10/2018  °• GERD (gastroesophageal reflux disease)   °• Hematuria, microscopic 02/13/2015  ° Noted on UA - Recheck UA at next visit ~ 1 months   °• Hyperlipidemia   °• Hypertension   °• Insomnia 02/11/2016  °• Lateral epicondylitis of right elbow 03/17/2017  °• Left foot pain 10/12/2013  °• Loss of weight 02/19/2018  °• Low back pain 12/21/2012  °• Malnutrition of moderate degree 05/18/2016  °• Neck pain, bilateral posterior 10/15/2014  °• Neuropathy 03/16/2015  °• Pain of joint of left ankle and foot 06/13/2018  °• Pain of right upper extremity 05/30/2016  °• Pancreatic pseudocyst   ° seen on CT scan 07/2014  °• Pancreatitis 08/04/2014  °• Permanent atrial fibrillation 05/05/2017  °• Pneumonia   ° "?a few times" (05/17/2016)  °• 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  °• Tobacco abuse 05/30/2011  ° Heavy smoker up to 3 ppd down to 15 cigs per day in 09/2014   °• Type II diabetes mellitus (HCC)   ° °Past Surgical History:  °Procedure Laterality Date  °• BACK SURGERY    °• BIOPSY  11/20/2018  ° Procedure: BIOPSY;  Surgeon: Mansouraty, Rylynn Schoneman Jr., MD;  Location: [facility];  Service: Gastroenterology;;  °• CARDIOVERSION N/A 09/22/2014  ° Procedure: CARDIOVERSION;  Surgeon: Philip J Nahser, MD;  Location: [facility];  Service: Cardiovascular;  Laterality: N/A;  °• COLONOSCOPY W/ POLYPECTOMY  2019  °• ESOPHAGOGASTRODUODENOSCOPY (EGD) WITH PROPOFOL N/A 11/20/2018  ° Procedure: ESOPHAGOGASTRODUODENOSCOPY (EGD) WITH PROPOFOL;  Surgeon: Mansouraty, Wanna Gully Jr., MD;  Location: [facility];  Service: Gastroenterology;  Laterality: N/A;  °• FOREIGN BODY REMOVAL  11/20/2018  ° Procedure: FOREIGN BODY REMOVAL;    Surgeon: Mansouraty, Huntington Leverich Jr., MD;  Location: [facility];  Service: Gastroenterology;;  °• NECK SURGERY  1974  ° not cervical, states had lesion on neck which was removed  °• POSTERIOR LAMINECTOMY / DECOMPRESSION LUMBAR SPINE  10/2006  ° of L4, L5 and S1 with a 5-1 diskectomy, microdissection with the microscope/notes 10/24/2006  °• SPLENECTOMY  2003  °• UPPER ESOPHAGEAL ENDOSCOPIC ULTRASOUND (EUS) N/A 11/20/2018  ° Procedure: UPPER ESOPHAGEAL ENDOSCOPIC ULTRASOUND (EUS);  Surgeon: Mansouraty, Kylea Berrong Jr., MD;  Location: [facility];  Service: Gastroenterology;  Laterality: N/A;  ° °Social History  ° °Socioeconomic History  °• Marital status: Single  °  Spouse name: Not on file  °• Number of children: 2  °• Years of education: Not on file  °• Highest education level: Not on file  °Occupational History  °• Occupation: retired  °Social Needs  °• Financial resource strain: Not on file  °• Food insecurity  °  Worry: Not on file  °  Inability: Not on file  °• Transportation needs  °  Medical: Not on file  °  Non-medical: Not on file  °Tobacco Use  °• Smoking status: Current Every Day Smoker  °  Packs/day: 1.00  °  Years: 52.00  °  Pack years: 52.00  °  Types: Cigarettes  °  Start date: 04/09/1964  °• Smokeless tobacco: Never Used  °• Tobacco comment: down to one pack daily  °Substance and Sexual Activity  °• Alcohol use: Not Currently  °  Comment: QUIT IN 02/2018  °• Drug use: No    °• Sexual activity: Not Currently  °  Birth control/protection: Condom  °Lifestyle  °• Physical activity  °  Days per week: Not on file  °  Minutes per session: Not on file  °• Stress: Not on file  °Relationships  °• Social connections  °  Talks on phone: Not on file  °  Gets together: Not on file  °  Attends religious service: Not on file  °  Active member of club or organization: Not on file  °  Attends meetings of clubs or organizations: Not on file  °  Relationship status: Not on file  °• Intimate partner violence  °  Fear of current or ex partner: Not on file  °  Emotionally abused: Not on file  °  Physically abused: Not on file  °  Forced sexual activity: Not on file  °Other Topics Concern  °• Not on file  °Social History Narrative  ° 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  ° Single or divorced  ° Admits to alcohol abuse in past- no longer drinking- last drink 02/2018   ° Smoker  ° No drugs  ° °Family History  °Problem Relation Age of Onset  °• Diabetes Mother   °• Aneurysm Mother   °• Stroke Mother   °• Hypertension Mother   °• Alcohol abuse Father   °• Cancer Father   °     Lung  °• Cancer Sister   °     Lung Cancer  °• Post-traumatic stress disorder Brother   °• Heart attack Neg Hx   °• Colon cancer Neg Hx   °• Liver cancer Neg Hx   °• Rectal cancer Neg Hx   °• Esophageal cancer Neg Hx   °• Stomach cancer Neg Hx   °•   Inflammatory bowel disease Neg Hx   ° °I have reviewed his medical, social, and family history in detail and updated the electronic medical record as necessary.  ° ° °PHYSICAL EXAMINATION  °Telehealth visit ° °REVIEW OF DATA  °I reviewed the following data at the time of this encounter: ° °GI Procedures and Studies  °April 2020 EUS °EGD Impression: °- LA Grade B esophagitis. °- A large amount of food (residue) in the stomach. Removal was successful. °- Erosive gastropathy. Non-bleeding gastric  ulcer with a clean ulcer base (Forrest Class III). °Biopsies were taken with a cold forceps for histology and Helicobacter pylori testing. °- Duodenal deformity with dilation. Retained food in the duodenum. Removal was °successful. °- Acquired duodenal stenosis at D1/D2 sweep. Traversed with EGD/Duodenoscope. °- Congested duodenal mucosa at ampullary region. Biopsied. °- No gross lesions in the second portion of the duodenum and in the third portion of the °duodenum. °EUS Impression: °- A cystic lesion was seen in the pancreatic head with associated nodularity within a °component of the pancreatic duct - query mural nodularity. Tissue has not been obtained. °However, the endosonographic appearance is suspicious for dilated PD vs IPMN-MD. °- The pancreatic duct had 2 intraductal lesions that were non-shadowing in the pancreatic °head and genu of the pancreas - query mural nodularity. °- The pancreatic duct had an intraductal lesion that was non-shadowing in the genu of the °pancreas - query mural nodule. °- A lesion identified in the pancreatic neck. °- The pancreatic duct had a dilated endosonographic appearance, had intraductal stones, °had an irregularly contoured endosonographic appearance and had a prominently branched °endosonographic appearance in the pancreatic head, genu of the pancreas, body of the °pancreas and tail of the pancreas. °- Pancreatic parenchymal abnormalities consisting of atrophy, hyperechoic foci, lobularity °with honeycombing and hyperechoic strands were noted in the entire pancreas. °- Hyperechoic material consistent with sludge was visualized endosonographically in the °gallbladder body. °- A few benign lymph nodes were visualized in the peripancreatic region. Tissue has not °been obtained. However, the endosonographic appearance is consistent with benign °inflammatory changes. ° °Laboratory Studies  °Reviewed in epic ° °Imaging Studies  °June 2020 CT abdomen with  contrast °IMPRESSION: °1. Interval progression of the duodenal thickening with progression °of the low-density lesion seen in the anterior wall of the distal °descending duodenum on the prior study. Attenuation of the °intramural duodenal lesion approaches fluid and given the appearance °of it dissecting along the long axis of the duodenum, this is °probably an intramural duodenal pseudocyst. A region of this °collection dissects 180 degrees around the duodenal, compressing the °duodenal lumen between the cystic components. The stomach is not °markedly distended today and oral contrast material has passed °beyond the duodenum, suggesting no overt obstruction, but a °component of significant duodenal stricture is considered likely. °2. Similar appearance of the pseudocyst in the head of pancreas with °associated dystrophic calcification and diffuse dilatation of the °main pancreatic duct. ° ° °ASSESSMENT  °Mr. Lundstrom is a 68 y.o. male with a pmh significant for atrial fibrillation (on Xarelto), diabetes, status post splenectomy, recurrent acute on chronic pancreatitis secondary to alcohol consumption/alcohol abuse (manifested by dilated pancreas duct and pancreatic duct stone disease), COPD, GERD, arthritis, colon polyps (SSPs), chronic tobacco use.  The patient is seen today for evaluation and management of: ° °1. Chronic alcoholic pancreatitis (HCC)   °2. Dilation of pancreatic duct   °3. Pancreatic pseudocyst   °4. Gastroesophageal reflux disease, esophagitis presence not specified   °5. Tobacco use   disorder   °6. Abnormal CT of the abdomen   ° °Overall, the patient seems to be clinically stable and hemodynamically stable today.  However, the patient is now having recurrent episodes of acute on chronic pancreatitis.  He is no longer drinking any alcohol and I think the manifestations of his acute episodes of pancreatitis are likely result of his pancreatic stone disease burden.  On last EUS there was concern for  possible lesion in the pancreatic duct as well I was going to proceed with an EUS for sampling however the patient did not feel comfortable with that.  Is not clear that this is truly an intraductal mucinous neoplasm that is causing all of his pancreatic duct dilation however 1 could query that.  As such I do think considering sampling that area would be reasonable.  We will attempt to perform an EUS to try and evaluate the pancreatic duct.  I would plan to aspirate fluid from the pancreatic duct and send it for CEA analysis and if possible try and actually sample the lesion within the duct.  There is a 1 to 2% chance of a persistent pancreatic duct leak that can occur.  I am also going to attempt to perform an ERCP to see if we can place a stent within the pancreatic duct to try and help the flow issues that may be a result of the stone burden.  It is unclear that I would be able to completely remove the stones based on the location and the size however it would be reasonable to see if we can try and improve drainage and if that would help his recurrent episodes of pancreatitis as well.  I did discuss with the patient and his daughter that it is possible he may need to be seen in 1 of our quaternary centers.  My preference would be to send him to my former colleagues at [facility] to consider other therapies if we have issues.  I will discuss his case with you my former mentors as well.  The patient will need to be off Xarelto for at least 2 to 3 days prior to his procedures.  Although I do not anticipate the patient needing to be admitted it is possible he may need admission after the procedures due to the possibility of developing postprocedural pancreatitis from the EUS and/or the ERCP attempt.  It may be worthwhile for us to have the patient be evaluated by surgery at some point as he may benefit from the consideration of a P scope procedure down the road.  The risks and benefits of endoscopic evaluation were  discussed with the patient; these include but are not limited to the risk of perforation, infection, bleeding, missed lesions, lack of diagnosis, severe illness requiring hospitalization, as well as anesthesia and sedation related illnesses.  The patient is agreeable to proceed.  The risks and benefits of endoscopic evaluation were discussed with the patient; these include but are not limited to the risk of perforation, infection, bleeding, missed lesions, lack of diagnosis, severe illness requiring hospitalization, as well as anesthesia and sedation related illnesses.  The patient is agreeable to proceed.  The risks and benefits of endoscopic evaluation were discussed with the patient; these include but are not limited to the risk of perforation, infection, bleeding, missed lesions, lack of diagnosis, severe illness requiring hospitalization, as well as anesthesia and sedation related illnesses.  The patient is agreeable to proceed.  All patient questions were answered, to the best of   my ability, and the patient agrees to the aforementioned plan of action with follow-up as indicated. ° ° °PLAN  °Send fecal elastase to evaluate for possible role of PERT °Plan to proceed with repeat EUS and attempt to sample area in head of pancreas if remains °Plan to proceed with ERCP attempt for possible PD cannulation and stenting °Continue alcohol abstinence °Try to decrease tobacco use as much as possible °Patient will need to be off anticoagulation for 2 to 3 days prior to procedure °Antiemetics PRN if needed °Surgical consultation for possible PSO in the future will be considered ° ° °Orders Placed This Encounter  °Procedures  °• Pancreatic Elastase, Fecal  °• Ambulatory referral to Gastroenterology  ° ° °New Prescriptions  ° No medications on file  ° °Modified Medications  ° No medications on file  ° ° °Planned Follow Up: °No follow-ups on file. ° ° °Evonna Stoltz Mansouraty, MD °[facility] °Advanced  Endoscopy °Office # [phone] °

## 2019-01-09 NOTE — Telephone Encounter (Signed)
Pt has already been scheduled for ct

## 2019-01-09 NOTE — Progress Notes (Signed)
[place] VISIT   Primary Care Provider [name], DO [place] [address] [phone]  Patient Profile: Timothy Martinez is a 68 y.o. male with a pmh significant for atrial fibrillation (on Xarelto), diabetes, status post splenectomy, recurrent acute on chronic pancreatitis secondary to alcohol consumption/alcohol abuse (manifested by dilated pancreas duct and pancreatic duct stone disease), COPD, GERD, arthritis, colon polyps (SSPs), chronic tobacco use.  The patient presents to the [facility] for an evaluation and management of problem(s) noted below:  Problem List 1. Chronic alcoholic pancreatitis ([place])   2. Dilation of pancreatic duct   3. Pancreatic pseudocyst   4. Gastroesophageal reflux disease, esophagitis presence not specified   5. Tobacco use disorder   6. Abnormal CT of the abdomen     I connected with  Timothy Martinez on 01/09/19. I verified that I was speaking with the correct person using two identifiers. Due to the COVID-19 Pandemic, this service was provided via telemedicine using audio only media.   Interactive audio and video telecommunications were attempted between this provider and patient, however failed and thus to provide timely and excellent care, we continued and completed visit with audio only. The patient was located at home. The provider was located in the office. The patient did consent to this visit and is aware of charges through their insurance as well as the limitations of evaluation and management by telemedicine. Other persons participating in this telemedicine service included the patient's daughter.  Time spent on visit was 25 minutes in overall coordination of care.   History of Present Illness: Please see prior Hospital Consultation Notes & Outpatient Clinic Notes and hospital progress notes for full details of HPI.  Interval History Unfortunately, the patient was  readmitted in April with abdominal pain and he is recurrent pancreatitis issues.  He has remained completely abstinent of any alcohol.  As noted below we pursued an endoscopic ultrasound and found evidence of stone disease in his PD.  There was also concern for possible intraductal lesion.  We held off on second EUS because patient did not feel comfortable with pursuing that for biopsying it due to the risk of pancreatitis as he was slowly improving he wanted time before undergoing any other procedures.  He was discharged feeling well.  Unfortunately, the patient has had 2 ED visits in the course of the last few weeks as a result of nausea/vomiting as well as abdominal pain.  He has had elevations in his lipase noted.  No repeat imaging was performed until imaging was performed 2 days ago prior to our visit here today with findings of persistent chronic pancreatitis as well as PD dilation and evidence of likely small pseudocysts.  Today, the patient is with his daughter.  He is feeling very well.  He is not experiencing any nausea or vomiting.  He is not using any of his antiemetics.  He is not having any abdominal pain.  He is staying away from coffee and doing well.  Continues to be abstinent of alcohol.  Unfortunately still smoking but is trying to cut back.  Denies any issues of early satiety.  Weight has been stable.  GI Review of Systems Positive as above Negative for dysphagia, odynophagia, change in bowel habits, abdominal pain, early satiety, bloating  Review of Systems General: Denies fever/chills Cardiovascular: Denies chest pain Pulmonary: Denies shortness of breath Gastroenterological: See HPI Genitourinary: Denies darkened urine Hematological: Due to anticoagulation he has easy bruising/bleeding  Dermatological: Denies jaundice Psychological: Mood is stable   Medications Current Outpatient Medications  Medication Sig Dispense Refill   acetaminophen (TYLENOL) 325 MG tablet Take 2  tablets (650 mg total) by mouth every 6 (six) hours.     amitriptyline (ELAVIL) 10 MG tablet Take 1 tablet (10 mg total) by mouth at bedtime. 30 tablet 0   atorvastatin (LIPITOR) 40 MG tablet Take 0.5 tablets (20 mg total) by mouth daily at 6 PM. 90 tablet 3   Blood Glucose Monitoring Suppl (ONE TOUCH ULTRA 2) w/Device KIT Dispense one glucometer. Diagnosis Type 2 Diabetes. E11.9 1 each 0   cyanocobalamin 500 MCG tablet Take 500 mcg by mouth daily.     docusate sodium (COLACE) 50 MG capsule Take 1 capsule (50 mg total) by mouth 2 (two) times daily. (Patient not taking: Reported on 12/29/2018) 30 capsule 0   famotidine (PEPCID) 20 MG tablet Take 1 tablet (20 mg total) by mouth daily. 30 tablet 3   feeding supplement, ENSURE ENLIVE, (ENSURE ENLIVE) LIQD Take 237 mLs by mouth 3 (three) times daily between meals. (Patient taking differently: Take 237 mLs by mouth 2 (two) times daily between meals. ) 237 mL 12   gabapentin (NEURONTIN) 100 MG capsule TAKE 1 CAPSULE BY MOUTH THREE TIMES A DAY (Patient taking differently: Take 100 mg by mouth 3 (three) times daily. ) 270 capsule 2   glucose blood (ONE TOUCH ULTRA TEST) test strip Use check fasting blood glucose qAM. Type 2 Diabetes E11.9 100 each 3   HYDROcodone-acetaminophen (NORCO/VICODIN) 5-325 MG tablet Take 1 tablet by mouth every 8 (eight) hours as needed. 6 tablet 0   Lancets (FREESTYLE) lancets Use as instructed 100 each 12   Lancets (ONETOUCH ULTRASOFT) lancets Use as instructed 100 each 12   magnesium oxide (MAG-OX) 400 MG tablet TAKE 1 TABLET BY MOUTH EVERY DAY (Patient taking differently: Take 400 mg by mouth daily. ) 60 tablet 0   metFORMIN (GLUCOPHAGE) 1000 MG tablet Take 1 tablet (1,000 mg total) by mouth 2 (two) times daily with a meal. 180 tablet 3   metoprolol tartrate (LOPRESSOR) 50 MG tablet Take 1.5 tablets (75 mg total) by mouth 2 (two) times daily. 270 tablet 3   mometasone-formoterol (DULERA) 200-5 MCG/ACT AERO Inhale 2  puffs into the lungs 2 (two) times daily. 3 Inhaler 1   Multiple Vitamin (MULTIVITAMIN WITH MINERALS) TABS tablet Take 1 tablet by mouth daily.     nitroGLYCERIN (NITROSTAT) 0.4 MG SL tablet Place 1 tablet (0.4 mg total) under the tongue every 5 (five) minutes as needed for chest pain. 100 tablet 3   ondansetron (ZOFRAN ODT) 4 MG disintegrating tablet Take 1 tablet (4 mg total) by mouth every 8 (eight) hours as needed for nausea or vomiting. 10 tablet 0   ondansetron (ZOFRAN) 4 MG tablet Take 1 tablet (4 mg total) by mouth every 8 (eight) hours as needed for nausea or vomiting. (Patient not taking: Reported on 11/18/2018) 30 tablet 0   ondansetron (ZOFRAN) 8 MG tablet Take 1 tablet (8 mg total) by mouth every 8 (eight) hours as needed for nausea or vomiting. 30 tablet 0   pantoprazole (PROTONIX) 40 MG tablet Take 1 tablet (40 mg total) by mouth daily. 90 tablet 3   polyethylene glycol (MIRALAX / GLYCOLAX) 17 g packet TAKE 17 G BY MOUTH DAILY. (Patient taking differently: Take 17 g by mouth daily as needed. ) 14 packet 0   prochlorperazine (COMPAZINE) 10 MG tablet Take 1 tablet (10  mg total) by mouth every 8 (eight) hours as needed for nausea or vomiting. 30 tablet 2   XARELTO 20 MG TABS tablet TAKE 1 TABLET BY MOUTH DAILY WITH SUPPER (Patient taking differently: Take 20 mg by mouth 2 (two) times a day. ) 90 tablet 1   No current facility-administered medications for this visit.     Allergies Allergies  Allergen Reactions   Prozac [Fluoxetine Hcl] Nausea Only    Made the patient very sick to his stomach   Morphine And Related Swelling    Pt states he can tolerate oxycodone and hydromorphone   Penicillins Rash    Has patient had a PCN reaction causing immediate rash, facial/tongue/throat swelling, SOB or lightheadedness with hypotension: Yes Has patient had a PCN reaction causing severe rash involving mucus membranes or skin necrosis: No Has patient had a PCN reaction that required  hospitalization: No Has patient had a PCN reaction occurring within the last 10 years: No If all of the above answers are "NO", then may proceed with Cephalosporin use.     Histories Past Medical History:  Diagnosis Date   Acute on chronic pancreatitis ([place]) 05/17/2016   Alcohol abuse    Alcoholic pancreatitis    Anginal pain ([place])    Anxiety and depression 01/20/2015   PHQ 9 = 11 (01/20/15)     Arthritis    "eat up w/it" (05/17/2016)   Atrial fibrillation status post cardioversion ([place]) 08/2014   Atrial flutter, unspecified    CHF (congestive heart failure) (H[facility])    Chronic anticoagulation    Chronic atrial fibrillation    Chronic bronchitis ([facility])    Chronic left-sided low back pain with sciatica 12/21/2012   COLD (chronic obstructive lung disease) ([facility])    Daily headache    "need eye examined" (05/17/2016)   Gastric outlet obstruction 10/2018   GERD (gastroesophageal reflux disease)    Hematuria, microscopic 02/13/2015   Noted on UA - Recheck UA at next visit ~ 1 months    Hyperlipidemia    Hypertension    Insomnia 02/11/2016   Lateral epicondylitis of right elbow 03/17/2017   Left foot pain 10/12/2013   Loss of weight 02/19/2018   Low back pain 12/21/2012   Malnutrition of moderate degree 05/18/2016   Neck pain, bilateral posterior 10/15/2014   Neuropathy 03/16/2015   Pain of joint of left ankle and foot 06/13/2018   Pain of right upper extremity 05/30/2016   Pancreatic pseudocyst    seen on CT scan 07/2014   Pancreatitis 08/04/2014   Permanent atrial fibrillation 05/05/2017   Pneumonia    "?a few times" (05/17/2016)   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   Tobacco abuse 05/30/2011   Heavy smoker up to 3 ppd down to 15 cigs per day in 09/2014    Type II diabetes mellitus [facility])    Past Surgical History:  Procedure Laterality Date   BACK SURGERY     BIOPSY  11/20/2018   Procedure: BIOPSY;  Surgeon: Irving Copas., MD;  Location: [place];  Service: Gastroenterology;;   CARDIOVERSION N/A 09/22/2014   Procedure: CARDIOVERSION;  Surgeon: Thayer Headings, MD;  Location: [facility] ENDOSCOPY;  Service: Cardiovascular;  Laterality: N/A;   COLONOSCOPY W/ POLYPECTOMY  2019   ESOPHAGOGASTRODUODENOSCOPY (EGD) WITH PROPOFOL N/A 11/20/2018   Procedure: ESOPHAGOGASTRODUODENOSCOPY (EGD) WITH PROPOFOL;  Surgeon: Irving Copas., MD;  Location: [facility] ENDOSCOPY;  Service: Gastroenterology;  Laterality: N/A;   FOREIGN BODY REMOVAL  11/20/2018   Procedure: FOREIGN BODY REMOVAL;  Surgeon: Irving Copas., MD;  Location: [place];  Service: Gastroenterology;;   [place]   not cervical, states had lesion on neck which was removed   POSTERIOR LAMINECTOMY / [place]  10/2006   of L4, L5 and S1 with a 5-1 diskectomy, microdissection with the microscope/notes 10/24/2006   SPLENECTOMY  2003   UPPER ESOPHAGEAL ENDOSCOPIC ULTRASOUND (EUS) N/A 11/20/2018   Procedure: UPPER ESOPHAGEAL ENDOSCOPIC ULTRASOUND (EUS);  Surgeon: Irving Copas., MD;  Location: [place];  Service: Gastroenterology;  Laterality: N/A;   Social History   Socioeconomic History   Marital status: Single    Spouse name: Not on file   Number of children: 2   Years of education: Not on file   Highest education level: Not on file  Occupational History   Occupation: retired  Scientist, product/process development strain: Not on file   Food insecurity    Worry: Not on file    Inability: Not on Lexicographer needs    Medical: Not on file    Non-medical: Not on file  Tobacco Use   Smoking status: Current Every Day Smoker    Packs/day: 1.00    Years: 52.00    Pack years: 52.00    Types: Cigarettes    Start date: 04/09/1964   Smokeless tobacco: Never Used   Tobacco comment: down to one pack daily  Substance and Sexual Activity   Alcohol use: Not Currently    Comment: QUIT IN 02/2018   Drug use: No     Sexual activity: Not Currently    Birth control/protection: Condom  Lifestyle   Physical activity    Days per week: Not on file    Minutes per session: Not on file   Stress: Not on file  Relationships   Social connections    Talks on phone: Not on file    Gets together: Not on file    Attends religious service: Not on file    Active member of club or organization: Not on file    Attends meetings of clubs or organizations: Not on file    Relationship status: Not on file   Intimate partner violence    Fear of current or ex partner: Not on file    Emotionally abused: Not on file    Physically abused: Not on file    Forced sexual activity: Not on file  Other Topics Concern   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     2 children one is Radio producer in transplant center   Single or divorced   Admits to alcohol abuse in past- no longer drinking- last drink 02/2018    Smoker   No drugs   Family History  Problem Relation Age of Onset   Diabetes Mother    Aneurysm Mother    Stroke Mother    Hypertension Mother    Alcohol abuse Father    Cancer Father        Lung   Cancer Sister        Lung Cancer   Post-traumatic stress disorder Brother    Heart attack Neg Hx    Colon cancer Neg Hx    Liver cancer Neg Hx    Rectal cancer Neg Hx    Esophageal cancer Neg Hx    Stomach cancer Neg Hx  Inflammatory bowel disease Neg Hx    I have reviewed his medical, social, and family history in detail and updated the electronic medical record as necessary.    PHYSICAL EXAMINATION  Telehealth visit  REVIEW OF DATA  I reviewed the following data at the time of this encounter:  GI Procedures and Studies  April 2020 EUS EGD Impression: - LA Grade B esophagitis. - A large amount of food (residue) in the stomach. Removal was successful. - Erosive gastropathy. Non-bleeding gastric  ulcer with a clean ulcer base ([place] Class III). Biopsies were taken with a cold forceps for histology and Helicobacter pylori testing. - Duodenal deformity with dilation. Retained food in the duodenum. Removal was successful. - Acquired duodenal stenosis at D1/D2 sweep. Traversed with EGD/Duodenoscope. - Congested duodenal mucosa at ampullary region. Biopsied. - No gross lesions in the second portion of the duodenum and in the third portion of the duodenum. EUS Impression: - A cystic lesion was seen in the pancreatic head with associated nodularity within a component of the pancreatic duct - query mural nodularity. Tissue has not been obtained. However, the endosonographic appearance is suspicious for dilated PD vs IPMN-MD. - The pancreatic duct had 2 intraductal lesions that were non-shadowing in the pancreatic head and genu of the pancreas - query mural nodularity. - The pancreatic duct had an intraductal lesion that was non-shadowing in the genu of the pancreas - query mural nodule. - A lesion identified in the pancreatic neck. - The pancreatic duct had a dilated endosonographic appearance, had intraductal stones, had an irregularly contoured endosonographic appearance and had a prominently branched endosonographic appearance in the pancreatic head, genu of the pancreas, body of the pancreas and tail of the pancreas. - Pancreatic parenchymal abnormalities consisting of atrophy, hyperechoic foci, lobularity with honeycombing and hyperechoic strands were noted in the entire pancreas. - Hyperechoic material consistent with sludge was visualized endosonographically in the gallbladder body. - A few benign lymph nodes were visualized in the peripancreatic region. Tissue has not been obtained. However, the endosonographic appearance is consistent with benign inflammatory changes.  Laboratory Studies  Reviewed in epic  Imaging Studies  June 2020 CT abdomen with  contrast IMPRESSION: 1. Interval progression of the duodenal thickening with progression of the low-density lesion seen in the anterior wall of the distal descending duodenum on the prior study. Attenuation of the intramural duodenal lesion approaches fluid and given the appearance of it dissecting along the long axis of the duodenum, this is probably an intramural duodenal pseudocyst. A region of this collection dissects 180 degrees around the duodenal, compressing the duodenal lumen between the cystic components. The stomach is not markedly distended today and oral contrast material has passed beyond the duodenum, suggesting no overt obstruction, but a component of significant duodenal stricture is considered likely. 2. Similar appearance of the pseudocyst in the head of pancreas with associated dystrophic calcification and diffuse dilatation of the main pancreatic duct.   ASSESSMENT  Mr. Jenny is a 67 y.o. male with a pmh significant for atrial fibrillation (on Xarelto), diabetes, status post splenectomy, recurrent acute on chronic pancreatitis secondary to alcohol consumption/alcohol abuse (manifested by dilated pancreas duct and pancreatic duct stone disease), COPD, GERD, arthritis, colon polyps (SSPs), chronic tobacco use.  The patient is seen today for evaluation and management of:  1. Chronic alcoholic pancreatitis ([place])   2. Dilation of pancreatic duct   3. Pancreatic pseudocyst   4. Gastroesophageal reflux disease, esophagitis presence not specified   5. Tobacco use  disorder   6. Abnormal CT of the abdomen    Overall, the patient seems to be clinically stable and hemodynamically stable today.  However, the patient is now having recurrent episodes of acute on chronic pancreatitis.  He is no longer drinking any alcohol and I think the manifestations of his acute episodes of pancreatitis are likely result of his pancreatic stone disease burden.  On last EUS there was concern for  possible lesion in the pancreatic duct as well I was going to proceed with an EUS for sampling however the patient did not feel comfortable with that.  Is not clear that this is truly an intraductal mucinous neoplasm that is causing all of his pancreatic duct dilation however 1 could query that.  As such I do think considering sampling that area would be reasonable.  We will attempt to perform an EUS to try and evaluate the pancreatic duct.  I would plan to aspirate fluid from the pancreatic duct and send it for CEA analysis and if possible try and actually sample the lesion within the duct.  There is a 1 to 2% chance of a persistent pancreatic duct leak that can occur.  I am also going to attempt to perform an ERCP to see if we can place a stent within the pancreatic duct to try and help the flow issues that may be a result of the stone burden.  It is unclear that I would be able to completely remove the stones based on the location and the size however it would be reasonable to see if we can try and improve drainage and if that would help his recurrent episodes of pancreatitis as well.  I did discuss with the patient and his daughter that it is possible he may need to be seen in 1 of our quaternary centers.  My preference would be to send him to my former colleagues at [facility] to consider other therapies if we have issues.  I will discuss his case with you my former mentors as well.  The patient will need to be off Xarelto for at least 2 to 3 days prior to his procedures.  Although I do not anticipate the patient needing to be admitted it is possible he may need admission after the procedures due to the possibility of developing postprocedural pancreatitis from the EUS and/or the ERCP attempt.  It may be worthwhile for [place] to have the patient be evaluated by surgery at some point as he may benefit from the consideration of a P scope procedure down the road.  The risks and benefits of endoscopic evaluation were  discussed with the patient; these include but are not limited to the risk of perforation, infection, bleeding, missed lesions, lack of diagnosis, severe illness requiring hospitalization, as well as anesthesia and sedation related illnesses.  The patient is agreeable to proceed.  The risks and benefits of endoscopic evaluation were discussed with the patient; these include but are not limited to the risk of perforation, infection, bleeding, missed lesions, lack of diagnosis, severe illness requiring hospitalization, as well as anesthesia and sedation related illnesses.  The patient is agreeable to proceed.  The risks and benefits of endoscopic evaluation were discussed with the patient; these include but are not limited to the risk of perforation, infection, bleeding, missed lesions, lack of diagnosis, severe illness requiring hospitalization, as well as anesthesia and sedation related illnesses.  The patient is agreeable to proceed.  All patient questions were answered, to the best of  my ability, and the patient agrees to the aforementioned plan of action with follow-up as indicated.   PLAN  Send fecal elastase to evaluate for possible role of PERT Plan to proceed with repeat EUS and attempt to sample area in head of pancreas if remains Plan to proceed with ERCP attempt for possible PD cannulation and stenting Continue alcohol abstinence Try to decrease tobacco use as much as possible Patient will need to be off anticoagulation for 2 to 3 days prior to procedure Antiemetics PRN if needed Surgical consultation for possible PSO in the future will be considered   Orders Placed This Encounter  Procedures   Pancreatic Elastase, Fecal   Ambulatory referral to Gastroenterology    New Prescriptions   No medications on file   Modified Medications   No medications on file    Planned Follow Up: No follow-ups on file.   Justice Britain, MD [facility]  Endoscopy Office # [phone]

## 2019-01-09 NOTE — Patient Instructions (Addendum)
Your provider has requested that you go to the basement level for lab work before leaving today. Press "B" on the elevator. The lab is located at the first door on the left as you exit the elevator.  Stop pepcid.   You have been scheduled for an EUS+ERCP. Please follow written instructions given to you at your visit today. If you use inhalers (even only as needed), please bring them with you on the day of your procedure. Your physician has requested that you go to www.[email or web address].com and enter the access code given to you at your visit today. This web site gives a general overview about your procedure. However, you should still follow specific instructions given to you by our office regarding your preparation for the procedure.   Thank you for choosing me and [facility].  Dr. Rush Landmark

## 2019-01-10 ENCOUNTER — Telehealth: Payer: Self-pay

## 2019-01-10 NOTE — Telephone Encounter (Signed)
Request for surgical clearance:     Endoscopy Procedure  What type of surgery is being performed?  EUS+ ERCP   When is this surgery scheduled?   02/04/2019  What type of clearance is required ?   Pharmacy  Are there any medications that need to be held prior to surgery and how long? Hold Xarelto 2 days prior to procedure.  Practice name and name of physician performing surgery?      [facility]  What is your office phone and fax number?      Phone- [phone]  Fax- [phone] Attn: Helmut Muster   Anesthesia type (None, local, MAC, general) ?       MAC

## 2019-01-11 NOTE — Telephone Encounter (Signed)
Patient with diagnosis of afib on Xarelto for anticoagulation.    Procedure: EUS+ERCP Date of procedure: 02/04/2019  CHADS2-VASc score of  3 (CHF, HTN, AGE, DM2, stroke/tia x 2, CAD, AGE, male)  CrCl 44ml/min  Per office protocol, patient can hold Xarelto for 2 days prior to procedure.

## 2019-01-11 NOTE — Telephone Encounter (Signed)
Request for surgical clearance:     Endoscopy Procedure  What type of surgery is being performed?     EUS+ERCP  When is this surgery scheduled?     02/04/2019  What type of clearance is required ?   Pharmacy  Are there any medications that need to be held prior to surgery and how long? Xarelto x2 days prior to procedure  Practice name and name of physician performing surgery?      [facility]  What is your office phone and fax number?      Phone- [phone]  Fax- [phone] Attn: Helmut Muster   Anesthesia type (None, local, MAC, general) ?       MAC

## 2019-01-14 NOTE — Telephone Encounter (Signed)
Thank you. GM 

## 2019-01-14 NOTE — Telephone Encounter (Signed)
   Primary Cardiologist: Mertie Moores, MD  Chart reviewed as part of pre-operative protocol coverage. Patient was contacted 01/14/2019 in reference to pre-operative risk assessment for pending surgery as outlined below.  Timothy Martinez was last seen on 05/30/2018 by Dr. Acie Fredrickson.  Since that day, Timothy Martinez has done well with no new cardiac complaints.  Therefore, based on ACC/AHA guidelines, the patient would be at acceptable risk for the planned procedure without further cardiovascular testing.         Per our pharmacy protocol: Patient with diagnosis of afib on Xarelto for anticoagulation.    Procedure: EUS+ERCP Date of procedure: 02/04/2019  CHADS2-VASc score of  3 (CHF, HTN, AGE, DM2, stroke/tia x 2, CAD, AGE, male)  CrCl 24ml/min  Per office protocol, patient can hold Xarelto for 2 days prior to procedure.          I will route this recommendation to the requesting party via Epic fax function and remove from pre-op pool.  Please call with questions.  Daune Perch, NP 01/14/2019, 9:06 AM

## 2019-01-17 ENCOUNTER — Other Ambulatory Visit: Payer: Medicare Other

## 2019-01-17 ENCOUNTER — Other Ambulatory Visit: Payer: Self-pay

## 2019-01-17 DIAGNOSIS — R109 Unspecified abdominal pain: Secondary | ICD-10-CM | POA: Diagnosis not present

## 2019-01-17 DIAGNOSIS — K219 Gastro-esophageal reflux disease without esophagitis: Secondary | ICD-10-CM

## 2019-01-17 DIAGNOSIS — K863 Pseudocyst of pancreas: Secondary | ICD-10-CM

## 2019-01-17 DIAGNOSIS — K8689 Other specified diseases of pancreas: Secondary | ICD-10-CM

## 2019-01-17 DIAGNOSIS — K860 Alcohol-induced chronic pancreatitis: Secondary | ICD-10-CM

## 2019-01-17 DIAGNOSIS — D509 Iron deficiency anemia, unspecified: Secondary | ICD-10-CM

## 2019-01-23 ENCOUNTER — Other Ambulatory Visit: Payer: Self-pay | Admitting: Family Medicine

## 2019-01-23 LAB — PANCREATIC ELASTASE, FECAL: Pancreatic Elastase-1, Stool: 17 mcg/g — ABNORMAL LOW

## 2019-01-24 DIAGNOSIS — K8681 Exocrine pancreatic insufficiency: Secondary | ICD-10-CM | POA: Insufficient documentation

## 2019-01-28 ENCOUNTER — Telehealth: Payer: Self-pay | Admitting: Gastroenterology

## 2019-01-28 ENCOUNTER — Telehealth: Payer: Self-pay | Admitting: *Deleted

## 2019-01-28 ENCOUNTER — Other Ambulatory Visit: Payer: Self-pay

## 2019-01-28 MED ORDER — PANCRELIPASE (LIP-PROT-AMYL) 36000-114000 UNITS PO CPEP: ORAL_CAPSULE | ORAL | 3 refills | Status: DC

## 2019-01-28 NOTE — Telephone Encounter (Signed)
Oxycodone request from pharmacy.  Not on Current med list. Will forward to PCP.  Christen Bame, CMA

## 2019-01-28 NOTE — Telephone Encounter (Signed)
Pt was speaking with his PCP office regarding this. We have already sent RX for Creon.

## 2019-01-28 NOTE — Telephone Encounter (Signed)
Patient called in and wanted to speak with the nurse. He stated he talked with the nurse and she was to send some stronger medication in  But when he went to pharmacy nothing was there. He was not sure of the name of the medication that was supposed to be called in.

## 2019-01-31 ENCOUNTER — Other Ambulatory Visit: Payer: Self-pay | Admitting: Family Medicine

## 2019-01-31 ENCOUNTER — Other Ambulatory Visit (HOSPITAL_COMMUNITY)
Admission: RE | Admit: 2019-01-31 | Discharge: 2019-01-31 | Disposition: A | Discharge status: Home / Self Care | Payer: Medicare Other | Source: Ambulatory Visit | Attending: Gastroenterology | Admitting: Gastroenterology

## 2019-01-31 DIAGNOSIS — Z1159 Encounter for screening for other viral diseases: Secondary | ICD-10-CM | POA: Diagnosis not present

## 2019-01-31 DIAGNOSIS — Z01812 Encounter for preprocedural laboratory examination: Secondary | ICD-10-CM | POA: Insufficient documentation

## 2019-01-31 MED ORDER — OXYCODONE-ACETAMINOPHEN 10-325 MG PO TABS: 1.0000 | ORAL_TABLET | ORAL | 0 refills | Status: AC | PRN

## 2019-01-31 NOTE — Telephone Encounter (Signed)
Informed patient of RX.  Timothy Martinez, [place]

## 2019-01-31 NOTE — Progress Notes (Signed)
Patient with chronic pancreatitis pain. Sending in percocet 10 to hopefully prevent hospitalization and control short term pain.

## 2019-02-01 ENCOUNTER — Encounter (HOSPITAL_COMMUNITY): Payer: Self-pay | Admitting: *Deleted

## 2019-02-01 ENCOUNTER — Other Ambulatory Visit: Payer: Self-pay

## 2019-02-01 LAB — SARS CORONAVIRUS 2 (TAT 6-24 HRS): SARS Coronavirus 2: NEGATIVE

## 2019-02-01 NOTE — Progress Notes (Signed)
Pt denies SOB and chest pain. Pt stated that he is under the care of Dr. Acie Fredrickson, Cardiology. Pt stated that a stress test was performed > 10 years ago but denies having a cardiac cath. Pt stated that he is " not sure"  if he had a follow up chest x ray as recommended with last chest x ray report. Pt made aware to stop taking  vitamins, fish oil and herbal medications. Do not take any NSAIDs ie: Ibuprofen, Advil, Naproxen (Aleve), Motrin, BC and Goody Powder. Pt stated that his last dose of Xarelto was Thursday as instructed by MD. Pt made aware to not take Metformin on DOS. Pt made aware to check BG every 2 hours prior to arrival to hospital on DOS. Pt made aware to treat a BG < 70 with  4 ounces of apple  juice, wait 15 minutes after intervention to recheck BG, if BG remains < 70, call Short Stay unit to speak with a nurse. Pt denies that he and family members tested positive for COVID-19 ( pt tested on 01/31/19 and reminded to quarantine ).   Coronavirus Screening  Pt denies that he and family members experienced the following symptoms:  Cough yes/no: No Fever (>100.81F)  yes/no: No Runny nose yes/no: No Sore throat yes/no: No Difficulty breathing/shortness of breath  yes/no: No  Have you or a family member traveled in the last 14 days and where? yes/no: No  Pt reminded that hospital visitation restrictions are in effect and the importance of the restrictions.   Pt verbalized understanding of all pre-op instructions.

## 2019-02-04 ENCOUNTER — Ambulatory Visit (HOSPITAL_COMMUNITY)
Admission: RE | Admit: 2019-02-04 | Discharge: 2019-02-04 | Disposition: A | Discharge status: Home / Self Care | Payer: Medicare Other | Attending: Gastroenterology | Admitting: Gastroenterology

## 2019-02-04 ENCOUNTER — Encounter (HOSPITAL_COMMUNITY)
Admission: RE | Disposition: A | Discharge status: Home / Self Care | Payer: Self-pay | Source: Home / Self Care | Attending: Gastroenterology

## 2019-02-04 ENCOUNTER — Encounter (HOSPITAL_COMMUNITY): Payer: Self-pay | Admitting: Gastroenterology

## 2019-02-04 ENCOUNTER — Ambulatory Visit (HOSPITAL_COMMUNITY): Payer: Medicare Other | Admitting: Certified Registered Nurse Anesthetist

## 2019-02-04 ENCOUNTER — Ambulatory Visit (HOSPITAL_COMMUNITY): Payer: Medicare Other

## 2019-02-04 ENCOUNTER — Other Ambulatory Visit: Payer: Self-pay

## 2019-02-04 DIAGNOSIS — K861 Other chronic pancreatitis: Secondary | ICD-10-CM | POA: Diagnosis not present

## 2019-02-04 DIAGNOSIS — K860 Alcohol-induced chronic pancreatitis: Secondary | ICD-10-CM | POA: Insufficient documentation

## 2019-02-04 DIAGNOSIS — R590 Localized enlarged lymph nodes: Secondary | ICD-10-CM | POA: Diagnosis not present

## 2019-02-04 DIAGNOSIS — F418 Other specified anxiety disorders: Secondary | ICD-10-CM | POA: Diagnosis not present

## 2019-02-04 DIAGNOSIS — I4892 Unspecified atrial flutter: Secondary | ICD-10-CM | POA: Insufficient documentation

## 2019-02-04 DIAGNOSIS — E1140 Type 2 diabetes mellitus with diabetic neuropathy, unspecified: Secondary | ICD-10-CM | POA: Diagnosis not present

## 2019-02-04 DIAGNOSIS — M1990 Unspecified osteoarthritis, unspecified site: Secondary | ICD-10-CM | POA: Insufficient documentation

## 2019-02-04 DIAGNOSIS — J449 Chronic obstructive pulmonary disease, unspecified: Secondary | ICD-10-CM | POA: Insufficient documentation

## 2019-02-04 DIAGNOSIS — Z539 Procedure and treatment not carried out, unspecified reason: Secondary | ICD-10-CM | POA: Diagnosis not present

## 2019-02-04 DIAGNOSIS — K219 Gastro-esophageal reflux disease without esophagitis: Secondary | ICD-10-CM | POA: Diagnosis not present

## 2019-02-04 DIAGNOSIS — K863 Pseudocyst of pancreas: Secondary | ICD-10-CM | POA: Diagnosis not present

## 2019-02-04 DIAGNOSIS — I110 Hypertensive heart disease with heart failure: Secondary | ICD-10-CM | POA: Diagnosis not present

## 2019-02-04 DIAGNOSIS — K859 Acute pancreatitis without necrosis or infection, unspecified: Secondary | ICD-10-CM

## 2019-02-04 DIAGNOSIS — I4821 Permanent atrial fibrillation: Secondary | ICD-10-CM | POA: Diagnosis not present

## 2019-02-04 DIAGNOSIS — I509 Heart failure, unspecified: Secondary | ICD-10-CM | POA: Diagnosis not present

## 2019-02-04 DIAGNOSIS — F1010 Alcohol abuse, uncomplicated: Secondary | ICD-10-CM | POA: Diagnosis not present

## 2019-02-04 DIAGNOSIS — F17210 Nicotine dependence, cigarettes, uncomplicated: Secondary | ICD-10-CM | POA: Insufficient documentation

## 2019-02-04 DIAGNOSIS — R933 Abnormal findings on diagnostic imaging of other parts of digestive tract: Secondary | ICD-10-CM | POA: Diagnosis not present

## 2019-02-04 DIAGNOSIS — K8689 Other specified diseases of pancreas: Secondary | ICD-10-CM | POA: Insufficient documentation

## 2019-02-04 DIAGNOSIS — K838 Other specified diseases of biliary tract: Secondary | ICD-10-CM | POA: Insufficient documentation

## 2019-02-04 DIAGNOSIS — Z888 Allergy status to other drugs, medicaments and biological substances status: Secondary | ICD-10-CM | POA: Insufficient documentation

## 2019-02-04 DIAGNOSIS — Z885 Allergy status to narcotic agent status: Secondary | ICD-10-CM | POA: Insufficient documentation

## 2019-02-04 DIAGNOSIS — R935 Abnormal findings on diagnostic imaging of other abdominal regions, including retroperitoneum: Secondary | ICD-10-CM | POA: Diagnosis present

## 2019-02-04 DIAGNOSIS — K3189 Other diseases of stomach and duodenum: Secondary | ICD-10-CM

## 2019-02-04 DIAGNOSIS — Z880 Allergy status to penicillin: Secondary | ICD-10-CM | POA: Insufficient documentation

## 2019-02-04 DIAGNOSIS — K862 Cyst of pancreas: Secondary | ICD-10-CM | POA: Diagnosis not present

## 2019-02-04 DIAGNOSIS — Z79899 Other long term (current) drug therapy: Secondary | ICD-10-CM | POA: Insufficient documentation

## 2019-02-04 DIAGNOSIS — Z7951 Long term (current) use of inhaled steroids: Secondary | ICD-10-CM | POA: Diagnosis not present

## 2019-02-04 DIAGNOSIS — E785 Hyperlipidemia, unspecified: Secondary | ICD-10-CM | POA: Insufficient documentation

## 2019-02-04 DIAGNOSIS — Z7984 Long term (current) use of oral hypoglycemic drugs: Secondary | ICD-10-CM | POA: Insufficient documentation

## 2019-02-04 DIAGNOSIS — Z7901 Long term (current) use of anticoagulants: Secondary | ICD-10-CM | POA: Insufficient documentation

## 2019-02-04 HISTORY — DX: Presence of dental prosthetic device (complete) (partial): Z97.2

## 2019-02-04 HISTORY — DX: Other complications of anesthesia, initial encounter: T88.59XA

## 2019-02-04 HISTORY — DX: Presence of spectacles and contact lenses: Z97.3

## 2019-02-04 HISTORY — PX: ESOPHAGOGASTRODUODENOSCOPY (EGD) WITH PROPOFOL: SHX5813

## 2019-02-04 HISTORY — PX: UPPER ESOPHAGEAL ENDOSCOPIC ULTRASOUND (EUS): SHX6562

## 2019-02-04 HISTORY — PX: ERCP: SHX5425

## 2019-02-04 HISTORY — PX: BIOPSY: SHX5522

## 2019-02-04 HISTORY — DX: Other chronic pancreatitis: K86.1

## 2019-02-04 LAB — GLUCOSE, CAPILLARY: Glucose-Capillary: 119 mg/dL — ABNORMAL HIGH (ref 70–99)

## 2019-02-04 SURGERY — UPPER ESOPHAGEAL ENDOSCOPIC ULTRASOUND (EUS)
Anesthesia: General

## 2019-02-04 MED ORDER — CIPROFLOXACIN IN D5W 400 MG/200ML IV SOLN
INTRAVENOUS | Status: AC
  Filled 2019-02-04: qty 200

## 2019-02-04 MED ORDER — LACTATED RINGERS IV SOLN
INTRAVENOUS | Status: AC | PRN
  Administered 2019-02-04: 1000 mL via INTRAVENOUS
  Administered 2019-02-04: 11:00:00 via INTRAVENOUS

## 2019-02-04 MED ORDER — FENTANYL CITRATE (PF) 100 MCG/2ML IJ SOLN
INTRAMUSCULAR | Status: DC | PRN
  Administered 2019-02-04: 100 ug via INTRAVENOUS
  Administered 2019-02-04: 50 ug via INTRAVENOUS

## 2019-02-04 MED ORDER — EPHEDRINE SULFATE-NACL 50-0.9 MG/10ML-% IV SOSY
PREFILLED_SYRINGE | INTRAVENOUS | Status: DC | PRN
  Administered 2019-02-04 (×2): 10 mg via INTRAVENOUS

## 2019-02-04 MED ORDER — SODIUM CHLORIDE 0.9 % IV SOLN: INTRAVENOUS | Status: DC

## 2019-02-04 MED ORDER — INDOMETHACIN 50 MG RE SUPP
RECTAL | Status: DC | PRN
  Administered 2019-02-04: 100 mg via RECTAL

## 2019-02-04 MED ORDER — ONDANSETRON HCL 4 MG/2ML IJ SOLN
INTRAMUSCULAR | Status: DC | PRN
  Administered 2019-02-04: 4 mg via INTRAVENOUS

## 2019-02-04 MED ORDER — INDOMETHACIN 50 MG RE SUPP
RECTAL | Status: AC
  Filled 2019-02-04: qty 2

## 2019-02-04 MED ORDER — PROPOFOL 10 MG/ML IV BOLUS
INTRAVENOUS | Status: DC | PRN
  Administered 2019-02-04: 150 mg via INTRAVENOUS

## 2019-02-04 MED ORDER — CIPROFLOXACIN IN D5W 400 MG/200ML IV SOLN
400.0000 mg | Freq: Once | INTRAVENOUS | Status: AC
  Administered 2019-02-04: 09:00:00 400 mg via INTRAVENOUS

## 2019-02-04 MED ORDER — LIDOCAINE 2% (20 MG/ML) 5 ML SYRINGE
INTRAMUSCULAR | Status: DC | PRN
  Administered 2019-02-04: 100 mg via INTRAVENOUS

## 2019-02-04 MED ORDER — MIDAZOLAM HCL 5 MG/5ML IJ SOLN
INTRAMUSCULAR | Status: DC | PRN
  Administered 2019-02-04: 2 mg via INTRAVENOUS

## 2019-02-04 MED ORDER — DEXAMETHASONE SODIUM PHOSPHATE 10 MG/ML IJ SOLN
INTRAMUSCULAR | Status: DC | PRN
  Administered 2019-02-04: 5 mg via INTRAVENOUS

## 2019-02-04 MED ORDER — ROCURONIUM BROMIDE 100 MG/10ML IV SOLN
INTRAVENOUS | Status: DC | PRN
  Administered 2019-02-04: 20 mg via INTRAVENOUS
  Administered 2019-02-04: 40 mg via INTRAVENOUS
  Administered 2019-02-04: 10 mg via INTRAVENOUS

## 2019-02-04 MED ORDER — GLUCAGON HCL RDNA (DIAGNOSTIC) 1 MG IJ SOLR
INTRAMUSCULAR | Status: AC
  Filled 2019-02-04: qty 1

## 2019-02-04 MED ORDER — GLUCAGON HCL RDNA (DIAGNOSTIC) 1 MG IJ SOLR
INTRAMUSCULAR | Status: DC | PRN
  Administered 2019-02-04 (×3): 0.25 mg via INTRAVENOUS

## 2019-02-04 MED ORDER — SUCCINYLCHOLINE CHLORIDE 200 MG/10ML IV SOSY
PREFILLED_SYRINGE | INTRAVENOUS | Status: DC | PRN
  Administered 2019-02-04: 80 mg via INTRAVENOUS

## 2019-02-04 MED ORDER — SUGAMMADEX SODIUM 200 MG/2ML IV SOLN
INTRAVENOUS | Status: DC | PRN
  Administered 2019-02-04: 175 mg via INTRAVENOUS

## 2019-02-04 NOTE — Anesthesia Preprocedure Evaluation (Signed)
Anesthesia Evaluation  Patient identified by MRN, date of birth, ID band Patient awake    Reviewed: Allergy & Precautions, NPO status , Patient's Chart, lab work & pertinent test results  History of Anesthesia Complications Negative for: history of anesthetic complications  Airway Mallampati: I  TM Distance: >3 FB Neck ROM: Full    Dental  (+) Edentulous Upper, Edentulous Lower   Pulmonary COPD,  COPD inhaler, Current Smoker,    Pulmonary exam normal        Cardiovascular hypertension, +CHF (EF now recovered)  Normal cardiovascular exam+ dysrhythmias Atrial Fibrillation      Neuro/Psych PSYCHIATRIC DISORDERS Anxiety Depression negative neurological ROS     GI/Hepatic PUD, GERD  Medicated,(+)     substance abuse  alcohol use,   Endo/Other  diabetes, Type 2, Oral Hypoglycemic Agents  Renal/GU negative Renal ROS  negative genitourinary   Musculoskeletal negative musculoskeletal ROS (+)   Abdominal   Peds  Hematology negative hematology ROS (+)   Anesthesia Other Findings   Reproductive/Obstetrics                           Anesthesia Physical Anesthesia Plan  ASA: III  Anesthesia Plan: General   Post-op Pain Management:    Induction: Intravenous and Rapid sequence  PONV Risk Score and Plan: 1 and Ondansetron, Dexamethasone and Treatment may vary due to age or medical condition  Airway Management Planned: Oral ETT  Additional Equipment: None  Intra-op Plan:   Post-operative Plan: Extubation in OR  Informed Consent: I have reviewed the patients History and Physical, chart, labs and discussed the procedure including the risks, benefits and alternatives for the proposed anesthesia with the patient or authorized representative who has indicated his/her understanding and acceptance.     Dental advisory given  Plan Discussed with:   Anesthesia Plan Comments:         Anesthesia Quick Evaluation

## 2019-02-04 NOTE — Op Note (Signed)
[facility] Patient Name: Timothy Martinez Procedure Date : 02/04/2019 MRN: [id number] Attending MD: Justice Britain , MD Date of Birth: [birth year] CSN: [id number] Age: 68 Admit Type: Outpatient Procedure:                Upper EUS Indications:              Dilated pancreatic duct on CT scan, Pancreatic cyst                            on CT scan, Chronic pancreatitis, Chronic recurrent                            pancreatitis Providers:                Justice Britain, MD, Jeanella Cara, RN,                            Elspeth Cho Tech., Technician, Dellie Catholic,                            CRNA Referring MD:              Medicines:                General Anesthesia Complications:            No immediate complications. Estimated Blood Loss:     Estimated blood loss was minimal. Procedure:                Pre-Anesthesia Assessment:                           - Prior to the procedure, a History and Physical                            was performed, and patient medications and                            allergies were reviewed. The patient's tolerance of                            previous anesthesia was also reviewed. The risks                            and benefits of the procedure and the sedation                            options and risks were discussed with the patient.                            All questions were answered, and informed consent                            was obtained. Prior Anticoagulants: The patient has                            taken Xarelto (rivaroxaban), last dose  was 3 days                            prior to procedure. ASA Grade Assessment: III - A                            patient with severe systemic disease. After                            reviewing the risks and benefits, the patient was                            deemed in satisfactory condition to undergo the                            procedure.  After obtaining informed consent, the endoscope was                            passed under direct vision. Throughout the                            procedure, the patient's blood pressure, pulse, and                            oxygen saturations were monitored continuously. The                            [id number]-H190 ([id number]) Olympus gastroscope was                            introduced through the mouth, and advanced to the                            second part of duodenum. The GF-UTC180 ([id number])                            Olympus Linear EUS scope was introduced through the                            mouth, and advanced to the duodenum for ultrasound                            examination from the stomach and duodenum. The                            upper EUS was accomplished without difficulty. The                            patient tolerated the procedure. Scope In: Scope Out: Findings:      ENDOSCOPIC FINDING: :      No gross lesions were noted in the entire esophagus.      The Z-line was regular and was found 45 cm from the incisors.      A few dispersed, small  non-bleeding erosions were found in the gastric       antrum. There were no stigmata of recent bleeding.      J-shaped stomach in the body. No other gross lesions were noted in the       entire examined stomach.      A moderate extrinsic deformity was found in the D1/D2 angle/sweep.      The ampulla was congested, edematous with a small orifice noted.      Localized moderately erythematous mucosa without active bleeding and       with no stigmata of bleeding was found just distal to the ampulla on the       right-lateral wall. This was biopsied with a cold forceps for histology.      ENDOSONOGRAPHIC FINDING: :      Pancreatic parenchymal abnormalities were noted in the entire pancreas.       These consisted of hyperechoic foci with shadowing, lobularity with       honeycombing and cysts.      The pancreatic duct had a  dilated endosonographic appearance, had       intraductal stone in the neck of the pancreas, had an irregularly       contoured endosonographic appearance, had a prominently branched       endosonographic appearance and had a tortuous/ectatic appearance. The       pancreatic duct in the neck measured 9.8 mm -> 11.3 mm. The pancreatic       duct in the body measured 10.4 mm. The pancreatic duct in the tail       measured 4.4 mm -> 2.8 mm)      The pancreatic duct had what appeared to be non-shadowing sludge/stone       material within in the genu of the pancreas, this is different than       prior EUS concerning for possible mural nodularity.      Anechoic lesions suggestive of two cysts were identified in the       pancreatic head and genu of the pancreas. The first cyst measured 27 mm       by 17 mm and was found in the neck of the pancreas region. There was       some internal debris but no mural nodularity. The second cyst measured       21 mm by 18 mm and was found in the head of the pancreas region (this is       new compared to previous EUS). There was no associated mass or mural       nodularity.      There was no sign of significant endosonographic abnormality in the       common bile duct (5 mm) and in the gallbladder. No stones and no biliary       sludge were identified.      One enlarged lymph node was visualized in the peripancreatic region. It       measured 7 mm by 4 mm in maximal cross-sectional diameter. The node was       triangular, isoechoic and had well defined margins. It was not       malignant-appearing.      Endosonographic imaging in the visualized portion of the liver showed no       mass-lesion.      The celiac region was visualized. Impression:               EGD  Impression:                           - No gross lesions in esophagus. Z-line regular, 45                            cm from the incisors.                           - Non-bleeding erosive  gastropathy. J-shaped                            deformity of the stomach. No other gross lesions in                            the stomach.                           - Duodenal deformity at D1/D2 angle traversed with                            the EGD/Duodenoscope.                           - Ampulla was congested and edematous.                           - Erythematous duodenopathy. Biopsied.                           EUS Impression:                           - Pancreatic parenchymal abnormalities consisting                            of hyperechoic foci, lobularity with honeycombing                            and cysts were noted in the entire pancreas.                           - The pancreatic duct had a dilated endosonographic                            appearance, had intraductal stone, had an                            irregularly contoured endosonographic appearance,                            had a prominently branched endosonographic                            appearance and had a tortuous/ectatic appearance in  the main pancreatic duct.                           - The pancreatic duct had material within in the                            genu of the pancreas query sludge.                           - Two cystic lesions were seen in the pancreatic                            head and genu of the pancreas. Tissue has not been                            obtained. However, the endosonographic appearance                            is consistent with a pancreatic pseudocysts - the                            one in the head is new compared to prior EUS.                           - There was no sign of significant pathology in the                            common bile duct and in the gallbladder.                           - One enlarged lymph node was visualized in the                            peripancreatic region. Tissue has not been                             obtained. However, the endosonographic appearance                            is consistent with benign inflammatory changes. Recommendation:           - Proceed at attempt at ERCP to try and access PD                            and see if able to sample fluid directly for                            CEA/Amylase. Holding off on PD sampling for today.                           - Xarelto restart to be noted on ERCP report.                           -  Continue PPI at current dosing.                           - The findings and recommendations were discussed                            with the patient.                           - The findings and recommendations were discussed                            with the patient's family. Procedure Code(s):        --- Professional ---                           [phone], Esophagogastroduodenoscopy, flexible,                            transoral; with endoscopic ultrasound examination                            limited to the esophagus, stomach or duodenum, and                            adjacent structures                           43239, Esophagogastroduodenoscopy, flexible,                            transoral; with biopsy, single or multiple Diagnosis Code(s):        --- Professional ---                           K31.89, Other diseases of stomach and duodenum                           K86.9, Disease of pancreas, unspecified                           K86.2, Cyst of pancreas                           K86.89, Other specified diseases of pancreas                           R93.3, Abnormal findings on diagnostic imaging of                            other parts of digestive tract                           R59.0, Localized enlarged lymph nodes                           K86.1, Other chronic pancreatitis CPT copyright 2019 American Medical  Association. All rights reserved. The codes documented in this report are preliminary and upon coder review may  be  revised to meet current compliance requirements. Justice Britain, MD 02/04/2019 11:27:53 AM Number of Addenda: 0

## 2019-02-04 NOTE — Interval H&P Note (Signed)
History and Physical Interval Note:  02/04/2019 7:35 AM  Timothy Martinez  has presented today for surgery, with the diagnosis of Chronic Pancreatitis, Abnormal CT of  abdomen , Gerd.  The various methods of treatment have been discussed with the patient and family. After consideration of risks, benefits and other options for treatment, the patient has consented to  Procedure(s): UPPER ESOPHAGEAL ENDOSCOPIC ULTRASOUND (EUS) (N/A) ENDOSCOPIC RETROGRADE CHOLANGIOPANCREATOGRAPHY (ERCP) (N/A) as a surgical intervention.  The patient's history has been reviewed, patient examined, no change in status, stable for surgery.  I have reviewed the patient's chart and labs.  Questions were answered to the patient's satisfaction.     The risks and benefits of endoscopic evaluation were discussed with the patient; these include but are not limited to the risk of perforation, infection, bleeding, missed lesions, lack of diagnosis, severe illness requiring hospitalization, as well as anesthesia and sedation related illnesses.  The patient is agreeable to proceed.    The risks of an ERCP were discussed at length, including but not limited to the risk of perforation, bleeding, abdominal pain, post-ERCP pancreatitis (while usually mild can be severe and even life threatening).   [employer]

## 2019-02-04 NOTE — Anesthesia Procedure Notes (Signed)
Procedure Name: Intubation Date/Time: 02/04/2019 8:21 AM Performed by: Candis Shine, CRNA Pre-anesthesia Checklist: Patient identified, Emergency Drugs available, Suction available and Patient being monitored Patient Re-evaluated:Patient Re-evaluated prior to induction Oxygen Delivery Method: Circle System Utilized Preoxygenation: Pre-oxygenation with 100% oxygen Induction Type: IV induction and Rapid sequence Laryngoscope Size: Mac and 4 Grade View: Grade I Tube type: Oral Tube size: 7.5 mm Number of attempts: 1 Airway Equipment and Method: Stylet Placement Confirmation: ETT inserted through vocal cords under direct vision,  positive ETCO2 and breath sounds checked- equal and bilateral Secured at: 22 cm Tube secured with: Tape Dental Injury: Teeth and Oropharynx as per pre-operative assessment

## 2019-02-04 NOTE — Op Note (Signed)
[facility] Patient Name: Timothy Martinez Procedure Date : 02/04/2019 MRN: [id number] Attending MD: Justice Britain , MD Date of Birth: 1951-06-02 CSN: [id number] Age: 68 Admit Type: Outpatient Procedure:                ERCP Indications:              Abnormal abdominal CT, Chronic pancreatitis on                            Computed Tomogram Scan, Pancreatic duct stone,                            Pancreatic pseudocyst Providers:                Justice Britain, MD, Jeanella Cara, RN,                            Elspeth Cho Tech., Technician, Dellie Catholic,                            CRNA Referring MD:             [name] Medicines:                General Anesthesia, Cipro 400 mg IV, Indomethacin                            034 mg PR Complications:            No immediate complications. Estimated Blood Loss:     Estimated blood loss was minimal. Procedure:                Pre-Anesthesia Assessment:                           - Prior to the procedure, a History and Physical                            was performed, and patient medications and                            allergies were reviewed. The patient's tolerance of                            previous anesthesia was also reviewed. The risks                            and benefits of the procedure and the sedation                            options and risks were discussed with the patient.                            All questions were answered, and informed consent                            was obtained. Prior Anticoagulants: The patient  has                            taken Xarelto (rivaroxaban), last dose was 3 days                            prior to procedure. ASA Grade Assessment: III - A                            patient with severe systemic disease. After                            reviewing the risks and benefits, the patient was                            deemed in satisfactory condition to  undergo the                            procedure.                           After obtaining informed consent, the scope was                            passed under direct vision. Throughout the                            procedure, the patient's blood pressure, pulse, and                            oxygen saturations were monitored continuously. The                            [id number]-Q180V ([id number]) Olympus duodenoscope was                            introduced through the mouth, and used to inject                            contrast into without successful cannulation. The                            ERCP was technically difficult and complex due to                            challenging cannulation. The patient tolerated the                            procedure. Scope In: Scope Out: Findings:      The scout film was normal.      The upper GI tract was traversed under direct vision without detailed       examination. A J-shaped deformity was found in the gastric body. To get       into position, the patient required positioning in the Left Lateral  perspective and with pressure on the abdomen. Then the scope was able to       traverse into the Duodenum. Extrinsic impression on the duodenum was       found in the D1/D2 angle/sweep. The major papilla was bulging,       congested, edematous. The orifice was small.      The ventral pancreatic duct could not be cannulated with the Autotome       sphincterotome and Jagtome sphincterotome using multiple wires including       the 0.035 wire, 0.025 wire, and the angled wire.      A pancreatogram was not performed.      The duodenoscope was withdrawn from the patient. Impression:               - J-shaped deformity in the gastric body.                           - Duodenal extrinsic impression found in the D1/D2                            region.                           - The major papilla appeared to be bulging,                             congested, edematous with a small orifice                           - Failed ERCP cannulation of the pancreatic duct. Recommendation:           - The patient will be observed post-procedure,                            until all discharge criteria are met.                           - Discharge patient to home.                           - Patient has a contact number available for                            emergencies. The signs and symptoms of potential                            delayed complications were discussed with the                            patient. Return to normal activities tomorrow.                            Written discharge instructions were provided to the                            patient.                           -  Observe patient's clinical course.                           - Continue PERT.                           - OK to restart Xarelto in 48 hours.                           - Discussion with patient and family about possible                            referral to [facility] to discuss repeat                            ERCP +/- EUS. ERCP to attempt to place PD stent and                            aid in recurrent bouts of pancreatitis and also to                            sample the PD. If If unsuccessful then consider                            repeat EUS and sampling of the PD to ensure no                            evidence of MD-IPMN.                           - The findings and recommendations were discussed                            with the patient.                           - The findings and recommendations were discussed                            with the patient's family. Procedure Code(s):        --- Professional ---                           [phone], Esophagogastroduodenoscopy, flexible,                            transoral; diagnostic, including collection of                            specimen(s) by brushing or washing, when performed                             (separate procedure) Diagnosis Code(s):        --- Professional ---  K31.89, Other diseases of stomach and duodenum                           K86.1, Other chronic pancreatitis                           K86.89, Other specified diseases of pancreas                           K86.3, Pseudocyst of pancreas                           K83.8, Other specified diseases of biliary tract                           R93.5, Abnormal findings on diagnostic imaging of                            other abdominal regions, including retroperitoneum CPT copyright 2019 American Medical Association. All rights reserved. The codes documented in this report are preliminary and upon coder review may  be revised to meet current compliance requirements. Justice Britain, MD 02/04/2019 11:15:31 AM Number of Addenda: 0

## 2019-02-04 NOTE — Anesthesia Postprocedure Evaluation (Addendum)
Anesthesia Post Note  Patient: Timothy Martinez  Procedure(s) Performed: UPPER ESOPHAGEAL ENDOSCOPIC ULTRASOUND (EUS) (N/A ) ENDOSCOPIC RETROGRADE CHOLANGIOPANCREATOGRAPHY (ERCP) (N/A ) ESOPHAGOGASTRODUODENOSCOPY (EGD) WITH PROPOFOL (N/A ) BIOPSY     Patient location during evaluation: PACU Anesthesia Type: General Level of consciousness: awake and alert Pain management: pain level controlled Vital Signs Assessment: post-procedure vital signs reviewed and stable Respiratory status: spontaneous breathing, nonlabored ventilation and respiratory function stable Cardiovascular status: blood pressure returned to baseline and stable Postop Assessment: no apparent nausea or vomiting Anesthetic complications: no    Last Vitals:  Vitals:   02/04/19 1150 02/04/19 1205  BP: 113/88 126/68  Pulse: 70 68  Resp: 16 (!) 25  Temp: (!) 36.4 C   SpO2: 91% 92%    Last Pain:  Vitals:   02/04/19 1150  TempSrc:   PainSc: 0-No pain                 Lidia Collum

## 2019-02-04 NOTE — Transfer of Care (Signed)
Immediate Anesthesia Transfer of Care Note  Patient: Timothy Martinez  Procedure(s) Performed: UPPER ESOPHAGEAL ENDOSCOPIC ULTRASOUND (EUS) (N/A ) ENDOSCOPIC RETROGRADE CHOLANGIOPANCREATOGRAPHY (ERCP) (N/A ) ESOPHAGOGASTRODUODENOSCOPY (EGD) WITH PROPOFOL (N/A ) BIOPSY  Patient Location: PACU  Anesthesia Type:General  Level of Consciousness: awake, alert  and oriented  Airway & Oxygen Therapy: Patient Spontanous Breathing and Patient connected to nasal cannula oxygen  Post-op Assessment: Report given to RN and Post -op Vital signs reviewed and stable  Post vital signs: Reviewed and stable  Last Vitals:  Vitals Value Taken Time  BP 121/69 02/04/19 1102  Temp    Pulse 72 02/04/19 1103  Resp 16 02/04/19 1103  SpO2 100 % 02/04/19 1103  Vitals shown include unvalidated device data.  Last Pain:  Vitals:   02/04/19 0738  TempSrc: Oral  PainSc: 0-No pain         Complications: No apparent anesthesia complications

## 2019-02-05 ENCOUNTER — Encounter: Payer: Self-pay | Admitting: Gastroenterology

## 2019-02-05 NOTE — Progress Notes (Signed)
Patty, Thank you for the update. I would recommend Arlice Colt or Gayleen Orem at [facility]. They are part of the [facility] at [facility]. Please let his daughter [name] know, they would be in good hands with either of them. I won't be able to help get them an earlier appointment, but it is worthwhile for them to move forward with the discussion. Please let me know what they decide. Thanks. GM

## 2019-02-05 NOTE — Progress Notes (Signed)
Dr Rush Landmark I spoke with the pt and he does not wish to have the referral at this time.  He says his daughter works for [employer] and is trying to get him an appointment with them.  He will let me know who to send the records to when his daughter contacts him with that information.

## 2019-02-05 NOTE — Progress Notes (Signed)
Thank you for update. Let's get a follow up with me in 4-6 weeks with Mr. Racca on the books to see where things are. Thanks. GM

## 2019-02-05 NOTE — Progress Notes (Signed)
Discussed case with Dr. Obie Dredge briefly on 02/04/2019. Spoke with patient as well as daughter and they agree that they would be happy to have a referral to discuss possible repeat attempt at ERCP for PD evaluation plus minus EUS for sampling to ensure no evidence of IPMN. I have called to set up a clinic appointment with the advanced endoscopy group and have left messages for them. I will have my staff go ahead and put together the patient's referral packet and send it. He can be seen by any of the advance endoscopy group ([facility]) for clinic visit next available.  Justice Britain, MD [facility] Office # [phone]

## 2019-02-05 NOTE — Progress Notes (Signed)
03/14/19 at 1130 am appt with Dr Jerilynn Mages pt aware

## 2019-02-05 NOTE — Progress Notes (Signed)
The pt's daughter has been advised of the recommendations and records faxed per her request to [phone]

## 2019-02-06 ENCOUNTER — Encounter (HOSPITAL_COMMUNITY): Payer: Self-pay | Admitting: Gastroenterology

## 2019-02-14 ENCOUNTER — Telehealth: Payer: Self-pay | Admitting: Gastroenterology

## 2019-02-14 NOTE — Telephone Encounter (Signed)
Per request of the pt's daughter I have faxed the last office note to [facility]  She will call if anything further is needed.

## 2019-02-16 ENCOUNTER — Other Ambulatory Visit: Payer: Self-pay | Admitting: Family Medicine

## 2019-02-26 ENCOUNTER — Other Ambulatory Visit: Payer: Self-pay | Admitting: Family Medicine

## 2019-02-27 MED ORDER — METFORMIN HCL 1000 MG PO TABS: 1000.0000 mg | ORAL_TABLET | Freq: Two times a day (BID) | ORAL | 3 refills | Status: DC

## 2019-02-27 MED ORDER — GABAPENTIN 100 MG PO CAPS: ORAL_CAPSULE | ORAL | 2 refills | Status: DC

## 2019-03-05 ENCOUNTER — Encounter: Payer: Self-pay | Admitting: Family Medicine

## 2019-03-05 ENCOUNTER — Other Ambulatory Visit: Payer: Self-pay

## 2019-03-05 ENCOUNTER — Ambulatory Visit (INDEPENDENT_AMBULATORY_CARE_PROVIDER_SITE_OTHER): Payer: Medicare Other | Admitting: Family Medicine

## 2019-03-05 VITALS — BP 110/72 | HR 79 | Wt 166.2 lb

## 2019-03-05 DIAGNOSIS — M25512 Pain in left shoulder: Secondary | ICD-10-CM | POA: Insufficient documentation

## 2019-03-05 DIAGNOSIS — K860 Alcohol-induced chronic pancreatitis: Secondary | ICD-10-CM

## 2019-03-05 DIAGNOSIS — H6192 Disorder of left external ear, unspecified: Secondary | ICD-10-CM | POA: Diagnosis not present

## 2019-03-05 DIAGNOSIS — H9392 Unspecified disorder of left ear: Secondary | ICD-10-CM

## 2019-03-05 MED ORDER — GABAPENTIN 100 MG PO CAPS: ORAL_CAPSULE | ORAL | 2 refills | Status: AC

## 2019-03-05 MED ORDER — METFORMIN HCL 1000 MG PO TABS: 1000.0000 mg | ORAL_TABLET | Freq: Two times a day (BID) | ORAL | 3 refills | Status: AC

## 2019-03-05 NOTE — Assessment & Plan Note (Signed)
New, unsure of how it occurred but appears to be a cut of the left auricle. No signs of mass, discoloration, or infection. - wrapped and bacitracin ointment applied today. - Cont to monitor and if not healing in 2 weeks patient will return to clinic - Given this is in a sensitive area with a lot of sun exposure I will consider a referral to our dermatology clinic for further evaluation if it is not healing.

## 2019-03-05 NOTE — Assessment & Plan Note (Signed)
Doing well, remains sober. No further attacks since last visit.  - Cont to follow up with GI at [facility]. Appreciate their care and help in management - Cont Creon that he gets from GI - Cont Ensure shakes as needed

## 2019-03-05 NOTE — Patient Instructions (Signed)
It was great to see you today! Thank you for letting me participate in your care!  Today, we discussed your chronic pancreatitis and I am glad the Creon is helping you. Please continue to follow up with GI and keep me informed of what you and they decide going forward.   For your left shoulder pain I believe it could be some early onset arthritis vs mild tendonitis. I have given you some exercises at home to do and use the muscle cream you already have. If it does not improve in 6-8 weeks please return to the clinic and we will go to the next step.  For your left ear please call me in 2 weeks if that is not better.  Be well, Harolyn Rutherford, DO PGY-3, [facility]

## 2019-03-05 NOTE — Assessment & Plan Note (Signed)
Early onset OA vs mild tendonitis of the RC. I do not suspect a tear or impingement as he had negative hawkins and empty can test.  - Tylenol as needed and use of OTC muscle cream he has at home - Given rehab exercises to do at home for 4-6 weeks for improvement of symptoms. - If no improvement patient will return to clinic for diagnostic x-ray and consider steroid injection

## 2019-03-05 NOTE — Progress Notes (Signed)
Subjective: Chief Complaint  Patient presents with  . Hospitalization Follow-up   HPI: Timothy Martinez is a 68 y.o. presenting to clinic today to discuss the following:  Chronic Pancreatitis Patient states he had EDG performed by GI about one month ago that was unable to fully assess his pancreas. He has been referred to [place] for consultation with GI there about possible ERCP/surgery for further evaluation and management for suspect stones in his pancreas and for pseudocysts of his pancreas. He was started on Creon recently and since then denies any recent abdominal pain, nausea, vomiting. He is eating and gaining weight.  Left Shoulder Pain Patient states for the past month he has had a pain in his left shoulder that feels like a "toothache". He states it is worse in the mornings and endorses not wanting to sleep on that side. He also states it gets worse with movement and use during the day. He has never had this before and endorses no trauma to his shoulder. No pops, cracks, or snaps felt with any movement and has not been doing anything out of the ordinary in terms of activity.   Left ear pain Patient has appearance of old cut on the auricle of his left ear. He is unsure of how it happened. No erythema, swelling, or drainage. He states it is painful and wants it wrapped up as it is tender to touch.   ROS noted in HPI.   Past Medical, Surgical, Social, and Family History Reviewed & Updated per EMR.   Pertinent Historical Findings include:   Social History   Tobacco Use  Smoking Status Current Every Day Smoker  . Packs/day: 1.00  . Years: 52.00  . Pack years: 52.00  . Types: Cigarettes  . Start date: 04/09/1964  Smokeless Tobacco Never Used  Tobacco Comment   down to one pack daily    Objective: BP 110/72   Pulse 79   Wt 166 lb 3.2 oz (75.4 kg)   SpO2 98%   BMI 20.77 kg/m  Vitals and nursing notes reviewed  Physical Exam Gen: Alert and Oriented x  3, NAD HEENT: Normocephalic, atraumatic    Neck: trachea midline, no thyroidmegaly, no LAD MSK: Shoulder, Left: No evidence of bony deformity, asymmetry, or muscle atrophy; No tenderness over long head of biceps (bicipital groove). Not TTP at [facility] joint. Full active and passive range of motion (180 flex Huel Cote /150Abd /90ER /70IR), Thumb to T8 without significant tenderness. Strength 5/5 throughout. No abnormal scapular function observed. Sensation intact. Peripheral pulses intact.  Special Tests:   - Crossarm test: NEG   - Empty can: NEG   - Hawkins: NEG   - Neer test: NEG   - Obrien's test: NEG   - Crank test: NEG  Ext: no clubbing, cyanosis, or edema Skin: warm, dry, intact, no rashes Assessment/Plan:  Chronic alcoholic pancreatitis ([place]) Doing well, remains sober. No further attacks since last visit.  - Cont to follow up with GI at [facility]. Appreciate their care and help in management - Cont Creon that he gets from GI - Cont Ensure shakes as needed  Left shoulder pain Early onset OA vs mild tendonitis of the RC. I do not suspect a tear or impingement as he had negative hawkins and empty can test.  - Tylenol as needed and use of OTC muscle cream he has at home - Given rehab exercises to do at home for 4-6 weeks for improvement of symptoms. -  If no improvement patient will return to clinic for diagnostic x-ray and consider steroid injection  Lesion of left ear New, unsure of how it occurred but appears to be a cut of the left auricle. No signs of mass, discoloration, or infection. - wrapped and bacitracin ointment applied today. - Cont to monitor and if not healing in 2 weeks patient will return to clinic - Given this is in a sensitive area with a lot of sun exposure I will consider a referral to our dermatology clinic for further evaluation if it is not healing.   PATIENT EDUCATION PROVIDED: See AVS    Diagnosis and plan along with any newly prescribed  medication(s) were discussed in detail with this patient today. The patient verbalized understanding and agreed with the plan. Patient advised if symptoms worsen return to clinic or ER.   Meds ordered this encounter  Medications  . gabapentin (NEURONTIN) 100 MG capsule    Sig: TAKE 1 CAPSULE BY MOUTH THREE TIMES A DAY    Dispense:  270 capsule    Refill:  2  . metFORMIN (GLUCOPHAGE) 1000 MG tablet    Sig: Take 1 tablet (1,000 mg total) by mouth 2 (two) times daily with a meal.    Dispense:  180 tablet    Refill:  Camp Wood, DO 03/05/2019, 8:42 AM PGY-3 [place]

## 2019-03-14 ENCOUNTER — Ambulatory Visit: Payer: Medicare Other | Admitting: Gastroenterology

## 2019-03-27 ENCOUNTER — Other Ambulatory Visit: Payer: Self-pay

## 2019-03-27 MED ORDER — FAMOTIDINE 20 MG PO TABS: 20.0000 mg | ORAL_TABLET | Freq: Two times a day (BID) | ORAL | 3 refills | Status: DC

## 2019-03-28 DIAGNOSIS — Z7901 Long term (current) use of anticoagulants: Secondary | ICD-10-CM | POA: Diagnosis not present

## 2019-03-28 DIAGNOSIS — K8689 Other specified diseases of pancreas: Secondary | ICD-10-CM | POA: Diagnosis not present

## 2019-03-28 DIAGNOSIS — J449 Chronic obstructive pulmonary disease, unspecified: Secondary | ICD-10-CM | POA: Diagnosis not present

## 2019-03-28 DIAGNOSIS — I4891 Unspecified atrial fibrillation: Secondary | ICD-10-CM | POA: Diagnosis not present

## 2019-03-29 ENCOUNTER — Ambulatory Visit (INDEPENDENT_AMBULATORY_CARE_PROVIDER_SITE_OTHER): Payer: Medicare Other | Admitting: Family Medicine

## 2019-03-29 ENCOUNTER — Encounter: Payer: Self-pay | Admitting: Family Medicine

## 2019-03-29 ENCOUNTER — Other Ambulatory Visit: Payer: Self-pay

## 2019-03-29 VITALS — BP 118/68 | HR 80

## 2019-03-29 DIAGNOSIS — K861 Other chronic pancreatitis: Secondary | ICD-10-CM | POA: Diagnosis not present

## 2019-03-29 DIAGNOSIS — M25512 Pain in left shoulder: Secondary | ICD-10-CM

## 2019-03-29 MED ORDER — OXYCODONE HCL 5 MG PO CAPS: 5.0000 mg | ORAL_CAPSULE | ORAL | 0 refills | Status: AC | PRN

## 2019-03-29 MED ORDER — METHYLPREDNISOLONE ACETATE 40 MG/ML IJ SUSP
40.0000 mg | Freq: Once | INTRAMUSCULAR | Status: AC
  Administered 2019-03-29: 40 mg via INTRA_ARTICULAR

## 2019-03-29 NOTE — Assessment & Plan Note (Signed)
Concerned about acute on chronic pancreatitis vs chronic pain from chronic pancreatitis. Reassured that he is not vomiting, pain is intermittent and tolerable, and he is still eating and drinking normally. - CBC, CMP, and Lipase today - Oxy 5mg  IR prn 5 day supply to help keep pain controlled so he won't need to go to the ED - Emergency care precautions discussed with patient

## 2019-03-29 NOTE — Patient Instructions (Addendum)
It was great to see you today! Thank you for letting me participate in your care!  Today, we discussed your left shoulder pain that did not get better with exercises but I am glad the cream has helped. I gave you an injection of steroid today and please let me know if you pain returns. If it does you may benefit from formal physical therapy and evaluation by a Sports Medicine docotor/Orthopedic surgeon for further options.  I gave you some pain medication for your chronic pancreatitis pain. If it gets worse and you develop nausea with vomiting please call the ED. I have gotten some labs to ensure you are not having another flare. I will call you if they are abnormal.  Be well, Harolyn Rutherford, DO PGY-3, [facility]

## 2019-03-29 NOTE — Assessment & Plan Note (Signed)
Positive Neer sign with limited passive ROM in flexion and internal and external rotation supports some RC tendonitis present. I still suspect he may have an element of OA as well. No tear suspected as empty can is negative and strength is symmetrical.  - Corticosteroid injection today; follow up with me if no improvement. - Refer to formal physical therapy and x-ray of left shoulder if steroid offers minimal improvement

## 2019-03-29 NOTE — Progress Notes (Addendum)
Subjective: Chief Complaint  Patient presents with  . Shoulder Pain  . problem with pancreas    HPI: Timothy Martinez is a 68 y.o. presenting to clinic today to discuss the following:  Chronic Pancreatitis Patient reports for the past few weeks about one day per week he has been having epigastric abdominal pain that feels like his previous episodes of pancreatitis. He is not vomiting, not really nauseated, and is still able to eat and drink. His appetite is good. He simply lays still and curls up in a ball to deal with the pain. Over time the pain will subside.  Left Shoulder Pain Patient was compliant with cream and exercises but states his shoulder is not better and may actually hurt worse today. No change in the quality or location of the pain it is just more frequent and more intense than it was during his last appointment.   ROS noted in HPI.   Past Medical, Surgical, Social, and Family History Reviewed & Updated per EMR.   Pertinent Historical Findings include:   Social History   Tobacco Use  Smoking Status Current Every Day Smoker  . Packs/day: 1.00  . Years: 52.00  . Pack years: 52.00  . Types: Cigarettes  . Start date: 04/09/1964  Smokeless Tobacco Never Used  Tobacco Comment   down to one pack daily    Objective: BP 118/68   Pulse 80   SpO2 98%  Vitals and nursing notes reviewed  Physical Exam Gen: Alert and Oriented x 3, NAD HEENT: Normocephalic, atraumatic Abd: non-distended, non-tender, soft, +bs in all four quadrants, no rebound tenderness, no guarding MSK: Shoulder, Left:  No evidence of bony deformity, asymmetry, or muscle atrophy; No tenderness over long head of biceps (bicipital groove). NoTTP at [facility] joint. Decreased active ROM due to pain and passive range of motion normal (180 flex Huel Cote /150Abd /90ER /70IR), Strength 5/5 throughout. Sensation intact. Peripheral pulses intact.  Special Tests:   - Crossarm test: NEG   - Empty can: NEG   -  Hawkins: NEG   - Neer test: Positive Ext: no clubbing, cyanosis, or edema Skin: warm, dry, intact, no rashes  Assessment/Plan:  Left shoulder pain Positive Neer sign with limited passive ROM in flexion and internal and external rotation supports some RC tendonitis present. I still suspect he may have an element of OA as well. No tear suspected as empty can is negative and strength is symmetrical.  - Corticosteroid injection today; follow up with me if no improvement. - Refer to formal physical therapy and x-ray of left shoulder if steroid offers minimal improvement  Chronic pancreatitis ([place]) Concerned about acute on chronic pancreatitis vs chronic pain from chronic pancreatitis. Reassured that he is not vomiting, pain is intermittent and tolerable, and he is still eating and drinking normally. - CBC, CMP, and Lipase today - Oxy 5mg  IR prn 5 day supply to help keep pain controlled so he won't need to go to the ED - Emergency care precautions discussed with patient  INJECTION: Shoulder, Right Patient was given informed consent, signed copy in the chart. Appropriate time out was taken. Area prepped and draped in usual sterile fashion.  1cc of methylprednisolone 40 mg/ml plus  4 cc of 1% lidocaine without epinephrine was injected into the right shoulder using a(n) posterior lateral approach into the subacromial space. The patient tolerated the procedure well. There were no complications. Post procedure instructions were given.   PATIENT EDUCATION PROVIDED: See AVS  Diagnosis and plan along with any newly prescribed medication(s) were discussed in detail with this patient today. The patient verbalized understanding and agreed with the plan. Patient advised if symptoms worsen return to clinic or ER.   Orders Placed This Encounter  Procedures  . Comprehensive metabolic panel    Order Specific Question:   Has the patient fasted?    Answer:   No  . CBC with Differential  . Lipase    Meds  ordered this encounter  Medications  . oxycodone (OXY-IR) 5 MG capsule    Sig: Take 1 capsule (5 mg total) by mouth every 4 (four) hours as needed for up to 5 days.    Dispense:  30 capsule    Refill:  0     Harolyn Rutherford, DO 03/29/2019, 9:04 AM PGY-3 [place]

## 2019-03-30 LAB — COMPREHENSIVE METABOLIC PANEL
ALT: 32 IU/L (ref 0–44)
AST: 33 IU/L (ref 0–40)
Albumin/Globulin Ratio: 1.2 (ref 1.2–2.2)
Albumin: 4 g/dL (ref 3.8–4.8)
Alkaline Phosphatase: 155 IU/L — ABNORMAL HIGH (ref 39–117)
BUN/Creatinine Ratio: 14 (ref 10–24)
BUN: 10 mg/dL (ref 8–27)
Bilirubin Total: 0.3 mg/dL (ref 0.0–1.2)
CO2: 24 mmol/L (ref 20–29)
Calcium: 9.7 mg/dL (ref 8.6–10.2)
Chloride: 103 mmol/L (ref 96–106)
Creatinine, Ser: 0.69 mg/dL — ABNORMAL LOW (ref 0.76–1.27)
GFR calc Af Amer: 114 mL/min/{1.73_m2} (ref >59)
GFR calc non Af Amer: 98 mL/min/{1.73_m2} (ref >59)
Globulin, Total: 3.4 g/dL (ref 1.5–4.5)
Glucose: 84 mg/dL (ref 65–99)
Potassium: 4.6 mmol/L (ref 3.5–5.2)
Sodium: 140 mmol/L (ref 134–144)
Total Protein: 7.4 g/dL (ref 6.0–8.5)

## 2019-03-30 LAB — CBC WITH DIFFERENTIAL/PLATELET
Basophils Absolute: 0.1 10*3/uL (ref 0.0–0.2)
Basos: 1 %
EOS (ABSOLUTE): 0.2 10*3/uL (ref 0.0–0.4)
Eos: 2 %
Hematocrit: 41.1 % (ref 37.5–51.0)
Hemoglobin: 13.6 g/dL (ref 13.0–17.7)
Immature Grans (Abs): 0 10*3/uL (ref 0.0–0.1)
Immature Granulocytes: 0 %
Lymphocytes Absolute: 2.6 10*3/uL (ref 0.7–3.1)
Lymphs: 23 %
MCH: 29 pg (ref 26.6–33.0)
MCHC: 33.1 g/dL (ref 31.5–35.7)
MCV: 88 fL (ref 79–97)
Monocytes Absolute: 1.1 10*3/uL — ABNORMAL HIGH (ref 0.1–0.9)
Monocytes: 9 %
Neutrophils Absolute: 7.3 10*3/uL — ABNORMAL HIGH (ref 1.4–7.0)
Neutrophils: 65 %
Platelets: 420 10*3/uL (ref 150–450)
RBC: 4.69 x10E6/uL (ref 4.14–5.80)
RDW: 13.2 % (ref 11.6–15.4)
WBC: 11.4 10*3/uL — ABNORMAL HIGH (ref 3.4–10.8)

## 2019-03-30 LAB — LIPASE: Lipase: 56 U/L (ref 13–78)

## 2019-04-05 ENCOUNTER — Other Ambulatory Visit: Payer: Self-pay | Admitting: Family Medicine

## 2019-05-01 ENCOUNTER — Other Ambulatory Visit: Payer: Self-pay

## 2019-05-01 ENCOUNTER — Ambulatory Visit (INDEPENDENT_AMBULATORY_CARE_PROVIDER_SITE_OTHER): Payer: Medicare Other | Admitting: Family Medicine

## 2019-05-01 ENCOUNTER — Encounter: Payer: Self-pay | Admitting: Family Medicine

## 2019-05-01 VITALS — BP 117/75 | HR 76 | Wt 162.6 lb

## 2019-05-01 DIAGNOSIS — K861 Other chronic pancreatitis: Secondary | ICD-10-CM | POA: Diagnosis not present

## 2019-05-01 MED ORDER — OXYCODONE HCL 5 MG PO CAPS: 5.0000 mg | ORAL_CAPSULE | ORAL | 0 refills | Status: AC | PRN

## 2019-05-01 NOTE — Patient Instructions (Addendum)
It was great to see you today! Thank you for letting me participate in your care!  Today, we discussed your continued chronic pancreatitis. I have given you a short course of opioids to help manage the pain. Please use them sparringly. I have also referred you to a pain management clinic to help with pain management until your surgery. If you don't hear anything from them in two weeks please let me know.  Be well, Harolyn Rutherford, DO PGY-3, [facility]

## 2019-05-01 NOTE — Progress Notes (Signed)
Subjective: HPI: Timothy Martinez is a 68 y.o. presenting to clinic today to discuss the following:  Chronic Pancreatitis Patient presents today with continued complications of chronic pancreatitis. He has had mostly controlled pain but still has times where it gets intense. We discussed until he has his surgery and possibly after he has his surgery this may be an ongoing issue that we will not be able to fully resolve. He is eating well, has a normal appetite, and has no nausea or vomiting. He does request for a referral today to pain management clinic.   He also reports the pain in his shoulder is better since his injection however a new pain has migrated to the left front side of his upper chest that self resolved and is not present now. It does not radiate, does not come with exertion, and feels sharp, not dull and no pressure. He has not experienced any shortness of breath or difficulty breathing, or radiating pain during these episodes.    No fever, chills, abdominal pain, diarrheas, or constipation.  ROS noted in HPI.    Social History   Tobacco Use  Smoking Status Current Every Day Smoker  . Packs/day: 1.00  . Years: 52.00  . Pack years: 52.00  . Types: Cigarettes  . Start date: 04/09/1964  Smokeless Tobacco Never Used  Tobacco Comment   down to one pack daily    Objective: BP 117/75   Pulse 76   Wt 162 lb 9.6 oz (73.8 kg)   SpO2 96%   BMI 20.32 kg/m  Vitals and nursing notes reviewed  Physical Exam Gen: Alert and Oriented x 3, NAD HEENT: Normocephalic, atraumatic Chest Wall: no obvious deformity, mildly tender to palpation along the upper costal margin CV: RRR, no murmurs, normal S1, S2 split Resp: CTAB, mild bibasilar wheezing, no rales or rhonchi, comfortable work of breathing Abd: non-distended, epigastric tender to palpation, soft, +bs in all four quadrant Ext: no clubbing, cyanosis, or edema Skin: warm, dry, intact, no rashes  Assessment/Plan:   Chronic pancreatitis ([place]) Patient having continued bouts of epigastric and abdominal pain but none today. I do wonder if this last episode is due to chronic pancreatitis vs worsened GERD. He is set up to have surgery done soon at [facility]. I think its best to wait, control symptoms, and reevaluate after the procedure. - Oxycodone 5mg  x 30. Last refill was by me over one month ago. - Referral to pain clinic   PATIENT EDUCATION PROVIDED: See AVS    Diagnosis and plan along with any newly prescribed medication(s) were discussed in detail with this patient today. The patient verbalized understanding and agreed with the plan. Patient advised if symptoms worsen return to clinic or ER.   Health Maintainance: At next visit patient to get A1c, urine microalbumin, PNA vaccine, foot exam   Orders Placed This Encounter  Procedures  . Ambulatory referral to Pain Clinic    Referral Priority:   Routine    Referral Type:   Consultation    Referral Reason:   Specialty Services Required    Requested Specialty:   Pain Medicine    Number of Visits Requested:   1    Meds ordered this encounter  Medications  . oxycodone (OXY-IR) 5 MG capsule    Sig: Take 1 capsule (5 mg total) by mouth every 4 (four) hours as needed for up to 7 days.    Dispense:  30 capsule    Refill:  0  Harolyn Rutherford, DO 05/01/2019, 8:37 AM PGY-3 [place]

## 2019-05-07 NOTE — Assessment & Plan Note (Signed)
Patient having continued bouts of epigastric and abdominal pain but none today. I do wonder if this last episode is due to chronic pancreatitis vs worsened GERD. He is set up to have surgery done soon at [facility]. I think its best to wait, control symptoms, and reevaluate after the procedure. - Oxycodone 5mg  x 30. Last refill was by me over one month ago. - Referral to pain clinic

## 2019-05-13 ENCOUNTER — Telehealth: Payer: Self-pay | Admitting: Family Medicine

## 2019-05-13 NOTE — Telephone Encounter (Signed)
Patient called to inform his PCP that he hadn't received a call from pain management and that he is almost out of his pain meds. Please give patient a call.

## 2019-05-14 ENCOUNTER — Other Ambulatory Visit: Payer: Self-pay

## 2019-05-15 ENCOUNTER — Encounter: Payer: Self-pay | Admitting: Gastroenterology

## 2019-05-15 MED ORDER — METOPROLOL TARTRATE 50 MG PO TABS: 75.0000 mg | ORAL_TABLET | Freq: Two times a day (BID) | ORAL | 3 refills | Status: AC

## 2019-05-15 NOTE — Telephone Encounter (Signed)
Pt informed. Thelbert Gartin, CMA  

## 2019-05-17 ENCOUNTER — Other Ambulatory Visit (HOSPITAL_COMMUNITY): Payer: Self-pay | Admitting: Respiratory Therapy

## 2019-05-17 DIAGNOSIS — Z01818 Encounter for other preprocedural examination: Secondary | ICD-10-CM

## 2019-05-27 ENCOUNTER — Telehealth: Payer: Self-pay | Admitting: *Deleted

## 2019-05-27 NOTE — Telephone Encounter (Signed)
Pt states that he still has not heard from pain management.  To PCP and referral coordinator. Christen Bame, CMA

## 2019-06-02 NOTE — Progress Notes (Addendum)
Cardiology Office Note:    Date:  06/03/2019   ID:  Timothy Martinez, DOB 1951/04/16, MRN [id number]  PCP:  [name], DO  Cardiologist:  Mertie Moores, MD   Electrophysiologist:  None   Referring MD: [name], DO   Chief Complaint  Patient presents with  . Follow-up    AFib     History of Present Illness:    Timothy Martinez is a 68 y.o. male with:   Paroxysmal AFib  Chronic diastolic CHF  COPD  Hx of recurrent acute on chronic alcoholic pancreatitis  Hypertension   Hyperlipidemia   Diabetes mellitus  S/p splenectomy   GERD  Timothy Martinez was last seen by Dr. Acie Fredrickson in 05/2018.   He returns for follow-up.  He is here alone.  He continues to have issues with pancreatitis.  He has recently undergone ERCP.  He has been referred to a Psychologist, sport and exercise at [facility].  He is apparently to have some type of abdominal surgery in the near future.  The surgeon at [facility] has already ordered a stress test as well as PFTs.  The patient denies chest discomfort with exertion.  He has chronic exertional shortness of breath.  He continues to smoke cigarettes.  He has not had orthopnea, paroxysmal nocturnal dyspnea or leg swelling.  He has not had syncope.  He does note rare palpitations consistent with atrial fibrillation.  This may occur 3 times a year.  He usually has chest discomfort with this which is relieved by nitroglycerin.  There has been no change in this pattern.  Prior CV studies:   The following studies were reviewed today:  ABIs 03/20/15 +-----------------+---------+--------------+---------+ Location         Pressure Brachial indexWaveform  +-----------------+---------+--------------+---------+ Right ant tibial 150 mm Hg1.18          Triphasic +-----------------+---------+--------------+---------+ Right post tibial140 mm Hg1.1           Triphasic +-----------------+---------+--------------+---------+ Left ant tibial  149 mm Hg1.17          Triphasic  +-----------------+---------+--------------+---------+ Left post tibial 143 mm Hg1.13          Triphasic +-----------------+---------+--------------+---------+ ------------------------------------------------------------------- Summary: Bilateral ABI within normal limits.  Echocardiogram 12/30/14 Mod LVH, EF 55-60, no RWMA, Gr 1 DD, trivial MR, redundant chordal loop noted in LV cavity attached to ant mitral valve leaflet   Past Medical History:  Diagnosis Date  . Acute on chronic pancreatitis ([place]) 05/17/2016  . Alcohol abuse   . Alcoholic pancreatitis   . Anginal pain ([place])   . Anxiety and depression 01/20/2015   PHQ 9 = 11 (01/20/15)    . Arthritis    "eat up w/it" (05/17/2016)  . Atrial fibrillation status post cardioversion ([place]) 08/2014  . Atrial flutter, unspecified   . CHF (congestive heart failure) ([place])   . Chronic anticoagulation   . Chronic atrial fibrillation ([place])   . Chronic bronchitis ([place])   . Chronic left-sided low back pain with sciatica 12/21/2012  . Chronic pancreatitis ([place])   . COLD (chronic obstructive lung disease) ([place])   . Complication of anesthesia    pt C/O confusion after procedure in April 2020  . Daily headache    "need eye examined" (05/17/2016)  . Gastric outlet obstruction 10/2018  . GERD (gastroesophageal reflux disease)   . Hematuria, microscopic 02/13/2015   Noted on UA - Recheck UA at next visit ~ 1 months   . Hyperlipidemia   . Hypertension   . Insomnia 02/11/2016  .  Lateral epicondylitis of right elbow 03/17/2017  . Left foot pain 10/12/2013  . Loss of weight 02/19/2018  . Low back pain 12/21/2012  . Malnutrition of moderate degree 05/18/2016  . Neck pain, bilateral posterior 10/15/2014  . Neuropathy 03/16/2015  . Pain of joint of left ankle and foot 06/13/2018  . Pain of right upper extremity 05/30/2016  . Pancreatic pseudocyst    seen on CT scan 07/2014  . Pancreatitis 08/04/2014  . Permanent atrial fibrillation ([place]) 05/05/2017  .  Pneumonia    "?a few times" (05/17/2016)  . 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 Tobacco abuse 05/30/2011   Heavy smoker up to 3 ppd down to 15 cigs per day in 09/2014   . Type II diabetes mellitus ([place])   . Wears dentures   . Wears glasses    Surgical Hx: The patient  has a past surgical history that includes Back surgery; Neck surgery (1974); Splenectomy (2003); Cardioversion (N/A, 09/22/2014); Posterior laminectomy / decompression lumbar spine (10/2006); Colonoscopy w/ polypectomy (2019); Esophagogastroduodenoscopy (egd) with propofol (N/A, 11/20/2018); Upper esophageal endoscopic ultrasound (eus) (N/A, 11/20/2018); Foreign Body Removal (11/20/2018); biopsy (11/20/2018); Upper esophageal endoscopic ultrasound (eus) (N/A, 02/04/2019); ERCP (N/A, 02/04/2019); Esophagogastroduodenoscopy (egd) with propofol (N/A, 02/04/2019); and biopsy (02/04/2019).   Current Medications: Current Meds  Medication Sig  . acetaminophen (TYLENOL) 325 MG tablet Take 2 tablets (650 mg total) by mouth every 6 (six) hours.  Marland Kitchen amitriptyline (ELAVIL) 10 MG tablet TAKE 1 TABLET BY MOUTH EVERYDAY AT BEDTIME  . atorvastatin (LIPITOR) 40 MG tablet Take 0.5 tablets (20 mg total) by mouth daily at 6 PM.  . Blood Glucose Monitoring Suppl (ONE TOUCH ULTRA 2) w/Device KIT Dispense one glucometer. Diagnosis Type 2 Diabetes. E11.9  . docusate sodium (COLACE) 50 MG capsule Take 1 capsule (50 mg total) by mouth 2 (two) times daily.  . famotidine (PEPCID) 20 MG tablet TAKE 1 TABLET BY MOUTH TWICE A DAY  . feeding supplement, ENSURE ENLIVE, (ENSURE ENLIVE) LIQD Take 237 mLs by mouth 3 (three) times daily between meals.  . gabapentin (NEURONTIN) 100 MG capsule TAKE 1 CAPSULE BY MOUTH THREE TIMES A DAY  . glucose blood (ONE TOUCH ULTRA TEST) test strip Use check fasting blood glucose qAM. Type 2 Diabetes E11.9  . Lancets (FREESTYLE) lancets Use as instructed  . Lancets (ONETOUCH ULTRASOFT) lancets Use as instructed  . lipase/protease/amylase (CREON)  36000 UNITS CPEP capsule Take 2 capsules by mouth 5 (five) times daily.  . magnesium oxide (MAG-OX) 400 MG tablet TAKE 1 TABLET BY MOUTH EVERY DAY  . metFORMIN (GLUCOPHAGE) 1000 MG tablet Take 1 tablet (1,000 mg total) by mouth 2 (two) times daily with a meal.  . metoprolol tartrate (LOPRESSOR) 50 MG tablet Take 1.5 tablets (75 mg total) by mouth 2 (two) times daily.  . mometasone-formoterol (DULERA) 200-5 MCG/ACT AERO Inhale 2 puffs into the lungs 2 (two) times daily.  . Multiple Vitamin (MULTIVITAMIN WITH MINERALS) TABS tablet Take 1 tablet by mouth daily.  . nitroGLYCERIN (NITROSTAT) 0.4 MG SL tablet Place 1 tablet (0.4 mg total) under the tongue every 5 (five) minutes as needed for chest pain.  Marland Kitchen ondansetron (ZOFRAN ODT) 4 MG disintegrating tablet Take 1 tablet (4 mg total) by mouth every 8 (eight) hours as needed for nausea or vomiting.  Marland Kitchen oxyCODONE (OXY IR/ROXICODONE) 5 MG immediate release tablet Take 1 tablet by mouth every 4 (four) hours as needed for pain.  . pantoprazole (PROTONIX) 40 MG tablet Take 1 tablet (40 mg total) by  mouth daily.  . polyethylene glycol (MIRALAX / GLYCOLAX) 17 g packet TAKE 17 G BY MOUTH DAILY.  Marland Kitchen prochlorperazine (COMPAZINE) 10 MG tablet Take 1 tablet (10 mg total) by mouth every 8 (eight) hours as needed for nausea or vomiting.  Alveda Reasons 20 MG TABS tablet TAKE 1 TABLET BY MOUTH DAILY WITH SUPPER     Allergies:   Prozac [fluoxetine hcl], Morphine and related, and Penicillins   Social History   Tobacco Use  . Smoking status: Current Every Day Smoker    Packs/day: 1.00    Years: 52.00    Pack years: 52.00    Types: Cigarettes    Start date: 04/09/1964  . Smokeless tobacco: Never Used  . Tobacco comment: down to one pack daily  Substance Use Topics  . Alcohol use: Not Currently    Comment: QUIT IN 02/2018  . Drug use: No     Family Hx: The patient's family history includes Alcohol abuse in his father; Aneurysm in his mother; Cancer in his father and  sister; Diabetes in his mother; Hypertension in his mother; Post-traumatic stress disorder in his brother; Stroke in his mother. There is no history of Heart attack, Colon cancer, Liver cancer, Rectal cancer, Esophageal cancer, Stomach cancer, or Inflammatory bowel disease.  ROS:   Please see the history of present illness.    ROS All other systems reviewed and are negative.   EKGs/Labs/Other Test Reviewed:    EKG:  EKG is not  ordered today.  The ekg performed on 12/29/2018 was personally reviewed and demonstrated normal sinus rhythm, HR 60, no ST-TW changes, QTc 404, no change from prior tracing   Recent Labs: 11/23/2018: Magnesium 1.4 03/29/2019: ALT 32; BUN 10; Creatinine, Ser 0.69; Hemoglobin 13.6; Platelets 420; Potassium 4.6; Sodium 140   Recent Lipid Panel Lab Results  Component Value Date/Time   CHOL 69 11/18/2018 04:36 PM   TRIG 78 11/18/2018 04:36 PM   HDL 26 (L) 11/18/2018 04:36 PM   CHOLHDL 2.7 11/18/2018 04:36 PM   LDLCALC 27 11/18/2018 04:36 PM    Physical Exam:    VS:  BP 126/72   Pulse 72   Ht '6\' 3"'  (1.905 m)   Wt 161 lb 12.8 oz (73.4 kg)   SpO2 98%   BMI 20.22 kg/m     Wt Readings from Last 3 Encounters:  06/03/19 161 lb 12.8 oz (73.4 kg)  05/01/19 162 lb 9.6 oz (73.8 kg)  03/05/19 166 lb 3.2 oz (75.4 kg)     Physical Exam  Constitutional: He is oriented to person, place, and time. He appears well-developed and well-nourished. No distress.  HENT:  Head: Normocephalic and atraumatic.  Eyes: No scleral icterus.  Neck: No JVD present. No thyromegaly present.  Cardiovascular: Normal rate, regular rhythm and normal heart sounds.  No murmur heard. Pulmonary/Chest: Effort normal and breath sounds normal. He has no rales.  Abdominal: Soft. There is no abdominal tenderness.  Musculoskeletal:        General: No edema.  Lymphadenopathy:    He has no cervical adenopathy.  Neurological: He is alert and oriented to person, place, and time.  Skin: Skin is warm and  dry.  Psychiatric: He has a normal mood and affect.    ASSESSMENT & PLAN:    1. Paroxysmal atrial fibrillation (HCC) He seems to be maintaining normal sinus rhythm.  Electrocardiogram in June 2020 demonstrated normal sinus rhythm.  He seems to be tolerating anticoagulation well.  His hemoglobin and creatinine were  both normal in September 2020.  Continue current dose of rivaroxaban.  2. Chronic diastolic CHF (congestive heart failure) (HCC) Overall, volume status appears stable.  He is currently well controlled without diuretic therapy.  3. Type 2 diabetes mellitus without complication, without long-term current use of insulin ([place]) Continue follow-up with primary care.  4. Essential hypertension The patient's blood pressure is controlled on his current regimen.  Continue current therapy.   5. Chronic alcoholic pancreatitis ([place]) He is apparently to undergo abdominal surgery in the near future at [facility].  We have not received formal request for surgical clearance.  Overall, according to the revised cardiac risk index, his risk of perioperative major cardiac event is elevated at 6.6%.  He remains fairly active and is able to achieve >4 METs.  His surgeon has requested a stress test prior to his procedure.  As long as his stress test is low risk, he should be able to proceed at acceptable risk.  The patient will request his surgeon to be in communication with our office.  We can certainly make recommendations regarding his anticoagulation.  6. Tobacco abuse I have advised cessation.   Dispo:  Return in about 1 year (around 06/02/2020) for Routine Follow Up, w/ Dr. Acie Fredrickson, or Richardson Dopp, PA-C, in person.   Medication Adjustments/Labs and Tests Ordered: Current medicines are reviewed at length with the patient today.  Concerns regarding medicines are outlined above.  Tests Ordered: No orders of the defined types were placed in this encounter.  Medication Changes: No orders of the defined  types were placed in this encounter.   Signed, Richardson Dopp, PA-C  06/03/2019 12:58 PM    [facility] [place], [place], [place]  [address] Phone: [phone]; Fax: [phone]   Addendum 06/11/2019 11:28 AM  Received notes from Jim Like, DNP, RN with the Abdominal Transplant and Surgery Clinic at [facility].  The plan is for the patient to undergo a Frey's or [place] procedure to alleviate pancreatic duct pressure to avoid recurrent pancreatitis (with Dr. Grant Fontana).  A dobutamine echocardiogram has been requested from the surgery clinic.  See note in chart.  Dr. Acie Fredrickson has suggested she have a nuclear stress test instead.  As long as this is low risk, he can proceed with his surgery at acceptable risk.  I have reviewed his history with our PharmD regarding recommendations for holding his Xarelto at the time of his surgery.  He may hold Xarelto 2-3 days prior to his procedure and resume post op as soon as it is safe.   Richardson Dopp, PA-C    06/11/2019 11:33 AM

## 2019-06-03 ENCOUNTER — Ambulatory Visit: Payer: Medicare Other | Admitting: Physician Assistant

## 2019-06-03 ENCOUNTER — Encounter: Payer: Self-pay | Admitting: Physician Assistant

## 2019-06-03 ENCOUNTER — Other Ambulatory Visit (HOSPITAL_COMMUNITY): Payer: Self-pay | Admitting: Respiratory Therapy

## 2019-06-03 ENCOUNTER — Encounter (INDEPENDENT_AMBULATORY_CARE_PROVIDER_SITE_OTHER): Payer: Self-pay

## 2019-06-03 ENCOUNTER — Other Ambulatory Visit: Payer: Self-pay

## 2019-06-03 VITALS — BP 126/72 | HR 72 | Ht 75.0 in | Wt 161.8 lb

## 2019-06-03 DIAGNOSIS — Z01810 Encounter for preprocedural cardiovascular examination: Secondary | ICD-10-CM

## 2019-06-03 DIAGNOSIS — K860 Alcohol-induced chronic pancreatitis: Secondary | ICD-10-CM

## 2019-06-03 DIAGNOSIS — Z720 Tobacco use: Secondary | ICD-10-CM

## 2019-06-03 DIAGNOSIS — I480 Paroxysmal atrial fibrillation: Secondary | ICD-10-CM

## 2019-06-03 DIAGNOSIS — F17200 Nicotine dependence, unspecified, uncomplicated: Secondary | ICD-10-CM

## 2019-06-03 DIAGNOSIS — I5032 Chronic diastolic (congestive) heart failure: Secondary | ICD-10-CM

## 2019-06-03 DIAGNOSIS — I10 Essential (primary) hypertension: Secondary | ICD-10-CM | POA: Diagnosis not present

## 2019-06-03 DIAGNOSIS — E119 Type 2 diabetes mellitus without complications: Secondary | ICD-10-CM

## 2019-06-03 NOTE — Patient Instructions (Signed)
Medication Instructions:   Your physician recommends that you continue on your current medications as directed. Please refer to the Current Medication list given to you today.  *If you need a refill on your cardiac medications before your next appointment, please call your pharmacy*  Lab Work:  None ordered today  Testing/Procedures:  None ordered today  Follow-Up: At [facility], you and your health needs are our priority.  As part of our continuing mission to provide you with exceptional heart care, we have created designated Provider Care Teams.  These Care Teams include your primary Cardiologist (physician) and Advanced Practice Providers (APPs -  Physician Assistants and Nurse Practitioners) who all work together to provide you with the care you need, when you need it.  Your next appointment:   12 months  The format for your next appointment:   In Person  Provider:   You may see Mertie Moores, MD or one of the following Advanced Practice Providers on your designated Care Team:    Richardson Dopp, PA-C  [place], [place]  Daune Perch, [place]

## 2019-06-06 ENCOUNTER — Telehealth: Payer: Self-pay | Admitting: Nurse Practitioner

## 2019-06-06 NOTE — Telephone Encounter (Signed)
Received letter from Jim Like, DNP, Abdominal Transplant and Surgery Coordinator at [facility] regarding surgical clearance for patient. They have requested a dobutamine echo stress for clearance.   I have placed the letter for HIM to fax into patient's chart. I left a message for Leda Gauze that per Dr. Acie Fredrickson, patient would benefit more from an exercise myocardial perfusion study, he does not feel that a stress echo is necessary. I advised her to call back on Monday, 11/16 due to Dr. Acie Fredrickson and I will not be in the office tomorrow.

## 2019-06-07 ENCOUNTER — Other Ambulatory Visit (HOSPITAL_COMMUNITY)
Admission: RE | Admit: 2019-06-07 | Discharge: 2019-06-07 | Disposition: A | Discharge status: Home / Self Care | Payer: Medicare Other | Source: Ambulatory Visit | Attending: Transplant Surgery | Admitting: Transplant Surgery

## 2019-06-07 DIAGNOSIS — Z01812 Encounter for preprocedural laboratory examination: Secondary | ICD-10-CM | POA: Insufficient documentation

## 2019-06-07 DIAGNOSIS — Z20828 Contact with and (suspected) exposure to other viral communicable diseases: Secondary | ICD-10-CM | POA: Diagnosis not present

## 2019-06-07 LAB — SARS CORONAVIRUS 2 (TAT 6-24 HRS): SARS Coronavirus 2: NEGATIVE

## 2019-06-10 ENCOUNTER — Other Ambulatory Visit: Payer: Self-pay

## 2019-06-10 ENCOUNTER — Ambulatory Visit (HOSPITAL_COMMUNITY)
Admission: RE | Admit: 2019-06-10 | Discharge: 2019-06-10 | Disposition: A | Discharge status: Home / Self Care | Payer: Medicare Other | Source: Ambulatory Visit | Attending: Transplant Surgery | Admitting: Transplant Surgery

## 2019-06-10 DIAGNOSIS — Z01818 Encounter for other preprocedural examination: Secondary | ICD-10-CM | POA: Insufficient documentation

## 2019-06-10 DIAGNOSIS — E119 Type 2 diabetes mellitus without complications: Secondary | ICD-10-CM | POA: Diagnosis not present

## 2019-06-10 DIAGNOSIS — I480 Paroxysmal atrial fibrillation: Secondary | ICD-10-CM | POA: Insufficient documentation

## 2019-06-10 DIAGNOSIS — E785 Hyperlipidemia, unspecified: Secondary | ICD-10-CM | POA: Insufficient documentation

## 2019-06-10 DIAGNOSIS — J449 Chronic obstructive pulmonary disease, unspecified: Secondary | ICD-10-CM | POA: Diagnosis not present

## 2019-06-10 DIAGNOSIS — I110 Hypertensive heart disease with heart failure: Secondary | ICD-10-CM | POA: Diagnosis not present

## 2019-06-10 DIAGNOSIS — F17210 Nicotine dependence, cigarettes, uncomplicated: Secondary | ICD-10-CM | POA: Diagnosis not present

## 2019-06-10 DIAGNOSIS — I5032 Chronic diastolic (congestive) heart failure: Secondary | ICD-10-CM | POA: Diagnosis not present

## 2019-06-10 LAB — PULMONARY FUNCTION TEST
DL/VA % pred: 52 %
DL/VA: 2.1 ml/min/mmHg/L
DLCO unc % pred: 43 %
DLCO unc: 13.41 ml/min/mmHg
FEF 25-75 Post: 1.75 L/sec
FEF 25-75 Pre: 1.26 L/sec
FEF2575-%Change-Post: 39 %
FEF2575-%Pred-Post: 56 %
FEF2575-%Pred-Pre: 40 %
FEV1-%Change-Post: 3 %
FEV1-%Pred-Post: 68 %
FEV1-%Pred-Pre: 66 %
FEV1-Post: 2.76 L
FEV1-Pre: 2.67 L
FEV1FVC-%Change-Post: 1 %
FEV1FVC-%Pred-Pre: 82 %
FEV6-%Change-Post: 5 %
FEV6-%Pred-Post: 86 %
FEV6-%Pred-Pre: 81 %
FEV6-Post: 4.44 L
FEV6-Pre: 4.21 L
FEV6FVC-%Change-Post: 3 %
FEV6FVC-%Pred-Post: 105 %
FEV6FVC-%Pred-Pre: 101 %
FVC-%Change-Post: 2 %
FVC-%Pred-Post: 82 %
FVC-%Pred-Pre: 80 %
FVC-Post: 4.44 L
FVC-Pre: 4.35 L
Post FEV1/FVC ratio: 62 %
Post FEV6/FVC ratio: 100 %
Pre FEV1/FVC ratio: 61 %
Pre FEV6/FVC Ratio: 97 %
RV % pred: 130 %
RV: 3.48 L
TLC % pred: 95 %
TLC: 7.64 L

## 2019-06-10 MED ORDER — ALBUTEROL SULFATE (2.5 MG/3ML) 0.083% IN NEBU
2.5000 mg | INHALATION_SOLUTION | Freq: Once | RESPIRATORY_TRACT | Status: AC
  Administered 2019-06-10: 2.5 mg via RESPIRATORY_TRACT

## 2019-06-11 ENCOUNTER — Telehealth: Payer: Self-pay | Admitting: Physician Assistant

## 2019-06-11 NOTE — Telephone Encounter (Signed)
Patient with diagnosis of afib on Xarelto for anticoagulation.    Procedure: abdominal surgery Date of procedure: TBD  CHADS2-VASc score of  4 (CHF, HTN, AGE, DM2)  CrCl 107 ml/min  Per office protocol, patient can hold Xarelto for 2 days prior to procedure. Patient would be ok to hold 3 days if that is what surgeon requests.

## 2019-06-11 NOTE — Telephone Encounter (Signed)
Please see my recent office note. He is to have a procedure at [facility] on his pancreas to reduce the recurrence of pancreatitis.  It sounds like he plans to do this in January.   Please provide recommendations regarding holding his Xarelto at the time of surgery.  The surgeon has not provided [place] with how long they want to hold anticoagulation prior to the procedure.   Ok to route back to me rather than preop pool. Richardson Dopp, PA-C    06/11/2019 11:38 AM

## 2019-06-12 ENCOUNTER — Ambulatory Visit (INDEPENDENT_AMBULATORY_CARE_PROVIDER_SITE_OTHER): Payer: Medicare Other | Admitting: Family Medicine

## 2019-06-12 ENCOUNTER — Other Ambulatory Visit: Payer: Self-pay

## 2019-06-12 VITALS — BP 115/80 | HR 87 | Wt 164.0 lb

## 2019-06-12 DIAGNOSIS — E119 Type 2 diabetes mellitus without complications: Secondary | ICD-10-CM | POA: Diagnosis not present

## 2019-06-12 DIAGNOSIS — R350 Frequency of micturition: Secondary | ICD-10-CM | POA: Insufficient documentation

## 2019-06-12 LAB — POCT URINALYSIS DIP (MANUAL ENTRY)
Blood, UA: NEGATIVE
Glucose, UA: NEGATIVE mg/dL
Leukocytes, UA: NEGATIVE
Nitrite, UA: NEGATIVE
Protein Ur, POC: 100 mg/dL — AB
Spec Grav, UA: 1.025 (ref 1.010–1.025)
Urobilinogen, UA: 0.2 E.U./dL
pH, UA: 6 (ref 5.0–8.0)

## 2019-06-12 LAB — POCT GLYCOSYLATED HEMOGLOBIN (HGB A1C): HbA1c, POC (controlled diabetic range): 6.1 % (ref 0.0–7.0)

## 2019-06-12 LAB — GLUCOSE, POCT (MANUAL RESULT ENTRY): POC Glucose: 123 mg/dl — AB (ref 70–99)

## 2019-06-12 MED ORDER — TAMSULOSIN HCL 0.4 MG PO CAPS: 0.4000 mg | ORAL_CAPSULE | Freq: Every day | ORAL | 3 refills | Status: AC

## 2019-06-12 MED ORDER — PNEUMOVAX 23 25 MCG/0.5ML IJ INJ: 0.5000 mL | INJECTION | INTRAMUSCULAR | 0 refills | Status: DC

## 2019-06-12 MED ORDER — PNEUMOVAX 23 25 MCG/0.5ML IJ INJ: 0.5000 mL | INJECTION | INTRAMUSCULAR | 0 refills | Status: AC

## 2019-06-12 NOTE — Assessment & Plan Note (Signed)
BPH vs Prostate cancer vs UTI vs polyuria from uncontrolled DM all considered. DM is well controlled confirmed with A1c testing today along with POCT glucose of 123. UTI is unlikely given his symptoms but will check. Most likely BPH but will obtain PSA as he declines prostate exam. - F/u PSA if abnormal - Start flomax 0.4mg  daily - F/u UA and Urine culture - F/u in 4 weeks

## 2019-06-12 NOTE — Patient Instructions (Signed)
It was great to see you today! Thank you for letting me participate in your care!  Today, we discussed your urinary frequency and I have gotten several labs to test for urinary tract infection and also a lab that looks at if you have anything going on with your prostate.   If anything is abnormal I will call you.  Be well, Harolyn Rutherford, DO PGY-3, [facility]

## 2019-06-12 NOTE — Addendum Note (Signed)
Addended by: Ezzard Flax on: 06/12/2019 02:27 PM   Modules accepted: Orders

## 2019-06-12 NOTE — Progress Notes (Signed)
Subjective: Chief Complaint  Patient presents with  . Polyuria    at night    HPI: Timothy Martinez is a 68 y.o. presenting to clinic today to discuss the following:  Frequent Urination Patient has new onset of urinary frequency where he is waking up at night time 3-4 times per night to urinate that began about 2-3 weeks ago. It is not painful and does not burn. He has no urgency either. He has no low back pain. He endorses no fever, chills, nausea, vomiting, or abdominal pain. He has never had this before. He does have a weak stream and sometimes struggles to fill like he has finished voiding. Patient also endorses damage to the tip of the penis while zipping up his pants. He has been using neosporin and has  A well healing scar. No drainage, redness, swelling, or pain today of the penis.  Health Maintenance: A1c, urine microalbumin, foot exam, PNA23 prescription given     ROS noted in HPI.    Social History   Tobacco Use  Smoking Status Current Every Day Smoker  . Packs/day: 1.00  . Years: 52.00  . Pack years: 52.00  . Types: Cigarettes  . Start date: 04/09/1964  Smokeless Tobacco Never Used  Tobacco Comment   down to one pack daily    Objective: BP 115/80   Pulse 87   Wt 164 lb (74.4 kg)   SpO2 98%   BMI 20.50 kg/m  Vitals and nursing notes reviewed  Physical Exam Gen: Alert and Oriented x 3, NAD GU: normal penis with well healing scab located on the head of the penis Ext: no clubbing, cyanosis, or edema; normal diabetic foot exam Skin: warm, dry, intact, no rashes   Results for orders placed or performed in visit on 06/12/19 (from the past 72 hour(s))  HgB A1c     Status: None   Collection Time: 06/12/19  1:49 PM  Result Value Ref Range   Hemoglobin A1C     HbA1c POC (<> result, manual entry)     HbA1c, POC (prediabetic range)     HbA1c, POC (controlled diabetic range) 6.1 0.0 - 7.0 %  Glucose (CBG)     Status: Abnormal   Collection Time: 06/12/19   1:49 PM  Result Value Ref Range   POC Glucose 123 (A) 70 - 99 mg/dl    Assessment/Plan:  Urinary frequency BPH vs Prostate cancer vs UTI vs polyuria from uncontrolled DM all considered. DM is well controlled confirmed with A1c testing today along with POCT glucose of 123. UTI is unlikely given his symptoms but will check. Most likely BPH but will obtain PSA as he declines prostate exam. - F/u PSA if abnormal - Start flomax 0.4mg  daily - F/u UA and Urine culture - F/u in 4 weeks   PATIENT EDUCATION PROVIDED: See AVS    Diagnosis and plan along with any newly prescribed medication(s) were discussed in detail with this patient today. The patient verbalized understanding and agreed with the plan. Patient advised if symptoms worsen return to clinic or ER.     Orders Placed This Encounter  Procedures  . Urine culture  . PSA, total and free  . Microalbumin/Creatinine Ratio, Urine  . Urinalysis, Routine w reflex microscopic  . HgB A1c  . Glucose (CBG)    Meds ordered this encounter  Medications  . tamsulosin (FLOMAX) 0.4 MG CAPS capsule    Sig: Take 1 capsule (0.4 mg total) by mouth daily.  Dispense:  30 capsule    Refill:  3  . DISCONTD: pneumococcal 23 valent vaccine (PNEUMOVAX 23) 25 MCG/0.5ML injection    Sig: Inject 0.5 mLs into the muscle tomorrow at 10 am for 1 dose.    Dispense:  0.5 mL    Refill:  0  . pneumococcal 23 valent vaccine (PNEUMOVAX 23) 25 MCG/0.5ML injection    Sig: Inject 0.5 mLs into the muscle tomorrow at 10 am for 1 dose.    Dispense:  0.5 mL    Refill:  0     Harolyn Rutherford, DO 06/12/2019, 1:42 PM PGY-3 [place]

## 2019-06-13 LAB — PSA, TOTAL AND FREE
PSA, Free Pct: 12.9 %
PSA, Free: 0.31 ng/mL
Prostate Specific Ag, Serum: 2.4 ng/mL (ref 0.0–4.0)

## 2019-06-13 LAB — SPECIMEN STATUS REPORT

## 2019-06-14 ENCOUNTER — Encounter: Payer: Self-pay | Admitting: Nurse Practitioner

## 2019-06-14 LAB — URINALYSIS, ROUTINE W REFLEX MICROSCOPIC
Bilirubin, UA: NEGATIVE
Glucose, UA: NEGATIVE
Nitrite, UA: NEGATIVE
RBC, UA: NEGATIVE
Specific Gravity, UA: >=1.03 — AB (ref 1.005–1.030)
Urobilinogen, Ur: 1 mg/dL (ref 0.2–1.0)
pH, UA: 5 (ref 5.0–7.5)

## 2019-06-14 LAB — MICROSCOPIC EXAMINATION: Casts: NONE SEEN /lpf

## 2019-06-14 LAB — URINE CULTURE: Organism ID, Bacteria: NO GROWTH

## 2019-06-14 LAB — MICROALBUMIN / CREATININE URINE RATIO
Creatinine, Urine: 203.9 mg/dL
Microalb/Creat Ratio: 71 mg/g creat — ABNORMAL HIGH (ref 0–29)
Microalbumin, Urine: 144.1 ug/mL

## 2019-06-14 NOTE — Telephone Encounter (Signed)
Letter faxed to [facility] Transplant team stating patient needs exercise myoview for cardiac clearance.

## 2019-06-17 ENCOUNTER — Telehealth: Payer: Self-pay | Admitting: Gastroenterology

## 2019-06-17 NOTE — Telephone Encounter (Signed)
Error

## 2019-06-25 ENCOUNTER — Ambulatory Visit: Payer: Medicare Other | Admitting: Family Medicine

## 2019-06-27 ENCOUNTER — Other Ambulatory Visit: Payer: Self-pay | Admitting: Family Medicine

## 2019-06-27 DIAGNOSIS — R809 Proteinuria, unspecified: Secondary | ICD-10-CM

## 2019-06-27 NOTE — Progress Notes (Signed)
Patient had moderate new onset microalbuminuria. He does NOT have hypertension. He is on Metop due to his HFrEF. He was also taking it for his A Fib but he was successfully cardioverted. Will repeat since he had urinary symptoms on day of test.  He is diabetic and may benefit from SGLT-2 inhibitor especially if his microalbuminuria increase to 200 or greater.

## 2019-06-28 ENCOUNTER — Emergency Department (HOSPITAL_COMMUNITY)
Admission: EM | Admit: 2019-06-28 | Discharge: 2019-06-28 | Disposition: A | Discharge status: Home / Self Care | Payer: Medicare Other | Attending: Emergency Medicine | Admitting: Emergency Medicine

## 2019-06-28 ENCOUNTER — Encounter (HOSPITAL_COMMUNITY): Payer: Self-pay | Admitting: Emergency Medicine

## 2019-06-28 ENCOUNTER — Other Ambulatory Visit: Payer: Self-pay

## 2019-06-28 DIAGNOSIS — R1032 Left lower quadrant pain: Secondary | ICD-10-CM | POA: Insufficient documentation

## 2019-06-28 DIAGNOSIS — Z5320 Procedure and treatment not carried out because of patient's decision for unspecified reasons: Secondary | ICD-10-CM | POA: Insufficient documentation

## 2019-06-28 DIAGNOSIS — R079 Chest pain, unspecified: Secondary | ICD-10-CM | POA: Insufficient documentation

## 2019-06-28 LAB — COMPREHENSIVE METABOLIC PANEL
ALT: 29 U/L (ref 0–44)
AST: 58 U/L — ABNORMAL HIGH (ref 15–41)
Albumin: 2.8 g/dL — ABNORMAL LOW (ref 3.5–5.0)
Alkaline Phosphatase: 221 U/L — ABNORMAL HIGH (ref 38–126)
Anion gap: 10 (ref 5–15)
BUN: 13 mg/dL (ref 8–23)
CO2: 26 mmol/L (ref 22–32)
Calcium: 9.1 mg/dL (ref 8.9–10.3)
Chloride: 104 mmol/L (ref 98–111)
Creatinine, Ser: 0.76 mg/dL (ref 0.61–1.24)
GFR calc Af Amer: >60 mL/min (ref >60)
GFR calc non Af Amer: >60 mL/min (ref >60)
Glucose, Bld: 138 mg/dL — ABNORMAL HIGH (ref 70–99)
Potassium: 4.3 mmol/L (ref 3.5–5.1)
Sodium: 140 mmol/L (ref 135–145)
Total Bilirubin: 0.6 mg/dL (ref 0.3–1.2)
Total Protein: 6.9 g/dL (ref 6.5–8.1)

## 2019-06-28 LAB — CBC
HCT: 36.1 % — ABNORMAL LOW (ref 39.0–52.0)
Hemoglobin: 11.2 g/dL — ABNORMAL LOW (ref 13.0–17.0)
MCH: 29.2 pg (ref 26.0–34.0)
MCHC: 31 g/dL (ref 30.0–36.0)
MCV: 94.3 fL (ref 80.0–100.0)
Platelets: 511 10*3/uL — ABNORMAL HIGH (ref 150–400)
RBC: 3.83 MIL/uL — ABNORMAL LOW (ref 4.22–5.81)
RDW: 15.6 % — ABNORMAL HIGH (ref 11.5–15.5)
WBC: 14.4 10*3/uL — ABNORMAL HIGH (ref 4.0–10.5)
nRBC: 0 % (ref 0.0–0.2)

## 2019-06-28 LAB — LIPASE, BLOOD: Lipase: 119 U/L — ABNORMAL HIGH (ref 11–51)

## 2019-06-28 MED ORDER — SODIUM CHLORIDE 0.9% FLUSH: 3.0000 mL | Freq: Once | INTRAVENOUS | Status: DC

## 2019-06-28 NOTE — ED Triage Notes (Signed)
Pt reports 2 days LLAbdomen pain radiaiting to his chest. HX of afib. Rating pain 10/10. Hx of pancreatitis.

## 2019-07-05 ENCOUNTER — Other Ambulatory Visit: Payer: Self-pay

## 2019-07-05 ENCOUNTER — Encounter: Payer: Self-pay | Admitting: Family Medicine

## 2019-07-05 ENCOUNTER — Ambulatory Visit (INDEPENDENT_AMBULATORY_CARE_PROVIDER_SITE_OTHER): Payer: Medicare Other | Admitting: Family Medicine

## 2019-07-05 VITALS — BP 110/68 | HR 84 | Wt 159.0 lb

## 2019-07-05 DIAGNOSIS — G8929 Other chronic pain: Secondary | ICD-10-CM | POA: Diagnosis not present

## 2019-07-05 DIAGNOSIS — M25512 Pain in left shoulder: Secondary | ICD-10-CM | POA: Diagnosis not present

## 2019-07-05 DIAGNOSIS — R1032 Left lower quadrant pain: Secondary | ICD-10-CM | POA: Diagnosis not present

## 2019-07-05 MED ORDER — POLYETHYLENE GLYCOL 3350 17 GM/SCOOP PO POWD: 17.0000 g | Freq: Every day | ORAL | 1 refills | Status: DC

## 2019-07-05 NOTE — Assessment & Plan Note (Signed)
Pain of left shoulder has returned soon after subacromial steroid injection. Presentation and position of discomfort does not fit any particular pattern. Given no trauma or injury doubt fracture or dislocation. Could be frozen shoulder but no cause makes it unlikely to me. - Referral to [facility] for ultrasound of left shoulder

## 2019-07-05 NOTE — Progress Notes (Signed)
Subjective: Chief Complaint  Patient presents with  . Abdominal Cramping  . Arm Pain   HPI: Timothy Martinez is a 68 y.o. presenting to clinic today to discuss the following:  Abdominal Pain Patient presents today for a follow up visit to the ED for lower left quadrant that was intermittent, started at random, lasted several hours, and responded to Naproxen and Hydrocodone. He states it does not feel like the pain from his chronic pancreatitis and he did feel a hard mass on his left side that went away with the pain. He is having bowel movements once every 3 days, states they are not hard, sometimes strains, and has had no blood in the stool. He had no associated fever, chills, nausea or vomiting. No chest pain. He is able to eat and drink without difficulty although his appetite is down.  Left Shoulder Pain Patient continues to have pain in his left shoulder that gets better with internal rotation, adduction, and flexion. He states anytime he lets his arm hang down "it hurts worse". He wants to keep it in a sling. He doesn't feel like the steroid shot helped him much at all.   ROS noted in HPI.    Social History   Tobacco Use  Smoking Status Current Every Day Smoker  . Packs/day: 1.00  . Years: 52.00  . Pack years: 52.00  . Types: Cigarettes  . Start date: 04/09/1964  Smokeless Tobacco Never Used  Tobacco Comment   down to one pack daily   Objective: Pulse 84   Wt 159 lb (72.1 kg)   SpO2 96%   BMI 19.87 kg/m  Vitals and nursing notes reviewed  Physical Exam Gen: Alert and Oriented x 3, NAD CV: RRR, no murmurs, normal S1, S2 split Resp: CTAB, no wheezing, rales, or rhonchi, comfortable work of breathing Abd: non-distended, non-tender, soft, +bs in all four quadrants Ext: no clubbing, cyanosis, or edema Skin: warm, dry, intact, no rashes  Assessment/Plan:  Abdominal pain Differential includes acute on chronic pancreatitis vs constipation secondary to opiate use  vs diverticulitis. Favor constipation given symptoms and history. Labs from ED visit reviewed and although slighlty elevated lipase with elevated ALT and AST I don't think its acute on chronic pancreatitis as he has no nausea or vomiting and his pain has subsided on its own. WBC slightly elevated but no fever, chills, resolution of abdominal pain and otherwise tolerating food makes diverticulitis unlikely - Miralax one packet daily and increase by one packet per day until he is having one-two stools per day  Left shoulder pain Pain of left shoulder has returned soon after subacromial steroid injection. Presentation and position of discomfort does not fit any particular pattern. Given no trauma or injury doubt fracture or dislocation. Could be frozen shoulder but no cause makes it unlikely to me. - Referral to [facility] for ultrasound of left shoulder   PATIENT EDUCATION PROVIDED: See AVS    Diagnosis and plan along with any newly prescribed medication(s) were discussed in detail with this patient today. The patient verbalized understanding and agreed with the plan. Patient advised if symptoms worsen return to clinic or ER.    Orders Placed This Encounter  Procedures  . Ambulatory referral to Sports Medicine    Referral Priority:   Urgent    Referral Type:   Consultation    Number of Visits Requested:   1    Meds ordered this encounter  Medications  . polyethylene glycol powder (  GLYCOLAX/MIRALAX) 17 GM/SCOOP powder    Sig: Take 17 g by mouth daily.    Dispense:  500 g    Refill:  [place], DO 07/05/2019, 1:37 PM PGY-3 [place]

## 2019-07-05 NOTE — Assessment & Plan Note (Signed)
Differential includes acute on chronic pancreatitis vs constipation secondary to opiate use vs diverticulitis. Favor constipation given symptoms and history. Labs from ED visit reviewed and although slighlty elevated lipase with elevated ALT and AST I don't think its acute on chronic pancreatitis as he has no nausea or vomiting and his pain has subsided on its own. WBC slightly elevated but no fever, chills, resolution of abdominal pain and otherwise tolerating food makes diverticulitis unlikely - Miralax one packet daily and increase by one packet per day until he is having one-two stools per day

## 2019-07-05 NOTE — Patient Instructions (Signed)
It was great to see you today! Thank you for letting me participate in your care!  Today, we discussed your lower left abdominal pain that I think is due to constipation induced by opioid use. Please continue to use your opioids sparingly. I have sent in a prescription for Miralax. Please use one packet daily and increase until you are having normal stools once-twice per day.   Constipation, Adult Constipation is when a person:  Poops (has a bowel movement) fewer times in a week than normal.  Has a hard time pooping.  Has poop that is dry, hard, or bigger than normal. Follow these instructions at home: Eating and drinking   Eat foods that have a lot of fiber, such as: ? Fresh fruits and vegetables. ? Whole grains. ? Beans.  Eat less of foods that are high in fat, low in fiber, or overly processed, such as: ? [place] fries. ? Hamburgers. ? Cookies. ? Candy. ? Soda.  Drink enough fluid to keep your pee (urine) clear or pale yellow. General instructions  Exercise regularly or as told by your doctor.  Go to the restroom when you feel like you need to poop. Do not hold it in.  Take over-the-counter and prescription medicines only as told by your doctor. These include any fiber supplements.  Do pelvic floor retraining exercises, such as: ? Doing deep breathing while relaxing your lower belly (abdomen). ? Relaxing your pelvic floor while pooping.  Watch your condition for any changes.  Keep all follow-up visits as told by your doctor. This is important. Contact a doctor if:  You have pain that gets worse.  You have a fever.  You have not pooped for 4 days.  You throw up (vomit).  You are not hungry.  You lose weight.  You are bleeding from the anus.  You have thin, pencil-like poop (stool). Get help right away if:  You have a fever, and your symptoms suddenly get worse.  You leak poop or have blood in your poop.  Your belly feels hard or bigger than normal  (is bloated).  You have very bad belly pain.  You feel dizzy or you faint. This information is not intended to replace advice given to you by your health care provider. Make sure you discuss any questions you have with your health care provider. Document Released: 12/28/2007 Document Revised: 06/23/2017 Document Reviewed: 12/30/2015 Elsevier Patient Education  2020 [place] [employer], Harolyn Rutherford, DO PGY-3, [facility]

## 2019-07-09 ENCOUNTER — Other Ambulatory Visit (HOSPITAL_COMMUNITY): Payer: Self-pay | Admitting: Transplant Surgery

## 2019-07-09 DIAGNOSIS — Z01810 Encounter for preprocedural cardiovascular examination: Secondary | ICD-10-CM

## 2019-07-09 DIAGNOSIS — K861 Other chronic pancreatitis: Secondary | ICD-10-CM

## 2019-07-09 DIAGNOSIS — Z7901 Long term (current) use of anticoagulants: Secondary | ICD-10-CM

## 2019-07-09 DIAGNOSIS — I480 Paroxysmal atrial fibrillation: Secondary | ICD-10-CM

## 2019-07-10 ENCOUNTER — Telehealth (HOSPITAL_COMMUNITY): Payer: Self-pay | Admitting: *Deleted

## 2019-07-10 NOTE — Telephone Encounter (Signed)
Patient given detailed instructions per Myocardial Perfusion Study Information Sheet for the test on 07/15/19 at 7:30. Patient notified to arrive 15 minutes early and that it is imperative to arrive on time for appointment to keep from having the test rescheduled.  If you need to cancel or reschedule your appointment, please call the office within 24 hours of your appointment. . Patient verbalized understanding.Timothy Martinez

## 2019-07-12 ENCOUNTER — Encounter: Payer: Self-pay | Admitting: Sports Medicine

## 2019-07-12 ENCOUNTER — Ambulatory Visit (INDEPENDENT_AMBULATORY_CARE_PROVIDER_SITE_OTHER): Payer: Medicare Other | Admitting: Sports Medicine

## 2019-07-12 ENCOUNTER — Other Ambulatory Visit: Payer: Self-pay

## 2019-07-12 VITALS — BP 124/72 | Ht 75.0 in | Wt 159.0 lb

## 2019-07-12 DIAGNOSIS — M5010 Cervical disc disorder with radiculopathy, unspecified cervical region: Secondary | ICD-10-CM | POA: Diagnosis not present

## 2019-07-12 MED ORDER — PREDNISONE 20 MG PO TABS: ORAL_TABLET | ORAL | 0 refills | Status: DC

## 2019-07-12 NOTE — Progress Notes (Signed)
[facility] [address] [place], [place] [address] Phone: [phone] Fax: [phone]   Patient Name: Timothy Martinez Date of Birth: 03/16/1951 Medical Record Number: [id number] Gender: male Date of Encounter: 07/12/2019  CC: Left shoulder pain  HPI: Pt is here c/o left shoulder pain. Pain started 3 months ago. No MOI or inciting event. Aggravating factors include extending her arm backwards, lifting arm overhead, and cutting wood. Alleviating factors include "having my arm in the position it would be and if I were in a sling". No radiating symptoms. Describes the pain as sharp and stinging.. No hx of trauma or injury to area in the past.  He has chronic pancreatitis and is currently taking hydrocodone for this.  He is also taking above the recommended dose of daily Aleve.  He had a subacromial bursitis corticosteroid injection by his PCP about 1 month ago that gave him 0 days of relief.  Normally helps his brother shop avoid, and has been unable to do that secondary to the pain.  Will have numbness and tingling that starts in the neck and goes down to the elbow.  Past Medical History:  Diagnosis Date  . Acute on chronic pancreatitis ([place]) 05/17/2016  . Alcohol abuse   . Alcoholic pancreatitis   . Anginal pain ([place])   . Anxiety and depression 01/20/2015   PHQ 9 = 11 (01/20/15)    . Arthritis    "eat up w/it" (05/17/2016)  . Atrial fibrillation status post cardioversion ([place]) 08/2014  . Atrial flutter, unspecified   . CHF (congestive heart failure) ([place])   . Chronic anticoagulation   . Chronic atrial fibrillation ([place])   . Chronic bronchitis ([place])   . Chronic left-sided low back pain with sciatica 12/21/2012  . Chronic pancreatitis ([place])   . COLD (chronic obstructive lung disease) ([place])   . Complication of anesthesia    pt C/O confusion after procedure in April 2020  . Daily headache    "need eye examined" (05/17/2016)  . Gastric outlet  obstruction 10/2018  . GERD (gastroesophageal reflux disease)   . Hematuria, microscopic 02/13/2015   Noted on UA - Recheck UA at next visit ~ 1 months   . Hyperlipidemia   . Hypertension   . Insomnia 02/11/2016  . Lateral epicondylitis of right elbow 03/17/2017  . Left foot pain 10/12/2013  . Loss of weight 02/19/2018  . Low back pain 12/21/2012  . Malnutrition of moderate degree 05/18/2016  . Neck pain, bilateral posterior 10/15/2014  . Neuropathy 03/16/2015  . Pain of joint of left ankle and foot 06/13/2018  . Pain of right upper extremity 05/30/2016  . Pancreatic pseudocyst    seen on CT scan 07/2014  . Pancreatitis 08/04/2014  . Permanent atrial fibrillation ([place]) 05/05/2017  . Pneumonia    "?a few times" (05/17/2016)  . 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 Tobacco abuse 05/30/2011   Heavy smoker up to 3 ppd down to 15 cigs per day in 09/2014   . Type II diabetes mellitus ([place])   . Wears dentures   . Wears glasses     Current Outpatient Medications on File Prior to Visit  Medication Sig Dispense Refill  . acetaminophen (TYLENOL) 325 MG tablet Take 2 tablets (650 mg total) by mouth every 6 (six) hours.    Marland Kitchen amitriptyline (ELAVIL) 10 MG tablet TAKE 1 TABLET BY MOUTH EVERYDAY AT BEDTIME 90 tablet 1  . atorvastatin (LIPITOR) 40 MG tablet Take 0.5 tablets (20 mg total)  by mouth daily at 6 PM. 90 tablet 3  . Blood Glucose Monitoring Suppl (ONE TOUCH ULTRA 2) w/Device KIT Dispense one glucometer. Diagnosis Type 2 Diabetes. E11.9 1 each 0  . docusate sodium (COLACE) 50 MG capsule Take 1 capsule (50 mg total) by mouth 2 (two) times daily. 30 capsule 0  . famotidine (PEPCID) 20 MG tablet TAKE 1 TABLET BY MOUTH TWICE A DAY 30 tablet 3  . feeding supplement, ENSURE ENLIVE, (ENSURE ENLIVE) LIQD Take 237 mLs by mouth 3 (three) times daily between meals. 237 mL 12  . gabapentin (NEURONTIN) 100 MG capsule TAKE 1 CAPSULE BY MOUTH THREE TIMES A DAY 270 capsule 2  . glucose blood (ONE TOUCH ULTRA TEST) test strip  Use check fasting blood glucose qAM. Type 2 Diabetes E11.9 100 each 3  . HYDROcodone-acetaminophen (NORCO/VICODIN) 5-325 MG tablet Take 1 tablet by mouth every 6 (six) hours as needed.    . Lancets (FREESTYLE) lancets Use as instructed 100 each 12  . Lancets (ONETOUCH ULTRASOFT) lancets Use as instructed 100 each 12  . lipase/protease/amylase (CREON) 36000 UNITS CPEP capsule Take 2 capsules by mouth 5 (five) times daily.    . magnesium oxide (MAG-OX) 400 MG tablet TAKE 1 TABLET BY MOUTH EVERY DAY 60 tablet 0  . metFORMIN (GLUCOPHAGE) 1000 MG tablet Take 1 tablet (1,000 mg total) by mouth 2 (two) times daily with a meal. 180 tablet 3  . metoprolol tartrate (LOPRESSOR) 50 MG tablet Take 1.5 tablets (75 mg total) by mouth 2 (two) times daily. 270 tablet 3  . mometasone-formoterol (DULERA) 200-5 MCG/ACT AERO Inhale 2 puffs into the lungs 2 (two) times daily. 3 Inhaler 1  . Multiple Vitamin (MULTIVITAMIN WITH MINERALS) TABS tablet Take 1 tablet by mouth daily.    . nitroGLYCERIN (NITROSTAT) 0.4 MG SL tablet Place 1 tablet (0.4 mg total) under the tongue every 5 (five) minutes as needed for chest pain. 100 tablet 3  . ondansetron (ZOFRAN ODT) 4 MG disintegrating tablet Take 1 tablet (4 mg total) by mouth every 8 (eight) hours as needed for nausea or vomiting. 10 tablet 0  . pantoprazole (PROTONIX) 40 MG tablet Take 1 tablet (40 mg total) by mouth daily. 90 tablet 3  . polyethylene glycol (MIRALAX / GLYCOLAX) 17 g packet TAKE 17 G BY MOUTH DAILY. 14 packet 0  . polyethylene glycol powder (GLYCOLAX/MIRALAX) 17 GM/SCOOP powder Take 17 g by mouth daily. 500 g 1  . prochlorperazine (COMPAZINE) 10 MG tablet Take 1 tablet (10 mg total) by mouth every 8 (eight) hours as needed for nausea or vomiting. 30 tablet 2  . tamsulosin (FLOMAX) 0.4 MG CAPS capsule Take 1 capsule (0.4 mg total) by mouth daily. 30 capsule 3  . XARELTO 20 MG TABS tablet TAKE 1 TABLET BY MOUTH DAILY WITH SUPPER 90 tablet 1  . [DISCONTINUED]  famotidine (PEPCID) 20 MG tablet Take 1 tablet (20 mg total) by mouth 2 (two) times daily. 30 tablet 3   No current facility-administered medications on file prior to visit.    Past Surgical History:  Procedure Laterality Date  . BACK SURGERY    . BIOPSY  11/20/2018   Procedure: BIOPSY;  Surgeon: Rush Landmark Telford Nab., MD;  Location: [facility];  Service: Gastroenterology;;  . BIOPSY  02/04/2019   Procedure: BIOPSY;  Surgeon: Irving Copas., MD;  Location: [facility];  Service: Gastroenterology;;  . CARDIOVERSION N/A 09/22/2014   Procedure: CARDIOVERSION;  Surgeon: Thayer Headings, MD;  Location: [facility];  Service: Cardiovascular;  Laterality: N/A;  . COLONOSCOPY W/ POLYPECTOMY  2019  . ERCP N/A 02/04/2019   Procedure: ENDOSCOPIC RETROGRADE CHOLANGIOPANCREATOGRAPHY (ERCP);  Surgeon: Irving Copas., MD;  Location: [facility];  Service: Gastroenterology;  Laterality: N/A;  . ESOPHAGOGASTRODUODENOSCOPY (EGD) WITH PROPOFOL N/A 11/20/2018   Procedure: ESOPHAGOGASTRODUODENOSCOPY (EGD) WITH PROPOFOL;  Surgeon: Rush Landmark Telford Nab., MD;  Location: [facility];  Service: Gastroenterology;  Laterality: N/A;  . ESOPHAGOGASTRODUODENOSCOPY (EGD) WITH PROPOFOL N/A 02/04/2019   Procedure: ESOPHAGOGASTRODUODENOSCOPY (EGD) WITH PROPOFOL;  Surgeon: Rush Landmark Telford Nab., MD;  Location: [facility];  Service: Gastroenterology;  Laterality: N/A;  . FOREIGN BODY REMOVAL  11/20/2018   Procedure: FOREIGN BODY REMOVAL;  Surgeon: Rush Landmark Telford Nab., MD;  Location: [facility];  Service: Gastroenterology;;  . [place]   not cervical, states had lesion on neck which was removed  . POSTERIOR LAMINECTOMY / DECOMPRESSION LUMBAR SPINE  10/2006   of L4, L5 and S1 with a 5-1 diskectomy, microdissection with the microscope/notes 10/24/2006  . SPLENECTOMY  2003  . UPPER ESOPHAGEAL ENDOSCOPIC ULTRASOUND (EUS) N/A 11/20/2018   Procedure: UPPER ESOPHAGEAL ENDOSCOPIC ULTRASOUND  (EUS);  Surgeon: Irving Copas., MD;  Location: [facility];  Service: Gastroenterology;  Laterality: N/A;  . UPPER ESOPHAGEAL ENDOSCOPIC ULTRASOUND (EUS) N/A 02/04/2019   Procedure: UPPER ESOPHAGEAL ENDOSCOPIC ULTRASOUND (EUS);  Surgeon: Irving Copas., MD;  Location: [facility];  Service: Gastroenterology;  Laterality: N/A;    Allergies  Allergen Reactions  . Prozac [Fluoxetine Hcl] Nausea Only    Made the patient very sick to his stomach  . Morphine And Related Swelling    Pt states he can tolerate oxycodone and hydromorphone  . Penicillins Rash    Has patient had a PCN reaction causing immediate rash, facial/tongue/throat swelling, SOB or lightheadedness with hypotension: Yes Has patient had a PCN reaction causing severe rash involving mucus membranes or skin necrosis: No Has patient had a PCN reaction that required hospitalization: No Has patient had a PCN reaction occurring within the last 10 years: No If all of the above answers are "NO", then may proceed with Cephalosporin use.     Social History   Socioeconomic History  . Marital status: Single    Spouse name: Not on file  . Number of children: 2  . Years of education: Not on file  . Highest education level: Not on file  Occupational History  . Occupation: retired  Tobacco Use  . Smoking status: Current Every Day Smoker    Packs/day: 0.50    Years: 52.00    Pack years: 26.00    Types: Cigarettes    Start date: 04/09/1964  . Smokeless tobacco: Never Used  . Tobacco comment: down to one pack daily  Substance and Sexual Activity  . Alcohol use: Not Currently    Comment: QUIT IN 02/2018  . Drug use: No  . Sexual activity: Not Currently    Birth control/protection: Condom  Other Topics Concern  . 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     2 children one is Radio producer in transplant center   Single or divorced    Admits to alcohol abuse in past- no longer drinking- last drink 02/2018    Smoker   No drugs   Social Determinants of Health   Financial Resource Strain:   . Difficulty of Paying Living Expenses: Not on file  Food Insecurity:   .  Worried About Charity fundraiser in the Last Year: Not on file  . Ran Out of Food in the Last Year: Not on file  Transportation Needs:   . Lack of Transportation (Medical): Not on file  . Lack of Transportation (Non-Medical): Not on file  Physical Activity:   . Days of Exercise per Week: Not on file  . Minutes of Exercise per Session: Not on file  Stress:   . Feeling of Stress : Not on file  Social Connections:   . Frequency of Communication with Friends and Family: Not on file  . Frequency of Social Gatherings with Friends and Family: Not on file  . Attends Religious Services: Not on file  . Active Member of Clubs or Organizations: Not on file  . Attends Archivist Meetings: Not on file  . Marital Status: Not on file  Intimate Partner Violence:   . Fear of Current or Ex-Partner: Not on file  . Emotionally Abused: Not on file  . Physically Abused: Not on file  . Sexually Abused: Not on file    Family History  Problem Relation Age of Onset  . Diabetes Mother   . Aneurysm Mother   . Stroke Mother   . Hypertension Mother   . Alcohol abuse Father   . Cancer Father        Lung  . Cancer Sister        Lung Cancer  . Post-traumatic stress disorder Brother   . Heart attack Neg Hx   . Colon cancer Neg Hx   . Liver cancer Neg Hx   . Rectal cancer Neg Hx   . Esophageal cancer Neg Hx   . Stomach cancer Neg Hx   . Inflammatory bowel disease Neg Hx     BP 124/72   Ht 6' 3" (1.905 m)   Wt 159 lb (72.1 kg)   BMI 19.87 kg/m   ROS:  See HPI CONST: no F/C, no malaise, no fatigue MSK: See above NEURO: no weakness SKIN: no rash, no lesions HEME: no bleeding, no bruising, no erythema  Objective: GEN: Alert and oriented, NAD Pulm:  Breathing unlabored PSY: normal mood, congruent affect  Right shoulder Well developed, well nourished, in no acute distress. No swelling, ecchymoses.  No gross deformity. No TTP. FROM. Strength 5/5 with empty can and resisted internal/external rotation. Negative Hawkins, Neers. Negative Yergasons. Negative apprehension. NV intact distally.  Left shoulder Well developed, well nourished, in no acute distress. No swelling, ecchymoses.  No gross deformity. No TTP. Decreased ROM in abduction Biceps strength 4/5 Supraspinatus strength 4/5 with pain Negative Hawkins, Neers. Negative Yergasons. Negative apprehension. NV intact distally.  Neck No gross deformity, swelling, bruising. No midline/bony TTP. Decreased ROM Sensation intact to light touch.   2+ equal reflexes in triceps, biceps, brachioradialis tendons. Positive spurlings. NV intact distal BUEs.  Limited MSK Ultrasound: Left shoulder No evidence of joint effusion.   The biceps brachii long head tendon is normal without tendinosis, tear, tenosynovitis, or subluxation/dislocation in short and long axis view. Supraspinatus, infraspinatus, subscapularis, and teres minor tendons visualized without mild hypoechoic changes No subacromial bursal abnormality Posterior labrum is unremarkable AC joint visualized in the long axis with mild effusion  Impression: Left rotator cuff tendinosis with mild AC arthritis   Assessment and Plan:  1.  Left neck and shoulder pain  Overall, given that he has no frank rotator cuff tear on imaging in the setting of failed steroid injection, leads me to believe  this may be originating from his neck.  We will trial patient on 7-day course of p.o. prednisone and send patient to physical therapy to hopefully receive traction treatment.  I will see him back in 1 month, at which time if there is no improvement, I will order a MRI of his cervical spine.  Lanier Clam, DO, [facility]  Fellow

## 2019-07-15 ENCOUNTER — Other Ambulatory Visit: Payer: Self-pay

## 2019-07-15 ENCOUNTER — Encounter (INDEPENDENT_AMBULATORY_CARE_PROVIDER_SITE_OTHER): Payer: Self-pay

## 2019-07-15 ENCOUNTER — Ambulatory Visit (HOSPITAL_COMMUNITY): Payer: Medicare Other | Attending: Cardiology

## 2019-07-15 DIAGNOSIS — K861 Other chronic pancreatitis: Secondary | ICD-10-CM

## 2019-07-15 DIAGNOSIS — Z01810 Encounter for preprocedural cardiovascular examination: Secondary | ICD-10-CM

## 2019-07-15 DIAGNOSIS — I480 Paroxysmal atrial fibrillation: Secondary | ICD-10-CM | POA: Diagnosis present

## 2019-07-15 DIAGNOSIS — Z7901 Long term (current) use of anticoagulants: Secondary | ICD-10-CM | POA: Diagnosis present

## 2019-07-15 LAB — MYOCARDIAL PERFUSION IMAGING
LV dias vol: 102 mL (ref 62–150)
LV sys vol: 40 mL
Peak HR: 81 {beats}/min
Rest HR: 63 {beats}/min
SDS: 2
SRS: 0
SSS: 2
TID: 0.99

## 2019-07-15 MED ORDER — TECHNETIUM TC 99M TETROFOSMIN IV KIT
31.9000 | PACK | Freq: Once | INTRAVENOUS | Status: AC | PRN
  Administered 2019-07-15: 31.9 via INTRAVENOUS
  Filled 2019-07-15: qty 32

## 2019-07-15 MED ORDER — TECHNETIUM TC 99M TETROFOSMIN IV KIT
10.6000 | PACK | Freq: Once | INTRAVENOUS | Status: AC | PRN
  Administered 2019-07-15: 10.6 via INTRAVENOUS
  Filled 2019-07-15: qty 11

## 2019-07-15 MED ORDER — REGADENOSON 0.4 MG/5ML IV SOLN
0.4000 mg | Freq: Once | INTRAVENOUS | Status: AC
  Administered 2019-07-15: 0.4 mg via INTRAVENOUS

## 2019-07-17 DIAGNOSIS — G894 Chronic pain syndrome: Secondary | ICD-10-CM | POA: Diagnosis not present

## 2019-07-17 DIAGNOSIS — M25519 Pain in unspecified shoulder: Secondary | ICD-10-CM | POA: Diagnosis not present

## 2019-07-17 DIAGNOSIS — E119 Type 2 diabetes mellitus without complications: Secondary | ICD-10-CM | POA: Diagnosis not present

## 2019-07-17 DIAGNOSIS — Z79899 Other long term (current) drug therapy: Secondary | ICD-10-CM | POA: Diagnosis not present

## 2019-07-17 DIAGNOSIS — K861 Other chronic pancreatitis: Secondary | ICD-10-CM | POA: Diagnosis not present

## 2019-07-22 DIAGNOSIS — M79622 Pain in left upper arm: Secondary | ICD-10-CM | POA: Diagnosis not present

## 2019-07-22 DIAGNOSIS — M6281 Muscle weakness (generalized): Secondary | ICD-10-CM | POA: Diagnosis not present

## 2019-07-22 DIAGNOSIS — M25512 Pain in left shoulder: Secondary | ICD-10-CM | POA: Diagnosis not present

## 2019-07-22 DIAGNOSIS — M50322 Other cervical disc degeneration at C5-C6 level: Secondary | ICD-10-CM | POA: Diagnosis not present

## 2019-07-29 DIAGNOSIS — M6281 Muscle weakness (generalized): Secondary | ICD-10-CM | POA: Diagnosis not present

## 2019-07-29 DIAGNOSIS — M79622 Pain in left upper arm: Secondary | ICD-10-CM | POA: Diagnosis not present

## 2019-07-29 DIAGNOSIS — M50322 Other cervical disc degeneration at C5-C6 level: Secondary | ICD-10-CM | POA: Diagnosis not present

## 2019-07-29 DIAGNOSIS — M25512 Pain in left shoulder: Secondary | ICD-10-CM | POA: Diagnosis not present

## 2019-07-30 ENCOUNTER — Other Ambulatory Visit: Payer: Self-pay

## 2019-07-30 ENCOUNTER — Emergency Department (HOSPITAL_COMMUNITY): Payer: Medicare Other

## 2019-07-30 ENCOUNTER — Inpatient Hospital Stay (HOSPITAL_COMMUNITY)
Admission: EM | Admit: 2019-07-30 | Discharge: 2019-08-09 | DRG: 166 | Disposition: A | Discharge status: Home Health | Payer: Medicare Other | Attending: Family Medicine | Admitting: Family Medicine

## 2019-07-30 ENCOUNTER — Encounter (HOSPITAL_COMMUNITY): Payer: Self-pay | Admitting: Emergency Medicine

## 2019-07-30 DIAGNOSIS — J189 Pneumonia, unspecified organism: Secondary | ICD-10-CM | POA: Diagnosis present

## 2019-07-30 DIAGNOSIS — Z8711 Personal history of peptic ulcer disease: Secondary | ICD-10-CM

## 2019-07-30 DIAGNOSIS — Z9081 Acquired absence of spleen: Secondary | ICD-10-CM

## 2019-07-30 DIAGNOSIS — Z9189 Other specified personal risk factors, not elsewhere classified: Secondary | ICD-10-CM

## 2019-07-30 DIAGNOSIS — R0902 Hypoxemia: Secondary | ICD-10-CM

## 2019-07-30 DIAGNOSIS — J9601 Acute respiratory failure with hypoxia: Secondary | ICD-10-CM | POA: Diagnosis present

## 2019-07-30 DIAGNOSIS — E785 Hyperlipidemia, unspecified: Secondary | ICD-10-CM | POA: Diagnosis present

## 2019-07-30 DIAGNOSIS — R627 Adult failure to thrive: Secondary | ICD-10-CM

## 2019-07-30 DIAGNOSIS — Z823 Family history of stroke: Secondary | ICD-10-CM

## 2019-07-30 DIAGNOSIS — E860 Dehydration: Secondary | ICD-10-CM | POA: Diagnosis not present

## 2019-07-30 DIAGNOSIS — G893 Neoplasm related pain (acute) (chronic): Secondary | ICD-10-CM

## 2019-07-30 DIAGNOSIS — I4821 Permanent atrial fibrillation: Secondary | ICD-10-CM | POA: Diagnosis present

## 2019-07-30 DIAGNOSIS — Z880 Allergy status to penicillin: Secondary | ICD-10-CM

## 2019-07-30 DIAGNOSIS — I4891 Unspecified atrial fibrillation: Secondary | ICD-10-CM

## 2019-07-30 DIAGNOSIS — Z9981 Dependence on supplemental oxygen: Secondary | ICD-10-CM

## 2019-07-30 DIAGNOSIS — K5669 Other partial intestinal obstruction: Secondary | ICD-10-CM | POA: Diagnosis present

## 2019-07-30 DIAGNOSIS — Z888 Allergy status to other drugs, medicaments and biological substances status: Secondary | ICD-10-CM

## 2019-07-30 DIAGNOSIS — L98499 Non-pressure chronic ulcer of skin of other sites with unspecified severity: Secondary | ICD-10-CM | POA: Diagnosis present

## 2019-07-30 DIAGNOSIS — K8689 Other specified diseases of pancreas: Secondary | ICD-10-CM | POA: Diagnosis not present

## 2019-07-30 DIAGNOSIS — R918 Other nonspecific abnormal finding of lung field: Secondary | ICD-10-CM | POA: Diagnosis not present

## 2019-07-30 DIAGNOSIS — E43 Unspecified severe protein-calorie malnutrition: Secondary | ICD-10-CM | POA: Diagnosis present

## 2019-07-30 DIAGNOSIS — K860 Alcohol-induced chronic pancreatitis: Secondary | ICD-10-CM | POA: Diagnosis present

## 2019-07-30 DIAGNOSIS — Z801 Family history of malignant neoplasm of trachea, bronchus and lung: Secondary | ICD-10-CM

## 2019-07-30 DIAGNOSIS — Z7901 Long term (current) use of anticoagulants: Secondary | ICD-10-CM

## 2019-07-30 DIAGNOSIS — F329 Major depressive disorder, single episode, unspecified: Secondary | ICD-10-CM | POA: Diagnosis present

## 2019-07-30 DIAGNOSIS — R0989 Other specified symptoms and signs involving the circulatory and respiratory systems: Secondary | ICD-10-CM | POA: Diagnosis not present

## 2019-07-30 DIAGNOSIS — D649 Anemia, unspecified: Secondary | ICD-10-CM

## 2019-07-30 DIAGNOSIS — E119 Type 2 diabetes mellitus without complications: Secondary | ICD-10-CM | POA: Diagnosis present

## 2019-07-30 DIAGNOSIS — C3412 Malignant neoplasm of upper lobe, left bronchus or lung: Principal | ICD-10-CM | POA: Diagnosis present

## 2019-07-30 DIAGNOSIS — F17210 Nicotine dependence, cigarettes, uncomplicated: Secondary | ICD-10-CM | POA: Diagnosis present

## 2019-07-30 DIAGNOSIS — Z419 Encounter for procedure for purposes other than remedying health state, unspecified: Secondary | ICD-10-CM

## 2019-07-30 DIAGNOSIS — H9392 Unspecified disorder of left ear: Secondary | ICD-10-CM

## 2019-07-30 DIAGNOSIS — F5102 Adjustment insomnia: Secondary | ICD-10-CM

## 2019-07-30 DIAGNOSIS — I959 Hypotension, unspecified: Secondary | ICD-10-CM | POA: Diagnosis present

## 2019-07-30 DIAGNOSIS — R42 Dizziness and giddiness: Secondary | ICD-10-CM

## 2019-07-30 DIAGNOSIS — Z515 Encounter for palliative care: Secondary | ICD-10-CM

## 2019-07-30 DIAGNOSIS — Z7984 Long term (current) use of oral hypoglycemic drugs: Secondary | ICD-10-CM

## 2019-07-30 DIAGNOSIS — C761 Malignant neoplasm of thorax: Secondary | ICD-10-CM | POA: Diagnosis present

## 2019-07-30 DIAGNOSIS — G8929 Other chronic pain: Secondary | ICD-10-CM | POA: Diagnosis present

## 2019-07-30 DIAGNOSIS — Z7189 Other specified counseling: Secondary | ICD-10-CM

## 2019-07-30 DIAGNOSIS — G4700 Insomnia, unspecified: Secondary | ICD-10-CM | POA: Diagnosis present

## 2019-07-30 DIAGNOSIS — K5900 Constipation, unspecified: Secondary | ICD-10-CM | POA: Diagnosis present

## 2019-07-30 DIAGNOSIS — M545 Low back pain: Secondary | ICD-10-CM | POA: Diagnosis present

## 2019-07-30 DIAGNOSIS — M25512 Pain in left shoulder: Secondary | ICD-10-CM | POA: Diagnosis present

## 2019-07-30 DIAGNOSIS — R0602 Shortness of breath: Secondary | ICD-10-CM

## 2019-07-30 DIAGNOSIS — T5991XA Toxic effect of unspecified gases, fumes and vapors, accidental (unintentional), initial encounter: Secondary | ICD-10-CM | POA: Diagnosis not present

## 2019-07-30 DIAGNOSIS — J440 Chronic obstructive pulmonary disease with acute lower respiratory infection: Secondary | ICD-10-CM | POA: Diagnosis present

## 2019-07-30 DIAGNOSIS — T380X5A Adverse effect of glucocorticoids and synthetic analogues, initial encounter: Secondary | ICD-10-CM | POA: Diagnosis not present

## 2019-07-30 DIAGNOSIS — J439 Emphysema, unspecified: Secondary | ICD-10-CM

## 2019-07-30 DIAGNOSIS — Z8719 Personal history of other diseases of the digestive system: Secondary | ICD-10-CM

## 2019-07-30 DIAGNOSIS — Z833 Family history of diabetes mellitus: Secondary | ICD-10-CM

## 2019-07-30 DIAGNOSIS — C413 Malignant neoplasm of ribs, sternum and clavicle: Secondary | ICD-10-CM | POA: Diagnosis present

## 2019-07-30 DIAGNOSIS — N179 Acute kidney failure, unspecified: Secondary | ICD-10-CM | POA: Diagnosis present

## 2019-07-30 DIAGNOSIS — H6192 Disorder of left external ear, unspecified: Secondary | ICD-10-CM

## 2019-07-30 DIAGNOSIS — Z20822 Contact with and (suspected) exposure to covid-19: Secondary | ICD-10-CM | POA: Diagnosis present

## 2019-07-30 DIAGNOSIS — K219 Gastro-esophageal reflux disease without esophagitis: Secondary | ICD-10-CM | POA: Diagnosis present

## 2019-07-30 DIAGNOSIS — Z8249 Family history of ischemic heart disease and other diseases of the circulatory system: Secondary | ICD-10-CM

## 2019-07-30 DIAGNOSIS — I4892 Unspecified atrial flutter: Secondary | ICD-10-CM | POA: Diagnosis present

## 2019-07-30 DIAGNOSIS — I5032 Chronic diastolic (congestive) heart failure: Secondary | ICD-10-CM | POA: Diagnosis present

## 2019-07-30 DIAGNOSIS — E11649 Type 2 diabetes mellitus with hypoglycemia without coma: Secondary | ICD-10-CM | POA: Diagnosis present

## 2019-07-30 DIAGNOSIS — F419 Anxiety disorder, unspecified: Secondary | ICD-10-CM | POA: Diagnosis present

## 2019-07-30 DIAGNOSIS — Z811 Family history of alcohol abuse and dependence: Secondary | ICD-10-CM

## 2019-07-30 DIAGNOSIS — J9621 Acute and chronic respiratory failure with hypoxia: Secondary | ICD-10-CM

## 2019-07-30 DIAGNOSIS — F1021 Alcohol dependence, in remission: Secondary | ICD-10-CM | POA: Diagnosis present

## 2019-07-30 DIAGNOSIS — I110 Hypertensive heart disease with heart failure: Secondary | ICD-10-CM | POA: Diagnosis present

## 2019-07-30 DIAGNOSIS — R29898 Other symptoms and signs involving the musculoskeletal system: Secondary | ICD-10-CM

## 2019-07-30 DIAGNOSIS — R109 Unspecified abdominal pain: Secondary | ICD-10-CM

## 2019-07-30 DIAGNOSIS — Z66 Do not resuscitate: Secondary | ICD-10-CM

## 2019-07-30 DIAGNOSIS — Z743 Need for continuous supervision: Secondary | ICD-10-CM | POA: Diagnosis not present

## 2019-07-30 DIAGNOSIS — Z79899 Other long term (current) drug therapy: Secondary | ICD-10-CM

## 2019-07-30 LAB — CBC WITH DIFFERENTIAL/PLATELET
Abs Immature Granulocytes: 0.1 K/uL — ABNORMAL HIGH (ref 0.00–0.07)
Basophils Absolute: 0.1 K/uL (ref 0.0–0.1)
Basophils Relative: 0 %
Eosinophils Absolute: 0.1 K/uL (ref 0.0–0.5)
Eosinophils Relative: 0 %
HCT: 26.8 % — ABNORMAL LOW (ref 39.0–52.0)
Hemoglobin: 8.4 g/dL — ABNORMAL LOW (ref 13.0–17.0)
Immature Granulocytes: 1 %
Lymphocytes Relative: 5 %
Lymphs Abs: 1.1 K/uL (ref 0.7–4.0)
MCH: 27.7 pg (ref 26.0–34.0)
MCHC: 31.3 g/dL (ref 30.0–36.0)
MCV: 88.4 fL (ref 80.0–100.0)
Monocytes Absolute: 2 K/uL — ABNORMAL HIGH (ref 0.1–1.0)
Monocytes Relative: 9 %
Neutro Abs: 18.8 K/uL — ABNORMAL HIGH (ref 1.7–7.7)
Neutrophils Relative %: 85 %
Platelets: 418 K/uL — ABNORMAL HIGH (ref 150–400)
RBC: 3.03 MIL/uL — ABNORMAL LOW (ref 4.22–5.81)
RDW: 14.9 % (ref 11.5–15.5)
WBC: 22.2 K/uL — ABNORMAL HIGH (ref 4.0–10.5)
nRBC: 0.1 % (ref 0.0–0.2)

## 2019-07-30 LAB — BASIC METABOLIC PANEL WITH GFR
Anion gap: 12 (ref 5–15)
BUN: 25 mg/dL — ABNORMAL HIGH (ref 8–23)
CO2: 21 mmol/L — ABNORMAL LOW (ref 22–32)
Calcium: 8.1 mg/dL — ABNORMAL LOW (ref 8.9–10.3)
Chloride: 103 mmol/L (ref 98–111)
Creatinine, Ser: 1.2 mg/dL (ref 0.61–1.24)
GFR calc Af Amer: >60 mL/min
GFR calc non Af Amer: >60 mL/min
Glucose, Bld: 164 mg/dL — ABNORMAL HIGH (ref 70–99)
Potassium: 4.1 mmol/L (ref 3.5–5.1)
Sodium: 136 mmol/L (ref 135–145)

## 2019-07-30 LAB — TROPONIN I (HIGH SENSITIVITY)
Troponin I (High Sensitivity): 12 ng/L
Troponin I (High Sensitivity): 12 ng/L

## 2019-07-30 LAB — BRAIN NATRIURETIC PEPTIDE: B Natriuretic Peptide: 386.3 pg/mL — ABNORMAL HIGH (ref 0.0–100.0)

## 2019-07-30 MED ORDER — PANTOPRAZOLE SODIUM 40 MG PO TBEC
40.0000 mg | DELAYED_RELEASE_TABLET | Freq: Every day | ORAL | Status: DC
  Administered 2019-07-31 – 2019-08-09 (×10): 40 mg via ORAL
  Filled 2019-07-30 (×10): qty 1

## 2019-07-30 MED ORDER — ATORVASTATIN CALCIUM 10 MG PO TABS
20.0000 mg | ORAL_TABLET | Freq: Every day | ORAL | Status: DC
  Administered 2019-07-31 – 2019-08-09 (×10): 20 mg via ORAL
  Filled 2019-07-30 (×10): qty 2

## 2019-07-30 MED ORDER — NITROGLYCERIN 0.4 MG SL SUBL: 0.4000 mg | SUBLINGUAL_TABLET | SUBLINGUAL | Status: DC | PRN

## 2019-07-30 MED ORDER — PANCRELIPASE (LIP-PROT-AMYL) 36000-114000 UNITS PO CPEP
72000.0000 [IU] | ORAL_CAPSULE | Freq: Every day | ORAL | Status: DC
  Administered 2019-07-31 (×3): 72000 [IU] via ORAL
  Filled 2019-07-30 (×7): qty 2

## 2019-07-30 MED ORDER — NICOTINE 14 MG/24HR TD PT24: 14.0000 mg | MEDICATED_PATCH | Freq: Every day | TRANSDERMAL | Status: DC | PRN

## 2019-07-30 MED ORDER — FAMOTIDINE 20 MG PO TABS
20.0000 mg | ORAL_TABLET | Freq: Two times a day (BID) | ORAL | Status: DC
  Administered 2019-07-30 – 2019-08-09 (×19): 20 mg via ORAL
  Filled 2019-07-30 (×20): qty 1

## 2019-07-30 MED ORDER — ONDANSETRON 4 MG PO TBDP: 4.0000 mg | ORAL_TABLET | Freq: Three times a day (TID) | ORAL | Status: DC | PRN

## 2019-07-30 MED ORDER — PROCHLORPERAZINE MALEATE 10 MG PO TABS
10.0000 mg | ORAL_TABLET | Freq: Three times a day (TID) | ORAL | Status: DC | PRN
  Filled 2019-07-30: qty 1

## 2019-07-30 MED ORDER — OXYCODONE-ACETAMINOPHEN 5-325 MG PO TABS
1.0000 | ORAL_TABLET | Freq: Once | ORAL | Status: AC
  Administered 2019-07-30: 1 via ORAL
  Filled 2019-07-30: qty 1

## 2019-07-30 MED ORDER — ADULT MULTIVITAMIN W/MINERALS CH
1.0000 | ORAL_TABLET | Freq: Every day | ORAL | Status: DC
  Administered 2019-07-31 – 2019-08-09 (×7): 1 via ORAL
  Filled 2019-07-30 (×7): qty 1

## 2019-07-30 MED ORDER — ENSURE ENLIVE PO LIQD
237.0000 mL | Freq: Three times a day (TID) | ORAL | Status: DC
  Administered 2019-07-31 – 2019-08-07 (×10): 237 mL via ORAL
  Filled 2019-07-30: qty 237

## 2019-07-30 MED ORDER — AMITRIPTYLINE HCL 10 MG PO TABS
10.0000 mg | ORAL_TABLET | Freq: Every evening | ORAL | Status: DC | PRN
  Filled 2019-07-30: qty 1

## 2019-07-30 MED ORDER — HYDROCODONE-ACETAMINOPHEN 5-325 MG PO TABS
1.0000 | ORAL_TABLET | Freq: Four times a day (QID) | ORAL | Status: DC | PRN
  Administered 2019-07-31: 1 via ORAL
  Administered 2019-07-31 – 2019-08-01 (×3): 2 via ORAL
  Filled 2019-07-30 (×4): qty 2

## 2019-07-30 MED ORDER — POLYETHYLENE GLYCOL 3350 17 G PO PACK
17.0000 g | PACK | Freq: Every day | ORAL | Status: DC
  Administered 2019-08-02 – 2019-08-07 (×4): 17 g via ORAL
  Filled 2019-07-30 (×6): qty 1

## 2019-07-30 MED ORDER — GABAPENTIN 100 MG PO CAPS
100.0000 mg | ORAL_CAPSULE | Freq: Three times a day (TID) | ORAL | Status: DC
  Administered 2019-07-30 – 2019-08-09 (×28): 100 mg via ORAL
  Filled 2019-07-30 (×29): qty 1

## 2019-07-30 MED ORDER — IOHEXOL 350 MG/ML SOLN
100.0000 mL | Freq: Once | INTRAVENOUS | Status: AC | PRN
  Administered 2019-07-30: 100 mL via INTRAVENOUS

## 2019-07-30 MED ORDER — METOPROLOL TARTRATE 25 MG PO TABS
75.0000 mg | ORAL_TABLET | Freq: Two times a day (BID) | ORAL | Status: DC
  Administered 2019-07-30: 75 mg via ORAL
  Filled 2019-07-30: qty 3

## 2019-07-30 MED ORDER — SODIUM CHLORIDE 0.9 % IV SOLN: INTRAVENOUS | Status: DC

## 2019-07-30 MED ORDER — MAGNESIUM OXIDE 400 (241.3 MG) MG PO TABS
400.0000 mg | ORAL_TABLET | Freq: Every day | ORAL | Status: DC
  Administered 2019-07-31 – 2019-08-03 (×4): 400 mg via ORAL
  Filled 2019-07-30 (×5): qty 1

## 2019-07-30 MED ORDER — MOMETASONE FURO-FORMOTEROL FUM 200-5 MCG/ACT IN AERO
2.0000 | INHALATION_SPRAY | Freq: Two times a day (BID) | RESPIRATORY_TRACT | Status: DC
  Administered 2019-07-31 – 2019-08-09 (×15): 2 via RESPIRATORY_TRACT
  Filled 2019-07-30 (×2): qty 8.8

## 2019-07-30 MED ORDER — ACETAMINOPHEN 325 MG PO TABS
650.0000 mg | ORAL_TABLET | Freq: Four times a day (QID) | ORAL | Status: DC
  Administered 2019-07-30 – 2019-08-09 (×31): 650 mg via ORAL
  Filled 2019-07-30 (×33): qty 2

## 2019-07-30 MED ORDER — SODIUM CHLORIDE 0.9 % IV BOLUS
1000.0000 mL | Freq: Once | INTRAVENOUS | Status: AC
  Administered 2019-07-30: 1000 mL via INTRAVENOUS

## 2019-07-30 NOTE — H&P (Addendum)
[facility] Admission History and Physical Service Pager: [phone]  Patient name: Timothy Martinez Medical record number: [id number] Date of birth: [birth year] Age: 69 y.o. Gender: male  Primary Care Provider: [name], DO Consultants: Pulmonology, possibly oncology Code Status: FULL  Preferred Emergency Contact: [name], daughter [phone], and Sarita Bottom, daughter, [phone]  Chief Complaint: Dizziness  Assessment and Plan: Timothy Martinez is a 69 y.o. male presenting with dizziness and dyspnea. PMH is significant for chronic alcoholic pancreatitis, tobacco abuse, GERD, COPD, type II diabetes, A. Fib, HFpEF, lumbar back pain, hyperlipidemia  SOB and dizziness likely 2/2 anemia and new lung mass  Patient presents today with 1 day history of dizziness and 1 month history of dyspnea. Causes of his SOB are likely multifactorial: Lung cancer, possible COPD, and anemia. Pt is a longterm smoker and continues to smoke. He is not on supplemental O2 at home and has not required O2 in the ED.  CXR: large opacity within the left upper lobe suspicious for the presence of a large lung mass. CT chest, abdomen, pelvis: large left upper lobe mass lesion and chest wall invasion in rib destruction as described consistent with a primary pulmonary neoplasm till proven otherwise. Mediastinal and hilar adenopathy is seen.  No evidence of PE. Pt left shoulder pain is likely secondary to this large mass. Pt has had a drop in Hb from 11.2 to 8.4 over the last month. Pt shows no signs of active bleeding on examination today and denies hemoptysis, rectal bleeding or melena. Drop in Hb could be secondary to likely lung ca although GI bleed cannot be excluded and pt is taking Xarelto and Aleve. Pt deferred rectal exam until tomorrow. Will also do FOBT tomorrow. Considered CAP given dyspnea and WBC 22, however patient is not hypoxic and CXR does not show infiltrate. Pt denies fevers  and has been afebrile. CURB score 1. Considered COVID as contributing factor but pt denies recent unwell contacts. COVID test result is pending.  -Admit to [facility] telemetry, Attending:Dr Owens Shark -Vitals per floor routine -Continuous pulse oximetry and telemetry  -IVF N/S 167m/hr  -Up with assistance -Monitor Hb, Hb threshold 8 -CBC and CMP am -Consult Oncology and Pulmonology am -Hold Xarelto   -Anemia panel -Normal diet   Left ear ulcer See media for image.  Concern for skin cancer given chronic nature of this ulceration and location on a frequently sun exposed area. -Wound care consult for dressing -Dermatology referral as OP   AKI Cr 1.2 today, 0.76 on 12/4.  Likely due to dehydration due to reduction in PO intake recently per patient history.  Received 1L bolus for AKI and hypotension to 89/63 in the ED, and his hypotension resolved. -Avoid nephrotoxic agents -IVF N.S 1213mhour -CMP am  Chronic pancreatitis 2/2 previous ETOH excess CT abdomen today: Stable pancreatic ductal dilatation and multiple calcifications. Previously seen pseudocyst in head of the pancreas poorly visualized. Mild epigastric tenderness on examination, no guarding or rebound tenderness Home meds: Ensure supplements TID between meals, Norco/Vicodin 5-325 mg every 6 hourly, Creon 2 capsules 5 times daily, Zofran 4 mg every 8 hourly, Compazine 10 mg every 8 hourly -Continue home meds  A. Fibrillation  HR 91, EKG: Rate controlled Afib. CHADsVASc score 3 Home meds: Metoprolol 75 mg BID, Xarelto 20 mg once daily -Continue Metoprolol at reduced dose of 50 mg BID d/t hypotension, hold Xarelto  -will discontinue heparin and use SCDs given drop in hemoglobin -Continuous telemetry  Diabetes Mellitus Glucose 119  today, last A1c 6.1 10/31/2017 Home meds: Metformin 1 g BID -Hold Metformin, no need for sliding scale at present -Monitor CBGs daily  L Shoulder Pain No obvious deformity of left shoulder, no  tenderness on palpation of left shoulder joint.  Of note, CT shows partial destruction of 3rd left rib due to lung mass, which may be source of patient's pain. Home meds: Aleve -Hold NSAIDs in setting of anemia  Hyperlipidemia Home meds: Atorvastatin 20 mg once daily -Lipid panel  -Continue statin  HFpEF EF 55-60% with G1 DD on echo in 12/2014.  Euvolemic on exam. Follows with heart care, last seen in 2019 without concern.  - Monitor fluid status   COPD Sats 97% on air, dyspnea at rest, mildly increased WOB. Lung fields clear on exam, no wheeze or crackles. Home meds: Dulera BID  -Continue Dulera -Albuterol PRN   GERD Home meds: Pantoprazole 38m once daily -Continue Protinix   Tobacco use Smokes 1 pack a day since age 69 patient has cut down from 3 ppd -14 mg nicotine patch as needed  Chronic lower back pain, chronic, stable Currently asymptomatic Home meds: hydrocodone, will continue  Previous alcohol use: Stable Last drink was 1.5 years ago.  Encouraged patient to keep this up.  Hx of colonic polyps Three sessile polpys resected during colonoscopy in 2019. No evidence of malignancy   Constipation Home meds: MiraLAX, Magnesium oxide -Continue home meds  FEN/GI: Normal diet, Protonix  Prophylaxis: Heparin per pharmacy consult    Disposition: admit to medical telemetry  History of Present Illness:  Timothy FONTENOTis a 69y.o. male presenting with dizziness and dyspnea.  The patient says his dizziness started a couple days ago and worsened today.  He was having his house painted.  Today, he did not eat anything and noticed that he felt dizzy especially when going from sitting to standing.  He got shaky when he walked to the bathroom, but his symptoms subsided when he sat back down.  He took his BP which was 84/59 on a neighbor's BP cuff.  He got the fire department to check his BP (they live down the street) and they were concerned enough to call EMS.  He says he has  had four negative Covid tests recently and does not know of any sick contacts.  He reports chronic shortness of breath that has worsened about one week ago.  He has had no changes in his cough or sputum production.  He says that his smoking is the cause of his shortness of breath.  He says he had some chest pain today and took a nitroglycerin twice.  He describes this pain as "tight" and relieved by nitroglycerin and occurred when he was sitting still.  He no longer is experiencing this pain.  He says he has had some weight loss recently, about 12 lbs.    In the ED pt was tachpneac and hypotensive. Pertinent labs: WBC 22, Neut 18, Hb 8.4, Platelet 418, Na 136, K 4.1, Cr 1.2, BUN 25. CXR: Large opacity within the left upper lobe suspicious for the presence of a large lung mass. The presence of adjacent cortical destruction of the third left rib is suggestive of malignancy properties. CT chest, abdomen, pelvis: large left upper lobe mass lesion with apparent chest wall invasion in rib destruction as described consistent with a primary pulmonary neoplasm till proven otherwise. Associated mediastinal and hilar adenopathy is seen. No evidence of PE.  Review  Of Systems: Per HPI with the following additions:   Review of Systems  Constitutional: Positive for weight loss. Negative for fever.  Respiratory: Positive for cough (chronic) and shortness of breath. Negative for hemoptysis.   Cardiovascular: Positive for chest pain. Negative for palpitations and orthopnea.  Gastrointestinal: Positive for abdominal pain (chronic). Negative for blood in stool, constipation, diarrhea, melena, nausea and vomiting.  Genitourinary: Negative for dysuria and hematuria.  Neurological: Positive for dizziness.    Patient Active Problem List   Diagnosis Date Noted  . Urinary frequency 06/12/2019  . Left shoulder pain 03/05/2019  . Lesion of left ear 03/05/2019  . Exocrine pancreatic insufficiency 01/24/2019  . Hypoglycemia  associated with diabetes ([place]) 11/21/2018  . Esophagitis determined by endoscopy 11/21/2018  . Gastric ulcer 11/21/2018  . Gastric outlet obstruction 11/19/2018  . Hypovolemia dehydration 11/19/2018  . History of colonic polyps 05/25/2018  . Dilation of pancreatic duct 04/16/2018  . 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 Protein-calorie malnutrition, severe 03/22/2018  . Other partial intestinal obstruction ([place])   . Atrial fibrillation with RVR ([place]) 03/21/2018  . Pseudocyst of pancreas 03/11/2018  . Solitary pulmonary nodule 05/12/2017  . On continuous oral anticoagulation 06/19/2015  . Type 2 diabetes mellitus without complication ([place])   . Chronic pancreatitis ([place]) 08/04/2014  . Abdominal pain 08/04/2014  . Emphysema lung ([place]) 04/27/2012  . GERD (gastroesophageal reflux disease) 03/05/2012  . Chronic alcoholic pancreatitis ([place]) 05/30/2011  . Tobacco abuse 05/30/2011    Past Medical History: Past Medical History:  Diagnosis Date  . Acute on chronic pancreatitis ([place]) 05/17/2016  . Alcohol abuse   . Alcoholic pancreatitis   . Anginal pain ([place])   . Anxiety and depression 01/20/2015   PHQ 9 = 11 (01/20/15)    . Arthritis    "eat up w/it" (05/17/2016)  . Atrial fibrillation status post cardioversion ([place]) 08/2014  . Atrial flutter, unspecified   . CHF (congestive heart failure) ([place])   . Chronic anticoagulation   . Chronic atrial fibrillation ([place])   . Chronic bronchitis ([place])   . Chronic left-sided low back pain with sciatica 12/21/2012  . Chronic pancreatitis ([place])   . COLD (chronic obstructive lung disease) ([place])   . Complication of anesthesia    pt C/O confusion after procedure in April 2020  . Daily headache    "need eye examined" (05/17/2016)  . Gastric outlet obstruction 10/2018  . GERD (gastroesophageal reflux disease)   . Hematuria, microscopic 02/13/2015   Noted on UA - Recheck UA at next visit ~ 1 months   . Hyperlipidemia   . Hypertension   . Insomnia 02/11/2016  .  Lateral epicondylitis of right elbow 03/17/2017  . Left foot pain 10/12/2013  . Loss of weight 02/19/2018  . Low back pain 12/21/2012  . Malnutrition of moderate degree 05/18/2016  . Neck pain, bilateral posterior 10/15/2014  . Neuropathy 03/16/2015  . Pain of joint of left ankle and foot 06/13/2018  . Pain of right upper extremity 05/30/2016  . Pancreatic pseudocyst    seen on CT scan 07/2014  . Pancreatitis 08/04/2014  . Permanent atrial fibrillation ([place]) 05/05/2017  . Pneumonia    "?a few times" (05/17/2016)  . 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 Tobacco abuse 05/30/2011   Heavy smoker up to 3 ppd down to 15 cigs per day in 09/2014   . Type II diabetes mellitus ([place])   . Wears dentures   . Wears glasses     Past Surgical History: Past Surgical  History:  Procedure Laterality Date  . BACK SURGERY    . BIOPSY  11/20/2018   Procedure: BIOPSY;  Surgeon: Rush Landmark Telford Nab., MD;  Location: [facility];  Service: Gastroenterology;;  . BIOPSY  02/04/2019   Procedure: BIOPSY;  Surgeon: Irving Copas., MD;  Location: [place] [facility];  Service: Gastroenterology;;  . CARDIOVERSION N/A 09/22/2014   Procedure: CARDIOVERSION;  Surgeon: Thayer Headings, MD;  Location: [facility];  Service: Cardiovascular;  Laterality: N/A;  . COLONOSCOPY W/ POLYPECTOMY  2019  . ERCP N/A 02/04/2019   Procedure: ENDOSCOPIC RETROGRADE CHOLANGIOPANCREATOGRAPHY (ERCP);  Surgeon: Irving Copas., MD;  Location: [facility];  Service: Gastroenterology;  Laterality: N/A;  . ESOPHAGOGASTRODUODENOSCOPY (EGD) WITH PROPOFOL N/A 11/20/2018   Procedure: ESOPHAGOGASTRODUODENOSCOPY (EGD) WITH PROPOFOL;  Surgeon: Rush Landmark Telford Nab., MD;  Location: [facility];  Service: Gastroenterology;  Laterality: N/A;  . ESOPHAGOGASTRODUODENOSCOPY (EGD) WITH PROPOFOL N/A 02/04/2019   Procedure: ESOPHAGOGASTRODUODENOSCOPY (EGD) WITH PROPOFOL;  Surgeon: Rush Landmark Telford Nab., MD;  Location: [facility];  Service: Gastroenterology;   Laterality: N/A;  . FOREIGN BODY REMOVAL  11/20/2018   Procedure: FOREIGN BODY REMOVAL;  Surgeon: Rush Landmark Telford Nab., MD;  Location: [facility];  Service: Gastroenterology;;  . [place]   not cervical, states had lesion on neck which was removed  . POSTERIOR LAMINECTOMY / DECOMPRESSION LUMBAR SPINE  10/2006   of L4, L5 and S1 with a 5-1 diskectomy, microdissection with the microscope/notes 10/24/2006  . SPLENECTOMY  2003  . UPPER ESOPHAGEAL ENDOSCOPIC ULTRASOUND (EUS) N/A 11/20/2018   Procedure: UPPER ESOPHAGEAL ENDOSCOPIC ULTRASOUND (EUS);  Surgeon: Irving Copas., MD;  Location: [facility];  Service: Gastroenterology;  Laterality: N/A;  . UPPER ESOPHAGEAL ENDOSCOPIC ULTRASOUND (EUS) N/A 02/04/2019   Procedure: UPPER ESOPHAGEAL ENDOSCOPIC ULTRASOUND (EUS);  Surgeon: Irving Copas., MD;  Location: [facility];  Service: Gastroenterology;  Laterality: N/A;    Social History: Social History   Tobacco Use  . Smoking status: Current Every Day Smoker    Packs/day: 0.50    Years: 52.00    Pack years: 26.00    Types: Cigarettes    Start date: 04/09/1964  . Smokeless tobacco: Never Used  . Tobacco comment: down to one pack daily  Substance Use Topics  . Alcohol use: Not Currently    Comment: QUIT IN 02/2018  . Drug use: No   Additional social history:  Please also refer to relevant sections of EMR.  Family History: Family History  Problem Relation Age of Onset  . Diabetes Mother   . Aneurysm Mother   . Stroke Mother   . Hypertension Mother   . Alcohol abuse Father   . Cancer Father        Lung  . Cancer Sister        Lung Cancer  . Post-traumatic stress disorder Brother   . Heart attack Neg Hx   . Colon cancer Neg Hx   . Liver cancer Neg Hx   . Rectal cancer Neg Hx   . Esophageal cancer Neg Hx   . Stomach cancer Neg Hx   . Inflammatory bowel disease Neg Hx    (If not completed, MUST add something in)  Allergies and  Medications: Allergies  Allergen Reactions  . Prozac [Fluoxetine Hcl] Nausea Only    Made the patient very sick to his stomach  . Morphine And Related Swelling    Pt states he can tolerate oxycodone and hydromorphone  . Penicillins Rash    Has patient had a PCN  reaction causing immediate rash, facial/tongue/throat swelling, SOB or lightheadedness with hypotension: Yes Has patient had a PCN reaction causing severe rash involving mucus membranes or skin necrosis: No Has patient had a PCN reaction that required hospitalization: No Has patient had a PCN reaction occurring within the last 10 years: No If all of the above answers are "NO", then may proceed with Cephalosporin use.    No current facility-administered medications on file prior to encounter.   Current Outpatient Medications on File Prior to Encounter  Medication Sig Dispense Refill  . acetaminophen (TYLENOL) 325 MG tablet Take 2 tablets (650 mg total) by mouth every 6 (six) hours.    Marland Kitchen amitriptyline (ELAVIL) 10 MG tablet TAKE 1 TABLET BY MOUTH EVERYDAY AT BEDTIME 90 tablet 1  . atorvastatin (LIPITOR) 40 MG tablet Take 0.5 tablets (20 mg total) by mouth daily at 6 PM. 90 tablet 3  . Blood Glucose Monitoring Suppl (ONE TOUCH ULTRA 2) w/Device KIT Dispense one glucometer. Diagnosis Type 2 Diabetes. E11.9 1 each 0  . docusate sodium (COLACE) 50 MG capsule Take 1 capsule (50 mg total) by mouth 2 (two) times daily. 30 capsule 0  . famotidine (PEPCID) 20 MG tablet TAKE 1 TABLET BY MOUTH TWICE A DAY 30 tablet 3  . feeding supplement, ENSURE ENLIVE, (ENSURE ENLIVE) LIQD Take 237 mLs by mouth 3 (three) times daily between meals. 237 mL 12  . gabapentin (NEURONTIN) 100 MG capsule TAKE 1 CAPSULE BY MOUTH THREE TIMES A DAY 270 capsule 2  . glucose blood (ONE TOUCH ULTRA TEST) test strip Use check fasting blood glucose qAM. Type 2 Diabetes E11.9 100 each 3  . HYDROcodone-acetaminophen (NORCO/VICODIN) 5-325 MG tablet Take 1 tablet by mouth every  6 (six) hours as needed.    . Lancets (FREESTYLE) lancets Use as instructed 100 each 12  . Lancets (ONETOUCH ULTRASOFT) lancets Use as instructed 100 each 12  . lipase/protease/amylase (CREON) 36000 UNITS CPEP capsule Take 2 capsules by mouth 5 (five) times daily.    . magnesium oxide (MAG-OX) 400 MG tablet TAKE 1 TABLET BY MOUTH EVERY DAY 60 tablet 0  . metFORMIN (GLUCOPHAGE) 1000 MG tablet Take 1 tablet (1,000 mg total) by mouth 2 (two) times daily with a meal. 180 tablet 3  . metoprolol tartrate (LOPRESSOR) 50 MG tablet Take 1.5 tablets (75 mg total) by mouth 2 (two) times daily. 270 tablet 3  . mometasone-formoterol (DULERA) 200-5 MCG/ACT AERO Inhale 2 puffs into the lungs 2 (two) times daily. 3 Inhaler 1  . Multiple Vitamin (MULTIVITAMIN WITH MINERALS) TABS tablet Take 1 tablet by mouth daily.    . nitroGLYCERIN (NITROSTAT) 0.4 MG SL tablet Place 1 tablet (0.4 mg total) under the tongue every 5 (five) minutes as needed for chest pain. 100 tablet 3  . ondansetron (ZOFRAN ODT) 4 MG disintegrating tablet Take 1 tablet (4 mg total) by mouth every 8 (eight) hours as needed for nausea or vomiting. 10 tablet 0  . pantoprazole (PROTONIX) 40 MG tablet Take 1 tablet (40 mg total) by mouth daily. 90 tablet 3  . polyethylene glycol (MIRALAX / GLYCOLAX) 17 g packet TAKE 17 G BY MOUTH DAILY. 14 packet 0  . polyethylene glycol powder (GLYCOLAX/MIRALAX) 17 GM/SCOOP powder Take 17 g by mouth daily. 500 g 1  . predniSONE (DELTASONE) 20 MG tablet Take 1 tab daily for 7 days. 7 tablet 0  . prochlorperazine (COMPAZINE) 10 MG tablet Take 1 tablet (10 mg total) by mouth every 8 (eight) hours as  needed for nausea or vomiting. 30 tablet 2  . tamsulosin (FLOMAX) 0.4 MG CAPS capsule Take 1 capsule (0.4 mg total) by mouth daily. 30 capsule 3  . XARELTO 20 MG TABS tablet TAKE 1 TABLET BY MOUTH DAILY WITH SUPPER 90 tablet 1  . [DISCONTINUED] famotidine (PEPCID) 20 MG tablet Take 1 tablet (20 mg total) by mouth 2 (two)  times daily. 30 tablet 3    Objective: BP (!) 149/104   Pulse 91   Temp (!) 97.3 F (36.3 C) (Oral)   Resp 20   Ht '6\' 3"'  (1.905 m)   Wt 74.4 kg   SpO2 97%   BMI 20.50 kg/m   Exam: General: well appearing 69 yr old male, alert, cooperative, comfortable  Eyes: Normal EOM, no scleral icterus ENTM: dentures present, no pharyngeal edema  Neck: supple, no thyromegaly, no lymphadenopathy  Cardiovascular: S1 and S2 p+ Afib, pulse irregular, no rubs or gallops  Respiratory: mildly increased WOB, clear lung fields, no crackles or wheeze Gastrointestinal: abdomen soft, epigastric tenderness, no guarding  MSK: moving all 4 limbs equally Derm: ulcer on upper part of left ear, covered with bandaid. See media for images Neuro: Cranial nerves intact Psych: normal mood, normal affect     Labs and Imaging: CBC BMET  Recent Labs  Lab 07/30/19 1719  WBC 22.2*  HGB 8.4*  HCT 26.8*  PLT 418*   Recent Labs  Lab 07/30/19 1719  NA 136  K 4.1  CL 103  CO2 21*  BUN 25*  CREATININE 1.20  GLUCOSE 164*  CALCIUM 8.1*     EKG: Rate controlled Afib    Lattie Haw, MD 07/30/2019, 9:23 PM PGY-1, [employer] Intern pager: [phone], text pages welcome  FPTS Upper-Level Resident Addendum   I have independently interviewed and examined the patient. I have discussed the above with the original author and agree with their documentation. My edits for correction/addition/clarification are in blue. Please see also any attending notes.    Kathrene Alu, MD PGY-3, [facility] 07/31/2019 1:29 AM  FPTS Service pager: [phone] (text pages welcome through [facility])

## 2019-07-30 NOTE — ED Provider Notes (Signed)
[facility] EMERGENCY DEPARTMENT Provider Note   CSN: [id number] Arrival date & time: 07/30/19  1703     History Chief Complaint  Patient presents with  . Hypotension    Timothy Martinez is a 69 y.o. male.  Presents to ER after lightheadedness episode.  Patient states that he was having his apartment painted, smelling fumes and had not eaten or drink anything much today when he felt lightheaded, like he was about to pass out.  No syncope, no falls.  Says he checked his blood pressure at home and it was low.  EMS reports symptoms and blood pressure improved after receiving fluids.  Patient currently has no complaints.  Says he has been seeing an orthopedic doctor regarding his left shoulder pain.  Has been feeling more short of breath than normal over the past few weeks.  Says he gets out of breath going up flight of steps to his apartment.  Currently not have any chest pain or difficulty in breathing.  Also denies any abdominal pain, reports history of chronic pancreatitis.  Lifelong smoker, 1 pack daily since age 28.  HPI     Past Medical History:  Diagnosis Date  . Acute on chronic pancreatitis ([place]) 05/17/2016  . Alcohol abuse   . Alcoholic pancreatitis   . Anginal pain ([place])   . Anxiety and depression 01/20/2015   PHQ 9 = 11 (01/20/15)    . Arthritis    "eat up w/it" (05/17/2016)  . Atrial fibrillation status post cardioversion ([place]) 08/2014  . Atrial flutter, unspecified   . CHF (congestive heart failure) ([place])   . Chronic anticoagulation   . Chronic atrial fibrillation ([place])   . Chronic bronchitis ([place])   . Chronic left-sided low back pain with sciatica 12/21/2012  . Chronic pancreatitis ([place])   . COLD (chronic obstructive lung disease) ([place])   . Complication of anesthesia    pt C/O confusion after procedure in April 2020  . Daily headache    "need eye examined" (05/17/2016)  . Gastric outlet obstruction 10/2018  . GERD (gastroesophageal reflux disease)    . Hematuria, microscopic 02/13/2015   Noted on UA - Recheck UA at next visit ~ 1 months   . Hyperlipidemia   . Hypertension   . Insomnia 02/11/2016  . Lateral epicondylitis of right elbow 03/17/2017  . Left foot pain 10/12/2013  . Loss of weight 02/19/2018  . Low back pain 12/21/2012  . Malnutrition of moderate degree 05/18/2016  . Neck pain, bilateral posterior 10/15/2014  . Neuropathy 03/16/2015  . Pain of joint of left ankle and foot 06/13/2018  . Pain of right upper extremity 05/30/2016  . Pancreatic pseudocyst    seen on CT scan 07/2014  . Pancreatitis 08/04/2014  . Permanent atrial fibrillation ([place]) 05/05/2017  . Pneumonia    "?a few times" (05/17/2016)  . 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 Tobacco abuse 05/30/2011   Heavy smoker up to 3 ppd down to 15 cigs per day in 09/2014   . Type II diabetes mellitus ([place])   . Wears dentures   . Wears glasses     Patient Active Problem List   Diagnosis Date Noted  . Urinary frequency 06/12/2019  . Left shoulder pain 03/05/2019  . Lesion of left ear 03/05/2019  . Exocrine pancreatic insufficiency 01/24/2019  . Hypoglycemia associated with diabetes ([place]) 11/21/2018  . Esophagitis determined by endoscopy 11/21/2018  . Gastric ulcer 11/21/2018  . Gastric outlet obstruction 11/19/2018  . Hypovolemia dehydration  11/19/2018  . History of colonic polyps 05/25/2018  . Dilation of pancreatic duct 04/16/2018  . 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 Protein-calorie malnutrition, severe 03/22/2018  . Other partial intestinal obstruction ([place])   . Atrial fibrillation with RVR ([place]) 03/21/2018  . Pseudocyst of pancreas 03/11/2018  . Solitary pulmonary nodule 05/12/2017  . On continuous oral anticoagulation 06/19/2015  . Type 2 diabetes mellitus without complication ([place])   . Chronic pancreatitis ([place]) 08/04/2014  . Abdominal pain 08/04/2014  . Emphysema lung ([place]) 04/27/2012  . GERD (gastroesophageal reflux disease) 03/05/2012  . Chronic alcoholic pancreatitis  ([place]) 05/30/2011  . Tobacco abuse 05/30/2011    Past Surgical History:  Procedure Laterality Date  . BACK SURGERY    . BIOPSY  11/20/2018   Procedure: BIOPSY;  Surgeon: Rush Landmark Telford Nab., MD;  Location: [facility];  Service: Gastroenterology;;  . BIOPSY  02/04/2019   Procedure: BIOPSY;  Surgeon: Irving Copas., MD;  Location: [facility];  Service: Gastroenterology;;  . CARDIOVERSION N/A 09/22/2014   Procedure: CARDIOVERSION;  Surgeon: Thayer Headings, MD;  Location: [facility];  Service: Cardiovascular;  Laterality: N/A;  . COLONOSCOPY W/ POLYPECTOMY  2019  . ERCP N/A 02/04/2019   Procedure: ENDOSCOPIC RETROGRADE CHOLANGIOPANCREATOGRAPHY (ERCP);  Surgeon: Irving Copas., MD;  Location: [facility];  Service: Gastroenterology;  Laterality: N/A;  . ESOPHAGOGASTRODUODENOSCOPY (EGD) WITH PROPOFOL N/A 11/20/2018   Procedure: ESOPHAGOGASTRODUODENOSCOPY (EGD) WITH PROPOFOL;  Surgeon: Rush Landmark Telford Nab., MD;  Location: [facility];  Service: Gastroenterology;  Laterality: N/A;  . ESOPHAGOGASTRODUODENOSCOPY (EGD) WITH PROPOFOL N/A 02/04/2019   Procedure: ESOPHAGOGASTRODUODENOSCOPY (EGD) WITH PROPOFOL;  Surgeon: Rush Landmark Telford Nab., MD;  Location: [facility];  Service: Gastroenterology;  Laterality: N/A;  . FOREIGN BODY REMOVAL  11/20/2018   Procedure: FOREIGN BODY REMOVAL;  Surgeon: Rush Landmark Telford Nab., MD;  Location: [facility];  Service: Gastroenterology;;  . [place]   not cervical, states had lesion on neck which was removed  . POSTERIOR LAMINECTOMY / DECOMPRESSION LUMBAR SPINE  10/2006   of L4, L5 and S1 with a 5-1 diskectomy, microdissection with the microscope/notes 10/24/2006  . SPLENECTOMY  2003  . UPPER ESOPHAGEAL ENDOSCOPIC ULTRASOUND (EUS) N/A 11/20/2018   Procedure: UPPER ESOPHAGEAL ENDOSCOPIC ULTRASOUND (EUS);  Surgeon: Irving Copas., MD;  Location: [facility];  Service: Gastroenterology;  Laterality: N/A;  . UPPER  ESOPHAGEAL ENDOSCOPIC ULTRASOUND (EUS) N/A 02/04/2019   Procedure: UPPER ESOPHAGEAL ENDOSCOPIC ULTRASOUND (EUS);  Surgeon: Irving Copas., MD;  Location: [facility];  Service: Gastroenterology;  Laterality: N/A;       Family History  Problem Relation Age of Onset  . Diabetes Mother   . Aneurysm Mother   . Stroke Mother   . Hypertension Mother   . Alcohol abuse Father   . Cancer Father        Lung  . Cancer Sister        Lung Cancer  . Post-traumatic stress disorder Brother   . Heart attack Neg Hx   . Colon cancer Neg Hx   . Liver cancer Neg Hx   . Rectal cancer Neg Hx   . Esophageal cancer Neg Hx   . Stomach cancer Neg Hx   . Inflammatory bowel disease Neg Hx     Social History   Tobacco Use  . Smoking status: Current Every Day Smoker    Packs/day: 0.50    Years: 52.00    Pack years: 26.00    Types: Cigarettes    Start date: 04/09/1964  .  Smokeless tobacco: Never Used  . Tobacco comment: down to one pack daily  Substance Use Topics  . Alcohol use: Not Currently    Comment: QUIT IN 02/2018  . Drug use: No    Home Medications Prior to Admission medications   Medication Sig Start Date End Date Taking? Authorizing Provider  acetaminophen (TYLENOL) 325 MG tablet Take 2 tablets (650 mg total) by mouth every 6 (six) hours. 03/23/18   Meccariello, Bernita Raisin, DO  amitriptyline (ELAVIL) 10 MG tablet TAKE 1 TABLET BY MOUTH EVERYDAY AT BEDTIME 02/18/19   Lockamy, Timothy, DO  atorvastatin (LIPITOR) 40 MG tablet Take 0.5 tablets (20 mg total) by mouth daily at 6 PM. 11/23/18   Patriciaann Clan, DO  Blood Glucose Monitoring Suppl (ONE TOUCH ULTRA 2) w/Device KIT Dispense one glucometer. Diagnosis Type 2 Diabetes. E11.9 06/13/18   Rory Percy, DO  docusate sodium (COLACE) 50 MG capsule Take 1 capsule (50 mg total) by mouth 2 (two) times daily. 12/28/18   [name], DO  famotidine (PEPCID) 20 MG tablet TAKE 1 TABLET BY MOUTH TWICE A DAY 04/05/19   Lockamy, Timothy, DO    feeding supplement, ENSURE ENLIVE, (ENSURE ENLIVE) LIQD Take 237 mLs by mouth 3 (three) times daily between meals. 11/23/18   Patriciaann Clan, DO  gabapentin (NEURONTIN) 100 MG capsule TAKE 1 CAPSULE BY MOUTH THREE TIMES A DAY 03/05/19   Lockamy, Timothy, DO  glucose blood (ONE TOUCH ULTRA TEST) test strip Use check fasting blood glucose qAM. Type 2 Diabetes E11.9 10/01/18   [name], DO  HYDROcodone-acetaminophen (NORCO/VICODIN) 5-325 MG tablet Take 1 tablet by mouth every 6 (six) hours as needed. 05/27/19   [provider]  Lancets (FREESTYLE) lancets Use as instructed 10/02/18   [name], DO  Lancets (ONETOUCH ULTRASOFT) lancets Use as instructed 10/02/18   [name], DO  lipase/protease/amylase (CREON) 36000 UNITS CPEP capsule Take 2 capsules by mouth 5 (five) times daily. 01/28/19   [provider]  magnesium oxide (MAG-OX) 400 MG tablet TAKE 1 TABLET BY MOUTH EVERY DAY 11/20/18   [name], DO  metFORMIN (GLUCOPHAGE) 1000 MG tablet Take 1 tablet (1,000 mg total) by mouth 2 (two) times daily with a meal. 03/05/19   Lockamy, Timothy, DO  metoprolol tartrate (LOPRESSOR) 50 MG tablet Take 1.5 tablets (75 mg total) by mouth 2 (two) times daily. 05/15/19   Lockamy, Christia Reading, DO  mometasone-formoterol (DULERA) 200-5 MCG/ACT AERO Inhale 2 puffs into the lungs 2 (two) times daily. 09/27/18   [name], DO  Multiple Vitamin (MULTIVITAMIN WITH MINERALS) TABS tablet Take 1 tablet by mouth daily. 11/24/18   Patriciaann Clan, DO  nitroGLYCERIN (NITROSTAT) 0.4 MG SL tablet Place 1 tablet (0.4 mg total) under the tongue every 5 (five) minutes as needed for chest pain. 03/23/18   Meccariello, Bernita Raisin, DO  ondansetron (ZOFRAN ODT) 4 MG disintegrating tablet Take 1 tablet (4 mg total) by mouth every 8 (eight) hours as needed for nausea or vomiting. 12/22/18   Albrizze, Kaitlyn E, PA-C  pantoprazole (PROTONIX) 40 MG tablet Take 1 tablet (40 mg total) by mouth daily. 10/01/18    Lockamy, Christia Reading, DO  polyethylene glycol (MIRALAX / GLYCOLAX) 17 g packet TAKE 17 G BY MOUTH DAILY. 11/14/18   [name], DO  polyethylene glycol powder (GLYCOLAX/MIRALAX) 17 GM/SCOOP powder Take 17 g by mouth daily. 07/05/19   [name], DO  predniSONE (DELTASONE) 20 MG tablet Take 1 tab daily for 7 days. 07/12/19  Maneen, Dominic, DO  prochlorperazine (COMPAZINE) 10 MG tablet Take 1 tablet (10 mg total) by mouth every 8 (eight) hours as needed for nausea or vomiting. 01/01/19   Mansouraty, Telford Nab., MD  tamsulosin (FLOMAX) 0.4 MG CAPS capsule Take 1 capsule (0.4 mg total) by mouth daily. 06/12/19   Lockamy, Timothy, DO  XARELTO 20 MG TABS tablet TAKE 1 TABLET BY MOUTH DAILY WITH SUPPER 02/27/19   Lockamy, Timothy, DO  famotidine (PEPCID) 20 MG tablet Take 1 tablet (20 mg total) by mouth 2 (two) times daily. 03/27/19   [name], DO    Allergies    Prozac [fluoxetine hcl], Morphine and related, and Penicillins  Review of Systems   Review of Systems  Constitutional: Negative for chills and fever.  HENT: Negative for ear pain and sore throat.   Eyes: Negative for pain and visual disturbance.  Respiratory: Negative for cough and shortness of breath.   Cardiovascular: Negative for chest pain and palpitations.  Gastrointestinal: Negative for abdominal pain and vomiting.  Genitourinary: Negative for dysuria and hematuria.  Musculoskeletal: Negative for arthralgias and back pain.  Skin: Negative for color change and rash.  Neurological: Positive for light-headedness. Negative for seizures and syncope.  All other systems reviewed and are negative.   Physical Exam Updated Vital Signs BP 94/63 (BP Location: Right Arm)   Temp (!) 97.3 F (36.3 C) (Oral)   Ht '6\' 3"'  (1.905 m)   Wt 74.4 kg   BMI 20.50 kg/m   Physical Exam Vitals and nursing note reviewed.  Constitutional:      Appearance: He is well-developed.  HENT:     Head: Normocephalic and atraumatic.  Eyes:      Conjunctiva/sclera: Conjunctivae normal.  Cardiovascular:     Rate and Rhythm: Normal rate and regular rhythm.     Heart sounds: No murmur.  Pulmonary:     Effort: Pulmonary effort is normal. No respiratory distress.     Breath sounds: Normal breath sounds.  Abdominal:     Palpations: Abdomen is soft.     Tenderness: There is no abdominal tenderness.  Musculoskeletal:     Cervical back: Neck supple.  Skin:    General: Skin is warm and dry.  Neurological:     General: No focal deficit present.     Mental Status: He is alert and oriented to person, place, and time.  Psychiatric:        Mood and Affect: Mood normal.        Behavior: Behavior normal.     ED Results / Procedures / Treatments   Labs (all labs ordered are listed, but only abnormal results are displayed) Labs Reviewed  CBC WITH DIFFERENTIAL/PLATELET - Abnormal; Notable for the following components:      Result Value   WBC 22.2 (*)    RBC 3.03 (*)    Hemoglobin 8.4 (*)    HCT 26.8 (*)    Platelets 418 (*)    Neutro Abs 18.8 (*)    Monocytes Absolute 2.0 (*)    Abs Immature Granulocytes 0.10 (*)    All other components within normal limits  BASIC METABOLIC PANEL - Abnormal; Notable for the following components:   CO2 21 (*)    Glucose, Bld 164 (*)    BUN 25 (*)    Calcium 8.1 (*)    All other components within normal limits  BRAIN NATRIURETIC PEPTIDE - Abnormal; Notable for the following components:   B Natriuretic Peptide 386.3 (*)  All other components within normal limits  SARS CORONAVIRUS 2 (TAT 6-24 HRS)  URINALYSIS, ROUTINE W REFLEX MICROSCOPIC  TROPONIN I (HIGH SENSITIVITY)  TROPONIN I (HIGH SENSITIVITY)    EKG EKG Interpretation  Date/Time:  Tuesday July 30 2019 17:21:23 EST Ventricular Rate:  105 PR Interval:    QRS Duration: 85 QT Interval:  326 QTC Calculation: 431 R Axis:   117 Text Interpretation: Right and left arm electrode reversal, interpretation assumes no reversal Atrial  fibrillation Probable lateral infarct, age indeterminate Confirmed by Madalyn Rob [phone]) on 07/30/2019 5:30:54 PM   Radiology DG Chest Portable 1 View  Result Date: 07/30/2019 CLINICAL DATA:  Hypotension. EXAM: PORTABLE CHEST 1 VIEW COMPARISON:  December 29, 2018 FINDINGS: Mild, chronic appearing increased interstitial lung markings are noted. A 10.7 cm x 8.7 cm masslike opacity is seen within the left upper lobe. This is increased in severity when compared to the prior study. There is apparent cortical destruction of the adjacent portion of the third left rib. This represents a new finding compared to the prior exam. There is no evidence of a pleural effusion or pneumothorax. The heart size and mediastinal contours are within normal limits. IMPRESSION: 1. Large opacity within the left upper lobe suspicious for the presence of a large lung mass. The presence of adjacent cortical destruction of the third left rib is suggestive of malignant properties. Correlation with chest CT is recommended. Electronically Signed   By: Virgina Norfolk M.D.   On: 07/30/2019 17:57    Procedures Procedures (including critical care time)  Medications Ordered in ED Medications  sodium chloride 0.9 % bolus 1,000 mL (1,000 mLs Intravenous New Bag/Given 07/30/19 1727)    ED Course  I have reviewed the triage vital signs and the nursing notes.  Pertinent labs & imaging results that were available during my care of the patient were reviewed by me and considered in my medical decision making (see chart for details).  Clinical Course as of Jul 29 1918  Tue Jul 30, 2019  4287 Concern for new left upper lobe lung mass, will get CTA chest to rule out PE, further characterize lung mass, will get CT abdomen pelvis to evaluate for metastatic disease, likely discuss with pulm, admit medicine  DG Chest Portable 1 View [RD]  1917 Updated patient   [RD]    Clinical Course User Index [RD] Lucrezia Starch, MD   MDM  Rules/Calculators/A&P                      69 year old male presents to ER with near syncopal episode, lightheadedness, low blood pressure.  Symptoms improved with fluids.  Work-up there was concerning for leukocytosis, new left lung mass.  Ordered CT chest for further characterize lung mass, rule out pulmonary embolism as well as CT abdomen pelvis to evaluate for any metastatic disease.  No PE but did demonstrate large, likely malignant lung mass.  Reviewed this finding with patient.  Discussed with his primary medicine team, family practice resident service, they will come evaluate patient and admit for further work-up.  Patient will need further specialty consultation, likely biopsy to work-up likely new malignant lung mass.   Final Clinical Impression(s) / ED Diagnoses Final diagnoses:  Lung mass  Lightheadedness    Rx / DC Orders ED Discharge Orders    None       Lucrezia Starch, MD 07/30/19 2227

## 2019-07-30 NOTE — ED Notes (Signed)
Pt to CT at this time.

## 2019-07-30 NOTE — ED Notes (Signed)
Contact person amy daughter  [phone]

## 2019-07-30 NOTE — ED Triage Notes (Signed)
Pt to ED via [facility]  Pt was at home and started feeling dizzy.  Pt reported that his is having his apt painted and he has been smelling the fumes and had not ate today.  EMS gave pt NS 1 liter and pt started to feel better and hypotension improved.  Pt st's he feels much better at this time

## 2019-07-30 NOTE — ED Notes (Signed)
PT given [place] sandwich and coffee

## 2019-07-30 NOTE — ED Notes (Signed)
Admitting MD at bedside at this time.

## 2019-07-31 DIAGNOSIS — I5032 Chronic diastolic (congestive) heart failure: Secondary | ICD-10-CM | POA: Diagnosis present

## 2019-07-31 DIAGNOSIS — K219 Gastro-esophageal reflux disease without esophagitis: Secondary | ICD-10-CM | POA: Diagnosis present

## 2019-07-31 DIAGNOSIS — Z66 Do not resuscitate: Secondary | ICD-10-CM | POA: Diagnosis not present

## 2019-07-31 DIAGNOSIS — R918 Other nonspecific abnormal finding of lung field: Secondary | ICD-10-CM | POA: Diagnosis present

## 2019-07-31 DIAGNOSIS — K860 Alcohol-induced chronic pancreatitis: Secondary | ICD-10-CM

## 2019-07-31 DIAGNOSIS — J449 Chronic obstructive pulmonary disease, unspecified: Secondary | ICD-10-CM

## 2019-07-31 DIAGNOSIS — C413 Malignant neoplasm of ribs, sternum and clavicle: Secondary | ICD-10-CM | POA: Diagnosis present

## 2019-07-31 DIAGNOSIS — I482 Chronic atrial fibrillation, unspecified: Secondary | ICD-10-CM | POA: Diagnosis not present

## 2019-07-31 DIAGNOSIS — D649 Anemia, unspecified: Secondary | ICD-10-CM | POA: Diagnosis present

## 2019-07-31 DIAGNOSIS — R1032 Left lower quadrant pain: Secondary | ICD-10-CM | POA: Diagnosis not present

## 2019-07-31 DIAGNOSIS — J9601 Acute respiratory failure with hypoxia: Secondary | ICD-10-CM | POA: Diagnosis present

## 2019-07-31 DIAGNOSIS — R627 Adult failure to thrive: Secondary | ICD-10-CM | POA: Diagnosis not present

## 2019-07-31 DIAGNOSIS — E861 Hypovolemia: Secondary | ICD-10-CM

## 2019-07-31 DIAGNOSIS — Z7901 Long term (current) use of anticoagulants: Secondary | ICD-10-CM | POA: Diagnosis not present

## 2019-07-31 DIAGNOSIS — I959 Hypotension, unspecified: Secondary | ICD-10-CM | POA: Diagnosis present

## 2019-07-31 DIAGNOSIS — I4819 Other persistent atrial fibrillation: Secondary | ICD-10-CM | POA: Diagnosis not present

## 2019-07-31 DIAGNOSIS — Z01810 Encounter for preprocedural cardiovascular examination: Secondary | ICD-10-CM | POA: Diagnosis not present

## 2019-07-31 DIAGNOSIS — R911 Solitary pulmonary nodule: Secondary | ICD-10-CM | POA: Diagnosis not present

## 2019-07-31 DIAGNOSIS — I4892 Unspecified atrial flutter: Secondary | ICD-10-CM | POA: Diagnosis present

## 2019-07-31 DIAGNOSIS — K5669 Other partial intestinal obstruction: Secondary | ICD-10-CM | POA: Diagnosis present

## 2019-07-31 DIAGNOSIS — R42 Dizziness and giddiness: Secondary | ICD-10-CM | POA: Diagnosis present

## 2019-07-31 DIAGNOSIS — K828 Other specified diseases of gallbladder: Secondary | ICD-10-CM | POA: Diagnosis not present

## 2019-07-31 DIAGNOSIS — J189 Pneumonia, unspecified organism: Secondary | ICD-10-CM | POA: Diagnosis present

## 2019-07-31 DIAGNOSIS — I9589 Other hypotension: Secondary | ICD-10-CM

## 2019-07-31 DIAGNOSIS — R222 Localized swelling, mass and lump, trunk: Secondary | ICD-10-CM

## 2019-07-31 DIAGNOSIS — I110 Hypertensive heart disease with heart failure: Secondary | ICD-10-CM | POA: Diagnosis present

## 2019-07-31 DIAGNOSIS — J440 Chronic obstructive pulmonary disease with acute lower respiratory infection: Secondary | ICD-10-CM | POA: Diagnosis present

## 2019-07-31 DIAGNOSIS — E785 Hyperlipidemia, unspecified: Secondary | ICD-10-CM | POA: Diagnosis present

## 2019-07-31 DIAGNOSIS — G8929 Other chronic pain: Secondary | ICD-10-CM | POA: Diagnosis present

## 2019-07-31 DIAGNOSIS — J9621 Acute and chronic respiratory failure with hypoxia: Secondary | ICD-10-CM | POA: Diagnosis not present

## 2019-07-31 DIAGNOSIS — E119 Type 2 diabetes mellitus without complications: Secondary | ICD-10-CM | POA: Diagnosis present

## 2019-07-31 DIAGNOSIS — C3412 Malignant neoplasm of upper lobe, left bronchus or lung: Secondary | ICD-10-CM | POA: Diagnosis present

## 2019-07-31 DIAGNOSIS — R0902 Hypoxemia: Secondary | ICD-10-CM | POA: Diagnosis not present

## 2019-07-31 DIAGNOSIS — J90 Pleural effusion, not elsewhere classified: Secondary | ICD-10-CM | POA: Diagnosis not present

## 2019-07-31 DIAGNOSIS — I4821 Permanent atrial fibrillation: Secondary | ICD-10-CM | POA: Diagnosis present

## 2019-07-31 DIAGNOSIS — K838 Other specified diseases of biliary tract: Secondary | ICD-10-CM | POA: Diagnosis not present

## 2019-07-31 DIAGNOSIS — Z20822 Contact with and (suspected) exposure to covid-19: Secondary | ICD-10-CM | POA: Diagnosis present

## 2019-07-31 DIAGNOSIS — E43 Unspecified severe protein-calorie malnutrition: Secondary | ICD-10-CM | POA: Diagnosis present

## 2019-07-31 DIAGNOSIS — Z515 Encounter for palliative care: Secondary | ICD-10-CM | POA: Diagnosis not present

## 2019-07-31 DIAGNOSIS — G893 Neoplasm related pain (acute) (chronic): Secondary | ICD-10-CM | POA: Diagnosis not present

## 2019-07-31 DIAGNOSIS — R0602 Shortness of breath: Secondary | ICD-10-CM | POA: Diagnosis not present

## 2019-07-31 DIAGNOSIS — I4891 Unspecified atrial fibrillation: Secondary | ICD-10-CM | POA: Diagnosis not present

## 2019-07-31 DIAGNOSIS — N179 Acute kidney failure, unspecified: Secondary | ICD-10-CM | POA: Diagnosis present

## 2019-07-31 DIAGNOSIS — R29898 Other symptoms and signs involving the musculoskeletal system: Secondary | ICD-10-CM | POA: Diagnosis not present

## 2019-07-31 LAB — URINALYSIS, ROUTINE W REFLEX MICROSCOPIC
Bilirubin Urine: NEGATIVE
Glucose, UA: NEGATIVE mg/dL
Hgb urine dipstick: NEGATIVE
Ketones, ur: NEGATIVE mg/dL
Leukocytes,Ua: NEGATIVE
Nitrite: NEGATIVE
Protein, ur: 30 mg/dL — AB
Specific Gravity, Urine: >1.046 — ABNORMAL HIGH (ref 1.005–1.030)
pH: 5 (ref 5.0–8.0)

## 2019-07-31 LAB — CBC
HCT: 24.3 % — ABNORMAL LOW (ref 39.0–52.0)
HCT: 26.9 % — ABNORMAL LOW (ref 39.0–52.0)
Hemoglobin: 7.5 g/dL — ABNORMAL LOW (ref 13.0–17.0)
Hemoglobin: 8.3 g/dL — ABNORMAL LOW (ref 13.0–17.0)
MCH: 27.8 pg (ref 26.0–34.0)
MCH: 27.6 pg (ref 26.0–34.0)
MCHC: 30.9 g/dL (ref 30.0–36.0)
MCHC: 30.9 g/dL (ref 30.0–36.0)
MCV: 90 fL (ref 80.0–100.0)
MCV: 89.4 fL (ref 80.0–100.0)
Platelets: 382 10*3/uL (ref 150–400)
Platelets: 396 10*3/uL (ref 150–400)
RBC: 2.7 MIL/uL — ABNORMAL LOW (ref 4.22–5.81)
RBC: 3.01 MIL/uL — ABNORMAL LOW (ref 4.22–5.81)
RDW: 15.6 % — ABNORMAL HIGH (ref 11.5–15.5)
RDW: 15.6 % — ABNORMAL HIGH (ref 11.5–15.5)
WBC: 21.3 10*3/uL — ABNORMAL HIGH (ref 4.0–10.5)
WBC: 24.6 10*3/uL — ABNORMAL HIGH (ref 4.0–10.5)
nRBC: 0.2 % (ref 0.0–0.2)
nRBC: 0.2 % (ref 0.0–0.2)

## 2019-07-31 LAB — TYPE AND SCREEN
ABO/RH(D): O POS
Antibody Screen: NEGATIVE

## 2019-07-31 LAB — RETICULOCYTES
Immature Retic Fract: 5.1 % (ref 2.3–15.9)
RBC.: 2.64 MIL/uL — ABNORMAL LOW (ref 4.22–5.81)
Retic Count, Absolute: 29.3 10*3/uL (ref 19.0–186.0)
Retic Ct Pct: 1.1 % (ref 0.4–3.1)

## 2019-07-31 LAB — COMPREHENSIVE METABOLIC PANEL
ALT: 15 U/L (ref 0–44)
AST: 27 U/L (ref 15–41)
Albumin: 1.8 g/dL — ABNORMAL LOW (ref 3.5–5.0)
Alkaline Phosphatase: 180 U/L — ABNORMAL HIGH (ref 38–126)
Anion gap: 8 (ref 5–15)
BUN: 29 mg/dL — ABNORMAL HIGH (ref 8–23)
CO2: 18 mmol/L — ABNORMAL LOW (ref 22–32)
Calcium: 7.5 mg/dL — ABNORMAL LOW (ref 8.9–10.3)
Chloride: 111 mmol/L (ref 98–111)
Creatinine, Ser: 1.12 mg/dL (ref 0.61–1.24)
GFR calc Af Amer: >60 mL/min (ref >60)
GFR calc non Af Amer: >60 mL/min (ref >60)
Glucose, Bld: 149 mg/dL — ABNORMAL HIGH (ref 70–99)
Potassium: 3.6 mmol/L (ref 3.5–5.1)
Sodium: 137 mmol/L (ref 135–145)
Total Bilirubin: 0.5 mg/dL (ref 0.3–1.2)
Total Protein: 5.1 g/dL — ABNORMAL LOW (ref 6.5–8.1)

## 2019-07-31 LAB — FOLATE: Folate: 6 ng/mL (ref >5.9)

## 2019-07-31 LAB — ABO/RH: ABO/RH(D): O POS

## 2019-07-31 LAB — FERRITIN: Ferritin: 160 ng/mL (ref 24–336)

## 2019-07-31 LAB — IRON AND TIBC
Iron: <5 ug/dL — ABNORMAL LOW (ref 45–182)
TIBC: 209 ug/dL — ABNORMAL LOW (ref 250–450)

## 2019-07-31 LAB — PROTIME-INR
INR: 2.1 — ABNORMAL HIGH (ref 0.8–1.2)
Prothrombin Time: 23 seconds — ABNORMAL HIGH (ref 11.4–15.2)

## 2019-07-31 LAB — CBG MONITORING, ED: Glucose-Capillary: 164 mg/dL — ABNORMAL HIGH (ref 70–99)

## 2019-07-31 LAB — SARS CORONAVIRUS 2 (TAT 6-24 HRS): SARS Coronavirus 2: NEGATIVE

## 2019-07-31 LAB — VITAMIN B12: Vitamin B-12: 1124 pg/mL — ABNORMAL HIGH (ref 180–914)

## 2019-07-31 LAB — APTT: aPTT: 49 seconds — ABNORMAL HIGH (ref 24–36)

## 2019-07-31 MED ORDER — SODIUM CHLORIDE 0.9 % IV BOLUS
1000.0000 mL | Freq: Once | INTRAVENOUS | Status: AC
  Administered 2019-07-31: 1000 mL via INTRAVENOUS

## 2019-07-31 MED ORDER — ALBUTEROL SULFATE (2.5 MG/3ML) 0.083% IN NEBU
3.0000 mL | INHALATION_SOLUTION | RESPIRATORY_TRACT | Status: DC | PRN
  Filled 2019-07-31: qty 3

## 2019-07-31 MED ORDER — METOPROLOL TARTRATE 25 MG PO TABS: 50.0000 mg | ORAL_TABLET | Freq: Two times a day (BID) | ORAL | Status: DC

## 2019-07-31 MED ORDER — PANCRELIPASE (LIP-PROT-AMYL) 12000-38000 UNITS PO CPEP
72000.0000 [IU] | ORAL_CAPSULE | Freq: Three times a day (TID) | ORAL | Status: DC
  Administered 2019-08-01 – 2019-08-09 (×18): 72000 [IU] via ORAL
  Filled 2019-07-31: qty 6
  Filled 2019-07-31: qty 2
  Filled 2019-07-31 (×10): qty 6
  Filled 2019-07-31: qty 2
  Filled 2019-07-31 (×8): qty 6

## 2019-07-31 MED ORDER — HEPARIN (PORCINE) 25000 UT/250ML-% IV SOLN
1200.0000 [IU]/h | INTRAVENOUS | Status: DC
  Administered 2019-07-31: 1200 [IU]/h via INTRAVENOUS
  Filled 2019-07-31: qty 250

## 2019-07-31 MED ORDER — SODIUM CHLORIDE 0.9 % IV SOLN
510.0000 mg | Freq: Once | INTRAVENOUS | Status: AC
  Administered 2019-07-31: 510 mg via INTRAVENOUS
  Filled 2019-07-31: qty 17

## 2019-07-31 MED ORDER — PANCRELIPASE (LIP-PROT-AMYL) 12000-38000 UNITS PO CPEP
36000.0000 [IU] | ORAL_CAPSULE | Freq: Two times a day (BID) | ORAL | Status: DC | PRN
  Filled 2019-07-31: qty 1

## 2019-07-31 MED ORDER — METOPROLOL TARTRATE 25 MG PO TABS: 25.0000 mg | ORAL_TABLET | Freq: Two times a day (BID) | ORAL | Status: DC

## 2019-07-31 NOTE — Progress Notes (Signed)
Family Medicine Teaching Service Daily Progress Note Intern Pager: [phone]  Patient name: Timothy Martinez Medical record number: [id number] Date of birth: [birth year] Age: 69 y.o. Gender: male  Primary Care Provider: [name], DO Consultants: CVTS   Code Status: Full   Pt Overview and Major Events to Date:  07/30/19: Admitted, CT Lung Mass with rib infiltration   Assessment and Plan: MURREL BERTRAM is a 69 y.o. male who presented dizziness and dyspnea. PMH is significant for chronic alcoholic pancreatitis, tobacco abuse, GERD, COPD, type II diabetes, AFIB, HFpEF, lumbar back pain, hyperlipidemia   Dyspnea  Lung Mass  Anemia  Patient at home yesterday with dizziness and reports 1 mon hx of SOB. Large primary pulmonary LUL mass (11.8 x 8.2 cm) identified on CT Chest with likely rib infiltration concerning for metastatic disease. Associated mediastinal and hilar adenopathy seen.  Patient continues to report left shoulder pain and was found to be anemic on admission. Hgb on admission 8.4 and today 8.3.About a month ago Hgb was ~11. Likely secondary to lung mass. Overnight, required an additional 1L bolus after receiving metoprolol for AFIB. On exam, lungs are clear.  Denies current SOB. Satting appropriately on room air. CVTS recommends video bronchoscopy tomorrow to better evaluate mass.  - Consulted CVTS, appreciate recommendations - Follow up video bronchoscopy w/ endobronchial [place]  - Vitals per unit routine  - Continuous pulse oximetry and telemetry  - PT / OT eval and treat  - Daily CBC  - Type and Screen ordered q72hr   AKI Cr 1.12 today, 0.76 on 12/4.  S/p 3x 1L bolus.   -Avoid nephrotoxic agents -IVF N.S 164ml/hour -CMP am  Left ear ulcer See media for image.  Concern for skin cancer given chronic nature of this ulceration and location on a frequently sun exposed area. Patient reports wound has been present for 2-3 months.  -Xerform dressing, per Wound RN consultation    -Dermatology outpatient referral   A. Fibrillation  HR 74-99 rate controlled, EKG: Rate controlled Afib. CHADsVASc score 3 Home meds: Metoprolol 75 mg BID, Xarelto 20 mg once daily - Cardiac Telemetry  - Hold Metoprolol, due to hypotension  - Hold Xarelto   Diabetes Mellitus Glucose 149 today, last A1c 6.1 10/31/2017 Home meds: Metformin 1 g BID -Hold Metformin, no need for sliding scale at present -Monitor CBGs daily    Hyperlipidemia Home meds: Atorvastatin 20 mg once daily -Lipid panel  -Continue statin  HFpEF EF 55-60%with G1 DD on echo in 12/2014. Euvolemic on exam. Follows with heart care, last seen in 2019 without concern.  -Monitor fluid status   COPD Continue to monitor respiratory status. Home meds: Dulera BID  -Continue Dulera -Albuterol PRN   GERD Home meds: Pantoprazole 40mg  once daily -Continue Protinix  - Pepcid 20 mg BID   Tobacco use Smokes 1 ppd since age 89, patient has cut down from 3 ppd -14 mg nicotine patch as needed  Chronic pancreatitis 2/2 previous ETOH excess CT abdomen 07/30/19: Stable pancreatic ductal dilatation and multiple calcifications. Previously seen pseudocyst in head of the pancreas poorly visualized. Home meds: Ensure supplements TID between meals, Norco/Vicodin 5-325 mg every 6 hourly, Creon 2 capsules 5 times daily, Zofran 4 mg every 8 hourly, Compazine 10 mg every 8 hourly -Continue home meds  Chronic lower back pain, chronic, stable Currently asymptomatic Home meds: hydrocodone, will continue - Scheduled Tylenol 650 mg q6h  - Norco 5mg  BID   Previous alcohol use: Stable Last drink was  1.5 years ago.  Encouraged patient to keep this up.  Hx of colonic polyps Three sessile polpys resected during colonoscopy in 2019. No evidence of malignancy   Constipation Home meds: MiraLAX, Magnesium oxide -Continue home meds  FEN/GI: Regular diet, Ensure TID, Multivitamin PPx: SCDs  Disposition: pending medical  clearance and PT/OT eval    Subjective:  Timothy Martinez is resting well in the ED in no acute distress.  Reports abdominal pain and shoulder pain.   Objective: Temp:  [97.3 F (36.3 C)] 97.3 F (36.3 C) (01/05 1710) Pulse Rate:  [74-99] 84 (01/06 0515) Resp:  [16-30] 25 (01/06 0645) BP: (77-149)/(43-104) 94/56 (01/06 0645) SpO2:  [93 %-100 %] 98 % (01/06 0515) Weight:  [74.4 kg] 74.4 kg (01/05 1713)   Physical Exam: GEN: well developed male in no acute distress CV: irregularly irregular rhythm, regular rhythm, no murmurs appreciated  RESP: no increased work of breathing, clear to ascultation bilaterally with no crackles, wheezes, or rhonchi  ABD: Bowel sounds active. Soft, mild generalized tenderness, nondistended.  MSK: no lower extremity edema, or cyanosis noted SKIN: warm, dry NEURO: grossly normal, moves all extremities appropriately PSYCH: Normal affect and thought content   Laboratory: Recent Labs  Lab 07/30/19 1719 07/31/19 0554  WBC 22.2* 21.3*  HGB 8.4* 7.5*  HCT 26.8* 24.3*  PLT 418* 382   Recent Labs  Lab 07/30/19 1719 07/31/19 0554  NA 136 137  K 4.1 3.6  CL 103 111  CO2 21* 18*  BUN 25* 29*  CREATININE 1.20 1.12  CALCIUM 8.1* 7.5*  PROT  --  5.1*  BILITOT  --  0.5  ALKPHOS  --  180*  ALT  --  15  AST  --  27  GLUCOSE 164* 149*      Imaging/Diagnostic Tests: CT Angio Chest PE W and/or Wo Contrast Result Date: 07/30/2019  IMPRESSION: Large left upper lobe mass lesion with apparent chest wall invasion in rib destruction as described consistent with a primary pulmonary neoplasm till proven otherwise. Associated mediastinal and hilar adenopathy is seen. Stable nodule in the right upper lobe unchanged from 2019. No evidence of pulmonary emboli. Hypodense lesion within the left lobe of the thyroid. No followup recommended (ref: J Am Coll Radiol. 2015 Feb;12(2): 143-50). Chronic changes in the pancreas consistent with chronic pancreatitis. Pancreatic  ductal dilatation in biliary ductal dilatation is seen. Chronic changes in the abdomen and pelvis stable from previous exams. Electronically Signed   By: Inez Catalina M.D.   On: 07/30/2019 20:17   CT ABDOMEN PELVIS W CONTRAST Result Date: 07/30/2019  IMPRESSION: Large left upper lobe mass lesion with apparent chest wall invasion in rib destruction as described consistent with a primary pulmonary neoplasm till proven otherwise. Associated mediastinal and hilar adenopathy is seen. Stable nodule in the right upper lobe unchanged from 2019. No evidence of pulmonary emboli. Hypodense lesion within the left lobe of the thyroid. No followup recommended (ref: J Am Coll Radiol. 2015 Feb;12(2): 143-50). Chronic changes in the pancreas consistent with chronic pancreatitis. Pancreatic ductal dilatation in biliary ductal dilatation is seen. Chronic changes in the abdomen and pelvis stable from previous exams. Electronically Signed   By: Inez Catalina M.D.   On: 07/30/2019 20:17   DG Chest Portable 1 View Result Date: 07/30/2019  IMPRESSION: 1. Large opacity within the left upper lobe suspicious for the presence of a large lung mass. The presence of adjacent cortical destruction of the third left rib is suggestive of malignant  properties. Correlation with chest CT is recommended. Electronically Signed   By: Virgina Norfolk M.D.   On: 07/30/2019 17:57     [name], DO PGY-1, [facility] 07/31/2019 8:18 AM    FPTS Intern pager: [phone], text pages welcome

## 2019-07-31 NOTE — ED Notes (Signed)
Checked patient cbg it was 50 notified RN Gabe of blood sugar patient is resting with call bell in reach

## 2019-07-31 NOTE — Consult Note (Signed)
WOC Nurse Consult Note: Reason for Consult: Full thickness tissue loss to left ear (auricle).  Patient reports picking "scab" and healing it in the past with neosporin. Wound type: Traumatic vs neoplastic Pressure Injury POA: NA Measurement:1.8cm x 0.4cm x 0.1cm Wound bed: red, moist Drainage (amount, consistency, odor) small amount serous Periwound: erythematous Dressing procedure/placement/frequency: I will implement a conservative POC using xeroform for its astringent and antimicrobial properties.  Patient is encouraged to show the area to his PCP at his next visit.  [facility] nursing team will not follow, but will remain available to this patient, the nursing and medical teams.  Please re-consult if needed. Thanks, Maudie Flakes, MSN, RN, [place], Arther Abbott  Pager# [phone]

## 2019-07-31 NOTE — Discharge Summary (Addendum)
[facility] Discharge Summary  Patient name: Timothy Martinez Medical record number: [id number] Date of birth: Nov 10, 1950 Age: 69 y.o. Gender: male Date of Admission: 07/30/2019  Date of Discharge: 08/09/19  Admitting Physician: Martyn Malay, MD  Primary Care Provider: [name], DO Consultants: CVTS, Cardiology, IR   Indication for Hospitalization: Hypotension, shortness of breath, new lung mass  Discharge Diagnoses/Problem List:  Acute hypoxic respiratory failure, improving  Acute kidney injury, resolved  Obstructive pneumonia  Lung mass  Anemoa Atrial fibrillation Tobacco abuse COPD Left ear ulcer T2DM, well controlled Chronic pancreatitis History of alcohol abuse Tobacco use  Hyperlipidemia  HFpEF GERD  Disposition: Home   Discharge Condition: Stable and improving  Discharge Exam:   GEN: pleasant male in no acute distress, smiles on exam  CV: regular rate and rhythm, no murmurs appreciated  RESP: no increased work of breathing, clear to ascultation bilaterally with no crackles, wheezes, or rhonchi  ABD: Bowel sounds present. Soft, Nontender, Nondistended.  MSK: no lower extremity edema, or cyanosis noted SKIN: warm, dry NEURO: grossly normal, moves all extremities appropriately PSYCH: Normal affect and thought content Taken from Dr. Birdena Crandall progress note from 08/09/19 on the day of discharge  Brief Hospital Course:   Lung mass  Acute Hypoxic Respiratory failure  Obstructive pneumonia  Timothy Martinez is a 69 y.o. male who presented to the emergency department due to dizziness and shortness of breath and was found to be hypotensive and anemic.  However, most concerning was a large mass in the left upper lobe of the lung that was concerning for malignancy.  He was admitted for management of his hypotension and to start the process of diagnosing his lung mass. He continued to be hypotensive after initial improvement with fluid, so  an additional 3 L were given shortly after admission.  His blood pressure normalized.  During the night of 1/6-1/7, patient became progressively hypoxic. On admission, he did not require oxygen but progressively needed more oxygen until he was on 10 L HFNC.  Patient was weaned to 4 L prior to discharge.  DME orders were placed for home oxygen after he desatted with physical therapy.  Regarding his invasive destructive lung mass, CVTS was consulted, and who recommended bronchoscopy and endobronchial ultrasound.  Patient was scheduled for this earlier in his admission however he became dynamically unstable and this procedure was delayed.  Patient had bronchoscopy on 08/06/2019 however bronch cytologies and pathology did not give a definitive diagnosis. Atypical cells were seen but not enough to call carcinoma.  Patient underwent CT-guided needle biopsy on 08/08/19.  Results are pending. Patient to complete 14-day course of antibiotics (Levaquin) on 08/14/19 for concerns of obstructive pneumonia seen on imaging.  Palliative care was consulted and will continue to follow patient outpatient.  AFIB with RVR  A Flutter  On admission, patient's Lopressor was also held due to hypotension.  As his blood pressure normalized, he developed atrial fibrillation with RVR on the evening of 1/6 and required multiple doses of IV metoprolol.  On 08/02/2019, his rhythm converted to a 2:1 A flutter that resolved with IV amiodarone.  He remained in sinus rhythm throughout the remainder of his admission.  Patient had loading dose of 400 mg amiodarone for 7 days and discharged with instructions to take 200 mg amiodarone daily therafter.   Anemia Patient found to be anemic to 8.4 on admission, which was significantly lower than his hemoglobin of 11.2 one month prior.  Anemia panel  indicated a low iron level and low TIBC, which indicated possible multiple causes of his anemia.  He was given a dose of IV Feraheme on 1/6.  Hemoglobin  remained stable throughout this admission.  On the day of discharge his hemoglobin was 9.3.    Issues for Follow Up:  Follow-up on pathology results regarding lung mass. Patient will need oncology/pulmonology follow-up.  Referrals were placed at discharge.  Please follow-up with patient regarding these appointments. Reassess respiratory status at follow-up. DME orders were placed for PT, OT, RN, aide, shower chair and home oxygen.  Check in with patient regarding additional needs and provide emotional support, if needed.     Significant Procedures: Bronchoscopy, CT-guided needle biopsy  Significant Labs and Imaging:  Recent Labs  Lab 08/07/19 0324 08/08/19 0319 08/09/19 0221  WBC 18.9* 21.2* 27.2*  HGB 8.3* 9.1* 9.3*  HCT 24.7* 27.6* 28.5*  PLT 355 400 449*   Recent Labs  Lab 08/05/19 0354 08/05/19 0354 08/06/19 0729 08/06/19 0729 08/07/19 0324 08/07/19 0324 08/08/19 0319 08/09/19 0221  NA 141  --  142  --  141  --  138 139  K 4.3   < > 4.1   < > 4.3   < > 3.9 3.6  CL 104  --  104  --  104  --  101 101  CO2 30  --  30  --  29  --  29 28  GLUCOSE 106*  --  182*  --  230*  --  132* 114*  BUN 13  --  22  --  23  --  14 19  CREATININE 0.81  --  0.71  --  0.77  --  0.71 0.73  CALCIUM 8.2*  --  8.4*  --  8.3*  --  8.1* 8.4*  MG 1.7  --   --   --  1.8  --   --   --   ALKPHOS 244*  --  221*  --  179*  --  185* 157*  AST 31  --  35  --  40  --  33 21  ALT 18  --  18  --  22  --  23 18  ALBUMIN 1.4*  --  1.4*  --  1.5*  --  1.6* 1.5*   < > = values in this interval not displayed.      Results/Tests Pending at Time of Discharge: Biopsy pathology results   Discharge Medications:  Allergies as of 08/09/2019       Reactions   Prozac [fluoxetine Hcl] Nausea Only   Made the patient very sick to his stomach   Morphine And Related Swelling   Pt states he can tolerate oxycodone and hydromorphone   Penicillins Rash   Tolerated cefazolin 2021 Has patient had a PCN reaction  causing immediate rash, facial/tongue/throat swelling, SOB or lightheadedness with hypotension: Yes Has patient had a PCN reaction causing severe rash involving mucus membranes or skin necrosis: No Has patient had a PCN reaction that required hospitalization: No Has patient had a PCN reaction occurring within the last 10 years: No If all of the above answers are "NO", then may proceed with Cephalosporin use.        Medication List     STOP taking these medications    HYDROcodone-acetaminophen 7.5-325 MG tablet Commonly known as: [place] these medications    amiodarone 200 MG tablet Commonly known as: PACERONE Take  2 tablets (400 mg total) by mouth 2 (two) times daily for 1 day, THEN 1 tablet (200 mg total) daily for 28 days. Start taking on: August 09, 2019   amitriptyline 10 MG tablet Commonly known as: ELAVIL TAKE 1 TABLET BY MOUTH EVERYDAY AT BEDTIME What changed: See the new instructions.   atorvastatin 40 MG tablet Commonly known as: LIPITOR Take 0.5 tablets (20 mg total) by mouth daily at 6 PM. What changed: how much to take   famotidine 20 MG tablet Commonly known as: PEPCID TAKE 1 TABLET BY MOUTH TWICE A DAY What changed: when to take this   feeding supplement (ENSURE ENLIVE) Liqd Take 237 mLs by mouth 3 (three) times daily between meals.   gabapentin 100 MG capsule Commonly known as: NEURONTIN TAKE 1 CAPSULE BY MOUTH THREE TIMES A DAY What changed:  how much to take how to take this when to take this additional instructions   glucose blood test strip Commonly known as: ONE TOUCH ULTRA TEST Use check fasting blood glucose qAM. Type 2 Diabetes E11.9   levofloxacin 750 MG tablet Commonly known as: LEVAQUIN Take 1 tablet (750 mg total) by mouth daily for 5 days.   lipase/protease/amylase 36000 UNITS Cpep capsule Commonly known as: CREON Take 36,000-72,000 capsules by mouth See admin instructions. 2 caps with each meal, 1 capsule with  snacks   metFORMIN 1000 MG tablet Commonly known as: GLUCOPHAGE Take 1 tablet (1,000 mg total) by mouth 2 (two) times daily with a meal.   metoprolol tartrate 50 MG tablet Commonly known as: LOPRESSOR Take 1.5 tablets (75 mg total) by mouth 2 (two) times daily.   mometasone-formoterol 200-5 MCG/ACT Aero Commonly known as: DULERA Inhale 2 puffs into the lungs 2 (two) times daily.   multivitamin with minerals Tabs tablet Take 1 tablet by mouth daily.   naproxen sodium 220 MG tablet Commonly known as: ALEVE Take 880 mg by mouth 2 (two) times daily as needed (pain).   nitroGLYCERIN 0.4 MG SL tablet Commonly known as: Nitrostat Place 1 tablet (0.4 mg total) under the tongue every 5 (five) minutes as needed for chest pain.   ondansetron 4 MG disintegrating tablet Commonly known as: Zofran ODT Take 1 tablet (4 mg total) by mouth every 8 (eight) hours as needed for nausea or vomiting.   ONE TOUCH ULTRA 2 w/Device Kit Dispense one glucometer. Diagnosis Type 2 Diabetes. E11.9   onetouch ultrasoft lancets Use as instructed   freestyle lancets Use as instructed   oxyCODONE 5 MG immediate release tablet Commonly known as: Oxy IR/ROXICODONE Take 1 tablet (5 mg total) by mouth every 6 (six) hours as needed for severe pain.   pantoprazole 40 MG tablet Commonly known as: PROTONIX Take 1 tablet (40 mg total) by mouth daily.   polyethylene glycol 17 g packet Commonly known as: MIRALAX / GLYCOLAX TAKE 17 G BY MOUTH DAILY. What changed:  when to take this reasons to take this   prochlorperazine 10 MG tablet Commonly known as: COMPAZINE Take 1 tablet (10 mg total) by mouth every 8 (eight) hours as needed for nausea or vomiting.   tamsulosin 0.4 MG Caps capsule Commonly known as: FLOMAX Take 1 capsule (0.4 mg total) by mouth daily.   Xarelto 20 MG Tabs tablet Generic drug: rivaroxaban TAKE 1 TABLET BY MOUTH DAILY WITH SUPPER What changed: See the new instructions.         Discharge Instructions: Please refer to Patient Instructions section of EMR for full details.  Patient was counseled important signs and symptoms that should prompt return to medical care, changes in medications, dietary instructions, activity restrictions, and follow up appointments.   Follow-Up Appointments: Follow-up Information     [facility] Follow up.   Why: [facility]- Palliative Services- office to call the patient once he transitions home.  Contact information: [place] [address] [phone]         [facility] Follow up.   Why: Oxygen, Shower Stool, to be delivered to the room prior to transition home.  Contact information: [place] [address] [phone]         [facility] Follow up.   Specialty: Home Health Services Why: Registered Nurse, Physical Therapy, Occupational Therapy- Office to call the patient with a visit time.  Contact information: [address] [place] [address] [phone]         [name], DO. Go on 08/19/2019.   Specialty: Family Medicine Why: at 1:30 PM  Contact information: [address] [address] [phone]         Nahser, Wonda Cheng, MD .   Specialty: Cardiology Contact information: [place] [address] [phone]         [facility]. Schedule an appointment as soon as possible for a visit.   Specialty: Pulmonology Contact information: [address] [place] [phone] [place]. Schedule an appointment as soon as possible for a visit.             [name], DO 08/10/2019, 2:35 PM PGY-1, [facility]

## 2019-07-31 NOTE — Progress Notes (Signed)
RT called patient O2 STATs 88% on 5liters nasal cannula

## 2019-07-31 NOTE — ED Notes (Signed)
ED TO INPATIENT HANDOFF REPORT  ED Nurse Name and Phone #:  Chester Holstein [phone]  S Name/Age/Gender Timothy Martinez 69 y.o. male Room/Bed: 056C/056C  Code Status   Code Status: Full Code  Home/SNF/Other Home Patient oriented to: self, place, time and situation Is this baseline? Yes   Triage Complete: Triage complete  Chief Complaint Mass of upper lobe of left lung [R91.8] Lung mass [R91.8]  Triage Note Pt to ED via [facility]  Pt was at home and started feeling dizzy.  Pt reported that his is having his apt painted and he has been smelling the fumes and had not ate today.  EMS gave pt NS 1 liter and pt started to feel better and hypotension improved.  Pt st's he feels much better at this time    Allergies Allergies  Allergen Reactions  . Prozac [Fluoxetine Hcl] Nausea Only    Made the patient very sick to his stomach  . Morphine And Related Swelling    Pt states he can tolerate oxycodone and hydromorphone  . Penicillins Rash    Has patient had a PCN reaction causing immediate rash, facial/tongue/throat swelling, SOB or lightheadedness with hypotension: Yes Has patient had a PCN reaction causing severe rash involving mucus membranes or skin necrosis: No Has patient had a PCN reaction that required hospitalization: No Has patient had a PCN reaction occurring within the last 10 years: No If all of the above answers are "NO", then may proceed with Cephalosporin use.     Level of Care/Admitting Diagnosis ED Disposition    ED Disposition Condition [facility] Area: [place] [[id number]]  Level of Care: Telemetry Medical [[id number]]  Covid Evaluation: Confirmed COVID Negative  Diagnosis: Lung mass [208903]  Admitting Physician: Martyn Malay [[id number]]  Attending Physician: Martyn Malay [[id number]]  Estimated length of stay: past midnight tomorrow  Certification:: I certify this patient will need inpatient services for at least 2 midnights        B Medical/Surgery History Past Medical History:  Diagnosis Date  . Acute on chronic pancreatitis ([place]) 05/17/2016  . Alcohol abuse   . Alcoholic pancreatitis   . Anginal pain ([place])   . Anxiety and depression 01/20/2015   PHQ 9 = 11 (01/20/15)    . Arthritis    "eat up w/it" (05/17/2016)  . Atrial fibrillation status post cardioversion ([place]) 08/2014  . Atrial flutter, unspecified   . CHF (congestive heart failure) ([place])   . Chronic anticoagulation   . Chronic atrial fibrillation ([place])   . Chronic bronchitis ([place])   . Chronic left-sided low back pain with sciatica 12/21/2012  . Chronic pancreatitis ([place])   . COLD (chronic obstructive lung disease) ([place])   . Complication of anesthesia    pt C/O confusion after procedure in April 2020  . Daily headache    "need eye examined" (05/17/2016)  . Gastric outlet obstruction 10/2018  . GERD (gastroesophageal reflux disease)   . Hematuria, microscopic 02/13/2015   Noted on UA - Recheck UA at next visit ~ 1 months   . Hyperlipidemia   . Hypertension   . Insomnia 02/11/2016  . Lateral epicondylitis of right elbow 03/17/2017  . Left foot pain 10/12/2013  . Loss of weight 02/19/2018  . Low back pain 12/21/2012  . Malnutrition of moderate degree 05/18/2016  . Neck pain, bilateral posterior 10/15/2014  . Neuropathy 03/16/2015  . Pain of joint of left ankle and foot 06/13/2018  . Pain  of right upper extremity 05/30/2016  . Pancreatic pseudocyst    seen on CT scan 07/2014  . Pancreatitis 08/04/2014  . Permanent atrial fibrillation ([place]) 05/05/2017  . Pneumonia    "?a few times" (05/17/2016)  . 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 Tobacco abuse 05/30/2011   Heavy smoker up to 3 ppd down to 15 cigs per day in 09/2014   . Type II diabetes mellitus ([place])   . Wears dentures   . Wears glasses    Past Surgical History:  Procedure Laterality Date  . BACK SURGERY    . BIOPSY  11/20/2018   Procedure: BIOPSY;  Surgeon: Rush Landmark Telford Nab., MD;  Location: [facility];  Service: Gastroenterology;;  . BIOPSY  02/04/2019   Procedure: BIOPSY;  Surgeon: Irving Copas., MD;  Location: [facility];  Service: Gastroenterology;;  . CARDIOVERSION N/A 09/22/2014   Procedure: CARDIOVERSION;  Surgeon: Thayer Headings, MD;  Location: [facility];  Service: Cardiovascular;  Laterality: N/A;  . COLONOSCOPY W/ POLYPECTOMY  2019  . ERCP N/A 02/04/2019   Procedure: ENDOSCOPIC RETROGRADE CHOLANGIOPANCREATOGRAPHY (ERCP);  Surgeon: Irving Copas., MD;  Location: [facility];  Service: Gastroenterology;  Laterality: N/A;  . ESOPHAGOGASTRODUODENOSCOPY (EGD) WITH PROPOFOL N/A 11/20/2018   Procedure: ESOPHAGOGASTRODUODENOSCOPY (EGD) WITH PROPOFOL;  Surgeon: Rush Landmark Telford Nab., MD;  Location: [facility];  Service: Gastroenterology;  Laterality: N/A;  . ESOPHAGOGASTRODUODENOSCOPY (EGD) WITH PROPOFOL N/A 02/04/2019   Procedure: ESOPHAGOGASTRODUODENOSCOPY (EGD) WITH PROPOFOL;  Surgeon: Rush Landmark Telford Nab., MD;  Location: [facility];  Service: Gastroenterology;  Laterality: N/A;  . FOREIGN BODY REMOVAL  11/20/2018   Procedure: FOREIGN BODY REMOVAL;  Surgeon: Rush Landmark Telford Nab., MD;  Location: [facility];  Service: Gastroenterology;;  . [place]   not cervical, states had lesion on neck which was removed  . POSTERIOR LAMINECTOMY / DECOMPRESSION LUMBAR SPINE  10/2006   of L4, L5 and S1 with a 5-1 diskectomy, microdissection with the microscope/notes 10/24/2006  . SPLENECTOMY  2003  . UPPER ESOPHAGEAL ENDOSCOPIC ULTRASOUND (EUS) N/A 11/20/2018   Procedure: UPPER ESOPHAGEAL ENDOSCOPIC ULTRASOUND (EUS);  Surgeon: Irving Copas., MD;  Location: [facility];  Service: Gastroenterology;  Laterality: N/A;  . UPPER ESOPHAGEAL ENDOSCOPIC ULTRASOUND (EUS) N/A 02/04/2019   Procedure: UPPER ESOPHAGEAL ENDOSCOPIC ULTRASOUND (EUS);  Surgeon: Irving Copas., MD;  Location: [facility];  Service: Gastroenterology;  Laterality: N/A;      A IV Location/Drains/Wounds Patient Lines/Drains/Airways Status   Active Line/Drains/Airways    Name:   Placement date:   Placement time:   Site:   Days:   Peripheral IV 12/29/18 Right Forearm   12/29/18    1330    Forearm   214   Peripheral IV 01/07/19 Left Antecubital   01/07/19    0915    Antecubital   205   Peripheral IV 07/15/19 Left Forearm   07/15/19    0720    Forearm   16   Peripheral IV 07/30/19 Left Antecubital   07/30/19    --    Antecubital   1          Intake/Output Last 24 hours  Intake/Output Summary (Last 24 hours) at 07/31/2019 1447 Last data filed at 07/31/2019 0701 Gross per 24 hour  Intake 4066.8 ml  Output --  Net 4066.8 ml    Labs/Imaging Results for orders placed or performed during the hospital encounter of 07/30/19 (from the past 48 hour(s))  Brain natriuretic peptide     Status: Abnormal   Collection  Time: 07/30/19  5:14 PM  Result Value Ref Range   B Natriuretic Peptide 386.3 (H) 0.0 - 100.0 pg/mL    Comment: Performed at [facility] [address] [place], [place] [address]  CBC with Differential     Status: Abnormal   Collection Time: 07/30/19  5:19 PM  Result Value Ref Range   WBC 22.2 (H) 4.0 - 10.5 K/uL   RBC 3.03 (L) 4.22 - 5.81 MIL/uL   Hemoglobin 8.4 (L) 13.0 - 17.0 g/dL   HCT 26.8 (L) 39.0 - 52.0 %   MCV 88.4 80.0 - 100.0 fL   MCH 27.7 26.0 - 34.0 pg   MCHC 31.3 30.0 - 36.0 g/dL   RDW 14.9 11.5 - 15.5 %   Platelets 418 (H) 150 - 400 K/uL   nRBC 0.1 0.0 - 0.2 %   Neutrophils Relative % 85 %   Neutro Abs 18.8 (H) 1.7 - 7.7 K/uL   Lymphocytes Relative 5 %   Lymphs Abs 1.1 0.7 - 4.0 K/uL   Monocytes Relative 9 %   Monocytes Absolute 2.0 (H) 0.1 - 1.0 K/uL   Eosinophils Relative 0 %   Eosinophils Absolute 0.1 0.0 - 0.5 K/uL   Basophils Relative 0 %   Basophils Absolute 0.1 0.0 - 0.1 K/uL   Immature Granulocytes 1 %   Abs Immature Granulocytes 0.10 (H) 0.00 - 0.07 K/uL    Comment: Performed at [facility] [address], [place], [place] [address]  Basic metabolic panel     Status: Abnormal   Collection Time: 07/30/19  5:19 PM  Result Value Ref Range   Sodium 136 135 - 145 mmol/L   Potassium 4.1 3.5 - 5.1 mmol/L   Chloride 103 98 - 111 mmol/L   CO2 21 (L) 22 - 32 mmol/L   Glucose, Bld 164 (H) 70 - 99 mg/dL   BUN 25 (H) 8 - 23 mg/dL   Creatinine, Ser 1.20 0.61 - 1.24 mg/dL   Calcium 8.1 (L) 8.9 - 10.3 mg/dL   GFR calc non Af Amer >60 >60 mL/min   GFR calc Af Amer >60 >60 mL/min   Anion gap 12 5 - 15    Comment: Performed at [facility], [place] [address], [place], [place] [address]  Troponin I (High Sensitivity)     Status: None   Collection Time: 07/30/19  5:19 PM  Result Value Ref Range   Troponin I (High Sensitivity) 12 <18 ng/L    Comment: (NOTE) Elevated high sensitivity troponin I (hsTnI) values and significant  changes across serial measurements may suggest ACS but many other  chronic and acute conditions are known to elevate hsTnI results.  Refer to the "Links" section for chest pain algorithms and additional  guidance. Performed at [facility], [place] [address], [place], [place] [address]   Troponin I (High Sensitivity)     Status: None   Collection Time: 07/30/19  7:24 PM  Result Value Ref Range   Troponin I (High Sensitivity) 12 <18 ng/L    Comment: (NOTE) Elevated high sensitivity troponin I (hsTnI) values and significant  changes across serial measurements may suggest ACS but many other  chronic and acute conditions are known to elevate hsTnI results.  Refer to the "Links" section for chest pain algorithms and additional  guidance. Performed at [facility], [place] [address], [place], [place] [address]   SARS CORONAVIRUS 2 (TAT 6-24 HRS) Nasopharyngeal Nasopharyngeal Swab  Status: None   Collection Time: 07/30/19  8:34 PM   Specimen: Nasopharyngeal Swab  Result Value Ref Range   SARS Coronavirus 2 NEGATIVE NEGATIVE    Comment: (NOTE) SARS-CoV-2  target nucleic acids are NOT DETECTED. The SARS-CoV-2 RNA is generally detectable in upper and lower respiratory specimens during the acute phase of infection. Negative results do not preclude SARS-CoV-2 infection, do not rule out co-infections with other pathogens, and should not be used as the sole basis for treatment or other patient management decisions. Negative results must be combined with clinical observations, patient history, and epidemiological information. The expected result is Negative. Fact Sheet for Patients: SugarRoll.be Fact Sheet for Healthcare Providers: https://www.woods-mathews.com/ This test is not yet approved or cleared by the [place] FDA and  has been authorized for detection and/or diagnosis of SARS-CoV-2 by FDA under an Emergency Use Authorization (EUA). This EUA will remain  in effect (meaning this test can be used) for the duration of the COVID-19 declaration under Section 56 4(b)(1) of the Act, 21 U.S.C. section 360bbb-3(b)(1), unless the authorization is terminated or revoked sooner. Performed at [facility], [place] [address] [place], [place] [address]   Vitamin B12     Status: Abnormal   Collection Time: 07/31/19 12:02 AM  Result Value Ref Range   Vitamin B-12 1,124 (H) 180 - 914 pg/mL    Comment: Performed at [facility], [place] [address], [place], [place] [address]  Folate     Status: None   Collection Time: 07/31/19 12:02 AM  Result Value Ref Range   Folate 6.0 >5.9 ng/mL    Comment: Performed at [facility], [place] [address] [place], [place] [address]  Iron and TIBC     Status: Abnormal   Collection Time: 07/31/19 12:02 AM  Result Value Ref Range   Iron <5 (L) 45 - 182 ug/dL    Comment: REPEATED TO VERIFY   TIBC 209 (L) 250 - 450 ug/dL   Saturation Ratios NOT CALCULATED 17.9 - 39.5 %   UIBC NOT CALCULATED ug/dL    Comment: Performed at [facility] [address],  [place], [place] [address]  Ferritin     Status: None   Collection Time: 07/31/19 12:02 AM  Result Value Ref Range   Ferritin 160 24 - 336 ng/mL    Comment: Performed at [facility], [place] [address] [place], [place] [address]  Reticulocytes     Status: Abnormal   Collection Time: 07/31/19 12:02 AM  Result Value Ref Range   Retic Ct Pct 1.1 0.4 - 3.1 %   RBC. 2.64 (L) 4.22 - 5.81 MIL/uL   Retic Count, Absolute 29.3 19.0 - 186.0 K/uL   Immature Retic Fract 5.1 2.3 - 15.9 %    Comment: Performed at [facility] [address] [place], [place] [address]  Urinalysis, Routine w reflex microscopic     Status: Abnormal   Collection Time: 07/31/19  2:35 AM  Result Value Ref Range   Color, Urine AMBER (A) YELLOW    Comment: BIOCHEMICALS MAY BE AFFECTED BY COLOR   APPearance HAZY (A) CLEAR   Specific Gravity, Urine >1.046 (H) 1.005 - 1.030   pH 5.0 5.0 - 8.0   Glucose, UA NEGATIVE NEGATIVE mg/dL   Hgb urine dipstick NEGATIVE NEGATIVE   Bilirubin Urine NEGATIVE NEGATIVE   Ketones, ur NEGATIVE NEGATIVE mg/dL   Protein, ur 30 (A) NEGATIVE mg/dL   Nitrite NEGATIVE  NEGATIVE   Leukocytes,Ua NEGATIVE NEGATIVE   RBC / HPF 0-5 0 - 5 RBC/hpf   WBC, UA 0-5 0 - 5 WBC/hpf   Bacteria, UA MANY (A) NONE SEEN   Squamous Epithelial / LPF 0-5 0 - 5   Mucus PRESENT    Hyaline Casts, UA PRESENT    Sperm, UA PRESENT     Comment: Performed at [facility], [place] [address] [place], [place] [address]  Comprehensive metabolic panel     Status: Abnormal   Collection Time: 07/31/19  5:54 AM  Result Value Ref Range   Sodium 137 135 - 145 mmol/L   Potassium 3.6 3.5 - 5.1 mmol/L   Chloride 111 98 - 111 mmol/L   CO2 18 (L) 22 - 32 mmol/L   Glucose, Bld 149 (H) 70 - 99 mg/dL   BUN 29 (H) 8 - 23 mg/dL   Creatinine, Ser 1.12 0.61 - 1.24 mg/dL   Calcium 7.5 (L) 8.9 - 10.3 mg/dL   Total Protein 5.1 (L) 6.5 - 8.1 g/dL   Albumin 1.8 (L) 3.5 - 5.0 g/dL   AST 27 15 - 41 U/L   ALT 15 0 - 44 U/L    Alkaline Phosphatase 180 (H) 38 - 126 U/L   Total Bilirubin 0.5 0.3 - 1.2 mg/dL   GFR calc non Af Amer >60 >60 mL/min   GFR calc Af Amer >60 >60 mL/min   Anion gap 8 5 - 15    Comment: Performed at [facility], [place] [address] [place], [place] [address]  CBC     Status: Abnormal   Collection Time: 07/31/19  5:54 AM  Result Value Ref Range   WBC 21.3 (H) 4.0 - 10.5 K/uL   RBC 2.70 (L) 4.22 - 5.81 MIL/uL   Hemoglobin 7.5 (L) 13.0 - 17.0 g/dL   HCT 24.3 (L) 39.0 - 52.0 %   MCV 90.0 80.0 - 100.0 fL   MCH 27.8 26.0 - 34.0 pg   MCHC 30.9 30.0 - 36.0 g/dL   RDW 15.6 (H) 11.5 - 15.5 %   Platelets 382 150 - 400 K/uL   nRBC 0.2 0.0 - 0.2 %    Comment: Performed at [facility], [place] [address] [place], [place] [address]  Type and screen [place]     Status: None   Collection Time: 07/31/19  8:41 AM  Result Value Ref Range   ABO/RH(D) O POS    Antibody Screen NEG    Sample Expiration      08/03/2019,2359 Performed at [facility], [place] [address] [place], [place] [address]   CBC     Status: Abnormal   Collection Time: 07/31/19  8:41 AM  Result Value Ref Range   WBC 24.6 (H) 4.0 - 10.5 K/uL   RBC 3.01 (L) 4.22 - 5.81 MIL/uL   Hemoglobin 8.3 (L) 13.0 - 17.0 g/dL   HCT 26.9 (L) 39.0 - 52.0 %   MCV 89.4 80.0 - 100.0 fL   MCH 27.6 26.0 - 34.0 pg   MCHC 30.9 30.0 - 36.0 g/dL   RDW 15.6 (H) 11.5 - 15.5 %   Platelets 396 150 - 400 K/uL   nRBC 0.2 0.0 - 0.2 %    Comment: Performed at [facility], [place] [address], [place],  [address]  ABO/Rh     Status: None   Collection Time: 07/31/19  8:41 AM  Result Value Ref Range  ABO/RH(D)      O POS Performed at [facility], [place] [address] [place], [place] [address]   CBG monitoring, ED     Status: Abnormal   Collection Time: 07/31/19 12:28 PM  Result Value Ref Range   Glucose-Capillary 164 (H) 70 - 99 mg/dL   CT Angio Chest PE W and/or Wo Contrast  Result Date: 07/30/2019 CLINICAL  DATA:  Shortness of breath and near syncopal episode, mass lesion on recent chest x-ray EXAM: CT ANGIOGRAPHY CHEST CT ABDOMEN AND PELVIS WITH CONTRAST TECHNIQUE: Multidetector CT imaging of the chest was performed using the standard protocol during bolus administration of intravenous contrast. Multiplanar CT image reconstructions and MIPs were obtained to evaluate the vascular anatomy. Multidetector CT imaging of the abdomen and pelvis was performed using the standard protocol during bolus administration of intravenous contrast. CONTRAST:  12mL OMNIPAQUE IOHEXOL 350 MG/ML SOLN COMPARISON:  Prior chest x-ray from earlier in the same day CT from 03/30/2018. FINDINGS: CTA CHEST FINDINGS Cardiovascular: Thoracic aorta demonstrates atherosclerotic calcifications without aneurysmal dilatation. No cardiac enlargement is noted. Mild coronary calcifications are noted. The pulmonary artery is well visualized within normal enhancement pattern. No filling defect to suggest pulmonary embolism is seen. Mediastinum/Nodes: Thoracic inlet shows a hypodensity within the left lobe of the thyroid. This measures approximately 14 mm in greatest dimension. The esophagus as visualized is within normal limits. Scattered small mediastinal lymph nodes are noted primarily within the AP window. Small hilar adenopathy is noted bilaterally. Lungs/Pleura: In the left upper lobe there is a focal soft tissue mass lesion which measures approximately 11.8 x 8.2 cm in greatest AP and transverse dimensions respectively. It has some extension to the left hilum and there are associated changes involving the posterolateral aspect of the left third rib consistent with localized invasion and metastatic disease. There also appears to be soft tissue density between the third and fourth ribs laterally consistent with chest wall invasion. Emphysematous changes are noted with evidence of subpleural fibrosis similar to that seen on prior chest x-rays. 7 mm nodule  is noted in the right upper lobe best seen on image number 76 of series 4. This is stable from a prior CT examination. Musculoskeletal: As described above there are destructive changes of the posterolateral aspect of the left third rib consistent with localized neoplastic invasion. No other specific rib abnormality is noted. The thoracic spine demonstrates degenerative change without lytic lesion or compression deformity. Review of the MIP images confirms the above findings. CT ABDOMEN and PELVIS FINDINGS Hepatobiliary: Gallbladder is well distended. Mild biliary ductal dilatation is seen secondary to changes of chronic pancreatitis in the head of the pancreas. These changes are similar to that seen on prior abdominal CT. The liver demonstrates some fatty infiltration. No definitive mass is seen. Pancreas: Chronic changes are noted in the pancreas with pancreatic ductal dilatation stable from the prior exam. Multiple calcifications are seen in the head and uncinate process consistent with prior chronic pancreatitis. Previously seen pseudocyst in the head of the pancreas is not as well visualized on today's exam. Spleen: Surgically removed Adrenals/Urinary Tract: Adrenal glands are within normal limits. The kidneys demonstrate a normal enhancement pattern bilaterally. No definitive calculi are seen. Delayed images demonstrate dense nephrograms without significant contrast enhancement. This is of uncertain significance but may be related to incomplete delayed images. No elevated creatinine is seen. The bladder is partially distended. Stomach/Bowel: No obstructive or inflammatory changes of the colon are seen. The appendix is not well  visualized. No obstructive changes of the small bowel are noted. Stomach is within normal limits. Vascular/Lymphatic: Aortic atherosclerosis. No enlarged abdominal or pelvic lymph nodes. Reproductive: Prostate is unremarkable. Other: No abdominal wall hernia or abnormality. No  abdominopelvic ascites. Musculoskeletal: Degenerative changes of lumbar spine are noted. No metastatic lesions are seen. Review of the MIP images confirms the above findings. IMPRESSION: Large left upper lobe mass lesion with apparent chest wall invasion in rib destruction as described consistent with a primary pulmonary neoplasm till proven otherwise. Associated mediastinal and hilar adenopathy is seen. Stable nodule in the right upper lobe unchanged from 2019. No evidence of pulmonary emboli. Hypodense lesion within the left lobe of the thyroid. No followup recommended (ref: J Am Coll Radiol. 2015 Feb;12(2): 143-50). Chronic changes in the pancreas consistent with chronic pancreatitis. Pancreatic ductal dilatation in biliary ductal dilatation is seen. Chronic changes in the abdomen and pelvis stable from previous exams. Electronically Signed   By: Inez Catalina M.D.   On: 07/30/2019 20:17   CT ABDOMEN PELVIS W CONTRAST  Result Date: 07/30/2019 CLINICAL DATA:  Shortness of breath and near syncopal episode, mass lesion on recent chest x-ray EXAM: CT ANGIOGRAPHY CHEST CT ABDOMEN AND PELVIS WITH CONTRAST TECHNIQUE: Multidetector CT imaging of the chest was performed using the standard protocol during bolus administration of intravenous contrast. Multiplanar CT image reconstructions and MIPs were obtained to evaluate the vascular anatomy. Multidetector CT imaging of the abdomen and pelvis was performed using the standard protocol during bolus administration of intravenous contrast. CONTRAST:  140mL OMNIPAQUE IOHEXOL 350 MG/ML SOLN COMPARISON:  Prior chest x-ray from earlier in the same day CT from 03/30/2018. FINDINGS: CTA CHEST FINDINGS Cardiovascular: Thoracic aorta demonstrates atherosclerotic calcifications without aneurysmal dilatation. No cardiac enlargement is noted. Mild coronary calcifications are noted. The pulmonary artery is well visualized within normal enhancement pattern. No filling defect to suggest  pulmonary embolism is seen. Mediastinum/Nodes: Thoracic inlet shows a hypodensity within the left lobe of the thyroid. This measures approximately 14 mm in greatest dimension. The esophagus as visualized is within normal limits. Scattered small mediastinal lymph nodes are noted primarily within the AP window. Small hilar adenopathy is noted bilaterally. Lungs/Pleura: In the left upper lobe there is a focal soft tissue mass lesion which measures approximately 11.8 x 8.2 cm in greatest AP and transverse dimensions respectively. It has some extension to the left hilum and there are associated changes involving the posterolateral aspect of the left third rib consistent with localized invasion and metastatic disease. There also appears to be soft tissue density between the third and fourth ribs laterally consistent with chest wall invasion. Emphysematous changes are noted with evidence of subpleural fibrosis similar to that seen on prior chest x-rays. 7 mm nodule is noted in the right upper lobe best seen on image number 76 of series 4. This is stable from a prior CT examination. Musculoskeletal: As described above there are destructive changes of the posterolateral aspect of the left third rib consistent with localized neoplastic invasion. No other specific rib abnormality is noted. The thoracic spine demonstrates degenerative change without lytic lesion or compression deformity. Review of the MIP images confirms the above findings. CT ABDOMEN and PELVIS FINDINGS Hepatobiliary: Gallbladder is well distended. Mild biliary ductal dilatation is seen secondary to changes of chronic pancreatitis in the head of the pancreas. These changes are similar to that seen on prior abdominal CT. The liver demonstrates some fatty infiltration. No definitive mass is seen. Pancreas: Chronic changes are noted  in the pancreas with pancreatic ductal dilatation stable from the prior exam. Multiple calcifications are seen in the head and  uncinate process consistent with prior chronic pancreatitis. Previously seen pseudocyst in the head of the pancreas is not as well visualized on today's exam. Spleen: Surgically removed Adrenals/Urinary Tract: Adrenal glands are within normal limits. The kidneys demonstrate a normal enhancement pattern bilaterally. No definitive calculi are seen. Delayed images demonstrate dense nephrograms without significant contrast enhancement. This is of uncertain significance but may be related to incomplete delayed images. No elevated creatinine is seen. The bladder is partially distended. Stomach/Bowel: No obstructive or inflammatory changes of the colon are seen. The appendix is not well visualized. No obstructive changes of the small bowel are noted. Stomach is within normal limits. Vascular/Lymphatic: Aortic atherosclerosis. No enlarged abdominal or pelvic lymph nodes. Reproductive: Prostate is unremarkable. Other: No abdominal wall hernia or abnormality. No abdominopelvic ascites. Musculoskeletal: Degenerative changes of lumbar spine are noted. No metastatic lesions are seen. Review of the MIP images confirms the above findings. IMPRESSION: Large left upper lobe mass lesion with apparent chest wall invasion in rib destruction as described consistent with a primary pulmonary neoplasm till proven otherwise. Associated mediastinal and hilar adenopathy is seen. Stable nodule in the right upper lobe unchanged from 2019. No evidence of pulmonary emboli. Hypodense lesion within the left lobe of the thyroid. No followup recommended (ref: J Am Coll Radiol. 2015 Feb;12(2): 143-50). Chronic changes in the pancreas consistent with chronic pancreatitis. Pancreatic ductal dilatation in biliary ductal dilatation is seen. Chronic changes in the abdomen and pelvis stable from previous exams. Electronically Signed   By: Inez Catalina M.D.   On: 07/30/2019 20:17   DG Chest Portable 1 View  Result Date: 07/30/2019 CLINICAL DATA:   Hypotension. EXAM: PORTABLE CHEST 1 VIEW COMPARISON:  December 29, 2018 FINDINGS: Mild, chronic appearing increased interstitial lung markings are noted. A 10.7 cm x 8.7 cm masslike opacity is seen within the left upper lobe. This is increased in severity when compared to the prior study. There is apparent cortical destruction of the adjacent portion of the third left rib. This represents a new finding compared to the prior exam. There is no evidence of a pleural effusion or pneumothorax. The heart size and mediastinal contours are within normal limits. IMPRESSION: 1. Large opacity within the left upper lobe suspicious for the presence of a large lung mass. The presence of adjacent cortical destruction of the third left rib is suggestive of malignant properties. Correlation with chest CT is recommended. Electronically Signed   By: Virgina Norfolk M.D.   On: 07/30/2019 17:57    Pending Labs Unresulted Labs (From admission, onward)    Start     Ordered   08/01/19 0500  Comprehensive metabolic panel  Daily,   R     07/31/19 0750   08/01/19 0500  CBC  Daily,   R     07/31/19 1302   07/31/19 1427  Protime-INR  Once,   STAT     07/31/19 1426   07/31/19 1427  APTT  Once,   STAT     07/31/19 1426   07/31/19 0739  Type and screen [place]  Every 72 hours,   R    Comments: [place]    07/31/19 4098   Unscheduled  Occult blood card to lab, stool RN will collect  As needed,   R    Question:  Specimen to be collected by:  Answer:  RN will collect   07/30/19 2258          Vitals/Pain Today's Vitals   07/31/19 0930 07/31/19 1030 07/31/19 1103 07/31/19 1115  BP:   112/69 109/67  Pulse: 96     Resp: (!) 30 (!) 21 17 (!) 22  Temp:      TempSrc:      SpO2: 92%     Weight:      Height:      PainSc:        Isolation Precautions No active isolations  Medications Medications  acetaminophen (TYLENOL) tablet 650 mg (650 mg Oral Given 07/31/19 1318)   HYDROcodone-acetaminophen (NORCO/VICODIN) 5-325 MG per tablet 1-2 tablet (2 tablets Oral Given 07/31/19 1435)  atorvastatin (LIPITOR) tablet 20 mg (has no administration in time range)  nitroGLYCERIN (NITROSTAT) SL tablet 0.4 mg (has no administration in time range)  amitriptyline (ELAVIL) tablet 10 mg (has no administration in time range)  prochlorperazine (COMPAZINE) tablet 10 mg (has no administration in time range)  famotidine (PEPCID) tablet 20 mg (20 mg Oral Given 07/31/19 0950)  lipase/protease/amylase (CREON) capsule 72,000 Units (72,000 Units Oral Given 07/31/19 1317)  magnesium oxide (MAG-OX) tablet 400 mg (400 mg Oral Given 07/31/19 0950)  ondansetron (ZOFRAN-ODT) disintegrating tablet 4 mg (has no administration in time range)  pantoprazole (PROTONIX) EC tablet 40 mg (40 mg Oral Given 07/31/19 0950)  polyethylene glycol (MIRALAX / GLYCOLAX) packet 17 g (17 g Oral Refused 07/31/19 0951)  gabapentin (NEURONTIN) capsule 100 mg (100 mg Oral Given 07/31/19 0950)  feeding supplement (ENSURE ENLIVE) (ENSURE ENLIVE) liquid 237 mL (237 mLs Oral Given 07/31/19 0951)  multivitamin with minerals tablet 1 tablet (1 tablet Oral Given 07/31/19 0950)  mometasone-formoterol (DULERA) 200-5 MCG/ACT inhaler 2 puff (2 puffs Inhalation Given 07/31/19 0950)  0.9 %  sodium chloride infusion ( Intravenous Rate/Dose Change 07/31/19 1438)  nicotine (NICODERM CQ - dosed in mg/24 hours) patch 14 mg (has no administration in time range)  albuterol (PROVENTIL) (2.5 MG/3ML) 0.083% nebulizer solution 3 mL (has no administration in time range)  sodium chloride 0.9 % bolus 1,000 mL (0 mLs Intravenous Stopped 07/30/19 2031)  iohexol (OMNIPAQUE) 350 MG/ML injection 100 mL (100 mLs Intravenous Contrast Given 07/30/19 1956)  oxyCODONE-acetaminophen (PERCOCET/ROXICET) 5-325 MG per tablet 1 tablet (1 tablet Oral Given 07/30/19 2207)  sodium chloride 0.9 % bolus 1,000 mL (0 mLs Intravenous Stopped 07/31/19 0456)  sodium chloride 0.9 % bolus 1,000 mL  (0 mLs Intravenous Stopped 07/31/19 0654)  ferumoxytol (FERAHEME) 510 mg in sodium chloride 0.9 % 100 mL IVPB (0 mg Intravenous Stopped 07/31/19 1438)    Mobility walks Low fall risk   Focused Assessments Pulmonary Assessment Handoff:  Lung sounds:            R Recommendations: See Admitting Provider Note  Report given to:   Additional Notes:     Patient for a Video assisted bronchoscopy tomorrow morning

## 2019-07-31 NOTE — ED Notes (Signed)
Called 5C to give report.  Receiving RN unable to take report at this time.  States that she will call back in a few minutes.

## 2019-07-31 NOTE — Progress Notes (Signed)
Initial Nutrition Assessment  RD working remotely.  DOCUMENTATION CODES:   Not applicable  INTERVENTION:   -Continue Ensure Enlive po TID, each supplement provides 350 kcal and 20 grams of protein -MVI with minerals daily  NUTRITION DIAGNOSIS:   Increased nutrient needs related to chronic illness(COPD and chronic pancreatitis) as evidenced by estimated needs.  GOAL:   Patient will meet greater than or equal to 90% of their needs  MONITOR:   PO intake, Supplement acceptance, Labs, Weight trends, Skin, I & O's  REASON FOR ASSESSMENT:   Consult Assessment of nutrition requirement/status  ASSESSMENT:   69 year old with history of tobacco abuse, chronic pancreatitis, atrial fibrillation, COPD, and chronic pain on opioid therapy presenting with symptomatic hypotension and a new left upper lobe mass. Patient reports he feels tired today. Breathing is 'tight'. Endorses several months of night sweats and fatigure. Reports some shoulder and intermittent chest pains. He reports he is aware of findings on CT and is 'not ready to accept anything just yet'.  Pt admitted with lt upper lobe mass.   Reviewed I/O's: +4 L x 24 hours  Per CVTS notes, CT revealed lt upper lobe mass with chest wall invasion and mediastinal and hilar adenopathy, concerning for advanced stage lung cancer. Plan for bronchoscopy and endobrachial [place] tomorrow, 07/31/18.   Spoke with pt over the phone, who was very anxious at time of call. He reports he just got settled in his hospital room and had been down in the ED over the past 1-2 days. He reports he is very concerned about upcoming bronchoscopy tomorrow; "I have cancer and that's all I can really think about right now". RD offered supportive listening, actively listened to pt concerns, and provided emotional support.   Pt shares that PTA he had no problems with eating. He reports "I ate and drank all day long- anything and everything". He denies any weight loss. Pt  politely asked to terminate conversation early due to anxiety about diagnosis and upcoming procedure ("I can't think straight or even think about eating right now"). RD expressed appreciation for pt's time and offered to follow-up at a later time during hospital stay. Pt very appreciative of RD call and concern.   Noted Ensure supplements have been ordered. Pt consumed during last hospitalization and will continue order.   Noted pt has history of severe malnutrition in the context of chronic illness. RD suspects malnutrition is ongoing, however, unable to identify at this time.   Wt has been stable over the past 4 months.   Medications reviewed and include creon, magnesium oxide, and 0.9% sodium chloride infusion @ 75 ml/hr.   Labs reviewed: CBS: 164.   Diet Order:   Diet Order            Diet NPO time specified  Diet effective midnight        Diet regular Room service appropriate? Yes; Fluid consistency: Thin  Diet effective now              EDUCATION NEEDS:   Education needs have been addressed  Skin:  Skin Assessment: Reviewed RN Assessment  Last BM:  Unknown  Height:   Ht Readings from Last 1 Encounters:  07/30/19 6\' 3"  (1.905 m)    Weight:   Wt Readings from Last 1 Encounters:  07/30/19 74.4 kg    Ideal Body Weight:  89.1 kg  BMI:  Body mass index is 20.5 kg/m.  Estimated Nutritional Needs:   Kcal:  5038-8828  Protein:  120-135 grams  Fluid:  > 2.2 L    Bonna Steury A. Jimmye Norman, RD, LDN, [place] Registered Dietitian II Certified Diabetes Care and Education Specialist Pager: [phone] After hours Pager: [phone]

## 2019-07-31 NOTE — Progress Notes (Signed)
ANTICOAGULATION CONSULT NOTE - Initial Consult  Pharmacy Consult for Heparin (Xarelto on hold) Indication: atrial fibrillation  Allergies  Allergen Reactions  . Prozac [Fluoxetine Hcl] Nausea Only    Made the patient very sick to his stomach  . Morphine And Related Swelling    Pt states he can tolerate oxycodone and hydromorphone  . Penicillins Rash    Has patient had a PCN reaction causing immediate rash, facial/tongue/throat swelling, SOB or lightheadedness with hypotension: Yes Has patient had a PCN reaction causing severe rash involving mucus membranes or skin necrosis: No Has patient had a PCN reaction that required hospitalization: No Has patient had a PCN reaction occurring within the last 10 years: No If all of the above answers are "NO", then may proceed with Cephalosporin use.    Patient Measurements: Height: 6\' 3"  (190.5 cm) Weight: 164 lb (74.4 kg) IBW/kg (Calculated) : 84.5  Vital Signs: Temp: 97.3 F (36.3 C) (01/05 1710) Temp Source: Oral (01/05 1710) BP: 100/58 (01/05 2330) Pulse Rate: 88 (01/05 2330)  Labs: Recent Labs    07/30/19 1719 07/30/19 1924  HGB 8.4*  --   HCT 26.8*  --   PLT 418*  --   CREATININE 1.20  --   TROPONINIHS 12 12    Estimated Creatinine Clearance: 62.9 mL/min (by C-G formula based on SCr of 1.2 mg/dL).   Medical History: Past Medical History:  Diagnosis Date  . Acute on chronic pancreatitis ([place]) 05/17/2016  . Alcohol abuse   . Alcoholic pancreatitis   . Anginal pain ([place])   . Anxiety and depression 01/20/2015   PHQ 9 = 11 (01/20/15)    . Arthritis    "eat up w/it" (05/17/2016)  . Atrial fibrillation status post cardioversion ([place]) 08/2014  . Atrial flutter, unspecified   . CHF (congestive heart failure) ([place])   . Chronic anticoagulation   . Chronic atrial fibrillation ([place])   . Chronic bronchitis ([place])   . Chronic left-sided low back pain with sciatica 12/21/2012  . Chronic pancreatitis ([place])   . COLD (chronic  obstructive lung disease) ([place])   . Complication of anesthesia    pt C/O confusion after procedure in April 2020  . Daily headache    "need eye examined" (05/17/2016)  . Gastric outlet obstruction 10/2018  . GERD (gastroesophageal reflux disease)   . Hematuria, microscopic 02/13/2015   Noted on UA - Recheck UA at next visit ~ 1 months   . Hyperlipidemia   . Hypertension   . Insomnia 02/11/2016  . Lateral epicondylitis of right elbow 03/17/2017  . Left foot pain 10/12/2013  . Loss of weight 02/19/2018  . Low back pain 12/21/2012  . Malnutrition of moderate degree 05/18/2016  . Neck pain, bilateral posterior 10/15/2014  . Neuropathy 03/16/2015  . Pain of joint of left ankle and foot 06/13/2018  . Pain of right upper extremity 05/30/2016  . Pancreatic pseudocyst    seen on CT scan 07/2014  . Pancreatitis 08/04/2014  . Permanent atrial fibrillation ([place]) 05/05/2017  . Pneumonia    "?a few times" (05/17/2016)  . 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 Tobacco abuse 05/30/2011   Heavy smoker up to 3 ppd down to 15 cigs per day in 09/2014   . Type II diabetes mellitus ([place])   . Wears dentures   . Wears glasses     Assessment: 69 y/o M on Xarelto PTA for afib, now with new onset lung mass, holding Xarelto and starting heparin in case procedure/biopsy is needed.  New onset anemia. No obvious bleeding. Renal function ok. Will likely need to use aPTT to dose given Xarelto influence on heparin levels.   Goal of Therapy:  APTT 66-102 secs Heparin level 0.3-0.7 units/ml Monitor platelets by anticoagulation protocol: Yes   Plan:  Start heparin drip at 1200 units/hr 0930 aPTT/HL Daily CBC/HL/aPTT Monitor for bleeding  Narda Bonds, PharmD, BCPS Clinical Pharmacist Phone: [phone]

## 2019-07-31 NOTE — Consult Note (Signed)
Reason for Consult:Left lung mass Referring Physician: Family medicine  Timothy Martinez is an 69 y.o. male.  HPI: 69 yo man with a history of tobacco abuse, COPD, atrial fibrillation, atrial flutter, ethanol abuse, chronic pancreatitis, hypertension, hyperlipidemia, and chronic back pain.  He presents with a chief complaint of dizziness which started a few days ago and worsened yesterday.  He would feel dizzy when he stood up and get shaky when he walked to the bathroom.  He checked his blood pressure and it was 84/59.  He got the fire department to check his blood pressure and it was low again and EMS was called.  He has chronic dyspnea which worsened about a week ago.  He has a chronic cough, no sputum production.  He complains of left shoulder pain and recently had an injection for that.  He is on Xarelto for his chronic atrial fibrillation.  In the emergency room he was found to be anemic, tachypneic, and hypotensive.  His white blood cell count was elevated.  His hemoglobin was 8.4.  Chest x-ray showed a left upper lobe opacity with destruction of the third rib.  A CT of the chest confirmed a large left upper lobe mass with chest wall invasion along with mediastinal and hilar adenopathy.  There was no evidence of pulmonary embolus.  BNP was elevated at 386, high-sensitivity troponin was 12.  Past Medical History:  Diagnosis Date  . Acute on chronic pancreatitis ([place]) 05/17/2016  . Alcohol abuse   . Alcoholic pancreatitis   . Anginal pain ([place])   . Anxiety and depression 01/20/2015   PHQ 9 = 11 (01/20/15)    . Arthritis    "eat up w/it" (05/17/2016)  . Atrial fibrillation status post cardioversion ([place]) 08/2014  . Atrial flutter, unspecified   . CHF (congestive heart failure) ([place])   . Chronic anticoagulation   . Chronic atrial fibrillation ([place])   . Chronic bronchitis ([place])   . Chronic left-sided low back pain with sciatica 12/21/2012  . Chronic pancreatitis ([place])   . COLD (chronic  obstructive lung disease) ([place])   . Complication of anesthesia    pt C/O confusion after procedure in April 2020  . Daily headache    "need eye examined" (05/17/2016)  . Gastric outlet obstruction 10/2018  . GERD (gastroesophageal reflux disease)   . Hematuria, microscopic 02/13/2015   Noted on UA - Recheck UA at next visit ~ 1 months   . Hyperlipidemia   . Hypertension   . Insomnia 02/11/2016  . Lateral epicondylitis of right elbow 03/17/2017  . Left foot pain 10/12/2013  . Loss of weight 02/19/2018  . Low back pain 12/21/2012  . Malnutrition of moderate degree 05/18/2016  . Neck pain, bilateral posterior 10/15/2014  . Neuropathy 03/16/2015  . Pain of joint of left ankle and foot 06/13/2018  . Pain of right upper extremity 05/30/2016  . Pancreatic pseudocyst    seen on CT scan 07/2014  . Pancreatitis 08/04/2014  . Permanent atrial fibrillation ([place]) 05/05/2017  . Pneumonia    "?a few times" (05/17/2016)  . 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 Tobacco abuse 05/30/2011   Heavy smoker up to 3 ppd down to 15 cigs per day in 09/2014   . Type II diabetes mellitus ([place])   . Wears dentures   . Wears glasses     Past Surgical History:  Procedure Laterality Date  . BACK SURGERY    . BIOPSY  11/20/2018   Procedure: BIOPSY;  Surgeon:  Mansouraty, Telford Nab., MD;  Location: [facility];  Service: Gastroenterology;;  . BIOPSY  02/04/2019   Procedure: BIOPSY;  Surgeon: Irving Copas., MD;  Location: [facility];  Service: Gastroenterology;;  . CARDIOVERSION N/A 09/22/2014   Procedure: CARDIOVERSION;  Surgeon: Thayer Headings, MD;  Location: [facility];  Service: Cardiovascular;  Laterality: N/A;  . COLONOSCOPY W/ POLYPECTOMY  2019  . ERCP N/A 02/04/2019   Procedure: ENDOSCOPIC RETROGRADE CHOLANGIOPANCREATOGRAPHY (ERCP);  Surgeon: Irving Copas., MD;  Location: [facility];  Service: Gastroenterology;  Laterality: N/A;  . ESOPHAGOGASTRODUODENOSCOPY (EGD) WITH PROPOFOL N/A 11/20/2018    Procedure: ESOPHAGOGASTRODUODENOSCOPY (EGD) WITH PROPOFOL;  Surgeon: Rush Landmark Telford Nab., MD;  Location: [facility];  Service: Gastroenterology;  Laterality: N/A;  . ESOPHAGOGASTRODUODENOSCOPY (EGD) WITH PROPOFOL N/A 02/04/2019   Procedure: ESOPHAGOGASTRODUODENOSCOPY (EGD) WITH PROPOFOL;  Surgeon: Rush Landmark Telford Nab., MD;  Location: [facility];  Service: Gastroenterology;  Laterality: N/A;  . FOREIGN BODY REMOVAL  11/20/2018   Procedure: FOREIGN BODY REMOVAL;  Surgeon: Rush Landmark Telford Nab., MD;  Location: [facility];  Service: Gastroenterology;;  . [place]   not cervical, states had lesion on neck which was removed  . POSTERIOR LAMINECTOMY / DECOMPRESSION LUMBAR SPINE  10/2006   of L4, L5 and S1 with a 5-1 diskectomy, microdissection with the microscope/notes 10/24/2006  . SPLENECTOMY  2003  . UPPER ESOPHAGEAL ENDOSCOPIC ULTRASOUND (EUS) N/A 11/20/2018   Procedure: UPPER ESOPHAGEAL ENDOSCOPIC ULTRASOUND (EUS);  Surgeon: Irving Copas., MD;  Location: [facility];  Service: Gastroenterology;  Laterality: N/A;  . UPPER ESOPHAGEAL ENDOSCOPIC ULTRASOUND (EUS) N/A 02/04/2019   Procedure: UPPER ESOPHAGEAL ENDOSCOPIC ULTRASOUND (EUS);  Surgeon: Irving Copas., MD;  Location: [facility];  Service: Gastroenterology;  Laterality: N/A;    Family History  Problem Relation Age of Onset  . Diabetes Mother   . Aneurysm Mother   . Stroke Mother   . Hypertension Mother   . Alcohol abuse Father   . Cancer Father        Lung  . Cancer Sister        Lung Cancer  . Post-traumatic stress disorder Brother   . Heart attack Neg Hx   . Colon cancer Neg Hx   . Liver cancer Neg Hx   . Rectal cancer Neg Hx   . Esophageal cancer Neg Hx   . Stomach cancer Neg Hx   . Inflammatory bowel disease Neg Hx     Social History:  reports that he has been smoking cigarettes. He started smoking about 55 years ago. He has a 26.00 pack-year smoking history. He has never used  smokeless tobacco. He reports previous alcohol use. He reports that he does not use drugs.  Allergies:  Allergies  Allergen Reactions  . Prozac [Fluoxetine Hcl] Nausea Only    Made the patient very sick to his stomach  . Morphine And Related Swelling    Pt states he can tolerate oxycodone and hydromorphone  . Penicillins Rash    Has patient had a PCN reaction causing immediate rash, facial/tongue/throat swelling, SOB or lightheadedness with hypotension: Yes Has patient had a PCN reaction causing severe rash involving mucus membranes or skin necrosis: No Has patient had a PCN reaction that required hospitalization: No Has patient had a PCN reaction occurring within the last 10 years: No If all of the above answers are "NO", then may proceed with Cephalosporin use.     Medications: Prior to Admission: (Not in a hospital admission)   Results for orders  placed or performed during the hospital encounter of 07/30/19 (from the past 48 hour(s))  Brain natriuretic peptide     Status: Abnormal   Collection Time: 07/30/19  5:14 PM  Result Value Ref Range   B Natriuretic Peptide 386.3 (H) 0.0 - 100.0 pg/mL    Comment: Performed at [facility] [address] [place], [place] [address]  CBC with Differential     Status: Abnormal   Collection Time: 07/30/19  5:19 PM  Result Value Ref Range   WBC 22.2 (H) 4.0 - 10.5 K/uL   RBC 3.03 (L) 4.22 - 5.81 MIL/uL   Hemoglobin 8.4 (L) 13.0 - 17.0 g/dL   HCT 26.8 (L) 39.0 - 52.0 %   MCV 88.4 80.0 - 100.0 fL   MCH 27.7 26.0 - 34.0 pg   MCHC 31.3 30.0 - 36.0 g/dL   RDW 14.9 11.5 - 15.5 %   Platelets 418 (H) 150 - 400 K/uL   nRBC 0.1 0.0 - 0.2 %   Neutrophils Relative % 85 %   Neutro Abs 18.8 (H) 1.7 - 7.7 K/uL   Lymphocytes Relative 5 %   Lymphs Abs 1.1 0.7 - 4.0 K/uL   Monocytes Relative 9 %   Monocytes Absolute 2.0 (H) 0.1 - 1.0 K/uL   Eosinophils Relative 0 %   Eosinophils Absolute 0.1 0.0 - 0.5 K/uL   Basophils Relative 0 %    Basophils Absolute 0.1 0.0 - 0.1 K/uL   Immature Granulocytes 1 %   Abs Immature Granulocytes 0.10 (H) 0.00 - 0.07 K/uL    Comment: Performed at [facility] [address] [place], [place] [address]  Basic metabolic panel     Status: Abnormal   Collection Time: 07/30/19  5:19 PM  Result Value Ref Range   Sodium 136 135 - 145 mmol/L   Potassium 4.1 3.5 - 5.1 mmol/L   Chloride 103 98 - 111 mmol/L   CO2 21 (L) 22 - 32 mmol/L   Glucose, Bld 164 (H) 70 - 99 mg/dL   BUN 25 (H) 8 - 23 mg/dL   Creatinine, Ser 1.20 0.61 - 1.24 mg/dL   Calcium 8.1 (L) 8.9 - 10.3 mg/dL   GFR calc non Af Amer >60 >60 mL/min   GFR calc Af Amer >60 >60 mL/min   Anion gap 12 5 - 15    Comment: Performed at [facility], [place] [address] [place], [place] [address]  Troponin I (High Sensitivity)     Status: None   Collection Time: 07/30/19  5:19 PM  Result Value Ref Range   Troponin I (High Sensitivity) 12 <18 ng/L    Comment: (NOTE) Elevated high sensitivity troponin I (hsTnI) values and significant  changes across serial measurements may suggest ACS but many other  chronic and acute conditions are known to elevate hsTnI results.  Refer to the "Links" section for chest pain algorithms and additional  guidance. Performed at [facility], [place] [address], [place], [place] [address]   Troponin I (High Sensitivity)     Status: None   Collection Time: 07/30/19  7:24 PM  Result Value Ref Range   Troponin I (High Sensitivity) 12 <18 ng/L    Comment: (NOTE) Elevated high sensitivity troponin I (hsTnI) values and significant  changes across serial measurements may suggest ACS but many other  chronic and acute conditions are known to elevate hsTnI results.  Refer to the "Links" section for chest pain algorithms and  additional  guidance. Performed at [facility], [place] [address], [place], [place] [address]   SARS CORONAVIRUS 2 (TAT 6-24 HRS) Nasopharyngeal Nasopharyngeal Swab     Status:  None   Collection Time: 07/30/19  8:34 PM   Specimen: Nasopharyngeal Swab  Result Value Ref Range   SARS Coronavirus 2 NEGATIVE NEGATIVE    Comment: (NOTE) SARS-CoV-2 target nucleic acids are NOT DETECTED. The SARS-CoV-2 RNA is generally detectable in upper and lower respiratory specimens during the acute phase of infection. Negative results do not preclude SARS-CoV-2 infection, do not rule out co-infections with other pathogens, and should not be used as the sole basis for treatment or other patient management decisions. Negative results must be combined with clinical observations, patient history, and epidemiological information. The expected result is Negative. Fact Sheet for Patients: SugarRoll.be Fact Sheet for Healthcare Providers: https://www.woods-mathews.com/ This test is not yet approved or cleared by the [place] FDA and  has been authorized for detection and/or diagnosis of SARS-CoV-2 by FDA under an Emergency Use Authorization (EUA). This EUA will remain  in effect (meaning this test can be used) for the duration of the COVID-19 declaration under Section 56 4(b)(1) of the Act, 21 U.S.C. section 360bbb-3(b)(1), unless the authorization is terminated or revoked sooner. Performed at [facility], [place] [address], [place], [place] [address]   Vitamin B12     Status: Abnormal   Collection Time: 07/31/19 12:02 AM  Result Value Ref Range   Vitamin B-12 1,124 (H) 180 - 914 pg/mL    Comment: Performed at [facility], [place] [address] [place], [place] [address]  Folate     Status: None   Collection Time: 07/31/19 12:02 AM  Result Value Ref Range   Folate 6.0 >5.9 ng/mL    Comment: Performed at [facility], [place] [address], [place], [place] [address]  Iron and TIBC     Status: Abnormal   Collection Time: 07/31/19 12:02 AM  Result Value Ref Range   Iron <5 (L) 45 - 182 ug/dL    Comment: REPEATED TO VERIFY    TIBC 209 (L) 250 - 450 ug/dL   Saturation Ratios NOT CALCULATED 17.9 - 39.5 %   UIBC NOT CALCULATED ug/dL    Comment: Performed at [facility] [address], [place], [place] [address]  Ferritin     Status: None   Collection Time: 07/31/19 12:02 AM  Result Value Ref Range   Ferritin 160 24 - 336 ng/mL    Comment: Performed at [facility], [place] [address], [place], [place] [address]  Reticulocytes     Status: Abnormal   Collection Time: 07/31/19 12:02 AM  Result Value Ref Range   Retic Ct Pct 1.1 0.4 - 3.1 %   RBC. 2.64 (L) 4.22 - 5.81 MIL/uL   Retic Count, Absolute 29.3 19.0 - 186.0 K/uL   Immature Retic Fract 5.1 2.3 - 15.9 %    Comment: Performed at [facility] [address] [place],  [address]  Urinalysis, Routine w reflex microscopic     Status: Abnormal   Collection Time: 07/31/19  2:35 AM  Result Value Ref Range   Color, Urine AMBER (A) YELLOW    Comment: BIOCHEMICALS MAY BE AFFECTED BY COLOR   APPearance HAZY (A) CLEAR   Specific Gravity, Urine >1.046 (H) 1.005 - 1.030   pH 5.0 5.0 - 8.0   Glucose, UA NEGATIVE NEGATIVE mg/dL  Hgb urine dipstick NEGATIVE NEGATIVE   Bilirubin Urine NEGATIVE NEGATIVE   Ketones, ur NEGATIVE NEGATIVE mg/dL   Protein, ur 30 (A) NEGATIVE mg/dL   Nitrite NEGATIVE NEGATIVE   Leukocytes,Ua NEGATIVE NEGATIVE   RBC / HPF 0-5 0 - 5 RBC/hpf   WBC, UA 0-5 0 - 5 WBC/hpf   Bacteria, UA MANY (A) NONE SEEN   Squamous Epithelial / LPF 0-5 0 - 5   Mucus PRESENT    Hyaline Casts, UA PRESENT    Sperm, UA PRESENT     Comment: Performed at [facility], [place] [address] [place], [place] [address]  Comprehensive metabolic panel     Status: Abnormal   Collection Time: 07/31/19  5:54 AM  Result Value Ref Range   Sodium 137 135 - 145 mmol/L   Potassium 3.6 3.5 - 5.1 mmol/L   Chloride 111 98 - 111 mmol/L   CO2 18 (L) 22 - 32 mmol/L   Glucose, Bld 149 (H) 70 - 99 mg/dL   BUN 29 (H) 8 - 23 mg/dL   Creatinine, Ser 1.12  0.61 - 1.24 mg/dL   Calcium 7.5 (L) 8.9 - 10.3 mg/dL   Total Protein 5.1 (L) 6.5 - 8.1 g/dL   Albumin 1.8 (L) 3.5 - 5.0 g/dL   AST 27 15 - 41 U/L   ALT 15 0 - 44 U/L   Alkaline Phosphatase 180 (H) 38 - 126 U/L   Total Bilirubin 0.5 0.3 - 1.2 mg/dL   GFR calc non Af Amer >60 >60 mL/min   GFR calc Af Amer >60 >60 mL/min   Anion gap 8 5 - 15    Comment: Performed at [facility], [place] [address], [place], [place] [address]  CBC     Status: Abnormal   Collection Time: 07/31/19  5:54 AM  Result Value Ref Range   WBC 21.3 (H) 4.0 - 10.5 K/uL   RBC 2.70 (L) 4.22 - 5.81 MIL/uL   Hemoglobin 7.5 (L) 13.0 - 17.0 g/dL   HCT 24.3 (L) 39.0 - 52.0 %   MCV 90.0 80.0 - 100.0 fL   MCH 27.8 26.0 - 34.0 pg   MCHC 30.9 30.0 - 36.0 g/dL   RDW 15.6 (H) 11.5 - 15.5 %   Platelets 382 150 - 400 K/uL   nRBC 0.2 0.0 - 0.2 %    Comment: Performed at [facility], [place] [address], [place], [place] [address]  Type and screen [place]     Status: None   Collection Time: 07/31/19  8:41 AM  Result Value Ref Range   ABO/RH(D) O POS    Antibody Screen NEG    Sample Expiration      08/03/2019,2359 Performed at [facility], [place] [address], [place],  [address]   CBC     Status: Abnormal   Collection Time: 07/31/19  8:41 AM  Result Value Ref Range   WBC 24.6 (H) 4.0 - 10.5 K/uL   RBC 3.01 (L) 4.22 - 5.81 MIL/uL   Hemoglobin 8.3 (L) 13.0 - 17.0 g/dL   HCT 26.9 (L) 39.0 - 52.0 %   MCV 89.4 80.0 - 100.0 fL   MCH 27.6 26.0 - 34.0 pg   MCHC 30.9 30.0 - 36.0 g/dL   RDW 15.6 (H) 11.5 - 15.5 %   Platelets 396 150 - 400 K/uL   nRBC 0.2 0.0 - 0.2 %    Comment: Performed at [facility], [place] [address], [place], [place] [address]  ABO/Rh     Status: None   Collection Time: 07/31/19  8:41 AM  Result Value Ref Range   ABO/RH(D)      O POS Performed at [facility] [address] [place],  [address]     CT Angio Chest PE W and/or Wo  Contrast  Result Date: 07/30/2019 CLINICAL DATA:  Shortness of breath and near syncopal episode, mass lesion on recent chest x-ray EXAM: CT ANGIOGRAPHY CHEST CT ABDOMEN AND PELVIS WITH CONTRAST TECHNIQUE: Multidetector CT imaging of the chest was performed using the standard protocol during bolus administration of intravenous contrast. Multiplanar CT image reconstructions and MIPs were obtained to evaluate the vascular anatomy. Multidetector CT imaging of the abdomen and pelvis was performed using the standard protocol during bolus administration of intravenous contrast. CONTRAST:  146mL OMNIPAQUE IOHEXOL 350 MG/ML SOLN COMPARISON:  Prior chest x-ray from earlier in the same day CT from 03/30/2018. FINDINGS: CTA CHEST FINDINGS Cardiovascular: Thoracic aorta demonstrates atherosclerotic calcifications without aneurysmal dilatation. No cardiac enlargement is noted. Mild coronary calcifications are noted. The pulmonary artery is well visualized within normal enhancement pattern. No filling defect to suggest pulmonary embolism is seen. Mediastinum/Nodes: Thoracic inlet shows a hypodensity within the left lobe of the thyroid. This measures approximately 14 mm in greatest dimension. The esophagus as visualized is within normal limits. Scattered small mediastinal lymph nodes are noted primarily within the AP window. Small hilar adenopathy is noted bilaterally. Lungs/Pleura: In the left upper lobe there is a focal soft tissue mass lesion which measures approximately 11.8 x 8.2 cm in greatest AP and transverse dimensions respectively. It has some extension to the left hilum and there are associated changes involving the posterolateral aspect of the left third rib consistent with localized invasion and metastatic disease. There also appears to be soft tissue density between the third and fourth ribs laterally consistent with chest wall invasion. Emphysematous changes are noted with evidence of subpleural fibrosis similar to  that seen on prior chest x-rays. 7 mm nodule is noted in the right upper lobe best seen on image number 76 of series 4. This is stable from a prior CT examination. Musculoskeletal: As described above there are destructive changes of the posterolateral aspect of the left third rib consistent with localized neoplastic invasion. No other specific rib abnormality is noted. The thoracic spine demonstrates degenerative change without lytic lesion or compression deformity. Review of the MIP images confirms the above findings. CT ABDOMEN and PELVIS FINDINGS Hepatobiliary: Gallbladder is well distended. Mild biliary ductal dilatation is seen secondary to changes of chronic pancreatitis in the head of the pancreas. These changes are similar to that seen on prior abdominal CT. The liver demonstrates some fatty infiltration. No definitive mass is seen. Pancreas: Chronic changes are noted in the pancreas with pancreatic ductal dilatation stable from the prior exam. Multiple calcifications are seen in the head and uncinate process consistent with prior chronic pancreatitis. Previously seen pseudocyst in the head of the pancreas is not as well visualized on today's exam. Spleen: Surgically removed Adrenals/Urinary Tract: Adrenal glands are within normal limits. The kidneys demonstrate a normal enhancement pattern bilaterally. No definitive calculi are seen. Delayed images demonstrate dense nephrograms without significant contrast enhancement. This is of uncertain significance but may be related to incomplete delayed images. No elevated creatinine is seen. The bladder is partially distended. Stomach/Bowel: No obstructive or inflammatory changes of the colon are seen. The appendix is  not well visualized. No obstructive changes of the small bowel are noted. Stomach is within normal limits. Vascular/Lymphatic: Aortic atherosclerosis. No enlarged abdominal or pelvic lymph nodes. Reproductive: Prostate is unremarkable. Other: No  abdominal wall hernia or abnormality. No abdominopelvic ascites. Musculoskeletal: Degenerative changes of lumbar spine are noted. No metastatic lesions are seen. Review of the MIP images confirms the above findings. IMPRESSION: Large left upper lobe mass lesion with apparent chest wall invasion in rib destruction as described consistent with a primary pulmonary neoplasm till proven otherwise. Associated mediastinal and hilar adenopathy is seen. Stable nodule in the right upper lobe unchanged from 2019. No evidence of pulmonary emboli. Hypodense lesion within the left lobe of the thyroid. No followup recommended (ref: J Am Coll Radiol. 2015 Feb;12(2): 143-50). Chronic changes in the pancreas consistent with chronic pancreatitis. Pancreatic ductal dilatation in biliary ductal dilatation is seen. Chronic changes in the abdomen and pelvis stable from previous exams. Electronically Signed   By: Inez Catalina M.D.   On: 07/30/2019 20:17   CT ABDOMEN PELVIS W CONTRAST  Result Date: 07/30/2019 CLINICAL DATA:  Shortness of breath and near syncopal episode, mass lesion on recent chest x-ray EXAM: CT ANGIOGRAPHY CHEST CT ABDOMEN AND PELVIS WITH CONTRAST TECHNIQUE: Multidetector CT imaging of the chest was performed using the standard protocol during bolus administration of intravenous contrast. Multiplanar CT image reconstructions and MIPs were obtained to evaluate the vascular anatomy. Multidetector CT imaging of the abdomen and pelvis was performed using the standard protocol during bolus administration of intravenous contrast. CONTRAST:  185mL OMNIPAQUE IOHEXOL 350 MG/ML SOLN COMPARISON:  Prior chest x-ray from earlier in the same day CT from 03/30/2018. FINDINGS: CTA CHEST FINDINGS Cardiovascular: Thoracic aorta demonstrates atherosclerotic calcifications without aneurysmal dilatation. No cardiac enlargement is noted. Mild coronary calcifications are noted. The pulmonary artery is well visualized within normal  enhancement pattern. No filling defect to suggest pulmonary embolism is seen. Mediastinum/Nodes: Thoracic inlet shows a hypodensity within the left lobe of the thyroid. This measures approximately 14 mm in greatest dimension. The esophagus as visualized is within normal limits. Scattered small mediastinal lymph nodes are noted primarily within the AP window. Small hilar adenopathy is noted bilaterally. Lungs/Pleura: In the left upper lobe there is a focal soft tissue mass lesion which measures approximately 11.8 x 8.2 cm in greatest AP and transverse dimensions respectively. It has some extension to the left hilum and there are associated changes involving the posterolateral aspect of the left third rib consistent with localized invasion and metastatic disease. There also appears to be soft tissue density between the third and fourth ribs laterally consistent with chest wall invasion. Emphysematous changes are noted with evidence of subpleural fibrosis similar to that seen on prior chest x-rays. 7 mm nodule is noted in the right upper lobe best seen on image number 76 of series 4. This is stable from a prior CT examination. Musculoskeletal: As described above there are destructive changes of the posterolateral aspect of the left third rib consistent with localized neoplastic invasion. No other specific rib abnormality is noted. The thoracic spine demonstrates degenerative change without lytic lesion or compression deformity. Review of the MIP images confirms the above findings. CT ABDOMEN and PELVIS FINDINGS Hepatobiliary: Gallbladder is well distended. Mild biliary ductal dilatation is seen secondary to changes of chronic pancreatitis in the head of the pancreas. These changes are similar to that seen on prior abdominal CT. The liver demonstrates some fatty infiltration. No definitive mass is seen. Pancreas: Chronic changes  are noted in the pancreas with pancreatic ductal dilatation stable from the prior exam.  Multiple calcifications are seen in the head and uncinate process consistent with prior chronic pancreatitis. Previously seen pseudocyst in the head of the pancreas is not as well visualized on today's exam. Spleen: Surgically removed Adrenals/Urinary Tract: Adrenal glands are within normal limits. The kidneys demonstrate a normal enhancement pattern bilaterally. No definitive calculi are seen. Delayed images demonstrate dense nephrograms without significant contrast enhancement. This is of uncertain significance but may be related to incomplete delayed images. No elevated creatinine is seen. The bladder is partially distended. Stomach/Bowel: No obstructive or inflammatory changes of the colon are seen. The appendix is not well visualized. No obstructive changes of the small bowel are noted. Stomach is within normal limits. Vascular/Lymphatic: Aortic atherosclerosis. No enlarged abdominal or pelvic lymph nodes. Reproductive: Prostate is unremarkable. Other: No abdominal wall hernia or abnormality. No abdominopelvic ascites. Musculoskeletal: Degenerative changes of lumbar spine are noted. No metastatic lesions are seen. Review of the MIP images confirms the above findings. IMPRESSION: Large left upper lobe mass lesion with apparent chest wall invasion in rib destruction as described consistent with a primary pulmonary neoplasm till proven otherwise. Associated mediastinal and hilar adenopathy is seen. Stable nodule in the right upper lobe unchanged from 2019. No evidence of pulmonary emboli. Hypodense lesion within the left lobe of the thyroid. No followup recommended (ref: J Am Coll Radiol. 2015 Feb;12(2): 143-50). Chronic changes in the pancreas consistent with chronic pancreatitis. Pancreatic ductal dilatation in biliary ductal dilatation is seen. Chronic changes in the abdomen and pelvis stable from previous exams. Electronically Signed   By: Inez Catalina M.D.   On: 07/30/2019 20:17   DG Chest Portable 1  View  Result Date: 07/30/2019 CLINICAL DATA:  Hypotension. EXAM: PORTABLE CHEST 1 VIEW COMPARISON:  December 29, 2018 FINDINGS: Mild, chronic appearing increased interstitial lung markings are noted. A 10.7 cm x 8.7 cm masslike opacity is seen within the left upper lobe. This is increased in severity when compared to the prior study. There is apparent cortical destruction of the adjacent portion of the third left rib. This represents a new finding compared to the prior exam. There is no evidence of a pleural effusion or pneumothorax. The heart size and mediastinal contours are within normal limits. IMPRESSION: 1. Large opacity within the left upper lobe suspicious for the presence of a large lung mass. The presence of adjacent cortical destruction of the third left rib is suggestive of malignant properties. Correlation with chest CT is recommended. Electronically Signed   By: Virgina Norfolk M.D.   On: 07/30/2019 17:57    Review of Systems  Constitutional: Positive for activity change and unexpected weight change.  HENT: Negative for trouble swallowing and voice change.   Eyes: Negative for visual disturbance.  Respiratory: Positive for cough, shortness of breath and wheezing.   Cardiovascular: Positive for chest pain.  Gastrointestinal: Positive for abdominal pain.  Genitourinary: Negative for difficulty urinating and dysuria.  Musculoskeletal: Positive for arthralgias and back pain.  Neurological: Positive for dizziness. Negative for seizures and weakness.  Hematological: Bruises/bleeds easily.   Blood pressure 109/67, pulse 96, temperature (!) 97.3 F (36.3 C), temperature source Oral, resp. rate (!) 22, height 6\' 3"  (1.905 m), weight 74.4 kg, SpO2 92 %. Physical Exam  Vitals reviewed. Constitutional: He is oriented to person, place, and time.  Ill-appearing  HENT:  Fungating lesion on left ear  Eyes: Pupils are equal, round, and reactive to  light. EOM are normal.  Cardiovascular: Normal  rate, regular rhythm and normal heart sounds.  No murmur heard. Respiratory: Effort normal. No respiratory distress. He has no wheezes.  GI: Soft. He exhibits no distension. There is no abdominal tenderness.  Musculoskeletal:        General: No edema.  Lymphadenopathy:    He has no cervical adenopathy.  Neurological: He is alert and oriented to person, place, and time. No cranial nerve deficit. He exhibits normal muscle tone.  Skin: Skin is warm and dry.    Assessment/Plan: Timothy Martinez is a 69 year old man with a history of tobacco and ethanol abuse.  He has a history of COPD, chronic pancreatitis, chronic atrial fibrillation for which he is on anticoagulation, hypertension, hyperlipidemia, chronic back pain, and arthritis.  He presents with a several week history of left shoulder pain and new onset dizziness.  He was found to be anemic with an elevated white blood cell count.  He also was found to have an 11.8 x 8.2 cm left upper lobe mass with chest wall invasion along with mediastinal and hilar adenopathy.  Findings are consistent with advanced stage lung cancer, T4, N3, stage IIIC by CT scan.  He needs a tissue diagnosis in order to guide therapy.  I recommended that we proceed with bronchoscopy and endobronchial ultrasound for diagnostic and staging purposes.  I informed him of the general nature of the procedure.  He has had endoscopic procedures previously.  We would plan to do this under general anesthesia.  I informed him of the indications, risks, benefits, and alternatives.  He understands there is no guarantee of a definitive diagnosis.  He understands the risks include, but not limited to death, MI, DVT, PE, bleeding, loss of airway, pneumothorax, failure to make a diagnosis, as well as possibility of other unforeseeable complications.  He accepts the risks and agrees to proceed.  He is on Xarelto for his chronic atrial fibrillation.  He needs to be off that for 24 hours prior to  the procedure.  We will plan bronchoscopy and endobronchial ultrasound on Thursday, 08/01/2019.  [name] 07/31/2019, 11:45 AM

## 2019-07-31 NOTE — H&P (View-Only) (Signed)
Reason for Consult:Left lung mass Referring Physician: Family medicine  Timothy Martinez is an 69 y.o. male.  HPI: 69 yo man with a history of tobacco abuse, COPD, atrial fibrillation, atrial flutter, ethanol abuse, chronic pancreatitis, hypertension, hyperlipidemia, and chronic back pain.  He presents with a chief complaint of dizziness which started a few days ago and worsened yesterday.  He would feel dizzy when he stood up and get shaky when he walked to the bathroom.  He checked his blood pressure and it was 84/59.  He got the fire department to check his blood pressure and it was low again and EMS was called.  He has chronic dyspnea which worsened about a week ago.  He has a chronic cough, no sputum production.  He complains of left shoulder pain and recently had an injection for that.  He is on Xarelto for his chronic atrial fibrillation.  In the emergency room he was found to be anemic, tachypneic, and hypotensive.  His white blood cell count was elevated.  His hemoglobin was 8.4.  Chest x-ray showed a left upper lobe opacity with destruction of the third rib.  A CT of the chest confirmed a large left upper lobe mass with chest wall invasion along with mediastinal and hilar adenopathy.  There was no evidence of pulmonary embolus.  BNP was elevated at 386, high-sensitivity troponin was 12.  Past Medical History:  Diagnosis Date  . Acute on chronic pancreatitis ([place]) 05/17/2016  . Alcohol abuse   . Alcoholic pancreatitis   . Anginal pain ([place])   . Anxiety and depression 01/20/2015   PHQ 9 = 11 (01/20/15)    . Arthritis    "eat up w/it" (05/17/2016)  . Atrial fibrillation status post cardioversion ([place]) 08/2014  . Atrial flutter, unspecified   . CHF (congestive heart failure) ([place])   . Chronic anticoagulation   . Chronic atrial fibrillation ([place])   . Chronic bronchitis ([place])   . Chronic left-sided low back pain with sciatica 12/21/2012  . Chronic pancreatitis ([place])   . COLD (chronic  obstructive lung disease) ([place])   . Complication of anesthesia    pt C/O confusion after procedure in April 2020  . Daily headache    "need eye examined" (05/17/2016)  . Gastric outlet obstruction 10/2018  . GERD (gastroesophageal reflux disease)   . Hematuria, microscopic 02/13/2015   Noted on UA - Recheck UA at next visit ~ 1 months   . Hyperlipidemia   . Hypertension   . Insomnia 02/11/2016  . Lateral epicondylitis of right elbow 03/17/2017  . Left foot pain 10/12/2013  . Loss of weight 02/19/2018  . Low back pain 12/21/2012  . Malnutrition of moderate degree 05/18/2016  . Neck pain, bilateral posterior 10/15/2014  . Neuropathy 03/16/2015  . Pain of joint of left ankle and foot 06/13/2018  . Pain of right upper extremity 05/30/2016  . Pancreatic pseudocyst    seen on CT scan 07/2014  . Pancreatitis 08/04/2014  . Permanent atrial fibrillation ([place]) 05/05/2017  . Pneumonia    "?a few times" (05/17/2016)  . 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 Tobacco abuse 05/30/2011   Heavy smoker up to 3 ppd down to 15 cigs per day in 09/2014   . Type II diabetes mellitus ([place])   . Wears dentures   . Wears glasses     Past Surgical History:  Procedure Laterality Date  . BACK SURGERY    . BIOPSY  11/20/2018   Procedure: BIOPSY;  Surgeon:  Mansouraty, Telford Nab., MD;  Location: [facility];  Service: Gastroenterology;;  . BIOPSY  02/04/2019   Procedure: BIOPSY;  Surgeon: Irving Copas., MD;  Location: [facility];  Service: Gastroenterology;;  . CARDIOVERSION N/A 09/22/2014   Procedure: CARDIOVERSION;  Surgeon: Thayer Headings, MD;  Location: [facility];  Service: Cardiovascular;  Laterality: N/A;  . COLONOSCOPY W/ POLYPECTOMY  2019  . ERCP N/A 02/04/2019   Procedure: ENDOSCOPIC RETROGRADE CHOLANGIOPANCREATOGRAPHY (ERCP);  Surgeon: Irving Copas., MD;  Location: [facility];  Service: Gastroenterology;  Laterality: N/A;  . ESOPHAGOGASTRODUODENOSCOPY (EGD) WITH PROPOFOL N/A 11/20/2018    Procedure: ESOPHAGOGASTRODUODENOSCOPY (EGD) WITH PROPOFOL;  Surgeon: Rush Landmark Telford Nab., MD;  Location: [facility];  Service: Gastroenterology;  Laterality: N/A;  . ESOPHAGOGASTRODUODENOSCOPY (EGD) WITH PROPOFOL N/A 02/04/2019   Procedure: ESOPHAGOGASTRODUODENOSCOPY (EGD) WITH PROPOFOL;  Surgeon: Rush Landmark Telford Nab., MD;  Location: [facility];  Service: Gastroenterology;  Laterality: N/A;  . FOREIGN BODY REMOVAL  11/20/2018   Procedure: FOREIGN BODY REMOVAL;  Surgeon: Rush Landmark Telford Nab., MD;  Location: [facility];  Service: Gastroenterology;;  . [place]   not cervical, states had lesion on neck which was removed  . POSTERIOR LAMINECTOMY / DECOMPRESSION LUMBAR SPINE  10/2006   of L4, L5 and S1 with a 5-1 diskectomy, microdissection with the microscope/notes 10/24/2006  . SPLENECTOMY  2003  . UPPER ESOPHAGEAL ENDOSCOPIC ULTRASOUND (EUS) N/A 11/20/2018   Procedure: UPPER ESOPHAGEAL ENDOSCOPIC ULTRASOUND (EUS);  Surgeon: Irving Copas., MD;  Location: [facility];  Service: Gastroenterology;  Laterality: N/A;  . UPPER ESOPHAGEAL ENDOSCOPIC ULTRASOUND (EUS) N/A 02/04/2019   Procedure: UPPER ESOPHAGEAL ENDOSCOPIC ULTRASOUND (EUS);  Surgeon: Irving Copas., MD;  Location: [facility];  Service: Gastroenterology;  Laterality: N/A;    Family History  Problem Relation Age of Onset  . Diabetes Mother   . Aneurysm Mother   . Stroke Mother   . Hypertension Mother   . Alcohol abuse Father   . Cancer Father        Lung  . Cancer Sister        Lung Cancer  . Post-traumatic stress disorder Brother   . Heart attack Neg Hx   . Colon cancer Neg Hx   . Liver cancer Neg Hx   . Rectal cancer Neg Hx   . Esophageal cancer Neg Hx   . Stomach cancer Neg Hx   . Inflammatory bowel disease Neg Hx     Social History:  reports that he has been smoking cigarettes. He started smoking about 55 years ago. He has a 26.00 pack-year smoking history. He has never used  smokeless tobacco. He reports previous alcohol use. He reports that he does not use drugs.  Allergies:  Allergies  Allergen Reactions  . Prozac [Fluoxetine Hcl] Nausea Only    Made the patient very sick to his stomach  . Morphine And Related Swelling    Pt states he can tolerate oxycodone and hydromorphone  . Penicillins Rash    Has patient had a PCN reaction causing immediate rash, facial/tongue/throat swelling, SOB or lightheadedness with hypotension: Yes Has patient had a PCN reaction causing severe rash involving mucus membranes or skin necrosis: No Has patient had a PCN reaction that required hospitalization: No Has patient had a PCN reaction occurring within the last 10 years: No If all of the above answers are "NO", then may proceed with Cephalosporin use.     Medications: Prior to Admission: (Not in a hospital admission)   Results for orders  placed or performed during the hospital encounter of 07/30/19 (from the past 48 hour(s))  Brain natriuretic peptide     Status: Abnormal   Collection Time: 07/30/19  5:14 PM  Result Value Ref Range   B Natriuretic Peptide 386.3 (H) 0.0 - 100.0 pg/mL    Comment: Performed at [facility] [address] [place], [place] [address]  CBC with Differential     Status: Abnormal   Collection Time: 07/30/19  5:19 PM  Result Value Ref Range   WBC 22.2 (H) 4.0 - 10.5 K/uL   RBC 3.03 (L) 4.22 - 5.81 MIL/uL   Hemoglobin 8.4 (L) 13.0 - 17.0 g/dL   HCT 26.8 (L) 39.0 - 52.0 %   MCV 88.4 80.0 - 100.0 fL   MCH 27.7 26.0 - 34.0 pg   MCHC 31.3 30.0 - 36.0 g/dL   RDW 14.9 11.5 - 15.5 %   Platelets 418 (H) 150 - 400 K/uL   nRBC 0.1 0.0 - 0.2 %   Neutrophils Relative % 85 %   Neutro Abs 18.8 (H) 1.7 - 7.7 K/uL   Lymphocytes Relative 5 %   Lymphs Abs 1.1 0.7 - 4.0 K/uL   Monocytes Relative 9 %   Monocytes Absolute 2.0 (H) 0.1 - 1.0 K/uL   Eosinophils Relative 0 %   Eosinophils Absolute 0.1 0.0 - 0.5 K/uL   Basophils Relative 0 %    Basophils Absolute 0.1 0.0 - 0.1 K/uL   Immature Granulocytes 1 %   Abs Immature Granulocytes 0.10 (H) 0.00 - 0.07 K/uL    Comment: Performed at [facility] [address] [place], [place] [address]  Basic metabolic panel     Status: Abnormal   Collection Time: 07/30/19  5:19 PM  Result Value Ref Range   Sodium 136 135 - 145 mmol/L   Potassium 4.1 3.5 - 5.1 mmol/L   Chloride 103 98 - 111 mmol/L   CO2 21 (L) 22 - 32 mmol/L   Glucose, Bld 164 (H) 70 - 99 mg/dL   BUN 25 (H) 8 - 23 mg/dL   Creatinine, Ser 1.20 0.61 - 1.24 mg/dL   Calcium 8.1 (L) 8.9 - 10.3 mg/dL   GFR calc non Af Amer >60 >60 mL/min   GFR calc Af Amer >60 >60 mL/min   Anion gap 12 5 - 15    Comment: Performed at [facility], [place] [address] [place], [place] [address]  Troponin I (High Sensitivity)     Status: None   Collection Time: 07/30/19  5:19 PM  Result Value Ref Range   Troponin I (High Sensitivity) 12 <18 ng/L    Comment: (NOTE) Elevated high sensitivity troponin I (hsTnI) values and significant  changes across serial measurements may suggest ACS but many other  chronic and acute conditions are known to elevate hsTnI results.  Refer to the "Links" section for chest pain algorithms and additional  guidance. Performed at [facility], [place] [address], [place], [place] [address]   Troponin I (High Sensitivity)     Status: None   Collection Time: 07/30/19  7:24 PM  Result Value Ref Range   Troponin I (High Sensitivity) 12 <18 ng/L    Comment: (NOTE) Elevated high sensitivity troponin I (hsTnI) values and significant  changes across serial measurements may suggest ACS but many other  chronic and acute conditions are known to elevate hsTnI results.  Refer to the "Links" section for chest pain algorithms and  additional  guidance. Performed at [facility], [place] [address], [place], [place] [address]   SARS CORONAVIRUS 2 (TAT 6-24 HRS) Nasopharyngeal Nasopharyngeal Swab     Status:  None   Collection Time: 07/30/19  8:34 PM   Specimen: Nasopharyngeal Swab  Result Value Ref Range   SARS Coronavirus 2 NEGATIVE NEGATIVE    Comment: (NOTE) SARS-CoV-2 target nucleic acids are NOT DETECTED. The SARS-CoV-2 RNA is generally detectable in upper and lower respiratory specimens during the acute phase of infection. Negative results do not preclude SARS-CoV-2 infection, do not rule out co-infections with other pathogens, and should not be used as the sole basis for treatment or other patient management decisions. Negative results must be combined with clinical observations, patient history, and epidemiological information. The expected result is Negative. Fact Sheet for Patients: SugarRoll.be Fact Sheet for Healthcare Providers: https://www.woods-mathews.com/ This test is not yet approved or cleared by the [place] FDA and  has been authorized for detection and/or diagnosis of SARS-CoV-2 by FDA under an Emergency Use Authorization (EUA). This EUA will remain  in effect (meaning this test can be used) for the duration of the COVID-19 declaration under Section 56 4(b)(1) of the Act, 21 U.S.C. section 360bbb-3(b)(1), unless the authorization is terminated or revoked sooner. Performed at [facility], [place] [address], [place], [place] [address]   Vitamin B12     Status: Abnormal   Collection Time: 07/31/19 12:02 AM  Result Value Ref Range   Vitamin B-12 1,124 (H) 180 - 914 pg/mL    Comment: Performed at [facility], [place] [address] [place], [place] [address]  Folate     Status: None   Collection Time: 07/31/19 12:02 AM  Result Value Ref Range   Folate 6.0 >5.9 ng/mL    Comment: Performed at [facility], [place] [address], [place], [place] [address]  Iron and TIBC     Status: Abnormal   Collection Time: 07/31/19 12:02 AM  Result Value Ref Range   Iron <5 (L) 45 - 182 ug/dL    Comment: REPEATED TO VERIFY    TIBC 209 (L) 250 - 450 ug/dL   Saturation Ratios NOT CALCULATED 17.9 - 39.5 %   UIBC NOT CALCULATED ug/dL    Comment: Performed at [facility] [address], [place], [place] [address]  Ferritin     Status: None   Collection Time: 07/31/19 12:02 AM  Result Value Ref Range   Ferritin 160 24 - 336 ng/mL    Comment: Performed at [facility], [place] [address], [place], [place] [address]  Reticulocytes     Status: Abnormal   Collection Time: 07/31/19 12:02 AM  Result Value Ref Range   Retic Ct Pct 1.1 0.4 - 3.1 %   RBC. 2.64 (L) 4.22 - 5.81 MIL/uL   Retic Count, Absolute 29.3 19.0 - 186.0 K/uL   Immature Retic Fract 5.1 2.3 - 15.9 %    Comment: Performed at [facility] [address], [place], [place] [address]  Urinalysis, Routine w reflex microscopic     Status: Abnormal   Collection Time: 07/31/19  2:35 AM  Result Value Ref Range   Color, Urine AMBER (A) YELLOW    Comment: BIOCHEMICALS MAY BE AFFECTED BY COLOR   APPearance HAZY (A) CLEAR   Specific Gravity, Urine >1.046 (H) 1.005 - 1.030   pH 5.0 5.0 - 8.0   Glucose, UA NEGATIVE NEGATIVE mg/dL  Hgb urine dipstick NEGATIVE NEGATIVE   Bilirubin Urine NEGATIVE NEGATIVE   Ketones, ur NEGATIVE NEGATIVE mg/dL   Protein, ur 30 (A) NEGATIVE mg/dL   Nitrite NEGATIVE NEGATIVE   Leukocytes,Ua NEGATIVE NEGATIVE   RBC / HPF 0-5 0 - 5 RBC/hpf   WBC, UA 0-5 0 - 5 WBC/hpf   Bacteria, UA MANY (A) NONE SEEN   Squamous Epithelial / LPF 0-5 0 - 5   Mucus PRESENT    Hyaline Casts, UA PRESENT    Sperm, UA PRESENT     Comment: Performed at [facility], [place] [address], [place], [place] [address]  Comprehensive metabolic panel     Status: Abnormal   Collection Time: 07/31/19  5:54 AM  Result Value Ref Range   Sodium 137 135 - 145 mmol/L   Potassium 3.6 3.5 - 5.1 mmol/L   Chloride 111 98 - 111 mmol/L   CO2 18 (L) 22 - 32 mmol/L   Glucose, Bld 149 (H) 70 - 99 mg/dL   BUN 29 (H) 8 - 23 mg/dL   Creatinine, Ser 1.12  0.61 - 1.24 mg/dL   Calcium 7.5 (L) 8.9 - 10.3 mg/dL   Total Protein 5.1 (L) 6.5 - 8.1 g/dL   Albumin 1.8 (L) 3.5 - 5.0 g/dL   AST 27 15 - 41 U/L   ALT 15 0 - 44 U/L   Alkaline Phosphatase 180 (H) 38 - 126 U/L   Total Bilirubin 0.5 0.3 - 1.2 mg/dL   GFR calc non Af Amer >60 >60 mL/min   GFR calc Af Amer >60 >60 mL/min   Anion gap 8 5 - 15    Comment: Performed at [facility], [place] [address] [place], [place] [address]  CBC     Status: Abnormal   Collection Time: 07/31/19  5:54 AM  Result Value Ref Range   WBC 21.3 (H) 4.0 - 10.5 K/uL   RBC 2.70 (L) 4.22 - 5.81 MIL/uL   Hemoglobin 7.5 (L) 13.0 - 17.0 g/dL   HCT 24.3 (L) 39.0 - 52.0 %   MCV 90.0 80.0 - 100.0 fL   MCH 27.8 26.0 - 34.0 pg   MCHC 30.9 30.0 - 36.0 g/dL   RDW 15.6 (H) 11.5 - 15.5 %   Platelets 382 150 - 400 K/uL   nRBC 0.2 0.0 - 0.2 %    Comment: Performed at [facility], [place] [address] [place], [place] [address]  Type and screen [place]     Status: None   Collection Time: 07/31/19  8:41 AM  Result Value Ref Range   ABO/RH(D) O POS    Antibody Screen NEG    Sample Expiration      08/03/2019,2359 Performed at [facility], [place] [address], [place], [place] [address]   CBC     Status: Abnormal   Collection Time: 07/31/19  8:41 AM  Result Value Ref Range   WBC 24.6 (H) 4.0 - 10.5 K/uL   RBC 3.01 (L) 4.22 - 5.81 MIL/uL   Hemoglobin 8.3 (L) 13.0 - 17.0 g/dL   HCT 26.9 (L) 39.0 - 52.0 %   MCV 89.4 80.0 - 100.0 fL   MCH 27.6 26.0 - 34.0 pg   MCHC 30.9 30.0 - 36.0 g/dL   RDW 15.6 (H) 11.5 - 15.5 %   Platelets 396 150 - 400 K/uL   nRBC 0.2 0.0 - 0.2 %    Comment: Performed at [facility], [place] [address] [place], [place] [address]  ABO/Rh     Status: None   Collection Time: 07/31/19  8:41 AM  Result Value Ref Range   ABO/RH(D)      O POS Performed at [facility] [address] [place],  [address]     CT Angio Chest PE W and/or Wo  Contrast  Result Date: 07/30/2019 CLINICAL DATA:  Shortness of breath and near syncopal episode, mass lesion on recent chest x-ray EXAM: CT ANGIOGRAPHY CHEST CT ABDOMEN AND PELVIS WITH CONTRAST TECHNIQUE: Multidetector CT imaging of the chest was performed using the standard protocol during bolus administration of intravenous contrast. Multiplanar CT image reconstructions and MIPs were obtained to evaluate the vascular anatomy. Multidetector CT imaging of the abdomen and pelvis was performed using the standard protocol during bolus administration of intravenous contrast. CONTRAST:  120mL OMNIPAQUE IOHEXOL 350 MG/ML SOLN COMPARISON:  Prior chest x-ray from earlier in the same day CT from 03/30/2018. FINDINGS: CTA CHEST FINDINGS Cardiovascular: Thoracic aorta demonstrates atherosclerotic calcifications without aneurysmal dilatation. No cardiac enlargement is noted. Mild coronary calcifications are noted. The pulmonary artery is well visualized within normal enhancement pattern. No filling defect to suggest pulmonary embolism is seen. Mediastinum/Nodes: Thoracic inlet shows a hypodensity within the left lobe of the thyroid. This measures approximately 14 mm in greatest dimension. The esophagus as visualized is within normal limits. Scattered small mediastinal lymph nodes are noted primarily within the AP window. Small hilar adenopathy is noted bilaterally. Lungs/Pleura: In the left upper lobe there is a focal soft tissue mass lesion which measures approximately 11.8 x 8.2 cm in greatest AP and transverse dimensions respectively. It has some extension to the left hilum and there are associated changes involving the posterolateral aspect of the left third rib consistent with localized invasion and metastatic disease. There also appears to be soft tissue density between the third and fourth ribs laterally consistent with chest wall invasion. Emphysematous changes are noted with evidence of subpleural fibrosis similar to  that seen on prior chest x-rays. 7 mm nodule is noted in the right upper lobe best seen on image number 76 of series 4. This is stable from a prior CT examination. Musculoskeletal: As described above there are destructive changes of the posterolateral aspect of the left third rib consistent with localized neoplastic invasion. No other specific rib abnormality is noted. The thoracic spine demonstrates degenerative change without lytic lesion or compression deformity. Review of the MIP images confirms the above findings. CT ABDOMEN and PELVIS FINDINGS Hepatobiliary: Gallbladder is well distended. Mild biliary ductal dilatation is seen secondary to changes of chronic pancreatitis in the head of the pancreas. These changes are similar to that seen on prior abdominal CT. The liver demonstrates some fatty infiltration. No definitive mass is seen. Pancreas: Chronic changes are noted in the pancreas with pancreatic ductal dilatation stable from the prior exam. Multiple calcifications are seen in the head and uncinate process consistent with prior chronic pancreatitis. Previously seen pseudocyst in the head of the pancreas is not as well visualized on today's exam. Spleen: Surgically removed Adrenals/Urinary Tract: Adrenal glands are within normal limits. The kidneys demonstrate a normal enhancement pattern bilaterally. No definitive calculi are seen. Delayed images demonstrate dense nephrograms without significant contrast enhancement. This is of uncertain significance but may be related to incomplete delayed images. No elevated creatinine is seen. The bladder is partially distended. Stomach/Bowel: No obstructive or inflammatory changes of the colon are seen. The appendix is  not well visualized. No obstructive changes of the small bowel are noted. Stomach is within normal limits. Vascular/Lymphatic: Aortic atherosclerosis. No enlarged abdominal or pelvic lymph nodes. Reproductive: Prostate is unremarkable. Other: No  abdominal wall hernia or abnormality. No abdominopelvic ascites. Musculoskeletal: Degenerative changes of lumbar spine are noted. No metastatic lesions are seen. Review of the MIP images confirms the above findings. IMPRESSION: Large left upper lobe mass lesion with apparent chest wall invasion in rib destruction as described consistent with a primary pulmonary neoplasm till proven otherwise. Associated mediastinal and hilar adenopathy is seen. Stable nodule in the right upper lobe unchanged from 2019. No evidence of pulmonary emboli. Hypodense lesion within the left lobe of the thyroid. No followup recommended (ref: J Am Coll Radiol. 2015 Feb;12(2): 143-50). Chronic changes in the pancreas consistent with chronic pancreatitis. Pancreatic ductal dilatation in biliary ductal dilatation is seen. Chronic changes in the abdomen and pelvis stable from previous exams. Electronically Signed   By: Inez Catalina M.D.   On: 07/30/2019 20:17   CT ABDOMEN PELVIS W CONTRAST  Result Date: 07/30/2019 CLINICAL DATA:  Shortness of breath and near syncopal episode, mass lesion on recent chest x-ray EXAM: CT ANGIOGRAPHY CHEST CT ABDOMEN AND PELVIS WITH CONTRAST TECHNIQUE: Multidetector CT imaging of the chest was performed using the standard protocol during bolus administration of intravenous contrast. Multiplanar CT image reconstructions and MIPs were obtained to evaluate the vascular anatomy. Multidetector CT imaging of the abdomen and pelvis was performed using the standard protocol during bolus administration of intravenous contrast. CONTRAST:  161mL OMNIPAQUE IOHEXOL 350 MG/ML SOLN COMPARISON:  Prior chest x-ray from earlier in the same day CT from 03/30/2018. FINDINGS: CTA CHEST FINDINGS Cardiovascular: Thoracic aorta demonstrates atherosclerotic calcifications without aneurysmal dilatation. No cardiac enlargement is noted. Mild coronary calcifications are noted. The pulmonary artery is well visualized within normal  enhancement pattern. No filling defect to suggest pulmonary embolism is seen. Mediastinum/Nodes: Thoracic inlet shows a hypodensity within the left lobe of the thyroid. This measures approximately 14 mm in greatest dimension. The esophagus as visualized is within normal limits. Scattered small mediastinal lymph nodes are noted primarily within the AP window. Small hilar adenopathy is noted bilaterally. Lungs/Pleura: In the left upper lobe there is a focal soft tissue mass lesion which measures approximately 11.8 x 8.2 cm in greatest AP and transverse dimensions respectively. It has some extension to the left hilum and there are associated changes involving the posterolateral aspect of the left third rib consistent with localized invasion and metastatic disease. There also appears to be soft tissue density between the third and fourth ribs laterally consistent with chest wall invasion. Emphysematous changes are noted with evidence of subpleural fibrosis similar to that seen on prior chest x-rays. 7 mm nodule is noted in the right upper lobe best seen on image number 76 of series 4. This is stable from a prior CT examination. Musculoskeletal: As described above there are destructive changes of the posterolateral aspect of the left third rib consistent with localized neoplastic invasion. No other specific rib abnormality is noted. The thoracic spine demonstrates degenerative change without lytic lesion or compression deformity. Review of the MIP images confirms the above findings. CT ABDOMEN and PELVIS FINDINGS Hepatobiliary: Gallbladder is well distended. Mild biliary ductal dilatation is seen secondary to changes of chronic pancreatitis in the head of the pancreas. These changes are similar to that seen on prior abdominal CT. The liver demonstrates some fatty infiltration. No definitive mass is seen. Pancreas: Chronic changes  are noted in the pancreas with pancreatic ductal dilatation stable from the prior exam.  Multiple calcifications are seen in the head and uncinate process consistent with prior chronic pancreatitis. Previously seen pseudocyst in the head of the pancreas is not as well visualized on today's exam. Spleen: Surgically removed Adrenals/Urinary Tract: Adrenal glands are within normal limits. The kidneys demonstrate a normal enhancement pattern bilaterally. No definitive calculi are seen. Delayed images demonstrate dense nephrograms without significant contrast enhancement. This is of uncertain significance but may be related to incomplete delayed images. No elevated creatinine is seen. The bladder is partially distended. Stomach/Bowel: No obstructive or inflammatory changes of the colon are seen. The appendix is not well visualized. No obstructive changes of the small bowel are noted. Stomach is within normal limits. Vascular/Lymphatic: Aortic atherosclerosis. No enlarged abdominal or pelvic lymph nodes. Reproductive: Prostate is unremarkable. Other: No abdominal wall hernia or abnormality. No abdominopelvic ascites. Musculoskeletal: Degenerative changes of lumbar spine are noted. No metastatic lesions are seen. Review of the MIP images confirms the above findings. IMPRESSION: Large left upper lobe mass lesion with apparent chest wall invasion in rib destruction as described consistent with a primary pulmonary neoplasm till proven otherwise. Associated mediastinal and hilar adenopathy is seen. Stable nodule in the right upper lobe unchanged from 2019. No evidence of pulmonary emboli. Hypodense lesion within the left lobe of the thyroid. No followup recommended (ref: J Am Coll Radiol. 2015 Feb;12(2): 143-50). Chronic changes in the pancreas consistent with chronic pancreatitis. Pancreatic ductal dilatation in biliary ductal dilatation is seen. Chronic changes in the abdomen and pelvis stable from previous exams. Electronically Signed   By: Inez Catalina M.D.   On: 07/30/2019 20:17   DG Chest Portable 1  View  Result Date: 07/30/2019 CLINICAL DATA:  Hypotension. EXAM: PORTABLE CHEST 1 VIEW COMPARISON:  December 29, 2018 FINDINGS: Mild, chronic appearing increased interstitial lung markings are noted. A 10.7 cm x 8.7 cm masslike opacity is seen within the left upper lobe. This is increased in severity when compared to the prior study. There is apparent cortical destruction of the adjacent portion of the third left rib. This represents a new finding compared to the prior exam. There is no evidence of a pleural effusion or pneumothorax. The heart size and mediastinal contours are within normal limits. IMPRESSION: 1. Large opacity within the left upper lobe suspicious for the presence of a large lung mass. The presence of adjacent cortical destruction of the third left rib is suggestive of malignant properties. Correlation with chest CT is recommended. Electronically Signed   By: Virgina Norfolk M.D.   On: 07/30/2019 17:57    Review of Systems  Constitutional: Positive for activity change and unexpected weight change.  HENT: Negative for trouble swallowing and voice change.   Eyes: Negative for visual disturbance.  Respiratory: Positive for cough, shortness of breath and wheezing.   Cardiovascular: Positive for chest pain.  Gastrointestinal: Positive for abdominal pain.  Genitourinary: Negative for difficulty urinating and dysuria.  Musculoskeletal: Positive for arthralgias and back pain.  Neurological: Positive for dizziness. Negative for seizures and weakness.  Hematological: Bruises/bleeds easily.   Blood pressure 109/67, pulse 96, temperature (!) 97.3 F (36.3 C), temperature source Oral, resp. rate (!) 22, height 6\' 3"  (1.905 m), weight 74.4 kg, SpO2 92 %. Physical Exam  Vitals reviewed. Constitutional: He is oriented to person, place, and time.  Ill-appearing  HENT:  Fungating lesion on left ear  Eyes: Pupils are equal, round, and reactive to  light. EOM are normal.  Cardiovascular: Normal  rate, regular rhythm and normal heart sounds.  No murmur heard. Respiratory: Effort normal. No respiratory distress. He has no wheezes.  GI: Soft. He exhibits no distension. There is no abdominal tenderness.  Musculoskeletal:        General: No edema.  Lymphadenopathy:    He has no cervical adenopathy.  Neurological: He is alert and oriented to person, place, and time. No cranial nerve deficit. He exhibits normal muscle tone.  Skin: Skin is warm and dry.    Assessment/Plan: Timothy Martinez is a 69 year old man with a history of tobacco and ethanol abuse.  He has a history of COPD, chronic pancreatitis, chronic atrial fibrillation for which he is on anticoagulation, hypertension, hyperlipidemia, chronic back pain, and arthritis.  He presents with a several week history of left shoulder pain and new onset dizziness.  He was found to be anemic with an elevated white blood cell count.  He also was found to have an 11.8 x 8.2 cm left upper lobe mass with chest wall invasion along with mediastinal and hilar adenopathy.  Findings are consistent with advanced stage lung cancer, T4, N3, stage IIIC by CT scan.  He needs a tissue diagnosis in order to guide therapy.  I recommended that we proceed with bronchoscopy and endobronchial ultrasound for diagnostic and staging purposes.  I informed him of the general nature of the procedure.  He has had endoscopic procedures previously.  We would plan to do this under general anesthesia.  I informed him of the indications, risks, benefits, and alternatives.  He understands there is no guarantee of a definitive diagnosis.  He understands the risks include, but not limited to death, MI, DVT, PE, bleeding, loss of airway, pneumothorax, failure to make a diagnosis, as well as possibility of other unforeseeable complications.  He accepts the risks and agrees to proceed.  He is on Xarelto for his chronic atrial fibrillation.  He needs to be off that for 24 hours prior to  the procedure.  We will plan bronchoscopy and endobronchial ultrasound on Thursday, 08/01/2019.  [name] 07/31/2019, 11:45 AM

## 2019-07-31 NOTE — Evaluation (Addendum)
Physical Therapy Evaluation Patient Details Name: Timothy Martinez MRN: [id number] DOB: 03-06-1951 Today's Date: 07/31/2019   History of Present Illness  69 yo man with a history of tobacco abuse, COPD, atrial fibrillation, atrial flutter, ethanol abuse, chronic pancreatitis, hypertension, hyperlipidemia, and chronic back pain. Admitted due to dizziness with hypotension, weakness and anemia  CT of the chest confirmed a large left upper lobe mass with chest wall invasion along with mediastinal and hilar adenopathy.  Clinical Impression  Patient presents with mobility limited due to pain, decreased activity tolerance and decreased safety awareness.  Currently needs S for safety with in room mobility, but lives alone in second level apartment with 17 steps to enter.  Patient will benefit from skilled PT in the acute setting to maximize safety and independence for d/c home with intermittent family support and likely no follow up PT needs at this time.     Follow Up Recommendations Supervision - Intermittent;No PT follow up    Equipment Recommendations  None recommended by PT    Recommendations for Other Services       Precautions / Restrictions Precautions Precautions: Fall Precaution Comments: reports has fallen down 7 of the 17 steps to his apartment, but denies injury Restrictions Other Position/Activity Restrictions: check BP      Mobility  Bed Mobility Overal bed mobility: Needs Assistance Bed Mobility: Supine to Sit     Supine to sit: Supervision     General bed mobility comments: assist for safety with lines as not managing  Transfers Overall transfer level: Modified independent Equipment used: None                Ambulation/Gait Ambulation/Gait assistance: Supervision Gait Distance (Feet): 4 Feet Assistive device: None Gait Pattern/deviations: Step-to pattern     General Gait Details: bed to w/c only due to pt focused on getting to room and c/o pain despite  pre-medication  Stairs            Wheelchair Mobility    Modified Rankin (Stroke Patients Only)       Balance Overall balance assessment: Needs assistance   Sitting balance-Leahy Scale: Good       Standing balance-Leahy Scale: Fair Standing balance comment: at least fair, not formally tested                             Pertinent Vitals/Pain Pain Assessment: Faces Pain Score: 6  Faces Pain Scale: Hurts whole lot Pain Location: back, neck, shoulder Pain Descriptors / Indicators: Grimacing;Guarding;Discomfort;Aching Pain Intervention(s): Monitored during session;Repositioned;Premedicated before session;Limited activity within patient's tolerance    Home Living Family/patient expects to be discharged to:: Private residence Living Arrangements: Alone Available Help at Discharge: Family Type of Home: Apartment(above someone's house) Home Access: Stairs to enter Entrance Stairs-Rails: Chemical engineer of Steps: 17 Home Layout: One level Home Equipment: Cane - single point      Prior Function Level of Independence: Independent               Hand Dominance        Extremity/Trunk Assessment   Upper Extremity Assessment Upper Extremity Assessment: Defer to OT evaluation(L shoulder pain)    Lower Extremity Assessment Lower Extremity Assessment: Generalized weakness       Communication   Communication: No difficulties  Cognition Arousal/Alertness: Awake/alert Behavior During Therapy: Anxious Overall Cognitive Status: No family/caregiver present to determine baseline cognitive functioning  General Comments: distracted by pain, limited participation due to focused on getting to new room, wants to wait to use bathroom when he gets up there, minmal focus on task      General Comments General comments (skin integrity, edema, etc.): patient focused on pain, dealing with weight of  his recent diagnosis and contemplating what treatments he can tolerate.  Reports he cannot stand to lose any more weight and that he knows chemo would kill him as it killed his sister.    Exercises     Assessment/Plan    PT Assessment Patient needs continued PT services  PT Problem List Decreased strength;Decreased activity tolerance;Decreased mobility;Pain;Decreased safety awareness       PT Treatment Interventions DME instruction;Stair training;Therapeutic activities;Gait training;Functional mobility training;Therapeutic exercise;Patient/family education    PT Goals (Current goals can be found in the Care Plan section)  Acute Rehab PT Goals Patient Stated Goal: to return to independent PT Goal Formulation: With patient Time For Goal Achievement: 08/14/19 Potential to Achieve Goals: Good    Frequency Min 3X/week   Barriers to discharge Decreased caregiver support;Inaccessible home environment 17 step to enter apartment, lives alone    Co-evaluation               AM-PAC PT "6 Clicks" Mobility  Outcome Measure Help needed turning from your back to your side while in a flat bed without using bedrails?: None Help needed moving from lying on your back to sitting on the side of a flat bed without using bedrails?: None Help needed moving to and from a bed to a chair (including a wheelchair)?: A Little Help needed standing up from a chair using your arms (e.g., wheelchair or bedside chair)?: A Little Help needed to walk in hospital room?: A Little   6 Click Score: 17    End of Session   Activity Tolerance: Patient limited by pain Patient left: in chair(in w/c for transport) Nurse Communication: Mobility status PT Visit Diagnosis: Difficulty in walking, not elsewhere classified (R26.2);Pain Pain - Right/Left: Left Pain - part of body: Shoulder    Time: 6503-5465 PT Time Calculation (min) (ACUTE ONLY): 15 min   Charges:   PT Evaluation $PT Eval Moderate Complexity:  [facility], [place] Acute Rehabilitation Services [phone] 07/31/2019   Reginia Naas 07/31/2019, 5:07 PM

## 2019-07-31 NOTE — Progress Notes (Addendum)
Patient admission status changed to inpatient given need for ongoing evaluation for lung mass, hypotension, and symptomatic anemia.

## 2019-07-31 NOTE — ED Notes (Signed)
Pt BP dropped to 80 systolic, pt asymptomatic, pt given 500cc fluid bolus, admitting paged for new orders.

## 2019-07-31 NOTE — Progress Notes (Signed)
Spoke with patient's daughter, [name],  who is aware of Mr. Grisso procedure tomorrow.   [name], DO PGY-1, [facility] 07/31/2019 1:20 PM

## 2019-08-01 ENCOUNTER — Encounter (HOSPITAL_COMMUNITY): Payer: Self-pay | Admitting: Anesthesiology

## 2019-08-01 ENCOUNTER — Other Ambulatory Visit: Payer: Self-pay | Admitting: *Deleted

## 2019-08-01 ENCOUNTER — Encounter (HOSPITAL_COMMUNITY)
Admission: EM | Disposition: A | Discharge status: Home Health | Payer: Self-pay | Source: Home / Self Care | Attending: Family Medicine

## 2019-08-01 ENCOUNTER — Other Ambulatory Visit: Payer: Self-pay

## 2019-08-01 ENCOUNTER — Inpatient Hospital Stay (HOSPITAL_COMMUNITY): Payer: Medicare Other

## 2019-08-01 DIAGNOSIS — D649 Anemia, unspecified: Secondary | ICD-10-CM

## 2019-08-01 DIAGNOSIS — I4819 Other persistent atrial fibrillation: Secondary | ICD-10-CM

## 2019-08-01 DIAGNOSIS — J9621 Acute and chronic respiratory failure with hypoxia: Secondary | ICD-10-CM

## 2019-08-01 DIAGNOSIS — Z01810 Encounter for preprocedural cardiovascular examination: Secondary | ICD-10-CM

## 2019-08-01 DIAGNOSIS — R0902 Hypoxemia: Secondary | ICD-10-CM

## 2019-08-01 LAB — CBC WITH DIFFERENTIAL/PLATELET
Abs Immature Granulocytes: 0.15 10*3/uL — ABNORMAL HIGH (ref 0.00–0.07)
Abs Immature Granulocytes: 0 10*3/uL (ref 0.00–0.07)
Basophils Absolute: 0.1 10*3/uL (ref 0.0–0.1)
Basophils Absolute: 0.4 10*3/uL — ABNORMAL HIGH (ref 0.0–0.1)
Basophils Relative: 1 %
Basophils Relative: 2 %
Eosinophils Absolute: 0.1 10*3/uL (ref 0.0–0.5)
Eosinophils Absolute: 0.2 10*3/uL (ref 0.0–0.5)
Eosinophils Relative: 1 %
Eosinophils Relative: 1 %
HCT: 26.1 % — ABNORMAL LOW (ref 39.0–52.0)
HCT: 25.6 % — ABNORMAL LOW (ref 39.0–52.0)
Hemoglobin: 8.4 g/dL — ABNORMAL LOW (ref 13.0–17.0)
Hemoglobin: 8.2 g/dL — ABNORMAL LOW (ref 13.0–17.0)
Immature Granulocytes: 1 %
Lymphocytes Relative: 5 %
Lymphocytes Relative: 4 %
Lymphs Abs: 1.1 10*3/uL (ref 0.7–4.0)
Lymphs Abs: 0.8 10*3/uL (ref 0.7–4.0)
MCH: 27.9 pg (ref 26.0–34.0)
MCH: 27.5 pg (ref 26.0–34.0)
MCHC: 32.2 g/dL (ref 30.0–36.0)
MCHC: 32 g/dL (ref 30.0–36.0)
MCV: 86.7 fL (ref 80.0–100.0)
MCV: 85.9 fL (ref 80.0–100.0)
Monocytes Absolute: 2.1 10*3/uL — ABNORMAL HIGH (ref 0.1–1.0)
Monocytes Absolute: 1.1 10*3/uL — ABNORMAL HIGH (ref 0.1–1.0)
Monocytes Relative: 10 %
Monocytes Relative: 6 %
Neutro Abs: 18.5 10*3/uL — ABNORMAL HIGH (ref 1.7–7.7)
Neutro Abs: 16.6 10*3/uL — ABNORMAL HIGH (ref 1.7–7.7)
Neutrophils Relative %: 82 %
Neutrophils Relative %: 87 %
Platelets: 403 10*3/uL — ABNORMAL HIGH (ref 150–400)
Platelets: 407 10*3/uL — ABNORMAL HIGH (ref 150–400)
RBC: 3.01 MIL/uL — ABNORMAL LOW (ref 4.22–5.81)
RBC: 2.98 MIL/uL — ABNORMAL LOW (ref 4.22–5.81)
RDW: 15.8 % — ABNORMAL HIGH (ref 11.5–15.5)
RDW: 15.6 % — ABNORMAL HIGH (ref 11.5–15.5)
WBC: 22.1 10*3/uL — ABNORMAL HIGH (ref 4.0–10.5)
WBC: 19.1 10*3/uL — ABNORMAL HIGH (ref 4.0–10.5)
nRBC: 0.3 % — ABNORMAL HIGH (ref 0.0–0.2)
nRBC: 0.6 % — ABNORMAL HIGH (ref 0.0–0.2)
nRBC: 3 /100 WBC — ABNORMAL HIGH

## 2019-08-01 LAB — CBC
HCT: 26.8 % — ABNORMAL LOW (ref 39.0–52.0)
Hemoglobin: 8.5 g/dL — ABNORMAL LOW (ref 13.0–17.0)
MCH: 27.6 pg (ref 26.0–34.0)
MCHC: 31.7 g/dL (ref 30.0–36.0)
MCV: 87 fL (ref 80.0–100.0)
Platelets: 388 10*3/uL (ref 150–400)
RBC: 3.08 MIL/uL — ABNORMAL LOW (ref 4.22–5.81)
RDW: 15.8 % — ABNORMAL HIGH (ref 11.5–15.5)
WBC: 22.1 10*3/uL — ABNORMAL HIGH (ref 4.0–10.5)
nRBC: 0.4 % — ABNORMAL HIGH (ref 0.0–0.2)

## 2019-08-01 LAB — COMPREHENSIVE METABOLIC PANEL
ALT: 19 U/L (ref 0–44)
AST: 32 U/L (ref 15–41)
Albumin: 1.7 g/dL — ABNORMAL LOW (ref 3.5–5.0)
Alkaline Phosphatase: 229 U/L — ABNORMAL HIGH (ref 38–126)
Anion gap: 10 (ref 5–15)
BUN: 17 mg/dL (ref 8–23)
CO2: 19 mmol/L — ABNORMAL LOW (ref 22–32)
Calcium: 7.9 mg/dL — ABNORMAL LOW (ref 8.9–10.3)
Chloride: 111 mmol/L (ref 98–111)
Creatinine, Ser: 0.85 mg/dL (ref 0.61–1.24)
GFR calc Af Amer: >60 mL/min (ref >60)
GFR calc non Af Amer: >60 mL/min (ref >60)
Glucose, Bld: 115 mg/dL — ABNORMAL HIGH (ref 70–99)
Potassium: 4 mmol/L (ref 3.5–5.1)
Sodium: 140 mmol/L (ref 135–145)
Total Bilirubin: 1.3 mg/dL — ABNORMAL HIGH (ref 0.3–1.2)
Total Protein: 5.1 g/dL — ABNORMAL LOW (ref 6.5–8.1)

## 2019-08-01 LAB — INFLUENZA PANEL BY PCR (TYPE A & B)
Influenza A By PCR: NEGATIVE
Influenza B By PCR: NEGATIVE

## 2019-08-01 LAB — HEPARIN LEVEL (UNFRACTIONATED): Heparin Unfractionated: 0.55 IU/mL (ref 0.30–0.70)

## 2019-08-01 LAB — MRSA PCR SCREENING: MRSA by PCR: NEGATIVE

## 2019-08-01 LAB — APTT: aPTT: 71 seconds — ABNORMAL HIGH (ref 24–36)

## 2019-08-01 LAB — GAMMA GT: GGT: 93 U/L — ABNORMAL HIGH (ref 7–50)

## 2019-08-01 LAB — GLUCOSE, CAPILLARY: Glucose-Capillary: 96 mg/dL (ref 70–99)

## 2019-08-01 LAB — SAVE SMEAR(SSMR), FOR PROVIDER SLIDE REVIEW

## 2019-08-01 SURGERY — BRONCHOSCOPY, WITH EBUS
Anesthesia: General

## 2019-08-01 MED ORDER — METOPROLOL TARTRATE 50 MG PO TABS
50.0000 mg | ORAL_TABLET | Freq: Three times a day (TID) | ORAL | Status: DC
  Administered 2019-08-01 (×2): 50 mg via ORAL
  Filled 2019-08-01 (×3): qty 1

## 2019-08-01 MED ORDER — AMIODARONE HCL IN DEXTROSE 360-4.14 MG/200ML-% IV SOLN: 60.0000 mg/h | INTRAVENOUS | Status: DC

## 2019-08-01 MED ORDER — METOPROLOL TARTRATE 5 MG/5ML IV SOLN
5.0000 mg | Freq: Once | INTRAVENOUS | Status: AC
  Administered 2019-08-01: 5 mg via INTRAVENOUS
  Filled 2019-08-01: qty 5

## 2019-08-01 MED ORDER — AMIODARONE HCL IN DEXTROSE 360-4.14 MG/200ML-% IV SOLN: 30.0000 mg/h | INTRAVENOUS | Status: DC

## 2019-08-01 MED ORDER — METOPROLOL TARTRATE 5 MG/5ML IV SOLN
2.5000 mg | INTRAVENOUS | Status: DC | PRN
  Administered 2019-08-01 – 2019-08-02 (×2): 2.5 mg via INTRAVENOUS
  Filled 2019-08-01 (×2): qty 5

## 2019-08-01 MED ORDER — LEVALBUTEROL HCL 1.25 MG/0.5ML IN NEBU
1.2500 mg | INHALATION_SOLUTION | Freq: Four times a day (QID) | RESPIRATORY_TRACT | Status: DC
  Administered 2019-08-01: 1.25 mg via RESPIRATORY_TRACT
  Filled 2019-08-01: qty 0.5

## 2019-08-01 MED ORDER — HEPARIN (PORCINE) 25000 UT/250ML-% IV SOLN
2300.0000 [IU]/h | INTRAVENOUS | Status: DC
  Administered 2019-08-01 – 2019-08-02 (×2): 1100 [IU]/h via INTRAVENOUS
  Administered 2019-08-02: 1400 [IU]/h via INTRAVENOUS
  Administered 2019-08-04 – 2019-08-05 (×2): 2300 [IU]/h via INTRAVENOUS
  Filled 2019-08-01 (×6): qty 250

## 2019-08-01 MED ORDER — RIVAROXABAN 20 MG PO TABS: 20.0000 mg | ORAL_TABLET | Freq: Every day | ORAL | Status: DC

## 2019-08-01 MED ORDER — HEPARIN (PORCINE) 25000 UT/250ML-% IV SOLN
1100.0000 [IU]/h | INTRAVENOUS | Status: DC
  Administered 2019-08-01: 1100 [IU]/h via INTRAVENOUS
  Filled 2019-08-01: qty 250

## 2019-08-01 MED ORDER — DOXYCYCLINE HYCLATE 100 MG PO TABS
100.0000 mg | ORAL_TABLET | Freq: Two times a day (BID) | ORAL | Status: DC
  Administered 2019-08-01 – 2019-08-03 (×5): 100 mg via ORAL
  Filled 2019-08-01 (×5): qty 1

## 2019-08-01 MED ORDER — LEVALBUTEROL HCL 1.25 MG/0.5ML IN NEBU
1.2500 mg | INHALATION_SOLUTION | Freq: Four times a day (QID) | RESPIRATORY_TRACT | Status: DC | PRN
  Administered 2019-08-05: 1.25 mg via RESPIRATORY_TRACT
  Filled 2019-08-01: qty 0.5

## 2019-08-01 MED ORDER — SODIUM CHLORIDE 0.9 % IV SOLN: Freq: Once | INTRAVENOUS | Status: DC

## 2019-08-01 MED ORDER — SODIUM CHLORIDE 0.9 % IV SOLN: INTRAVENOUS | Status: AC

## 2019-08-01 MED ORDER — VANCOMYCIN HCL IN DEXTROSE 1-5 GM/200ML-% IV SOLN: 1000.0000 mg | Freq: Two times a day (BID) | INTRAVENOUS | Status: DC

## 2019-08-01 MED ORDER — AMIODARONE LOAD VIA INFUSION
150.0000 mg | Freq: Once | INTRAVENOUS | Status: DC
  Filled 2019-08-01: qty 83.34

## 2019-08-01 MED ORDER — VANCOMYCIN HCL 1500 MG/300ML IV SOLN
1500.0000 mg | Freq: Once | INTRAVENOUS | Status: DC
  Administered 2019-08-01: 1500 mg via INTRAVENOUS
  Filled 2019-08-01: qty 300

## 2019-08-01 MED ORDER — HEPARIN BOLUS VIA INFUSION
3000.0000 [IU] | Freq: Once | INTRAVENOUS | Status: AC
  Administered 2019-08-01: 3000 [IU] via INTRAVENOUS
  Filled 2019-08-01: qty 3000

## 2019-08-01 MED ORDER — AMIODARONE IV BOLUS ONLY 150 MG/100ML
150.0000 mg | Freq: Once | INTRAVENOUS | Status: AC
  Administered 2019-08-01: 150 mg via INTRAVENOUS
  Filled 2019-08-01: qty 100

## 2019-08-01 NOTE — Progress Notes (Signed)
PT Cancellation Note  Patient Details Name: CRANDALL HARVEL MRN: [id number] DOB: 10-26-1950   Cancelled Treatment:    Reason Eval/Treat Not Completed: Medical issues which prohibited therapy;Other (comment)(RN asked PT to hold as pt has been in A-fib with mobility) Will check back tomorrow.   Christel Mormon, SPT   Silvana Newness 08/01/2019, 9:19 AM

## 2019-08-01 NOTE — Progress Notes (Signed)
OT Cancellation Note  Patient Details Name: Timothy Martinez MRN: [id number] DOB: 06-08-1951   Cancelled Treatment:    Reason Eval/Treat Not Completed: Medical issues which prohibited therapy.  Pt in A-Fib with RVR.  Will reattempt.  Nilsa Nutting., OTR/L Acute Rehabilitation Services Pager [phone] Office [phone]   Lucille Passy M 08/01/2019, 10:35 AM

## 2019-08-01 NOTE — Progress Notes (Signed)
Family Medicine Teaching Service Daily Progress Note Intern Pager: [phone]  Patient name: Timothy Martinez Medical record number: [id number] Date of birth: Sep 01, 1950 Age: 69 y.o. Gender: male  Primary Care Provider: [name], DO Consultants: CVTS   Code Status: Full   Pt Overview and Major Events to Date:  07/30/19: Admitted, CT Lung Mass with rib infiltration   Assessment and Plan: Timothy Martinez is a 69 y.o. male who presented dizziness and dyspnea. PMH is significant for chronic alcoholic pancreatitis, tobacco abuse, GERD, COPD, type II diabetes, AFIB, HFpEF, lumbar back pain, hyperlipidemia   Dyspnea  Lung Mass concerning for metastasis  Anemia  Patient required x3 doses of metoprolol overnight and has a no O2 requirement.  Satting well at 12 L with rales on exam.  CXR today with worsening with concerns for multilobar pneumonia. Will start doxycycline to cover to CAP. Consider Lasix and repeat CT Chest if patient declines.  Negative CT Chest for PE yesterday. Patient on heparin for AFIB with AVR.  Increasing oxygen requirement likely multifactorial. Possible obstructive pneumonia with enlarging small left partially loculated pleural effusion. CTVS following for large left upper lobe mass. If continues to clinically decline, pleural effusion may need to be drained.  Leukocytosis present (WBC 22). Hemoglobin stable (Hgb 8.4).  MRSA PCR negative. Influenza A/B negative. Reschedule video bronchoscopy per CTVS.  - Consulted CVTS, appreciate recommendations - Follow up differential and pathology smear  - Delayed video bronchoscopy w/ endobronchial [place]  - Vitals per unit routine  - Continuous pulse oximetry and telemetry  - PT / OT eval and treat  - Daily CBC w/ Diff  - Follow up GTT  - MRSA PCR negative  - Type and Screen ordered q72hr   AKI Cr 0.85  today, 0.76 on 12/4.  S/p 3x 1L bolus.  Discontinued IVF.  -Avoid nephrotoxic agents -CMP am   A. Fibrillation  RVR    Overnight AFIB RVRHR 150-160s requiring x3 doses of metoprolol. Increased O2 requirement. HR 163 this morning. Started Amdioarone load as patient has soft blood pressures. Consider rate controlling drip vs restarting home metoprolol. Cardiology consulted and will see today. HR 92-147   CHADsVASc score 3. Started on Heparin. Home meds: Metoprolol 75 mg BID, Xarelto 20 mg once daily - Cardiac Telemetry  - Hold home Metoprolol, due to concerns for hypotension  - Hold Xarelto  - Start Heparin   Diabetes Mellitus Glucose 115 today, last A1c 6.1 10/31/2017 Home meds: Metformin 1 g BID -Hold Metformin, no need for sliding scale at present -Monitor CBGs daily with BMP  Left ear ulcer See media for image.  Concern for skin cancer given chronic nature of this ulceration and location on a frequently sun exposed area. Patient reports wound has been present for 2-3 months.  -Xerform dressing, per Wound RN consultation  -Dermatology outpatient referral    Hyperlipidemia Home meds: Atorvastatin 20 mg once daily -Lipid panel  -Continue statin  HFpEF EF 55-60%with G1 DD on echo in 12/2014. Consider Lasix as rales heard on exam.  Follows with heart care, last seen in 2019 without concern.  - Cardiology consulted, to see today  -Monitor fluid status   COPD Continue to monitor respiratory status. Home meds: Dulera BID  -Continue Dulera -Albuterol PRN   GERD Home meds: Pantoprazole 40mg  once daily -Continue Protinix  - Pepcid 20 mg BID   Tobacco use Smokes 1 ppd since age 33, patient has cut down from 3 ppd -14 mg nicotine patch as  needed  Chronic pancreatitis 2/2 previous ETOH excess CT abdomen 07/30/19: Stable pancreatic ductal dilatation and multiple calcifications. Previously seen pseudocyst in head of the pancreas poorly visualized. ALP increasing.  Will obtain GTT to evaluate.  Home meds: Ensure supplements TID between meals, Norco/Vicodin 5-325 mg every 6 hourly, Creon 2 capsules  5 times daily, Zofran 4 mg every 8 hourly, Compazine 10 mg every 8 hourly -Continue home meds   Chronic lower back pain, chronic, stable Currently asymptomatic Home meds: hydrocodone, will continue - Scheduled Tylenol 650 mg q6h  - Norco 5mg  BID   Previous alcohol use: Stable Last drink was 1.5 years ago.  Encouraged patient to keep this up.  Hx of colonic polyps Three sessile polpys resected during colonoscopy in 2019. No evidence of malignancy   Constipation Home meds: MiraLAX, Magnesium oxide -Continue home meds  FEN/GI: Regular diet, Ensure TID, Multivitamin PPx: SCDs  Disposition: pending medical clearance and PT/OT eval    Subjective:  Timothy Martinez had AFIB with RVR requiring x3 metoprolol. Denies CP.  Required increasing oxygen requirement.    Objective: Temp:  [97.3 F (36.3 C)-98.9 F (37.2 C)] 98.9 F (37.2 C) (01/07 0807) Pulse Rate:  [92-163] 163 (01/07 0807) Resp:  [15-22] 22 (01/07 0807) BP: (90-136)/(56-99) 107/65 (01/07 0807) SpO2:  [83 %-94 %] 93 % (01/07 0807)   Physical Exam:  GEN: resting comfortably in bed CV:  Irregularly irregular rhythm, tachycardic, no murmurs appreciated, distal pulses intact  RESP: rales, no wheezes, tachypneic, 12 L [place] ABD: Soft, generalized tenderness, nondistended MSK: left shoulder mild tenderness to palpation  SKIN: warm, dry NEURO: grossly normal, moves all extremities appropriately PSYCH: Normal affect and thought content   Laboratory: Recent Labs  Lab 07/31/19 0554 07/31/19 0841 08/01/19 0821  WBC 21.3* 24.6* 22.1*  HGB 7.5* 8.3* 8.5*  HCT 24.3* 26.9* 26.8*  PLT 382 396 388   Recent Labs  Lab 07/30/19 1719 07/31/19 0554 08/01/19 0821  NA 136 137 140  K 4.1 3.6 4.0  CL 103 111 111  CO2 21* 18* 19*  BUN 25* 29* 17  CREATININE 1.20 1.12 0.85  CALCIUM 8.1* 7.5* 7.9*  PROT  --  5.1* 5.1*  BILITOT  --  0.5 1.3*  ALKPHOS  --  180* 229*  ALT  --  15 19  AST  --  27 32  GLUCOSE 164* 149*  115*      Imaging/Diagnostic Tests: DG CHEST PORT 1 VIEW  Result Date: 08/01/2019 CLINICAL DATA:  69 year old male with hypoxia. Oxygen desaturation. Shortness of breath. EXAM: PORTABLE CHEST 1 VIEW COMPARISON:  Chest x-ray 07/30/2019.  Chest CTA 07/30/2019. FINDINGS: Previously noted left upper lobe mass appears larger on today's examination, which could represent growth of the lesion, or may reflect surrounding postobstructive changes. This lesion is again invasive into the left chest wall with gross destruction of the posterolateral aspect of the left third rib. Enlarging small left pleural effusion which appears partially loculated in the periphery of the left base. Increasing areas of ill-defined opacity and interstitial prominence throughout the lungs bilaterally (left greater than right), likely reflects evolving multilobar pneumonia. Pulmonary vasculature is partially obscured, but there is no evidence to suggest frank pulmonary edema. Heart size is normal. Aortic atherosclerosis. IMPRESSION: 1. Interval worsening of aeration throughout the lungs bilaterally, concerning for developing multilobar pneumonia. 2. Enlargement of mass in left upper lobe which may reflect progression of the underlying neoplasm but also likely represents worsening adjacent postobstructive changes. Associated with this  there is an enlarging small left partially loculated pleural effusion. Electronically Signed   By: Vinnie Langton M.D.   On: 08/01/2019 08:12       [name], DO PGY-1, [facility] 08/01/2019 9:39 AM    FPTS Intern pager: [phone], text pages welcome

## 2019-08-01 NOTE — Progress Notes (Signed)
ANTICOAGULATION CONSULT NOTE - Initial Consult  Pharmacy Consult for Heparin Indication: atrial fibrillation  Allergies  Allergen Reactions  . Prozac [Fluoxetine Hcl] Nausea Only    Made the patient very sick to his stomach  . Morphine And Related Swelling    Pt states he can tolerate oxycodone and hydromorphone  . Penicillins Rash    Has patient had a PCN reaction causing immediate rash, facial/tongue/throat swelling, SOB or lightheadedness with hypotension: Yes Has patient had a PCN reaction causing severe rash involving mucus membranes or skin necrosis: No Has patient had a PCN reaction that required hospitalization: No Has patient had a PCN reaction occurring within the last 10 years: No If all of the above answers are "NO", then may proceed with Cephalosporin use.     Patient Measurements: Height: 6\' 3"  (190.5 cm) Weight: 164 lb (74.4 kg) IBW/kg (Calculated) : 84.5 Heparin Dosing Weight: 74.4 kg  Vital Signs: Temp: 98.1 F (36.7 C) (01/07 0700) Temp Source: Oral (01/07 0700) BP: 114/99 (01/07 0700) Pulse Rate: 156 (01/07 0700)  Labs: Recent Labs    07/30/19 1719 07/30/19 1924 07/31/19 0554 07/31/19 0841 07/31/19 1620  HGB 8.4*  --  7.5* 8.3*  --   HCT 26.8*  --  24.3* 26.9*  --   PLT 418*  --  382 396  --   APTT  --   --   --   --  49*  LABPROT  --   --   --   --  23.0*  INR  --   --   --   --  2.1*  CREATININE 1.20  --  1.12  --   --   TROPONINIHS 12 12  --   --   --     Estimated Creatinine Clearance: 67.4 mL/min (by C-G formula based on SCr of 1.12 mg/dL).   Medical History: Past Medical History:  Diagnosis Date  . Acute on chronic pancreatitis ([place]) 05/17/2016  . Alcohol abuse   . Alcoholic pancreatitis   . Anginal pain ([place])   . Anxiety and depression 01/20/2015   PHQ 9 = 11 (01/20/15)    . Arthritis    "eat up w/it" (05/17/2016)  . Atrial fibrillation status post cardioversion ([place]) 08/2014  . Atrial flutter, unspecified   . CHF (congestive  heart failure) ([place])   . Chronic anticoagulation   . Chronic atrial fibrillation ([place])   . Chronic bronchitis ([place])   . Chronic left-sided low back pain with sciatica 12/21/2012  . Chronic pancreatitis ([place])   . COLD (chronic obstructive lung disease) ([place])   . Complication of anesthesia    pt C/O confusion after procedure in April 2020  . Daily headache    "need eye examined" (05/17/2016)  . Gastric outlet obstruction 10/2018  . GERD (gastroesophageal reflux disease)   . Hematuria, microscopic 02/13/2015   Noted on UA - Recheck UA at next visit ~ 1 months   . Hyperlipidemia   . Hypertension   . Insomnia 02/11/2016  . Lateral epicondylitis of right elbow 03/17/2017  . Left foot pain 10/12/2013  . Loss of weight 02/19/2018  . Low back pain 12/21/2012  . Malnutrition of moderate degree 05/18/2016  . Neck pain, bilateral posterior 10/15/2014  . Neuropathy 03/16/2015  . Pain of joint of left ankle and foot 06/13/2018  . Pain of right upper extremity 05/30/2016  . Pancreatic pseudocyst    seen on CT scan 07/2014  . Pancreatitis 08/04/2014  . Permanent atrial fibrillation (  [place]) 05/05/2017  . Pneumonia    "?a few times" (05/17/2016)  . 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 Tobacco abuse 05/30/2011   Heavy smoker up to 3 ppd down to 15 cigs per day in 09/2014   . Type II diabetes mellitus ([place])   . Wears dentures   . Wears glasses     Medications:  Scheduled:  . acetaminophen  650 mg Oral Q6H  . atorvastatin  20 mg Oral q1800  . famotidine  20 mg Oral BID  . feeding supplement (ENSURE ENLIVE)  237 mL Oral TID BM  . gabapentin  100 mg Oral TID  . levalbuterol  1.25 mg Nebulization Q6H  . lipase/protease/amylase  72,000 Units Oral TID WC  . magnesium oxide  400 mg Oral Daily  . mometasone-formoterol  2 puff Inhalation BID  . multivitamin with minerals  1 tablet Oral Daily  . pantoprazole  40 mg Oral Daily  . polyethylene glycol  17 g Oral Daily    Assessment: MS is a 7 YOM presenting with  dizziness and dyspnea. PMH significant for atrial fibrillation on Xarelto PTA, alcoholic pancreatitis, GERD, COPD, T2DM, HF and HLD. Last dose of Xarelto was on 07/29/2019. Initiating Heparin due to increased respiratory needs and concern for inability to take PO medications. Pharmacy was previously consulted for heparin dosing on 07/31/2019, however heparin was not started due to drop in hemoglobin (8.4 > 7.5). No reports of active bleeding today. Pharmacy consulted today for heparin dosing due to improved hemoglobin, possible biopsy procedure and active AF with RVR.  07/31/2019: Hgb 8.3, PLT 396, APTT 49.   Goal of Therapy:  Heparin level 0.3-0.7 units/ml aPTT 66-102 seconds Monitor platelets by anticoagulation protocol: Yes   Plan:  Heparin IV bolus 3000 units Heparin IV drip 1100 units/hour Check 6hr HL and APTT @ 1500 Daily HL, CBC, APTT Monitor for s/sx of bleeding  Judene Companion, PharmD Candidate 08/01/2019,7:43 AM

## 2019-08-01 NOTE — Progress Notes (Signed)
Report given to RN Marya Amsler, pt will transfer via bed with O2 and his belongings.

## 2019-08-01 NOTE — Progress Notes (Addendum)
Interim progress note:   Noted Timothy Martinez continued to be in A. fib with RVR with heart rate maintaining around 140-150bpm.  His blood pressure is stable, however stays on the lower side and has had significant hypotension with metoprolol already during his stay.  He was scheduled for a video bronchoscopy this morning, however after going into RVR overnight with now a new oxygen requirement of 10 L HFNC since yesterday evening (previously RA), believe this will need to be postponed.  FYI'd CVTS on his current status.  For management of his RVR, will start amiodarone bolus and then infusion.  Placed him on heparin as well, his home Xarelto was being held due to upcoming procedure.  Additionally, consulted cardiology for additional assistance and further management. Believe his oxygen requirement is multifactorial with his substantial lung mass, ?obstructive infection, and may be in the setting of fluid overload, up 4L since admit with possible vascular congestion/opacities on CXR.  Will work towards diuresis after rate control as needed.   In summary: -Start amiodarone infusion -Heparin per pharmacy -Consult cardiology, appreciate recommendations -Informed CVTS of status, will need to postpone procedure  Patriciaann Clan, DO

## 2019-08-01 NOTE — Progress Notes (Signed)
Pt has a progressive/step down room on [place], attempted to call report, nurse in currently in another pt's room, he will call me back to get report.

## 2019-08-01 NOTE — Progress Notes (Signed)
      [address]       [place] [address]             [phone]      Events of overnight noted Went into atrial fib with RVR and had significantly increased O2 demand. Discussed with Anesthesia- not comfortable with GA in this setting, so will cancel bronch until medical condition improves No OR availability tomorrow, but may be able to do over the weekend. An alternative approach would be to do a CT guided needle biopsy. Will follow  Revonda Standard. Roxan Hockey, MD [facility] [phone]

## 2019-08-01 NOTE — Progress Notes (Signed)
ANTICOAGULATION CONSULT NOTE - Follow Up Consult  Pharmacy Consult for Heparin Indication: atrial fibrillation  Allergies  Allergen Reactions  . Prozac [Fluoxetine Hcl] Nausea Only    Made the patient very sick to his stomach  . Morphine And Related Swelling    Pt states he can tolerate oxycodone and hydromorphone  . Penicillins Rash    Has patient had a PCN reaction causing immediate rash, facial/tongue/throat swelling, SOB or lightheadedness with hypotension: Yes Has patient had a PCN reaction causing severe rash involving mucus membranes or skin necrosis: No Has patient had a PCN reaction that required hospitalization: No Has patient had a PCN reaction occurring within the last 10 years: No If all of the above answers are "NO", then may proceed with Cephalosporin use.     Patient Measurements: Height: 6\' 3"  (190.5 cm) Weight: 175 lb (79.4 kg) IBW/kg (Calculated) : 84.5 Heparin Dosing Weight:    Vital Signs: Temp: 97.7 F (36.5 C) (01/07 1018) Temp Source: Oral (01/07 1439) BP: 112/69 (01/07 1439) Pulse Rate: 141 (01/07 1500)  Labs: Recent Labs    07/30/19 1719 07/30/19 1924 07/31/19 0554 07/31/19 1620 08/01/19 0821 08/01/19 0912 08/01/19 1806  HGB 8.4*  --  7.5*  --  8.5* 8.4* 8.2*  HCT 26.8*  --  24.3*  --  26.8* 26.1* 25.6*  PLT 418*  --  382  --  388 403* 407*  APTT  --   --   --  49*  --   --  71*  LABPROT  --   --   --  23.0*  --   --   --   INR  --   --   --  2.1*  --   --   --   HEPARINUNFRC  --   --   --   --   --   --  0.55  CREATININE 1.20  --  1.12  --  0.85  --   --   TROPONINIHS 12 12  --   --   --   --   --     Estimated Creatinine Clearance: 94.7 mL/min (by C-G formula based on SCr of 0.85 mg/dL).   Assessment:  Anticoag: Heparin - Afib. Xarelto PTA (last dose 1/4). CHADsVASc 4 (age, HTN, DM, HF) Xarelto PTA transitioned to IV heparin (1/6) but stopped d/t drop in Hgb Hgb 8.4 > 7.5 > 8.2 - PM: aPTT 761 in goal. HL 0.55 in goal.  Goal  of Therapy:  aPTT 66-102 seconds  HL 0.3-0.7 Monitor platelets by anticoagulation protocol: Yes   Plan:  Con't IV heparin at 1100 units/hr Daily HL and CBC   Daleigh Pollinger S. Alford Highland, PharmD, BCPS Clinical Staff Pharmacist [email or web address] Alford Highland, Konstantin Lehnen Stillinger 08/01/2019,7:24 PM

## 2019-08-01 NOTE — Consult Note (Addendum)
Cardiology Consultation:   Patient ID: Timothy Martinez; [id number]; 1950-12-09   Admit date: 07/30/2019 Date of Consult: 08/01/2019  Primary Care Provider: [name], DO Primary Cardiologist: Mertie Moores, MD 05/30/2018 S. Kathlen Mody, [facility], 06/11/2019 Primary Electrophysiologist:  None   Patient Profile:    Timothy Martinez is a 69 y.o. male with a hx of Paroxysmal AFib,Chronic diastolic CHF, COPD, Hx of recurrent acute on chronic alcoholic pancreatitis (no ETOH in 2 yrs), Hypertension, Hyperlipidemia, Diabetes mellitus, S/p splenectomy, and GERD, who is being seen today for the evaluation of rapid Afib, CHF and preop eval for bronch at the request of Dr Andria Frames.  History of Present Illness:   [name] was in presumed SR when seen 11/09. Volume status ok w/ wt 161 lbs. He was being considered for pancreatic transplant, but needed extensive testing per the surgeon at [facility], not done yet. MV done for this on 12/21, no scar or ischemia, EF nl. RCRI at that time 6.6%, but he could do > 4 METS. Therefore, acceptable risk for the procedure.   He had an ECG on 06/28/2019 that was sinus rhythm.  He was also in sinus rhythm on 07/15/2019 when he had a stress test.  Pt admitted 01/05 with 1 month of increasing SOB and 2 days of dizziness.   CXR abnl w/ possible lung mass. CT performed and showed large LUL lung mass w/ chest wall invasion and rib destruction, probable primary pulm neoplasm. Dr Roxan Hockey saw, plans for bronch. Preop cardiology evaluation requested.  He was in atrial fib on admission, rate initially controlled.  However, he had problems with hypotension, systolic blood pressure occasionally in the 70s.  He had gotten Lopressor 75 mg x 1 dose, but it was discontinued. Yesterday, his heart rate increased and he has been tachycardic most of the time over the last 24 hours.   Today, his heart rate has been more elevated, greater than 150.  He got 3 injections of Lopressor 5 mg IV  overnight, and that did help his heart rate. Cardiology was asked to evaluate him for this.  There was concern that he was having a CHF exacerbation.  Cardiology was asked to evaluate him for this.  Of note, the chest x-ray today showed a partially loculated pleural effusion in the left lung base.  It also showed evolving multi lobar pneumonia.  No evidence to suggest frank pulmonary edema.  CT showed emphysematous changes with subpleural fibrosis in addition to the left upper lobe mass, but but did not mention any fluid.  [name] has been having increasing dyspnea on exertion for a month or more.  He denies orthopnea or PND.  He denies lower extremity edema.  He wheezes on a regular basis, but says that improves if he uses his inhalers.  His cough is generally nonproductive.  He does not necessarily feel his atrial fibrillation, but thinks it started 2 days ago, says that is when he began having problems with dizziness and weakness when he stood up.  He has not had any exertional chest pain.  His abdominal symptoms are greatly improved by taking Creon before he eats.  He also takes hydrocodone 7.5 mg a couple of times a day.  He was planning to get the pretransplant testing done next month, but now says he will put that off.  He is most interested in getting the lung mass evaluated.   Past Medical History:  Diagnosis Date  . Acute on chronic pancreatitis ([place]) 05/17/2016  .  Alcohol abuse   . Alcoholic pancreatitis   . Anginal pain ([place])   . Anxiety and depression 01/20/2015   PHQ 9 = 11 (01/20/15)    . Arthritis    "eat up w/it" (05/17/2016)  . Atrial fibrillation status post cardioversion ([place]) 08/2014  . Atrial flutter, unspecified   . CHF (congestive heart failure) ([place])   . Chronic anticoagulation   . Chronic atrial fibrillation ([place])   . Chronic bronchitis ([place])   . Chronic left-sided low back pain with sciatica 12/21/2012  . Chronic pancreatitis ([place])   . COLD (chronic  obstructive lung disease) ([place])   . Complication of anesthesia    pt C/O confusion after procedure in April 2020  . Daily headache    "need eye examined" (05/17/2016)  . Gastric outlet obstruction 10/2018  . GERD (gastroesophageal reflux disease)   . Hematuria, microscopic 02/13/2015   Noted on UA - Recheck UA at next visit ~ 1 months   . Hyperlipidemia   . Hypertension   . Insomnia 02/11/2016  . Lateral epicondylitis of right elbow 03/17/2017  . Left foot pain 10/12/2013  . Loss of weight 02/19/2018  . Low back pain 12/21/2012  . Malnutrition of moderate degree 05/18/2016  . Neck pain, bilateral posterior 10/15/2014  . Neuropathy 03/16/2015  . Pain of joint of left ankle and foot 06/13/2018  . Pain of right upper extremity 05/30/2016  . Pancreatic pseudocyst    seen on CT scan 07/2014  . Pancreatitis 08/04/2014  . Permanent atrial fibrillation ([place]) 05/05/2017  . Pneumonia    "?a few times" (05/17/2016)  . 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 Tobacco abuse 05/30/2011   Heavy smoker up to 3 ppd down to 15 cigs per day in 09/2014   . Type II diabetes mellitus ([place])   . Wears dentures   . Wears glasses     Past Surgical History:  Procedure Laterality Date  . BACK SURGERY    . BIOPSY  11/20/2018   Procedure: BIOPSY;  Surgeon: Rush Landmark Telford Nab., MD;  Location: [place];  Service: Gastroenterology;;  . BIOPSY  02/04/2019   Procedure: BIOPSY;  Surgeon: Irving Copas., MD;  Location: [place];  Service: Gastroenterology;;  . CARDIOVERSION N/A 09/22/2014   Procedure: CARDIOVERSION;  Surgeon: Thayer Headings, MD;  Location: [place];  Service: Cardiovascular;  Laterality: N/A;  . COLONOSCOPY W/ POLYPECTOMY  2019  . ERCP N/A 02/04/2019   Procedure: ENDOSCOPIC RETROGRADE CHOLANGIOPANCREATOGRAPHY (ERCP);  Surgeon: Irving Copas., MD;  Location: [place];  Service: Gastroenterology;  Laterality: N/A;  . ESOPHAGOGASTRODUODENOSCOPY (EGD) WITH PROPOFOL N/A 11/20/2018    Procedure: ESOPHAGOGASTRODUODENOSCOPY (EGD) WITH PROPOFOL;  Surgeon: Rush Landmark Telford Nab., MD;  Location: [place];  Service: Gastroenterology;  Laterality: N/A;  . ESOPHAGOGASTRODUODENOSCOPY (EGD) WITH PROPOFOL N/A 02/04/2019   Procedure: ESOPHAGOGASTRODUODENOSCOPY (EGD) WITH PROPOFOL;  Surgeon: Rush Landmark Telford Nab., MD;  Location: [place];  Service: Gastroenterology;  Laterality: N/A;  . FOREIGN BODY REMOVAL  11/20/2018   Procedure: FOREIGN BODY REMOVAL;  Surgeon: Rush Landmark Telford Nab., MD;  Location: [place];  Service: Gastroenterology;;  . [place]   not cervical, states had lesion on neck which was removed  . POSTERIOR LAMINECTOMY / DECOMPRESSION LUMBAR SPINE  10/2006   of L4, L5 and S1 with a 5-1 diskectomy, microdissection with the microscope/notes 10/24/2006  . SPLENECTOMY  2003  . UPPER ESOPHAGEAL ENDOSCOPIC ULTRASOUND (EUS) N/A 11/20/2018   Procedure: UPPER ESOPHAGEAL ENDOSCOPIC ULTRASOUND (EUS);  Surgeon: Irving Copas., MD;  Location: [facility];  Service: Gastroenterology;  Laterality: N/A;  . UPPER ESOPHAGEAL ENDOSCOPIC ULTRASOUND (EUS) N/A 02/04/2019   Procedure: UPPER ESOPHAGEAL ENDOSCOPIC ULTRASOUND (EUS);  Surgeon: Irving Copas., MD;  Location: [place];  Service: Gastroenterology;  Laterality: N/A;     Prior to Admission medications   Medication Sig Start Date End Date Taking? Authorizing Provider  amitriptyline (ELAVIL) 10 MG tablet TAKE 1 TABLET BY MOUTH EVERYDAY AT BEDTIME Patient taking differently: Take 10 mg by mouth at bedtime.  02/18/19  Yes Lockamy, Timothy, DO  atorvastatin (LIPITOR) 40 MG tablet Take 0.5 tablets (20 mg total) by mouth daily at 6 PM. Patient taking differently: Take 40 mg by mouth daily at 6 PM.  11/23/18  Yes Beard, Samantha N, DO  famotidine (PEPCID) 20 MG tablet TAKE 1 TABLET BY MOUTH TWICE A DAY Patient taking differently: Take 20 mg by mouth every morning.  04/05/19  Yes Lockamy, Christia Reading, DO    feeding supplement, ENSURE ENLIVE, (ENSURE ENLIVE) LIQD Take 237 mLs by mouth 3 (three) times daily between meals. 11/23/18  Yes Beard, Samantha N, DO  gabapentin (NEURONTIN) 100 MG capsule TAKE 1 CAPSULE BY MOUTH THREE TIMES A DAY Patient taking differently: Take 100 mg by mouth 2 (two) times daily.  03/05/19  Yes [name], DO  HYDROcodone-acetaminophen (NORCO) 7.5-325 MG tablet Take 1 tablet by mouth 3 (three) times daily.  05/27/19  Yes [provider]  lipase/protease/amylase (CREON) 36000 UNITS CPEP capsule Take 36,000-72,000 capsules by mouth See admin instructions. 2 caps with each meal, 1 capsule with snacks 01/28/19  Yes [provider]  metFORMIN (GLUCOPHAGE) 1000 MG tablet Take 1 tablet (1,000 mg total) by mouth 2 (two) times daily with a meal. 03/05/19  Yes Lockamy, Timothy, DO  metoprolol tartrate (LOPRESSOR) 50 MG tablet Take 1.5 tablets (75 mg total) by mouth 2 (two) times daily. 05/15/19  Yes Lockamy, Timothy, DO  mometasone-formoterol (DULERA) 200-5 MCG/ACT AERO Inhale 2 puffs into the lungs 2 (two) times daily. 09/27/18  Yes [name], DO  Multiple Vitamin (MULTIVITAMIN WITH MINERALS) TABS tablet Take 1 tablet by mouth daily. 11/24/18  Yes Darrelyn Hillock N, DO  naproxen sodium (ALEVE) 220 MG tablet Take 880 mg by mouth 2 (two) times daily as needed (pain).   Yes [provider]  nitroGLYCERIN (NITROSTAT) 0.4 MG SL tablet Place 1 tablet (0.4 mg total) under the tongue every 5 (five) minutes as needed for chest pain. 03/23/18  Yes Meccariello, Bernita Raisin, DO  ondansetron (ZOFRAN ODT) 4 MG disintegrating tablet Take 1 tablet (4 mg total) by mouth every 8 (eight) hours as needed for nausea or vomiting. 12/22/18  Yes Albrizze, Kaitlyn E, PA-C  polyethylene glycol (MIRALAX / GLYCOLAX) 17 g packet TAKE 17 G BY MOUTH DAILY. Patient taking differently: Take 17 g by mouth daily as needed for moderate constipation.  11/14/18  Yes Lockamy, Christia Reading, DO   prochlorperazine (COMPAZINE) 10 MG tablet Take 1 tablet (10 mg total) by mouth every 8 (eight) hours as needed for nausea or vomiting. 01/01/19  Yes Mansouraty, Telford Nab., MD  tamsulosin (FLOMAX) 0.4 MG CAPS capsule Take 1 capsule (0.4 mg total) by mouth daily. 06/12/19  Yes Lockamy, Timothy, DO  XARELTO 20 MG TABS tablet TAKE 1 TABLET BY MOUTH DAILY WITH SUPPER Patient taking differently: Take 20 mg by mouth daily with supper.  02/27/19  Yes Lockamy, Timothy, DO  Blood Glucose Monitoring Suppl (ONE TOUCH ULTRA 2) w/Device KIT Dispense one glucometer. Diagnosis Type 2 Diabetes.  E11.9 06/13/18   Rory Percy, DO  glucose blood (ONE TOUCH ULTRA TEST) test strip Use check fasting blood glucose qAM. Type 2 Diabetes E11.9 10/01/18   [name], DO  Lancets (FREESTYLE) lancets Use as instructed 10/02/18   [name], DO  Lancets (ONETOUCH ULTRASOFT) lancets Use as instructed 10/02/18   [name], DO  pantoprazole (PROTONIX) 40 MG tablet Take 1 tablet (40 mg total) by mouth daily. Patient not taking: Reported on 07/31/2019 10/01/18   [name], DO  famotidine (PEPCID) 20 MG tablet Take 1 tablet (20 mg total) by mouth 2 (two) times daily. 03/27/19   [name], DO    Inpatient Medications: Scheduled Meds: . acetaminophen  650 mg Oral Q6H  . atorvastatin  20 mg Oral q1800  . doxycycline  100 mg Oral Q12H  . famotidine  20 mg Oral BID  . feeding supplement (ENSURE ENLIVE)  237 mL Oral TID BM  . gabapentin  100 mg Oral TID  . lipase/protease/amylase  72,000 Units Oral TID WC  . magnesium oxide  400 mg Oral Daily  . mometasone-formoterol  2 puff Inhalation BID  . multivitamin with minerals  1 tablet Oral Daily  . pantoprazole  40 mg Oral Daily  . polyethylene glycol  17 g Oral Daily   Continuous Infusions: . heparin 1,100 Units/hr (08/01/19 0855)   PRN Meds: amitriptyline, levalbuterol, lipase/protease/amylase, nicotine, nitroGLYCERIN  Allergies:    Allergies   Allergen Reactions  . Prozac [Fluoxetine Hcl] Nausea Only    Made the patient very sick to his stomach  . Morphine And Related Swelling    Pt states he can tolerate oxycodone and hydromorphone  . Penicillins Rash    Has patient had a PCN reaction causing immediate rash, facial/tongue/throat swelling, SOB or lightheadedness with hypotension: Yes Has patient had a PCN reaction causing severe rash involving mucus membranes or skin necrosis: No Has patient had a PCN reaction that required hospitalization: No Has patient had a PCN reaction occurring within the last 10 years: No If all of the above answers are "NO", then may proceed with Cephalosporin use.     Social History:   Social History   Socioeconomic History  . Marital status: Single    Spouse name: Not on file  . Number of children: 2  . Years of education: Not on file  . Highest education level: Not on file  Occupational History  . Occupation: retired  Tobacco Use  . Smoking status: Current Every Day Smoker    Packs/day: 0.50    Years: 52.00    Pack years: 26.00    Types: Cigarettes    Start date: 04/09/1964  . Smokeless tobacco: Never Used  . Tobacco comment: down to one pack daily  Substance and Sexual Activity  . Alcohol use: Not Currently    Comment: QUIT IN 02/2018  . Drug use: No  . Sexual activity: Not Currently    Birth control/protection: Condom  Other Topics Concern  . 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     2 children one is Radio producer in transplant center   Single or divorced   Admits to alcohol abuse in past- no longer drinking- last drink 02/2018    Smoker   No drugs   Social Determinants of Health   Financial Resource Strain:   . Difficulty of Paying Living Expenses: Not on file  Food Insecurity:   . Worried About Charity fundraiser in the Last Year: Not on file  . Ran Out of Food in the Last Year: Not on  file  Transportation Needs:   . Lack of Transportation (Medical): Not on file  . Lack of Transportation (Non-Medical): Not on file  Physical Activity:   . Days of Exercise per Week: Not on file  . Minutes of Exercise per Session: Not on file  Stress:   . Feeling of Stress : Not on file  Social Connections:   . Frequency of Communication with Friends and Family: Not on file  . Frequency of Social Gatherings with Friends and Family: Not on file  . Attends Religious Services: Not on file  . Active Member of Clubs or Organizations: Not on file  . Attends Archivist Meetings: Not on file  . Marital Status: Not on file  Intimate Partner Violence:   . Fear of Current or Ex-Partner: Not on file  . Emotionally Abused: Not on file  . Physically Abused: Not on file  . Sexually Abused: Not on file    Family History:   Family History  Problem Relation Age of Onset  . Diabetes Mother   . Aneurysm Mother   . Stroke Mother   . Hypertension Mother   . Alcohol abuse Father   . Cancer Father        Lung  . Cancer Sister        Lung Cancer  . Post-traumatic stress disorder Brother   . Heart attack Neg Hx   . Colon cancer Neg Hx   . Liver cancer Neg Hx   . Rectal cancer Neg Hx   . Esophageal cancer Neg Hx   . Stomach cancer Neg Hx   . Inflammatory bowel disease Neg Hx    Family Status:  Family Status  Relation Name Status  . Mother  Deceased at age 90       Brain aneurysm  . Father  Deceased at age 48       Lung Cancer  . Sister  Deceased at age 65       Lung Cancer  . Brother  Alive, age 75y  . Sister  Alive  . Sister  Alive  . Sister  Alive  . Sister  Alive  . Sister  Alive  . MGM  Deceased  . MGF  Deceased  . PGM  Deceased  . PGF  Deceased  . Neg Hx  (Not Specified)    ROS:  Please see the history of present illness.  All other ROS reviewed and negative.     Physical Exam/Data:   Vitals:   08/01/19 0654 08/01/19 0700 08/01/19 0807 08/01/19 1018  BP:   (!) 114/99 107/65 134/75  Pulse:  (!) 156 (!) 163 (!) 135  Resp: (!) 22 (!) 21 (!) 22 (!) 26  Temp:  98.1 F (36.7 C) 98.9 F (37.2 C) 97.7 F (36.5 C)  TempSrc:  Oral Oral Oral  SpO2: 93% 93% 93% 95%  Weight:    79.4 kg  Height:        Intake/Output Summary (Last 24 hours) at 08/01/2019 1105 Last data filed at 08/01/2019 1055 Gross per 24 hour  Intake 2092 ml  Output 1750 ml  Net 342 ml   Filed Weights   07/30/19 1713 08/01/19 1018  Weight: 74.4 kg 79.4 kg   Body mass index is 21.87 kg/m.  General:  Well nourished, well  developed, male in no acute distress HEENT: normal Lymph: no adenopathy Neck: JVD -10 cm Endocrine:  No thryomegaly Vascular: No carotid bruits; 4/4 extremity pulses 2+  Cardiac:  normal S1, S2; rapid and irregular rate and rhythm; no murmur Lungs: Rales L>R bilaterally, minimal wheezing, rare rhonchi   Abd: soft, chronically tender, no hepatomegaly  Ext: no edema Musculoskeletal:  No deformities, BUE and BLE strength normal and equal Skin: warm and dry  Neuro:  CNs 2-12 intact, no focal abnormalities noted Psych:  Normal affect   EKG:  The EKG was personally reviewed and demonstrates: 07/30/2019 ECG is atrial fibrillation, heart rate 105, no acute ischemic changes Telemetry:  Telemetry was personally reviewed and demonstrates: Atrial fibrillation, heart rate consistently elevated over the last 24 hours.   CV studies:   ECHO: ordered 08/01/2019  ECHO: 12/30/2014 - Left ventricle: The cavity size was normal. Wall thickness was   increased in a pattern of moderate LVH. Systolic function was   normal. The estimated ejection fraction was in the range of 55%   to 60%. Wall motion was normal; there were no regional wall   motion abnormalities. Doppler parameters are consistent with   abnormal left ventricular relaxation (grade 1 diastolic   dysfunction). The E/e&' ratio is between 8-15, suggesting   indeterminate LV filling pressure. - Mitral valve:  Thickned leaflets with trivial MR. 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 - Left atrium: The atrium was normal in size.  Impressions:  - Compared to the prior study in 07/2014, the EF has normalized. The   remnant chordal loop structure is still present in the LV cavity.   CATH: n/a  MYOVIEW: 07/15/2019  Nuclear stress EF: 61%.  There was no ST segment deviation noted during stress.  The study is normal.  This is a low risk study.  The left ventricular ejection fraction is normal (55-65%).  No prior study for comparison.     Laboratory Data:   Chemistry Recent Labs  Lab 07/30/19 1719 07/31/19 0554 08/01/19 0821  NA 136 137 140  K 4.1 3.6 4.0  CL 103 111 111  CO2 21* 18* 19*  GLUCOSE 164* 149* 115*  BUN 25* 29* 17  CREATININE 1.20 1.12 0.85  CALCIUM 8.1* 7.5* 7.9*  GFRNONAA >60 >60 >60  GFRAA >60 >60 >60  ANIONGAP '12 8 10    ' Lab Results  Component Value Date   ALT 19 08/01/2019   AST 32 08/01/2019   ALKPHOS 229 (H) 08/01/2019   BILITOT 1.3 (H) 08/01/2019   Hematology Recent Labs  Lab 07/31/19 0841 08/01/19 0821 08/01/19 0912  WBC 24.6* 22.1* 22.1*  RBC 3.01* 3.08* 3.01*  HGB 8.3* 8.5* 8.4*  HCT 26.9* 26.8* 26.1*  MCV 89.4 87.0 86.7  MCH 27.6 27.6 27.9  MCHC 30.9 31.7 32.2  RDW 15.6* 15.8* 15.8*  PLT 396 388 403*   Cardiac Enzymes High Sensitivity Troponin:   Recent Labs  Lab 07/30/19 1719 07/30/19 1924  TROPONINIHS 12 12      BNP Recent Labs  Lab 07/30/19 1714  BNP 386.3*    DDimer No results for input(s): DDIMER in the last 168 hours. TSH:  Lab Results  Component Value Date   TSH 2.634 03/22/2018   Lipids: Lab Results  Component Value Date   CHOL 69 11/18/2018   HDL 26 (L) 11/18/2018   LDLCALC 27 11/18/2018   TRIG  78 11/18/2018   CHOLHDL 2.7 11/18/2018   HgbA1c: Lab Results  Component Value Date   HGBA1C 6.1 06/12/2019   Magnesium:   Magnesium  Date Value Ref Range Status  11/23/2018 1.4 (L) 1.7 - 2.4 mg/dL Final    Comment:    Performed at [facility], [place] [address], [place], [place] [address]     Radiology/Studies:  CT Angio Chest PE W and/or Wo Contrast  Result Date: 07/30/2019 CLINICAL DATA:  Shortness of breath and near syncopal episode, mass lesion on recent chest x-ray EXAM: CT ANGIOGRAPHY CHEST CT ABDOMEN AND PELVIS WITH CONTRAST TECHNIQUE: Multidetector CT imaging of the chest was performed using the standard protocol during bolus administration of intravenous contrast. Multiplanar CT image reconstructions and MIPs were obtained to evaluate the vascular anatomy. Multidetector CT imaging of the abdomen and pelvis was performed using the standard protocol during bolus administration of intravenous contrast. CONTRAST:  13m OMNIPAQUE IOHEXOL 350 MG/ML SOLN COMPARISON:  Prior chest x-ray from earlier in the same day CT from 03/30/2018. FINDINGS: CTA CHEST FINDINGS Cardiovascular: Thoracic aorta demonstrates atherosclerotic calcifications without aneurysmal dilatation. No cardiac enlargement is noted. Mild coronary calcifications are noted. The pulmonary artery is well visualized within normal enhancement pattern. No filling defect to suggest pulmonary embolism is seen. Mediastinum/Nodes: Thoracic inlet shows a hypodensity within the left lobe of the thyroid. This measures approximately 14 mm in greatest dimension. The esophagus as visualized is within normal limits. Scattered small mediastinal lymph nodes are noted primarily within the AP window. Small hilar adenopathy is noted bilaterally. Lungs/Pleura: In the left upper lobe there is a focal soft tissue mass lesion which measures approximately 11.8 x 8.2 cm in greatest AP and transverse dimensions respectively. It has some extension to the left hilum and there are associated changes involving the posterolateral aspect of the left third rib consistent with localized  invasion and metastatic disease. There also appears to be soft tissue density between the third and fourth ribs laterally consistent with chest wall invasion. Emphysematous changes are noted with evidence of subpleural fibrosis similar to that seen on prior chest x-rays. 7 mm nodule is noted in the right upper lobe best seen on image number 76 of series 4. This is stable from a prior CT examination. Musculoskeletal: As described above there are destructive changes of the posterolateral aspect of the left third rib consistent with localized neoplastic invasion. No other specific rib abnormality is noted. The thoracic spine demonstrates degenerative change without lytic lesion or compression deformity. Review of the MIP images confirms the above findings. CT ABDOMEN and PELVIS FINDINGS Hepatobiliary: Gallbladder is well distended. Mild biliary ductal dilatation is seen secondary to changes of chronic pancreatitis in the head of the pancreas. These changes are similar to that seen on prior abdominal CT. The liver demonstrates some fatty infiltration. No definitive mass is seen. Pancreas: Chronic changes are noted in the pancreas with pancreatic ductal dilatation stable from the prior exam. Multiple calcifications are seen in the head and uncinate process consistent with prior chronic pancreatitis. Previously seen pseudocyst in the head of the pancreas is not as well visualized on today's exam. Spleen: Surgically removed Adrenals/Urinary Tract: Adrenal glands are within normal limits. The kidneys demonstrate a normal enhancement pattern bilaterally. No definitive calculi are seen. Delayed images demonstrate dense nephrograms without significant contrast enhancement. This is of uncertain significance but may be related to incomplete delayed images. No elevated creatinine is seen. The bladder is partially distended. Stomach/Bowel: No obstructive or  inflammatory changes of the colon are seen. The appendix is not well  visualized. No obstructive changes of the small bowel are noted. Stomach is within normal limits. Vascular/Lymphatic: Aortic atherosclerosis. No enlarged abdominal or pelvic lymph nodes. Reproductive: Prostate is unremarkable. Other: No abdominal wall hernia or abnormality. No abdominopelvic ascites. Musculoskeletal: Degenerative changes of lumbar spine are noted. No metastatic lesions are seen. Review of the MIP images confirms the above findings. IMPRESSION: Large left upper lobe mass lesion with apparent chest wall invasion in rib destruction as described consistent with a primary pulmonary neoplasm till proven otherwise. Associated mediastinal and hilar adenopathy is seen. Stable nodule in the right upper lobe unchanged from 2019. No evidence of pulmonary emboli. Hypodense lesion within the left lobe of the thyroid. No followup recommended (ref: J Am Coll Radiol. 2015 Feb;12(2): 143-50). Chronic changes in the pancreas consistent with chronic pancreatitis. Pancreatic ductal dilatation in biliary ductal dilatation is seen. Chronic changes in the abdomen and pelvis stable from previous exams. Electronically Signed   By: Inez Catalina M.D.   On: 07/30/2019 20:17   CT ABDOMEN PELVIS W CONTRAST  Result Date: 07/30/2019 CLINICAL DATA:  Shortness of breath and near syncopal episode, mass lesion on recent chest x-ray EXAM: CT ANGIOGRAPHY CHEST CT ABDOMEN AND PELVIS WITH CONTRAST TECHNIQUE: Multidetector CT imaging of the chest was performed using the standard protocol during bolus administration of intravenous contrast. Multiplanar CT image reconstructions and MIPs were obtained to evaluate the vascular anatomy. Multidetector CT imaging of the abdomen and pelvis was performed using the standard protocol during bolus administration of intravenous contrast. CONTRAST:  116m OMNIPAQUE IOHEXOL 350 MG/ML SOLN COMPARISON:  Prior chest x-ray from earlier in the same day CT from 03/30/2018. FINDINGS: CTA CHEST FINDINGS  Cardiovascular: Thoracic aorta demonstrates atherosclerotic calcifications without aneurysmal dilatation. No cardiac enlargement is noted. Mild coronary calcifications are noted. The pulmonary artery is well visualized within normal enhancement pattern. No filling defect to suggest pulmonary embolism is seen. Mediastinum/Nodes: Thoracic inlet shows a hypodensity within the left lobe of the thyroid. This measures approximately 14 mm in greatest dimension. The esophagus as visualized is within normal limits. Scattered small mediastinal lymph nodes are noted primarily within the AP window. Small hilar adenopathy is noted bilaterally. Lungs/Pleura: In the left upper lobe there is a focal soft tissue mass lesion which measures approximately 11.8 x 8.2 cm in greatest AP and transverse dimensions respectively. It has some extension to the left hilum and there are associated changes involving the posterolateral aspect of the left third rib consistent with localized invasion and metastatic disease. There also appears to be soft tissue density between the third and fourth ribs laterally consistent with chest wall invasion. Emphysematous changes are noted with evidence of subpleural fibrosis similar to that seen on prior chest x-rays. 7 mm nodule is noted in the right upper lobe best seen on image number 76 of series 4. This is stable from a prior CT examination. Musculoskeletal: As described above there are destructive changes of the posterolateral aspect of the left third rib consistent with localized neoplastic invasion. No other specific rib abnormality is noted. The thoracic spine demonstrates degenerative change without lytic lesion or compression deformity. Review of the MIP images confirms the above findings. CT ABDOMEN and PELVIS FINDINGS Hepatobiliary: Gallbladder is well distended. Mild biliary ductal dilatation is seen secondary to changes of chronic pancreatitis in the head of the pancreas. These changes are  similar to that seen on prior abdominal CT. The liver demonstrates  some fatty infiltration. No definitive mass is seen. Pancreas: Chronic changes are noted in the pancreas with pancreatic ductal dilatation stable from the prior exam. Multiple calcifications are seen in the head and uncinate process consistent with prior chronic pancreatitis. Previously seen pseudocyst in the head of the pancreas is not as well visualized on today's exam. Spleen: Surgically removed Adrenals/Urinary Tract: Adrenal glands are within normal limits. The kidneys demonstrate a normal enhancement pattern bilaterally. No definitive calculi are seen. Delayed images demonstrate dense nephrograms without significant contrast enhancement. This is of uncertain significance but may be related to incomplete delayed images. No elevated creatinine is seen. The bladder is partially distended. Stomach/Bowel: No obstructive or inflammatory changes of the colon are seen. The appendix is not well visualized. No obstructive changes of the small bowel are noted. Stomach is within normal limits. Vascular/Lymphatic: Aortic atherosclerosis. No enlarged abdominal or pelvic lymph nodes. Reproductive: Prostate is unremarkable. Other: No abdominal wall hernia or abnormality. No abdominopelvic ascites. Musculoskeletal: Degenerative changes of lumbar spine are noted. No metastatic lesions are seen. Review of the MIP images confirms the above findings. IMPRESSION: Large left upper lobe mass lesion with apparent chest wall invasion in rib destruction as described consistent with a primary pulmonary neoplasm till proven otherwise. Associated mediastinal and hilar adenopathy is seen. Stable nodule in the right upper lobe unchanged from 2019. No evidence of pulmonary emboli. Hypodense lesion within the left lobe of the thyroid. No followup recommended (ref: J Am Coll Radiol. 2015 Feb;12(2): 143-50). Chronic changes in the pancreas consistent with chronic pancreatitis.  Pancreatic ductal dilatation in biliary ductal dilatation is seen. Chronic changes in the abdomen and pelvis stable from previous exams. Electronically Signed   By: Inez Catalina M.D.   On: 07/30/2019 20:17   DG CHEST PORT 1 VIEW  Result Date: 08/01/2019 CLINICAL DATA:  69 year old male with hypoxia. Oxygen desaturation. Shortness of breath. EXAM: PORTABLE CHEST 1 VIEW COMPARISON:  Chest x-ray 07/30/2019.  Chest CTA 07/30/2019. FINDINGS: Previously noted left upper lobe mass appears larger on today's examination, which could represent growth of the lesion, or may reflect surrounding postobstructive changes. This lesion is again invasive into the left chest wall with gross destruction of the posterolateral aspect of the left third rib. Enlarging small left pleural effusion which appears partially loculated in the periphery of the left base. Increasing areas of ill-defined opacity and interstitial prominence throughout the lungs bilaterally (left greater than right), likely reflects evolving multilobar pneumonia. Pulmonary vasculature is partially obscured, but there is no evidence to suggest frank pulmonary edema. Heart size is normal. Aortic atherosclerosis. IMPRESSION: 1. Interval worsening of aeration throughout the lungs bilaterally, concerning for developing multilobar pneumonia. 2. Enlargement of mass in left upper lobe which may reflect progression of the underlying neoplasm but also likely represents worsening adjacent postobstructive changes. Associated with this there is an enlarging small left partially loculated pleural effusion. Electronically Signed   By: Vinnie Langton M.D.   On: 08/01/2019 08:12   DG Chest Portable 1 View  Result Date: 07/30/2019 CLINICAL DATA:  Hypotension. EXAM: PORTABLE CHEST 1 VIEW COMPARISON:  December 29, 2018 FINDINGS: Mild, chronic appearing increased interstitial lung markings are noted. A 10.7 cm x 8.7 cm masslike opacity is seen within the left upper lobe. This is  increased in severity when compared to the prior study. There is apparent cortical destruction of the adjacent portion of the third left rib. This represents a new finding compared to the prior exam. There is no evidence  of a pleural effusion or pneumothorax. The heart size and mediastinal contours are within normal limits. IMPRESSION: 1. Large opacity within the left upper lobe suspicious for the presence of a large lung mass. The presence of adjacent cortical destruction of the third left rib is suggestive of malignant properties. Correlation with chest CT is recommended. Electronically Signed   By: Virgina Norfolk M.D.   On: 07/30/2019 17:57    Assessment and Plan:   1.  Preop cardiovascular evaluation. -Stress test last month was without scar or ischemia and his EF was normal -He can still do 3-4 METS -RCRI 6.6%, no way to reduce this. -He is at increased but acceptable risk for the procedure, follow-up on echo but no additional cardiac testing indicated unless echo is significantly abnormal  2.  PAF, RVR -Duration of A. fib is unclear, patient believes that it started 2 days ago, on the day of admission. -He was on Lopressor 75 mg twice daily prior to admission. -He got a dose 01/05 PM, but it was discontinued because his blood pressure dropped. -Since then, his heart rate is increased and now is poorly controlled. -He got a one-time dose of amiodarone 150 mg -He did get some benefit with IV Lopressor, his blood pressure did tolerate that -Discuss with MD restarting the metoprolol at a reduced dose with as needed meds as well. -CHA2DS2-VASc is 4 (age x 1, DM, HTN, CHF)  -He was on Xarelto prior to admission, this was held on admission.  Currently on heparin  3.  Chronic diastolic CHF -Although he has JVD, his chest x-ray and chest CT did not show any significant amount of fluid. -His BNP is mildly elevated -In 07/2014, his EF was 40-45%, but it had normalized by 12/2014 -His EF was  normal on recent Myoview -Echocardiogram has been ordered, follow-up on results  4.  Left upper lobe lung mass -Dr. Roxan Hockey has seen, initial plan was for bronchoscopy -No OR availability on 1/08, but may be able to do over the weekend. -Another approach would be CT-guided needle biopsy -Per IM/Dr. Roxan Hockey  Otherwise, per IM Active Problems:   Mass of upper lobe of left lung   Lung mass   For questions or updates, please contact [facility] Please consult [email or web address] for contact info under Cardiology/STEMI.   Signed, Rosaria Ferries, PA-C  08/01/2019 11:05 AM  As above, patient seen and examined.  Briefly he is a 69 year old male with past medical history of paroxysmal atrial fibrillation, chronic diastolic congestive heart failure, COPD, pancreatitis, hypertension, hyperlipidemia, diabetes mellitus, prior splenectomy now with lung mass for evaluation of atrial fibrillation.  Patient had a nuclear study December 21 that showed no scar or ischemia; ejection fraction 61%.  Patient being considered for pancreatic [facility] at [facility].  Patient states over the past 48 hours he has had worsening dyspnea on exertion, elevated heart rate and dizziness.  He was found to have atrial fibrillation and was sent to the hospital.  Chest CT shows large left upper lobe lung mass with chest wall invasion and rib destruction and bronchoscopy is planned.  Cardiology now asked to evaluate preoperatively and also for atrial fibrillation.  Patient has not had chest pain or syncope.  He denies fevers, chills, productive cough hemoptysis. Hemoglobin 8.4.  White blood cell count 22.1.  Electrocardiogram shows atrial fibrillation with rapid ventricular response.  BNP 386.  1 atrial fibrillation with rapid ventricular response-duration is unclear but may be 48 to 72 hours.  His metoprolol was  held because of hypotension and his heart rate is now elevated again.  Will resume metoprolol 50 mg 3 times daily.  His  blood pressure is borderline and I will also add IV amiodarone.  Continue IV heparin. CHADSvasc 4.  Schedule echocardiogram to assess LV function.  2 preoperative evaluation prior to bronchoscopy-patient has not had chest pain and recent nuclear study negative.  Once heart rate controlled patient may proceed without further cardiac evaluation.  3 question chronic diastolic congestive heart failure-patient does not appear to be volume overloaded on examination.  We will not diurese at this point.  4 lung mass-likely primary cancer.  For bronchoscopy once cardiac status stable.  5 history of chronic pancreatitis.  Kirk Ruths

## 2019-08-01 NOTE — Progress Notes (Signed)
FPTS Interim Progress Note  S: Paged by RN at 01:45, pt's HR 150-160s and BP 112/85. Recommended EKG which showed Afib with RVR, Prescribed IV metoprolol 5mg . Pt initially was reporting palpitations which improved post first dose of metoprolol. Reviewed pt who reported feeling anxious about procedure later today but denied chest pain, dizziness and he said his palpitations has improved  O: BP 112/79 (BP Location: Left Arm)   Pulse (!) 129   Temp (!) 97.3 F (36.3 C) (Oral)   Resp 15   Ht 6\' 3"  (1.905 m)   Wt 74.4 kg   SpO2 92%   BMI 20.50 kg/m    General: tired appearing male, pleasant, lying in bed watching tv. Cardio: difficult to hear HS due to airway sounds, irregular pulse. Normal cap refill  Pulm: Clear to auscultation anteriorly, no respiratory distress Neuro: Cranial nerves grossly intact  A/P: Afib with RVR likely secondary to metoprolol being held over last 24 hrs. Have prescribed further 5mg  IV metoprolol with 500cl N.saline bolus. RN made aware to chart HR and BP every 10 mins for the next 30 mins.  Lattie Haw, MD 08/01/2019, 3:00 AM PGY-1, [facility] pager [phone]

## 2019-08-01 NOTE — Progress Notes (Signed)
Dr. Posey Pronto paged pt HR 156-160  bp 112/85. No chest pain but patient did say he can feel his heart pounding

## 2019-08-01 NOTE — Progress Notes (Signed)
Pharmacy Antibiotic Note  Timothy Martinez is a 69 y.o. male admitted on 07/30/2019 with pneumonia (HAP). Pharmacy has been consulted for vancomycin and cefepime dosing. Patient is currently on 12L of oxygen and CXR is concerning for multilobar PNA. Patient is afebrile, Scr 0.85, WBC 22.1.   Plan: Initiate Vancomycin 1500 mg IV x1, then 1000 mg IV Q12H (estAUC 478, Scr used 0.85, goalAUC 400-550) Initiate Cefepime 2 g IV q8h Monitor renal function and clinical improvement  F/u MRSA PCR  Height: 6\' 3"  (190.5 cm) Weight: 164 lb (74.4 kg) IBW/kg (Calculated) : 84.5  Temp (24hrs), Avg:97.9 F (36.6 C), Min:97.3 F (36.3 C), Max:98.9 F (37.2 C)  Recent Labs  Lab 07/30/19 1719 07/31/19 0554 07/31/19 0841 08/01/19 0821  WBC 22.2* 21.3* 24.6* 22.1*  CREATININE 1.20 1.12  --  0.85    Estimated Creatinine Clearance: 88.7 mL/min (by C-G formula based on SCr of 0.85 mg/dL).    Allergies  Allergen Reactions  . Prozac [Fluoxetine Hcl] Nausea Only    Made the patient very sick to his stomach  . Morphine And Related Swelling    Pt states he can tolerate oxycodone and hydromorphone  . Penicillins Rash    Has patient had a PCN reaction causing immediate rash, facial/tongue/throat swelling, SOB or lightheadedness with hypotension: Yes Has patient had a PCN reaction causing severe rash involving mucus membranes or skin necrosis: No Has patient had a PCN reaction that required hospitalization: No Has patient had a PCN reaction occurring within the last 10 years: No If all of the above answers are "NO", then may proceed with Cephalosporin use.     Antimicrobials this admission: Vancomycin 1/7>> Cefepime 1/7 >>  Microbiology results: 1/7 Smear: pending   1/7 MRSA PCR: pending  Thank you for allowing pharmacy to be a part of this patient's care.  Lorel Monaco, PharmD PGY1 Ambulatory Care Resident [employer] # [phone]

## 2019-08-01 NOTE — Progress Notes (Signed)
The proposed treatment discussed in cancer conference 08/01/19 is for discussion purpose only and is not a binding recommendation.  The patient was not physically examined nor present for their treatment options.  Therefore, final treatment plans cannot be decided.

## 2019-08-01 NOTE — Progress Notes (Signed)
FPTS Interim Progress Note  S: To assess patient after it was noted in patient's chart that he would satting at 83% on 10 L HFNC.  Also, starting at 2000 on 1/6, patient needed 4 L O2 which has progressed to 10 L O2 HFNC.  Patient reported that he feels like his breathing is comfortable.  O: BP 104/71 (BP Location: Left Arm)   Pulse 92   Temp (!) 97.3 F (36.3 C) (Oral)   Resp (!) 21   Ht 6\' 3"  (1.905 m)   Wt 74.4 kg   SpO2 90%   BMI 20.50 kg/m   General: Thin, elderly appearing male sleeping in bed, easily awakened Cardiac: Mildly tachycardic with regular rhythm Respiratory: Increased respiratory rate and work of breathing on 10 L HFNC, no wheezing, rales or crackles on auscultation Neuro: Alert and oriented  A/P: Although patient's telemetry shows that he has converted back into sinus rhythm with a HR of around 100, it is concerning that patient's respiratory status has declined since 2000 on 1/6.  Patient has a history of COPD and has a large lung mass, and he may desaturate when he sleeps at baseline, but his oxygen demand is quite high.  It is possible that this is due to pulmonary edema due to the administration of several liters of fluid over the last 24 hours, although he does not have a history of CHF.  This could also be a new infection.  PE is also on the differential since patient is hypercoagulable given his likely malignancy and is not on anticoagulation due to his anemia, although less likely since CTA was negative 24 hours ago and patient has only been off of Xarelto for about 1 day.  We will get a CXR and transfer patient to the progressive floor.  Patient may benefit from administration of DuoNeb given his COPD, although this may increase the risk for conversion back to A. fib with RVR, so will hold pending CXR results.  We will continue to monitor closely.  Kathrene Alu, MD 08/01/2019, 5:05 AM PGY-3, [facility] pager [phone]

## 2019-08-02 ENCOUNTER — Inpatient Hospital Stay (HOSPITAL_COMMUNITY): Payer: Medicare Other

## 2019-08-02 DIAGNOSIS — R1032 Left lower quadrant pain: Secondary | ICD-10-CM

## 2019-08-02 DIAGNOSIS — I4891 Unspecified atrial fibrillation: Secondary | ICD-10-CM

## 2019-08-02 LAB — CBC WITH DIFFERENTIAL/PLATELET
Abs Immature Granulocytes: 0 10*3/uL (ref 0.00–0.07)
Abs Immature Granulocytes: 0.25 10*3/uL — ABNORMAL HIGH (ref 0.00–0.07)
Basophils Absolute: 0 10*3/uL (ref 0.0–0.1)
Basophils Absolute: 0.1 10*3/uL (ref 0.0–0.1)
Basophils Relative: 0 %
Basophils Relative: 1 %
Eosinophils Absolute: 0 10*3/uL (ref 0.0–0.5)
Eosinophils Absolute: 0.1 10*3/uL (ref 0.0–0.5)
Eosinophils Relative: 0 %
Eosinophils Relative: 1 %
HCT: 25.9 % — ABNORMAL LOW (ref 39.0–52.0)
HCT: 24.8 % — ABNORMAL LOW (ref 39.0–52.0)
Hemoglobin: 8.6 g/dL — ABNORMAL LOW (ref 13.0–17.0)
Hemoglobin: 8.2 g/dL — ABNORMAL LOW (ref 13.0–17.0)
Immature Granulocytes: 2 %
Lymphocytes Relative: 4 %
Lymphocytes Relative: 10 %
Lymphs Abs: 0.7 10*3/uL (ref 0.7–4.0)
Lymphs Abs: 1.7 10*3/uL (ref 0.7–4.0)
MCH: 27.7 pg (ref 26.0–34.0)
MCH: 27.6 pg (ref 26.0–34.0)
MCHC: 33.2 g/dL (ref 30.0–36.0)
MCHC: 33.1 g/dL (ref 30.0–36.0)
MCV: 83.5 fL (ref 80.0–100.0)
MCV: 83.5 fL (ref 80.0–100.0)
Monocytes Absolute: 0.9 10*3/uL (ref 0.1–1.0)
Monocytes Absolute: 1.6 10*3/uL — ABNORMAL HIGH (ref 0.1–1.0)
Monocytes Relative: 5 %
Monocytes Relative: 10 %
Neutro Abs: 15.8 10*3/uL — ABNORMAL HIGH (ref 1.7–7.7)
Neutro Abs: 13 10*3/uL — ABNORMAL HIGH (ref 1.7–7.7)
Neutrophils Relative %: 91 %
Neutrophils Relative %: 76 %
Platelets: 409 10*3/uL — ABNORMAL HIGH (ref 150–400)
Platelets: 382 10*3/uL (ref 150–400)
RBC: 3.1 MIL/uL — ABNORMAL LOW (ref 4.22–5.81)
RBC: 2.97 MIL/uL — ABNORMAL LOW (ref 4.22–5.81)
RDW: 15.5 % (ref 11.5–15.5)
RDW: 15.5 % (ref 11.5–15.5)
WBC: 17.4 10*3/uL — ABNORMAL HIGH (ref 4.0–10.5)
WBC: 16.8 10*3/uL — ABNORMAL HIGH (ref 4.0–10.5)
nRBC: 1.1 % — ABNORMAL HIGH (ref 0.0–0.2)
nRBC: 2 /100 WBC — ABNORMAL HIGH
nRBC: 1.1 % — ABNORMAL HIGH (ref 0.0–0.2)

## 2019-08-02 LAB — HEPATITIS PANEL, ACUTE
HCV Ab: NONREACTIVE
Hep A IgM: NONREACTIVE
Hep B C IgM: NONREACTIVE
Hepatitis B Surface Ag: NONREACTIVE

## 2019-08-02 LAB — COMPREHENSIVE METABOLIC PANEL
ALT: 18 U/L (ref 0–44)
AST: 28 U/L (ref 15–41)
Albumin: 1.6 g/dL — ABNORMAL LOW (ref 3.5–5.0)
Alkaline Phosphatase: 314 U/L — ABNORMAL HIGH (ref 38–126)
Anion gap: 10 (ref 5–15)
BUN: 15 mg/dL (ref 8–23)
CO2: 19 mmol/L — ABNORMAL LOW (ref 22–32)
Calcium: 8 mg/dL — ABNORMAL LOW (ref 8.9–10.3)
Chloride: 107 mmol/L (ref 98–111)
Creatinine, Ser: 0.87 mg/dL (ref 0.61–1.24)
GFR calc Af Amer: >60 mL/min (ref >60)
GFR calc non Af Amer: >60 mL/min (ref >60)
Glucose, Bld: 103 mg/dL — ABNORMAL HIGH (ref 70–99)
Potassium: 3.6 mmol/L (ref 3.5–5.1)
Sodium: 136 mmol/L (ref 135–145)
Total Bilirubin: 1.2 mg/dL (ref 0.3–1.2)
Total Protein: 5.2 g/dL — ABNORMAL LOW (ref 6.5–8.1)

## 2019-08-02 LAB — ECHOCARDIOGRAM COMPLETE
Height: 75 in
Weight: 2800 oz

## 2019-08-02 LAB — HEPARIN LEVEL (UNFRACTIONATED)
Heparin Unfractionated: 0.27 IU/mL — ABNORMAL LOW (ref 0.30–0.70)
Heparin Unfractionated: 0.17 IU/mL — ABNORMAL LOW (ref 0.30–0.70)

## 2019-08-02 LAB — HEPATITIS B SURFACE ANTIBODY, QUANTITATIVE: Hep B S AB Quant (Post): <3.1 m[IU]/mL — ABNORMAL LOW (ref >9.9)

## 2019-08-02 MED ORDER — AMIODARONE HCL IN DEXTROSE 360-4.14 MG/200ML-% IV SOLN: 60.0000 mg/h | INTRAVENOUS | Status: DC

## 2019-08-02 MED ORDER — AMIODARONE LOAD VIA INFUSION: 150.0000 mg | Freq: Once | INTRAVENOUS | Status: DC

## 2019-08-02 MED ORDER — AMIODARONE HCL IN DEXTROSE 360-4.14 MG/200ML-% IV SOLN
60.0000 mg/h | INTRAVENOUS | Status: AC
  Administered 2019-08-02 (×2): 60 mg/h via INTRAVENOUS
  Filled 2019-08-02: qty 200

## 2019-08-02 MED ORDER — AMIODARONE HCL IN DEXTROSE 360-4.14 MG/200ML-% IV SOLN: 30.0000 mg/h | INTRAVENOUS | Status: DC

## 2019-08-02 MED ORDER — METOPROLOL TARTRATE 50 MG PO TABS: 75.0000 mg | ORAL_TABLET | Freq: Two times a day (BID) | ORAL | Status: DC

## 2019-08-02 MED ORDER — METOPROLOL TARTRATE 50 MG PO TABS
75.0000 mg | ORAL_TABLET | Freq: Two times a day (BID) | ORAL | Status: DC
  Administered 2019-08-02 – 2019-08-09 (×14): 75 mg via ORAL
  Filled 2019-08-02 (×14): qty 1

## 2019-08-02 MED ORDER — AMIODARONE HCL IN DEXTROSE 360-4.14 MG/200ML-% IV SOLN
30.0000 mg/h | INTRAVENOUS | Status: DC
  Administered 2019-08-02: 30 mg/h via INTRAVENOUS
  Filled 2019-08-02 (×3): qty 200

## 2019-08-02 MED ORDER — AMIODARONE HCL IN DEXTROSE 360-4.14 MG/200ML-% IV SOLN
60.0000 mg/h | INTRAVENOUS | Status: DC
  Administered 2019-08-02: 60 mg/h via INTRAVENOUS

## 2019-08-02 NOTE — Progress Notes (Signed)
  Echocardiogram 2D Echocardiogram has been performed.  Timothy Martinez 08/02/2019, 1:52 PM

## 2019-08-02 NOTE — Progress Notes (Signed)
Family Medicine Teaching Service Daily Progress Note Intern Pager: [phone]  Patient name: Timothy Martinez Medical record number: [id number] Date of birth: [birth year] Age: 69 y.o. Gender: male  Primary Care Provider: [name], DO Consultants: CVTS   Code Status: Full   Pt Overview and Major Events to Date:  07/30/19: Admitted, CT Lung Mass with rib infiltration   Assessment and Plan: Timothy Martinez is a 69 y.o. male who presented dizziness and dyspnea. PMH is significant for chronic alcoholic pancreatitis, tobacco abuse, GERD, COPD, type II diabetes, AFIB, HFpEF, lumbar back pain, hyperlipidemia    A. Fibrillation  RVR  Patient made n.p.o. overnight as he was in 2:1 A.Flutter w/ HR ~140s. On exam, patient in sinus rhythm this morning with HR 82.  Unsure of cardiology was planning cardioversion made patient NPO overnight.  Amiodarone to start around 9 AM.  Per cardiology, continue home Toprol 75 mg twice daily.  Patient on heparin.  Follow-up on echo.  CHADsVASc score 4. Started on Heparin. Home meds: Metoprolol 75 mg BID, Xarelto 20 mg once daily -Continue cardiac monitoring, pulse oximetry -Restart home metoprolol 75 mg twice daily -Hold home Xarelto, continue heparin -Vitals per unit routine -Follow-up on echo   Dyspnea  Lung Mass concerning for metastasis  Anemia  Satting well on 10 L nasal cannula.  Denies shortness of breath on exam.  CVTS delayed patient's bronchoscopy due to his AFIB with RVR.  Considering CT-guided needle biopsy. - Consulted CVTS, appreciate recommendations - AM CBC with diff - Delayed video bronchoscopy w/ endobronchial [place]  - Vitals per unit routine  - Continuous pulse oximetry and telemetry  - PT / OT eval and treat  - MRSA PCR negative  - Type and Screen ordered q72hr   AKI., resolved  Cr 0.87  today, 0.76 on 12/4.  S/p 3x 1L bolus.  Discontinued IVF.  -Avoid nephrotoxic agents -CMP AM  Chronic pancreatitis 2/2 previous ETOH  excess Continues to have abdominal pain.  GGT elevated at 93.  ALP 320. Consider RUQ ultrasound.  Patient follow with [facility]. CT abdomen 07/30/19: Stable pancreatic ductal dilatation and multiple calcifications. Previously seen pseudocyst in head of the pancreas poorly visualized. Home meds: Ensure supplements TID between meals, Norco/Vicodin 5-325 mg every 6 hourly, Creon 2 capsules 5 times daily, Zofran 4 mg every 8 hourly, Compazine 10 mg every 8 hourly -Continue home meds -Consider RUQ [place]  -Hep B: non-immune   Diabetes Mellitus Glucose 103 today, last A1c 6.1 10/31/2017 Home meds: Metformin 1 g BID -Hold Metformin, no need for sliding scale at present -Monitor CBGs daily with BMP  Left ear ulcer See media for image.  Concern for skin cancer given chronic nature of this ulceration and location on a frequently sun exposed area. Patient reports wound has been present for 2-3 months.  -Xerform dressing, per Wound RN consultation  -Dermatology outpatient referral    Hyperlipidemia Home meds: Atorvastatin 20 mg once daily -Lipid panel  -Continue statin  HFpEF EF 55-60%with G1 DD on echo in 12/2014. Consider Lasix as rales heard on exam.  Follows with heart care, last seen in 2019 without concern.  - Cardiology consulted, to see today  -Monitor fluid status  -Follow-up on ECHO  COPD Continue to monitor respiratory status. Home meds: Dulera BID  -Continue Dulera -Albuterol PRN   GERD Home meds: Pantoprazole 40mg  once daily -Continue Protinix  - Pepcid 20 mg BID   Tobacco use Smokes 1 ppd since age 89, patient has  cut down from 3 ppd -14 mg nicotine patch as needed   Chronic lower back pain, chronic, stable Currently asymptomatic Home meds: hydrocodone, will continue - Scheduled Tylenol 650 mg q6h  - Norco 5mg  BID   Previous alcohol use: Stable Last drink was 1.5 years ago.  Encouraged patient to keep this up.  Hx of colonic polyps Three sessile polpys resected  during colonoscopy in 2019. No evidence of malignancy   Constipation Home meds: MiraLAX, Magnesium oxide -Continue home meds  FEN/GI: Regular diet, Ensure TID, Multivitamin PPx: SCDs  Disposition: pending medical clearance and PT/OT eval    Subjective:  [name] denies chest pain, shortness of breath.  Reports right-sided neck pain though he believes it may be related to the way he is sleeping.   Objective: Temp:  [97.4 F (36.3 C)-97.8 F (36.6 C)] 97.8 F (36.6 C) (01/08 0741) Pulse Rate:  [83-144] 88 (01/08 0805) Resp:  [20-32] 25 (01/08 0805) BP: (91-134)/(49-75) 116/63 (01/08 0805) SpO2:  [92 %-98 %] 94 % (01/08 0805) Weight:  [79.4 kg] 79.4 kg (01/07 1018)   Physical Exam:  GEN: Resting comfortably in bed, watching television CV: Regular rate and rhythm, no murmurs appreciated RESP: no increased work of breathing, clear to auscultation bilaterally ABD: Bowel sounds present. soft, nontender, nondistended MSK: no lower extremity edema, or cyanosis noted SKIN: warm, dry NEURO: grossly normal, moves all extremities appropriately     Laboratory: Recent Labs  Lab 08/01/19 0821 08/01/19 0912 08/01/19 1806  WBC 22.1* 22.1* 19.1*  HGB 8.5* 8.4* 8.2*  HCT 26.8* 26.1* 25.6*  PLT 388 403* 407*   Recent Labs  Lab 07/31/19 0554 08/01/19 0821 08/02/19 0345  NA 137 140 136  K 3.6 4.0 3.6  CL 111 111 107  CO2 18* 19* 19*  BUN 29* 17 15  CREATININE 1.12 0.85 0.87  CALCIUM 7.5* 7.9* 8.0*  PROT 5.1* 5.1* 5.2*  BILITOT 0.5 1.3* 1.2  ALKPHOS 180* 229* 314*  ALT 15 19 18   AST 27 32 28  GLUCOSE 149* 115* 103*      Imaging/Diagnostic Tests: No results found.     [name], DO PGY-1, [facility] 08/02/2019 9:21 AM    FPTS Intern pager: [phone], text pages welcome

## 2019-08-02 NOTE — Progress Notes (Signed)
      [address]       [place] [address]             [phone]      Asleep this evening. No complaints BP 117/68 (BP Location: Right Arm)   Pulse 73   Temp 97.9 F (36.6 C) (Oral)   Resp (!) 26   Ht 6\' 3"  (1.905 m)   Wt 79.4 kg   SpO2 95%   BMI 21.87 kg/m  In SR in 70s  Will reschedule for bronch on Monday. If opportunity arises might be able to get done this weekend  [place]. Roxan Hockey, MD [facility] [phone]

## 2019-08-02 NOTE — Progress Notes (Signed)
Progress Note  Patient Name: Timothy Martinez Date of Encounter: 08/02/2019  Primary Cardiologist: Mertie Moores, MD   Subjective   No CP or dyspnea  Inpatient Medications    Scheduled Meds: . acetaminophen  650 mg Oral Q6H  . atorvastatin  20 mg Oral q1800  . doxycycline  100 mg Oral Q12H  . famotidine  20 mg Oral BID  . feeding supplement (ENSURE ENLIVE)  237 mL Oral TID BM  . gabapentin  100 mg Oral TID  . lipase/protease/amylase  72,000 Units Oral TID WC  . magnesium oxide  400 mg Oral Daily  . metoprolol tartrate  50 mg Oral Q8H  . mometasone-formoterol  2 puff Inhalation BID  . multivitamin with minerals  1 tablet Oral Daily  . pantoprazole  40 mg Oral Daily  . polyethylene glycol  17 g Oral Daily   Continuous Infusions: . amiodarone    . heparin 1,100 Units/hr (08/02/19 0230)   PRN Meds: amitriptyline, levalbuterol, lipase/protease/amylase, metoprolol tartrate, nicotine, nitroGLYCERIN   Vital Signs    Vitals:   08/02/19 0520 08/02/19 0740 08/02/19 0741 08/02/19 0750  BP:  (!) 91/49 (!) 97/49 111/63  Pulse: 88 83 83 86  Resp: (!) 22 (!) 25 (!) 25 (!) 25  Temp:   97.8 F (36.6 C)   TempSrc:   Oral   SpO2: 98% 96%  95%  Weight:      Height:        Intake/Output Summary (Last 24 hours) at 08/02/2019 0830 Last data filed at 08/02/2019 0510 Gross per 24 hour  Intake 587 ml  Output 2030 ml  Net -1443 ml   Last 3 Weights 08/01/2019 07/30/2019 07/15/2019  Weight (lbs) 175 lb 164 lb 159 lb  Weight (kg) 79.379 kg 74.39 kg 72.122 kg      Telemetry    Atrial flutter converting to sinus- Personally Reviewed   Physical Exam   GEN: No acute distress.   Neck: No JVD Cardiac: RRR Respiratory: Diminished BS throughout GI: Soft, nontender, non-distended  MS: No edema Neuro:  Nonfocal  Psych: Normal affect   Labs    High Sensitivity Troponin:   Recent Labs  Lab 07/30/19 1719 07/30/19 1924  TROPONINIHS 12 12      Chemistry Recent Labs  Lab  07/31/19 0554 08/01/19 0821 08/02/19 0345  NA 137 140 136  K 3.6 4.0 3.6  CL 111 111 107  CO2 18* 19* 19*  GLUCOSE 149* 115* 103*  BUN 29* 17 15  CREATININE 1.12 0.85 0.87  CALCIUM 7.5* 7.9* 8.0*  PROT 5.1* 5.1* 5.2*  ALBUMIN 1.8* 1.7* 1.6*  AST 27 32 28  ALT 15 19 18   ALKPHOS 180* 229* 314*  BILITOT 0.5 1.3* 1.2  GFRNONAA >60 >60 >60  GFRAA >60 >60 >60  ANIONGAP 8 10 10      Hematology Recent Labs  Lab 08/01/19 0821 08/01/19 0912 08/01/19 1806  WBC 22.1* 22.1* 19.1*  RBC 3.08* 3.01* 2.98*  HGB 8.5* 8.4* 8.2*  HCT 26.8* 26.1* 25.6*  MCV 87.0 86.7 85.9  MCH 27.6 27.9 27.5  MCHC 31.7 32.2 32.0  RDW 15.8* 15.8* 15.6*  PLT 388 403* 407*    BNP Recent Labs  Lab 07/30/19 1714  BNP 386.3*     Radiology    DG CHEST PORT 1 VIEW  Result Date: 08/01/2019 CLINICAL DATA:  69 year old male with hypoxia. Oxygen desaturation. Shortness of breath. EXAM: PORTABLE CHEST 1 VIEW COMPARISON:  Chest x-ray 07/30/2019.  Chest  CTA 07/30/2019. FINDINGS: Previously noted left upper lobe mass appears larger on today's examination, which could represent growth of the lesion, or may reflect surrounding postobstructive changes. This lesion is again invasive into the left chest wall with gross destruction of the posterolateral aspect of the left third rib. Enlarging small left pleural effusion which appears partially loculated in the periphery of the left base. Increasing areas of ill-defined opacity and interstitial prominence throughout the lungs bilaterally (left greater than right), likely reflects evolving multilobar pneumonia. Pulmonary vasculature is partially obscured, but there is no evidence to suggest frank pulmonary edema. Heart size is normal. Aortic atherosclerosis. IMPRESSION: 1. Interval worsening of aeration throughout the lungs bilaterally, concerning for developing multilobar pneumonia. 2. Enlargement of mass in left upper lobe which may reflect progression of the underlying  neoplasm but also likely represents worsening adjacent postobstructive changes. Associated with this there is an enlarging small left partially loculated pleural effusion. Electronically Signed   By: Vinnie Langton M.D.   On: 08/01/2019 08:12    Patient Profile     69 year old male with past medical history of paroxysmal atrial fibrillation, chronic diastolic congestive heart failure, COPD, pancreatitis, hypertension, hyperlipidemia, diabetes mellitus, prior splenectomy now with lung mass for evaluation of atrial fibrillation/flutter.  Patient had a nuclear study December 21 that showed no scar or ischemia; ejection fraction 61%.  Patient admitted with worsening dyspnea on exertion, elevated heart rate and dizziness.  He was found to have atrial fibrillation and was sent to the hospital.  Chest CT shows large left upper lobe lung mass with chest wall invasion and rib destruction and bronchoscopy is planned.  Cardiology now asked to evaluate preoperatively and also for atrial fibrillation.    Assessment & Plan    1 atrial fibrillation with rapid ventricular response-back in sinus rhythm this morning.  Continue metoprolol but change to 75 mg twice daily.  Continue IV amiodarone.  Continue IV heparin. CHADSvasc 4.  Await echocardiogram to assess LV function.  2 preoperative evaluation prior to bronchoscopy-No CP and recent nuclear study negative.  Pt may proceed without further cardiac eval.  3 question chronic diastolic congestive heart failure-patient does not appear to be volume overloaded on examination.  We will not diurese at this point.  4 lung mass-likely primary cancer.  For bronchoscopy once cardiac status stable.  5 history of chronic pancreatitis.  For questions or updates, please contact [place] Please consult [email or web address] for contact info under        Signed, Kirk Ruths, MD  08/02/2019, 8:30 AM

## 2019-08-02 NOTE — Progress Notes (Signed)
Patient was sustaining in the 130 to 140s around 2 am tonight.  Gave patient IV metropolol w/o any results.  Paged Cardiology on call, Dr. Winfield Cunas and ordered Amiodarone for patient.  Amio was going to be infused at 0400 but IV got occluded and waited for new one.  Patient went back to SR around 0430, so Amio never started to infuse.  I paged Dr. Marletta Lor and advise of patient going into SR in the 80-90s.  Per MD, to continue metropolol as ordered po.   Amio wil be discontinue.

## 2019-08-02 NOTE — Progress Notes (Addendum)
Physical Therapy Treatment Patient Details Name: Timothy Martinez MRN: [id number] DOB: 1951-01-02 Today's Date: 08/02/2019    History of Present Illness 69 yo man with a history of tobacco abuse, COPD, atrial fibrillation, atrial flutter, ethanol abuse, chronic pancreatitis, hypertension, hyperlipidemia, and chronic back pain. Admitted due to dizziness with hypotension, weakness and anemia  CT of the chest confirmed a large left upper lobe mass with chest wall invasion along with mediastinal and hilar adenopathy.    PT Comments    Patient seen for mobility progression. Pt tolerated ambulating 24 ft with supervision/min guard assist and SpO2 91% on 8L O2 HFNC. Given pt's current mobility level recommending HHPT for further skilled PT services to maximize independence and safety with mobility. Per CM note pt is able to stay with one of his daughters at d/c and given that pt has a flight of stairs and lives alone would likely be his safest choice initially after d/c. Continue to progress as tolerated.      Follow Up Recommendations  Supervision - Intermittent;Home health PT     Equipment Recommendations  None recommended by PT    Recommendations for Other Services       Precautions / Restrictions Precautions Precautions: Fall Precaution Comments: reports has fallen down 7 of the 17 steps to his apartment, but denies injury Restrictions Weight Bearing Restrictions: No Other Position/Activity Restrictions: check BP    Mobility  Bed Mobility Overal bed mobility: Modified Independent Bed Mobility: Supine to Sit;Sit to Supine           General bed mobility comments: use of rail and increaesd time to get into sitting   Transfers Overall transfer level: Needs assistance Equipment used: None Transfers: Sit to/from Stand Sit to Stand: Supervision            Ambulation/Gait Ambulation/Gait assistance: Supervision;Min guard Gait Distance (Feet): 24 Feet Assistive device:  None Gait Pattern/deviations: Step-through pattern;Decreased stride length Gait velocity: decreased   General Gait Details: assist to steady; pt SOB and after seated rest declined ambulating again    Stairs             Wheelchair Mobility    Modified Rankin (Stroke Patients Only)       Balance Overall balance assessment: Needs assistance Sitting-balance support: Feet unsupported Sitting balance-Leahy Scale: Good     Standing balance support: Single extremity supported;Bilateral upper extremity supported;During functional activity Standing balance-Leahy Scale: Fair                              Cognition Arousal/Alertness: Awake/alert Behavior During Therapy: Anxious;Agitated(when SOB ) Overall Cognitive Status: No family/caregiver present to determine baseline cognitive functioning                                 General Comments: pt appears confused at times and states "i don't need that. are you putting oxygen on me?" however pt already on 8L O2 HFNC      Exercises      General Comments General comments (skin integrity, edema, etc.): SpO2 91% on 8L O2 HFNC; HR in 80s      Pertinent Vitals/Pain Pain Assessment: Faces Faces Pain Scale: Hurts little more Pain Location: back Pain Descriptors / Indicators: Sore Pain Intervention(s): Limited activity within patient's tolerance;Monitored during session;Repositioned    Home Living  Prior Function            PT Goals (current goals can now be found in the care plan section) Acute Rehab PT Goals Patient Stated Goal: to return to independent, go home Progress towards PT goals: Progressing toward goals    Frequency    Min 3X/week      PT Plan Discharge plan needs to be updated    Co-evaluation              AM-PAC PT "6 Clicks" Mobility   Outcome Measure  Help needed turning from your back to your side while in a flat bed without using  bedrails?: None Help needed moving from lying on your back to sitting on the side of a flat bed without using bedrails?: None Help needed moving to and from a bed to a chair (including a wheelchair)?: A Little Help needed standing up from a chair using your arms (e.g., wheelchair or bedside chair)?: A Little Help needed to walk in hospital room?: A Little Help needed climbing 3-5 steps with a railing? : A Little 6 Click Score: 20    End of Session Equipment Utilized During Treatment: Gait belt Activity Tolerance: Patient limited by fatigue;Other (comment)(DOE) Patient left: in bed;with call bell/phone within reach;with bed alarm set Nurse Communication: Mobility status PT Visit Diagnosis: Difficulty in walking, not elsewhere classified (R26.2);Pain Pain - Right/Left: Left Pain - part of body: Shoulder     Time: 6644-0347 PT Time Calculation (min) (ACUTE ONLY): 18 min  Charges:  $Gait Training: 8-22 mins                     Earney Navy, PTA Acute Rehabilitation Services Pager: [phone] Office: [phone]     Darliss Cheney 08/02/2019, 4:42 PM

## 2019-08-02 NOTE — Progress Notes (Signed)
[place] for Heparin Indication: atrial fibrillation  Allergies  Allergen Reactions  . Prozac [Fluoxetine Hcl] Nausea Only    Made the patient very sick to his stomach  . Morphine And Related Swelling    Pt states he can tolerate oxycodone and hydromorphone  . Penicillins Rash    Has patient had a PCN reaction causing immediate rash, facial/tongue/throat swelling, SOB or lightheadedness with hypotension: Yes Has patient had a PCN reaction causing severe rash involving mucus membranes or skin necrosis: No Has patient had a PCN reaction that required hospitalization: No Has patient had a PCN reaction occurring within the last 10 years: No If all of the above answers are "NO", then may proceed with Cephalosporin use.     Patient Measurements: Height: 6\' 3"  (190.5 cm) Weight: 175 lb (79.4 kg) IBW/kg (Calculated) : 84.5 Heparin Dosing Weight: 74.4 kg  Vital Signs: Temp: 97.9 F (36.6 C) (01/08 1500) Temp Source: Oral (01/08 1500) BP: 117/68 (01/08 1500) Pulse Rate: 73 (01/08 1500)  Labs: Recent Labs    07/30/19 1924 07/31/19 0554 07/31/19 1620 08/01/19 0821 08/01/19 0912 08/01/19 1806 08/02/19 0345 08/02/19 0808 08/02/19 1634  HGB  --  7.5*  --  8.5* 8.4* 8.2*  --   --  8.6*  HCT  --  24.3*  --  26.8* 26.1* 25.6*  --   --  25.9*  PLT  --  382  --  388 403* 407*  --   --  409*  APTT  --   --  49*  --   --  71*  --   --   --   LABPROT  --   --  23.0*  --   --   --   --   --   --   INR  --   --  2.1*  --   --   --   --   --   --   HEPARINUNFRC  --   --   --   --   --  0.55  --  0.27* 0.17*  CREATININE  --  1.12  --  0.85  --   --  0.87  --   --   TROPONINIHS 12  --   --   --   --   --   --   --   --     Estimated Creatinine Clearance: 92.5 mL/min (by C-G formula based on SCr of 0.87 mg/dL).   Medical History: Past Medical History:  Diagnosis Date  . Acute on chronic pancreatitis ([place]) 05/17/2016  . Alcohol abuse   .  Alcoholic pancreatitis   . Anginal pain ([place])   . Anxiety and depression 01/20/2015   PHQ 9 = 11 (01/20/15)    . Arthritis    "eat up w/it" (05/17/2016)  . Atrial fibrillation status post cardioversion ([place]) 08/2014  . Atrial flutter, unspecified   . CHF (congestive heart failure) ([place])   . Chronic anticoagulation   . Chronic atrial fibrillation ([place])   . Chronic bronchitis ([place])   . Chronic left-sided low back pain with sciatica 12/21/2012  . Chronic pancreatitis ([place])   . COLD (chronic obstructive lung disease) ([place])   . Complication of anesthesia    pt C/O confusion after procedure in April 2020  . Daily headache    "need eye examined" (05/17/2016)  . Gastric outlet obstruction 10/2018  . GERD (gastroesophageal reflux disease)   . Hematuria, microscopic 02/13/2015  Noted on UA - Recheck UA at next visit ~ 1 months   . Hyperlipidemia   . Hypertension   . Insomnia 02/11/2016  . Lateral epicondylitis of right elbow 03/17/2017  . Left foot pain 10/12/2013  . Loss of weight 02/19/2018  . Low back pain 12/21/2012  . Malnutrition of moderate degree 05/18/2016  . Neck pain, bilateral posterior 10/15/2014  . Neuropathy 03/16/2015  . Pain of joint of left ankle and foot 06/13/2018  . Pain of right upper extremity 05/30/2016  . Pancreatic pseudocyst    seen on CT scan 07/2014  . Pancreatitis 08/04/2014  . Permanent atrial fibrillation ([place]) 05/05/2017  . Pneumonia    "?a few times" (05/17/2016)  . 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 Tobacco abuse 05/30/2011   Heavy smoker up to 3 ppd down to 15 cigs per day in 09/2014   . Type II diabetes mellitus ([place])   . Wears dentures   . Wears glasses     Medications:  Scheduled:  . acetaminophen  650 mg Oral Q6H  . atorvastatin  20 mg Oral q1800  . doxycycline  100 mg Oral Q12H  . famotidine  20 mg Oral BID  . feeding supplement (ENSURE ENLIVE)  237 mL Oral TID BM  . gabapentin  100 mg Oral TID  . lipase/protease/amylase  72,000 Units Oral TID WC  .  magnesium oxide  400 mg Oral Daily  . metoprolol tartrate  75 mg Oral BID  . mometasone-formoterol  2 puff Inhalation BID  . multivitamin with minerals  1 tablet Oral Daily  . pantoprazole  40 mg Oral Daily  . polyethylene glycol  17 g Oral Daily    Assessment: Timothy Martinez is a 51 YOM presenting with dizziness and dyspnea. PMH significant for atrial fibrillation on Xarelto PTA, alcoholic pancreatitis, GERD, COPD, T2DM, HF and HLD. CHADsVASc 4 (age, HTN, DM, HF). Last dose of Xarelto was on 07/29/2019.  -Heparin initiated on 07/31/2019 due to increased respiratory needs and upcoming procedure, however not started due to drop in hemoglobin.  -On 08/01/2019 heparin initiated for anticipated lung procedure with IV bolus 3000u and IV drip 1100 u/h. 6 hour HL within goal at 0.55 and aPTT within goal at 71s.   PM follow up - Heparin level now below goal range.  Earlier heparin levels may still have had a bit of Eliquis effect left over. Spoke to RN, no issues with IV line.  Goal of Therapy:  Heparin level 0.3-0.7 units/ml Monitor platelets by anticoagulation protocol: Yes   Plan:  Increase Heparin IV drip to 1400 units/hr Recheck heparin level in 6 hrs. Daily heparin level and CBC.  Marguerite Olea, [facility] Clinical Pharmacist Phone [phone]  08/02/2019 5:45 PM

## 2019-08-02 NOTE — Evaluation (Signed)
Occupational Therapy Evaluation Patient Details Name: Timothy Martinez MRN: [id number] DOB: Oct 04, 1950 Today's Date: 08/02/2019    History of Present Illness 69 yo man with a history of tobacco abuse, COPD, atrial fibrillation, atrial flutter, ethanol abuse, chronic pancreatitis, hypertension, hyperlipidemia, and chronic back pain. Admitted due to dizziness with hypotension, weakness and anemia  CT of the chest confirmed a large left upper lobe mass with chest wall invasion along with mediastinal and hilar adenopathy.   Clinical Impression   Pt with decline in function and safety with ADLs and ADL mobility with impaired strength, balance and endurance. Pt very distracted by lines and IV, agitated and anxious. Pt demos self limiting behavior by stating multiple times "I can't" before even attempting and activity. Pt lives at home alone and reports that he was independent with ADL/selfcare, home mgt and driving, has a cane but doesn't use it. Pt would benefit from acute OT services to address impairments to maximize level of function and safety    Follow Up Recommendations  Home health OT    Equipment Recommendations  Tub/shower seat    Recommendations for Other Services       Precautions / Restrictions Precautions Precautions: Fall Precaution Comments: reports has fallen down 7 of the 17 steps to his apartment, but denies injury Restrictions Weight Bearing Restrictions: No      Mobility Bed Mobility Overal bed mobility: Needs Assistance Bed Mobility: Supine to Sit;Sit to Supine     Supine to sit: Supervision Sit to supine: Supervision   General bed mobility comments: assist for safety with lines as not managing and distracted by  Transfers Overall transfer level: Modified independent Equipment used: Rolling walker (2 wheeled);None             General transfer comment: cues for safety and hand placement    Balance   Sitting-balance support: Feet unsupported Sitting  balance-Leahy Scale: Good     Standing balance support: Single extremity supported;Bilateral upper extremity supported;During functional activity Standing balance-Leahy Scale: Fair                             ADL either performed or assessed with clinical judgement   ADL Overall ADL's : Needs assistance/impaired     Grooming: Wash/dry hands;Wash/dry face;Set up;Sitting   Upper Body Bathing: Set up;Supervision/ safety;Sitting   Lower Body Bathing: Minimal assistance;Sitting/lateral leans;Sit to/from stand   Upper Body Dressing : Set up;Supervision/safety;Sitting   Lower Body Dressing: Minimal assistance;Sitting/lateral leans;Sit to/from stand   Toilet Transfer: Supervision/safety;Ambulation;RW Toilet Transfer Details (indicate cue type and reason): simulated to recliner Toileting- Clothing Manipulation and Hygiene: Min guard;Sit to/from stand       Functional mobility during ADLs: Supervision/safety       Vision Patient Visual Report: No change from baseline       Perception     Praxis      Pertinent Vitals/Pain Pain Assessment: No/denies pain Pain Score: 0-No pain Pain Intervention(s): Monitored during session     Hand Dominance Right   Extremity/Trunk Assessment Upper Extremity Assessment Upper Extremity Assessment: Generalized weakness   Lower Extremity Assessment Lower Extremity Assessment: Defer to PT evaluation       Communication Communication Communication: No difficulties   Cognition Arousal/Alertness: Awake/alert Behavior During Therapy: Anxious;Agitated Overall Cognitive Status: No family/caregiver present to determine baseline cognitive functioning  General Comments: distracted by IV and lines. Will state "I can't"  before he even tries an instructed activity   General Comments       Exercises     Shoulder Instructions      Home Living Family/patient expects to be discharged  to:: Private residence Living Arrangements: Alone Available Help at Discharge: Family Type of Home: Apartment Home Access: Stairs to enter Technical brewer of Steps: 17 Entrance Stairs-Rails: Left;Right Home Layout: One level     Bathroom Shower/Tub: Teacher, early years/pre: [place]: [place] - single point          Prior Functioning/Environment Level of Independence: Independent                 OT Problem List: Decreased strength;Decreased activity tolerance;Decreased knowledge of use of DME or AE;Impaired balance (sitting and/or standing);Pain;Decreased safety awareness      OT Treatment/Interventions: Self-care/ADL training;DME and/or AE instruction;Therapeutic activities;Therapeutic exercise;Patient/family education    OT Goals(Current goals can be found in the care plan section) Acute Rehab OT Goals Patient Stated Goal: to return to independent, go home OT Goal Formulation: With patient Time For Goal Achievement: 08/16/19 Potential to Achieve Goals: Good ADL Goals Pt Will Perform Grooming: with min guard assist;with supervision;with set-up;standing Pt Will Perform Upper Body Bathing: with set-up;sitting Pt Will Perform Lower Body Bathing: with min guard assist;with supervision;with set-up;sitting/lateral leans;sit to/from stand Pt Will Perform Upper Body Dressing: with set-up;sitting Pt Will Perform Lower Body Dressing: with min guard assist;with supervision;with set-up;sitting/lateral leans;sit to/from stand Pt Will Transfer to Toilet: with modified independence;ambulating Pt Will Perform Toileting - Clothing Manipulation and hygiene: with supervision;with modified independence;sit to/from stand Pt Will Perform Tub/Shower Transfer: with supervision;with modified independence;ambulating;3 in 1;shower seat;grab bars  OT Frequency: Min 2X/week   Barriers to D/C: Decreased caregiver support          Co-evaluation               AM-PAC OT "6 Clicks" Daily Activity     Outcome Measure Help from another person eating meals?: None Help from another person taking care of personal grooming?: None Help from another person toileting, which includes using toliet, bedpan, or urinal?: A Little Help from another person bathing (including washing, rinsing, drying)?: A Little Help from another person to put on and taking off regular upper body clothing?: None Help from another person to put on and taking off regular lower body clothing?: A Little 6 Click Score: 21   End of Session Equipment Utilized During Treatment: Rolling walker Nurse Communication: Mobility status  Activity Tolerance: Patient limited by fatigue Patient left: in bed;with call bell/phone within reach;with bed alarm set  OT Visit Diagnosis: Other abnormalities of gait and mobility (R26.89);History of falling (Z91.81);Pain;Muscle weakness (generalized) (M62.81)                Time: 5916-3846 OT Time Calculation (min): 28 min Charges:  OT General Charges $OT Visit: 1 Visit OT Evaluation $OT Eval Moderate Complexity: 1 Mod OT Treatments $Self Care/Home Management : 8-22 mins    Britt Bottom 08/02/2019, 12:43 PM

## 2019-08-02 NOTE — TOC Initial Note (Signed)
Transition of Care [facility]) - Initial/Assessment Note    Patient Details  Name: Timothy Martinez MRN: [id number] Date of Birth: [birth year]  Transition of Care [facility]) CM/SW Contact:    Bethena Roys, RN Phone Number: 08/02/2019, 2:42 PM  Clinical Narrative:  Patient presented for dizziness. Case Manager received a call from patient's daughter Timothy Martinez at [phone]. Timothy Martinez wanted to give the Case Manager an update regarding home life for patient. Patient rents the top area in a single family home in [place]. Patient has a small kitchen area/bathroom upstairs. Per daughter patient has about 15-17 stairs to climb to get up to his level. Patient does not have any durable medical equipment in the home. Patient was independent prior to arrival. Patient has a sister and brother that lives in [place]. Daughter Timothy Martinez lives in [facility] and Daughter Timothy Martinez lives in [place]. Case Manager awaiting PT consult for recommendations. Both daughters are agreeable to have patient come and stay with them if needed. Case Manager will continue to follow for additional transition of care needs.                   Expected Discharge Plan: [place] Barriers to Discharge: Continued Medical Work up(Plan for bronhcoscopy)   Patient Goals and CMS Choice Patient states their goals for this hospitalization and ongoing recovery are:: "to return home"      Expected Discharge Plan and Services Expected Discharge Plan: [place] In-house Referral: NA Discharge Planning Services: CM Consult Post Acute Care Choice: [place] arrangements for the past 2 months: Single Family Home(Patient rents the upstairs of the home- has a small kitcehn area upstairs- has 15-17 steps to get upstairs.)                  Prior Living Arrangements/Services Living arrangements for the past 2 months: Single Family Home(Patient rents the upstairs of the home- has a small  kitcehn area upstairs- has 15-17 steps to get upstairs.) Lives with:: Self Patient language and need for interpreter reviewed:: Yes Do you feel safe going back to the place where you live?: Yes      Need for Family Participation in Patient Care: Yes (Comment) Care giver support system in place?: Yes (comment)      Activities of Daily Living Home Assistive Devices/Equipment: None ADL Screening (condition at time of admission) Patient's cognitive ability adequate to safely complete daily activities?: Yes Is the patient deaf or have difficulty hearing?: No Does the patient have difficulty seeing, even when wearing glasses/contacts?: No Does the patient have difficulty concentrating, remembering, or making decisions?: No Patient able to express need for assistance with ADLs?: Yes Does the patient have difficulty dressing or bathing?: No Independently performs ADLs?: Yes (appropriate for developmental age) Does the patient have difficulty walking or climbing stairs?: Yes Weakness of Legs: None Weakness of Arms/Hands: None  Permission Sought/Granted Permission sought to share information with : Family Supports             Permission granted to share info w Contact Information: Timothy Martinez daughter [phone], Timothy Martinez- (Daughter she is a Therapist, sports) [phone]  Emotional Assessment Appearance:: Appears stated age     Orientation: : Oriented to Self, Oriented to Place, Oriented to  Time, Oriented to Situation Alcohol / Substance Use: Not Applicable Psych Involvement: No (comment)  Admission diagnosis:  Lightheadedness [R42] Lung mass [R91.8] Mass of upper lobe of left lung [R91.8] Lesion of left ear [  H61.92] Patient Active Problem List   Diagnosis Date Noted  . Hypoxia   . Acute anemia   . Lung mass 07/31/2019  . Mass of upper lobe of left lung 07/30/2019  . Urinary frequency 06/12/2019  . Left shoulder pain 03/05/2019  . Lesion of left ear 03/05/2019  . Exocrine pancreatic  insufficiency 01/24/2019  . Hypoglycemia associated with diabetes ([place]) 11/21/2018  . Esophagitis determined by endoscopy 11/21/2018  . Gastric ulcer 11/21/2018  . Gastric outlet obstruction 11/19/2018  . Hypovolemia dehydration 11/19/2018  . History of colonic polyps 05/25/2018  . Dilation of pancreatic duct 04/16/2018  . 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 Protein-calorie malnutrition, severe 03/22/2018  . Other partial intestinal obstruction ([place])   . Atrial fibrillation with RVR ([place]) 03/21/2018  . Pseudocyst of pancreas 03/11/2018  . Solitary pulmonary nodule 05/12/2017  . On continuous oral anticoagulation 06/19/2015  . Type 2 diabetes mellitus without complication ([place])   . Atrial fibrillation with rapid ventricular response ([place])   . Chronic pancreatitis ([place]) 08/04/2014  . Abdominal pain 08/04/2014  . Emphysema lung ([place]) 04/27/2012  . GERD (gastroesophageal reflux disease) 03/05/2012  . Chronic alcoholic pancreatitis ([place]) 05/30/2011  . Tobacco abuse 05/30/2011   PCP:  [name], DO Pharmacy:   [id number] - [place], [place] [address] [place] [address] Phone: [phone] Fax: [phone]  [facility] - [place], [place] [facility] [id number] [place] [address] Phone: [phone] Fax: [phone]     Social Determinants of Health (SDOH) Interventions    Readmission Risk Interventions No flowsheet data found.

## 2019-08-02 NOTE — Significant Event (Signed)
Cardiology [place] Event Note  Paged by RN that patient increasingly tachycardic through the night. EKG now showing aflutter with 2:1 conduction. BP marginal at 90/60s after receiving PRN IV metoprolol. Patient asymptomatic.   Referred to Dr. Jacalyn Lefevre consult note from 1/7, which recommended starting IV amiodarone in addition to scheduled metoprolol. Have ordered infusion. Cardiology will continue to follow.   Emalene Welte K. Marletta Lor, MD

## 2019-08-02 NOTE — Progress Notes (Addendum)
ANTICOAGULATION CONSULT NOTE - Initial Consult  Pharmacy Consult for Heparin Indication: atrial fibrillation  Allergies  Allergen Reactions  . Prozac [Fluoxetine Hcl] Nausea Only    Made the patient very sick to his stomach  . Morphine And Related Swelling    Pt states he can tolerate oxycodone and hydromorphone  . Penicillins Rash    Has patient had a PCN reaction causing immediate rash, facial/tongue/throat swelling, SOB or lightheadedness with hypotension: Yes Has patient had a PCN reaction causing severe rash involving mucus membranes or skin necrosis: No Has patient had a PCN reaction that required hospitalization: No Has patient had a PCN reaction occurring within the last 10 years: No If all of the above answers are "NO", then may proceed with Cephalosporin use.     Patient Measurements: Height: 6\' 3"  (190.5 cm) Weight: 175 lb (79.4 kg) IBW/kg (Calculated) : 84.5 Heparin Dosing Weight: 74.4 kg  Vital Signs: Temp: 97.8 F (36.6 C) (01/08 0741) Temp Source: Oral (01/08 0741) BP: 116/63 (01/08 0805) Pulse Rate: 88 (01/08 0805)  Labs: Recent Labs    07/30/19 1719 07/30/19 1924 07/31/19 0554 07/31/19 1620 08/01/19 0821 08/01/19 0912 08/01/19 1806 08/02/19 0345 08/02/19 0808  HGB 8.4*  --  7.5*  --  8.5* 8.4* 8.2*  --   --   HCT 26.8*  --  24.3*  --  26.8* 26.1* 25.6*  --   --   PLT 418*  --  382  --  388 403* 407*  --   --   APTT  --   --   --  49*  --   --  71*  --   --   LABPROT  --   --   --  23.0*  --   --   --   --   --   INR  --   --   --  2.1*  --   --   --   --   --   HEPARINUNFRC  --   --   --   --   --   --  0.55  --  0.27*  CREATININE 1.20  --  1.12  --  0.85  --   --  0.87  --   TROPONINIHS 12 12  --   --   --   --   --   --   --     Estimated Creatinine Clearance: 92.5 mL/min (by C-G formula based on SCr of 0.87 mg/dL).   Medical History: Past Medical History:  Diagnosis Date  . Acute on chronic pancreatitis ([place]) 05/17/2016  . Alcohol  abuse   . Alcoholic pancreatitis   . Anginal pain ([place])   . Anxiety and depression 01/20/2015   PHQ 9 = 11 (01/20/15)    . Arthritis    "eat up w/it" (05/17/2016)  . Atrial fibrillation status post cardioversion ([place]) 08/2014  . Atrial flutter, unspecified   . CHF (congestive heart failure) ([place])   . Chronic anticoagulation   . Chronic atrial fibrillation ([place])   . Chronic bronchitis ([place])   . Chronic left-sided low back pain with sciatica 12/21/2012  . Chronic pancreatitis ([place])   . COLD (chronic obstructive lung disease) ([place])   . Complication of anesthesia    pt C/O confusion after procedure in April 2020  . Daily headache    "need eye examined" (05/17/2016)  . Gastric outlet obstruction 10/2018  . GERD (gastroesophageal reflux disease)   . Hematuria, microscopic 02/13/2015  Noted on UA - Recheck UA at next visit ~ 1 months   . Hyperlipidemia   . Hypertension   . Insomnia 02/11/2016  . Lateral epicondylitis of right elbow 03/17/2017  . Left foot pain 10/12/2013  . Loss of weight 02/19/2018  . Low back pain 12/21/2012  . Malnutrition of moderate degree 05/18/2016  . Neck pain, bilateral posterior 10/15/2014  . Neuropathy 03/16/2015  . Pain of joint of left ankle and foot 06/13/2018  . Pain of right upper extremity 05/30/2016  . Pancreatic pseudocyst    seen on CT scan 07/2014  . Pancreatitis 08/04/2014  . Permanent atrial fibrillation ([place]) 05/05/2017  . Pneumonia    "?a few times" (05/17/2016)  . 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 Tobacco abuse 05/30/2011   Heavy smoker up to 3 ppd down to 15 cigs per day in 09/2014   . Type II diabetes mellitus ([place])   . Wears dentures   . Wears glasses     Medications:  Scheduled:  . acetaminophen  650 mg Oral Q6H  . atorvastatin  20 mg Oral q1800  . doxycycline  100 mg Oral Q12H  . famotidine  20 mg Oral BID  . feeding supplement (ENSURE ENLIVE)  237 mL Oral TID BM  . gabapentin  100 mg Oral TID  . lipase/protease/amylase  72,000 Units Oral  TID WC  . magnesium oxide  400 mg Oral Daily  . metoprolol tartrate  75 mg Oral BID  . mometasone-formoterol  2 puff Inhalation BID  . multivitamin with minerals  1 tablet Oral Daily  . pantoprazole  40 mg Oral Daily  . polyethylene glycol  17 g Oral Daily    Assessment: MS is a 24 YOM presenting with dizziness and dyspnea. PMH significant for atrial fibrillation on Xarelto PTA, alcoholic pancreatitis, GERD, COPD, T2DM, HF and HLD. CHADsVASc 4 (age, HTN, DM, HF). Last dose of Xarelto was on 07/29/2019.  -Heparin initiated on 07/31/2019 due to increased respiratory needs and upcoming procedure, however not started due to drop in hemoglobin.  -On 08/01/2019 heparin initiated for anticipated lung procedure with IV bolus 3000u and IV drip 1100 u/h. 6 hour HL within goal at 0.55 and aPTT within goal at 71s.  -08/02/2019 AM HL subtherapeutic at 0.27. No longer following aPTT as the previous level correlated with HL. No reports of active bleeding today or infusion issues overnight.  Goal of Therapy:  Heparin level 0.3-0.7 units/ml Monitor platelets by anticoagulation protocol: Yes   Plan:  Increase Heparin IV drip to 1250 units/hour Check 6hr HL @ 1600 Daily HL, CBC Monitor for s/sx of bleeding  Judene Companion, PharmD Candidate 08/02/2019,9:13 AM    I discussed / reviewed the pharmacy note by Judene Companion, Pharm D Candidate and I agree with the student's findings and plans as documented.   Lorel Monaco, PharmD PGY1 Ambulatory Care Resident [facility] # [phone]

## 2019-08-03 ENCOUNTER — Inpatient Hospital Stay (HOSPITAL_COMMUNITY): Payer: Medicare Other

## 2019-08-03 LAB — CBC WITH DIFFERENTIAL/PLATELET
Abs Immature Granulocytes: 0.46 10*3/uL — ABNORMAL HIGH (ref 0.00–0.07)
Basophils Absolute: 0.1 10*3/uL (ref 0.0–0.1)
Basophils Relative: 0 %
Eosinophils Absolute: 0.1 10*3/uL (ref 0.0–0.5)
Eosinophils Relative: 0 %
HCT: 27.7 % — ABNORMAL LOW (ref 39.0–52.0)
Hemoglobin: 9.4 g/dL — ABNORMAL LOW (ref 13.0–17.0)
Immature Granulocytes: 3 %
Lymphocytes Relative: 11 %
Lymphs Abs: 1.9 10*3/uL (ref 0.7–4.0)
MCH: 27.6 pg (ref 26.0–34.0)
MCHC: 33.9 g/dL (ref 30.0–36.0)
MCV: 81.5 fL (ref 80.0–100.0)
Monocytes Absolute: 2 10*3/uL — ABNORMAL HIGH (ref 0.1–1.0)
Monocytes Relative: 11 %
Neutro Abs: 13.7 10*3/uL — ABNORMAL HIGH (ref 1.7–7.7)
Neutrophils Relative %: 75 %
Platelets: 402 10*3/uL — ABNORMAL HIGH (ref 150–400)
RBC: 3.4 MIL/uL — ABNORMAL LOW (ref 4.22–5.81)
RDW: 15.2 % (ref 11.5–15.5)
WBC: 18.3 10*3/uL — ABNORMAL HIGH (ref 4.0–10.5)
nRBC: 2.1 % — ABNORMAL HIGH (ref 0.0–0.2)

## 2019-08-03 LAB — HEPARIN LEVEL (UNFRACTIONATED)
Heparin Unfractionated: 0.16 IU/mL — ABNORMAL LOW (ref 0.30–0.70)
Heparin Unfractionated: 0.26 IU/mL — ABNORMAL LOW (ref 0.30–0.70)
Heparin Unfractionated: 0.18 IU/mL — ABNORMAL LOW (ref 0.30–0.70)

## 2019-08-03 LAB — COMPREHENSIVE METABOLIC PANEL
ALT: 17 U/L (ref 0–44)
AST: 25 U/L (ref 15–41)
Albumin: 1.5 g/dL — ABNORMAL LOW (ref 3.5–5.0)
Alkaline Phosphatase: 285 U/L — ABNORMAL HIGH (ref 38–126)
Anion gap: 8 (ref 5–15)
BUN: 18 mg/dL (ref 8–23)
CO2: 22 mmol/L (ref 22–32)
Calcium: 8 mg/dL — ABNORMAL LOW (ref 8.9–10.3)
Chloride: 107 mmol/L (ref 98–111)
Creatinine, Ser: 0.78 mg/dL (ref 0.61–1.24)
GFR calc Af Amer: >60 mL/min (ref >60)
GFR calc non Af Amer: >60 mL/min (ref >60)
Glucose, Bld: 111 mg/dL — ABNORMAL HIGH (ref 70–99)
Potassium: 3.3 mmol/L — ABNORMAL LOW (ref 3.5–5.1)
Sodium: 137 mmol/L (ref 135–145)
Total Bilirubin: 0.9 mg/dL (ref 0.3–1.2)
Total Protein: 4.9 g/dL — ABNORMAL LOW (ref 6.5–8.1)

## 2019-08-03 LAB — CBC
HCT: 24.8 % — ABNORMAL LOW (ref 39.0–52.0)
Hemoglobin: 8.4 g/dL — ABNORMAL LOW (ref 13.0–17.0)
MCH: 27.5 pg (ref 26.0–34.0)
MCHC: 33.9 g/dL (ref 30.0–36.0)
MCV: 81.3 fL (ref 80.0–100.0)
Platelets: 387 10*3/uL (ref 150–400)
RBC: 3.05 MIL/uL — ABNORMAL LOW (ref 4.22–5.81)
RDW: 15.4 % (ref 11.5–15.5)
WBC: 17.1 10*3/uL — ABNORMAL HIGH (ref 4.0–10.5)
nRBC: 1.2 % — ABNORMAL HIGH (ref 0.0–0.2)

## 2019-08-03 LAB — TYPE AND SCREEN
ABO/RH(D): O POS
Antibody Screen: NEGATIVE

## 2019-08-03 LAB — PATHOLOGIST SMEAR REVIEW

## 2019-08-03 LAB — PROTIME-INR
INR: 1.4 — ABNORMAL HIGH (ref 0.8–1.2)
Prothrombin Time: 16.8 seconds — ABNORMAL HIGH (ref 11.4–15.2)

## 2019-08-03 LAB — GLUCOSE, CAPILLARY: Glucose-Capillary: 106 mg/dL — ABNORMAL HIGH (ref 70–99)

## 2019-08-03 LAB — MAGNESIUM: Magnesium: 1.4 mg/dL — ABNORMAL LOW (ref 1.7–2.4)

## 2019-08-03 MED ORDER — AMIODARONE HCL 200 MG PO TABS
400.0000 mg | ORAL_TABLET | Freq: Two times a day (BID) | ORAL | Status: DC
  Administered 2019-08-03 – 2019-08-09 (×12): 400 mg via ORAL
  Filled 2019-08-03 (×12): qty 2

## 2019-08-03 MED ORDER — MAGNESIUM SULFATE 2 GM/50ML IV SOLN
2.0000 g | Freq: Once | INTRAVENOUS | Status: AC
  Administered 2019-08-03: 2 g via INTRAVENOUS
  Filled 2019-08-03: qty 50

## 2019-08-03 MED ORDER — POTASSIUM CHLORIDE CRYS ER 20 MEQ PO TBCR
40.0000 meq | EXTENDED_RELEASE_TABLET | Freq: Two times a day (BID) | ORAL | Status: AC
  Administered 2019-08-03: 40 meq via ORAL
  Filled 2019-08-03: qty 2

## 2019-08-03 MED ORDER — HEPARIN BOLUS VIA INFUSION
2000.0000 [IU] | Freq: Once | INTRAVENOUS | Status: AC
  Administered 2019-08-03: 2000 [IU] via INTRAVENOUS
  Filled 2019-08-03: qty 2000

## 2019-08-03 MED ORDER — LEVOFLOXACIN 750 MG PO TABS: 750.0000 mg | ORAL_TABLET | Freq: Every day | ORAL | Status: DC

## 2019-08-03 MED ORDER — LEVOFLOXACIN 750 MG PO TABS
750.0000 mg | ORAL_TABLET | Freq: Every day | ORAL | Status: DC
  Administered 2019-08-03 – 2019-08-08 (×6): 750 mg via ORAL
  Filled 2019-08-03 (×6): qty 1

## 2019-08-03 NOTE — Progress Notes (Addendum)
Pharmacy Antibiotic Note  Timothy Martinez is a 69 y.o. male admitted on 07/30/2019 with CAP. Patient started on doxycycline 1/7, however, would like to broaden coverage for strep coverage.Due to patients PCN allergy, will start levofloxacin and follow-up bronchostomy Monday, 08/05/19.  WBC improving, 22.2 down to 17.1, afebrile last 24 hours, Scr stable ~0.78.   Plan: Stop doxycycline Start levofloxacin 750 mg PO daily   Height: 6\' 3"  (190.5 cm) Weight: 162 lb 3.2 oz (73.6 kg) IBW/kg (Calculated) : 84.5  Temp (24hrs), Avg:97.8 F (36.6 C), Min:97.7 F (36.5 C), Max:97.9 F (36.6 C)  Recent Labs  Lab 07/30/19 1719 07/31/19 0554 08/01/19 0821 08/01/19 0912 08/01/19 1806 08/02/19 0345 08/02/19 1634 08/02/19 1849 08/03/19 0037  WBC 22.2* 21.3* 22.1* 22.1* 19.1*  --  17.4* 16.8* 17.1*  CREATININE 1.20 1.12 0.85  --   --  0.87  --   --  0.78    Estimated Creatinine Clearance: 93.3 mL/min (by C-G formula based on SCr of 0.78 mg/dL).    Allergies  Allergen Reactions  . Prozac [Fluoxetine Hcl] Nausea Only    Made the patient very sick to his stomach  . Morphine And Related Swelling    Pt states he can tolerate oxycodone and hydromorphone  . Penicillins Rash    Has patient had a PCN reaction causing immediate rash, facial/tongue/throat swelling, SOB or lightheadedness with hypotension: Yes Has patient had a PCN reaction causing severe rash involving mucus membranes or skin necrosis: No Has patient had a PCN reaction that required hospitalization: No Has patient had a PCN reaction occurring within the last 10 years: No If all of the above answers are "NO", then may proceed with Cephalosporin use.     Antimicrobials this admission: Vancomycin 1/7 x1 Doxycycline 1/7>>1/9 Levofloxacin 1/9 >>   Microbiology results: 1/5 COVID: negative 1/7 MRSA PCR: negative   Salah Nakamura L. Devin Going, [place] PGY1 Pharmacy Resident [phone] 08/03/19      1:05 PM  Please check AMION for  all [place] phone numbers After 10:00 PM, call the [place] [phone]

## 2019-08-03 NOTE — Progress Notes (Signed)
ANTICOAGULATION CONSULT NOTE - Follow Up Consult  Pharmacy Consult for heparin Indication: atrial fibrillation  Labs: Recent Labs    07/31/19 1620 08/01/19 0821 08/01/19 0912 08/01/19 1806 08/02/19 0345 08/02/19 0808 08/02/19 1634 08/02/19 1849 08/03/19 0037  HGB  --  8.5*  --  8.2*  --   --  8.6* 8.2* 8.4*  HCT  --  26.8*  --  25.6*  --   --  25.9* 24.8* 24.8*  PLT  --  388  --  407*  --   --  409* 382 387  APTT 49*  --   --  71*  --   --   --   --   --   LABPROT 23.0*  --   --   --   --   --   --   --  16.8*  INR 2.1*  --   --   --   --   --   --   --  1.4*  HEPARINUNFRC  --   --    < > 0.55  --  0.27* 0.17*  --  0.16*  CREATININE  --  0.85  --   --  0.87  --   --   --  0.78   < > = values in this interval not displayed.    Assessment: 69yo male remains subtherapeutic on heparin with lower heparin level despite increased rate, likely d/t Xarelto clearing; no gtt issues or signs of bleeding per RN.  Goal of Therapy:  Heparin level 0.3-0.7 units/ml   Plan:  Will give small heparin bolus of 2000 units and increase heparin gtt by 2-3 units/kg/hr to 1600 units/hr and check level in 6 hours.    Wynona Neat, PharmD, BCPS  08/03/2019,1:53 AM

## 2019-08-03 NOTE — Progress Notes (Signed)
ANTICOAGULATION CONSULT NOTE  Pharmacy Consult for Heparin Indication: atrial fibrillation  Patient Measurements: Height: 6\' 3"  (190.5 cm) Weight: 162 lb 3.2 oz (73.6 kg) IBW/kg (Calculated) : 84.5 Heparin Dosing Weight: 74.4 kg  Vital Signs: Temp: 97.7 F (36.5 C) (01/09 0801) Temp Source: Oral (01/09 0801) BP: 131/76 (01/09 0522) Pulse Rate: 66 (01/09 1200)  Labs: Recent Labs    08/01/19 0821 08/01/19 0912 08/01/19 1806 08/02/19 0345 08/02/19 0808 08/02/19 1634 08/02/19 1849 08/03/19 0037 08/03/19 0723 08/03/19 1507 08/03/19 1633  HGB 8.5*  --  8.2*  --    < >   < > 8.2* 8.4*  --   --  9.4*  HCT 26.8*  --  25.6*  --    < >  --  24.8* 24.8*  --   --  27.7*  PLT 388  --  407*  --    < >  --  382 387  --   --  402*  APTT  --   --  71*  --   --   --   --   --   --   --   --   LABPROT  --   --   --   --   --   --   --  16.8*  --   --   --   INR  --   --   --   --   --   --   --  1.4*  --   --   --   HEPARINUNFRC  --    < > 0.55  --   --   --   --  0.16* 0.26* 0.18*  --   CREATININE 0.85  --   --  0.87  --   --   --  0.78  --   --   --    < > = values in this interval not displayed.    Estimated Creatinine Clearance: 93.3 mL/min (by C-G formula based on SCr of 0.78 mg/dL).  Assessment: Timothy Martinez is a 7 YOM presenting with dizziness and dyspnea. PMH significant for atrial fibrillation on Xarelto PTA, alcoholic pancreatitis, GERD, COPD, T2DM, HF and HLD. CHADsVASc 4 (age, HTN, DM, HF). Last dose of Xarelto was on 07/29/2019.  Heparin level still subtherapeutic at 0.17 after increasing heparin gtt this AM.    Goal of Therapy:  Heparin level 0.3-0.7 units/ml Monitor platelets by anticoagulation protocol: Yes   Plan:  Increase Heparin IV drip to 1850 units/hr Recheck heparin level in 6 hrs. Daily heparin level and CBC.  Timothy Martinez, [facility] Clinical Pharmacist Phone [phone]  08/03/2019 5:13 PM

## 2019-08-03 NOTE — Progress Notes (Signed)
ANTICOAGULATION CONSULT NOTE  Pharmacy Consult for Heparin Indication: atrial fibrillation  Patient Measurements: Height: 6\' 3"  (190.5 cm) Weight: 162 lb 3.2 oz (73.6 kg) IBW/kg (Calculated) : 84.5 Heparin Dosing Weight: 74.4 kg  Vital Signs: Temp: 97.7 F (36.5 C) (01/09 0801) Temp Source: Oral (01/09 0801) BP: 131/76 (01/09 0522) Pulse Rate: 69 (01/09 0522)  Labs: Recent Labs    07/31/19 1620 08/01/19 0821 08/01/19 0912 08/01/19 1806 08/02/19 0345 08/02/19 1634 08/02/19 1849 08/03/19 0037 08/03/19 0723  HGB  --  8.5*  --  8.2*  --  8.6* 8.2* 8.4*  --   HCT  --  26.8*  --  25.6*  --  25.9* 24.8* 24.8*  --   PLT  --  388  --  407*  --  409* 382 387  --   APTT 49*  --   --  71*  --   --   --   --   --   LABPROT 23.0*  --   --   --   --   --   --  16.8*  --   INR 2.1*  --   --   --   --   --   --  1.4*  --   HEPARINUNFRC  --   --    < > 0.55  --  0.17*  --  0.16* 0.26*  CREATININE  --  0.85  --   --  0.87  --   --  0.78  --    < > = values in this interval not displayed.    Estimated Creatinine Clearance: 93.3 mL/min (by C-G formula based on SCr of 0.78 mg/dL).  Assessment: Timothy Martinez is a 66 YOM presenting with dizziness and dyspnea. PMH significant for atrial fibrillation on Xarelto PTA, alcoholic pancreatitis, GERD, COPD, T2DM, HF and HLD. CHADsVASc 4 (age, HTN, DM, HF). Last dose of Xarelto was on 07/29/2019.  Heparin level still slightly subtherapeutic at 0.26 after increasing heparin gtt from 1400 units/hour to 1600 units/hour. H&H stable between 8.0-8.5, no issues with infusion or bleeding reported.  Goal of Therapy:  Heparin level 0.3-0.7 units/ml Monitor platelets by anticoagulation protocol: Yes   Plan:  Increase Heparin IV drip to 1700 units/hr Recheck heparin level in 6 hrs. Daily heparin level and CBC.    Timothy Martinez Timothy Martinez, [place] PGY1 Pharmacy Resident [phone] 08/03/19      8:45 AM  Please check AMION for all [place] phone numbers After  10:00 PM, call the [place] [phone]

## 2019-08-03 NOTE — Progress Notes (Signed)
Progress Note  Patient Name: Timothy Martinez Date of Encounter: 08/03/2019  Primary Cardiologist: Mertie Moores, MD   Subjective   Denies CP or dyspnea  Inpatient Medications    Scheduled Meds:  acetaminophen  650 mg Oral Q6H   atorvastatin  20 mg Oral q1800   doxycycline  100 mg Oral Q12H   famotidine  20 mg Oral BID   feeding supplement (ENSURE ENLIVE)  237 mL Oral TID BM   gabapentin  100 mg Oral TID   lipase/protease/amylase  72,000 Units Oral TID WC   metoprolol tartrate  75 mg Oral BID   mometasone-formoterol  2 puff Inhalation BID   multivitamin with minerals  1 tablet Oral Daily   pantoprazole  40 mg Oral Daily   polyethylene glycol  17 g Oral Daily   Continuous Infusions:  amiodarone 30 mg/hr (08/02/19 2255)   heparin 1,700 Units/hr (08/03/19 0921)   magnesium sulfate bolus IVPB     PRN Meds: amitriptyline, levalbuterol, lipase/protease/amylase, metoprolol tartrate, nicotine, nitroGLYCERIN   Vital Signs    Vitals:   08/02/19 2347 08/03/19 0000 08/03/19 0522 08/03/19 0801  BP: 121/62  131/76   Pulse: 72 65 69   Resp: (!) 29 (!) 21 13   Temp: 97.8 F (36.6 C)  97.7 F (36.5 C) 97.7 F (36.5 C)  TempSrc: Oral  Oral Oral  SpO2: 98% 100% 95%   Weight:   73.6 kg   Height:        Intake/Output Summary (Last 24 hours) at 08/03/2019 1108 Last data filed at 08/03/2019 0816 Gross per 24 hour  Intake --  Output 1150 ml  Net -1150 ml   Last 3 Weights 08/03/2019 08/01/2019 07/30/2019  Weight (lbs) 162 lb 3.2 oz 175 lb 164 lb  Weight (kg) 73.573 kg 79.379 kg 74.39 kg      Telemetry    Sinus with PVC- Personally Reviewed   Physical Exam   GEN: No acute distress.  WD chronically ill appearing Neck: No JVD, supple Cardiac: RRR, normal rate Respiratory: Diminished BS throughout; no wheeze GI: Soft, NT, ND MS: No edema Neuro:  Grossly intact Psych: Normal affect   Labs    High Sensitivity Troponin:   Recent Labs  Lab 07/30/19 1719  07/30/19 1924  TROPONINIHS 12 12      Chemistry Recent Labs  Lab 08/01/19 0821 08/02/19 0345 08/03/19 0037  NA 140 136 137  K 4.0 3.6 3.3*  CL 111 107 107  CO2 19* 19* 22  GLUCOSE 115* 103* 111*  BUN 17 15 18   CREATININE 0.85 0.87 0.78  CALCIUM 7.9* 8.0* 8.0*  PROT 5.1* 5.2* 4.9*  ALBUMIN 1.7* 1.6* 1.5*  AST 32 28 25  ALT 19 18 17   ALKPHOS 229* 314* 285*  BILITOT 1.3* 1.2 0.9  GFRNONAA >60 >60 >60  GFRAA >60 >60 >60  ANIONGAP 10 10 8      Hematology Recent Labs  Lab 08/02/19 1634 08/02/19 1849 08/03/19 0037  WBC 17.4* 16.8* 17.1*  RBC 3.10* 2.97* 3.05*  HGB 8.6* 8.2* 8.4*  HCT 25.9* 24.8* 24.8*  MCV 83.5 83.5 81.3  MCH 27.7 27.6 27.5  MCHC 33.2 33.1 33.9  RDW 15.5 15.5 15.4  PLT 409* 382 387    BNP Recent Labs  Lab 07/30/19 1714  BNP 386.3*     Radiology    DG Chest 2 View  Result Date: 08/03/2019 CLINICAL DATA:  Significant atrial fibrillation. Alcoholic pancreatitis. EXAM: CHEST - 2 VIEW COMPARISON:  August 01, 2019 FINDINGS: The mass in the left upper lobe with destruction of the left posterior third rib is similar in the interval. Bilateral pulmonary opacities are diffuse and similar in the interval. No other interval changes. IMPRESSION: 1. The left upper lobe mass with destruction of the posterior left third rib is stable. 2. Diffuse bilateral pulmonary infiltrates are similar in the interval. Electronically Signed   By: Dorise Bullion III M.D   On: 08/03/2019 10:16   ECHOCARDIOGRAM COMPLETE  Result Date: 08/02/2019   ECHOCARDIOGRAM REPORT   Patient Name:   Timothy Martinez Date of Exam: 08/02/2019 Medical Rec #:  [id number]       Height:       75.0 in Accession #:    [id number]      Weight:       175.0 lb Date of Birth:  1950/11/14        BSA:          2.07 m Patient Age:    69 years        BP:           125/73 mmHg Patient Gender: M               HR:           76 bpm. Exam Location:  Inpatient Procedure: 2D Echo Indications:    Dyspnea 786.09/R06.00   History:        Patient has prior history of Echocardiogram examinations, most                 recent 12/30/2014. COPD, Arrythmias:Atrial Fibrillation; Risk                 Factors:Diabetes. 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  Sonographer:    Clayton Lefort RDCS (AE) Referring Phys: [place]  1. Technically diffcult study. Left ventricular ejection fraction, by visual estimation, is grossly 40 to 45%. The left ventricle has mild to moderately decreased function. There is mildly increased left ventricular hypertrophy. Would consider limited echo with contrast to better evaluate systolic function  2. Global right ventricle has normal systolic function.The right ventricular size is normal. No increase in right ventricular wall thickness.  3. Left atrial size was mildly dilated.  4. Right atrial size was normal.  5. The mitral valve is normal in structure. No evidence of mitral valve regurgitation.  6. The tricuspid valve is normal in structure.  7. The aortic valve is normal in structure. Aortic valve regurgitation is not visualized. No evidence of aortic valve sclerosis or stenosis.  8. The pulmonic valve was not well visualized. Pulmonic valve regurgitation is not visualized.  9. TR signal is inadequate for assessing pulmonary artery systolic pressure. 10. The inferior vena cava is dilated in size with <50% respiratory variability, suggesting right atrial pressure of 15 mmHg. FINDINGS  Left Ventricle: Left ventricular ejection fraction, by visual estimation, is 40 to 45%. The left ventricle has mild to moderately decreased function. The left ventricle demonstrates global hypokinesis. There is mildly increased left ventricular hypertrophy. Right Ventricle: The right ventricular size is normal. No increase in right ventricular wall thickness. Global RV systolic function is has normal systolic function. Left Atrium: Left atrial size was mildly dilated. Right Atrium:  Right atrial size was normal in size Pericardium: There is no evidence of pericardial effusion. Mitral Valve: The  mitral valve is normal in structure. No evidence of mitral valve regurgitation. MV peak gradient, 5.9 mmHg. Tricuspid Valve: The tricuspid valve is normal in structure. Tricuspid valve regurgitation is not demonstrated. Aortic Valve: The aortic valve is normal in structure. Aortic valve regurgitation is not visualized. The aortic valve is structurally normal, with no evidence of sclerosis or stenosis. Aortic valve mean gradient measures 3.0 mmHg. Aortic valve peak gradient measures 5.1 mmHg. Aortic valve area, by VTI measures 2.53 cm. Pulmonic Valve: The pulmonic valve was not well visualized. Pulmonic valve regurgitation is not visualized. Pulmonic regurgitation is not visualized. Aorta: The aortic root and ascending aorta are structurally normal, with no evidence of dilitation. Venous: The inferior vena cava is dilated in size with less than 50% respiratory variability, suggesting right atrial pressure of 15 mmHg. IAS/Shunts: The atrial septum is grossly normal.  LEFT VENTRICLE PLAX 2D LVIDd:         4.74 cm  Diastology LVIDs:         4.17 cm  LV e' lateral:   9.90 cm/s LV PW:         1.06 cm  LV E/e' lateral: 9.8 LV IVS:        1.08 cm  LV e' medial:    8.05 cm/s LVOT diam:     2.20 cm  LV E/e' medial:  12.0 LV SV:         27 ml LV SV Index:   13.29 LVOT Area:     3.80 cm  RIGHT VENTRICLE             IVC RV S prime:     12.80 cm/s  IVC diam: 2.47 cm TAPSE (M-mode): 2.9 cm LEFT ATRIUM           Index       RIGHT ATRIUM           Index LA diam:      3.60 cm 1.74 cm/m  RA Area:     13.90 cm LA Vol (A2C): 48.8 ml 23.54 ml/m RA Volume:   32.90 ml  15.87 ml/m LA Vol (A4C): 70.8 ml 34.15 ml/m  AORTIC VALVE AV Area (Vmax):    2.63 cm AV Area (Vmean):   2.32 cm AV Area (VTI):     2.53 cm AV Vmax:           113.00 cm/s AV Vmean:          79.800 cm/s AV VTI:            0.221 m AV Peak Grad:      5.1  mmHg AV Mean Grad:      3.0 mmHg LVOT Vmax:         78.30 cm/s LVOT Vmean:        48.700 cm/s LVOT VTI:          0.147 m LVOT/AV VTI ratio: 0.67  AORTA Ao Root diam: 3.00 cm Ao Asc diam:  3.40 cm MITRAL VALVE MV Area (PHT): 6.27 cm             SHUNTS MV Peak grad:  5.9 mmHg             Systemic VTI:  0.15 m MV Mean grad:  2.0 mmHg             Systemic Diam: 2.20 cm MV Vmax:       1.21 m/s MV Vmean:      61.8 cm/s MV VTI:  0.31 m MV PHT:        35.09 msec MV Decel Time: 121 msec MV E velocity: 96.80 cm/s 103 cm/s MV A velocity: 49.20 cm/s 70.3 cm/s MV E/A ratio:  1.97       1.5  Oswaldo Milian MD Electronically signed by Oswaldo Milian MD Signature Date/Time: 08/02/2019/8:03:44 PM    Final    US Abdomen Limited RUQ  Result Date: 08/02/2019 CLINICAL DATA:  Increasing abdominal pain EXAM: ULTRASOUND ABDOMEN LIMITED RIGHT UPPER QUADRANT COMPARISON:  CT from 07/30/2019 FINDINGS: Gallbladder: Gallbladder is significantly distended with mild wall thickening. No cholelithiasis or pericholecystic fluid is seen. Negative sonographic Murphy's sign is noted. Common bile duct: Diameter: 6 mm. Liver: No focal lesion identified. Within normal limits in parenchymal echogenicity. Portal vein is patent on color Doppler imaging with normal direction of blood flow towards the liver. Mild biliary ductal dilatation is seen consistent with the changes of chronic pancreatitis seen on prior CT. The overall appearance is stable. Other: None. IMPRESSION: Distended gallbladder and mildly the dilated intrahepatic biliary ductal system. These changes are likely related to chronic changes in the head of the pancreas from previous pancreatitis. The overall appearance is similar to that seen on the prior exam. Electronically Signed   By: Inez Catalina M.D.   On: 08/02/2019 21:56    Patient Profile     69 year old male with past medical history of paroxysmal atrial fibrillation, chronic diastolic congestive heart failure,  COPD, pancreatitis, hypertension, hyperlipidemia, diabetes mellitus, prior splenectomy now with lung mass for evaluation of atrial fibrillation/flutter.  Patient had a nuclear study December 21 that showed no scar or ischemia; ejection fraction 61%.  Patient admitted with worsening dyspnea on exertion, elevated heart rate and dizziness.  He was found to have atrial fibrillation and was sent to the hospital.  Chest CT shows large left upper lobe lung mass with chest wall invasion and rib destruction and bronchoscopy is planned.  Cardiology now asked to evaluate preoperatively and also for atrial fibrillation.    Assessment & Plan    1 atrial fibrillation with rapid ventricular response-patient remains in sinus rhythm today.  I will transition IV amiodarone to oral 400 mg twice daily for 1 week and then 200 mg daily thereafter.  Continue metoprolol at present dose.  Continue IV heparin.  Echocardiogram shows ejection fraction 40 to 45% though technically difficult.  2 preoperative evaluation prior to bronchoscopy-No CP and recent nuclear study negative.  Pt may proceed without further cardiac eval.  3 question chronic diastolic congestive heart failure-patient does not appear to be volume overloaded on examination.  We will not diurese at this point.  4 lung mass-likely primary cancer.  For bronchoscopy once cardiac status stable.  5 history of chronic pancreatitis.  For questions or updates, please contact [place] Please consult [email or web address] for contact info under        Signed, Kirk Ruths, MD  08/03/2019, 11:08 AM

## 2019-08-03 NOTE — Progress Notes (Signed)
Nutrition Follow-up  DOCUMENTATION CODES:   Not applicable  INTERVENTION:  Continue Ensure Enlive po TID, each supplement provides 350 kcal and 20 grams of protein  Continue MVI with minerals daily  Provide Magic cup with lunch and dinner meals, each supplement provides 290 kcal and 9 grams of protein   NUTRITION DIAGNOSIS:   Increased nutrient needs related to chronic illness(COPD and chronic pancreatitis) as evidenced by estimated needs.  Ongoing  GOAL:   Patient will meet greater than or equal to 90% of their needs  Progressing  MONITOR:   PO intake, Supplement acceptance, Labs, Weight trends, Skin, I & O's  REASON FOR ASSESSMENT:   Consult Assessment of nutrition requirement/status  ASSESSMENT:  RD working remotely.  69 year old with history of tobacco abuse, chronic pancreatitis, atrial fibrillation, COPD, and chronic pain on opioid therapy presenting with symptomatic hypotension and a new left upper lobe mass. Patient reports he feels tired today. Breathing is 'tight'. Endorses several months of night sweats and fatigure. Reports some shoulder and intermittent chest pains. He reports he is aware of findings on CT and is 'not ready to accept anything just yet'.  Nutrition department received counsult for assessment of nutritional status on 1/06. Patient seen remotely at that time, please refer to RD note on 1/06.   Per notes, bronch rescheduled for Monday.  Patient eating 25-75% x 2 recorded meals this admission and provided Ensure nutrition supplement 3 times daily. Patient with history of severe malnutrition and likely ongoing, however, unable to identify at this time. RD will add Magic Cup to lunch and dinner meals; NFPE to be completed at follow up to assess for fat/muscle depletions.  Patient weights have been slowly trending down over the past 6 months. Currently pt 73.6 kg (161.92 lbs), 12/04 pt 74.8 kg (164.56 lb), 10/07 pt wt 73.8 kg (162.36 lb), 8/11 pt wt  75.4 kg (165.88 lb), 7/11 pt wt 78.9 kg (173.58 lb)   Noted 11.66 lb (6.7%) wt loss in 6 months which is insignificant for time frame.   I/Os: +3448 ml since admit    -267 ml x 24 hrs UOP: 850 ml x 24 hrs  Medication and labs reviewed  Diet Order:   Diet Order            Diet NPO time specified  Diet effective midnight        Diet Heart Room service appropriate? Yes; Fluid consistency: Thin  Diet effective now              EDUCATION NEEDS:   Education needs have been addressed  Skin:  Skin Assessment: Reviewed RN Assessment  Last BM:  1/09  Height:   Ht Readings from Last 1 Encounters:  07/30/19 6\' 3"  (1.905 m)    Weight:   Wt Readings from Last 1 Encounters:  08/03/19 73.6 kg    Ideal Body Weight:  89.1 kg  BMI:  Body mass index is 20.27 kg/m.  Estimated Nutritional Needs:   Kcal:  2250-2450  Protein:  120-135 grams  Fluid:  > 2.2 L   Lajuan Lines, RD, LDN Clinical Nutrition [facility] Telephone [phone] After Hours/Weekend Pager: [phone]

## 2019-08-03 NOTE — Plan of Care (Signed)
  Problem: Clinical Measurements: Goal: Diagnostic test results will improve Outcome: Progressing   Problem: Nutrition: Goal: Adequate nutrition will be maintained Outcome: Progressing   Problem: Coping: Goal: Level of anxiety will decrease Outcome: Progressing   Problem: Elimination: Goal: Will not experience complications related to bowel motility Outcome: Completed/Met

## 2019-08-03 NOTE — Progress Notes (Addendum)
Family Medicine Teaching Service Daily Progress Note Intern Pager: [phone]  Patient name: Timothy Martinez Medical record number: [id number] Date of birth: [birth year] Age: 69 y.o. Gender: male  Primary Care Provider: [name], DO Consultants: CVTS   Code Status: Full   Pt Overview and Major Events to Date:  07/30/19: Admitted, CT Lung Mass with rib infiltration   Assessment and Plan: Timothy Martinez is a 69 y.o. male who presented dizziness and dyspnea. PMH is significant for chronic alcoholic pancreatitis, tobacco abuse, GERD, COPD, type II diabetes, AFIB, HFpEF, lumbar back pain, hyperlipidemia  A. Fibrillation, now rate controlled -Continue cardiac monitoring, pulse oximetry -metoprolol 75 mg twice daily, amioderone infusion -continue heparin - appreciate cardiology recommendations  Acute hypoxic respiratory failure secondary to Pneumonia (CAP vs. Obstructive)  Large lung mass  Satting well on 10 L nasal cannula.  Denies shortness of breath on exam.  CVTS plans bronch w/ biopsy on 01/11. WBC improved to 17. Would like to broaden abx coverage to better cover strep, but has PCN allergery (rash). Possibly start on respiratory quinolone, but have consulted pharmacy for recommendations.  - P0 doxycycline (1/7 - ) - CVTS recommendations appreciated - AM CBC with diff - Continuous pulse oximetry and telemetry  - PT / OT eval and treat   Anemia Hg stable at 8.4. Cont to trend.   AKI., resolved  Cr 0.7  Low Magnesium Mg 1.4. Replete as necessary. Goal 2.0  Chronic pancreatitis 2/2 previous ETOH excess Ab pain improved. Tolerating PO. RUQ u/s shows chronic biliary dilitation due to chronic pancreatitis. No acute findings.  Patient follow with [facility]. CT abdomen 07/30/19: Stable pancreatic ductal dilatation and multiple calcifications. Previously seen pseudocyst in head of the pancreas poorly visualized. Home meds: Ensure supplements TID between meals, Norco/Vicodin 5-325 mg  every 6 hourly, Creon 2 capsules 5 times daily, Zofran 4 mg every 8 hourly, Compazine 10 mg every 8 hourly -Continue home meds -Hep B/HepC/Hep A: neg for infection  Diabetes Mellitus Glucose 103 today, last A1c 6.1 10/31/2017 Home meds: Metformin 1 g BID -Hold Metformin, no need for sliding scale at present -Monitor CBGs daily with BMP  Left ear ulcer See media for image.  Concern for skin cancer given chronic nature of this ulceration and location on a frequently sun exposed area. Patient reports wound has been present for 2-3 months.  -Xerform dressing, per Wound RN consultation  -Dermatology outpatient referral   Hyperlipidemia Home meds: Atorvastatin 20 mg once daily -Lipid panel  -Continue statin  HFpEF EF 55-60%with G1 DD on echo in 12/2014. Echocardiogram w/ limited study shows LVEF 40-45%. Follows with heart care, last seen in 2019 without concern.  - Cardiology consulted, appreciate recommendations -Monitor fluid status   COPD Continue to monitor respiratory status. Home meds: Dulera BID  -Continue Dulera -Albuterol PRN   GERD Home meds: Pantoprazole 40mg  once daily -Continue Protinix  - Pepcid 20 mg BID   Tobacco use Smokes 1 ppd since age 78, patient has cut down from 3 ppd -14 mg nicotine patch as needed  Chronic lower back pain, chronic, stable Currently asymptomatic Home meds: hydrocodone, will continue - Scheduled Tylenol 650 mg q6h  - Norco 5mg  BID   Previous alcohol use: Stable Last drink was 1.5 years ago.  Encouraged patient to keep this up.  Hx of colonic polyps Three sessile polpys resected during colonoscopy in 2019. No evidence of malignancy   Constipation Home meds: MiraLAX, -Continue home meds  FEN/GI: Regular diet, Ensure  TID, Multivitamin PPx: Heparin gtt  Disposition: awaiting CVTS procedure on 01/11  Subjective:  Timothy Martinez denies chest pain, shortness of breath, abdominal pain  Objective: Temp:  [97.7 F (36.5  C)-97.9 F (36.6 C)] 97.7 F (36.5 C) (01/09 0801) Pulse Rate:  [65-78] 69 (01/09 0522) Resp:  [13-29] 13 (01/09 0522) BP: (117-140)/(62-76) 131/76 (01/09 0522) SpO2:  [95 %-100 %] 95 % (01/09 0522) FiO2 (%):  [60 %] 60 % (01/08 2048) Weight:  [73.6 kg] 73.6 kg (01/09 0522)   Physical Exam:  GEN: Resting comfortably in bed, eating breakfast CV: Regular rate no murmurs appreciated RESP: no increased work of breathing, clear to auscultation bilaterally ABD: Bowel sounds present. soft, nontender, nondistended MSK: no lower extremity edema, or cyanosis noted SKIN: warm, dry NEURO: grossly normal, moves all extremities appropriately  Laboratory: Recent Labs  Lab 08/02/19 1634 08/02/19 1849 08/03/19 0037  WBC 17.4* 16.8* 17.1*  HGB 8.6* 8.2* 8.4*  HCT 25.9* 24.8* 24.8*  PLT 409* 382 387   Recent Labs  Lab 08/01/19 0821 08/02/19 0345 08/03/19 0037  NA 140 136 137  K 4.0 3.6 3.3*  CL 111 107 107  CO2 19* 19* 22  BUN 17 15 18   CREATININE 0.85 0.87 0.78  CALCIUM 7.9* 8.0* 8.0*  PROT 5.1* 5.2* 4.9*  BILITOT 1.3* 1.2 0.9  ALKPHOS 229* 314* 285*  ALT 19 18 17   AST 32 28 25  GLUCOSE 115* 103* 111*      Imaging/Diagnostic Tests: DG Chest 2 View  Result Date: 08/03/2019 CLINICAL DATA:  Significant atrial fibrillation. Alcoholic pancreatitis. EXAM: CHEST - 2 VIEW COMPARISON:  August 01, 2019 FINDINGS: The mass in the left upper lobe with destruction of the left posterior third rib is similar in the interval. Bilateral pulmonary opacities are diffuse and similar in the interval. No other interval changes. IMPRESSION: 1. The left upper lobe mass with destruction of the posterior left third rib is stable. 2. Diffuse bilateral pulmonary infiltrates are similar in the interval. Electronically Signed   By: Dorise Bullion III M.D   On: 08/03/2019 10:16   ECHOCARDIOGRAM COMPLETE  Result Date: 08/02/2019   ECHOCARDIOGRAM REPORT   Patient Name:   Timothy Martinez Date of Exam:  08/02/2019 Medical Rec #:  [id number]       Height:       75.0 in Accession #:    [id number]      Weight:       175.0 lb Date of Birth:  1951-02-12        BSA:          2.07 m Patient Age:    67 years        BP:           125/73 mmHg Patient Gender: M               HR:           76 bpm. Exam Location:  Inpatient Procedure: 2D Echo Indications:    Dyspnea 786.09/R06.00  History:        Patient has prior history of Echocardiogram examinations, most                 recent 12/30/2014. COPD, Arrythmias:Atrial Fibrillation; Risk                 Factors:Diabetes. 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  Sonographer:    Clayton Lefort RDCS (AE) Referring Phys: [place]  1. Technically diffcult study. Left ventricular ejection fraction, by visual estimation, is grossly 40 to 45%. The left ventricle has mild to moderately decreased function. There is mildly increased left ventricular hypertrophy. Would consider limited echo with contrast to better evaluate systolic function  2. Global right ventricle has normal systolic function.The right ventricular size is normal. No increase in right ventricular wall thickness.  3. Left atrial size was mildly dilated.  4. Right atrial size was normal.  5. The mitral valve is normal in structure. No evidence of mitral valve regurgitation.  6. The tricuspid valve is normal in structure.  7. The aortic valve is normal in structure. Aortic valve regurgitation is not visualized. No evidence of aortic valve sclerosis or stenosis.  8. The pulmonic valve was not well visualized. Pulmonic valve regurgitation is not visualized.  9. TR signal is inadequate for assessing pulmonary artery systolic pressure. 10. The inferior vena cava is dilated in size with <50% respiratory variability, suggesting right atrial pressure of 15 mmHg. FINDINGS  Left Ventricle: Left ventricular ejection fraction, by visual estimation, is 40 to 45%. The left ventricle has mild to  moderately decreased function. The left ventricle demonstrates global hypokinesis. There is mildly increased left ventricular hypertrophy. Right Ventricle: The right ventricular size is normal. No increase in right ventricular wall thickness. Global RV systolic function is has normal systolic function. Left Atrium: Left atrial size was mildly dilated. Right Atrium: Right atrial size was normal in size Pericardium: There is no evidence of pericardial effusion. Mitral Valve: The mitral valve is normal in structure. No evidence of mitral valve regurgitation. MV peak gradient, 5.9 mmHg. Tricuspid Valve: The tricuspid valve is normal in structure. Tricuspid valve regurgitation is not demonstrated. Aortic Valve: The aortic valve is normal in structure. Aortic valve regurgitation is not visualized. The aortic valve is structurally normal, with no evidence of sclerosis or stenosis. Aortic valve mean gradient measures 3.0 mmHg. Aortic valve peak gradient measures 5.1 mmHg. Aortic valve area, by VTI measures 2.53 cm. Pulmonic Valve: The pulmonic valve was not well visualized. Pulmonic valve regurgitation is not visualized. Pulmonic regurgitation is not visualized. Aorta: The aortic root and ascending aorta are structurally normal, with no evidence of dilitation. Venous: The inferior vena cava is dilated in size with less than 50% respiratory variability, suggesting right atrial pressure of 15 mmHg. IAS/Shunts: The atrial septum is grossly normal.  LEFT VENTRICLE PLAX 2D LVIDd:         4.74 cm  Diastology LVIDs:         4.17 cm  LV e' lateral:   9.90 cm/s LV PW:         1.06 cm  LV E/e' lateral: 9.8 LV IVS:        1.08 cm  LV e' medial:    8.05 cm/s LVOT diam:     2.20 cm  LV E/e' medial:  12.0 LV SV:         27 ml LV SV Index:   13.29 LVOT Area:     3.80 cm  RIGHT VENTRICLE             IVC RV S prime:     12.80 cm/s  IVC diam: 2.47 cm TAPSE (M-mode): 2.9 cm LEFT ATRIUM           Index       RIGHT ATRIUM  Index LA  diam:      3.60 cm 1.74 cm/m  RA Area:     13.90 cm LA Vol (A2C): 48.8 ml 23.54 ml/m RA Volume:   32.90 ml  15.87 ml/m LA Vol (A4C): 70.8 ml 34.15 ml/m  AORTIC VALVE AV Area (Vmax):    2.63 cm AV Area (Vmean):   2.32 cm AV Area (VTI):     2.53 cm AV Vmax:           113.00 cm/s AV Vmean:          79.800 cm/s AV VTI:            0.221 m AV Peak Grad:      5.1 mmHg AV Mean Grad:      3.0 mmHg LVOT Vmax:         78.30 cm/s LVOT Vmean:        48.700 cm/s LVOT VTI:          0.147 m LVOT/AV VTI ratio: 0.67  AORTA Ao Root diam: 3.00 cm Ao Asc diam:  3.40 cm MITRAL VALVE MV Area (PHT): 6.27 cm             SHUNTS MV Peak grad:  5.9 mmHg             Systemic VTI:  0.15 m MV Mean grad:  2.0 mmHg             Systemic Diam: 2.20 cm MV Vmax:       1.21 m/s MV Vmean:      61.8 cm/s MV VTI:        0.31 m MV PHT:        35.09 msec MV Decel Time: 121 msec MV E velocity: 96.80 cm/s 103 cm/s MV A velocity: 49.20 cm/s 70.3 cm/s MV E/A ratio:  1.97       1.5  Oswaldo Milian MD Electronically signed by Oswaldo Milian MD Signature Date/Time: 08/02/2019/8:03:44 PM    Final    US Abdomen Limited RUQ  Result Date: 08/02/2019 CLINICAL DATA:  Increasing abdominal pain EXAM: ULTRASOUND ABDOMEN LIMITED RIGHT UPPER QUADRANT COMPARISON:  CT from 07/30/2019 FINDINGS: Gallbladder: Gallbladder is significantly distended with mild wall thickening. No cholelithiasis or pericholecystic fluid is seen. Negative sonographic Murphy's sign is noted. Common bile duct: Diameter: 6 mm. Liver: No focal lesion identified. Within normal limits in parenchymal echogenicity. Portal vein is patent on color Doppler imaging with normal direction of blood flow towards the liver. Mild biliary ductal dilatation is seen consistent with the changes of chronic pancreatitis seen on prior CT. The overall appearance is stable. Other: None. IMPRESSION: Distended gallbladder and mildly the dilated intrahepatic biliary ductal system. These changes are likely  related to chronic changes in the head of the pancreas from previous pancreatitis. The overall appearance is similar to that seen on the prior exam. Electronically Signed   By: Inez Catalina M.D.   On: 08/02/2019 21:56    Marny Lowenstein, MD, MS FAMILY MEDICINE RESIDENT - PGY3 08/03/2019 10:39 AM

## 2019-08-04 DIAGNOSIS — Z9189 Other specified personal risk factors, not elsewhere classified: Secondary | ICD-10-CM

## 2019-08-04 DIAGNOSIS — G893 Neoplasm related pain (acute) (chronic): Secondary | ICD-10-CM

## 2019-08-04 DIAGNOSIS — R627 Adult failure to thrive: Secondary | ICD-10-CM

## 2019-08-04 DIAGNOSIS — R29898 Other symptoms and signs involving the musculoskeletal system: Secondary | ICD-10-CM

## 2019-08-04 DIAGNOSIS — Z515 Encounter for palliative care: Secondary | ICD-10-CM

## 2019-08-04 DIAGNOSIS — Z66 Do not resuscitate: Secondary | ICD-10-CM

## 2019-08-04 LAB — CBC WITH DIFFERENTIAL/PLATELET
Abs Immature Granulocytes: 0.81 10*3/uL — ABNORMAL HIGH (ref 0.00–0.07)
Basophils Absolute: 0.1 10*3/uL (ref 0.0–0.1)
Basophils Relative: 0 %
Eosinophils Absolute: 0.1 10*3/uL (ref 0.0–0.5)
Eosinophils Relative: 0 %
HCT: 25.2 % — ABNORMAL LOW (ref 39.0–52.0)
Hemoglobin: 8.4 g/dL — ABNORMAL LOW (ref 13.0–17.0)
Immature Granulocytes: 4 %
Lymphocytes Relative: 10 %
Lymphs Abs: 1.8 10*3/uL (ref 0.7–4.0)
MCH: 27.5 pg (ref 26.0–34.0)
MCHC: 33.3 g/dL (ref 30.0–36.0)
MCV: 82.4 fL (ref 80.0–100.0)
Monocytes Absolute: 2.2 10*3/uL — ABNORMAL HIGH (ref 0.1–1.0)
Monocytes Relative: 12 %
Neutro Abs: 13.3 10*3/uL — ABNORMAL HIGH (ref 1.7–7.7)
Neutrophils Relative %: 74 %
Platelets: 363 10*3/uL (ref 150–400)
RBC: 3.06 MIL/uL — ABNORMAL LOW (ref 4.22–5.81)
RDW: 15.4 % (ref 11.5–15.5)
WBC: 18.3 10*3/uL — ABNORMAL HIGH (ref 4.0–10.5)
nRBC: 2.3 % — ABNORMAL HIGH (ref 0.0–0.2)

## 2019-08-04 LAB — COMPREHENSIVE METABOLIC PANEL
ALT: 19 U/L (ref 0–44)
AST: 40 U/L (ref 15–41)
Albumin: 1.4 g/dL — ABNORMAL LOW (ref 3.5–5.0)
Alkaline Phosphatase: 326 U/L — ABNORMAL HIGH (ref 38–126)
Anion gap: 8 (ref 5–15)
BUN: 16 mg/dL (ref 8–23)
CO2: 26 mmol/L (ref 22–32)
Calcium: 8.1 mg/dL — ABNORMAL LOW (ref 8.9–10.3)
Chloride: 105 mmol/L (ref 98–111)
Creatinine, Ser: 0.75 mg/dL (ref 0.61–1.24)
GFR calc Af Amer: >60 mL/min (ref >60)
GFR calc non Af Amer: >60 mL/min (ref >60)
Glucose, Bld: 177 mg/dL — ABNORMAL HIGH (ref 70–99)
Potassium: 3.5 mmol/L (ref 3.5–5.1)
Sodium: 139 mmol/L (ref 135–145)
Total Bilirubin: 0.6 mg/dL (ref 0.3–1.2)
Total Protein: 5.2 g/dL — ABNORMAL LOW (ref 6.5–8.1)

## 2019-08-04 LAB — HEPARIN LEVEL (UNFRACTIONATED)
Heparin Unfractionated: 0.1 IU/mL — ABNORMAL LOW (ref 0.30–0.70)
Heparin Unfractionated: 0.16 IU/mL — ABNORMAL LOW (ref 0.30–0.70)
Heparin Unfractionated: 0.17 IU/mL — ABNORMAL LOW (ref 0.30–0.70)
Heparin Unfractionated: 0.52 IU/mL (ref 0.30–0.70)

## 2019-08-04 LAB — GLUCOSE, CAPILLARY: Glucose-Capillary: 95 mg/dL (ref 70–99)

## 2019-08-04 MED ORDER — MIRTAZAPINE 7.5 MG PO TABS
7.5000 mg | ORAL_TABLET | Freq: Every day | ORAL | Status: DC
  Administered 2019-08-04 – 2019-08-08 (×5): 7.5 mg via ORAL
  Filled 2019-08-04 (×5): qty 1

## 2019-08-04 MED ORDER — HEPARIN BOLUS VIA INFUSION
2000.0000 [IU] | Freq: Once | INTRAVENOUS | Status: AC
  Administered 2019-08-04: 2000 [IU] via INTRAVENOUS
  Filled 2019-08-04: qty 2000

## 2019-08-04 MED ORDER — OXYCODONE HCL 5 MG PO TABS
5.0000 mg | ORAL_TABLET | Freq: Four times a day (QID) | ORAL | Status: DC
  Administered 2019-08-04 – 2019-08-05 (×3): 5 mg via ORAL
  Filled 2019-08-04 (×3): qty 1

## 2019-08-04 MED ORDER — OXYCODONE HCL 5 MG PO TABS: 2.5000 mg | ORAL_TABLET | ORAL | Status: DC | PRN

## 2019-08-04 NOTE — Anesthesia Preprocedure Evaluation (Addendum)
Anesthesia Evaluation  Patient identified by MRN, date of birth, ID band Patient awake    Reviewed: Allergy & Precautions, NPO status , Patient's Chart, lab work & pertinent test results  Airway Mallampati: II  TM Distance: >3 FB Neck ROM: Full    Dental  (+) Edentulous Upper, Edentulous Lower   Pulmonary pneumonia, unresolved, COPD,  COPD inhaler, Current Smoker and Patient abstained from smoking.,   Coarse bilateral breath sounds  - rhonchi (-) decreased breath sounds      Cardiovascular hypertension, Pt. on home beta blockers + angina with exertion +CHF  + dysrhythmias Atrial Fibrillation  Rhythm:Irregular Rate:Normal  ECG: a-flutter  ECHO: 1. Technically diffcult study. Left ventricular ejection fraction, by visual estimation, is grossly 40 to 45%. The left ventricle has mild to moderately decreased function. There is mildly increased left ventricular hypertrophy. Would consider limited echo with contrast to better evaluate systolic function 2. Global right ventricle has normal systolic function.The right ventricular size is normal. No increase in right ventricular wall thickness. 3. Left atrial size was mildly dilated. 4. Right atrial size was normal. 5. The mitral valve is normal in structure. No evidence of mitral valve regurgitation. 6. The tricuspid valve is normal in structure. 7. The aortic valve is normal in structure. Aortic valve regurgitation is not visualized. No evidence of aortic valve sclerosis or stenosis. 8. The pulmonic valve was not well visualized. Pulmonic valve regurgitation is not visualized. 9. TR signal is inadequate for assessing pulmonary artery systolic pressure. 10. The inferior vena cava is dilated in size with <50% respiratory variability, suggesting right atrial pressure of 15 mmHg.  Myocardial Nuclear stress EF: 61%. There was no ST segment deviation noted during stress. The study is  normal. This is a low risk study. The left ventricular ejection fraction is normal (55-65%). No prior study for comparison.   Neuro/Psych  Headaches, PSYCHIATRIC DISORDERS Anxiety Depression    GI/Hepatic PUD, GERD  Medicated and Controlled,(+)     substance abuse  , Alcoholic pancreatitis   Endo/Other  diabetes, Oral Hypoglycemic Agents  Renal/GU negative Renal ROS     Musculoskeletal Low back pain   Abdominal   Peds  Hematology  (+) anemia , HLD   Anesthesia Other Findings LEFT UPPER LOBE MASS  Reproductive/Obstetrics                           Anesthesia Physical Anesthesia Plan  ASA: IV  Anesthesia Plan: General   Post-op Pain Management:    Induction: Intravenous  PONV Risk Score and Plan: 1 and Ondansetron, Dexamethasone and Treatment may vary due to age or medical condition  Airway Management Planned: Oral ETT  Additional Equipment:   Intra-op Plan:   Post-operative Plan: Possible Post-op intubation/ventilation  Informed Consent: I have reviewed the patients History and Physical, chart, labs and discussed the procedure including the risks, benefits and alternatives for the proposed anesthesia with the patient or authorized representative who has indicated his/her understanding and acceptance.       Plan Discussed with: CRNA  Anesthesia Plan Comments: (Patient on non-rebreather face mask in holding area )       Anesthesia Quick Evaluation

## 2019-08-04 NOTE — Progress Notes (Signed)
Progress Note  Patient Name: Timothy Martinez Date of Encounter: 08/04/2019  Primary Cardiologist: Mertie Moores, MD   Subjective   Pt denies CP or dyspnea  Inpatient Medications    Scheduled Meds:  acetaminophen  650 mg Oral Q6H   amiodarone  400 mg Oral BID   atorvastatin  20 mg Oral q1800   famotidine  20 mg Oral BID   feeding supplement (ENSURE ENLIVE)  237 mL Oral TID BM   gabapentin  100 mg Oral TID   levofloxacin  750 mg Oral Daily   lipase/protease/amylase  72,000 Units Oral TID WC   metoprolol tartrate  75 mg Oral BID   mometasone-formoterol  2 puff Inhalation BID   multivitamin with minerals  1 tablet Oral Daily   pantoprazole  40 mg Oral Daily   polyethylene glycol  17 g Oral Daily   Continuous Infusions:  heparin 2,050 Units/hr (08/04/19 0051)   PRN Meds: amitriptyline, levalbuterol, lipase/protease/amylase, metoprolol tartrate, nicotine, nitroGLYCERIN   Vital Signs    Vitals:   08/04/19 0100 08/04/19 0628 08/04/19 0744 08/04/19 0810  BP:  121/68  128/63  Pulse: 69 72  65  Resp: (!) 21 (!) 21  15  Temp:  98.1 F (36.7 C)  98 F (36.7 C)  TempSrc:  Oral  Oral  SpO2: 98% 98% 100% 99%  Weight:  76.3 kg    Height:        Intake/Output Summary (Last 24 hours) at 08/04/2019 0811 Last data filed at 08/04/2019 8466 Gross per 24 hour  Intake 1762.38 ml  Output 1850 ml  Net -87.62 ml   Last 3 Weights 08/04/2019 08/03/2019 08/01/2019  Weight (lbs) 168 lb 3.2 oz 162 lb 3.2 oz 175 lb  Weight (kg) 76.295 kg 73.573 kg 79.379 kg      Telemetry    Sinus- Personally Reviewed   Physical Exam   GEN: NAD WD Neck: supple Cardiac: RRR Respiratory: Diminished BS throughout; no rhonchi GI: Soft, NT, ND, no masses MS: No edema Neuro:  No focal findings Psych: Normal affect   Labs    High Sensitivity Troponin:   Recent Labs  Lab 07/30/19 1719 07/30/19 1924  TROPONINIHS 12 12      Chemistry Recent Labs  Lab 08/02/19 0345  08/03/19 0037 08/04/19 0007  NA 136 137 139  K 3.6 3.3* 3.5  CL 107 107 105  CO2 19* 22 26  GLUCOSE 103* 111* 177*  BUN 15 18 16   CREATININE 0.87 0.78 0.75  CALCIUM 8.0* 8.0* 8.1*  PROT 5.2* 4.9* 5.2*  ALBUMIN 1.6* 1.5* 1.4*  AST 28 25 40  ALT 18 17 19   ALKPHOS 314* 285* 326*  BILITOT 1.2 0.9 0.6  GFRNONAA >60 >60 >60  GFRAA >60 >60 >60  ANIONGAP 10 8 8      Hematology Recent Labs  Lab 08/02/19 1849 08/03/19 0037 08/03/19 1633  WBC 16.8* 17.1* 18.3*  RBC 2.97* 3.05* 3.40*  HGB 8.2* 8.4* 9.4*  HCT 24.8* 24.8* 27.7*  MCV 83.5 81.3 81.5  MCH 27.6 27.5 27.6  MCHC 33.1 33.9 33.9  RDW 15.5 15.4 15.2  PLT 382 387 402*    BNP Recent Labs  Lab 07/30/19 1714  BNP 386.3*     Radiology    DG Chest 2 View  Result Date: 08/03/2019 CLINICAL DATA:  Significant atrial fibrillation. Alcoholic pancreatitis. EXAM: CHEST - 2 VIEW COMPARISON:  August 01, 2019 FINDINGS: The mass in the left upper lobe with destruction of the  left posterior third rib is similar in the interval. Bilateral pulmonary opacities are diffuse and similar in the interval. No other interval changes. IMPRESSION: 1. The left upper lobe mass with destruction of the posterior left third rib is stable. 2. Diffuse bilateral pulmonary infiltrates are similar in the interval. Electronically Signed   By: Dorise Bullion III M.D   On: 08/03/2019 10:16   ECHOCARDIOGRAM COMPLETE  Result Date: 08/02/2019   ECHOCARDIOGRAM REPORT   Patient Name:   Timothy Martinez Date of Exam: 08/02/2019 Medical Rec #:  [id number]       Height:       75.0 in Accession #:    [id number]      Weight:       175.0 lb Date of Birth:  [birth year]        BSA:          2.07 m Patient Age:    69 years        BP:           125/73 mmHg Patient Gender: M               HR:           76 bpm. Exam Location:  Inpatient Procedure: 2D Echo Indications:    Dyspnea 786.09/R06.00  History:        Patient has prior history of Echocardiogram examinations, most                  recent 12/30/2014. COPD, Arrythmias:Atrial Fibrillation; Risk                 Factors:Diabetes. 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  Sonographer:    Clayton Lefort RDCS (AE) Referring Phys: [place]  1. Technically diffcult study. Left ventricular ejection fraction, by visual estimation, is grossly 40 to 45%. The left ventricle has mild to moderately decreased function. There is mildly increased left ventricular hypertrophy. Would consider limited echo with contrast to better evaluate systolic function  2. Global right ventricle has normal systolic function.The right ventricular size is normal. No increase in right ventricular wall thickness.  3. Left atrial size was mildly dilated.  4. Right atrial size was normal.  5. The mitral valve is normal in structure. No evidence of mitral valve regurgitation.  6. The tricuspid valve is normal in structure.  7. The aortic valve is normal in structure. Aortic valve regurgitation is not visualized. No evidence of aortic valve sclerosis or stenosis.  8. The pulmonic valve was not well visualized. Pulmonic valve regurgitation is not visualized.  9. TR signal is inadequate for assessing pulmonary artery systolic pressure. 10. The inferior vena cava is dilated in size with <50% respiratory variability, suggesting right atrial pressure of 15 mmHg. FINDINGS  Left Ventricle: Left ventricular ejection fraction, by visual estimation, is 40 to 45%. The left ventricle has mild to moderately decreased function. The left ventricle demonstrates global hypokinesis. There is mildly increased left ventricular hypertrophy. Right Ventricle: The right ventricular size is normal. No increase in right ventricular wall thickness. Global RV systolic function is has normal systolic function. Left Atrium: Left atrial size was mildly dilated. Right Atrium: Right atrial size was normal in size Pericardium: There is no evidence of pericardial effusion.  Mitral Valve: The mitral valve is normal in structure. No evidence of mitral valve regurgitation. MV peak gradient,  5.9 mmHg. Tricuspid Valve: The tricuspid valve is normal in structure. Tricuspid valve regurgitation is not demonstrated. Aortic Valve: The aortic valve is normal in structure. Aortic valve regurgitation is not visualized. The aortic valve is structurally normal, with no evidence of sclerosis or stenosis. Aortic valve mean gradient measures 3.0 mmHg. Aortic valve peak gradient measures 5.1 mmHg. Aortic valve area, by VTI measures 2.53 cm. Pulmonic Valve: The pulmonic valve was not well visualized. Pulmonic valve regurgitation is not visualized. Pulmonic regurgitation is not visualized. Aorta: The aortic root and ascending aorta are structurally normal, with no evidence of dilitation. Venous: The inferior vena cava is dilated in size with less than 50% respiratory variability, suggesting right atrial pressure of 15 mmHg. IAS/Shunts: The atrial septum is grossly normal.  LEFT VENTRICLE PLAX 2D LVIDd:         4.74 cm  Diastology LVIDs:         4.17 cm  LV e' lateral:   9.90 cm/s LV PW:         1.06 cm  LV E/e' lateral: 9.8 LV IVS:        1.08 cm  LV e' medial:    8.05 cm/s LVOT diam:     2.20 cm  LV E/e' medial:  12.0 LV SV:         27 ml LV SV Index:   13.29 LVOT Area:     3.80 cm  RIGHT VENTRICLE             IVC RV S prime:     12.80 cm/s  IVC diam: 2.47 cm TAPSE (M-mode): 2.9 cm LEFT ATRIUM           Index       RIGHT ATRIUM           Index LA diam:      3.60 cm 1.74 cm/m  RA Area:     13.90 cm LA Vol (A2C): 48.8 ml 23.54 ml/m RA Volume:   32.90 ml  15.87 ml/m LA Vol (A4C): 70.8 ml 34.15 ml/m  AORTIC VALVE AV Area (Vmax):    2.63 cm AV Area (Vmean):   2.32 cm AV Area (VTI):     2.53 cm AV Vmax:           113.00 cm/s AV Vmean:          79.800 cm/s AV VTI:            0.221 m AV Peak Grad:      5.1 mmHg AV Mean Grad:      3.0 mmHg LVOT Vmax:         78.30 cm/s LVOT Vmean:        48.700 cm/s LVOT  VTI:          0.147 m LVOT/AV VTI ratio: 0.67  AORTA Ao Root diam: 3.00 cm Ao Asc diam:  3.40 cm MITRAL VALVE MV Area (PHT): 6.27 cm             SHUNTS MV Peak grad:  5.9 mmHg             Systemic VTI:  0.15 m MV Mean grad:  2.0 mmHg             Systemic Diam: 2.20 cm MV Vmax:       1.21 m/s MV Vmean:      61.8 cm/s MV VTI:        0.31 m MV PHT:  35.09 msec MV Decel Time: 121 msec MV E velocity: 96.80 cm/s 103 cm/s MV A velocity: 49.20 cm/s 70.3 cm/s MV E/A ratio:  1.97       1.5  Oswaldo Milian MD Electronically signed by Oswaldo Milian MD Signature Date/Time: 08/02/2019/8:03:44 PM    Final    US Abdomen Limited RUQ  Result Date: 08/02/2019 CLINICAL DATA:  Increasing abdominal pain EXAM: ULTRASOUND ABDOMEN LIMITED RIGHT UPPER QUADRANT COMPARISON:  CT from 07/30/2019 FINDINGS: Gallbladder: Gallbladder is significantly distended with mild wall thickening. No cholelithiasis or pericholecystic fluid is seen. Negative sonographic Murphy's sign is noted. Common bile duct: Diameter: 6 mm. Liver: No focal lesion identified. Within normal limits in parenchymal echogenicity. Portal vein is patent on color Doppler imaging with normal direction of blood flow towards the liver. Mild biliary ductal dilatation is seen consistent with the changes of chronic pancreatitis seen on prior CT. The overall appearance is stable. Other: None. IMPRESSION: Distended gallbladder and mildly the dilated intrahepatic biliary ductal system. These changes are likely related to chronic changes in the head of the pancreas from previous pancreatitis. The overall appearance is similar to that seen on the prior exam. Electronically Signed   By: Inez Catalina M.D.   On: 08/02/2019 21:56    Patient Profile     69 year old male with past medical history of paroxysmal atrial fibrillation, chronic diastolic congestive heart failure, COPD, pancreatitis, hypertension, hyperlipidemia, diabetes mellitus, prior splenectomy now with lung  mass for evaluation of atrial fibrillation/flutter.  Patient had a nuclear study December 21 that showed no scar or ischemia; ejection fraction 61%.  Patient admitted with worsening dyspnea on exertion, elevated heart rate and dizziness.  He was found to have atrial fibrillation and was sent to the hospital.  Chest CT shows large left upper lobe lung mass with chest wall invasion and rib destruction and bronchoscopy is planned.  Cardiology now asked to evaluate preoperatively and also for atrial fibrillation.    Assessment & Plan    1 atrial fibrillation with rapid ventricular response-patient remains in sinus today.  Continue amiodarone 400 mg twice daily for 1 week and then change to 200 mg daily thereafter.  Continue metoprolol at present dose.  Continue IV heparin.  Transition to oral anticoagulation when all procedures complete.  Echocardiogram shows ejection fraction 40 to 45% though technically difficult.  2 preoperative evaluation prior to bronchoscopy-No CP and recent nuclear study negative.  Pt may proceed without further cardiac eval.  3 question chronic diastolic congestive heart failure-patient is not volume overloaded.  We will not diurese.  4 lung mass-likely primary cancer.  For bronchoscopy once cardiac status stable.  5 history of chronic pancreatitis.  For questions or updates, please contact [place] Please consult [email or web address] for contact info under        Signed, Kirk Ruths, MD  08/04/2019, 8:11 AM

## 2019-08-04 NOTE — Consult Note (Addendum)
Consultation Note Date: 08/04/2019   Patient Name: Timothy Martinez  DOB: [birth year]  MRN: [id number]  Age / Sex: 69 y.o., male  PCP: [name], DO Referring Physician: Kinnie Feil, MD  Reason for Consultation: Establishing goals of care  HPI/Patient Profile:  Timothy Martinez is a 69 y.o. male presenting with dizziness and dyspnea. PMH is significant for chronic alcoholic pancreatitis, tobacco abuse, GERD, COPD, type II diabetes, A. Fib, HFpEF, lumbar back pain, hyperlipidemia.  Clinical Assessment and Goals of Care: Reviewed chart. Spoke to bedside RN, Marya Amsler. Met with Mr. Brue this morning. We talked about his hospital stay thus far. Introduced myself and Palliative medicine team ideals. Vibhav vocalized that he felt confused about what was going on with him. During the time I was with him Dr. Gwendlyn Deutscher came in and explained that he has a pneumonia which is being treated. She discussed his lung mass which we are waiting on broncoscopy for then hopefully after pathology results we will be able to identify possible treatment options. Siddarth stated that he understood that.   Asked Hymen to give me an idea of what his day to day life looked like prior to hospitalization. He shared that He lives in an apartment by himself in [place]. He was able to attend to all of his basic care needs. 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 He is a religious man.  Discussed patients wishes moving forward. We both agreed that these would be hard to determine until he understood more about present diagnosis. Went over his code status, he shared that he would not want CPR or intubation as he does not feel that would lead to an improvement in his qualify of life. Stated that he cannot stand  the amount of constant pain he is. He says on average he is in 8-10/10 pain throughout his body though more recently in his chest. We talked about where his lung mass is and how this is likely a contributor to the amount of pain he is experiencing. Discussed different medication options with him, he stated that he just wants to get to a place of comfort and until then he is inhibited by pain. He states that he feels weak, this is not new and is worsening. We discussed a plan to control his pain to hopefully help him to a point whereby he feels well enough to get up and get moving again. He shares that he has had a poor appetite, he overall just does not feel hungry.  Called patients daughter [name] per his request. [place] and I's conversation this morning. She is thankful for all of the medical care. She agrees with everything being done for him at the present time. We talked more about Palliative care and Miltons feelings as a whole. Discussed Palliative care and how it can often be a bridge to hospice  care which [name] understands well as her mother passed away of ovarian cancer on hospice.  Both Olga Millers and [place] agree to wait and see what the biopsy reveals. Miquan is explicit  stating that he would not wish to be going aggressively back and fourth to the hospital if he should be given treatment options that required that. He did say though that these feelings may improve once his pain is under control.  The Palliative care team will remain involved to help treat symptoms and offer patient support.  Addendum: Spoke to patient later in the day, he stated that he thought about things and would like to be full code.  Decisions Maker: Patient is of sound mind to make decisions for himself Sarita Bottom (Daughter) [phone] would help make decisions if patient is unable to advocate for himself  SUMMARY OF RECOMMENDATIONS   Pursue better pain control as below Full Code Chaplain  Consult Social work Consult OP Palliative consult Code Status/Advance Care Planning:  Full   Symptom Management:   Pain, multifactorial though more likely r/t lung mass in LUL causing rib destruction:  Oxycodone 53m PO Q6H ATC  Oxycodone 2.545mPO Q4H PRN breakthrough pain  Gabapentin 10033mO TID  Continue tylenol 650m79m Q6H  If pain is unrelenting could consider a trial of dexamethasone  Muscular Deconditioning:  Physical therapy  Occupational Therapy  Poor Oral Intake:  Encourage satiating foods  adding mirtazepine as this could aid helping in appetite, insomnia for mood effect would need titration   LUL Lung Mass:  Plan for bronchoscopy for tissue sample  Oncology onboard   Insomnia:  Will DC amytriptylline as he has not been receiving it  trial mirtazepine at 7.5mg 12mQHS--> at lower doses can produce more of a soporific effect  Spiritual Support:  Chaplain Consultation  Social:  [place] likely have increase in need for assistance. Patients daughter, SherrJudeen Hammansasked for help finding him a place closer to her [place]  [place] consents to outpatient Palliative care enrollment and follow up  Palliative Prophylaxis:   Aspiration, Bowel Regimen, Delirium Protocol, Eye Care, Frequent Pain Assessment, Oral Care and Turn Reposition  Additional Recommendations (Limitations, Scope, Preferences):  Full Scope Treatment  Psycho-social/Spiritual:   Desire for further Chaplaincy support:yes  Additional Recommendations: Caregiving  Support/Resources  Prognosis:   Will depending upon lung biopsy  Discharge Planning:  Home with OP Palliative Care follow up    Primary Diagnoses: Present on Admission: . Mass of upper lobe of left lung . Lung mass  I have reviewed the medical record, interviewed the patient and family, and examined the patient. The following aspects are pertinent.  Past Medical History:    Diagnosis Date  . Acute on chronic pancreatitis (HCC) [place]24/2017  . Alcohol abuse   . Alcoholic pancreatitis   . Anginal pain (HCC) [place] Anxiety and depression 01/20/2015   PHQ 9 = 11 (01/20/15)    . Arthritis    "eat up w/it" (05/17/2016)  . Atrial fibrillation status post cardioversion (HCC) [place]016  . Atrial flutter, unspecified   . CHF (congestive heart failure) (HCC) [place] Chronic anticoagulation   . Chronic atrial fibrillation (HCC) [place] Chronic bronchitis (HCC) [place] Chronic left-sided low back pain with sciatica 12/21/2012  . Chronic pancreatitis (HCC) [place] COLD (chronic obstructive lung disease) (HCC) [place] Complication of anesthesia    pt C/O confusion after procedure in April  2020  . Daily headache    "need eye examined" (05/17/2016)  . Gastric outlet obstruction 10/2018  . GERD (gastroesophageal reflux disease)   . Hematuria, microscopic 02/13/2015   Noted on UA - Recheck UA at next visit ~ 1 months   . Hyperlipidemia   . Hypertension   . Insomnia 02/11/2016  . Lateral epicondylitis of right elbow 03/17/2017  . Left foot pain 10/12/2013  . Loss of weight 02/19/2018  . Low back pain 12/21/2012  . Malnutrition of moderate degree 05/18/2016  . Neck pain, bilateral posterior 10/15/2014  . Neuropathy 03/16/2015  . Pain of joint of left ankle and foot 06/13/2018  . Pain of right upper extremity 05/30/2016  . Pancreatic pseudocyst    seen on CT scan 07/2014  . Pancreatitis 08/04/2014  . Permanent atrial fibrillation ([place]) 05/05/2017  . Pneumonia    "?a few times" (05/17/2016)  . 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 Tobacco abuse 05/30/2011   Heavy smoker up to 3 ppd down to 15 cigs per day in 09/2014   . Type II diabetes mellitus ([place])   . Wears dentures   . Wears glasses    Social History   Socioeconomic History  . Marital status: Single    Spouse name: Not on file  . Number of children: 2  . Years of education: Not on file  . Highest education level: Not on file  Occupational History   . Occupation: retired  Tobacco Use  . Smoking status: Current Every Day Smoker    Packs/day: 0.50    Years: 52.00    Pack years: 26.00    Types: Cigarettes    Start date: 04/09/1964  . Smokeless tobacco: Never Used  . Tobacco comment: down to one pack daily  Substance and Sexual Activity  . Alcohol use: Not Currently    Comment: QUIT IN 02/2018  . Drug use: No  . Sexual activity: Not Currently    Birth control/protection: Condom  Other Topics Concern  . 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     2 children one is Radio producer in transplant center   Single or divorced   Admits to alcohol abuse in past- no longer drinking- last drink 02/2018    Smoker   No drugs   Social Determinants of Health   Financial Resource Strain:   . Difficulty of Paying Living Expenses: Not on file  Food Insecurity:   . Worried About Charity fundraiser in the Last Year: Not on file  . Ran Out of Food in the Last Year: Not on file  Transportation Needs:   . Lack of Transportation (Medical): Not on file  . Lack of Transportation (Non-Medical): Not on file  Physical Activity:   . Days of Exercise per Week: Not on file  . Minutes of Exercise per Session: Not on file  Stress:   . Feeling of Stress : Not on file  Social Connections:   . Frequency of Communication with Friends and Family: Not on file  . Frequency of Social Gatherings with Friends and Family: Not on file  . Attends Religious Services: Not on file  . Active Member of Clubs or Organizations: Not on file  . Attends Archivist Meetings: Not on file  . Marital Status: Not on file   Family History  Problem Relation Age of Onset  . Diabetes Mother   .  Aneurysm Mother   . Stroke Mother   . Hypertension Mother   . Alcohol abuse Father   . Cancer Father        Lung  . Cancer Sister        Lung Cancer  . Post-traumatic stress disorder Brother    . Heart attack Neg Hx   . Colon cancer Neg Hx   . Liver cancer Neg Hx   . Rectal cancer Neg Hx   . Esophageal cancer Neg Hx   . Stomach cancer Neg Hx   . Inflammatory bowel disease Neg Hx    Scheduled Meds: . acetaminophen  650 mg Oral Q6H  . amiodarone  400 mg Oral BID  . atorvastatin  20 mg Oral q1800  . famotidine  20 mg Oral BID  . feeding supplement (ENSURE ENLIVE)  237 mL Oral TID BM  . gabapentin  100 mg Oral TID  . levofloxacin  750 mg Oral Daily  . lipase/protease/amylase  72,000 Units Oral TID WC  . metoprolol tartrate  75 mg Oral BID  . mometasone-formoterol  2 puff Inhalation BID  . multivitamin with minerals  1 tablet Oral Daily  . pantoprazole  40 mg Oral Daily  . polyethylene glycol  17 g Oral Daily   Continuous Infusions: . heparin 2,300 Units/hr (08/04/19 0826)   PRN Meds:.amitriptyline, levalbuterol, lipase/protease/amylase, metoprolol tartrate, nicotine, nitroGLYCERIN Medications Prior to Admission:  Prior to Admission medications   Medication Sig Start Date End Date Taking? Authorizing Provider  amitriptyline (ELAVIL) 10 MG tablet TAKE 1 TABLET BY MOUTH EVERYDAY AT BEDTIME Patient taking differently: Take 10 mg by mouth at bedtime.  02/18/19  Yes Lockamy, Timothy, DO  atorvastatin (LIPITOR) 40 MG tablet Take 0.5 tablets (20 mg total) by mouth daily at 6 PM. Patient taking differently: Take 40 mg by mouth daily at 6 PM.  11/23/18  Yes Beard, Samantha N, DO  famotidine (PEPCID) 20 MG tablet TAKE 1 TABLET BY MOUTH TWICE A DAY Patient taking differently: Take 20 mg by mouth every morning.  04/05/19  Yes Lockamy, Christia Reading, DO  feeding supplement, ENSURE ENLIVE, (ENSURE ENLIVE) LIQD Take 237 mLs by mouth 3 (three) times daily between meals. 11/23/18  Yes Beard, Samantha N, DO  gabapentin (NEURONTIN) 100 MG capsule TAKE 1 CAPSULE BY MOUTH THREE TIMES A DAY Patient taking differently: Take 100 mg by mouth 2 (two) times daily.  03/05/19  Yes [name], DO   HYDROcodone-acetaminophen (NORCO) 7.5-325 MG tablet Take 1 tablet by mouth 3 (three) times daily.  05/27/19  Yes [provider]  lipase/protease/amylase (CREON) 36000 UNITS CPEP capsule Take 36,000-72,000 capsules by mouth See admin instructions. 2 caps with each meal, 1 capsule with snacks 01/28/19  Yes [provider]  metFORMIN (GLUCOPHAGE) 1000 MG tablet Take 1 tablet (1,000 mg total) by mouth 2 (two) times daily with a meal. 03/05/19  Yes Lockamy, Timothy, DO  metoprolol tartrate (LOPRESSOR) 50 MG tablet Take 1.5 tablets (75 mg total) by mouth 2 (two) times daily. 05/15/19  Yes Lockamy, Timothy, DO  mometasone-formoterol (DULERA) 200-5 MCG/ACT AERO Inhale 2 puffs into the lungs 2 (two) times daily. 09/27/18  Yes [name], DO  Multiple Vitamin (MULTIVITAMIN WITH MINERALS) TABS tablet Take 1 tablet by mouth daily. 11/24/18  Yes Darrelyn Hillock N, DO  naproxen sodium (ALEVE) 220 MG tablet Take 880 mg by mouth 2 (two) times daily as needed (pain).   Yes [provider]  nitroGLYCERIN (NITROSTAT) 0.4 MG SL tablet Place 1  tablet (0.4 mg total) under the tongue every 5 (five) minutes as needed for chest pain. 03/23/18  Yes Meccariello, Bernita Raisin, DO  ondansetron (ZOFRAN ODT) 4 MG disintegrating tablet Take 1 tablet (4 mg total) by mouth every 8 (eight) hours as needed for nausea or vomiting. 12/22/18  Yes Albrizze, Kaitlyn E, PA-C  polyethylene glycol (MIRALAX / GLYCOLAX) 17 g packet TAKE 17 G BY MOUTH DAILY. Patient taking differently: Take 17 g by mouth daily as needed for moderate constipation.  11/14/18  Yes Lockamy, Christia Reading, DO  prochlorperazine (COMPAZINE) 10 MG tablet Take 1 tablet (10 mg total) by mouth every 8 (eight) hours as needed for nausea or vomiting. 01/01/19  Yes Mansouraty, Telford Nab., MD  tamsulosin (FLOMAX) 0.4 MG CAPS capsule Take 1 capsule (0.4 mg total) by mouth daily. 06/12/19  Yes Lockamy, Timothy, DO  XARELTO 20 MG TABS tablet TAKE 1 TABLET BY MOUTH DAILY  WITH SUPPER Patient taking differently: Take 20 mg by mouth daily with supper.  02/27/19  Yes Lockamy, Timothy, DO  Blood Glucose Monitoring Suppl (ONE TOUCH ULTRA 2) w/Device KIT Dispense one glucometer. Diagnosis Type 2 Diabetes. E11.9 06/13/18   Rory Percy, DO  glucose blood (ONE TOUCH ULTRA TEST) test strip Use check fasting blood glucose qAM. Type 2 Diabetes E11.9 10/01/18   [name], DO  Lancets (FREESTYLE) lancets Use as instructed 10/02/18   [name], DO  Lancets (ONETOUCH ULTRASOFT) lancets Use as instructed 10/02/18   [name], DO  pantoprazole (PROTONIX) 40 MG tablet Take 1 tablet (40 mg total) by mouth daily. Patient not taking: Reported on 07/31/2019 10/01/18   [name], DO  famotidine (PEPCID) 20 MG tablet Take 1 tablet (20 mg total) by mouth 2 (two) times daily. 03/27/19   [name], DO   Allergies  Allergen Reactions  . Prozac [Fluoxetine Hcl] Nausea Only    Made the patient very sick to his stomach  . Morphine And Related Swelling    Pt states he can tolerate oxycodone and hydromorphone  . Penicillins Rash    Has patient had a PCN reaction causing immediate rash, facial/tongue/throat swelling, SOB or lightheadedness with hypotension: Yes Has patient had a PCN reaction causing severe rash involving mucus membranes or skin necrosis: No Has patient had a PCN reaction that required hospitalization: No Has patient had a PCN reaction occurring within the last 10 years: No If all of the above answers are "NO", then may proceed with Cephalosporin use.    Review of Systems  Constitutional: Positive for activity change, appetite change and fatigue.  HENT: Negative.   Eyes: Negative.   Respiratory: Positive for cough and shortness of breath.   Gastrointestinal: Positive for abdominal distention.  Endocrine: Negative.   Genitourinary: Negative.   Musculoskeletal: Positive for arthralgias and back pain.  Skin: Negative.   Allergic/Immunologic:  Negative.   Neurological: Positive for weakness.  Hematological: Negative.   Psychiatric/Behavioral: The patient is nervous/anxious.     Physical Exam Vitals and nursing note reviewed.  HENT:     Head: Normocephalic.     Nose: Nose normal.     Mouth/Throat:     Mouth: Mucous membranes are dry.  Eyes:     Pupils: Pupils are equal, round, and reactive to light.  Cardiovascular:     Rate and Rhythm: Rhythm irregular.  Pulmonary:     Effort: Pulmonary effort is normal.  Musculoskeletal:     Cervical back: Normal range of motion.  Neurological:     Mental  Status: He is alert.    Vital Signs: BP 128/63 (BP Location: Right Arm)   Pulse 65   Temp 98 F (36.7 C) (Oral)   Resp 15   Ht _0  (1.905 m)   Wt 76.3 kg   SpO2 99%   BMI 21.02 kg/m  Pain Scale: 0-10   Pain Score: 4   SpO2: SpO2: 99 % O2 Device:SpO2: 99 % O2 Flow Rate: .O2 Flow Rate (L/min): 10 L/min  IO: Intake/output summary:   Intake/Output Summary (Last 24 hours) at 08/04/2019 0908 Last data filed at 08/04/2019 1747 Gross per 24 hour  Intake 1762.38 ml  Output 1550 ml  Net 212.38 ml   LBM: Last BM Date: 08/03/19 Baseline Weight: Weight: 74.4 kg Most recent weight: Weight: 76.3 kg     Palliative Assessment/Data: 50%   Time In: 0700 Time Out: 0815 Time Total: 75 Greater than 50%  of this time was spent counseling and coordinating care related to the above assessment and plan.  Signed by: Rosezella Rumpf, NP   Please contact Palliative Medicine Team phone at [phone] for questions and concerns.  For individual provider: See Shea Evans

## 2019-08-04 NOTE — Progress Notes (Signed)
Family Medicine Teaching Service Daily Progress Note Intern Pager: [phone]  Patient name: Timothy Martinez Medical record number: [id number] Date of birth: [birth year] Age: 69 y.o. Gender: male  Primary Care Provider: [name], DO Consultants: CVTS   Code Status: Full   Pt Overview and Major Events to Date:  07/30/19: Admitted, CT Lung Mass with rib infiltration   Assessment and Plan: Timothy Martinez is a 69 y.o. male who presented dizziness and dyspnea. PMH is significant for chronic alcoholic pancreatitis, tobacco abuse, GERD, COPD, type II diabetes, AFIB, HFpEF, lumbar back pain, hyperlipidemia    Acute hypoxic respiratory failure  Pneumonia (CAP vs. Obstructive)  Large lung mass  Satting well on 10L HFNC. WBC 18.3, uptrending yesterday. Bronchoscopy tomorrow. Per palliative, patient changed to DNR status. Patient says he feels a lot better today. Productive cough continues.  - CVTS consulted appreciate recommendations: bronch 1/11  - palliative following, appreciate recommendations: continue palliative consult outpatient  - Doxycycline (1/7 - 1/9) - Levaquin 750 mg  (1/9 - ) - Oxycodone 5mg  q6h   - AM CBC w/ diff  - Continuous cardiac monitoring and pulse oximetry  - PT/OT eval and treat    A. Fibrillation, now rate controlled HR well controlled (66-76) with oral amiodarone 400mg  BID (for one week and then daily thereafter) and home  metoprolol 75 mg BID.  - Cardiology following, appreciate recommendations  -Continue cardiac monitoring, pulse oximetry -metoprolol 75 mg twice daily -Amiodorone 400 mg BID  (1/9 -  1/15)  -2.5 mg IV Metoprolol PRN  for sustained HR >120  -Continue heparin, transition of oral DOAC once procedures complete   Anemia Hg stable at 9.4. Cont to trend.  - Daily CBC   AKI., resolved  Cr 0.7 - Monitor AM CMP   Low Magnesium Mg 1.4. Replete as necessary. Goal 2.0 - follow up AM Mg    Chronic pancreatitis 2/2 previous ETOH  excess Chronic abdominal pain unchanged. RUQ u/s shows chronic biliary dilitation due to chronic pancreatitis. No acute findings.  Patient follow with [facility]. CT abdomen 07/30/19: Stable pancreatic ductal dilatation and multiple calcifications. Previously seen pseudocyst in head of the pancreas poorly visualized. Home meds: Ensure supplements TID between meals, Norco/Vicodin 5-325 mg every 6 hourly, Creon 2 capsules 5 times daily, Zofran 4 mg every 8 hourly, Compazine 10 mg every 8 hourly -Continue home meds -Hep B/HepC/Hep A: neg for infection  Diabetes Mellitus Glucose 177 today, last A1c 6.1 10/31/2017 Home meds: Metformin 1 g BID -Hold Metformin, no need for sliding scale at present -Monitor CBGs daily with BMP  Left ear ulcer See media for image.  Concern for skin cancer given chronic nature of this ulceration and location on a frequently sun exposed area. Patient reports wound has been present for 2-3 months.  -Xerform dressing, per Wound RN consultation  -Dermatology outpatient referral   Hyperlipidemia Home meds: Atorvastatin 20 mg once daily -Lipid panel  -Continue statin  HFpEF  Echocardiogram w/ limited study shows LVEF 40-45%. Follows with heart care, last seen in 2019 without concern. EF 55-60%with G1 DD on echo in 12/2014. - Cardiology following, appreciate recommendations -Monitor fluid status   COPD Continue to monitor respiratory status. Home meds: Dulera BID  -Continue Dulera -Albuterol PRN   GERD Home meds: Pantoprazole 40mg  once daily -Continue Protinix  - Pepcid 20 mg BID   Tobacco use Smokes 1 ppd since age 82, patient has cut down from 3 ppd -14 mg nicotine patch as needed  Chronic lower back pain, chronic, stable Currently asymptomatic Home meds: hydrocodone, will continue - Scheduled Tylenol 650 mg q6h  - Norco 5mg  BID   Previous alcohol use: Stable Last drink was 1.5 years ago.  Encouraged patient to keep this up.  Hx of colonic  polyps Three sessile polpys resected during colonoscopy in 2019. No evidence of malignancy   Constipation Home meds: MiraLAX, -Continue home meds  FEN/GI: Regular diet, Ensure TID, Multivitamin PPx: Heparin gtt  Disposition: awaiting CVTS procedure on 01/11  Subjective:  Timothy Martinez had no significant overnight events.   Objective: Temp:  [97.5 F (36.4 C)-98.1 F (36.7 C)] 98 F (36.7 C) (01/10 0810) Pulse Rate:  [65-76] 65 (01/10 0810) Resp:  [15-27] 15 (01/10 0810) BP: (114-128)/(58-68) 128/63 (01/10 0810) SpO2:  [95 %-100 %] 99 % (01/10 0810) Weight:  [76.3 kg] 76.3 kg (01/10 7371)   Physical Exam: GEN: pleasant male in no acute distress, resting comfortably in bed  CV: regular rate and rhythm, no murmurs appreciated RESP: no increased work of breathing, rhonchi clear with cough, no rales, no wheezing ABD: Bowel sounds present. Soft, Non-tender, Non-distended MSK: no lower extremity edema, or cyanosis noted SKIN: warm, dry NEURO: grossly normal, moves all extremities appropriately     Laboratory: Recent Labs  Lab 08/02/19 1849 08/03/19 0037 08/03/19 1633  WBC 16.8* 17.1* 18.3*  HGB 8.2* 8.4* 9.4*  HCT 24.8* 24.8* 27.7*  PLT 382 387 402*   Recent Labs  Lab 08/02/19 0345 08/03/19 0037 08/04/19 0007  NA 136 137 139  K 3.6 3.3* 3.5  CL 107 107 105  CO2 19* 22 26  BUN 15 18 16   CREATININE 0.87 0.78 0.75  CALCIUM 8.0* 8.0* 8.1*  PROT 5.2* 4.9* 5.2*  BILITOT 1.2 0.9 0.6  ALKPHOS 314* 285* 326*  ALT 18 17 19   AST 28 25 40  GLUCOSE 103* 111* 177*      Imaging/Diagnostic Tests: No results found.  [name], DO PGY-1, [facility] 08/04/2019 8:35 AM

## 2019-08-04 NOTE — Progress Notes (Signed)
ANTICOAGULATION CONSULT NOTE  Pharmacy Consult for Heparin Indication: atrial fibrillation  Patient Measurements: Height: 6\' 3"  (190.5 cm) Weight: 168 lb 3.2 oz (76.3 kg) IBW/kg (Calculated) : 84.5 Heparin Dosing Weight: 74.4 kg  Vital Signs: Temp: 98.1 F (36.7 C) (01/10 0628) Temp Source: Oral (01/10 0628) BP: 121/68 (01/10 0628) Pulse Rate: 72 (01/10 0628)  Labs: Recent Labs    08/01/19 0821 08/01/19 1806 08/02/19 0345 08/02/19 3570 08/02/19 1634 08/02/19 1849 08/03/19 0037 08/03/19 1633 08/03/19 2343 08/04/19 0007 08/04/19 0653  HGB  --  8.2*  --    < >   < > 8.2* 8.4* 9.4*  --   --   --   HCT  --  25.6*  --    < >  --  24.8* 24.8* 27.7*  --   --   --   PLT  --  407*  --    < >  --  382 387 402*  --   --   --   APTT  --  71*  --   --   --   --   --   --   --   --   --   LABPROT  --   --   --   --   --   --  16.8*  --   --   --   --   INR  --   --   --   --   --   --  1.4*  --   --   --   --   HEPARINUNFRC   < > 0.55  --   --   --   --  0.16*  --  0.16* 0.10* 0.17*  CREATININE  --   --  0.87  --   --   --  0.78  --   --  0.75  --    < > = values in this interval not displayed.    Estimated Creatinine Clearance: 96.7 mL/min (by C-G formula based on SCr of 0.75 mg/dL).  Assessment: Timothy Martinez is a 54 YOM presenting with dizziness and dyspnea. PMH significant for atrial fibrillation on Xarelto PTA, alcoholic pancreatitis, GERD, COPD, T2DM, HF and HLD. CHADsVASc 4 (age, HTN, DM, HF). Last dose of Xarelto was on 07/29/2019.  Heparin level remains subtherapeutic at 0.17 after a heparin infusion rate increase and a bolus of 2000 units last night. Per nursing, no infusion issues. His H&H has been low but stable, last on 1/9 at 9.4/27.7. plts slightly elevated at 402.    Goal of Therapy:  Heparin level 0.3-0.7 units/ml Monitor platelets by anticoagulation protocol: Yes   Plan:  Bolus heparin infusion with 2000 units Increase Heparin IV drip to 2300 units/hr Recheck heparin  level in 6 hrs. Daily heparin level and CBC. Follow up when to resume oral anticoagulation given lack of therapeutic heparin levels     Thank you,   Eddie Candle, PharmD PGY-1 Pharmacy Resident   Please check amion for clinical pharmacist contact number  08/04/2019 8:02 AM

## 2019-08-04 NOTE — Progress Notes (Signed)
ANTICOAGULATION CONSULT NOTE  Pharmacy Consult for Heparin Indication: atrial fibrillation  Patient Measurements: Height: 6\' 3"  (190.5 cm) Weight: 168 lb 3.2 oz (76.3 kg) IBW/kg (Calculated) : 84.5 Heparin Dosing Weight: 74.4 kg  Vital Signs: Temp: 98 F (36.7 C) (01/10 0810) Temp Source: Oral (01/10 0810) BP: 128/63 (01/10 0810) Pulse Rate: 71 (01/10 1500)  Labs: Recent Labs    08/01/19 1806 08/02/19 0345 08/02/19 0808 08/03/19 0037 08/03/19 1633 08/04/19 0007 08/04/19 0653 08/04/19 0934 08/04/19 1508  HGB 8.2*  --    < > 8.4* 9.4*  --   --  8.4*  --   HCT 25.6*  --    < > 24.8* 27.7*  --   --  25.2*  --   PLT 407*  --    < > 387 402*  --   --  363  --   APTT 71*  --   --   --   --   --   --   --   --   LABPROT  --   --   --  16.8*  --   --   --   --   --   INR  --   --   --  1.4*  --   --   --   --   --   HEPARINUNFRC 0.55  --   --  0.16*  --  0.10* 0.17*  --  0.52  CREATININE  --  0.87  --  0.78  --  0.75  --   --   --    < > = values in this interval not displayed.    Estimated Creatinine Clearance: 96.7 mL/min (by C-G formula based on SCr of 0.75 mg/dL).  Assessment: Timothy Martinez is a 53 YOM presenting with dizziness and dyspnea. PMH significant for atrial fibrillation on Xarelto PTA, alcoholic pancreatitis, GERD, COPD, T2DM, HF and HLD. CHADsVASc 4 (age, HTN, DM, HF). Last dose of Xarelto was on 07/29/2019.  Heparin level remains subtherapeutic at 0.17 after a heparin infusion rate increase and a bolus of 2000 units last night. Per nursing, no infusion issues. His H&H has been low but stable, last on 1/9 at 9.4/27.7. plts slightly elevated at 402.   PM follow up, heparin level now at goal.  No overt bleeding or complications noted.  Goal of Therapy:  Heparin level 0.3-0.7 units/ml Monitor platelets by anticoagulation protocol: Yes   Plan:  Continue IV heparin at current rate. Confirm heparin level in 8 hrs.  Marguerite Olea, [facility] Clinical  Pharmacist Phone [phone]  08/04/2019 4:40 PM

## 2019-08-05 ENCOUNTER — Inpatient Hospital Stay (HOSPITAL_COMMUNITY): Payer: Medicare Other | Admitting: Anesthesiology

## 2019-08-05 ENCOUNTER — Inpatient Hospital Stay (HOSPITAL_COMMUNITY): Payer: Medicare Other

## 2019-08-05 ENCOUNTER — Encounter (HOSPITAL_COMMUNITY)
Admission: EM | Disposition: A | Discharge status: Home Health | Payer: Self-pay | Source: Home / Self Care | Attending: Family Medicine

## 2019-08-05 DIAGNOSIS — R222 Localized swelling, mass and lump, trunk: Secondary | ICD-10-CM

## 2019-08-05 HISTORY — PX: VIDEO BRONCHOSCOPY WITH ENDOBRONCHIAL ULTRASOUND: SHX6177

## 2019-08-05 HISTORY — PX: VIDEO BRONCHOSCOPY WITH RADIAL ENDOBRONCHIAL ULTRASOUND: SHX6849

## 2019-08-05 LAB — CBC WITH DIFFERENTIAL/PLATELET
Abs Immature Granulocytes: 0.78 10*3/uL — ABNORMAL HIGH (ref 0.00–0.07)
Basophils Absolute: 0.1 10*3/uL (ref 0.0–0.1)
Basophils Relative: 1 %
Eosinophils Absolute: 0.2 10*3/uL (ref 0.0–0.5)
Eosinophils Relative: 1 %
HCT: 24.7 % — ABNORMAL LOW (ref 39.0–52.0)
Hemoglobin: 8.4 g/dL — ABNORMAL LOW (ref 13.0–17.0)
Immature Granulocytes: 5 %
Lymphocytes Relative: 14 %
Lymphs Abs: 2.4 10*3/uL (ref 0.7–4.0)
MCH: 27.9 pg (ref 26.0–34.0)
MCHC: 34 g/dL (ref 30.0–36.0)
MCV: 82.1 fL (ref 80.0–100.0)
Monocytes Absolute: 2.4 10*3/uL — ABNORMAL HIGH (ref 0.1–1.0)
Monocytes Relative: 14 %
Neutro Abs: 11.4 10*3/uL — ABNORMAL HIGH (ref 1.7–7.7)
Neutrophils Relative %: 65 %
Platelets: 347 10*3/uL (ref 150–400)
RBC: 3.01 MIL/uL — ABNORMAL LOW (ref 4.22–5.81)
RDW: 15.5 % (ref 11.5–15.5)
WBC: 17.2 10*3/uL — ABNORMAL HIGH (ref 4.0–10.5)
nRBC: 2.4 % — ABNORMAL HIGH (ref 0.0–0.2)

## 2019-08-05 LAB — COMPREHENSIVE METABOLIC PANEL
ALT: 18 U/L (ref 0–44)
AST: 31 U/L (ref 15–41)
Albumin: 1.4 g/dL — ABNORMAL LOW (ref 3.5–5.0)
Alkaline Phosphatase: 244 U/L — ABNORMAL HIGH (ref 38–126)
Anion gap: 7 (ref 5–15)
BUN: 13 mg/dL (ref 8–23)
CO2: 30 mmol/L (ref 22–32)
Calcium: 8.2 mg/dL — ABNORMAL LOW (ref 8.9–10.3)
Chloride: 104 mmol/L (ref 98–111)
Creatinine, Ser: 0.81 mg/dL (ref 0.61–1.24)
GFR calc Af Amer: >60 mL/min (ref >60)
GFR calc non Af Amer: >60 mL/min (ref >60)
Glucose, Bld: 106 mg/dL — ABNORMAL HIGH (ref 70–99)
Potassium: 4.3 mmol/L (ref 3.5–5.1)
Sodium: 141 mmol/L (ref 135–145)
Total Bilirubin: 0.7 mg/dL (ref 0.3–1.2)
Total Protein: 5.5 g/dL — ABNORMAL LOW (ref 6.5–8.1)

## 2019-08-05 LAB — GLUCOSE, CAPILLARY: Glucose-Capillary: 114 mg/dL — ABNORMAL HIGH (ref 70–99)

## 2019-08-05 LAB — MAGNESIUM: Magnesium: 1.7 mg/dL (ref 1.7–2.4)

## 2019-08-05 LAB — HEPARIN LEVEL (UNFRACTIONATED): Heparin Unfractionated: 0.42 IU/mL (ref 0.30–0.70)

## 2019-08-05 LAB — SURGICAL PCR SCREEN
MRSA, PCR: NEGATIVE
Staphylococcus aureus: NEGATIVE

## 2019-08-05 SURGERY — BRONCHOSCOPY, WITH EBUS
Anesthesia: General

## 2019-08-05 MED ORDER — SENNOSIDES-DOCUSATE SODIUM 8.6-50 MG PO TABS
2.0000 | ORAL_TABLET | Freq: Every day | ORAL | Status: DC
  Administered 2019-08-05 – 2019-08-08 (×4): 2 via ORAL
  Filled 2019-08-05 (×4): qty 2

## 2019-08-05 MED ORDER — ROCURONIUM BROMIDE 10 MG/ML (PF) SYRINGE
PREFILLED_SYRINGE | INTRAVENOUS | Status: AC
  Filled 2019-08-05: qty 10

## 2019-08-05 MED ORDER — MIDAZOLAM HCL 2 MG/2ML IJ SOLN
INTRAMUSCULAR | Status: AC
  Filled 2019-08-05: qty 2

## 2019-08-05 MED ORDER — PROPOFOL 10 MG/ML IV BOLUS
INTRAVENOUS | Status: DC | PRN
  Administered 2019-08-05: 200 mg via INTRAVENOUS

## 2019-08-05 MED ORDER — ONDANSETRON HCL 4 MG/2ML IJ SOLN
INTRAMUSCULAR | Status: AC
  Filled 2019-08-05: qty 2

## 2019-08-05 MED ORDER — SUGAMMADEX SODIUM 200 MG/2ML IV SOLN
INTRAVENOUS | Status: DC | PRN
  Administered 2019-08-05: 200 mg via INTRAVENOUS

## 2019-08-05 MED ORDER — ONDANSETRON HCL 4 MG/2ML IJ SOLN: 4.0000 mg | Freq: Once | INTRAMUSCULAR | Status: DC | PRN

## 2019-08-05 MED ORDER — FENTANYL CITRATE (PF) 250 MCG/5ML IJ SOLN
INTRAMUSCULAR | Status: AC
  Filled 2019-08-05: qty 5

## 2019-08-05 MED ORDER — EPINEPHRINE PF 1 MG/ML IJ SOLN
INTRAMUSCULAR | Status: DC | PRN
  Administered 2019-08-05: 1 mg

## 2019-08-05 MED ORDER — ROCURONIUM BROMIDE 50 MG/5ML IV SOSY
PREFILLED_SYRINGE | INTRAVENOUS | Status: DC | PRN
  Administered 2019-08-05: 20 mg via INTRAVENOUS
  Administered 2019-08-05: 40 mg via INTRAVENOUS
  Administered 2019-08-05: 20 mg via INTRAVENOUS

## 2019-08-05 MED ORDER — ONDANSETRON HCL 4 MG/2ML IJ SOLN
INTRAMUSCULAR | Status: DC | PRN
  Administered 2019-08-05: 4 mg via INTRAVENOUS

## 2019-08-05 MED ORDER — MUPIROCIN 2 % EX OINT
TOPICAL_OINTMENT | Freq: Two times a day (BID) | CUTANEOUS | Status: DC
  Administered 2019-08-05 – 2019-08-09 (×3): 1 via NASAL
  Filled 2019-08-05 (×2): qty 22

## 2019-08-05 MED ORDER — LIDOCAINE 2% (20 MG/ML) 5 ML SYRINGE
INTRAMUSCULAR | Status: AC
  Filled 2019-08-05: qty 5

## 2019-08-05 MED ORDER — PROPOFOL 10 MG/ML IV BOLUS
INTRAVENOUS | Status: AC
  Filled 2019-08-05: qty 40

## 2019-08-05 MED ORDER — SENNOSIDES-DOCUSATE SODIUM 8.6-50 MG PO TABS: 1.0000 | ORAL_TABLET | Freq: Every day | ORAL | Status: DC

## 2019-08-05 MED ORDER — LIDOCAINE 2% (20 MG/ML) 5 ML SYRINGE
INTRAMUSCULAR | Status: DC | PRN
  Administered 2019-08-05: 60 mg via INTRAVENOUS

## 2019-08-05 MED ORDER — 0.9 % SODIUM CHLORIDE (POUR BTL) OPTIME
TOPICAL | Status: DC | PRN
  Administered 2019-08-05: 1000 mL

## 2019-08-05 MED ORDER — FENTANYL CITRATE (PF) 100 MCG/2ML IJ SOLN: 25.0000 ug | INTRAMUSCULAR | Status: DC | PRN

## 2019-08-05 MED ORDER — CEFAZOLIN SODIUM-DEXTROSE 1-4 GM/50ML-% IV SOLN
INTRAVENOUS | Status: DC | PRN
  Administered 2019-08-05: 1 g via INTRAVENOUS

## 2019-08-05 MED ORDER — PHENYLEPHRINE HCL-NACL 10-0.9 MG/250ML-% IV SOLN
INTRAVENOUS | Status: DC | PRN
  Administered 2019-08-05: 15 ug/min via INTRAVENOUS

## 2019-08-05 MED ORDER — OXYCODONE HCL 5 MG PO TABS
5.0000 mg | ORAL_TABLET | ORAL | Status: DC | PRN
  Filled 2019-08-05: qty 1

## 2019-08-05 MED ORDER — DEXAMETHASONE SODIUM PHOSPHATE 10 MG/ML IJ SOLN
INTRAMUSCULAR | Status: AC
  Filled 2019-08-05: qty 1

## 2019-08-05 MED ORDER — LACTATED RINGERS IV SOLN: INTRAVENOUS | Status: DC | PRN

## 2019-08-05 MED ORDER — EPINEPHRINE PF 1 MG/ML IJ SOLN
INTRAMUSCULAR | Status: AC
  Filled 2019-08-05: qty 1

## 2019-08-05 MED ORDER — DEXAMETHASONE SODIUM PHOSPHATE 10 MG/ML IJ SOLN
INTRAMUSCULAR | Status: DC | PRN
  Administered 2019-08-05: 10 mg via INTRAVENOUS

## 2019-08-05 MED ORDER — FENTANYL CITRATE (PF) 250 MCG/5ML IJ SOLN
INTRAMUSCULAR | Status: DC | PRN
  Administered 2019-08-05: 100 ug via INTRAVENOUS

## 2019-08-05 MED ORDER — OXYCODONE HCL 5 MG PO TABS
5.0000 mg | ORAL_TABLET | Freq: Four times a day (QID) | ORAL | Status: DC
  Administered 2019-08-05 – 2019-08-09 (×15): 5 mg via ORAL
  Filled 2019-08-05 (×15): qty 1

## 2019-08-05 MED ORDER — HEPARIN (PORCINE) 25000 UT/250ML-% IV SOLN: 2300.0000 [IU]/h | INTRAVENOUS | Status: DC

## 2019-08-05 SURGICAL SUPPLY — 47 items
ADAPTER BRONCHOSCOPE OLYMP 190 (ADAPTER) ×1 IMPLANT
ADAPTER BRONCHOSCOPE OLYMPUS (ADAPTER) ×3 IMPLANT
ADAPTER VALVE BIOPSY EBUS (MISCELLANEOUS) IMPLANT
ADPR BSCP OLMPS EDG (ADAPTER) ×2
ADPR BSCP STRL LF REUSE (ADAPTER) ×2
ADPTR VALVE BIOPSY EBUS (MISCELLANEOUS)
BLADE CLIPPER SURG (BLADE) ×3 IMPLANT
BRUSH BIOPSY BRONCH 10 SDTNB (MISCELLANEOUS) IMPLANT
BRUSH CYTOL CELLEBRITY 1.5X140 (MISCELLANEOUS) ×2 IMPLANT
BRUSH SUPERTRAX BIOPSY (INSTRUMENTS) ×1 IMPLANT
BRUSH SUPERTRAX NDL-TIP CYTO (INSTRUMENTS) ×4 IMPLANT
CANISTER SUCT 3000ML PPV (MISCELLANEOUS) ×3 IMPLANT
CONT SPEC 4OZ CLIKSEAL STRL BL (MISCELLANEOUS) ×6 IMPLANT
COVER BACK TABLE 60X90IN (DRAPES) ×3 IMPLANT
FILTER STRAW FLUID ASPIR (MISCELLANEOUS) IMPLANT
FORCEPS BIOP SUPERTRX PREMAR (INSTRUMENTS) IMPLANT
GAUZE SPONGE 4X4 12PLY STRL (GAUZE/BANDAGES/DRESSINGS) ×3 IMPLANT
GLOVE SURG SIGNA 7.5 PF LTX (GLOVE) ×3 IMPLANT
GOWN STRL REUS W/ TWL XL LVL3 (GOWN DISPOSABLE) ×2 IMPLANT
GOWN STRL REUS W/TWL XL LVL3 (GOWN DISPOSABLE) ×3
KIT CLEAN ENDO COMPLIANCE (KITS) ×5 IMPLANT
KIT GUIDESHEATH 2.0 (SHEATH) ×1 IMPLANT
KIT ILLUMISITE 180 PROCEDURE (KITS) ×1 IMPLANT
KIT ILLUMISITE 90 PROCEDURE (KITS) IMPLANT
KIT TURNOVER KIT B (KITS) ×3 IMPLANT
MARKER SKIN DUAL TIP RULER LAB (MISCELLANEOUS) ×2 IMPLANT
NDL ASPIRATION VIZISHOT 21G (NEEDLE) IMPLANT
NDL SUPERTRX PREMARK BIOPSY (NEEDLE) IMPLANT
NEEDLE ASPIRATION VIZISHOT 21G (NEEDLE) ×3 IMPLANT
NEEDLE SUPERTRX PREMARK BIOPSY (NEEDLE) ×3 IMPLANT
NS IRRIG 1000ML POUR BTL (IV SOLUTION) ×3 IMPLANT
OIL SILICONE PENTAX (PARTS (SERVICE/REPAIRS)) ×3 IMPLANT
PAD ARMBOARD 7.5X6 YLW CONV (MISCELLANEOUS) ×6 IMPLANT
PATCHES PATIENT (LABEL) ×9 IMPLANT
SYR 20ML ECCENTRIC (SYRINGE) ×4 IMPLANT
SYR 20ML LL LF (SYRINGE) ×3 IMPLANT
SYR 30ML LL (SYRINGE) ×3 IMPLANT
SYR 5ML LL (SYRINGE) ×3 IMPLANT
TOWEL GREEN STERILE (TOWEL DISPOSABLE) ×3 IMPLANT
TOWEL GREEN STERILE FF (TOWEL DISPOSABLE) ×3 IMPLANT
TRAP SPECIMEN MUCOUS 40CC (MISCELLANEOUS) ×3 IMPLANT
TUBE CONNECTING 20X1/4 (TUBING) ×6 IMPLANT
UNDERPAD 30X30 (UNDERPADS AND DIAPERS) ×3 IMPLANT
VALVE BIOPSY  SINGLE USE (MISCELLANEOUS) ×1
VALVE BIOPSY SINGLE USE (MISCELLANEOUS) ×2 IMPLANT
VALVE SUCTION BRONCHIO DISP (MISCELLANEOUS) ×3 IMPLANT
WATER STERILE IRR 1000ML POUR (IV SOLUTION) ×3 IMPLANT

## 2019-08-05 NOTE — Progress Notes (Addendum)
FPTS Interim Progress Note  S:Patient seen after his procedure. States that he is in pain but was just given some pain medication. Reports no bowel movement in a few days.  Daughter is present and has no questions or concerns at this time.   O: BP 113/69 (BP Location: Right Arm)   Pulse 84   Temp 98 F (36.7 C) (Oral)   Resp (!) 25   Ht 6\' 3"  (1.905 m)   Wt 71.9 kg   SpO2 94%   BMI 19.81 kg/m    GEN: pleasant elderly male in no acute distress, conversing with his daughter  CV: regular rate and rhythm, no murmurs appreciated RESP: rhonchi diffusely, no rales, no wheezes  ABD: Bowel sounds active, generalized tenderness at baseline, soft, non-distended  MSK: no lower extremity edema, or cyanosis noted SKIN: warm, dry PSYCH: Normal affect and thought content   A/P: Satting well on 8L.  Will continue to wean as tolerated.  Continue Levaquin for obstructive pneumonia. Will add senna/docusate to miralax for bowel ppx.   [name], DO 08/05/2019, 4:54 PM PGY-1, [facility] Service pager [phone]

## 2019-08-05 NOTE — Progress Notes (Addendum)
Family Medicine Teaching Service Daily Progress Note Intern Pager: [phone]  Patient name: Timothy Martinez Medical record number: [id number] Date of birth: 04/21/1951 Age: 69 y.o. Gender: male  Primary Care Provider: [name], DO Consultants: CVTS   Code Status: Full   Pt Overview and Major Events to Date:  07/30/19: Admitted, CT Lung Mass with rib infiltration   Assessment and Plan: Timothy Martinez is a 69 y.o. male who presented dizziness and dyspnea. PMH is significant for chronic alcoholic pancreatitis, tobacco abuse, GERD, COPD, type II diabetes, AFIB, HFpEF, lumbar back pain, hyperlipidemia    Acute hypoxic respiratory failure  Pneumonia (CAP vs. Obstructive)  Large lung mass  Patient of the floor for his procedure. Will follow up with CVTS recommendations. Weaned to 8L HFNC overnight. Will see patient when he is back on the floor.  - CVTS consulted appreciate recommendations: bronch 1/11  - palliative following, appreciate recommendations: continue palliative consult outpatient  - Doxycycline (1/7 - 1/9) - Levaquin 750 mg  (1/9 - ) - Oxycodone 5mg  q6h   - AM CBC w/ diff  - Continuous cardiac monitoring and pulse oximetry  - PT/OT eval and treat    A. Fibrillation, now rate controlled HR well controlled (65-79) with oral amiodarone 400mg  BID (for one week and then daily thereafter) and home  metoprolol 75 mg BID.  - Cardiology following, appreciate recommendations  -Continue cardiac monitoring, pulse oximetry -metoprolol 75 mg twice daily -Amiodorone 400 mg BID  (1/9 -  1/15)  -2.5 mg IV Metoprolol PRN  for sustained HR >120  -Continue heparin, transition of oral DOAC once procedures complete   Anemia Hg stable at 8.4. Cont to trend.  - Daily CBC   AKI., resolved  - Monitor AM CMP   Low Magnesium Mg 1.7. Replete as necessary. Goal 2.0 - follow up AM Mg    Chronic pancreatitis 2/2 previous ETOH excess Chronic abdominal pain unchanged. RUQ u/s shows  chronic biliary dilitation due to chronic pancreatitis. No acute findings.  Patient follow with [facility]. CT abdomen 07/30/19: Stable pancreatic ductal dilatation and multiple calcifications. Previously seen pseudocyst in head of the pancreas poorly visualized. Home meds: Ensure supplements TID between meals, Norco/Vicodin 5-325 mg every 6 hourly, Creon 2 capsules 5 times daily, Zofran 4 mg every 8 hourly, Compazine 10 mg every 8 hourly -Continue home meds -Hep B/HepC/Hep A: neg for infection  Diabetes Mellitus Glucose 106  today, last A1c 6.1 10/31/2017 Home meds: Metformin 1 g BID -Hold Metformin, no need for sliding scale at present -Monitor CBGs daily with BMP  Left ear ulcer See media for image.  Concern for skin cancer given chronic nature of this ulceration and location on a frequently sun exposed area. Patient reports wound has been present for 2-3 months.  -Xerform dressing, per Wound RN consultation  -Dermatology outpatient referral   Hyperlipidemia Home meds: Atorvastatin 20 mg once daily -Lipid panel  -Continue statin  HFpEF  Echocardiogram w/ limited study shows LVEF 40-45%. Follows with heart care, last seen in 2019 without concern. EF 55-60%with G1 DD on echo in 12/2014. - Cardiology following, appreciate recommendations -Monitor fluid status   COPD Continue to monitor respiratory status. Home meds: Dulera BID  -Continue Dulera -Albuterol PRN   GERD Home meds: Pantoprazole 40mg  once daily -Continue Protinix  - Pepcid 20 mg BID   Tobacco use Smokes 1 ppd since age 83, patient has cut down from 3 ppd -14 mg nicotine patch as needed  Chronic lower  back pain, chronic, stable Currently asymptomatic Home meds: hydrocodone, will continue - Scheduled Tylenol 650 mg q6h  - Norco 5mg  BID   Previous alcohol use: Stable Last drink was 1.5 years ago.  Encouraged patient to keep this up.  Hx of colonic polyps Three sessile polpys resected during colonoscopy in  2019. No evidence of malignancy   Constipation Home meds: MiraLAX, -Continue home meds  FEN/GI: Regular diet, Ensure TID, Multivitamin PPx: Heparin gtt  Disposition: awaiting CVTS procedure on 01/11  Subjective:  Timothy Martinez had no significant overnight events.   Objective: Temp:  [99 F (37.2 C)-99.2 F (37.3 C)] 99 F (37.2 C) (01/11 0513) Pulse Rate:  [71-79] 77 (01/11 0513) Resp:  [16-30] 30 (01/11 0513) BP: (110-125)/(59-64) 110/64 (01/11 0513) SpO2:  [94 %-98 %] 94 % (01/11 0513) Weight:  [71.9 kg] 71.9 kg (01/11 0513)   Physical Exam:  Out for procedure. Will examine later in the day.      Laboratory: Recent Labs  Lab 08/03/19 1633 08/04/19 0934 08/05/19 0354  WBC 18.3* 18.3* 17.2*  HGB 9.4* 8.4* 8.4*  HCT 27.7* 25.2* 24.7*  PLT 402* 363 347   Recent Labs  Lab 08/03/19 0037 08/04/19 0007 08/05/19 0354  NA 137 139 141  K 3.3* 3.5 4.3  CL 107 105 104  CO2 22 26 30   BUN 18 16 13   CREATININE 0.78 0.75 0.81  CALCIUM 8.0* 8.1* 8.2*  PROT 4.9* 5.2* 5.5*  BILITOT 0.9 0.6 0.7  ALKPHOS 285* 326* 244*  ALT 17 19 18   AST 25 40 31  GLUCOSE 111* 177* 106*      Imaging/Diagnostic Tests: No results found.  [name], DO PGY-1, [facility] 08/05/2019 9:28 AM

## 2019-08-05 NOTE — Transfer of Care (Signed)
Immediate Anesthesia Transfer of Care Note  Patient: Timothy Martinez  Procedure(s) Performed: Video Bronchoscopy With Endobronchial Ultrasound (N/A ) Video Bronchoscopy With Radial Endobronchial Ultrasound  Patient Location: PACU  Anesthesia Type:General  Level of Consciousness: awake  Airway & Oxygen Therapy: Patient Spontanous Breathing and non-rebreather face mask  Post-op Assessment: Report given to RN and Post -op Vital signs reviewed and stable  Post vital signs: Reviewed and stable  Last Vitals:  Vitals Value Taken Time  BP 108/88 08/05/19 1232  Temp    Pulse 88 08/05/19 1234  Resp 24 08/05/19 1228  SpO2 100 % 08/05/19 1234  Vitals shown include unvalidated device data.  Last Pain:  Vitals:   08/05/19 0513  TempSrc: Oral  PainSc:       Patients Stated Pain Goal: 0 (86/28/24 1753)  Complications: No apparent anesthesia complications

## 2019-08-05 NOTE — Progress Notes (Signed)
Pt disoriented on arrival from PACU. Reorienting slowly. Pt allowed me to place back on high flow nasal canula and replace pulse ox. Was on simple mask and no pulse ox on arrival d/t being confused post surgery, refusing it. Pt called brother Konrad Dolores to make sure our story was straight. Cont to monitor. Carroll Kinds RN

## 2019-08-05 NOTE — Progress Notes (Signed)
ANTICOAGULATION CONSULT NOTE  Pharmacy Consult for Heparin Indication: atrial fibrillation  Patient Measurements: Height: 6\' 3"  (190.5 cm) Weight: 158 lb 8 oz (71.9 kg) IBW/kg (Calculated) : 84.5 Heparin Dosing Weight: 74.4 kg  Vital Signs: Temp: 99 F (37.2 C) (01/11 0513) Temp Source: Oral (01/11 0513) BP: 110/64 (01/11 0513) Pulse Rate: 77 (01/11 0513)  Labs: Recent Labs    08/03/19 0037 08/03/19 1633 08/04/19 0007 08/04/19 6387 08/04/19 0934 08/04/19 1508 08/05/19 0354  HGB 8.4* 9.4*  --   --  8.4*  --  8.4*  HCT 24.8* 27.7*  --   --  25.2*  --  24.7*  PLT 387 402*  --   --  363  --  347  LABPROT 16.8*  --   --   --   --   --   --   INR 1.4*  --   --   --   --   --   --   HEPARINUNFRC 0.16*  --  0.10* 0.17*  --  0.52 0.42  CREATININE 0.78  --  0.75  --   --   --  0.81    Estimated Creatinine Clearance: 90 mL/min (by C-G formula based on SCr of 0.81 mg/dL).  Assessment: Timothy Martinez is a 28 YOM presenting with dizziness and dyspnea. PMH significant for atrial fibrillation on Xarelto PTA, alcoholic pancreatitis, GERD, COPD, T2DM, HF and HLD. CHADsVASc 4 (age, HTN, DM, HF). Last dose of Xarelto was on 07/29/2019.  Heparin level is therapeutic at 0.42 with a heparin infusion rate of 2300 units/hr. His H&H has been low but stable, last on 1/11 at 8.4/24.7. PLTs 347.    Goal of Therapy:  Heparin level 0.3-0.7 units/ml Monitor platelets by anticoagulation protocol: Yes   Plan:  Continue IV heparin at current rate 2300 units/hr. Daily HL, CBC Monitor for s/sx of bleeding   [place], Student-PharmD 08/05/2019 7:40 AM

## 2019-08-05 NOTE — Op Note (Signed)
NAME: Timothy Martinez, Timothy Martinez MEDICAL RECORD OA:[id number] ACCOUNT [id number] DATE OF BIRTH:01-08-1951 FACILITY: [facility] LOCATION: [id number] PHYSICIAN:Baylon Santelli Chaya Jan, MD  OPERATIVE REPORT  DATE OF PROCEDURE:  08/05/2019  PREOPERATIVE DIAGNOSIS:  Left upper lobe mass.  POSTOPERATIVE DIAGNOSIS:  Left upper lobe mass.  PROCEDURES:   1.  Bronchoscopy with radial endobronchial ultrasound and brushings, needle aspirations and transbronchial biopsies. 2.  Endobronchial ultrasound with mediastinal lymph node aspirations.  SURGEON:  Modesto Charon, MD  ASSISTANT:  None.  ANESTHESIA:  General.  FINDINGS:  Enlarged nodes.  No endobronchial component to the mass with the airway splayed out by mass and difficult to achieve entry into the mass for biopsies.  Quick-Preps revealed atypical cells, but no definite cancer.  CLINICAL NOTE:  Timothy Martinez is a 69 year old gentleman with a history of tobacco abuse who presented with dizziness and shortness of breath.  He was found to have a large left upper lobe mass.  He went into atrial fibrillation with a rapid ventricular  response and was treated medically for that.  He now is stable for bronchoscopy and endobronchial ultrasound for diagnostic purposes.  The indications, risks, benefits, and alternatives were discussed in detail with the patient.  He understood and  accepted the risks and agreed to proceed.  OPERATIVE NOTE:  Timothy Martinez was brought to the operating room on 08/05/2019.  He had induction of general anesthesia and was intubated.  A timeout was performed.  Flexible fiberoptic bronchoscopy was performed via the endotracheal tube.  The trachea and  right bronchial tree were within normal limits.  On the left side, the left mainstem and left lower lobe bronchus were normal.  There was some edema in the airway of the left upper lobe and some extrinsic compression and narrowing segmental bronchi, but  no endobronchial mass was seen.  There were  some thick white secretions, which were irrigated with saline.  The bronchoscope was removed.  The endobronchial ultrasound probe was advanced.  A large mass of nodes was noted in the subcarinal region.  Needle aspirations were performed.  With each aspiration, the needle was advanced into the node with real time ultrasound visualization and 10-15  passes were made in the node.  Samples were obtained with and without suction applied.  Part of the specimen was used for immediate examination and the remainder was placed in a cell block.  A level 4L node then was aspirated, as well as a level  11L.  Some of the aspirations were bloody.  None showed definitive evidence of cancer.  The endobronchial ultrasound probe was removed.    The bronchoscope then was replaced and directed into the left upper lobe bronchus.  Brushings were performed from 2  different segmental bronchi to the anterior and posterior segments.  It was difficult to get the brush or other instruments to advance through the airways into the tumor.  Fluoroscopy was used and a sheath for navigational bronchoscopy was used to try to  cannulate the appropriate segmental bronchus.  Ultimately, some atypical cells were seen on brushings, but no definitive diagnosis of cancer could be made from the most optimal position based on fluoroscopy.  Multiple biopsies were obtained and sent for  permanent pathology.  The airways were clear.  There was some minor bleeding after biopsies, but this cleared easily with a dilute epinephrine and saline solution.  The radial EBUS probe was also used to try to assist with obtaining a good biopsy and  that showed the area where the  primary biopsies were taken appeared to be well within the confines of the tumor.  Bronchial washings were obtained and were sent for cytology as well.  Final inspection was made with the bronchoscope.  There was no ongoing  bleeding.  The bronchoscope was removed.  The patient was  extubated in the operating room and taken to the postanesthetic care unit in good condition.  VN/NUANCE  D:08/05/2019 T:08/05/2019 JOB:[id number]

## 2019-08-05 NOTE — Brief Op Note (Signed)
08/05/2019  12:32 PM  PATIENT:  Timothy Martinez  69 y.o. male  PRE-OPERATIVE DIAGNOSIS:  LEFT UPPER LOBE MASS  POST-OPERATIVE DIAGNOSIS:  LEFT UPPER LOBE MASS  PROCEDURE:  Procedure(s): Video Bronchoscopy With Endobronchial Ultrasound (N/A) Video Bronchoscopy With Radial Endobronchial Ultrasound Needle aspirations, brushings and transbronchial biopsies  SURGEON:  Surgeon(s) and Role:    [name], MD - Primary  PHYSICIAN ASSISTANT:   ASSISTANTS: none   ANESTHESIA:   general  EBL:  30 mL   BLOOD ADMINISTERED:none  DRAINS: none   LOCAL MEDICATIONS USED:  NONE  SPECIMEN:  Source of Specimen:  lymph node aspirations, LUL biopsies, brushings  DISPOSITION OF SPECIMEN:  PATHOLOGY  COUNTS:  NO endoscopic  TOURNIQUET:  * No tourniquets in log *  DICTATION: .Other Dictation: Dictation Number -  PLAN OF CARE: return to inpatient  PATIENT DISPOSITION:  PACU - hemodynamically stable.   Wait 24 hours to resume anticoagulation

## 2019-08-05 NOTE — Anesthesia Procedure Notes (Signed)
Procedure Name: Intubation Date/Time: 08/05/2019 9:50 AM Performed by: Griffin Dakin, CRNA Pre-anesthesia Checklist: Patient identified, Emergency Drugs available, Suction available and Patient being monitored Patient Re-evaluated:Patient Re-evaluated prior to induction Oxygen Delivery Method: Circle system utilized Preoxygenation: Pre-oxygenation with 100% oxygen Induction Type: IV induction Ventilation: Mask ventilation without difficulty and Oral airway inserted - appropriate to patient size Laryngoscope Size: Glidescope and 3 Grade View: Grade I Tube type: Oral Number of attempts: 1 Airway Equipment and Method: Stylet and Oral airway Placement Confirmation: ETT inserted through vocal cords under direct vision,  positive ETCO2 and breath sounds checked- equal and bilateral Secured at: 25 cm Tube secured with: Tape Dental Injury: Teeth and Oropharynx as per pre-operative assessment

## 2019-08-05 NOTE — TOC Progression Note (Signed)
Transition of Care [facility]) - Progression Note    Patient Details  Name: Timothy Martinez MRN: [id number] Date of Birth: 1950/12/23  [facility] CM/SW Contact  Graves-Bigelow, Ocie Cornfield, RN Phone Number: 08/05/2019, 2:44 PM  Clinical Narrative:  Case Manager received a referral for outpatient palliative-spoke with daughter Timothy Martinez and she is agreeable to outpatient palliative services. Referral sent to [facility]. Patient will receive a call once transitioned home. Case Manager received another referral regarding assistance with housing in [place]. Daughter was thinking maybe a senior apartment in [place]. Case manager reached out to Case Manager provided daughter with several  [address] apartments in [place]. Daughter can look at those to see if can accommodate the patient. Case Manager also requested that daughter discuss with the patient about moving in with her or her sister. Previously [name] stated that both sisters have room for the patient if agreeable. Case Manager will continue to follow for additional transition of care needs.        Expected Discharge Plan: [place] Barriers to Discharge: Continued Medical Work up(Plan for bronhcoscopy)  Expected Discharge Plan and Services Expected Discharge Plan: [place] In-house Referral: NA Discharge Planning Services: CM Consult Post Acute Care Choice: [place] arrangements for the past 2 months: Single Family Home(Patient rents the upstairs of the home- has a small [place] area upstairs- has 15-17 steps to get upstairs.)                   Social Determinants of Health (SDOH) Interventions    Readmission Risk Interventions No flowsheet data found.

## 2019-08-05 NOTE — Progress Notes (Signed)
Progress Note  Patient Name: Timothy Martinez Date of Encounter: 08/05/2019  Primary Cardiologist: Mertie Moores, MD   Subjective   Pt denies CP or dyspnea. Sleepy after procedure this am  Inpatient Medications    Scheduled Meds: . acetaminophen  650 mg Oral Q6H  . amiodarone  400 mg Oral BID  . atorvastatin  20 mg Oral q1800  . famotidine  20 mg Oral BID  . feeding supplement (ENSURE ENLIVE)  237 mL Oral TID BM  . gabapentin  100 mg Oral TID  . levofloxacin  750 mg Oral Daily  . lipase/protease/amylase  72,000 Units Oral TID WC  . metoprolol tartrate  75 mg Oral BID  . mirtazapine  7.5 mg Oral QHS  . mometasone-formoterol  2 puff Inhalation BID  . multivitamin with minerals  1 tablet Oral Daily  . mupirocin ointment   Nasal BID  . oxyCODONE  5 mg Oral Q6H  . pantoprazole  40 mg Oral Daily  . polyethylene glycol  17 g Oral Daily   Continuous Infusions: . [START ON 08/06/2019] heparin     PRN Meds: levalbuterol, lipase/protease/amylase, metoprolol tartrate, nicotine, nitroGLYCERIN, oxyCODONE   Vital Signs    Vitals:   08/05/19 0513 08/05/19 1225 08/05/19 1232 08/05/19 1327  BP: 110/64 (!) 101/51 108/88 113/69  Pulse: 77  92 84  Resp: (!) 30 (!) 30  (!) 25  Temp: 99 F (37.2 C) 98.7 F (37.1 C)  98 F (36.7 C)  TempSrc: Oral   Oral  SpO2: 94%  93% 94%  Weight: 71.9 kg     Height:        Intake/Output Summary (Last 24 hours) at 08/05/2019 1450 Last data filed at 08/05/2019 1228 Gross per 24 hour  Intake 1469.34 ml  Output 955 ml  Net 514.34 ml   Last 3 Weights 08/05/2019 08/04/2019 08/03/2019  Weight (lbs) 158 lb 8 oz 168 lb 3.2 oz 162 lb 3.2 oz  Weight (kg) 71.895 kg 76.295 kg 73.573 kg      Telemetry    NSR- Personally Reviewed   Physical Exam   GEN: NAD WD Neck: supple Cardiac: RRR Respiratory: Diminished BS throughout; no rhonchi GI: Soft, NT, ND, no masses MS: No edema Neuro:  No focal findings Psych: Normal affect   Labs    High  Sensitivity Troponin:   Recent Labs  Lab 07/30/19 1719 07/30/19 1924  TROPONINIHS 12 12      Chemistry Recent Labs  Lab 08/03/19 0037 08/04/19 0007 08/05/19 0354  NA 137 139 141  K 3.3* 3.5 4.3  CL 107 105 104  CO2 22 26 30   GLUCOSE 111* 177* 106*  BUN 18 16 13   CREATININE 0.78 0.75 0.81  CALCIUM 8.0* 8.1* 8.2*  PROT 4.9* 5.2* 5.5*  ALBUMIN 1.5* 1.4* 1.4*  AST 25 40 31  ALT 17 19 18   ALKPHOS 285* 326* 244*  BILITOT 0.9 0.6 0.7  GFRNONAA >60 >60 >60  GFRAA >60 >60 >60  ANIONGAP 8 8 7      Hematology Recent Labs  Lab 08/03/19 1633 08/04/19 0934 08/05/19 0354  WBC 18.3* 18.3* 17.2*  RBC 3.40* 3.06* 3.01*  HGB 9.4* 8.4* 8.4*  HCT 27.7* 25.2* 24.7*  MCV 81.5 82.4 82.1  MCH 27.6 27.5 27.9  MCHC 33.9 33.3 34.0  RDW 15.2 15.4 15.5  PLT 402* 363 347    BNP Recent Labs  Lab 07/30/19 1714  BNP 386.3*     Radiology    DG  C-ARM BRONCHOSCOPY  Result Date: 08/05/2019 C-ARM BRONCHOSCOPY: Fluoroscopy was utilized by the requesting physician.  No radiographic interpretation.    Patient Profile     69 year old male with past medical history of paroxysmal atrial fibrillation, chronic diastolic congestive heart failure, COPD, pancreatitis, hypertension, hyperlipidemia, diabetes mellitus, prior splenectomy now with lung mass for evaluation of atrial fibrillation/flutter.  Patient had a nuclear study December 21 that showed no scar or ischemia; ejection fraction 61%.  Patient admitted with worsening dyspnea on exertion, elevated heart rate and dizziness.  He was found to have atrial fibrillation and was sent to the hospital.  Chest CT shows large left upper lobe lung mass with chest wall invasion and rib destruction and bronchoscopy is planned.    Assessment & Plan    1. Atrial fibrillation with rapid ventricular response-patient remains in sinus today on amiodarone.  Continue amiodarone 400 mg twice daily for 1 week and then change to 200 mg daily thereafter.  Continue  metoprolol at present dose.  Plan to resume heparin tomorrow post procedure. Resume oral anticoagulant once invasive procedures complete.  Echocardiogram shows ejection fraction 40 to 45% though technically difficult.  2. Preoperative evaluation prior to bronchoscopy-No CP and recent nuclear study negative.  Pt low risk for procedures.   3.  lung mass-likely primary cancer. Awaiting pathology results post bronchoscopy today.  4. history of chronic pancreatitis.  [facility] will sign off.   Medication Recommendations: as per MAR. Resume oral anticoagulation once procedures complete. Amiodarone dosing as noted above Other recommendations (labs, testing, etc):  none Follow up as an outpatient:  With Dr Acie Fredrickson in 2-3 weeks.    For questions or updates, please contact [place] Please consult [email or web address] for contact info under        Signed, Alylah Blakney [place], MD  08/05/2019, 2:50 PM

## 2019-08-05 NOTE — Progress Notes (Signed)
I was unable to evaluate patient today since he was in surgery. Patient signed out to the PM attending at 12:30 PM. I have discussed the care plan for today with the resident. Resident will reeval and I will cosign their note.

## 2019-08-05 NOTE — Anesthesia Postprocedure Evaluation (Signed)
Anesthesia Post Note  Patient: Timothy Martinez  Procedure(s) Performed: Video Bronchoscopy With Endobronchial Ultrasound (N/A ) Video Bronchoscopy With Radial Endobronchial Ultrasound     Patient location during evaluation: PACU Anesthesia Type: General Level of consciousness: awake Pain management: pain level controlled Vital Signs Assessment: post-procedure vital signs reviewed and stable Respiratory status: spontaneous breathing, nonlabored ventilation, respiratory function stable and patient connected to nasal cannula oxygen Cardiovascular status: blood pressure returned to baseline and stable Postop Assessment: no apparent nausea or vomiting Anesthetic complications: no    Last Vitals:  Vitals:   08/05/19 1232 08/05/19 1327  BP: 108/88 113/69  Pulse: 92 84  Resp:  (!) 25  Temp:  36.7 C  SpO2: 93% 94%    Last Pain:  Vitals:   08/05/19 1327  TempSrc: Oral  PainSc:                  Marice Guidone P Jalynne Persico

## 2019-08-05 NOTE — Plan of Care (Signed)
  Problem: Clinical Measurements: Goal: Cardiovascular complication will be avoided Outcome: Progressing   Problem: Coping: Goal: Level of anxiety will decrease Outcome: Progressing

## 2019-08-05 NOTE — Progress Notes (Signed)
Brief Palliative follow up.  Patient complains that his pain medicine did not work as well today.  He has been receiving scheduled Oxy but has not received PRNs.  Bedside RN explains that his meds were held prior to his bronchoscopy this am and he has been out of whack since.  She also expressed concern over low BP.  Discussed pain medications with patient.  Provided education to ask for PRN oxy when needed (he did not know if was available).  Increased Senna to two tabs this evening as he is complaining of constipation.  (May reduce it once he starts having stools - anticipate pain meds will constipate him).  Florentina Jenny, PA-C Palliative Medicine Office:  [phone]  Greater than 50%  of this time was spent counseling and coordinating care related to the above assessment and plan  15 min.

## 2019-08-05 NOTE — Consult Note (Signed)
   [facility] CM Inpatient Consult   08/05/2019  NAZARIO RUSSOM Dec 23, 1950 [id number]   Call received from inpatient [facility] regarding patient in network.  Patient is in the [place] noted with a HX with [facility] [facility] Management team.  Patient is S/P VATS. Patient for palliative care follow up needs. For questions, please contact:  Natividad Brood, RN BSN [facility] Liaison  [phone] business mobile phone Toll free office [phone]  Fax number: [phone] [email or web address]

## 2019-08-05 NOTE — Interval H&P Note (Signed)
History and Physical Interval Note:  08/05/2019 7:24 AM  Timothy Martinez  has presented today for surgery, with the diagnosis of LEFT UPPER LOBE MASS.  The various methods of treatment have been discussed with the patient and family. After consideration of risks, benefits and other options for treatment, the patient has consented to  Procedure(s): Benjamin (N/A) as a surgical intervention.  The patient's history has been reviewed, patient examined, no change in status, stable for surgery.  I have reviewed the patient's chart and labs.  Questions were answered to the patient's satisfaction.     [name]

## 2019-08-06 DIAGNOSIS — Z515 Encounter for palliative care: Secondary | ICD-10-CM

## 2019-08-06 DIAGNOSIS — R0602 Shortness of breath: Secondary | ICD-10-CM

## 2019-08-06 DIAGNOSIS — R918 Other nonspecific abnormal finding of lung field: Secondary | ICD-10-CM

## 2019-08-06 DIAGNOSIS — R627 Adult failure to thrive: Secondary | ICD-10-CM

## 2019-08-06 LAB — CBC WITH DIFFERENTIAL/PLATELET
Abs Immature Granulocytes: 0.61 10*3/uL — ABNORMAL HIGH (ref 0.00–0.07)
Basophils Absolute: 0 10*3/uL (ref 0.0–0.1)
Basophils Relative: 0 %
Eosinophils Absolute: 0 10*3/uL (ref 0.0–0.5)
Eosinophils Relative: 0 %
HCT: 24.3 % — ABNORMAL LOW (ref 39.0–52.0)
Hemoglobin: 7.9 g/dL — ABNORMAL LOW (ref 13.0–17.0)
Immature Granulocytes: 5 %
Lymphocytes Relative: 7 %
Lymphs Abs: 0.9 10*3/uL (ref 0.7–4.0)
MCH: 27.1 pg (ref 26.0–34.0)
MCHC: 32.5 g/dL (ref 30.0–36.0)
MCV: 83.2 fL (ref 80.0–100.0)
Monocytes Absolute: 0.8 10*3/uL (ref 0.1–1.0)
Monocytes Relative: 6 %
Neutro Abs: 11.3 10*3/uL — ABNORMAL HIGH (ref 1.7–7.7)
Neutrophils Relative %: 82 %
Platelets: 364 10*3/uL (ref 150–400)
RBC: 2.92 MIL/uL — ABNORMAL LOW (ref 4.22–5.81)
RDW: 16 % — ABNORMAL HIGH (ref 11.5–15.5)
WBC: 13.6 10*3/uL — ABNORMAL HIGH (ref 4.0–10.5)
nRBC: 1.8 % — ABNORMAL HIGH (ref 0.0–0.2)

## 2019-08-06 LAB — TYPE AND SCREEN
ABO/RH(D): O POS
Antibody Screen: NEGATIVE

## 2019-08-06 LAB — GLUCOSE, CAPILLARY: Glucose-Capillary: 153 mg/dL — ABNORMAL HIGH (ref 70–99)

## 2019-08-06 LAB — COMPREHENSIVE METABOLIC PANEL
ALT: 18 U/L (ref 0–44)
AST: 35 U/L (ref 15–41)
Albumin: 1.4 g/dL — ABNORMAL LOW (ref 3.5–5.0)
Alkaline Phosphatase: 221 U/L — ABNORMAL HIGH (ref 38–126)
Anion gap: 8 (ref 5–15)
BUN: 22 mg/dL (ref 8–23)
CO2: 30 mmol/L (ref 22–32)
Calcium: 8.4 mg/dL — ABNORMAL LOW (ref 8.9–10.3)
Chloride: 104 mmol/L (ref 98–111)
Creatinine, Ser: 0.71 mg/dL (ref 0.61–1.24)
GFR calc Af Amer: >60 mL/min (ref >60)
GFR calc non Af Amer: >60 mL/min (ref >60)
Glucose, Bld: 182 mg/dL — ABNORMAL HIGH (ref 70–99)
Potassium: 4.1 mmol/L (ref 3.5–5.1)
Sodium: 142 mmol/L (ref 135–145)
Total Bilirubin: 0.4 mg/dL (ref 0.3–1.2)
Total Protein: 5.6 g/dL — ABNORMAL LOW (ref 6.5–8.1)

## 2019-08-06 LAB — CYTOLOGY - NON PAP

## 2019-08-06 LAB — SURGICAL PATHOLOGY

## 2019-08-06 MED ORDER — RIVAROXABAN 20 MG PO TABS
20.0000 mg | ORAL_TABLET | Freq: Every day | ORAL | Status: DC
  Administered 2019-08-06: 20 mg via ORAL
  Filled 2019-08-06: qty 1

## 2019-08-06 NOTE — Progress Notes (Signed)
1 Day Post-Op Procedure(s) (LRB): Video Bronchoscopy With Endobronchial Ultrasound (N/A) Video Bronchoscopy With Radial Endobronchial Ultrasound Subjective: No complaints, denies hemoptysis  Objective: Vital signs in last 24 hours: Temp:  [97.4 F (36.3 C)-98.7 F (37.1 C)] 97.4 F (36.3 C) (01/12 0426) Pulse Rate:  [72-92] 72 (01/11 2324) Cardiac Rhythm: Normal sinus rhythm (01/12 0706) Resp:  [18-33] 20 (01/11 1800) BP: (90-125)/(42-88) 114/65 (01/11 2324) SpO2:  [86 %-99 %] 99 % (01/11 2341) Weight:  [72.2 kg] 72.2 kg (01/12 0426)  Hemodynamic parameters for last 24 hours:    Intake/Output from previous day: 01/11 0701 - 01/12 0700 In: 1240 [P.O.:240; I.V.:1000] Out: 800 [Urine:770; Blood:30] Intake/Output this shift: No intake/output data recorded.  General appearance: alert, cooperative and no distress Lungs: clear  Lab Results: Recent Labs    08/05/19 0354 08/06/19 0729  WBC 17.2* 13.6*  HGB 8.4* 7.9*  HCT 24.7* 24.3*  PLT 347 364   BMET:  Recent Labs    08/05/19 0354 08/06/19 0729  NA 141 142  K 4.3 4.1  CL 104 104  CO2 30 30  GLUCOSE 106* 182*  BUN 13 22  CREATININE 0.81 0.71  CALCIUM 8.2* 8.4*    PT/INR: No results for input(s): LABPROT, INR in the last 72 hours. ABG    Component Value Date/Time   TCO2 22 08/04/2014 1651   CBG (last 3)  Recent Labs    08/04/19 0810 08/05/19 1325 08/06/19 0745  GLUCAP 95 114* 153*    Assessment/Plan: S/P Procedure(s) (LRB): Video Bronchoscopy With Endobronchial Ultrasound (N/A) Video Bronchoscopy With Radial Endobronchial Ultrasound -Doing well Ok to resume anticoagulation Path pending We did not get a definitie answer yesterday, but hopefully final path will give [place] an answer. If not, Ct guided biopsy would be next step   LOS: 6 days    Timothy Martinez 08/06/2019

## 2019-08-06 NOTE — Care Management Important Message (Signed)
Important Message  Patient Details  Name: Timothy Martinez MRN: [id number] Date of Birth: [birth year]   Medicare Important Message Given:  Yes     Shelda Altes 08/06/2019, 11:24 AM

## 2019-08-06 NOTE — Progress Notes (Signed)
Occupational Therapy Treatment Patient Details Name: EION TIMBROOK MRN: [id number] DOB: 01-11-1951 Today's Date: 08/06/2019    History of present illness 69 yo man with a history of tobacco abuse, COPD, atrial fibrillation, atrial flutter, ethanol abuse, chronic pancreatitis, hypertension, hyperlipidemia, and chronic back pain. Admitted due to dizziness with hypotension, weakness and anemia  CT of the chest confirmed a large left upper lobe mass with chest wall invasion along with mediastinal and hilar adenopathy.   OT comments  Pt progressing well for OOB ADL tasks at sink in standing with supervisionA. Pt >95% O2 on 6L [place]. Pt tolerating session well with supervision overall for mobility. Pt with no dizziness reported. Pt would benefit from continued OT skilled services for energy conservation techniques for ADL and mobility. OT following acutely.   Follow Up Recommendations  Home health OT    Equipment Recommendations  Tub/shower seat    Recommendations for Other Services      Precautions / Restrictions Precautions Precautions: Fall Precaution Comments: reports has fallen down 7 of the 17 steps to his apartment, but denies injury Restrictions Weight Bearing Restrictions: No       Mobility Bed Mobility Overal bed mobility: Modified Independent Bed Mobility: Supine to Sit;Sit to Supine              Transfers Overall transfer level: Needs assistance Equipment used: None Transfers: Sit to/from Stand Sit to Stand: Supervision         General transfer comment: RW    Balance Overall balance assessment: Needs assistance Sitting-balance support: Feet unsupported Sitting balance-Leahy Scale: Good     Standing balance support: Single extremity supported;Bilateral upper extremity supported;During functional activity Standing balance-Leahy Scale: Fair                             ADL either performed or assessed with clinical judgement   ADL Overall  ADL's : Needs assistance/impaired     Grooming: Supervision/safety;Standing Grooming Details (indicate cue type and reason): standing x5 mins for ADL             Lower Body Dressing: Min guard;Sitting/lateral leans;Sit to/from stand Lower Body Dressing Details (indicate cue type and reason): performing figure 4 technique in sitting             Functional mobility during ADLs: Supervision/safety;Rolling walker General ADL Comments: Pt limited by decreased activity tolerance.     Vision   Vision Assessment?: No apparent visual deficits   Perception     Praxis      Cognition Arousal/Alertness: Awake/alert Behavior During Therapy: WFL for tasks assessed/performed Overall Cognitive Status: Within Functional Limits for tasks assessed                                          Exercises     Shoulder Instructions       General Comments VSS. >95% O2 on 6L [place].    Pertinent Vitals/ Pain       Pain Assessment: Faces Faces Pain Scale: Hurts a little bit Pain Location: chest Pain Descriptors / Indicators: Sore Pain Intervention(s): Monitored during session  Home Living  Prior Functioning/Environment              Frequency  Min 2X/week        Progress Toward Goals  OT Goals(current goals can now be found in the care plan section)  Progress towards OT goals: Progressing toward goals  Acute Rehab OT Goals Patient Stated Goal: to return to independent, go home OT Goal Formulation: With patient Time For Goal Achievement: 08/16/19 Potential to Achieve Goals: Good ADL Goals Pt Will Perform Grooming: with min guard assist;with supervision;with set-up;standing Pt Will Perform Upper Body Bathing: with set-up;sitting Pt Will Perform Lower Body Bathing: with min guard assist;with supervision;with set-up;sitting/lateral leans;sit to/from stand Pt Will Perform Upper Body Dressing: with  set-up;sitting Pt Will Perform Lower Body Dressing: with min guard assist;with supervision;with set-up;sitting/lateral leans;sit to/from stand Pt Will Transfer to Toilet: with modified independence;ambulating Pt Will Perform Toileting - Clothing Manipulation and hygiene: with supervision;with modified independence;sit to/from stand Pt Will Perform Tub/Shower Transfer: with supervision;with modified independence;ambulating;3 in 1;shower seat;grab bars  Plan Discharge plan remains appropriate    Co-evaluation                 AM-PAC OT "6 Clicks" Daily Activity     Outcome Measure   Help from another person eating meals?: None Help from another person taking care of personal grooming?: None Help from another person toileting, which includes using toliet, bedpan, or urinal?: None Help from another person bathing (including washing, rinsing, drying)?: A Little Help from another person to put on and taking off regular upper body clothing?: None Help from another person to put on and taking off regular lower body clothing?: None 6 Click Score: 23    End of Session Equipment Utilized During Treatment: Rolling walker  OT Visit Diagnosis: Unsteadiness on feet (R26.81);Pain Pain - part of body: (chest)   Activity Tolerance Patient tolerated treatment well   Patient Left in bed;with call bell/phone within reach   Nurse Communication Mobility status        Time: 1540-0867 OT Time Calculation (min): 19 min  Charges: OT General Charges $OT Visit: 1 Visit OT Treatments $Self Care/Home Management : 8-22 mins  Jefferey Pica OTR/L Acute Rehabilitation Services Pager: [phone] Office: [phone]    Saphire Barnhart C 08/06/2019, 5:27 PM

## 2019-08-06 NOTE — Progress Notes (Signed)
Patient ID: Timothy Martinez, male   DOB: May 17, 1951, 69 y.o.   MRN: [id number]  This NP visited patient at the bedside as a follow up to previous  [place].  Introduced myself as member of PMT.  [name] is pleasant and engaged he tells me he feels "good".  Inquired about pain management and he reports "good" pain control.  He awaits pathology from lung biopsy.  Will need to explore options depending on outcomes.  We discussed anticipated care needs once discharged. "I'm good, I have so many friends who are waiting for me to come home so they can help".    Likely will have home health and recommend OP community based palliative services on discharge.  Discussed with patient the importance of continued conversation with his family and his medical providers regarding overall plan of care and treatment options,  ensuring decisions are within the context of the patient's values and GOCs.  Encouraged him to consider his advanced directive decisions, and stressed the importance of documentation of his wishes,  he will include his daughter Sarita Bottom.  For today he remains full code.  Questions and concerns addressed   Discussed with Family Medicine resident and Dr Andria Frames.  OP follow up will be important for him regardless of biopsy results.  Total time spent on the unit was 45 minutes  Greater than 50% of the time was spent in counseling and coordination of care  Wadie Lessen NP  Palliative Medicine Team Team Phone # [phone] Pager [phone]

## 2019-08-06 NOTE — Progress Notes (Signed)
Physical Therapy Treatment Patient Details Name: Timothy Martinez MRN: [id number] DOB: 23-Sep-1950 Today's Date: 08/06/2019    History of Present Illness 69 yo man with a history of tobacco abuse, COPD, atrial fibrillation, atrial flutter, ethanol abuse, chronic pancreatitis, hypertension, hyperlipidemia, and chronic back pain. Admitted due to dizziness with hypotension, weakness and anemia  CT of the chest confirmed a large left upper lobe mass with chest wall invasion along with mediastinal and hilar adenopathy.    PT Comments    Patient is making progress toward PT goals and tolerated increased mobility well. Pt reports feeling much better the past 2 days. Pt tolerated gait distance of 400 ft with rollator and min guard/min A. SpO2 90% or > on 3L O2 via HFNC and pt without SOB. Current plan remains appropriate.     Follow Up Recommendations  Supervision - Intermittent;Home health PT     Equipment Recommendations  None recommended by PT    Recommendations for Other Services       Precautions / Restrictions Precautions Precautions: Fall Precaution Comments: reports has fallen down 7 of the 17 steps to his apartment, but denies injury Restrictions Weight Bearing Restrictions: No    Mobility  Bed Mobility Overal bed mobility: Independent Bed Mobility: Supine to Sit;Sit to Supine              Transfers Overall transfer level: Needs assistance Equipment used: None Transfers: Sit to/from Stand Sit to Stand: Supervision         General transfer comment: use of rollator upon standing to steady  Ambulation/Gait Ambulation/Gait assistance: Supervision;Min guard Gait Distance (Feet): 400 Feet Assistive device: (rollator) Gait Pattern/deviations: Step-through pattern;Decreased stride length Gait velocity: decreased   General Gait Details: 2 standing rest breaks needed; pt denies SOB and SpO2 90% or > on 3L O2 via HFNC   Stairs             Wheelchair Mobility    Modified Rankin (Stroke Patients Only)       Balance Overall balance assessment: Needs assistance Sitting-balance support: Feet unsupported Sitting balance-Leahy Scale: Good     Standing balance support: Single extremity supported;Bilateral upper extremity supported;During functional activity Standing balance-Leahy Scale: Fair                              Cognition Arousal/Alertness: Awake/alert Behavior During Therapy: WFL for tasks assessed/performed Overall Cognitive Status: Within Functional Limits for tasks assessed                                        Exercises      General Comments        Pertinent Vitals/Pain Pain Assessment: Faces Faces Pain Scale: Hurts little more Pain Location: chest Pain Descriptors / Indicators: Sore Pain Intervention(s): Monitored during session;Repositioned    Home Living                      Prior Function            PT Goals (current goals can now be found in the care plan section) Acute Rehab PT Goals Patient Stated Goal: to return to independent, go home Progress towards PT goals: Progressing toward goals    Frequency    Min 3X/week      PT Plan Current plan remains appropriate    Co-evaluation  AM-PAC PT "6 Clicks" Mobility   Outcome Measure  Help needed turning from your back to your side while in a flat bed without using bedrails?: None Help needed moving from lying on your back to sitting on the side of a flat bed without using bedrails?: None Help needed moving to and from a bed to a chair (including a wheelchair)?: A Little Help needed standing up from a chair using your arms (e.g., wheelchair or bedside chair)?: None Help needed to walk in hospital room?: A Little Help needed climbing 3-5 steps with a railing? : A Little 6 Click Score: 21    End of Session Equipment Utilized During Treatment: Gait belt Activity Tolerance: Patient tolerated  treatment well Patient left: in bed;with call bell/phone within reach;with bed alarm set Nurse Communication: Mobility status PT Visit Diagnosis: Difficulty in walking, not elsewhere classified (R26.2);Pain Pain - Right/Left: Left Pain - part of body: Shoulder     Time: 3174-0992 PT Time Calculation (min) (ACUTE ONLY): 34 min  Charges:  $Gait Training: 23-37 mins                     Earney Navy, PTA Acute Rehabilitation Services Pager: [phone] Office: [phone]     Darliss Cheney 08/06/2019, 2:28 PM

## 2019-08-06 NOTE — Progress Notes (Addendum)
Family Medicine Teaching Service Daily Progress Note Intern Pager: [phone]  Patient name: Timothy Martinez Medical record number: [id number] Date of birth: Apr 01, 1951 Age: 69 y.o. Gender: male  Primary Care Provider: [name], DO Consultants: CVTS   Code Status: Full   Pt Overview and Major Events to Date:  07/30/19: Admitted, CT Lung Mass with rib infiltration  08/06/19: Bronchoscopy   Assessment and Plan: Timothy Martinez is a 69 y.o. male who presented dizziness and dyspnea. PMH is significant for chronic alcoholic pancreatitis, tobacco abuse, GERD, COPD, type II diabetes, AFIB, HFpEF, lumbar back pain, hyperlipidemia    Acute hypoxic respiratory failure  Pneumonia (CAP vs. Obstructive)  Large lung mass  Satting well on 4 L HFNC.  Weaning oxygen as tolerated.  Occasional rhonchi on exam that clear with cough.  Follow up on CVTS post-procedure recommendations.  Considering CT-guided biopsy, pathology pending.  Hemoglobin down trended slightly to 7.9 from 8.4 yesterday. ALP down trending.  - CVTS following, appreciate recommendations: s/p Bronch 1/11  - Palliative following, appreciate recommendations: continue palliative outpatient  - Doxycycline (1/7 - 1/9) - Levaquin 750 mg  (1/9 - ) - Oxycodone 5mg  q6h   - AM CBC w/ diff  - Continuous cardiac monitoring and pulse oximetry  - PT/OT : HH PT/ OT ordered  - Ambulate with pulse ox prior to discharge   A. Fibrillation, now rate controlled HR well controlled (72-92) with oral amiodarone 400mg  BID (for one week and then 200 mg daily thereafter) and home metoprolol 75 mg BID.  Restart Xarelto tonight. - Cardiology signed off, appreciate recommendations  -Continue cardiac monitoring, pulse oximetry -metoprolol 75 mg twice daily -Amiodorone 400 mg BID  (1/9 -  1/15)  -2.5 mg IV Metoprolol PRN  for sustained HR >120  -Restart Xarelto  tonight.   Anemia Hemoglobin stable. - Daily CBC   AKI., resolved  - Monitor AM CMP   Hypomagnesemia Mg 1.7. Replete as necessary. Goal 2.0 -Follow-up a.m. Mg   Chronic pancreatitis 2/2 previous ETOH excess Chronic abdominal pain unchanged. RUQ u/s shows chronic biliary dilitation due to chronic pancreatitis. No acute findings.  Patient follow with [facility]. CT abdomen 07/30/19: Stable pancreatic ductal dilatation and multiple calcifications. Previously seen pseudocyst in head of the pancreas poorly visualized. Home meds: Ensure supplements TID between meals, Norco/Vicodin 5-325 mg every 6 hourly, Creon 2 capsules 5 times daily, Zofran 4 mg every 8 hourly, Compazine 10 mg every 8 hourly -Continue home meds -Hep B/HepC/Hep A: neg for infection  Diabetes Mellitus Fasting glucose 153  today, last A1c 6.1 10/31/2017 Home meds: Metformin 1 g BID -Hold Metformin, no need for sliding scale at present -Monitor CBGs daily with BMP  Left ear ulcer See media for image.  Concern for skin cancer given chronic nature of this ulceration and location on a frequently sun exposed area. Patient reports wound has been present for 2-3 months.  -Xerform dressing, per Wound RN consultation  -Dermatology outpatient referral   Hyperlipidemia Home meds: Atorvastatin 20 mg once daily -Lipid panel  -Continue statin  HFpEF  Echocardiogram w/ limited study shows LVEF 40-45%. Follows with heart care, last seen in 2019 without concern. EF 55-60%with G1 DD on echo in 12/2014. - Cardiology following, appreciate recommendations -Monitor fluid status   COPD Continue to monitor respiratory status. Home meds: Dulera BID  -Continue Dulera -Albuterol PRN   GERD Home meds: Pantoprazole 40mg  once daily -Continue Protinix  -Pepcid 20 mg BID   Tobacco use Smokes 1  ppd since age 18, patient has cut down from 3 ppd -14 mg nicotine patch as needed  Chronic lower back pain, chronic, stable Currently asymptomatic. Home meds: hydrocodone, will continue - Scheduled Tylenol 650 mg q6h  - Oxycodone  5mg  BID   Previous alcohol use: Stable Last drink was 1.5 years ago.  Encouraged patient to keep this up.  Hx of colonic polyps Three sessile polpys resected during colonoscopy in 2019. No evidence of malignancy   Constipation Home meds: MiraLAX, -Continue home meds  FEN/GI: Regular diet, Ensure TID, Multivitamin PPx: Heparin gtt  Disposition: awaiting CVTS procedure on 01/11  Subjective:  [name] had no significant overnight events.  States that he feels great this morning.  Objective: Temp:  [97.4 F (36.3 C)-98.7 F (37.1 C)] 97.4 F (36.3 C) (01/12 0426) Pulse Rate:  [72-92] 72 (01/11 2324) Resp:  [18-33] 20 (01/11 1800) BP: (90-125)/(42-88) 114/65 (01/11 2324) SpO2:  [86 %-99 %] 99 % (01/11 2341) Weight:  [72.2 kg] 72.2 kg (01/12 0426)   Physical Exam:  GEN: Pleasant male, sitting in the chair, non-ill-appearing CV: regular rate and rhythm, no murmurs appreciated  RESP: No increased work of breathing, occasional rhonchi are clear with cough, no wheezing, no rales ABD: Bowel sounds present. Soft, Nontender, Nondistended. MSK: no lower extremity edema, or cyanosis noted SKIN: warm, dry NEURO: grossly normal, moves all extremities appropriately PSYCH: Normal affect and thought content      Laboratory: Recent Labs  Lab 08/03/19 1633 08/04/19 0934 08/05/19 0354  WBC 18.3* 18.3* 17.2*  HGB 9.4* 8.4* 8.4*  HCT 27.7* 25.2* 24.7*  PLT 402* 363 347   Recent Labs  Lab 08/04/19 0007 08/05/19 0354 08/06/19 0729  NA 139 141 142  K 3.5 4.3 4.1  CL 105 104 104  CO2 26 30 30   BUN 16 13 22   CREATININE 0.75 0.81 0.71  CALCIUM 8.1* 8.2* 8.4*  PROT 5.2* 5.5* 5.6*  BILITOT 0.6 0.7 0.4  ALKPHOS 326* 244* 221*  ALT 19 18 18   AST 40 31 35  GLUCOSE 177* 106* 182*      Imaging/Diagnostic Tests: DG C-ARM BRONCHOSCOPY  Result Date: 08/05/2019 C-ARM BRONCHOSCOPY: Fluoroscopy was utilized by the requesting physician.  No radiographic interpretation.     [name], DO PGY-1, [facility] 08/06/2019 8:32 AM

## 2019-08-07 ENCOUNTER — Ambulatory Visit: Payer: Medicare Other | Admitting: Sports Medicine

## 2019-08-07 ENCOUNTER — Encounter: Payer: Self-pay | Admitting: *Deleted

## 2019-08-07 LAB — COMPREHENSIVE METABOLIC PANEL
ALT: 22 U/L (ref 0–44)
AST: 40 U/L (ref 15–41)
Albumin: 1.5 g/dL — ABNORMAL LOW (ref 3.5–5.0)
Alkaline Phosphatase: 179 U/L — ABNORMAL HIGH (ref 38–126)
Anion gap: 8 (ref 5–15)
BUN: 23 mg/dL (ref 8–23)
CO2: 29 mmol/L (ref 22–32)
Calcium: 8.3 mg/dL — ABNORMAL LOW (ref 8.9–10.3)
Chloride: 104 mmol/L (ref 98–111)
Creatinine, Ser: 0.77 mg/dL (ref 0.61–1.24)
GFR calc Af Amer: >60 mL/min (ref >60)
GFR calc non Af Amer: >60 mL/min (ref >60)
Glucose, Bld: 230 mg/dL — ABNORMAL HIGH (ref 70–99)
Potassium: 4.3 mmol/L (ref 3.5–5.1)
Sodium: 141 mmol/L (ref 135–145)
Total Bilirubin: 0.5 mg/dL (ref 0.3–1.2)
Total Protein: 5.5 g/dL — ABNORMAL LOW (ref 6.5–8.1)

## 2019-08-07 LAB — CBC WITH DIFFERENTIAL/PLATELET
Abs Immature Granulocytes: 0.59 10*3/uL — ABNORMAL HIGH (ref 0.00–0.07)
Basophils Absolute: 0 10*3/uL (ref 0.0–0.1)
Basophils Relative: 0 %
Eosinophils Absolute: 0 10*3/uL (ref 0.0–0.5)
Eosinophils Relative: 0 %
HCT: 24.7 % — ABNORMAL LOW (ref 39.0–52.0)
Hemoglobin: 8.3 g/dL — ABNORMAL LOW (ref 13.0–17.0)
Immature Granulocytes: 3 %
Lymphocytes Relative: 7 %
Lymphs Abs: 1.2 10*3/uL (ref 0.7–4.0)
MCH: 27.8 pg (ref 26.0–34.0)
MCHC: 33.6 g/dL (ref 30.0–36.0)
MCV: 82.6 fL (ref 80.0–100.0)
Monocytes Absolute: 1 10*3/uL (ref 0.1–1.0)
Monocytes Relative: 5 %
Neutro Abs: 16.1 10*3/uL — ABNORMAL HIGH (ref 1.7–7.7)
Neutrophils Relative %: 85 %
Platelets: 355 10*3/uL (ref 150–400)
RBC: 2.99 MIL/uL — ABNORMAL LOW (ref 4.22–5.81)
RDW: 16.8 % — ABNORMAL HIGH (ref 11.5–15.5)
WBC: 18.9 10*3/uL — ABNORMAL HIGH (ref 4.0–10.5)
nRBC: 1.6 % — ABNORMAL HIGH (ref 0.0–0.2)

## 2019-08-07 LAB — GLUCOSE, CAPILLARY: Glucose-Capillary: 154 mg/dL — ABNORMAL HIGH (ref 70–99)

## 2019-08-07 LAB — PROTIME-INR
INR: 1.5 — ABNORMAL HIGH (ref 0.8–1.2)
Prothrombin Time: 17.9 seconds — ABNORMAL HIGH (ref 11.4–15.2)

## 2019-08-07 LAB — MAGNESIUM: Magnesium: 1.8 mg/dL (ref 1.7–2.4)

## 2019-08-07 MED ORDER — MAGNESIUM SULFATE 50 % IJ SOLN: 2.0000 g | Freq: Once | INTRAVENOUS | Status: DC

## 2019-08-07 MED ORDER — SODIUM CHLORIDE 0.9 % IV SOLN
2.0000 g | Freq: Once | INTRAVENOUS | Status: AC
  Administered 2019-08-07: 2 g via INTRAVENOUS
  Filled 2019-08-07: qty 4

## 2019-08-07 MED ORDER — RIVAROXABAN 20 MG PO TABS
20.0000 mg | ORAL_TABLET | Freq: Every day | ORAL | Status: DC
  Administered 2019-08-09: 20 mg via ORAL
  Filled 2019-08-07: qty 1

## 2019-08-07 NOTE — Progress Notes (Signed)
      [address]       [place] [address]             [phone]      Mr. Crisci feels well this afternoon, not SOB and no pain at present  BP 134/69   Pulse 64   Temp (!) 97.5 F (36.4 C) (Oral)   Resp (!) 24   Ht 6\' 3"  (1.905 m)   Wt 71.4 kg   SpO2 92%   BMI 19.67 kg/m   Unfortunately bronch cytologies and path did not give a definitive diagnosis. Atypical cells were seen but not enough to call carcinoma.   Agree with plan to have IR do CT guided needle biopsy  Remo Lipps C. Roxan Hockey, MD [facility] [phone]

## 2019-08-07 NOTE — Consult Note (Signed)
Chief Complaint: Patient was seen in consultation today for lung mass  Referring Physician(s): Dr. Roxan Hockey  Supervising Physician: Markus Daft  Patient Status: [facility] - In-pt  History of Present Illness: Timothy Martinez is a 69 y.o. male with history of alcohol abuse disorder, alcoholic pancreatitis, anxiety, a fib on Xarelto, CHF, COPD, s/p splenectomy several years ago who presented to the ED with hypotension and shortness of breath.   CTA Chest 07/29/18 showed: Large left upper lobe mass lesion with apparent chest wall invasion in rib destruction as described consistent with a primary pulmonary neoplasm till proven otherwise. Associated mediastinal and hilar adenopathy is seen.  Patient was planning to undergo bronchoscopy 1/7, however went into A fib with RVR with increased O2 demand and bronch was delayed until 1/11.  Pathology has returned with aytpical cells, but no diagnostic material. IR now consulted for image-guided biopsy.   Case reviewed by Dr. Anselm Pancoast who approves patient for procedure.  Last dose Xarelto was 1/12.   Patient assessed at bedside this afternoon.  He requires 2-4L O2 by [place] to maintain O2 sats in the low 90s.  HR 60-65.  Reports he has had multiple procedures and surgeries in his lifetime and states "it has to be done, so let's get it done."  Past Medical History:  Diagnosis Date  . Acute on chronic pancreatitis ([place]) 05/17/2016  . Alcohol abuse   . Alcoholic pancreatitis   . Anginal pain ([place])   . Anxiety and depression 01/20/2015   PHQ 9 = 11 (01/20/15)    . Arthritis    "eat up w/it" (05/17/2016)  . Atrial fibrillation status post cardioversion ([place]) 08/2014  . Atrial flutter, unspecified   . CHF (congestive heart failure) ([place])   . Chronic anticoagulation   . Chronic atrial fibrillation ([place])   . Chronic bronchitis ([place])   . Chronic left-sided low back pain with sciatica 12/21/2012  . Chronic pancreatitis ([place])   . COLD (chronic obstructive  lung disease) ([place])   . Complication of anesthesia    pt C/O confusion after procedure in April 2020  . Daily headache    "need eye examined" (05/17/2016)  . Gastric outlet obstruction 10/2018  . GERD (gastroesophageal reflux disease)   . Hematuria, microscopic 02/13/2015   Noted on UA - Recheck UA at next visit ~ 1 months   . Hyperlipidemia   . Hypertension   . Insomnia 02/11/2016  . Lateral epicondylitis of right elbow 03/17/2017  . Left foot pain 10/12/2013  . Loss of weight 02/19/2018  . Low back pain 12/21/2012  . Malnutrition of moderate degree 05/18/2016  . Neck pain, bilateral posterior 10/15/2014  . Neuropathy 03/16/2015  . Pain of joint of left ankle and foot 06/13/2018  . Pain of right upper extremity 05/30/2016  . Pancreatic pseudocyst    seen on CT scan 07/2014  . Pancreatitis 08/04/2014  . Permanent atrial fibrillation ([place]) 05/05/2017  . Pneumonia    "?a few times" (05/17/2016)  . 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 Tobacco abuse 05/30/2011   Heavy smoker up to 3 ppd down to 15 cigs per day in 09/2014   . Type II diabetes mellitus ([place])   . Wears dentures   . Wears glasses     Past Surgical History:  Procedure Laterality Date  . BACK SURGERY    . BIOPSY  11/20/2018   Procedure: BIOPSY;  Surgeon: Irving Copas., MD;  Location: [place];  Service: Gastroenterology;;  . BIOPSY  02/04/2019  Procedure: BIOPSY;  Surgeon: Irving Copas., MD;  Location: [place];  Service: Gastroenterology;;  . CARDIOVERSION N/A 09/22/2014   Procedure: CARDIOVERSION;  Surgeon: Thayer Headings, MD;  Location: [place];  Service: Cardiovascular;  Laterality: N/A;  . COLONOSCOPY W/ POLYPECTOMY  2019  . ERCP N/A 02/04/2019   Procedure: ENDOSCOPIC RETROGRADE CHOLANGIOPANCREATOGRAPHY (ERCP);  Surgeon: Irving Copas., MD;  Location: [place];  Service: Gastroenterology;  Laterality: N/A;  . ESOPHAGOGASTRODUODENOSCOPY (EGD) WITH PROPOFOL N/A 11/20/2018   Procedure:  ESOPHAGOGASTRODUODENOSCOPY (EGD) WITH PROPOFOL;  Surgeon: Rush Landmark Telford Nab., MD;  Location: [place];  Service: Gastroenterology;  Laterality: N/A;  . ESOPHAGOGASTRODUODENOSCOPY (EGD) WITH PROPOFOL N/A 02/04/2019   Procedure: ESOPHAGOGASTRODUODENOSCOPY (EGD) WITH PROPOFOL;  Surgeon: Rush Landmark Telford Nab., MD;  Location: [place];  Service: Gastroenterology;  Laterality: N/A;  . FOREIGN BODY REMOVAL  11/20/2018   Procedure: FOREIGN BODY REMOVAL;  Surgeon: Rush Landmark Telford Nab., MD;  Location: [place];  Service: Gastroenterology;;  . [place]   not cervical, states had lesion on neck which was removed  . POSTERIOR LAMINECTOMY / DECOMPRESSION LUMBAR SPINE  10/2006   of L4, L5 and S1 with a 5-1 diskectomy, microdissection with the microscope/notes 10/24/2006  . SPLENECTOMY  2003  . UPPER ESOPHAGEAL ENDOSCOPIC ULTRASOUND (EUS) N/A 11/20/2018   Procedure: UPPER ESOPHAGEAL ENDOSCOPIC ULTRASOUND (EUS);  Surgeon: Irving Copas., MD;  Location: [place];  Service: Gastroenterology;  Laterality: N/A;  . UPPER ESOPHAGEAL ENDOSCOPIC ULTRASOUND (EUS) N/A 02/04/2019   Procedure: UPPER ESOPHAGEAL ENDOSCOPIC ULTRASOUND (EUS);  Surgeon: Irving Copas., MD;  Location: [place];  Service: Gastroenterology;  Laterality: N/A;  . VIDEO BRONCHOSCOPY WITH ENDOBRONCHIAL ULTRASOUND N/A 08/05/2019   Procedure: Video Bronchoscopy With Endobronchial Ultrasound;  Surgeon: [name], MD;  Location: [place];  Service: Thoracic;  Laterality: N/A;  . VIDEO BRONCHOSCOPY WITH RADIAL ENDOBRONCHIAL ULTRASOUND  08/05/2019   Procedure: Video Bronchoscopy With Radial Endobronchial Ultrasound;  Surgeon: [name], MD;  Location: [facility];  Service: Thoracic;;    Allergies: Prozac [fluoxetine hcl], Morphine and related, and Penicillins  Medications: Prior to Admission medications   Medication Sig Start Date End Date Taking? Authorizing Provider  amitriptyline  (ELAVIL) 10 MG tablet TAKE 1 TABLET BY MOUTH EVERYDAY AT BEDTIME Patient taking differently: Take 10 mg by mouth at bedtime.  02/18/19  Yes Lockamy, Timothy, DO  atorvastatin (LIPITOR) 40 MG tablet Take 0.5 tablets (20 mg total) by mouth daily at 6 PM. Patient taking differently: Take 40 mg by mouth daily at 6 PM.  11/23/18  Yes Beard, Samantha N, DO  famotidine (PEPCID) 20 MG tablet TAKE 1 TABLET BY MOUTH TWICE A DAY Patient taking differently: Take 20 mg by mouth every morning.  04/05/19  Yes Lockamy, Christia Reading, DO  feeding supplement, ENSURE ENLIVE, (ENSURE ENLIVE) LIQD Take 237 mLs by mouth 3 (three) times daily between meals. 11/23/18  Yes Beard, Samantha N, DO  gabapentin (NEURONTIN) 100 MG capsule TAKE 1 CAPSULE BY MOUTH THREE TIMES A DAY Patient taking differently: Take 100 mg by mouth 2 (two) times daily.  03/05/19  Yes [name], DO  HYDROcodone-acetaminophen (NORCO) 7.5-325 MG tablet Take 1 tablet by mouth 3 (three) times daily.  05/27/19  Yes [provider]  lipase/protease/amylase (CREON) 36000 UNITS CPEP capsule Take 36,000-72,000 capsules by mouth See admin instructions. 2 caps with each meal, 1 capsule with snacks 01/28/19  Yes [provider]  metFORMIN (GLUCOPHAGE) 1000 MG tablet Take 1 tablet (1,000 mg total) by mouth  2 (two) times daily with a meal. 03/05/19  Yes Lockamy, Timothy, DO  metoprolol tartrate (LOPRESSOR) 50 MG tablet Take 1.5 tablets (75 mg total) by mouth 2 (two) times daily. 05/15/19  Yes Lockamy, Timothy, DO  mometasone-formoterol (DULERA) 200-5 MCG/ACT AERO Inhale 2 puffs into the lungs 2 (two) times daily. 09/27/18  Yes [name], DO  Multiple Vitamin (MULTIVITAMIN WITH MINERALS) TABS tablet Take 1 tablet by mouth daily. 11/24/18  Yes Darrelyn Hillock N, DO  naproxen sodium (ALEVE) 220 MG tablet Take 880 mg by mouth 2 (two) times daily as needed (pain).   Yes [provider]  nitroGLYCERIN (NITROSTAT) 0.4 MG SL tablet Place 1 tablet (0.4 mg  total) under the tongue every 5 (five) minutes as needed for chest pain. 03/23/18  Yes Meccariello, Bernita Raisin, DO  ondansetron (ZOFRAN ODT) 4 MG disintegrating tablet Take 1 tablet (4 mg total) by mouth every 8 (eight) hours as needed for nausea or vomiting. 12/22/18  Yes Albrizze, Kaitlyn E, PA-C  polyethylene glycol (MIRALAX / GLYCOLAX) 17 g packet TAKE 17 G BY MOUTH DAILY. Patient taking differently: Take 17 g by mouth daily as needed for moderate constipation.  11/14/18  Yes Lockamy, Christia Reading, DO  prochlorperazine (COMPAZINE) 10 MG tablet Take 1 tablet (10 mg total) by mouth every 8 (eight) hours as needed for nausea or vomiting. 01/01/19  Yes Mansouraty, Telford Nab., MD  tamsulosin (FLOMAX) 0.4 MG CAPS capsule Take 1 capsule (0.4 mg total) by mouth daily. 06/12/19  Yes Lockamy, Timothy, DO  XARELTO 20 MG TABS tablet TAKE 1 TABLET BY MOUTH DAILY WITH SUPPER Patient taking differently: Take 20 mg by mouth daily with supper.  02/27/19  Yes Lockamy, Timothy, DO  Blood Glucose Monitoring Suppl (ONE TOUCH ULTRA 2) w/Device KIT Dispense one glucometer. Diagnosis Type 2 Diabetes. E11.9 06/13/18   Rory Percy, DO  glucose blood (ONE TOUCH ULTRA TEST) test strip Use check fasting blood glucose qAM. Type 2 Diabetes E11.9 10/01/18   [name], DO  Lancets (FREESTYLE) lancets Use as instructed 10/02/18   [name], DO  Lancets (ONETOUCH ULTRASOFT) lancets Use as instructed 10/02/18   [name], DO  pantoprazole (PROTONIX) 40 MG tablet Take 1 tablet (40 mg total) by mouth daily. Patient not taking: Reported on 07/31/2019 10/01/18   [name], DO  famotidine (PEPCID) 20 MG tablet Take 1 tablet (20 mg total) by mouth 2 (two) times daily. 03/27/19   [name], DO     Family History  Problem Relation Age of Onset  . Diabetes Mother   . Aneurysm Mother   . Stroke Mother   . Hypertension Mother   . Alcohol abuse Father   . Cancer Father        Lung  . Cancer Sister        Lung Cancer    . Post-traumatic stress disorder Brother   . Heart attack Neg Hx   . Colon cancer Neg Hx   . Liver cancer Neg Hx   . Rectal cancer Neg Hx   . Esophageal cancer Neg Hx   . Stomach cancer Neg Hx   . Inflammatory bowel disease Neg Hx     Social History   Socioeconomic History  . Marital status: Single    Spouse name: Not on file  . Number of children: 2  . Years of education: Not on file  . Highest education level: Not on file  Occupational History  . Occupation: retired  Tobacco Use  . Smoking  status: Current Every Day Smoker    Packs/day: 0.50    Years: 52.00    Pack years: 26.00    Types: Cigarettes    Start date: 04/09/1964  . Smokeless tobacco: Never Used  . Tobacco comment: down to one pack daily  Substance and Sexual Activity  . Alcohol use: Not Currently    Comment: QUIT IN 02/2018  . Drug use: No  . Sexual activity: Not Currently    Birth control/protection: Condom  Other Topics Concern  . 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     2 children one is Radio producer in transplant center   Single or divorced   Admits to alcohol abuse in past- no longer drinking- last drink 02/2018    Smoker   No drugs   Social Determinants of Health   Financial Resource Strain:   . Difficulty of Paying Living Expenses: Not on file  Food Insecurity:   . Worried About Charity fundraiser in the Last Year: Not on file  . Ran Out of Food in the Last Year: Not on file  Transportation Needs:   . Lack of Transportation (Medical): Not on file  . Lack of Transportation (Non-Medical): Not on file  Physical Activity:   . Days of Exercise per Week: Not on file  . Minutes of Exercise per Session: Not on file  Stress:   . Feeling of Stress : Not on file  Social Connections:   . Frequency of Communication with Friends and Family: Not on file  . Frequency of Social Gatherings with Friends and Family: Not  on file  . Attends Religious Services: Not on file  . Active Member of Clubs or Organizations: Not on file  . Attends Archivist Meetings: Not on file  . Marital Status: Not on file     Review of Systems: A 12 point ROS discussed and pertinent positives are indicated in the HPI above.  All other systems are negative.  Review of Systems  Constitutional: Positive for fatigue. Negative for fever.  Respiratory: Positive for cough and shortness of breath (baseline and acute worsening).   Cardiovascular: Negative for chest pain.  Gastrointestinal: Negative for abdominal pain, diarrhea, nausea and vomiting.  Genitourinary: Negative for dysuria.  Musculoskeletal: Negative for back pain.  Psychiatric/Behavioral: Negative for behavioral problems and confusion.    Vital Signs: BP 134/69   Pulse 64   Temp (!) 97.5 F (36.4 C) (Oral)   Resp (!) 24   Ht '6\' 3"'  (1.905 m)   Wt 157 lb 6.5 oz (71.4 kg)   SpO2 92%   BMI 19.67 kg/m   Physical Exam Vitals and nursing note reviewed.  Constitutional:      General: He is not in acute distress.    Appearance: Normal appearance.  HENT:     Mouth/Throat:     Mouth: Mucous membranes are moist.     Pharynx: Oropharynx is clear.  Cardiovascular:     Rate and Rhythm: Normal rate and regular rhythm.  Pulmonary:     Effort: Pulmonary effort is normal. No respiratory distress.     Breath sounds: Normal breath sounds.  Abdominal:     General: Abdomen is flat.     Palpations: Abdomen is soft.  Skin:    General: Skin is warm and dry.  Neurological:     General: No focal deficit present.  Mental Status: He is alert and oriented to person, place, and time. Mental status is at baseline.  Psychiatric:        Mood and Affect: Mood normal.        Behavior: Behavior normal.        Thought Content: Thought content normal.        Judgment: Judgment normal.      MD Evaluation Airway: WNL Heart: WNL Abdomen: WNL Chest/ Lungs: WNL ASA   Classification: 3 Mallampati/Airway Score: One   Imaging: DG Chest 2 View  Result Date: 08/03/2019 CLINICAL DATA:  Significant atrial fibrillation. Alcoholic pancreatitis. EXAM: CHEST - 2 VIEW COMPARISON:  August 01, 2019 FINDINGS: The mass in the left upper lobe with destruction of the left posterior third rib is similar in the interval. Bilateral pulmonary opacities are diffuse and similar in the interval. No other interval changes. IMPRESSION: 1. The left upper lobe mass with destruction of the posterior left third rib is stable. 2. Diffuse bilateral pulmonary infiltrates are similar in the interval. Electronically Signed   By: Dorise Bullion III M.D   On: 08/03/2019 10:16   CT Angio Chest PE W and/or Wo Contrast  Result Date: 07/30/2019 CLINICAL DATA:  Shortness of breath and near syncopal episode, mass lesion on recent chest x-ray EXAM: CT ANGIOGRAPHY CHEST CT ABDOMEN AND PELVIS WITH CONTRAST TECHNIQUE: Multidetector CT imaging of the chest was performed using the standard protocol during bolus administration of intravenous contrast. Multiplanar CT image reconstructions and MIPs were obtained to evaluate the vascular anatomy. Multidetector CT imaging of the abdomen and pelvis was performed using the standard protocol during bolus administration of intravenous contrast. CONTRAST:  164m OMNIPAQUE IOHEXOL 350 MG/ML SOLN COMPARISON:  Prior chest x-ray from earlier in the same day CT from 03/30/2018. FINDINGS: CTA CHEST FINDINGS Cardiovascular: Thoracic aorta demonstrates atherosclerotic calcifications without aneurysmal dilatation. No cardiac enlargement is noted. Mild coronary calcifications are noted. The pulmonary artery is well visualized within normal enhancement pattern. No filling defect to suggest pulmonary embolism is seen. Mediastinum/Nodes: Thoracic inlet shows a hypodensity within the left lobe of the thyroid. This measures approximately 14 mm in greatest dimension. The esophagus as  visualized is within normal limits. Scattered small mediastinal lymph nodes are noted primarily within the AP window. Small hilar adenopathy is noted bilaterally. Lungs/Pleura: In the left upper lobe there is a focal soft tissue mass lesion which measures approximately 11.8 x 8.2 cm in greatest AP and transverse dimensions respectively. It has some extension to the left hilum and there are associated changes involving the posterolateral aspect of the left third rib consistent with localized invasion and metastatic disease. There also appears to be soft tissue density between the third and fourth ribs laterally consistent with chest wall invasion. Emphysematous changes are noted with evidence of subpleural fibrosis similar to that seen on prior chest x-rays. 7 mm nodule is noted in the right upper lobe best seen on image number 76 of series 4. This is stable from a prior CT examination. Musculoskeletal: As described above there are destructive changes of the posterolateral aspect of the left third rib consistent with localized neoplastic invasion. No other specific rib abnormality is noted. The thoracic spine demonstrates degenerative change without lytic lesion or compression deformity. Review of the MIP images confirms the above findings. CT ABDOMEN and PELVIS FINDINGS Hepatobiliary: Gallbladder is well distended. Mild biliary ductal dilatation is seen secondary to changes of chronic pancreatitis in the head of the pancreas. These changes are similar  to that seen on prior abdominal CT. The liver demonstrates some fatty infiltration. No definitive mass is seen. Pancreas: Chronic changes are noted in the pancreas with pancreatic ductal dilatation stable from the prior exam. Multiple calcifications are seen in the head and uncinate process consistent with prior chronic pancreatitis. Previously seen pseudocyst in the head of the pancreas is not as well visualized on today's exam. Spleen: Surgically removed  Adrenals/Urinary Tract: Adrenal glands are within normal limits. The kidneys demonstrate a normal enhancement pattern bilaterally. No definitive calculi are seen. Delayed images demonstrate dense nephrograms without significant contrast enhancement. This is of uncertain significance but may be related to incomplete delayed images. No elevated creatinine is seen. The bladder is partially distended. Stomach/Bowel: No obstructive or inflammatory changes of the colon are seen. The appendix is not well visualized. No obstructive changes of the small bowel are noted. Stomach is within normal limits. Vascular/Lymphatic: Aortic atherosclerosis. No enlarged abdominal or pelvic lymph nodes. Reproductive: Prostate is unremarkable. Other: No abdominal wall hernia or abnormality. No abdominopelvic ascites. Musculoskeletal: Degenerative changes of lumbar spine are noted. No metastatic lesions are seen. Review of the MIP images confirms the above findings. IMPRESSION: Large left upper lobe mass lesion with apparent chest wall invasion in rib destruction as described consistent with a primary pulmonary neoplasm till proven otherwise. Associated mediastinal and hilar adenopathy is seen. Stable nodule in the right upper lobe unchanged from 2019. No evidence of pulmonary emboli. Hypodense lesion within the left lobe of the thyroid. No followup recommended (ref: J Am Coll Radiol. 2015 Feb;12(2): 143-50). Chronic changes in the pancreas consistent with chronic pancreatitis. Pancreatic ductal dilatation in biliary ductal dilatation is seen. Chronic changes in the abdomen and pelvis stable from previous exams. Electronically Signed   By: Inez Catalina M.D.   On: 07/30/2019 20:17   CT ABDOMEN PELVIS W CONTRAST  Result Date: 07/30/2019 CLINICAL DATA:  Shortness of breath and near syncopal episode, mass lesion on recent chest x-ray EXAM: CT ANGIOGRAPHY CHEST CT ABDOMEN AND PELVIS WITH CONTRAST TECHNIQUE: Multidetector CT imaging of the  chest was performed using the standard protocol during bolus administration of intravenous contrast. Multiplanar CT image reconstructions and MIPs were obtained to evaluate the vascular anatomy. Multidetector CT imaging of the abdomen and pelvis was performed using the standard protocol during bolus administration of intravenous contrast. CONTRAST:  140m OMNIPAQUE IOHEXOL 350 MG/ML SOLN COMPARISON:  Prior chest x-ray from earlier in the same day CT from 03/30/2018. FINDINGS: CTA CHEST FINDINGS Cardiovascular: Thoracic aorta demonstrates atherosclerotic calcifications without aneurysmal dilatation. No cardiac enlargement is noted. Mild coronary calcifications are noted. The pulmonary artery is well visualized within normal enhancement pattern. No filling defect to suggest pulmonary embolism is seen. Mediastinum/Nodes: Thoracic inlet shows a hypodensity within the left lobe of the thyroid. This measures approximately 14 mm in greatest dimension. The esophagus as visualized is within normal limits. Scattered small mediastinal lymph nodes are noted primarily within the AP window. Small hilar adenopathy is noted bilaterally. Lungs/Pleura: In the left upper lobe there is a focal soft tissue mass lesion which measures approximately 11.8 x 8.2 cm in greatest AP and transverse dimensions respectively. It has some extension to the left hilum and there are associated changes involving the posterolateral aspect of the left third rib consistent with localized invasion and metastatic disease. There also appears to be soft tissue density between the third and fourth ribs laterally consistent with chest wall invasion. Emphysematous changes are noted with evidence of subpleural fibrosis similar  to that seen on prior chest x-rays. 7 mm nodule is noted in the right upper lobe best seen on image number 76 of series 4. This is stable from a prior CT examination. Musculoskeletal: As described above there are destructive changes of the  posterolateral aspect of the left third rib consistent with localized neoplastic invasion. No other specific rib abnormality is noted. The thoracic spine demonstrates degenerative change without lytic lesion or compression deformity. Review of the MIP images confirms the above findings. CT ABDOMEN and PELVIS FINDINGS Hepatobiliary: Gallbladder is well distended. Mild biliary ductal dilatation is seen secondary to changes of chronic pancreatitis in the head of the pancreas. These changes are similar to that seen on prior abdominal CT. The liver demonstrates some fatty infiltration. No definitive mass is seen. Pancreas: Chronic changes are noted in the pancreas with pancreatic ductal dilatation stable from the prior exam. Multiple calcifications are seen in the head and uncinate process consistent with prior chronic pancreatitis. Previously seen pseudocyst in the head of the pancreas is not as well visualized on today's exam. Spleen: Surgically removed Adrenals/Urinary Tract: Adrenal glands are within normal limits. The kidneys demonstrate a normal enhancement pattern bilaterally. No definitive calculi are seen. Delayed images demonstrate dense nephrograms without significant contrast enhancement. This is of uncertain significance but may be related to incomplete delayed images. No elevated creatinine is seen. The bladder is partially distended. Stomach/Bowel: No obstructive or inflammatory changes of the colon are seen. The appendix is not well visualized. No obstructive changes of the small bowel are noted. Stomach is within normal limits. Vascular/Lymphatic: Aortic atherosclerosis. No enlarged abdominal or pelvic lymph nodes. Reproductive: Prostate is unremarkable. Other: No abdominal wall hernia or abnormality. No abdominopelvic ascites. Musculoskeletal: Degenerative changes of lumbar spine are noted. No metastatic lesions are seen. Review of the MIP images confirms the above findings. IMPRESSION: Large left upper  lobe mass lesion with apparent chest wall invasion in rib destruction as described consistent with a primary pulmonary neoplasm till proven otherwise. Associated mediastinal and hilar adenopathy is seen. Stable nodule in the right upper lobe unchanged from 2019. No evidence of pulmonary emboli. Hypodense lesion within the left lobe of the thyroid. No followup recommended (ref: J Am Coll Radiol. 2015 Feb;12(2): 143-50). Chronic changes in the pancreas consistent with chronic pancreatitis. Pancreatic ductal dilatation in biliary ductal dilatation is seen. Chronic changes in the abdomen and pelvis stable from previous exams. Electronically Signed   By: Inez Catalina M.D.   On: 07/30/2019 20:17   DG CHEST PORT 1 VIEW  Result Date: 08/01/2019 CLINICAL DATA:  69 year old male with hypoxia. Oxygen desaturation. Shortness of breath. EXAM: PORTABLE CHEST 1 VIEW COMPARISON:  Chest x-ray 07/30/2019.  Chest CTA 07/30/2019. FINDINGS: Previously noted left upper lobe mass appears larger on today's examination, which could represent growth of the lesion, or may reflect surrounding postobstructive changes. This lesion is again invasive into the left chest wall with gross destruction of the posterolateral aspect of the left third rib. Enlarging small left pleural effusion which appears partially loculated in the periphery of the left base. Increasing areas of ill-defined opacity and interstitial prominence throughout the lungs bilaterally (left greater than right), likely reflects evolving multilobar pneumonia. Pulmonary vasculature is partially obscured, but there is no evidence to suggest frank pulmonary edema. Heart size is normal. Aortic atherosclerosis. IMPRESSION: 1. Interval worsening of aeration throughout the lungs bilaterally, concerning for developing multilobar pneumonia. 2. Enlargement of mass in left upper lobe which may reflect progression of the  underlying neoplasm but also likely represents worsening adjacent  postobstructive changes. Associated with this there is an enlarging small left partially loculated pleural effusion. Electronically Signed   By: Vinnie Langton M.D.   On: 08/01/2019 08:12   DG Chest Portable 1 View  Result Date: 07/30/2019 CLINICAL DATA:  Hypotension. EXAM: PORTABLE CHEST 1 VIEW COMPARISON:  December 29, 2018 FINDINGS: Mild, chronic appearing increased interstitial lung markings are noted. A 10.7 cm x 8.7 cm masslike opacity is seen within the left upper lobe. This is increased in severity when compared to the prior study. There is apparent cortical destruction of the adjacent portion of the third left rib. This represents a new finding compared to the prior exam. There is no evidence of a pleural effusion or pneumothorax. The heart size and mediastinal contours are within normal limits. IMPRESSION: 1. Large opacity within the left upper lobe suspicious for the presence of a large lung mass. The presence of adjacent cortical destruction of the third left rib is suggestive of malignant properties. Correlation with chest CT is recommended. Electronically Signed   By: Virgina Norfolk M.D.   On: 07/30/2019 17:57   Myocardial Perfusion Imaging  Result Date: 07/15/2019  Nuclear stress EF: 61%.  There was no ST segment deviation noted during stress.  The study is normal.  This is a low risk study.  The left ventricular ejection fraction is normal (55-65%).  No prior study for comparison.    ECHOCARDIOGRAM COMPLETE  Result Date: 08/02/2019   ECHOCARDIOGRAM REPORT   Patient Name:   Timothy Martinez Date of Exam: 08/02/2019 Medical Rec #:  [id number]       Height:       75.0 in Accession #:    [id number]      Weight:       175.0 lb Date of Birth:  04/28/1951        BSA:          2.07 m Patient Age:    26 years        BP:           125/73 mmHg Patient Gender: M               HR:           76 bpm. Exam Location:  Inpatient Procedure: 2D Echo Indications:    Dyspnea 786.09/R06.00  History:         Patient has prior history of Echocardiogram examinations, most                 recent 12/30/2014. COPD, Arrythmias:Atrial Fibrillation; Risk                 Factors:Diabetes. 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  Sonographer:    Clayton Lefort RDCS (AE) Referring Phys: [place]  1. Technically diffcult study. Left ventricular ejection fraction, by visual estimation, is grossly 40 to 45%. The left ventricle has mild to moderately decreased function. There is mildly increased left ventricular hypertrophy. Would consider limited echo with contrast to better evaluate systolic function  2. Global right ventricle has normal systolic function.The right ventricular size is normal. No increase in right ventricular wall thickness.  3. Left atrial size was mildly dilated.  4. Right atrial size was normal.  5. The mitral valve is normal in structure. No evidence of mitral valve regurgitation.  6. The tricuspid valve is normal in structure.  7. The aortic valve is normal in structure. Aortic valve regurgitation is not visualized. No evidence of aortic valve sclerosis or stenosis.  8. The pulmonic valve was not well visualized. Pulmonic valve regurgitation is not visualized.  9. TR signal is inadequate for assessing pulmonary artery systolic pressure. 10. The inferior vena cava is dilated in size with <50% respiratory variability, suggesting right atrial pressure of 15 mmHg. FINDINGS  Left Ventricle: Left ventricular ejection fraction, by visual estimation, is 40 to 45%. The left ventricle has mild to moderately decreased function. The left ventricle demonstrates global hypokinesis. There is mildly increased left ventricular hypertrophy. Right Ventricle: The right ventricular size is normal. No increase in right ventricular wall thickness. Global RV systolic function is has normal systolic function. Left Atrium: Left atrial size was mildly dilated. Right Atrium: Right atrial size  was normal in size Pericardium: There is no evidence of pericardial effusion. Mitral Valve: The mitral valve is normal in structure. No evidence of mitral valve regurgitation. MV peak gradient, 5.9 mmHg. Tricuspid Valve: The tricuspid valve is normal in structure. Tricuspid valve regurgitation is not demonstrated. Aortic Valve: The aortic valve is normal in structure. Aortic valve regurgitation is not visualized. The aortic valve is structurally normal, with no evidence of sclerosis or stenosis. Aortic valve mean gradient measures 3.0 mmHg. Aortic valve peak gradient measures 5.1 mmHg. Aortic valve area, by VTI measures 2.53 cm. Pulmonic Valve: The pulmonic valve was not well visualized. Pulmonic valve regurgitation is not visualized. Pulmonic regurgitation is not visualized. Aorta: The aortic root and ascending aorta are structurally normal, with no evidence of dilitation. Venous: The inferior vena cava is dilated in size with less than 50% respiratory variability, suggesting right atrial pressure of 15 mmHg. IAS/Shunts: The atrial septum is grossly normal.  LEFT VENTRICLE PLAX 2D LVIDd:         4.74 cm  Diastology LVIDs:         4.17 cm  LV e' lateral:   9.90 cm/s LV PW:         1.06 cm  LV E/e' lateral: 9.8 LV IVS:        1.08 cm  LV e' medial:    8.05 cm/s LVOT diam:     2.20 cm  LV E/e' medial:  12.0 LV SV:         27 ml LV SV Index:   13.29 LVOT Area:     3.80 cm  RIGHT VENTRICLE             IVC RV S prime:     12.80 cm/s  IVC diam: 2.47 cm TAPSE (M-mode): 2.9 cm LEFT ATRIUM           Index       RIGHT ATRIUM           Index LA diam:      3.60 cm 1.74 cm/m  RA Area:     13.90 cm LA Vol (A2C): 48.8 ml 23.54 ml/m RA Volume:   32.90 ml  15.87 ml/m LA Vol (A4C): 70.8 ml 34.15 ml/m  AORTIC VALVE AV Area (Vmax):    2.63 cm AV Area (Vmean):   2.32 cm AV Area (VTI):     2.53 cm AV Vmax:           113.00 cm/s AV Vmean:          79.800 cm/s AV VTI:  0.221 m AV Peak Grad:      5.1 mmHg AV Mean Grad:       3.0 mmHg LVOT Vmax:         78.30 cm/s LVOT Vmean:        48.700 cm/s LVOT VTI:          0.147 m LVOT/AV VTI ratio: 0.67  AORTA Ao Root diam: 3.00 cm Ao Asc diam:  3.40 cm MITRAL VALVE MV Area (PHT): 6.27 cm             SHUNTS MV Peak grad:  5.9 mmHg             Systemic VTI:  0.15 m MV Mean grad:  2.0 mmHg             Systemic Diam: 2.20 cm MV Vmax:       1.21 m/s MV Vmean:      61.8 cm/s MV VTI:        0.31 m MV PHT:        35.09 msec MV Decel Time: 121 msec MV E velocity: 96.80 cm/s 103 cm/s MV A velocity: 49.20 cm/s 70.3 cm/s MV E/A ratio:  1.97       1.5  Oswaldo Milian MD Electronically signed by Oswaldo Milian MD Signature Date/Time: 08/02/2019/8:03:44 PM    Final    US Abdomen Limited RUQ  Result Date: 08/02/2019 CLINICAL DATA:  Increasing abdominal pain EXAM: ULTRASOUND ABDOMEN LIMITED RIGHT UPPER QUADRANT COMPARISON:  CT from 07/30/2019 FINDINGS: Gallbladder: Gallbladder is significantly distended with mild wall thickening. No cholelithiasis or pericholecystic fluid is seen. Negative sonographic Murphy's sign is noted. Common bile duct: Diameter: 6 mm. Liver: No focal lesion identified. Within normal limits in parenchymal echogenicity. Portal vein is patent on color Doppler imaging with normal direction of blood flow towards the liver. Mild biliary ductal dilatation is seen consistent with the changes of chronic pancreatitis seen on prior CT. The overall appearance is stable. Other: None. IMPRESSION: Distended gallbladder and mildly the dilated intrahepatic biliary ductal system. These changes are likely related to chronic changes in the head of the pancreas from previous pancreatitis. The overall appearance is similar to that seen on the prior exam. Electronically Signed   By: Inez Catalina M.D.   On: 08/02/2019 21:56   DG C-ARM BRONCHOSCOPY  Result Date: 08/05/2019 C-ARM BRONCHOSCOPY: Fluoroscopy was utilized by the requesting physician.  No radiographic interpretation.     Labs:  CBC: Recent Labs    08/04/19 0934 08/05/19 0354 08/06/19 0729 08/07/19 0324  WBC 18.3* 17.2* 13.6* 18.9*  HGB 8.4* 8.4* 7.9* 8.3*  HCT 25.2* 24.7* 24.3* 24.7*  PLT 363 347 364 355    COAGS: Recent Labs    11/19/18 2050 11/21/18 0442 07/31/19 1620 08/01/19 1806 08/03/19 0037  INR  --   --  2.1*  --  1.4*  APTT 53* 59* 49* 71*  --     BMP: Recent Labs    08/04/19 0007 08/05/19 0354 08/06/19 0729 08/07/19 0324  NA 139 141 142 141  K 3.5 4.3 4.1 4.3  CL 105 104 104 104  CO2 '26 30 30 29  ' GLUCOSE 177* 106* 182* 230*  BUN '16 13 22 23  ' CALCIUM 8.1* 8.2* 8.4* 8.3*  CREATININE 0.75 0.81 0.71 0.77  GFRNONAA >60 >60 >60 >60  GFRAA >60 >60 >60 >60    LIVER FUNCTION TESTS: Recent Labs    08/04/19 0007 08/05/19 0354 08/06/19 0729 08/07/19 0324  BILITOT  0.6 0.7 0.4 0.5  AST 40 31 35 40  ALT '19 18 18 22  ' ALKPHOS 326* 244* 221* 179*  PROT 5.2* 5.5* 5.6* 5.5*  ALBUMIN 1.4* 1.4* 1.4* 1.5*    TUMOR MARKERS: No results for input(s): AFPTM, CEA, CA199, CHROMGRNA in the last 8760 hours.  Assessment and Plan: Lung mass Patient with worsening shortness of breath from baseline. Hx of COPD on chronic O2.  Underwent bronchoscopy 1/11, however non-diagnostic. IR consulted for percutaenous biopsy at the request of Dr. Roxan Hockey.   Procedure discussed with patient at bedside.  He is agreeable  Last dose Xarelto was 1/12.  Held dose for today.   INR ordered NPO p MN.   Risks and benefits of CT guided lung nodule biopsy was discussed with the patient including, but not limited to bleeding, hemoptysis, respiratory failure requiring intubation, infection, pneumothorax requiring chest tube placement, stroke from air embolism or even death.  All of the patient's questions were answered and the patient is agreeable to proceed.  Consent signed and in chart.  Thank you for this interesting consult.  I greatly enjoyed meeting Timothy Martinez and look forward to  participating in their care.  A copy of this report was sent to the requesting provider on this date.  Electronically Signed: Docia Barrier, PA 08/07/2019, 5:12 PM   I spent a total of 40 Minutes    in face to face in clinical consultation, greater than 50% of which was counseling/coordinating care for lung mass.

## 2019-08-07 NOTE — Progress Notes (Signed)
PT Cancellation Note  Patient Details Name: Timothy Martinez MRN: [id number] DOB: 1950-10-11   Cancelled Treatment:    Reason Eval/Treat Not Completed: Patient declined, no reason specified attempted to see patient, he is very polite but declines PT this morning-  "I am not a morning person". Will try attempt again later this afternoon if time/schedule allow.    Windell Norfolk, DPT, PN1   Supplemental Physical Therapist [facility]    Pager [phone] Acute Rehab Office [phone]

## 2019-08-07 NOTE — Progress Notes (Signed)
Family Medicine Teaching Service Daily Progress Note Intern Pager: [phone]  Patient name: Timothy Martinez Medical record number: [id number] Date of birth: [birth year] Age: 69 y.o. Gender: male  Primary Care Provider: [name], DO Consultants: CVTS   Code Status: Full   Pt Overview and Major Events to Date:  07/30/19: Admitted, CT Lung Mass with rib infiltration  08/06/19: Bronchoscopy   Assessment and Plan: Timothy Martinez is a 69 y.o. male who presented dizziness and dyspnea. PMH is significant for chronic alcoholic pancreatitis, tobacco abuse, GERD, COPD, type II diabetes, AFIB, HFpEF, lumbar back pain, hyperlipidemia    Acute hypoxic respiratory failure  Pneumonia (CAP vs. Obstructive)  Large lung mass  Satting well on 4 L [place]. Continue to wean oxygen as tolerated. Rales appreciated on the left.  Patient satted well on 3L with PT yesterday.  Patient may need O2 at discharge. Will touch base with CVTS regarding CT-guided biospy plans.   WBC up trending 18.9 with left shift. Likely due to steroids given during procedure on 1/11. Patient continues to be afebrile overnight. ALP down-trending. Home oxygen orders. Patient needs desaturation test prior to discharge.  - CVTS following, appreciate recommendations: s/p Bronch 1/11  - Palliative following, appreciate recommendations: continue palliative outpatient  - Doxycycline (1/7 - 1/9) - Levaquin 750 mg  (1/9 - ) - Oxycodone 5mg  q6h   - AM CBC w/ diff  - Continuous cardiac monitoring and pulse oximetry  - PT/OT : HH PT/ OT, shower chair ordered  - Order home oxygen  - Ambulate with pulse ox prior to discharge   A. Fibrillation, now rate controlled HR well controlled (63-76) with oral amiodarone 400mg  BID (for one week and then 200 mg daily thereafter) and home metoprolol 75 mg BID.  DOAC resumed. Magnesium 1.8, will replete.  - Cardiology signed off, appreciate recommendations  -Continue cardiac monitoring, pulse oximetry  -metoprolol 75 mg twice daily -Amiodorone 400 mg BID  (1/9 -  1/15) , then 200 mg daily  -2.5 mg IV Metoprolol PRN  for sustained HR >120  -Continue Xarelto    Anemia Hemoglobin stable at 8.3.  - Daily CBC   AKI., resolved  - Monitor AM CMP   Hypomagnesemia Mg 1.7. Replete as necessary. Goal 2.0    Chronic pancreatitis 2/2 previous ETOH excess Chronic abdominal pain unchanged. RUQ u/s shows chronic biliary dilitation due to chronic pancreatitis. No acute findings.  Patient follow with [facility]. CT abdomen 07/30/19: Stable pancreatic ductal dilatation and multiple calcifications. Previously seen pseudocyst in head of the pancreas poorly visualized. Home meds: Ensure supplements TID between meals, Norco/Vicodin 5-325 mg every 6 hourly, Creon 2 capsules 5 times daily, Zofran 4 mg every 8 hourly, Compazine 10 mg every 8 hourly -Continue home meds -Hep B/HepC/Hep A: neg for infection  Diabetes Mellitus Fasting glucose 154  today, last A1c 6.1 10/31/2017 Home meds: Metformin 1 g BID -Hold Metformin, no need for sliding scale at present -Monitor CBGs daily with BMP  Left ear ulcer See media for image.  Concern for skin cancer given chronic nature of this ulceration and location on a frequently sun exposed area. Patient reports wound has been present for 2-3 months.  -Xerform dressing, per Wound RN consultation  -Dermatology outpatient referral   Hyperlipidemia Home meds: Atorvastatin 20 mg once daily -Lipid panel  -Continue statin  HFpEF  Echocardiogram w/ limited study shows LVEF 40-45%. Follows with heart care, last seen in 2019 without concern. EF 55-60%with G1 DD on echo  in 12/2014. - Cardiology following, appreciate recommendations -Monitor fluid status   COPD Continue to monitor respiratory status. Home meds: Dulera BID  -Continue Dulera -Albuterol PRN   GERD Home meds: Pantoprazole 40mg  once daily -Continue Protinix  -Pepcid 20 mg BID   Tobacco use Smokes 1  ppd since age 32, patient has cut down from 3 ppd -14 mg nicotine patch as needed  Chronic lower back pain, chronic, stable Currently asymptomatic. Home meds: hydrocodone, will continue - Scheduled Tylenol 650 mg q6h  - Oxycodone 5mg  BID   Previous alcohol use: Stable Last drink was 1.5 years ago.  Encouraged patient to keep this up.  Hx of colonic polyps Three sessile polpys resected during colonoscopy in 2019. No evidence of malignancy   Constipation Home meds: MiraLAX, -Continue home meds  FEN/GI: Regular diet, Ensure TID, Multivitamin PPx: Heparin gtt  Disposition: likely home with HH  Subjective:  Timothy Martinez had no significant overnight events.   Objective: Temp:  [97.4 F (36.3 C)-98.2 F (36.8 C)] 97.4 F (36.3 C) (01/13 0538) Pulse Rate:  [63-76] 68 (01/13 1000) Resp:  [18-24] 24 (01/13 1000) BP: (127-139)/(69-72) 127/72 (01/13 1000) SpO2:  [91 %-97 %] 95 % (01/13 1000) Weight:  [71.4 kg] 71.4 kg (01/13 0538)   Physical Exam:  GEN:  Pleasant male in no acute distress  CV: regular rate and rhythm, no murmurs appreciated RESP: rales appreciated at the left base, no increased work of breathing, no wheezing, no rhonchi  ABD: Bowel sounds present. Soft, Nontender, Nondistended MSK: no lower extremity edema, normal ROM  SKIN: warm, dry NEURO: grossly normal, moves all extremities appropriately PSYCH: Normal affect and thought content      Laboratory: Recent Labs  Lab 08/05/19 0354 08/06/19 0729 08/07/19 0324  WBC 17.2* 13.6* 18.9*  HGB 8.4* 7.9* 8.3*  HCT 24.7* 24.3* 24.7*  PLT 347 364 355   Recent Labs  Lab 08/05/19 0354 08/06/19 0729 08/07/19 0324  NA 141 142 141  K 4.3 4.1 4.3  CL 104 104 104  CO2 30 30 29   BUN 13 22 23   CREATININE 0.81 0.71 0.77  CALCIUM 8.2* 8.4* 8.3*  PROT 5.5* 5.6* 5.5*  BILITOT 0.7 0.4 0.5  ALKPHOS 244* 221* 179*  ALT 18 18 22   AST 31 35 40  GLUCOSE 106* 182* 230*      Imaging/Diagnostic Tests:  No results found.  Timothy Hensen, DO PGY-1, [facility] 08/07/2019 10:58 AM

## 2019-08-07 NOTE — Progress Notes (Signed)
Oncology Nurse Navigator Documentation  Oncology Nurse Navigator Flowsheets 08/07/2019  Navigator Location [facility]  Referral Date to RadOnc/MedOnc 08/05/2019  Navigator Encounter Type Other/I followed up to see if Timothy Martinez has been discharged from hospital and as of now he has not.  I updated Dr. Julien Nordmann that patient is still inpatient.    Patient Visit Type Inpatient  Barriers/Navigation Needs Coordination of Care  Interventions Coordination of Care  Acuity Level 2-Minimal Needs (1-2 Barriers Identified)  Coordination of Care Other  Time Spent with Patient 15

## 2019-08-08 ENCOUNTER — Inpatient Hospital Stay (HOSPITAL_COMMUNITY): Payer: Medicare Other

## 2019-08-08 DIAGNOSIS — R29898 Other symptoms and signs involving the musculoskeletal system: Secondary | ICD-10-CM

## 2019-08-08 LAB — CBC WITH DIFFERENTIAL/PLATELET
Abs Immature Granulocytes: 0.42 10*3/uL — ABNORMAL HIGH (ref 0.00–0.07)
Basophils Absolute: 0 10*3/uL (ref 0.0–0.1)
Basophils Relative: 0 %
Eosinophils Absolute: 0.1 10*3/uL (ref 0.0–0.5)
Eosinophils Relative: 0 %
HCT: 27.6 % — ABNORMAL LOW (ref 39.0–52.0)
Hemoglobin: 9.1 g/dL — ABNORMAL LOW (ref 13.0–17.0)
Immature Granulocytes: 2 %
Lymphocytes Relative: 10 %
Lymphs Abs: 2 10*3/uL (ref 0.7–4.0)
MCH: 27.3 pg (ref 26.0–34.0)
MCHC: 33 g/dL (ref 30.0–36.0)
MCV: 82.9 fL (ref 80.0–100.0)
Monocytes Absolute: 1.4 10*3/uL — ABNORMAL HIGH (ref 0.1–1.0)
Monocytes Relative: 6 %
Neutro Abs: 17.2 10*3/uL — ABNORMAL HIGH (ref 1.7–7.7)
Neutrophils Relative %: 82 %
Platelets: 400 10*3/uL (ref 150–400)
RBC: 3.33 MIL/uL — ABNORMAL LOW (ref 4.22–5.81)
RDW: 17.1 % — ABNORMAL HIGH (ref 11.5–15.5)
WBC: 21.2 10*3/uL — ABNORMAL HIGH (ref 4.0–10.5)
nRBC: 1.9 % — ABNORMAL HIGH (ref 0.0–0.2)

## 2019-08-08 LAB — COMPREHENSIVE METABOLIC PANEL
ALT: 23 U/L (ref 0–44)
AST: 33 U/L (ref 15–41)
Albumin: 1.6 g/dL — ABNORMAL LOW (ref 3.5–5.0)
Alkaline Phosphatase: 185 U/L — ABNORMAL HIGH (ref 38–126)
Anion gap: 8 (ref 5–15)
BUN: 14 mg/dL (ref 8–23)
CO2: 29 mmol/L (ref 22–32)
Calcium: 8.1 mg/dL — ABNORMAL LOW (ref 8.9–10.3)
Chloride: 101 mmol/L (ref 98–111)
Creatinine, Ser: 0.71 mg/dL (ref 0.61–1.24)
GFR calc Af Amer: >60 mL/min (ref >60)
GFR calc non Af Amer: >60 mL/min (ref >60)
Glucose, Bld: 132 mg/dL — ABNORMAL HIGH (ref 70–99)
Potassium: 3.9 mmol/L (ref 3.5–5.1)
Sodium: 138 mmol/L (ref 135–145)
Total Bilirubin: 0.7 mg/dL (ref 0.3–1.2)
Total Protein: 5.7 g/dL — ABNORMAL LOW (ref 6.5–8.1)

## 2019-08-08 LAB — GLUCOSE, CAPILLARY: Glucose-Capillary: 126 mg/dL — ABNORMAL HIGH (ref 70–99)

## 2019-08-08 MED ORDER — MIDAZOLAM HCL 2 MG/2ML IJ SOLN
INTRAMUSCULAR | Status: AC | PRN
  Administered 2019-08-08 (×2): 0.5 mg via INTRAVENOUS

## 2019-08-08 MED ORDER — LIDOCAINE HCL 1 % IJ SOLN
INTRAMUSCULAR | Status: AC
  Filled 2019-08-08: qty 20

## 2019-08-08 MED ORDER — FENTANYL CITRATE (PF) 100 MCG/2ML IJ SOLN
INTRAMUSCULAR | Status: AC
  Filled 2019-08-08: qty 2

## 2019-08-08 MED ORDER — LEVOFLOXACIN 750 MG PO TABS: 750.0000 mg | ORAL_TABLET | Freq: Every day | ORAL | 0 refills | Status: AC

## 2019-08-08 MED ORDER — MIDAZOLAM HCL 2 MG/2ML IJ SOLN
INTRAMUSCULAR | Status: AC
  Filled 2019-08-08: qty 2

## 2019-08-08 MED ORDER — OXYCODONE HCL 5 MG PO TABS: 5.0000 mg | ORAL_TABLET | Freq: Four times a day (QID) | ORAL | 0 refills | Status: AC | PRN

## 2019-08-08 MED ORDER — AMIODARONE HCL 200 MG PO TABS: ORAL_TABLET | ORAL | 0 refills | Status: DC

## 2019-08-08 MED ORDER — FENTANYL CITRATE (PF) 100 MCG/2ML IJ SOLN
INTRAMUSCULAR | Status: AC | PRN
  Administered 2019-08-08 (×2): 25 ug via INTRAVENOUS

## 2019-08-08 MED FILL — levoFLOXacin 750 MG TABS: 750 | 5 days supply | Qty: 5 | Fill #0

## 2019-08-08 MED FILL — oxyCODONE HCL 5 MG TABS: 5 | 7 days supply | Qty: 30 | Fill #0

## 2019-08-08 NOTE — Progress Notes (Signed)
Patient's oxygen saturation decreased to 87-88% on 4L [place], placed patient on 10L [place] and raised HOB. Patient's oxygen saturation is currently at 94%, will attempt to titrate. Respiratory therapist was made aware. Patient denies shortness of breath. Will continue to monitor.   Elaina Hoops, RN

## 2019-08-08 NOTE — Procedures (Signed)
LUL mass with left 3rd rib involvement  S/p CT bx of the left 3rd rib / chest wall destructive lesion  No comp Stable ebl min Path pending Full report in pacs

## 2019-08-08 NOTE — Progress Notes (Signed)
Patient has been confused/agitated since return from IR. Patient occasionally able to state he is in the hospital but at other times states "I am somewhere off [employer] Patient drowsy but arouses to voice. Patient refusing most nursing care and frequently states "leave me alone." Patient requiring 8L of oxygen via HFNC due to drowsiness.  Per patient's daughter, [name], patient has been know to become confused for a short time after receiving IV sedation.  Spoke with Dr. Susa Simmonds via phone regarding new orders to obtain EKG; updated Dr. Susa Simmonds about patient's confusion. Dr. Susa Simmonds states to update MD if patient's mental status does not improve this evening.

## 2019-08-08 NOTE — Progress Notes (Signed)
Family Medicine Teaching Service Daily Progress Note Intern Pager: [phone]  Patient name: Timothy Martinez Medical record number: [id number] Date of birth: March 23, 1951 Age: 69 y.o. Gender: male  Primary Care Provider: [name], DO Consultants: CVTS   Code Status: Full   Pt Overview and Major Events to Date:  07/30/19: Admitted, CT Lung Mass with rib infiltration  08/06/19: Bronchoscopy   Assessment and Plan: Timothy Martinez is a 69 y.o. male who presented dizziness and dyspnea. PMH is significant for chronic alcoholic pancreatitis, tobacco abuse, GERD, COPD, type II diabetes, AFIB, HFpEF, lumbar back pain, hyperlipidemia    Acute hypoxic respiratory failure  Pneumonia (CAP vs. Obstructive)  Large lung mass  Patient satting well on 4 L nasal cannula.  Was unable to rate 2 L trial yesterday.  Home oxygen ordered.  Patient to have CT-guided biopsy today.  Leukocytosis uptrending WBC 21.2 from 18.9 today.  Patient remains afebrile.  Leukocytosis likely due to steroids given during bronchoscopy.  Patient needs desaturation test prior to discharge.  Patient medically stable for discharge today if home oxygen is set up and he gets biopsy. - CVTS consulted, appreciate recommendations: s/p Bronch 1/11  - Palliative following, appreciate recommendations: continue palliative outpatient  - Doxycycline (1/7 - 1/9) - Levaquin 750 mg  (1/9 - )  - Oxycodone 5mg  q6h   - AM CBC w/ diff  - Continuous cardiac monitoring and pulse oximetry  - PT/OT : HH PT/ OT, shower chair ordered  - Order home oxygen  - Ambulate with pulse ox prior to discharge   A. Fibrillation, now rate controlled HR well controlled (64-70) with oral amiodarone 400mg  BID (for one week and then 200 mg daily thereafter) and home metoprolol 75 mg BID.  Xarelto held for procedure.   - Cardiology signed off, appreciate recommendations  -Continue cardiac monitoring, pulse oximetry -metoprolol 75 mg twice daily -Amiodorone 400 mg  BID  (1/9 -  1/15) , then 200 mg daily  -2.5 mg IV Metoprolol PRN  for sustained HR >120  -Restart Xarelto after procedure   Anemia Hemoglobin stable at 9.1 from 8.3.  - Daily CBC   AKI., resolved  - Monitor AM CMP   Hypomagnesemia Mg 1.7. Replete as necessary. Goal 2.0    Chronic pancreatitis 2/2 previous ETOH excess Chronic abdominal pain unchanged. RUQ u/s shows chronic biliary dilitation due to chronic pancreatitis. No acute findings.  Patient follow with [facility]. CT abdomen 07/30/19: Stable pancreatic ductal dilatation and multiple calcifications. Previously seen pseudocyst in head of the pancreas poorly visualized. Home meds: Ensure supplements TID between meals, Norco/Vicodin 5-325 mg every 6 hourly, Creon 2 capsules 5 times daily, Zofran 4 mg every 8 hourly, Compazine 10 mg every 8 hourly -Continue home meds -Hep B/HepC/Hep A: neg for infection  Diabetes Mellitus Fasting glucose 126  today, last A1c 6.1 10/31/2017 Home meds: Metformin 1 g BID -Hold Metformin, no need for sliding scale at present -Monitor CBGs daily with BMP  Left ear ulcer See media for image.  Concern for skin cancer given chronic nature of this ulceration and location on a frequently sun exposed area. Patient reports wound has been present for 2-3 months.  -Xerform dressing, per Wound RN consultation  -Dermatology outpatient referral, placed   Hyperlipidemia Home meds: Atorvastatin 20 mg once daily -Lipid panel  -Continue statin  HFpEF  Echocardiogram w/ limited study shows LVEF 40-45%. Follows with heart care, last seen in 2019 without concern. EF 55-60%with G1 DD on echo in  12/2014. - Cardiology following, appreciate recommendations -Monitor fluid status   COPD Continue to monitor respiratory status. Home meds: Dulera BID  -Continue Dulera -Albuterol PRN   GERD Home meds: Pantoprazole 40mg  once daily -Continue Protinix  -Pepcid 20 mg BID   Tobacco use Smokes 1 ppd since age 53,  patient has cut down from 3 ppd -14 mg nicotine patch as needed  Chronic lower back pain, chronic, stable Currently asymptomatic. Home meds: hydrocodone, will continue - Scheduled Tylenol 650 mg q6h  - Oxycodone 5mg  BID   Previous alcohol use: Stable Last drink was 1.5 years ago.  Encouraged patient to keep this up.  Hx of colonic polyps Three sessile polpys resected during colonoscopy in 2019. No evidence of malignancy   Constipation Home meds: MiraLAX, -Continue home meds  FEN/GI: Regular diet, Ensure TID, Multivitamin PPx: Heparin gtt  Disposition: likely home with [facility], tomorrow   Subjective:  Timothy Martinez feels well this morning and is without complaints.  No significant overnight events.  Objective: Temp:  [97.5 F (36.4 C)-98 F (36.7 C)] 98 F (36.7 C) (01/14 0410) Pulse Rate:  [64-83] 76 (01/14 1145) Resp:  [13-23] 20 (01/14 1145) BP: (117-139)/(62-76) 139/73 (01/14 1145) SpO2:  [90 %-98 %] 98 % (01/14 1145) Weight:  [69.8 kg] 69.8 kg (01/14 0412)   Physical Exam:  GEN: pleasant male, in no acute distress, smiles in conversation CV: regular rate and rhythm, no murmurs appreciated RESP: no increased work of breathing, clear to ascultation bilaterally with no crackles, wheezes, or rhonchi  ABD: Bowel sounds present. Soft, Nontender, Nondistended. MSK: no lower extremity edema, or cyanosis noted SKIN: warm, dry NEURO: grossly normal, moves all extremities appropriately PSYCH: Normal affect and thought content       Laboratory: Recent Labs  Lab 08/06/19 0729 08/07/19 0324 08/08/19 0319  WBC 13.6* 18.9* 21.2*  HGB 7.9* 8.3* 9.1*  HCT 24.3* 24.7* 27.6*  PLT 364 355 400   Recent Labs  Lab 08/06/19 0729 08/07/19 0324 08/08/19 0319  NA 142 141 138  K 4.1 4.3 3.9  CL 104 104 101  CO2 30 29 29   BUN 22 23 14   CREATININE 0.71 0.77 0.71  CALCIUM 8.4* 8.3* 8.1*  PROT 5.6* 5.5* 5.7*  BILITOT 0.4 0.5 0.7  ALKPHOS 221* 179* 185*  ALT 18 22  23   AST 35 40 33  GLUCOSE 182* 230* 132*      Imaging/Diagnostic Tests: CT BIOPSY  Result Date: 08/08/2019 INDICATION: Large left upper lobe mass with chest wall and left rib invasion EXAM: CT-GUIDED BIOPSY LEFT RIB/CHEST WALL INVASIVE MASS MEDICATIONS: 1% lidocaine local ANESTHESIA/SEDATION: 1.0 mg IV Versed; 50 mcg IV Fentanyl Moderate Sedation Time:  15 minutes The patient was continuously monitored during the procedure by the interventional radiology nurse under my direct supervision. PROCEDURE: The procedure, risks, benefits, and alternatives were explained to the patient. Questions regarding the procedure were encouraged and answered. The patient understands and consents to the procedure. Previous imaging reviewed. Patient position prone. Noncontrast localization CT performed. The left upper lobe mass is locally invasive at the third rib and chest wall. This air was localized and marked. Under sterile conditions and local anesthesia, a 17 gauge 0.8 cm access needle was advanced from a posterior oblique approach into the left rib/chest wall destructive invasive component. Needle position confirmed with CT. 18 gauge core biopsies obtained. Samples were intact and non fragmented. These were placed in formalin. Patient tolerated the procedure well without complication. Vital sign monitoring by  nursing staff during the procedure will continue as patient is in the special procedures unit for post procedure observation. FINDINGS: The images document guide needle placement within the left third rib/chest wall invasive destructive mass. Post biopsy images demonstrate no hemorrhage, hematoma, effusion or pneumothorax. COMPLICATIONS: None immediate. IMPRESSION: Successful CT-guided core biopsy of the left third rib/chest wall invasive destructive mass Electronically Signed   By: Jerilynn Mages.  Shick M.D.   On: 08/08/2019 12:01    [name], DO PGY-1, [facility] 08/08/2019 12:04 PM

## 2019-08-08 NOTE — Progress Notes (Signed)
Family Medicine Teaching Service Daily Progress Note Intern Pager: [phone]  Patient name: Timothy Martinez Medical record number: [id number] Date of birth: 11-02-1950 Age: 69 y.o. Gender: male  Primary Care Provider: [name], DO Consultants: CVTS   Code Status: Full   Pt Overview and Major Events to Date:  07/30/19: Admitted, CT Lung Mass with rib infiltration  08/06/19: Bronchoscopy 08/08/19: CT guided biopsy    Assessment and Plan: Timothy Martinez is a 69 y.o. male who presented dizziness and dyspnea. PMH is significant for chronic alcoholic pancreatitis, tobacco abuse, GERD, COPD, type II diabetes, AFIB, HFpEF, lumbar back pain, hyperlipidemia    Acute hypoxic respiratory failure  Pneumonia (CAP vs. Obstructive)  Large lung mass  Patient had CT guided biopsy with IR yesterday. He is satting well on 2L [place].  Leukocytosis up trending ( WBC 27.2 today). QTc 463. Patient to complete 14 day course of abx for obstructive pneumonia on 08/14/19. Patient had home oxygen ordered.  Remains afebrile . Will ambulate with pulse ox prior to discharge. Patient medically stable for discharge and is to follow up with oncology/pulmonology outpatient.  - CVTS consulted, appreciate recommendations: s/p Bronch 1/11  - IR consulted, s/p CT guided biopsy 1/14  - Palliative following, appreciate recommendations: continue palliative outpatient  - Doxycycline (1/7 -1/9) - Levaquin 750 mg  (1/9 - to end 1/20 )  - Oxycodone 5mg  q6h   - AM CBC w/ diff  - Continuous cardiac monitoring and pulse oximetry  - PT/OT : HH PT/ OT, shower chair ordered  - Ordered home oxygen  - Ambulate with pulse ox prior to discharge   A. Fibrillation, now rate controlled HR well controlled (70-97) with oral amiodarone 400mg  BID (for one week and then 200 mg daily thereafter) and home metoprolol 75 mg BID.  Xarelto held for procedure.   - Cardiology signed off, appreciate recommendations  -Continue cardiac monitoring,  pulse oximetry -metoprolol 75 mg twice daily -Amiodorone 400 mg BID  (1/9 -  1/15 , today) , then 200 mg daily  -2.5 mg IV Metoprolol PRN  for sustained HR >120  -Resume Xarelto    Anemia Hemoglobin stable at 9.3.  - Daily CBC   AKI, resolved  - Monitor AM CMP   Hypomagnesemia Mg 1.7. Replete as necessary. Goal 2.0    Chronic pancreatitis 2/2 previous ETOH excess Chronic abdominal pain unchanged. RUQ u/s shows chronic biliary dilitation due to chronic pancreatitis. No acute findings.  Patient follow with [facility]. CT abdomen 07/30/19: Stable pancreatic ductal dilatation and multiple calcifications. Previously seen pseudocyst in head of the pancreas poorly visualized. Home meds: Ensure supplements TID between meals, Norco/Vicodin 5-325 mg every 6 hourly, Creon 2 capsules 5 times daily, Zofran 4 mg every 8 hourly, Compazine 10 mg every 8 hourly -Continue home meds -Hep B/HepC/Hep A: neg for infection  Diabetes Mellitus Fasting glucose 108  today, last A1c 6.1 10/31/2017 Home meds: Metformin 1 g BID -Hold Metformin, no need for sliding scale at present -Monitor CBGs daily with BMP  Left ear ulcer See media for image.  Concern for skin cancer given chronic nature of this ulceration and location on a frequently sun exposed area. Patient reports wound has been present for 2-3 months.  -Xerform dressing, per Wound RN consultation  -Dermatology outpatient referral, placed   Hyperlipidemia Home meds: Atorvastatin 20 mg once daily -Lipid panel  -Continue statin  HFpEF  Echocardiogram w/ limited study shows LVEF 40-45% on 08/02/19. Follows with heart care, last  seen in 2019 without concern. EF 55-60%with G1 DD on echo in 12/2014. - Cardiology signed off, appreciate recommendations -Monitor fluid status   COPD Continue to monitor respiratory status. Home meds: Dulera BID  -Continue Dulera -Albuterol PRN   GERD Home meds: Pantoprazole 40mg  once daily -Continue Protinix   -Pepcid 20 mg BID   Tobacco use Smokes 1 ppd since age 81, patient has cut down from 3 ppd -14 mg nicotine patch as needed  Chronic lower back pain, chronic, stable Currently asymptomatic. Home meds: hydrocodone, will continue - Scheduled Tylenol 650 mg q6h  - Oxycodone 5mg  BID   Previous alcohol use: Stable Last drink was 1.5 years ago.  Encouraged patient to keep this up.  Hx of colonic polyps Three sessile polpys resected during colonoscopy in 2019. No evidence of malignancy   Constipation Home meds: MiraLAX, -Continue home meds  FEN/GI: Regular diet, Ensure TID, Multivitamin PPx: Heparin gtt  Disposition: likely home with [facility], tomorrow   Subjective:  Timothy Martinez is in good spirits and wants to go home today.   Objective: Temp:  [97.6 F (36.4 C)-98.5 F (36.9 C)] 97.6 F (36.4 C) (01/15 0551) Pulse Rate:  [70-97] 79 (01/15 0551) Resp:  [13-32] 18 (01/15 0551) BP: (120-139)/(53-76) 124/66 (01/15 0551) SpO2:  [89 %-98 %] 94 % (01/15 0802) Weight:  [67.8 kg] 67.8 kg (01/15 0551)   Physical Exam:  GEN: pleasant male in no acute distress, smiles on exam  CV: regular rate and rhythm, no murmurs appreciated  RESP: no increased work of breathing, clear to ascultation bilaterally with no crackles, wheezes, or rhonchi  ABD: Bowel sounds present. Soft, Nontender, Nondistended.  MSK: no lower extremity edema, or cyanosis noted SKIN: warm, dry NEURO: grossly normal, moves all extremities appropriately PSYCH: Normal affect and thought content        Laboratory: Recent Labs  Lab 08/07/19 0324 08/08/19 0319 08/09/19 0221  WBC 18.9* 21.2* 27.2*  HGB 8.3* 9.1* 9.3*  HCT 24.7* 27.6* 28.5*  PLT 355 400 449*   Recent Labs  Lab 08/07/19 0324 08/08/19 0319 08/09/19 0221  NA 141 138 139  K 4.3 3.9 3.6  CL 104 101 101  CO2 29 29 28   BUN 23 14 19   CREATININE 0.77 0.71 0.73  CALCIUM 8.3* 8.1* 8.4*  PROT 5.5* 5.7* 5.7*  BILITOT 0.5 0.7 1.0   ALKPHOS 179* 185* 157*  ALT 22 23 18   AST 40 33 21  GLUCOSE 230* 132* 114*      Imaging/Diagnostic Tests: CT BIOPSY  Result Date: 08/08/2019 INDICATION: Large left upper lobe mass with chest wall and left rib invasion EXAM: CT-GUIDED BIOPSY LEFT RIB/CHEST WALL INVASIVE MASS MEDICATIONS: 1% lidocaine local ANESTHESIA/SEDATION: 1.0 mg IV Versed; 50 mcg IV Fentanyl Moderate Sedation Time:  15 minutes The patient was continuously monitored during the procedure by the interventional radiology nurse under my direct supervision. PROCEDURE: The procedure, risks, benefits, and alternatives were explained to the patient. Questions regarding the procedure were encouraged and answered. The patient understands and consents to the procedure. Previous imaging reviewed. Patient position prone. Noncontrast localization CT performed. The left upper lobe mass is locally invasive at the third rib and chest wall. This air was localized and marked. Under sterile conditions and local anesthesia, a 17 gauge 0.8 cm access needle was advanced from a posterior oblique approach into the left rib/chest wall destructive invasive component. Needle position confirmed with CT. 18 gauge core biopsies obtained. Samples were intact and non fragmented. These  were placed in formalin. Patient tolerated the procedure well without complication. Vital sign monitoring by nursing staff during the procedure will continue as patient is in the special procedures unit for post procedure observation. FINDINGS: The images document guide needle placement within the left third rib/chest wall invasive destructive mass. Post biopsy images demonstrate no hemorrhage, hematoma, effusion or pneumothorax. COMPLICATIONS: None immediate. IMPRESSION: Successful CT-guided core biopsy of the left third rib/chest wall invasive destructive mass Electronically Signed   By: Jerilynn Mages.  Shick M.D.   On: 08/08/2019 12:01    [name], DO PGY-1, [facility] 08/09/2019 10:01 AM

## 2019-08-08 NOTE — Progress Notes (Signed)
PT Cancellation Note  Patient Details Name: Timothy Martinez MRN: [id number] DOB: 08/11/1950   Cancelled Treatment:    Reason Eval/Treat Not Completed: Other (comment);Medical issues which prohibited therapy per chart, patient has been having issues with desaturation; upon arrival to room RN asks PT to hold for today, patient continues to desat and is now a bit agitated, taking off pulse ox and throwing it into the bed. Will continue to follow acutely.    Windell Norfolk, DPT, PN1   Supplemental Physical Therapist [facility]    Pager [phone] Acute Rehab Office [phone]

## 2019-08-08 NOTE — TOC Progression Note (Signed)
Transition of Care [facility]) - Progression Note    Patient Details  Name: Timothy Martinez MRN: [id number] Date of Birth: 1951/05/10  [facility] CM/SW Contact  Graves-Bigelow, Ocie Cornfield, RN Phone Number: 08/08/2019, 5:02 PM  Clinical Narrative:   Orders for Durable Medical Equipment sent to [employer]- Staff Registered Nurse will need to provide documentation regarding ambulatory saturations. Oxygen and shower stool to be delivered to the room prior to transition home. Family is agreeable with [facility] for services. Case Manager spoke with physician to add Registered Nurse, along with Physical and Occupational Therapy- services will start on Monday. Family is agreeable to start time. Case Manager will fax orders to agency once [place] orders updated to [phone].     Expected Discharge Plan: [place] Barriers to Discharge: No Barriers Identified  Expected Discharge Plan and Services Expected Discharge Plan: [place] In-house Referral: NA Discharge Planning Services: CM Consult Post Acute Care Choice: Home Health, Durable Medical Equipment Living arrangements for the past 2 months: Single Family Home(Patient rents the upstairs of the home- has a small [place]cehn area upstairs- has 15-17 steps to get upstairs.)                 DME Arranged: Oxygen, Shower stool DME Agency: [employer] Date DME Agency Contacted: 08/08/19   Representative spoke with at DME Agency: [place]: RN, Disease Management, PT, OT [place] Agency: [place] Date [place]: 08/08/19 Time [place]: 1619     Social Determinants of Health ([place]) Interventions    Readmission Risk Interventions No flowsheet data found.

## 2019-08-08 NOTE — Progress Notes (Signed)
Nutrition Follow-up  INTERVENTION:   -Ensure Enlive po TID, each supplement provides 350 kcal and 20 grams of protein -Magic cup BID with meals, each supplement provides 290 kcal and 9 grams of protein -Multivitamin with minerals daily  NUTRITION DIAGNOSIS:   Increased nutrient needs related to chronic illness(COPD and chronic pancreatitis) as evidenced by estimated needs.  Ongoing.  GOAL:   Patient will meet greater than or equal to 90% of their needs  Progressing.  MONITOR:   PO intake, Supplement acceptance, Labs, Weight trends, Skin, I & O's  ASSESSMENT:   69 year old with history of tobacco abuse, chronic pancreatitis, atrial fibrillation, COPD, and chronic pain on opioid therapy presenting with symptomatic hypotension and a new left upper lobe mass. Patient reports he feels tired today. Breathing is 'tight'. Endorses several months of night sweats and fatigure. Reports some shoulder and intermittent chest pains. He reports he is aware of findings on CT and is 'not ready to accept anything just yet'.  **RD working remotely**  Pt was NPO this morning for biopsy of left lung mass. Now back on heart healthy diet.  No PO has been documented since 1/8. Patient is drinking ~ 1 Ensure daily.   Admission weight: 164 lbs. Current weight: 153 lbs.  I/Os: +!.5L since admit UOP: 750 ml x 24 hrs  Medications: Creon capsule, Remeron tablet, Multivitamin with minerals daily Labs reviewed: CBGs: 126  Diet Order:   Diet Order            Diet Heart Room service appropriate? Yes with Assist; Fluid consistency: Thin  Diet effective now              EDUCATION NEEDS:   Education needs have been addressed  Skin:  Skin Assessment: Reviewed RN Assessment  Last BM:  1/09  Height:   Ht Readings from Last 1 Encounters:  08/08/19 6\' 3"  (1.905 m)    Weight:   Wt Readings from Last 1 Encounters:  08/08/19 69.8 kg    Ideal Body Weight:  89.1 kg  BMI:  Body mass index  is 19.23 kg/m.  Estimated Nutritional Needs:   Kcal:  2250-2450  Protein:  120-135 grams  Fluid:  > 2.2 L  Clayton Bibles, MS, RD, LDN Inpatient Clinical Dietitian Pager: [phone] After Hours Pager: [phone]

## 2019-08-08 NOTE — Progress Notes (Signed)
Received call from RN concerning patient desat this morning.  We discussed breathing treatment, but patient was not wheezing but his BS are Rhonchi.  Raised his [place] up for a little while then started titrating him down.  Got him down to 7L and his sats maintained in the mid 90s.  Will continue to monitor.

## 2019-08-09 ENCOUNTER — Other Ambulatory Visit: Payer: Self-pay

## 2019-08-09 DIAGNOSIS — J449 Chronic obstructive pulmonary disease, unspecified: Secondary | ICD-10-CM | POA: Diagnosis not present

## 2019-08-09 DIAGNOSIS — R918 Other nonspecific abnormal finding of lung field: Secondary | ICD-10-CM | POA: Diagnosis not present

## 2019-08-09 DIAGNOSIS — J9621 Acute and chronic respiratory failure with hypoxia: Secondary | ICD-10-CM

## 2019-08-09 LAB — COMPREHENSIVE METABOLIC PANEL
ALT: 18 U/L (ref 0–44)
AST: 21 U/L (ref 15–41)
Albumin: 1.5 g/dL — ABNORMAL LOW (ref 3.5–5.0)
Alkaline Phosphatase: 157 U/L — ABNORMAL HIGH (ref 38–126)
Anion gap: 10 (ref 5–15)
BUN: 19 mg/dL (ref 8–23)
CO2: 28 mmol/L (ref 22–32)
Calcium: 8.4 mg/dL — ABNORMAL LOW (ref 8.9–10.3)
Chloride: 101 mmol/L (ref 98–111)
Creatinine, Ser: 0.73 mg/dL (ref 0.61–1.24)
GFR calc Af Amer: >60 mL/min (ref >60)
GFR calc non Af Amer: >60 mL/min (ref >60)
Glucose, Bld: 114 mg/dL — ABNORMAL HIGH (ref 70–99)
Potassium: 3.6 mmol/L (ref 3.5–5.1)
Sodium: 139 mmol/L (ref 135–145)
Total Bilirubin: 1 mg/dL (ref 0.3–1.2)
Total Protein: 5.7 g/dL — ABNORMAL LOW (ref 6.5–8.1)

## 2019-08-09 LAB — CBC WITH DIFFERENTIAL/PLATELET
Abs Immature Granulocytes: 0.45 10*3/uL — ABNORMAL HIGH (ref 0.00–0.07)
Basophils Absolute: 0.1 10*3/uL (ref 0.0–0.1)
Basophils Relative: 0 %
Eosinophils Absolute: 0.1 10*3/uL (ref 0.0–0.5)
Eosinophils Relative: 0 %
HCT: 28.5 % — ABNORMAL LOW (ref 39.0–52.0)
Hemoglobin: 9.3 g/dL — ABNORMAL LOW (ref 13.0–17.0)
Immature Granulocytes: 2 %
Lymphocytes Relative: 7 %
Lymphs Abs: 1.9 10*3/uL (ref 0.7–4.0)
MCH: 27.7 pg (ref 26.0–34.0)
MCHC: 32.6 g/dL (ref 30.0–36.0)
MCV: 84.8 fL (ref 80.0–100.0)
Monocytes Absolute: 1.5 10*3/uL — ABNORMAL HIGH (ref 0.1–1.0)
Monocytes Relative: 6 %
Neutro Abs: 23.2 10*3/uL — ABNORMAL HIGH (ref 1.7–7.7)
Neutrophils Relative %: 85 %
Platelets: 449 10*3/uL — ABNORMAL HIGH (ref 150–400)
RBC: 3.36 MIL/uL — ABNORMAL LOW (ref 4.22–5.81)
RDW: 16.9 % — ABNORMAL HIGH (ref 11.5–15.5)
WBC: 27.2 10*3/uL — ABNORMAL HIGH (ref 4.0–10.5)
nRBC: 0.4 % — ABNORMAL HIGH (ref 0.0–0.2)

## 2019-08-09 LAB — TYPE AND SCREEN
ABO/RH(D): O POS
Antibody Screen: NEGATIVE

## 2019-08-09 LAB — GLUCOSE, CAPILLARY: Glucose-Capillary: 108 mg/dL — ABNORMAL HIGH (ref 70–99)

## 2019-08-09 MED ORDER — AMIODARONE HCL 200 MG PO TABS: ORAL_TABLET | ORAL | 0 refills | Status: AC

## 2019-08-09 MED FILL — AMIODARONE HCL 200 MG TAB: 200 | 30 days supply | Qty: 32 | Fill #0

## 2019-08-09 NOTE — Progress Notes (Signed)
Physical Therapy Treatment Patient Details Name: Timothy Martinez MRN: [id number] DOB: 04/24/1951 Today's Date: 08/09/2019    History of Present Illness 69 yo man with a history of tobacco abuse, COPD, atrial fibrillation, atrial flutter, ethanol abuse, chronic pancreatitis, hypertension, hyperlipidemia, and chronic back pain. Admitted due to dizziness with hypotension, weakness and anemia  CT of the chest confirmed a large left upper lobe mass with chest wall invasion along with mediastinal and hilar adenopathy.    PT Comments    Pt was in chair with fatigue and was carefully assisted to stand and take steps to the bed, with positioning on the bed afterward.  He is expecting to go home today, but is still on O2 and requires some supervision to move safely.  Will continue if dc is held for any reason today, with focus on balance and endurance.  Follow Up Recommendations  Supervision - Intermittent;Home health PT     Equipment Recommendations  None recommended by PT    Recommendations for Other Services       Precautions / Restrictions Precautions Precautions: Fall Precaution Comments: previous falls at home Restrictions Weight Bearing Restrictions: No Other Position/Activity Restrictions: check BP    Mobility  Bed Mobility Overal bed mobility: Needs Assistance Bed Mobility: Sit to Supine       Sit to supine: Min guard   General bed mobility comments: rail and cues with help for lines  Transfers Overall transfer level: Needs assistance Equipment used: None Transfers: Sit to/from Stand Sit to Stand: Min guard         General transfer comment: min guard to maintain safety and manage lines  Ambulation/Gait                 Stairs             Wheelchair Mobility    Modified Rankin (Stroke Patients Only)       Balance     Sitting balance-Leahy Scale: Good                                      Cognition Arousal/Alertness:  Awake/alert Behavior During Therapy: WFL for tasks assessed/performed Overall Cognitive Status: Within Functional Limits for tasks assessed                                        Exercises General Exercises - Lower Extremity Ankle Circles/Pumps: AROM;5 reps    General Comments General comments (skin integrity, edema, etc.): pt is tired from being OOB and was noted to desaturate to walk previously but maintained O2 sat with the transition      Pertinent Vitals/Pain Pain Assessment: No/denies pain    Home Living                      Prior Function            PT Goals (current goals can now be found in the care plan section) Progress towards PT goals: Progressing toward goals    Frequency    Min 3X/week      PT Plan Current plan remains appropriate    Co-evaluation              AM-PAC PT "6 Clicks" Mobility   Outcome Measure  Help needed turning from your back to  your side while in a flat bed without using bedrails?: None Help needed moving from lying on your back to sitting on the side of a flat bed without using bedrails?: A Little Help needed moving to and from a bed to a chair (including a wheelchair)?: A Little Help needed standing up from a chair using your arms (e.g., wheelchair or bedside chair)?: A Little Help needed to walk in hospital room?: A Little Help needed climbing 3-5 steps with a railing? : Total 6 Click Score: 17    End of Session   Activity Tolerance: Patient tolerated treatment well Patient left: in bed;with call bell/phone within reach;with bed alarm set Nurse Communication: Mobility status PT Visit Diagnosis: Difficulty in walking, not elsewhere classified (R26.2);Pain     Time: 1220-1248 PT Time Calculation (min) (ACUTE ONLY): 28 min  Charges:  $Therapeutic Activity: 23-37 mins               Ramond Dial 08/09/2019, 8:27 PM  Mee Hives, PT MS Acute Rehab Dept. Number: [place]

## 2019-08-09 NOTE — Discharge Instructions (Signed)
You were hospitalized at [facility] due to shortness of breath.  We expect this is from a lung mass concerning for cancer. We are so glad you are feeling better. You have a follow up appointment scheduled with Dr. Garlan Fillers on Monday at 1:30pm.  You will be contacted to schedule follow up appointments with oncology and pulmonology.  Thank you for allowing [place] to take care of you.    Take care, [facility] family medicine team

## 2019-08-09 NOTE — Progress Notes (Signed)
SATURATION QUALIFICATIONS: (This note is used to comply with regulatory documentation for home oxygen)  Patient Saturations on Room Air at Rest = 87%  Patient Saturations on Room Air while Ambulating = Not tested as patient desaturated on room air at rest.  Patient Saturations on 4 Liters of oxygen at Rest = 94%  Please briefly explain why patient needs home oxygen: Patient unable to maintain oxygen saturations >92% without supplemental oxygen.

## 2019-08-09 NOTE — TOC Transition Note (Signed)
Transition of Care [facility]) - CM/SW Discharge Note   Patient Details  Name: Timothy Martinez MRN: [id number] Date of Birth: [birth year]  [facility]) CM/SW Contact:  Bethena Roys, RN Phone Number: 08/09/2019, 12:08 PM   Clinical Narrative:  Home Health orders faxed to [facility]. Start of care to begin on Monday. Patient will have oxygen concentrator in the home delivered from [employer]. Portable oxygen along with shower chair to be delivered to the room prior to transition home. No further needs from Case Manager at this time.   Final next level of care: [place] Barriers to Discharge: No Barriers Identified   Patient Goals and CMS Choice Patient states their goals for this hospitalization and ongoing recovery are:: "to return home" CMS Medicare.gov Compare Post Acute Care list provided to:: Other (Comment Required)(Daughter [name]) Choice offered to / list presented to : Adult Children   Discharge Plan and Services In-house Referral: NA Discharge Planning Services: CM Consult Post Acute Care Choice: Home Health, Durable Medical Equipment          DME Arranged: Oxygen, Shower stool DME Agency: [employer] Date DME Agency Contacted: 08/08/19   Representative spoke with at DME Agency: [place]: RN, Disease Management, PT, OT [place] Agency: [place] Date [place]: 08/08/19 Time [place]: 1619    Social Determinants of Health ([place]) Interventions     Readmission Risk Interventions No flowsheet data found.

## 2019-08-09 NOTE — Progress Notes (Signed)
OT Cancellation Note  Patient Details Name: Timothy Martinez MRN: [id number] DOB: 18-Oct-1950   Cancelled Treatment:     Attempt to see patient in afternoon (per pt request), patient declining to get out of bed, states pins in his arm are hurting. Patient reports he already received energy conservation packet, OT ask if patient has any questions which patient declines. Encourage patient that getting out of bed throughout the day is still important, patient verbalize understanding but does not want to get to chair to eat lunch. Patient reports he is going home tomorrow once the gas is turned on at his house. Will re-attempt 1/16 as schedule allows.   [place] OT office: [place] 08/09/2019, 1:55 PM

## 2019-08-12 ENCOUNTER — Telehealth: Payer: Self-pay | Admitting: *Deleted

## 2019-08-12 ENCOUNTER — Telehealth: Payer: Self-pay

## 2019-08-12 NOTE — Telephone Encounter (Signed)
Oncology Nurse Navigator Documentation  Oncology Nurse Navigator Flowsheets 08/12/2019  Navigator Location [facility]  Referral Date to RadOnc/MedOnc -  Navigator Encounter Type Telephone  Telephone Outgoing Call/I followed up on Timothy Martinez and if he was still in-patient.  He has been discharged.  I called him to schedule him to see Dr. Julien Nordmann.  I was unable to reach him but did leave vm message with my name and phone number to call.   Patient Visit Type -  Barriers/Navigation Needs Education  Education Other  Interventions Education  Acuity Level 2-Minimal Needs (1-2 Barriers Identified)  Coordination of Care -  Education Method Verbal  Time Spent with Patient 15

## 2019-08-13 ENCOUNTER — Telehealth: Payer: Self-pay | Admitting: Family Medicine

## 2019-08-13 ENCOUNTER — Telehealth: Payer: Self-pay

## 2019-08-13 DIAGNOSIS — F17210 Nicotine dependence, cigarettes, uncomplicated: Secondary | ICD-10-CM | POA: Diagnosis not present

## 2019-08-13 LAB — SURGICAL PATHOLOGY

## 2019-08-13 NOTE — Telephone Encounter (Signed)
Manus Gunning, with palliative care, calls nurse line requesting verbal orders for patient. Patient has recently been enrolled in their palliative care program. With this program the patients are given a nurse and social worker for needs. Please call Manus Gunning at [phone] for the verbal ok.

## 2019-08-13 NOTE — Telephone Encounter (Signed)
Took care of Timothy Martinez during his recent hospital stay for LUL lung mass, underwent a CT biopsy for tissue diagnosis.  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  Called Timothy Martinez to discuss his results, no answer.  Called his daughter, Timothy Martinez, to see if she was with him.  States she just spoke with him 30 minutes ago and that he had was laying down to rest for the duration of the night.  Informed her that I have resulted labs to discuss with him and inquired the best time to call back.  She requests that we call back around 12:30 PM tomorrow as this is when his other daughter Timothy Martinez will be at the house with him for support when we discuss these results (he had requested this earlier knowing that he would likely get a call for this).  Went over his scheduled follow-ups including pulmonary tomorrow at 3 pm and oncology on 1/21.  She additionally inquired about palliative care as they have not heard from them, stated they were obtaining final orders and they would reach out soon.  Will call back tomorrow.   Patriciaann Clan, DO

## 2019-08-13 NOTE — Telephone Encounter (Signed)
Called and spoke to [employer] and made an appt for pt for 1.20.21 with Eustaquio Maize, NP, for HFU. LM for pt to go over covid screen prior to appt.

## 2019-08-14 ENCOUNTER — Encounter: Payer: Self-pay | Admitting: *Deleted

## 2019-08-14 ENCOUNTER — Ambulatory Visit (INDEPENDENT_AMBULATORY_CARE_PROVIDER_SITE_OTHER): Payer: Medicare Other | Admitting: Primary Care

## 2019-08-14 ENCOUNTER — Telehealth: Payer: Self-pay | Admitting: Family Medicine

## 2019-08-14 ENCOUNTER — Other Ambulatory Visit: Payer: Self-pay

## 2019-08-14 ENCOUNTER — Other Ambulatory Visit: Payer: Self-pay | Admitting: Medical Oncology

## 2019-08-14 ENCOUNTER — Encounter: Payer: Self-pay | Admitting: Primary Care

## 2019-08-14 DIAGNOSIS — J449 Chronic obstructive pulmonary disease, unspecified: Secondary | ICD-10-CM | POA: Insufficient documentation

## 2019-08-14 DIAGNOSIS — I959 Hypotension, unspecified: Secondary | ICD-10-CM

## 2019-08-14 DIAGNOSIS — J9621 Acute and chronic respiratory failure with hypoxia: Secondary | ICD-10-CM

## 2019-08-14 DIAGNOSIS — I4891 Unspecified atrial fibrillation: Secondary | ICD-10-CM | POA: Diagnosis not present

## 2019-08-14 DIAGNOSIS — R911 Solitary pulmonary nodule: Secondary | ICD-10-CM

## 2019-08-14 DIAGNOSIS — R918 Other nonspecific abnormal finding of lung field: Secondary | ICD-10-CM | POA: Diagnosis not present

## 2019-08-14 MED ORDER — GUAIFENESIN ER 600 MG PO TB12: 600.0000 mg | ORAL_TABLET | Freq: Two times a day (BID) | ORAL | 1 refills | Status: AC

## 2019-08-14 MED ORDER — SPIRIVA RESPIMAT 2.5 MCG/ACT IN AERS: 2.0000 | INHALATION_SPRAY | Freq: Two times a day (BID) | RESPIRATORY_TRACT | 0 refills | Status: AC

## 2019-08-14 MED ORDER — ALBUTEROL SULFATE HFA 108 (90 BASE) MCG/ACT IN AERS: 2.0000 | INHALATION_SPRAY | Freq: Four times a day (QID) | RESPIRATORY_TRACT | 3 refills | Status: AC | PRN

## 2019-08-14 NOTE — Progress Notes (Signed)
Oncology Nurse Navigator Documentation  Oncology Nurse Navigator Flowsheets 08/14/2019  Navigator Location [facility]  Referral Date to RadOnc/MedOnc -  Navigator Encounter Type Other:  Telephone -  Patient Visit Type -  Barriers/Navigation Needs Coordination of Care/per Dr. Worthy Flank request, I updated pathology to send PDL 1 on Mr. Terpstra recent pathology.    Education -  Interventions Coordination of Care  Acuity Level 2-Minimal Needs (1-2 Barriers Identified)  Coordination of Care Other  Education Method -  Time Spent with Patient 30

## 2019-08-14 NOTE — Telephone Encounter (Signed)
Reach Mr. Timothy Martinez and his daughter Timothy Martinez to discuss results. His pathology results were released to MyChart yesterday, however they have not reviewed them yet.  Informed them the biopsy returned positive for squamous cell carcinoma, a diagnosis they had already been expecting since they were told of his substantial LUL mass.   Will allow continued discussion on prognosis and further treatment for when he meets with oncology and pulmonology today and tomorrow.   Mr. Moening and his daughter appreciated the call.  Let them know to call our clinic if they have any questions or concerns.   Patriciaann Clan, DO

## 2019-08-14 NOTE — Assessment & Plan Note (Addendum)
-   Current smoker, new dx lung cancer  - PFTs in November 2020- FEV1 2.76 (68%), Ratio 62 - Continue Dulera 200 mcg two puffs twice daily - Adding Spiriva Respimat- 2 puffs once daily  - PRN albuterol hfa every 4-6 hours as needed for breakthrough shortness of breath or wheezing - Recommend Mucinex 600 mg twice daily for 1 to 2 weeks for chest congestion

## 2019-08-14 NOTE — Assessment & Plan Note (Signed)
-   BP 88/52 in office, asymptomatic - Denies dizziness or lightheadedness  - Plan hold pm metoprolol, check BP in am and if >100/60 resume. If remains low patient is to contact cardiology/PCP for further instruction. Encourage gentle oral hydration.

## 2019-08-14 NOTE — Assessment & Plan Note (Signed)
-   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 Mohamed/oncology tomorrow January 21st  - Palliative care following

## 2019-08-14 NOTE — Progress Notes (Signed)
'@Patient'  ID: Timothy Martinez, male    DOB: 31-Jul-1950, 69 y.o.   MRN: [id number]  Chief Complaint  Patient presents with  . Follow-up    SOB, cogh, pain in chest     Referring provider: [name], DO  HPI: 69 year old male, current every day smoker.  Past medical history significant for COPD, hypertension, heart failure, A. fib, diabetes, pancreatitis, alcohol/tobacco abuse.  New patient to pulmonary, he has not been seen by one of our providers.    Recently admitted to the hospital from 1/5-1/15/21 new left upper lobe lung mass.  Originally presented to emergency room with dizziness, shortness of breath and hypotension. Chest x-ray 07/30/19 revealed large opacity left lower lobe suspicious for lung mass.  CTA showed large left upper lobe mass lesion with apparent chest wall invasion consistent with primary pulmonary neoplasm.  Associated mediastinal and hilar adenopathy.  CVTS, cardiology and IR consulted during hospital stay.  He underwent a bronchoscopy with EBUS by Dr. Roxan Hockey on 08/05/2019.  Bronch cytologies and pathology did not give definitive diagnosis.  Patient underwent CT-guided needle biopsy 08/08/2019 which showed squamous cell carcinoma with sarcomatoid features.  Patient completed 14-day course of Levaquin on 08/14/2019 for possible obstructive pneumonia.  Originally Lopressor was held due to hypotension.  He developed atrial fibrillation with RVR.  Rhythm converted with IV amiodarone.  Patient had loading dose 400 mg amiodarone for 7 days and discharged on 200 mg daily.   08/14/2019  Presents today for hospital follow-up, he is accompanied by his daughter.  Newly diagnosed with left upper lobe lung mass.  Pathology positive for squamous cell carcinoma.  He has an appointment with Dr. Earlie Server tomorrow.  States that his breathing has been okay since discharge on 1/15.  He has a non-productive/ congested cough.  Reports that wheezing has resolved.  He had pulmonary function test  which showed moderate obstructive airway disease with severe diffusion defect back in November as part of preop evaluation.  He is newly on oxygen and remains on 4 L. Currently on Dulera 2 puffs twice daily, patient states he has been on this for 4 years.  He has used ProAir in the past but does not currently have a rescue inhaler on hand.  He continues to smoke half a pack a day but is actively trying to quit. He takes hycodan for back pain 3 times a day.  He is not currently in any pain during today's visit.  He has been in contact with palliative care over the phone.  He lives alone but daughter is involved in his care. Denies dizziness/light headedness, shortness of breath or wheezing.   Allergies  Allergen Reactions  . Prozac [Fluoxetine Hcl] Nausea Only    Made the patient very sick to his stomach  . Morphine And Related Swelling    Pt states he can tolerate oxycodone and hydromorphone  . Penicillins Rash    Tolerated cefazolin 2021 Has patient had a PCN reaction causing immediate rash, facial/tongue/throat swelling, SOB or lightheadedness with hypotension: Yes Has patient had a PCN reaction causing severe rash involving mucus membranes or skin necrosis: No Has patient had a PCN reaction that required hospitalization: No Has patient had a PCN reaction occurring within the last 10 years: No If all of the above answers are "NO", then may proceed with Cephalosporin use.     Immunization History  Administered Date(s) Administered  . Influenza Split 06/24/2011  . Influenza,inj,Quad PF,6+ Mos 04/11/2013, 05/12/2017, 03/29/2019  . Pneumococcal Polysaccharide-23  12/21/2012    Past Medical History:  Diagnosis Date  . Acute on chronic pancreatitis ([place]) 05/17/2016  . Alcohol abuse   . Alcoholic pancreatitis   . Anginal pain ([place])   . Anxiety and depression 01/20/2015   PHQ 9 = 11 (01/20/15)    . Arthritis    "eat up w/it" (05/17/2016)  . Atrial fibrillation status post cardioversion  ([place]) 08/2014  . Atrial flutter, unspecified   . CHF (congestive heart failure) ([place])   . Chronic anticoagulation   . Chronic atrial fibrillation ([place])   . Chronic bronchitis ([place])   . Chronic left-sided low back pain with sciatica 12/21/2012  . Chronic pancreatitis ([place])   . COLD (chronic obstructive lung disease) ([place])   . Complication of anesthesia    pt C/O confusion after procedure in April 2020  . Daily headache    "need eye examined" (05/17/2016)  . Gastric outlet obstruction 10/2018  . GERD (gastroesophageal reflux disease)   . Hematuria, microscopic 02/13/2015   Noted on UA - Recheck UA at next visit ~ 1 months   . Hyperlipidemia   . Hypertension   . Insomnia 02/11/2016  . Lateral epicondylitis of right elbow 03/17/2017  . Left foot pain 10/12/2013  . Loss of weight 02/19/2018  . Low back pain 12/21/2012  . Malnutrition of moderate degree 05/18/2016  . Neck pain, bilateral posterior 10/15/2014  . Neuropathy 03/16/2015  . Pain of joint of left ankle and foot 06/13/2018  . Pain of right upper extremity 05/30/2016  . Pancreatic pseudocyst    seen on CT scan 07/2014  . Pancreatitis 08/04/2014  . Permanent atrial fibrillation ([place]) 05/05/2017  . Pneumonia    "?a few times" (05/17/2016)  . 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 Tobacco abuse 05/30/2011   Heavy smoker up to 3 ppd down to 15 cigs per day in 09/2014   . Type II diabetes mellitus ([place])   . Wears dentures   . Wears glasses     Tobacco History: Social History   Tobacco Use  Smoking Status Current Every Day Smoker  . Packs/day: 0.50  . Years: 52.00  . Pack years: 26.00  . Types: Cigarettes  . Start date: 04/09/1964  Smokeless Tobacco Never Used  Tobacco Comment   down to one pack daily   Ready to quit: Yes Counseling given: Yes Comment: down to one pack daily   Outpatient Medications Prior to Visit  Medication Sig Dispense Refill  . amiodarone (PACERONE) 200 MG tablet Take 2 tablets (400 mg total) by mouth 2 (two)  times daily for 1 day, THEN 1 tablet (200 mg total) daily for 28 days. 32 tablet 0  . amitriptyline (ELAVIL) 10 MG tablet TAKE 1 TABLET BY MOUTH EVERYDAY AT BEDTIME (Patient taking differently: Take 10 mg by mouth at bedtime. ) 90 tablet 1  . atorvastatin (LIPITOR) 40 MG tablet Take 0.5 tablets (20 mg total) by mouth daily at 6 PM. (Patient taking differently: Take 40 mg by mouth daily at 6 PM. ) 90 tablet 3  . Blood Glucose Monitoring Suppl (ONE TOUCH ULTRA 2) w/Device KIT Dispense one glucometer. Diagnosis Type 2 Diabetes. E11.9 1 each 0  . famotidine (PEPCID) 20 MG tablet TAKE 1 TABLET BY MOUTH TWICE A DAY (Patient taking differently: Take 20 mg by mouth every morning. ) 30 tablet 3  . feeding supplement, ENSURE ENLIVE, (ENSURE ENLIVE) LIQD Take 237 mLs by mouth 3 (three) times daily between meals. 237 mL 12  . gabapentin (NEURONTIN)  100 MG capsule TAKE 1 CAPSULE BY MOUTH THREE TIMES A DAY (Patient taking differently: Take 100 mg by mouth 2 (two) times daily. ) 270 capsule 2  . glucose blood (ONE TOUCH ULTRA TEST) test strip Use check fasting blood glucose qAM. Type 2 Diabetes E11.9 100 each 3  . Lancets (FREESTYLE) lancets Use as instructed 100 each 12  . Lancets (ONETOUCH ULTRASOFT) lancets Use as instructed 100 each 12  . levofloxacin (LEVAQUIN) 750 MG tablet Take 1 tablet (750 mg total) by mouth daily for 5 days. 5 tablet 0  . lipase/protease/amylase (CREON) 36000 UNITS CPEP capsule Take 36,000-72,000 capsules by mouth See admin instructions. 2 caps with each meal, 1 capsule with snacks    . metFORMIN (GLUCOPHAGE) 1000 MG tablet Take 1 tablet (1,000 mg total) by mouth 2 (two) times daily with a meal. 180 tablet 3  . metoprolol tartrate (LOPRESSOR) 50 MG tablet Take 1.5 tablets (75 mg total) by mouth 2 (two) times daily. 270 tablet 3  . mometasone-formoterol (DULERA) 200-5 MCG/ACT AERO Inhale 2 puffs into the lungs 2 (two) times daily. 3 Inhaler 1  . Multiple Vitamin (MULTIVITAMIN WITH MINERALS)  TABS tablet Take 1 tablet by mouth daily.    . naproxen sodium (ALEVE) 220 MG tablet Take 880 mg by mouth 2 (two) times daily as needed (pain).    . nitroGLYCERIN (NITROSTAT) 0.4 MG SL tablet Place 1 tablet (0.4 mg total) under the tongue every 5 (five) minutes as needed for chest pain. 100 tablet 3  . ondansetron (ZOFRAN ODT) 4 MG disintegrating tablet Take 1 tablet (4 mg total) by mouth every 8 (eight) hours as needed for nausea or vomiting. 10 tablet 0  . oxyCODONE (OXY IR/ROXICODONE) 5 MG immediate release tablet Take 1 tablet (5 mg total) by mouth every 6 (six) hours as needed for severe pain. 30 tablet 0  . pantoprazole (PROTONIX) 40 MG tablet Take 1 tablet (40 mg total) by mouth daily. 90 tablet 3  . polyethylene glycol (MIRALAX / GLYCOLAX) 17 g packet TAKE 17 G BY MOUTH DAILY. (Patient taking differently: Take 17 g by mouth daily as needed for moderate constipation. ) 14 packet 0  . prochlorperazine (COMPAZINE) 10 MG tablet Take 1 tablet (10 mg total) by mouth every 8 (eight) hours as needed for nausea or vomiting. 30 tablet 2  . tamsulosin (FLOMAX) 0.4 MG CAPS capsule Take 1 capsule (0.4 mg total) by mouth daily. 30 capsule 3  . XARELTO 20 MG TABS tablet TAKE 1 TABLET BY MOUTH DAILY WITH SUPPER (Patient taking differently: Take 20 mg by mouth daily with supper. ) 90 tablet 1   No facility-administered medications prior to visit.   Review of Systems  Review of Systems  Constitutional: Positive for fatigue and unexpected weight change. Negative for chills and diaphoresis.  Respiratory: Positive for cough. Negative for shortness of breath and wheezing.        Dry cough  Cardiovascular: Negative.   Neurological: Negative for dizziness and light-headedness.   Physical Exam  BP (!) 88/52 (BP Location: Left Arm, Patient Position: Sitting, Cuff Size: Normal)   Pulse 82   Temp 98.2 F (36.8 C) (Temporal)   Ht '6\' 3"'  (1.905 m)   Wt 148 lb 6.4 oz (67.3 kg)   SpO2 100%   BMI 18.55 kg/m    Physical Exam Constitutional:      General: He is not in acute distress.    Appearance: He is ill-appearing.     Comments: Appears  underweight  HENT:     Head: Normocephalic and atraumatic.     Mouth/Throat:     Comments: Deferred d/t masking Cardiovascular:     Rate and Rhythm: Normal rate.     Comments: No edema Pulmonary:     Effort: Pulmonary effort is normal.     Breath sounds: No wheezing or rhonchi.     Comments: Diminished left base. No obvious rhonchi, wheezing or rales. O2 100% 4L.  Abdominal:     Palpations: Abdomen is soft.     Tenderness: There is no abdominal tenderness.  Musculoskeletal:     Comments: Gait not assessed, in WC  Skin:    General: Skin is dry.     Findings: Bruising present.  Neurological:     General: No focal deficit present.     Mental Status: He is alert and oriented to person, place, and time. Mental status is at baseline.  Psychiatric:        Mood and Affect: Mood normal.        Behavior: Behavior normal.        Thought Content: Thought content normal.        Judgment: Judgment normal.      Lab Results:  CBC    Component Value Date/Time   WBC 27.2 (H) 08/09/2019 0221   RBC 3.36 (L) 08/09/2019 0221   HGB 9.3 (L) 08/09/2019 0221   HGB 13.6 03/29/2019 1027   HCT 28.5 (L) 08/09/2019 0221   HCT 41.1 03/29/2019 1027   PLT 449 (H) 08/09/2019 0221   PLT 420 03/29/2019 1027   MCV 84.8 08/09/2019 0221   MCV 88 03/29/2019 1027   MCH 27.7 08/09/2019 0221   MCHC 32.6 08/09/2019 0221   RDW 16.9 (H) 08/09/2019 0221   RDW 13.2 03/29/2019 1027   LYMPHSABS 1.9 08/09/2019 0221   LYMPHSABS 2.6 03/29/2019 1027   MONOABS 1.5 (H) 08/09/2019 0221   EOSABS 0.1 08/09/2019 0221   EOSABS 0.2 03/29/2019 1027   BASOSABS 0.1 08/09/2019 0221   BASOSABS 0.1 03/29/2019 1027    BMET    Component Value Date/Time   NA 139 08/09/2019 0221   NA 140 03/29/2019 1027   K 3.6 08/09/2019 0221   CL 101 08/09/2019 0221   CO2 28 08/09/2019 0221   GLUCOSE  114 (H) 08/09/2019 0221   BUN 19 08/09/2019 0221   BUN 10 03/29/2019 1027   CREATININE 0.73 08/09/2019 0221   CREATININE 0.81 02/11/2016 1517   CALCIUM 8.4 (L) 08/09/2019 0221   GFRNONAA >60 08/09/2019 0221   GFRNONAA >89 02/11/2016 1517   GFRAA >60 08/09/2019 0221   GFRAA >89 02/11/2016 1517    BNP    Component Value Date/Time   BNP 386.3 (H) 07/30/2019 1714    ProBNP    Component Value Date/Time   PROBNP 96.0 02/18/2007 2000    Imaging: DG Chest 2 View  Result Date: 08/03/2019 CLINICAL DATA:  Significant atrial fibrillation. Alcoholic pancreatitis. EXAM: CHEST - 2 VIEW COMPARISON:  August 01, 2019 FINDINGS: The mass in the left upper lobe with destruction of the left posterior third rib is similar in the interval. Bilateral pulmonary opacities are diffuse and similar in the interval. No other interval changes. IMPRESSION: 1. The left upper lobe mass with destruction of the posterior left third rib is stable. 2. Diffuse bilateral pulmonary infiltrates are similar in the interval. Electronically Signed   By: Dorise Bullion III M.D   On: 08/03/2019 10:16   CT Angio Chest  PE W and/or Wo Contrast  Result Date: 07/30/2019 CLINICAL DATA:  Shortness of breath and near syncopal episode, mass lesion on recent chest x-ray EXAM: CT ANGIOGRAPHY CHEST CT ABDOMEN AND PELVIS WITH CONTRAST TECHNIQUE: Multidetector CT imaging of the chest was performed using the standard protocol during bolus administration of intravenous contrast. Multiplanar CT image reconstructions and MIPs were obtained to evaluate the vascular anatomy. Multidetector CT imaging of the abdomen and pelvis was performed using the standard protocol during bolus administration of intravenous contrast. CONTRAST:  161m OMNIPAQUE IOHEXOL 350 MG/ML SOLN COMPARISON:  Prior chest x-ray from earlier in the same day CT from 03/30/2018. FINDINGS: CTA CHEST FINDINGS Cardiovascular: Thoracic aorta demonstrates atherosclerotic calcifications  without aneurysmal dilatation. No cardiac enlargement is noted. Mild coronary calcifications are noted. The pulmonary artery is well visualized within normal enhancement pattern. No filling defect to suggest pulmonary embolism is seen. Mediastinum/Nodes: Thoracic inlet shows a hypodensity within the left lobe of the thyroid. This measures approximately 14 mm in greatest dimension. The esophagus as visualized is within normal limits. Scattered small mediastinal lymph nodes are noted primarily within the AP window. Small hilar adenopathy is noted bilaterally. Lungs/Pleura: In the left upper lobe there is a focal soft tissue mass lesion which measures approximately 11.8 x 8.2 cm in greatest AP and transverse dimensions respectively. It has some extension to the left hilum and there are associated changes involving the posterolateral aspect of the left third rib consistent with localized invasion and metastatic disease. There also appears to be soft tissue density between the third and fourth ribs laterally consistent with chest wall invasion. Emphysematous changes are noted with evidence of subpleural fibrosis similar to that seen on prior chest x-rays. 7 mm nodule is noted in the right upper lobe best seen on image number 76 of series 4. This is stable from a prior CT examination. Musculoskeletal: As described above there are destructive changes of the posterolateral aspect of the left third rib consistent with localized neoplastic invasion. No other specific rib abnormality is noted. The thoracic spine demonstrates degenerative change without lytic lesion or compression deformity. Review of the MIP images confirms the above findings. CT ABDOMEN and PELVIS FINDINGS Hepatobiliary: Gallbladder is well distended. Mild biliary ductal dilatation is seen secondary to changes of chronic pancreatitis in the head of the pancreas. These changes are similar to that seen on prior abdominal CT. The liver demonstrates some fatty  infiltration. No definitive mass is seen. Pancreas: Chronic changes are noted in the pancreas with pancreatic ductal dilatation stable from the prior exam. Multiple calcifications are seen in the head and uncinate process consistent with prior chronic pancreatitis. Previously seen pseudocyst in the head of the pancreas is not as well visualized on today's exam. Spleen: Surgically removed Adrenals/Urinary Tract: Adrenal glands are within normal limits. The kidneys demonstrate a normal enhancement pattern bilaterally. No definitive calculi are seen. Delayed images demonstrate dense nephrograms without significant contrast enhancement. This is of uncertain significance but may be related to incomplete delayed images. No elevated creatinine is seen. The bladder is partially distended. Stomach/Bowel: No obstructive or inflammatory changes of the colon are seen. The appendix is not well visualized. No obstructive changes of the small bowel are noted. Stomach is within normal limits. Vascular/Lymphatic: Aortic atherosclerosis. No enlarged abdominal or pelvic lymph nodes. Reproductive: Prostate is unremarkable. Other: No abdominal wall hernia or abnormality. No abdominopelvic ascites. Musculoskeletal: Degenerative changes of lumbar spine are noted. No metastatic lesions are seen. Review of the MIP images confirms  the above findings. IMPRESSION: Large left upper lobe mass lesion with apparent chest wall invasion in rib destruction as described consistent with a primary pulmonary neoplasm till proven otherwise. Associated mediastinal and hilar adenopathy is seen. Stable nodule in the right upper lobe unchanged from 2019. No evidence of pulmonary emboli. Hypodense lesion within the left lobe of the thyroid. No followup recommended (ref: J Am Coll Radiol. 2015 Feb;12(2): 143-50). Chronic changes in the pancreas consistent with chronic pancreatitis. Pancreatic ductal dilatation in biliary ductal dilatation is seen. Chronic  changes in the abdomen and pelvis stable from previous exams. Electronically Signed   By: Inez Catalina M.D.   On: 07/30/2019 20:17   CT ABDOMEN PELVIS W CONTRAST  Result Date: 07/30/2019 CLINICAL DATA:  Shortness of breath and near syncopal episode, mass lesion on recent chest x-ray EXAM: CT ANGIOGRAPHY CHEST CT ABDOMEN AND PELVIS WITH CONTRAST TECHNIQUE: Multidetector CT imaging of the chest was performed using the standard protocol during bolus administration of intravenous contrast. Multiplanar CT image reconstructions and MIPs were obtained to evaluate the vascular anatomy. Multidetector CT imaging of the abdomen and pelvis was performed using the standard protocol during bolus administration of intravenous contrast. CONTRAST:  146m OMNIPAQUE IOHEXOL 350 MG/ML SOLN COMPARISON:  Prior chest x-ray from earlier in the same day CT from 03/30/2018. FINDINGS: CTA CHEST FINDINGS Cardiovascular: Thoracic aorta demonstrates atherosclerotic calcifications without aneurysmal dilatation. No cardiac enlargement is noted. Mild coronary calcifications are noted. The pulmonary artery is well visualized within normal enhancement pattern. No filling defect to suggest pulmonary embolism is seen. Mediastinum/Nodes: Thoracic inlet shows a hypodensity within the left lobe of the thyroid. This measures approximately 14 mm in greatest dimension. The esophagus as visualized is within normal limits. Scattered small mediastinal lymph nodes are noted primarily within the AP window. Small hilar adenopathy is noted bilaterally. Lungs/Pleura: In the left upper lobe there is a focal soft tissue mass lesion which measures approximately 11.8 x 8.2 cm in greatest AP and transverse dimensions respectively. It has some extension to the left hilum and there are associated changes involving the posterolateral aspect of the left third rib consistent with localized invasion and metastatic disease. There also appears to be soft tissue density  between the third and fourth ribs laterally consistent with chest wall invasion. Emphysematous changes are noted with evidence of subpleural fibrosis similar to that seen on prior chest x-rays. 7 mm nodule is noted in the right upper lobe best seen on image number 76 of series 4. This is stable from a prior CT examination. Musculoskeletal: As described above there are destructive changes of the posterolateral aspect of the left third rib consistent with localized neoplastic invasion. No other specific rib abnormality is noted. The thoracic spine demonstrates degenerative change without lytic lesion or compression deformity. Review of the MIP images confirms the above findings. CT ABDOMEN and PELVIS FINDINGS Hepatobiliary: Gallbladder is well distended. Mild biliary ductal dilatation is seen secondary to changes of chronic pancreatitis in the head of the pancreas. These changes are similar to that seen on prior abdominal CT. The liver demonstrates some fatty infiltration. No definitive mass is seen. Pancreas: Chronic changes are noted in the pancreas with pancreatic ductal dilatation stable from the prior exam. Multiple calcifications are seen in the head and uncinate process consistent with prior chronic pancreatitis. Previously seen pseudocyst in the head of the pancreas is not as well visualized on today's exam. Spleen: Surgically removed Adrenals/Urinary Tract: Adrenal glands are within normal limits. The kidneys demonstrate  a normal enhancement pattern bilaterally. No definitive calculi are seen. Delayed images demonstrate dense nephrograms without significant contrast enhancement. This is of uncertain significance but may be related to incomplete delayed images. No elevated creatinine is seen. The bladder is partially distended. Stomach/Bowel: No obstructive or inflammatory changes of the colon are seen. The appendix is not well visualized. No obstructive changes of the small bowel are noted. Stomach is within  normal limits. Vascular/Lymphatic: Aortic atherosclerosis. No enlarged abdominal or pelvic lymph nodes. Reproductive: Prostate is unremarkable. Other: No abdominal wall hernia or abnormality. No abdominopelvic ascites. Musculoskeletal: Degenerative changes of lumbar spine are noted. No metastatic lesions are seen. Review of the MIP images confirms the above findings. IMPRESSION: Large left upper lobe mass lesion with apparent chest wall invasion in rib destruction as described consistent with a primary pulmonary neoplasm till proven otherwise. Associated mediastinal and hilar adenopathy is seen. Stable nodule in the right upper lobe unchanged from 2019. No evidence of pulmonary emboli. Hypodense lesion within the left lobe of the thyroid. No followup recommended (ref: J Am Coll Radiol. 2015 Feb;12(2): 143-50). Chronic changes in the pancreas consistent with chronic pancreatitis. Pancreatic ductal dilatation in biliary ductal dilatation is seen. Chronic changes in the abdomen and pelvis stable from previous exams. Electronically Signed   By: Inez Catalina M.D.   On: 07/30/2019 20:17   CT BIOPSY  Result Date: 08/08/2019 INDICATION: Large left upper lobe mass with chest wall and left rib invasion EXAM: CT-GUIDED BIOPSY LEFT RIB/CHEST WALL INVASIVE MASS MEDICATIONS: 1% lidocaine local ANESTHESIA/SEDATION: 1.0 mg IV Versed; 50 mcg IV Fentanyl Moderate Sedation Time:  15 minutes The patient was continuously monitored during the procedure by the interventional radiology nurse under my direct supervision. PROCEDURE: The procedure, risks, benefits, and alternatives were explained to the patient. Questions regarding the procedure were encouraged and answered. The patient understands and consents to the procedure. Previous imaging reviewed. Patient position prone. Noncontrast localization CT performed. The left upper lobe mass is locally invasive at the third rib and chest wall. This air was localized and marked. Under  sterile conditions and local anesthesia, a 17 gauge 0.8 cm access needle was advanced from a posterior oblique approach into the left rib/chest wall destructive invasive component. Needle position confirmed with CT. 18 gauge core biopsies obtained. Samples were intact and non fragmented. These were placed in formalin. Patient tolerated the procedure well without complication. Vital sign monitoring by nursing staff during the procedure will continue as patient is in the special procedures unit for post procedure observation. FINDINGS: The images document guide needle placement within the left third rib/chest wall invasive destructive mass. Post biopsy images demonstrate no hemorrhage, hematoma, effusion or pneumothorax. COMPLICATIONS: None immediate. IMPRESSION: Successful CT-guided core biopsy of the left third rib/chest wall invasive destructive mass Electronically Signed   By: Jerilynn Mages.  Shick M.D.   On: 08/08/2019 12:01   DG CHEST PORT 1 VIEW  Result Date: 08/01/2019 CLINICAL DATA:  69 year old male with hypoxia. Oxygen desaturation. Shortness of breath. EXAM: PORTABLE CHEST 1 VIEW COMPARISON:  Chest x-ray 07/30/2019.  Chest CTA 07/30/2019. FINDINGS: Previously noted left upper lobe mass appears larger on today's examination, which could represent growth of the lesion, or may reflect surrounding postobstructive changes. This lesion is again invasive into the left chest wall with gross destruction of the posterolateral aspect of the left third rib. Enlarging small left pleural effusion which appears partially loculated in the periphery of the left base. Increasing areas of ill-defined opacity and interstitial prominence  throughout the lungs bilaterally (left greater than right), likely reflects evolving multilobar pneumonia. Pulmonary vasculature is partially obscured, but there is no evidence to suggest frank pulmonary edema. Heart size is normal. Aortic atherosclerosis. IMPRESSION: 1. Interval worsening of aeration  throughout the lungs bilaterally, concerning for developing multilobar pneumonia. 2. Enlargement of mass in left upper lobe which may reflect progression of the underlying neoplasm but also likely represents worsening adjacent postobstructive changes. Associated with this there is an enlarging small left partially loculated pleural effusion. Electronically Signed   By: Vinnie Langton M.D.   On: 08/01/2019 08:12   DG Chest Portable 1 View  Result Date: 07/30/2019 CLINICAL DATA:  Hypotension. EXAM: PORTABLE CHEST 1 VIEW COMPARISON:  December 29, 2018 FINDINGS: Mild, chronic appearing increased interstitial lung markings are noted. A 10.7 cm x 8.7 cm masslike opacity is seen within the left upper lobe. This is increased in severity when compared to the prior study. There is apparent cortical destruction of the adjacent portion of the third left rib. This represents a new finding compared to the prior exam. There is no evidence of a pleural effusion or pneumothorax. The heart size and mediastinal contours are within normal limits. IMPRESSION: 1. Large opacity within the left upper lobe suspicious for the presence of a large lung mass. The presence of adjacent cortical destruction of the third left rib is suggestive of malignant properties. Correlation with chest CT is recommended. Electronically Signed   By: Virgina Norfolk M.D.   On: 07/30/2019 17:57   ECHOCARDIOGRAM COMPLETE  Result Date: 08/02/2019   ECHOCARDIOGRAM REPORT   Patient Name:   IBRAHEM VOLKMAN Date of Exam: 08/02/2019 Medical Rec #:  [id number]       Height:       75.0 in Accession #:    [id number]      Weight:       175.0 lb Date of Birth:  1951-03-02        BSA:          2.07 m Patient Age:    45 years        BP:           125/73 mmHg Patient Gender: M               HR:           76 bpm. Exam Location:  Inpatient Procedure: 2D Echo Indications:    Dyspnea 786.09/R06.00  History:        Patient has prior history of Echocardiogram examinations, most                  recent 12/30/2014. COPD, Arrythmias:Atrial Fibrillation; Risk                 Factors:Diabetes. 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  Sonographer:    Clayton Lefort RDCS (AE) Referring Phys: [place]  1. Technically diffcult study. Left ventricular ejection fraction, by visual estimation, is grossly 40 to 45%. The left ventricle has mild to moderately decreased function. There is mildly increased left ventricular hypertrophy. Would consider limited echo with contrast to better evaluate systolic function  2. Global right ventricle has normal systolic function.The right ventricular size is normal. No increase in right ventricular wall thickness.  3. Left atrial size was mildly dilated.  4. Right atrial size was normal.  5. The mitral  valve is normal in structure. No evidence of mitral valve regurgitation.  6. The tricuspid valve is normal in structure.  7. The aortic valve is normal in structure. Aortic valve regurgitation is not visualized. No evidence of aortic valve sclerosis or stenosis.  8. The pulmonic valve was not well visualized. Pulmonic valve regurgitation is not visualized.  9. TR signal is inadequate for assessing pulmonary artery systolic pressure. 10. The inferior vena cava is dilated in size with <50% respiratory variability, suggesting right atrial pressure of 15 mmHg. FINDINGS  Left Ventricle: Left ventricular ejection fraction, by visual estimation, is 40 to 45%. The left ventricle has mild to moderately decreased function. The left ventricle demonstrates global hypokinesis. There is mildly increased left ventricular hypertrophy. Right Ventricle: The right ventricular size is normal. No increase in right ventricular wall thickness. Global RV systolic function is has normal systolic function. Left Atrium: Left atrial size was mildly dilated. Right Atrium: Right atrial size was normal in size Pericardium: There is no evidence of pericardial  effusion. Mitral Valve: The mitral valve is normal in structure. No evidence of mitral valve regurgitation. MV peak gradient, 5.9 mmHg. Tricuspid Valve: The tricuspid valve is normal in structure. Tricuspid valve regurgitation is not demonstrated. Aortic Valve: The aortic valve is normal in structure. Aortic valve regurgitation is not visualized. The aortic valve is structurally normal, with no evidence of sclerosis or stenosis. Aortic valve mean gradient measures 3.0 mmHg. Aortic valve peak gradient measures 5.1 mmHg. Aortic valve area, by VTI measures 2.53 cm. Pulmonic Valve: The pulmonic valve was not well visualized. Pulmonic valve regurgitation is not visualized. Pulmonic regurgitation is not visualized. Aorta: The aortic root and ascending aorta are structurally normal, with no evidence of dilitation. Venous: The inferior vena cava is dilated in size with less than 50% respiratory variability, suggesting right atrial pressure of 15 mmHg. IAS/Shunts: The atrial septum is grossly normal.  LEFT VENTRICLE PLAX 2D LVIDd:         4.74 cm  Diastology LVIDs:         4.17 cm  LV e' lateral:   9.90 cm/s LV PW:         1.06 cm  LV E/e' lateral: 9.8 LV IVS:        1.08 cm  LV e' medial:    8.05 cm/s LVOT diam:     2.20 cm  LV E/e' medial:  12.0 LV SV:         27 ml LV SV Index:   13.29 LVOT Area:     3.80 cm  RIGHT VENTRICLE             IVC RV S prime:     12.80 cm/s  IVC diam: 2.47 cm TAPSE (M-mode): 2.9 cm LEFT ATRIUM           Index       RIGHT ATRIUM           Index LA diam:      3.60 cm 1.74 cm/m  RA Area:     13.90 cm LA Vol (A2C): 48.8 ml 23.54 ml/m RA Volume:   32.90 ml  15.87 ml/m LA Vol (A4C): 70.8 ml 34.15 ml/m  AORTIC VALVE AV Area (Vmax):    2.63 cm AV Area (Vmean):   2.32 cm AV Area (VTI):     2.53 cm AV Vmax:           113.00 cm/s AV Vmean:  79.800 cm/s AV VTI:            0.221 m AV Peak Grad:      5.1 mmHg AV Mean Grad:      3.0 mmHg LVOT Vmax:         78.30 cm/s LVOT Vmean:        48.700  cm/s LVOT VTI:          0.147 m LVOT/AV VTI ratio: 0.67  AORTA Ao Root diam: 3.00 cm Ao Asc diam:  3.40 cm MITRAL VALVE MV Area (PHT): 6.27 cm             SHUNTS MV Peak grad:  5.9 mmHg             Systemic VTI:  0.15 m MV Mean grad:  2.0 mmHg             Systemic Diam: 2.20 cm MV Vmax:       1.21 m/s MV Vmean:      61.8 cm/s MV VTI:        0.31 m MV PHT:        35.09 msec MV Decel Time: 121 msec MV E velocity: 96.80 cm/s 103 cm/s MV A velocity: 49.20 cm/s 70.3 cm/s MV E/A ratio:  1.97       1.5  Oswaldo Milian MD Electronically signed by Oswaldo Milian MD Signature Date/Time: 08/02/2019/8:03:44 PM    Final    US Abdomen Limited RUQ  Result Date: 08/02/2019 CLINICAL DATA:  Increasing abdominal pain EXAM: ULTRASOUND ABDOMEN LIMITED RIGHT UPPER QUADRANT COMPARISON:  CT from 07/30/2019 FINDINGS: Gallbladder: Gallbladder is significantly distended with mild wall thickening. No cholelithiasis or pericholecystic fluid is seen. Negative sonographic Murphy's sign is noted. Common bile duct: Diameter: 6 mm. Liver: No focal lesion identified. Within normal limits in parenchymal echogenicity. Portal vein is patent on color Doppler imaging with normal direction of blood flow towards the liver. Mild biliary ductal dilatation is seen consistent with the changes of chronic pancreatitis seen on prior CT. The overall appearance is stable. Other: None. IMPRESSION: Distended gallbladder and mildly the dilated intrahepatic biliary ductal system. These changes are likely related to chronic changes in the head of the pancreas from previous pancreatitis. The overall appearance is similar to that seen on the prior exam. Electronically Signed   By: Inez Catalina M.D.   On: 08/02/2019 21:56   DG C-ARM BRONCHOSCOPY  Result Date: 08/05/2019 C-ARM BRONCHOSCOPY: Fluoroscopy was utilized by the requesting physician.  No radiographic interpretation.     Assessment & Plan:   Moderate COPD (chronic obstructive pulmonary  disease) (HCC) - Current smoker, new dx lung cancer  - PFTs in November 2020- FEV1 2.76 (68%), Ratio 62 - Continue Dulera 200 mcg two puffs twice daily - Adding Spiriva Respimat- 2 puffs once daily  - PRN albuterol hfa every 4-6 hours as needed for breakthrough shortness of breath or wheezing - Recommend Mucinex 600 mg twice daily for 1 to 2 weeks for chest congestion  Acute on chronic respiratory failure with hypoxemia (HCC) - Newly requiring oxygen, referral placed DME in-patient - Unable to check ambulatory O2 d/t hypotension  - Continue 4L [place] continuous 24/7  Mass of upper lobe of left lung - 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 Mohamed/oncology tomorrow January 21st  - Palliative care following   Hypotension - BP 88/52 in office, asymptomatic - Denies dizziness or lightheadedness  -  Plan hold pm metoprolol, check BP in am and if >100/60 resume. If remains low patient is to contact cardiology/PCP for further instruction. Encourage gentle oral hydration.   Atrial fibrillation with rapid ventricular response (HCC) - HR 80s today, appears regular on auscultation  - Started on Amiodarone 284m daily during most recent hospitalization d/t RVR - Holding metoprolol 74mBID tonight d/t hypotension, dose may need to be adjust d/t recent weight loss related to lung cancer  - Continue Eliquis as directed    ElMartyn EhrichNP 08/14/2019

## 2019-08-14 NOTE — Assessment & Plan Note (Addendum)
-   HR 80s today, appears regular on auscultation  - Started on Amiodarone 200mg  daily during most recent hospitalization d/t RVR - Holding metoprolol 75mg  BID tonight d/t hypotension, dose may need to be adjust d/t recent weight loss related to lung cancer  - Continue Eliquis as directed

## 2019-08-14 NOTE — Patient Instructions (Addendum)
It was a pleasure meeting you today Timothy Martinez  Your pulmonary function test in November showed moderate COPD  COPD: Continue Dulera 2 puffs twice daily Adding Spiriva Respimat-take 2 puffs once daily in the morning We will send in albuterol/ProAir rescue inhaler-take 2 puffs every 4-6 hours as needed for breakthrough shortness of breath or wheezing Commend you take Mucinex 600 mg twice daily for 1 to 2 weeks for chest congestion  Respiratory failure with hypoxia: Continue to use 4 L of oxygen continuous We will check ambulatory O2 sats at next visit  Hypotension: Your blood pressure was soft during today's visit  Hold metoprolol tonight Check blood pressure in morning if systolic (top) > 599 Push oral fluids Call EMS if you develop lightheadedness, dizziness or change in mental status  Lung mass: Keep follow-up with Timothy Martinez tomorrow Continue to follow-up with palliative care  Follow-up New patient visit with either Timothy Martinez or Timothy Martinez in 6 to 8 weeks Or sooner if you develop worsening shortness of breath, wheezing or cough

## 2019-08-14 NOTE — Telephone Encounter (Signed)
From what I can tell patient has not been seen by [facility] before in the office and does not appear that we saw in-patient. This should have been a consult.

## 2019-08-14 NOTE — Assessment & Plan Note (Signed)
-   Newly requiring oxygen, referral placed DME in-patient - Unable to check ambulatory O2 d/t hypotension  - Continue 4L [place] continuous 24/7

## 2019-08-15 ENCOUNTER — Other Ambulatory Visit: Payer: Self-pay

## 2019-08-15 ENCOUNTER — Inpatient Hospital Stay: Payer: Medicare Other | Attending: Internal Medicine | Admitting: Internal Medicine

## 2019-08-15 ENCOUNTER — Telehealth: Payer: Self-pay

## 2019-08-15 ENCOUNTER — Other Ambulatory Visit: Payer: Self-pay | Admitting: *Deleted

## 2019-08-15 ENCOUNTER — Ambulatory Visit
Admission: RE | Admit: 2019-08-15 | Discharge: 2019-08-15 | Disposition: A | Discharge status: Home / Self Care | Payer: Medicare Other | Source: Ambulatory Visit | Attending: Radiation Oncology | Admitting: Radiation Oncology

## 2019-08-15 ENCOUNTER — Encounter: Payer: Self-pay | Admitting: Internal Medicine

## 2019-08-15 ENCOUNTER — Inpatient Hospital Stay: Payer: Medicare Other

## 2019-08-15 ENCOUNTER — Other Ambulatory Visit: Payer: Self-pay | Admitting: Medical Oncology

## 2019-08-15 ENCOUNTER — Telehealth: Payer: Self-pay | Admitting: *Deleted

## 2019-08-15 VITALS — BP 94/47 | HR 75 | Temp 98.2°F | Resp 17 | Ht 75.0 in | Wt 144.7 lb

## 2019-08-15 DIAGNOSIS — J449 Chronic obstructive pulmonary disease, unspecified: Secondary | ICD-10-CM | POA: Insufficient documentation

## 2019-08-15 DIAGNOSIS — Z803 Family history of malignant neoplasm of breast: Secondary | ICD-10-CM | POA: Diagnosis not present

## 2019-08-15 DIAGNOSIS — F1011 Alcohol abuse, in remission: Secondary | ICD-10-CM | POA: Insufficient documentation

## 2019-08-15 DIAGNOSIS — R0789 Other chest pain: Secondary | ICD-10-CM | POA: Diagnosis not present

## 2019-08-15 DIAGNOSIS — G893 Neoplasm related pain (acute) (chronic): Secondary | ICD-10-CM

## 2019-08-15 DIAGNOSIS — R911 Solitary pulmonary nodule: Secondary | ICD-10-CM

## 2019-08-15 DIAGNOSIS — C3490 Malignant neoplasm of unspecified part of unspecified bronchus or lung: Secondary | ICD-10-CM

## 2019-08-15 DIAGNOSIS — I4891 Unspecified atrial fibrillation: Secondary | ICD-10-CM | POA: Diagnosis not present

## 2019-08-15 DIAGNOSIS — C3412 Malignant neoplasm of upper lobe, left bronchus or lung: Secondary | ICD-10-CM | POA: Insufficient documentation

## 2019-08-15 DIAGNOSIS — Z7984 Long term (current) use of oral hypoglycemic drugs: Secondary | ICD-10-CM | POA: Insufficient documentation

## 2019-08-15 DIAGNOSIS — I110 Hypertensive heart disease with heart failure: Secondary | ICD-10-CM | POA: Diagnosis not present

## 2019-08-15 DIAGNOSIS — E119 Type 2 diabetes mellitus without complications: Secondary | ICD-10-CM | POA: Diagnosis not present

## 2019-08-15 DIAGNOSIS — R079 Chest pain, unspecified: Secondary | ICD-10-CM

## 2019-08-15 DIAGNOSIS — Z992 Dependence on renal dialysis: Secondary | ICD-10-CM

## 2019-08-15 DIAGNOSIS — Z9981 Dependence on supplemental oxygen: Secondary | ICD-10-CM | POA: Diagnosis not present

## 2019-08-15 DIAGNOSIS — F17210 Nicotine dependence, cigarettes, uncomplicated: Secondary | ICD-10-CM | POA: Diagnosis not present

## 2019-08-15 DIAGNOSIS — Z7189 Other specified counseling: Secondary | ICD-10-CM

## 2019-08-15 DIAGNOSIS — Z801 Family history of malignant neoplasm of trachea, bronchus and lung: Secondary | ICD-10-CM | POA: Insufficient documentation

## 2019-08-15 DIAGNOSIS — R0609 Other forms of dyspnea: Secondary | ICD-10-CM

## 2019-08-15 DIAGNOSIS — I509 Heart failure, unspecified: Secondary | ICD-10-CM | POA: Diagnosis not present

## 2019-08-15 LAB — CBC WITH DIFFERENTIAL (CANCER CENTER ONLY)
Abs Immature Granulocytes: 0.16 10*3/uL — ABNORMAL HIGH (ref 0.00–0.07)
Basophils Absolute: 0.1 10*3/uL (ref 0.0–0.1)
Basophils Relative: 0 %
Eosinophils Absolute: 0 10*3/uL (ref 0.0–0.5)
Eosinophils Relative: 0 %
HCT: 27.9 % — ABNORMAL LOW (ref 39.0–52.0)
Hemoglobin: 8.8 g/dL — ABNORMAL LOW (ref 13.0–17.0)
Immature Granulocytes: 1 %
Lymphocytes Relative: 10 %
Lymphs Abs: 1.6 10*3/uL (ref 0.7–4.0)
MCH: 26.8 pg (ref 26.0–34.0)
MCHC: 31.5 g/dL (ref 30.0–36.0)
MCV: 85.1 fL (ref 80.0–100.0)
Monocytes Absolute: 1.5 10*3/uL — ABNORMAL HIGH (ref 0.1–1.0)
Monocytes Relative: 9 %
Neutro Abs: 13 10*3/uL — ABNORMAL HIGH (ref 1.7–7.7)
Neutrophils Relative %: 80 %
Platelet Count: 652 10*3/uL — ABNORMAL HIGH (ref 150–400)
RBC: 3.28 MIL/uL — ABNORMAL LOW (ref 4.22–5.81)
RDW: 17.1 % — ABNORMAL HIGH (ref 11.5–15.5)
WBC Count: 16.4 10*3/uL — ABNORMAL HIGH (ref 4.0–10.5)
nRBC: 0.4 % — ABNORMAL HIGH (ref 0.0–0.2)

## 2019-08-15 LAB — CMP (CANCER CENTER ONLY)
ALT: 10 U/L (ref 0–44)
AST: 27 U/L (ref 15–41)
Albumin: 1.8 g/dL — ABNORMAL LOW (ref 3.5–5.0)
Alkaline Phosphatase: 143 U/L — ABNORMAL HIGH (ref 38–126)
Anion gap: 9 (ref 5–15)
BUN: 29 mg/dL — ABNORMAL HIGH (ref 8–23)
CO2: 28 mmol/L (ref 22–32)
Calcium: 8.8 mg/dL — ABNORMAL LOW (ref 8.9–10.3)
Chloride: 102 mmol/L (ref 98–111)
Creatinine: 0.81 mg/dL (ref 0.61–1.24)
GFR, Est AFR Am: >60 mL/min (ref >60)
GFR, Estimated: >60 mL/min (ref >60)
Glucose, Bld: 121 mg/dL — ABNORMAL HIGH (ref 70–99)
Potassium: 4.8 mmol/L (ref 3.5–5.1)
Sodium: 139 mmol/L (ref 135–145)
Total Bilirubin: 0.3 mg/dL (ref 0.3–1.2)
Total Protein: 6.7 g/dL (ref 6.5–8.1)

## 2019-08-15 NOTE — Telephone Encounter (Signed)
Pts daughter Henrietta Dine) calls requesting orders for Hospice care. Per daughter, patient has decided not to pursue treatment and would like to receive hospice services.   To PCP   Talbot Grumbling, RN

## 2019-08-15 NOTE — Telephone Encounter (Signed)
Pt seen on 08/14/2019. Will sign off.

## 2019-08-15 NOTE — Progress Notes (Signed)
[place] [phone]   [phone] Multidisciplinary thoracic oncology clinic CONSULT NOTE  REFERRING PHYSICIAN: Dr. Modesto Charon  REASON FOR CONSULTATION:  69 years old white male recently diagnosed with lung cancer.  HPI Timothy Martinez is a 69 y.o. male with past medical history significant for multiple medical problems including congestive heart failure, atrial fibrillation, COPD, diabetes mellitus, alcohol and tobacco abuse.  The patient was admitted to [facility] on July 29, 2018 with hypotension, shortness of breath.  He was found to have anemia and on imaging studies including CT angiogram of the chest as well as CT scan of the abdomen pelvis showed a left upper lobe focal soft tissue mass lesion measuring approximately 11.8 x 8.2 cm in greatest dimension.  It has some extension to the left hilum and there was associated the changes involving the posterior lateral aspect of the left third rib consistent with localized invasion and metastatic disease.  There also appears to be soft tissue density between the third and fourth ribs laterally consistent with the chest wall invasion.  There was associated mediastinal and hilar adenopathy seen and a stable nodule in the right upper lobe that is unchanged since 2019.  The patient was seen by Dr. Roxan Hockey.  On August 04, 2010 the patient had bronchoscopy with endobronchial ultrasound, brushing and needle aspiration and transbronchial biopsy as well as endobronchial ultrasound with mediastinal lymph node aspiration but the final cytology and biopsy were negative for malignancy.  On August 08, 2019 the patient underwent CT-guided biopsy of the left rib/chest wall invasive mass by interventional radiology.  The final pathology ([id number] ) showed the squamous cell carcinoma with sarcomatoid features. The patient was referred to me today for evaluation and recommendation regarding treatment  of his condition.  When seen today he continues to have the persistent left-sided chest pain as well as shortness of breath at baseline increased with exertion and he is currently on home oxygen.  He also has cough with no hemoptysis.  He lost around 90 pounds over the last 12 months.  He has no nausea, vomiting, diarrhea or constipation.  He denied having any headache or visual changes. Family history Sister had breast and lung cancer, mother had hypothyroidism. The patient is divorced and has 3 children.  He was accompanied today by his daughter Timothy Martinez.  He used to work as a Administrator.  He has a history of smoking 3 packs/day for around 55 years and unfortunately he continues to smoke.  He also has a history of alcohol abuse but not recently and no history of drug abuse.  HPI  Past Medical History:  Diagnosis Date   Acute on chronic pancreatitis ([place]) 05/17/2016   Alcohol abuse    Alcoholic pancreatitis    Anginal pain ([place])    Anxiety and depression 01/20/2015   PHQ 9 = 11 (01/20/15)     Arthritis    "eat up w/it" (05/17/2016)   Atrial fibrillation status post cardioversion [facility]) 08/2014   Atrial flutter, unspecified    CHF (congestive heart failure) (HCC)    Chronic anticoagulation    Chronic atrial fibrillation (HCC)    Chronic bronchitis (HCC)    Chronic left-sided low back pain with sciatica 12/21/2012   Chronic pancreatitis ([place])    COLD (chronic obstructive lung disease) ([place])    Complication of anesthesia    pt C/O confusion after procedure in April 2020   Daily headache    "need eye  examined" (05/17/2016)   Gastric outlet obstruction 10/2018   GERD (gastroesophageal reflux disease)    Hematuria, microscopic 02/13/2015   Noted on UA - Recheck UA at next visit ~ 1 months    Hyperlipidemia    Hypertension    Insomnia 02/11/2016   Lateral epicondylitis of right elbow 03/17/2017   Left foot pain 10/12/2013   Loss of weight 02/19/2018   Low back pain  12/21/2012   Malnutrition of moderate degree 05/18/2016   Neck pain, bilateral posterior 10/15/2014   Neuropathy 03/16/2015   Pain of joint of left ankle and foot 06/13/2018   Pain of right upper extremity 05/30/2016   Pancreatic pseudocyst    seen on CT scan 07/2014   Pancreatitis 08/04/2014   Permanent atrial fibrillation ([place]) 05/05/2017   Pneumonia    "?a few times" (05/17/2016)   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   Tobacco abuse 05/30/2011   Heavy smoker up to 3 ppd down to 15 cigs per day in 09/2014    Type II diabetes mellitus ([place])    Wears dentures    Wears glasses     Past Surgical History:  Procedure Laterality Date   BACK SURGERY     BIOPSY  11/20/2018   Procedure: BIOPSY;  Surgeon: Irving Copas., MD;  Location: [facility] ENDOSCOPY;  Service: Gastroenterology;;   BIOPSY  02/04/2019   Procedure: BIOPSY;  Surgeon: Irving Copas., MD;  Location: [place];  Service: Gastroenterology;;   CARDIOVERSION N/A 09/22/2014   Procedure: CARDIOVERSION;  Surgeon: Thayer Headings, MD;  Location: [place];  Service: Cardiovascular;  Laterality: N/A;   COLONOSCOPY W/ POLYPECTOMY  2019   ERCP N/A 02/04/2019   Procedure: ENDOSCOPIC RETROGRADE CHOLANGIOPANCREATOGRAPHY (ERCP);  Surgeon: Irving Copas., MD;  Location: [place];  Service: Gastroenterology;  Laterality: N/A;   ESOPHAGOGASTRODUODENOSCOPY (EGD) WITH PROPOFOL N/A 11/20/2018   Procedure: ESOPHAGOGASTRODUODENOSCOPY (EGD) WITH PROPOFOL;  Surgeon: Rush Landmark Telford Nab., MD;  Location: [place];  Service: Gastroenterology;  Laterality: N/A;   ESOPHAGOGASTRODUODENOSCOPY (EGD) WITH PROPOFOL N/A 02/04/2019   Procedure: ESOPHAGOGASTRODUODENOSCOPY (EGD) WITH PROPOFOL;  Surgeon: Rush Landmark Telford Nab., MD;  Location: [place];  Service: Gastroenterology;  Laterality: N/A;   FOREIGN BODY REMOVAL  11/20/2018   Procedure: FOREIGN BODY REMOVAL;  Surgeon: Irving Copas., MD;  Location: [facility]  ENDOSCOPY;  Service: Gastroenterology;;   [place]   not cervical, states had lesion on neck which was removed   POSTERIOR LAMINECTOMY / [place]  10/2006   of L4, L5 and S1 with a 5-1 diskectomy, microdissection with the microscope/notes 10/24/2006   SPLENECTOMY  2003   UPPER ESOPHAGEAL ENDOSCOPIC ULTRASOUND (EUS) N/A 11/20/2018   Procedure: UPPER ESOPHAGEAL ENDOSCOPIC ULTRASOUND (EUS);  Surgeon: Irving Copas., MD;  Location: [place];  Service: Gastroenterology;  Laterality: N/A;   UPPER ESOPHAGEAL ENDOSCOPIC ULTRASOUND (EUS) N/A 02/04/2019   Procedure: UPPER ESOPHAGEAL ENDOSCOPIC ULTRASOUND (EUS);  Surgeon: Irving Copas., MD;  Location: [place];  Service: Gastroenterology;  Laterality: N/A;   VIDEO BRONCHOSCOPY WITH ENDOBRONCHIAL ULTRASOUND N/A 08/05/2019   Procedure: Video Bronchoscopy With Endobronchial Ultrasound;  Surgeon: [name], MD;  Location: [place];  Service: Thoracic;  Laterality: N/A;   VIDEO BRONCHOSCOPY WITH RADIAL ENDOBRONCHIAL ULTRASOUND  08/05/2019   Procedure: Video Bronchoscopy With Radial Endobronchial Ultrasound;  Surgeon: [name], MD;  Location: [facility] OR;  Service: Thoracic;;    Family History  Problem Relation Age of Onset   Diabetes Mother    Aneurysm Mother  Stroke Mother    Hypertension Mother    Alcohol abuse Father    Cancer Father        Lung   Cancer Sister        Lung Cancer   Post-traumatic stress disorder Brother    Heart attack Neg Hx    Colon cancer Neg Hx    Liver cancer Neg Hx    Rectal cancer Neg Hx    Esophageal cancer Neg Hx    Stomach cancer Neg Hx    Inflammatory bowel disease Neg Hx     Social History Social History   Tobacco Use   Smoking status: Current Every Day Smoker    Packs/day: 0.50    Years: 52.00    Pack years: 26.00    Types: Cigarettes    Start date: 04/09/1964   Smokeless tobacco: Never Used   Tobacco comment:  down to one pack daily  Substance Use Topics   Alcohol use: Not Currently    Comment: QUIT IN 02/2018   Drug use: No    Allergies  Allergen Reactions   Prozac [Fluoxetine Hcl] Nausea Only    Made the patient very sick to his stomach   Morphine And Related Swelling    Pt states he can tolerate oxycodone and hydromorphone   Penicillins Rash    Tolerated cefazolin 2021 Has patient had a PCN reaction causing immediate rash, facial/tongue/throat swelling, SOB or lightheadedness with hypotension: Yes Has patient had a PCN reaction causing severe rash involving mucus membranes or skin necrosis: No Has patient had a PCN reaction that required hospitalization: No Has patient had a PCN reaction occurring within the last 10 years: No If all of the above answers are "NO", then may proceed with Cephalosporin use.     Current Outpatient Medications  Medication Sig Dispense Refill   albuterol (VENTOLIN HFA) 108 (90 Base) MCG/ACT inhaler Inhale 2 puffs into the lungs every 6 (six) hours as needed for wheezing or shortness of breath. 8 g 3   amiodarone (PACERONE) 200 MG tablet Take 2 tablets (400 mg total) by mouth 2 (two) times daily for 1 day, THEN 1 tablet (200 mg total) daily for 28 days. 32 tablet 0   amitriptyline (ELAVIL) 10 MG tablet TAKE 1 TABLET BY MOUTH EVERYDAY AT BEDTIME (Patient taking differently: Take 10 mg by mouth at bedtime. ) 90 tablet 1   atorvastatin (LIPITOR) 40 MG tablet Take 0.5 tablets (20 mg total) by mouth daily at 6 PM. (Patient taking differently: Take 40 mg by mouth daily at 6 PM. ) 90 tablet 3   Blood Glucose Monitoring Suppl (ONE TOUCH ULTRA 2) w/Device KIT Dispense one glucometer. Diagnosis Type 2 Diabetes. E11.9 1 each 0   famotidine (PEPCID) 20 MG tablet TAKE 1 TABLET BY MOUTH TWICE A DAY (Patient taking differently: Take 20 mg by mouth every morning. ) 30 tablet 3   feeding supplement, ENSURE ENLIVE, (ENSURE ENLIVE) LIQD Take 237 mLs by mouth 3 (three)  times daily between meals. 237 mL 12   gabapentin (NEURONTIN) 100 MG capsule TAKE 1 CAPSULE BY MOUTH THREE TIMES A DAY (Patient taking differently: Take 100 mg by mouth 2 (two) times daily. ) 270 capsule 2   glucose blood (ONE TOUCH ULTRA TEST) test strip Use check fasting blood glucose qAM. Type 2 Diabetes E11.9 100 each 3   guaiFENesin (MUCINEX) 600 MG 12 hr tablet Take 1 tablet (600 mg total) by mouth 2 (two) times daily. 30 tablet 1  Lancets (FREESTYLE) lancets Use as instructed 100 each 12   Lancets (ONETOUCH ULTRASOFT) lancets Use as instructed 100 each 12   lipase/protease/amylase (CREON) 36000 UNITS CPEP capsule Take 36,000-72,000 capsules by mouth See admin instructions. 2 caps with each meal, 1 capsule with snacks     metFORMIN (GLUCOPHAGE) 1000 MG tablet Take 1 tablet (1,000 mg total) by mouth 2 (two) times daily with a meal. 180 tablet 3   metoprolol tartrate (LOPRESSOR) 50 MG tablet Take 1.5 tablets (75 mg total) by mouth 2 (two) times daily. 270 tablet 3   mometasone-formoterol (DULERA) 200-5 MCG/ACT AERO Inhale 2 puffs into the lungs 2 (two) times daily. 3 Inhaler 1   Multiple Vitamin (MULTIVITAMIN WITH MINERALS) TABS tablet Take 1 tablet by mouth daily.     naproxen sodium (ALEVE) 220 MG tablet Take 880 mg by mouth 2 (two) times daily as needed (pain).     nitroGLYCERIN (NITROSTAT) 0.4 MG SL tablet Place 1 tablet (0.4 mg total) under the tongue every 5 (five) minutes as needed for chest pain. 100 tablet 3   ondansetron (ZOFRAN ODT) 4 MG disintegrating tablet Take 1 tablet (4 mg total) by mouth every 8 (eight) hours as needed for nausea or vomiting. 10 tablet 0   oxyCODONE (OXY IR/ROXICODONE) 5 MG immediate release tablet Take 1 tablet (5 mg total) by mouth every 6 (six) hours as needed for severe pain. 30 tablet 0   pantoprazole (PROTONIX) 40 MG tablet Take 1 tablet (40 mg total) by mouth daily. 90 tablet 3   polyethylene glycol (MIRALAX / GLYCOLAX) 17 g packet TAKE  17 G BY MOUTH DAILY. (Patient taking differently: Take 17 g by mouth daily as needed for moderate constipation. ) 14 packet 0   prochlorperazine (COMPAZINE) 10 MG tablet Take 1 tablet (10 mg total) by mouth every 8 (eight) hours as needed for nausea or vomiting. 30 tablet 2   tamsulosin (FLOMAX) 0.4 MG CAPS capsule Take 1 capsule (0.4 mg total) by mouth daily. 30 capsule 3   Tiotropium Bromide Monohydrate (SPIRIVA RESPIMAT) 2.5 MCG/ACT AERS Inhale 2 puffs into the lungs 2 (two) times daily. 4 g 0   XARELTO 20 MG TABS tablet TAKE 1 TABLET BY MOUTH DAILY WITH SUPPER (Patient taking differently: Take 20 mg by mouth daily with supper. ) 90 tablet 1   No current facility-administered medications for this visit.    Review of Systems  Constitutional: positive for anorexia, fatigue and weight loss Eyes: negative Ears, nose, mouth, throat, and face: negative Respiratory: positive for cough, dyspnea on exertion and pleurisy/chest pain Cardiovascular: negative Gastrointestinal: negative Genitourinary:negative Integument/breast: negative Hematologic/lymphatic: negative Musculoskeletal:positive for muscle weakness Neurological: negative Behavioral/Psych: negative Endocrine: negative Allergic/Immunologic: negative  Physical Exam  YME:BRAXE, healthy, no distress, well nourished, well developed, cachectic, ill looking and malnourished SKIN: skin color, texture, turgor are normal, no rashes or significant lesions HEAD: Normocephalic, No masses, lesions, tenderness or abnormalities EYES: normal, PERRLA, Conjunctiva are pink and non-injected EARS: External ears normal, Canals clear OROPHARYNX:no exudate, no erythema and lips, buccal mucosa, and tongue normal  NECK: supple, no adenopathy, no JVD LYMPH:  no palpable lymphadenopathy, no hepatosplenomegaly LUNGS: clear to auscultation , and palpation HEART: regular rate & rhythm, no murmurs and no gallops ABDOMEN:abdomen soft, non-tender, normal  bowel sounds and no masses or organomegaly BACK: No CVA tenderness, Range of motion is normal EXTREMITIES:no joint deformities, effusion, or inflammation, no edema  NEURO: alert & oriented x 3 with fluent speech, no focal motor/sensory deficits  PERFORMANCE STATUS: ECOG 1-2  LABORATORY DATA: Lab Results  Component Value Date   WBC 16.4 (H) 08/15/2019   HGB 8.8 (L) 08/15/2019   HCT 27.9 (L) 08/15/2019   MCV 85.1 08/15/2019   PLT 652 (H) 08/15/2019      Chemistry      Component Value Date/Time   NA 139 08/09/2019 0221   NA 140 03/29/2019 1027   K 3.6 08/09/2019 0221   CL 101 08/09/2019 0221   CO2 28 08/09/2019 0221   BUN 19 08/09/2019 0221   BUN 10 03/29/2019 1027   CREATININE 0.73 08/09/2019 0221   CREATININE 0.81 02/11/2016 1517      Component Value Date/Time   CALCIUM 8.4 (L) 08/09/2019 0221   ALKPHOS 157 (H) 08/09/2019 0221   AST 21 08/09/2019 0221   ALT 18 08/09/2019 0221   BILITOT 1.0 08/09/2019 0221   BILITOT 0.3 03/29/2019 1027       RADIOGRAPHIC STUDIES: DG Chest 2 View  Result Date: 08/03/2019 CLINICAL DATA:  Significant atrial fibrillation. Alcoholic pancreatitis. EXAM: CHEST - 2 VIEW COMPARISON:  August 01, 2019 FINDINGS: The mass in the left upper lobe with destruction of the left posterior third rib is similar in the interval. Bilateral pulmonary opacities are diffuse and similar in the interval. No other interval changes. IMPRESSION: 1. The left upper lobe mass with destruction of the posterior left third rib is stable. 2. Diffuse bilateral pulmonary infiltrates are similar in the interval. Electronically Signed   By: Dorise Bullion III M.D   On: 08/03/2019 10:16   CT Angio Chest PE W and/or Wo Contrast  Result Date: 07/30/2019 CLINICAL DATA:  Shortness of breath and near syncopal episode, mass lesion on recent chest x-ray EXAM: CT ANGIOGRAPHY CHEST CT ABDOMEN AND PELVIS WITH CONTRAST TECHNIQUE: Multidetector CT imaging of the chest was performed using the  standard protocol during bolus administration of intravenous contrast. Multiplanar CT image reconstructions and MIPs were obtained to evaluate the vascular anatomy. Multidetector CT imaging of the abdomen and pelvis was performed using the standard protocol during bolus administration of intravenous contrast. CONTRAST:  138m OMNIPAQUE IOHEXOL 350 MG/ML SOLN COMPARISON:  Prior chest x-ray from earlier in the same day CT from 03/30/2018. FINDINGS: CTA CHEST FINDINGS Cardiovascular: Thoracic aorta demonstrates atherosclerotic calcifications without aneurysmal dilatation. No cardiac enlargement is noted. Mild coronary calcifications are noted. The pulmonary artery is well visualized within normal enhancement pattern. No filling defect to suggest pulmonary embolism is seen. Mediastinum/Nodes: Thoracic inlet shows a hypodensity within the left lobe of the thyroid. This measures approximately 14 mm in greatest dimension. The esophagus as visualized is within normal limits. Scattered small mediastinal lymph nodes are noted primarily within the AP window. Small hilar adenopathy is noted bilaterally. Lungs/Pleura: In the left upper lobe there is a focal soft tissue mass lesion which measures approximately 11.8 x 8.2 cm in greatest AP and transverse dimensions respectively. It has some extension to the left hilum and there are associated changes involving the posterolateral aspect of the left third rib consistent with localized invasion and metastatic disease. There also appears to be soft tissue density between the third and fourth ribs laterally consistent with chest wall invasion. Emphysematous changes are noted with evidence of subpleural fibrosis similar to that seen on prior chest x-rays. 7 mm nodule is noted in the right upper lobe best seen on image number 76 of series 4. This is stable from a prior CT examination. Musculoskeletal: As described above there are destructive changes  of the posterolateral aspect of the  left third rib consistent with localized neoplastic invasion. No other specific rib abnormality is noted. The thoracic spine demonstrates degenerative change without lytic lesion or compression deformity. Review of the MIP images confirms the above findings. CT ABDOMEN and PELVIS FINDINGS Hepatobiliary: Gallbladder is well distended. Mild biliary ductal dilatation is seen secondary to changes of chronic pancreatitis in the head of the pancreas. These changes are similar to that seen on prior abdominal CT. The liver demonstrates some fatty infiltration. No definitive mass is seen. Pancreas: Chronic changes are noted in the pancreas with pancreatic ductal dilatation stable from the prior exam. Multiple calcifications are seen in the head and uncinate process consistent with prior chronic pancreatitis. Previously seen pseudocyst in the head of the pancreas is not as well visualized on today's exam. Spleen: Surgically removed Adrenals/Urinary Tract: Adrenal glands are within normal limits. The kidneys demonstrate a normal enhancement pattern bilaterally. No definitive calculi are seen. Delayed images demonstrate dense nephrograms without significant contrast enhancement. This is of uncertain significance but may be related to incomplete delayed images. No elevated creatinine is seen. The bladder is partially distended. Stomach/Bowel: No obstructive or inflammatory changes of the colon are seen. The appendix is not well visualized. No obstructive changes of the small bowel are noted. Stomach is within normal limits. Vascular/Lymphatic: Aortic atherosclerosis. No enlarged abdominal or pelvic lymph nodes. Reproductive: Prostate is unremarkable. Other: No abdominal wall hernia or abnormality. No abdominopelvic ascites. Musculoskeletal: Degenerative changes of lumbar spine are noted. No metastatic lesions are seen. Review of the MIP images confirms the above findings. IMPRESSION: Large left upper lobe mass lesion with  apparent chest wall invasion in rib destruction as described consistent with a primary pulmonary neoplasm till proven otherwise. Associated mediastinal and hilar adenopathy is seen. Stable nodule in the right upper lobe unchanged from 2019. No evidence of pulmonary emboli. Hypodense lesion within the left lobe of the thyroid. No followup recommended (ref: J Am Coll Radiol. 2015 Feb;12(2): 143-50). Chronic changes in the pancreas consistent with chronic pancreatitis. Pancreatic ductal dilatation in biliary ductal dilatation is seen. Chronic changes in the abdomen and pelvis stable from previous exams. Electronically Signed   By: Inez Catalina M.D.   On: 07/30/2019 20:17   CT ABDOMEN PELVIS W CONTRAST  Result Date: 07/30/2019 CLINICAL DATA:  Shortness of breath and near syncopal episode, mass lesion on recent chest x-ray EXAM: CT ANGIOGRAPHY CHEST CT ABDOMEN AND PELVIS WITH CONTRAST TECHNIQUE: Multidetector CT imaging of the chest was performed using the standard protocol during bolus administration of intravenous contrast. Multiplanar CT image reconstructions and MIPs were obtained to evaluate the vascular anatomy. Multidetector CT imaging of the abdomen and pelvis was performed using the standard protocol during bolus administration of intravenous contrast. CONTRAST:  192m OMNIPAQUE IOHEXOL 350 MG/ML SOLN COMPARISON:  Prior chest x-ray from earlier in the same day CT from 03/30/2018. FINDINGS: CTA CHEST FINDINGS Cardiovascular: Thoracic aorta demonstrates atherosclerotic calcifications without aneurysmal dilatation. No cardiac enlargement is noted. Mild coronary calcifications are noted. The pulmonary artery is well visualized within normal enhancement pattern. No filling defect to suggest pulmonary embolism is seen. Mediastinum/Nodes: Thoracic inlet shows a hypodensity within the left lobe of the thyroid. This measures approximately 14 mm in greatest dimension. The esophagus as visualized is within normal  limits. Scattered small mediastinal lymph nodes are noted primarily within the AP window. Small hilar adenopathy is noted bilaterally. Lungs/Pleura: In the left upper lobe there is a focal soft tissue  mass lesion which measures approximately 11.8 x 8.2 cm in greatest AP and transverse dimensions respectively. It has some extension to the left hilum and there are associated changes involving the posterolateral aspect of the left third rib consistent with localized invasion and metastatic disease. There also appears to be soft tissue density between the third and fourth ribs laterally consistent with chest wall invasion. Emphysematous changes are noted with evidence of subpleural fibrosis similar to that seen on prior chest x-rays. 7 mm nodule is noted in the right upper lobe best seen on image number 76 of series 4. This is stable from a prior CT examination. Musculoskeletal: As described above there are destructive changes of the posterolateral aspect of the left third rib consistent with localized neoplastic invasion. No other specific rib abnormality is noted. The thoracic spine demonstrates degenerative change without lytic lesion or compression deformity. Review of the MIP images confirms the above findings. CT ABDOMEN and PELVIS FINDINGS Hepatobiliary: Gallbladder is well distended. Mild biliary ductal dilatation is seen secondary to changes of chronic pancreatitis in the head of the pancreas. These changes are similar to that seen on prior abdominal CT. The liver demonstrates some fatty infiltration. No definitive mass is seen. Pancreas: Chronic changes are noted in the pancreas with pancreatic ductal dilatation stable from the prior exam. Multiple calcifications are seen in the head and uncinate process consistent with prior chronic pancreatitis. Previously seen pseudocyst in the head of the pancreas is not as well visualized on today's exam. Spleen: Surgically removed Adrenals/Urinary Tract: Adrenal glands  are within normal limits. The kidneys demonstrate a normal enhancement pattern bilaterally. No definitive calculi are seen. Delayed images demonstrate dense nephrograms without significant contrast enhancement. This is of uncertain significance but may be related to incomplete delayed images. No elevated creatinine is seen. The bladder is partially distended. Stomach/Bowel: No obstructive or inflammatory changes of the colon are seen. The appendix is not well visualized. No obstructive changes of the small bowel are noted. Stomach is within normal limits. Vascular/Lymphatic: Aortic atherosclerosis. No enlarged abdominal or pelvic lymph nodes. Reproductive: Prostate is unremarkable. Other: No abdominal wall hernia or abnormality. No abdominopelvic ascites. Musculoskeletal: Degenerative changes of lumbar spine are noted. No metastatic lesions are seen. Review of the MIP images confirms the above findings. IMPRESSION: Large left upper lobe mass lesion with apparent chest wall invasion in rib destruction as described consistent with a primary pulmonary neoplasm till proven otherwise. Associated mediastinal and hilar adenopathy is seen. Stable nodule in the right upper lobe unchanged from 2019. No evidence of pulmonary emboli. Hypodense lesion within the left lobe of the thyroid. No followup recommended (ref: J Am Coll Radiol. 2015 Feb;12(2): 143-50). Chronic changes in the pancreas consistent with chronic pancreatitis. Pancreatic ductal dilatation in biliary ductal dilatation is seen. Chronic changes in the abdomen and pelvis stable from previous exams. Electronically Signed   By: Inez Catalina M.D.   On: 07/30/2019 20:17   CT BIOPSY  Result Date: 08/08/2019 INDICATION: Large left upper lobe mass with chest wall and left rib invasion EXAM: CT-GUIDED BIOPSY LEFT RIB/CHEST WALL INVASIVE MASS MEDICATIONS: 1% lidocaine local ANESTHESIA/SEDATION: 1.0 mg IV Versed; 50 mcg IV Fentanyl Moderate Sedation Time:  15 minutes The  patient was continuously monitored during the procedure by the interventional radiology nurse under my direct supervision. PROCEDURE: The procedure, risks, benefits, and alternatives were explained to the patient. Questions regarding the procedure were encouraged and answered. The patient understands and consents to the procedure. Previous imaging reviewed.  Patient position prone. Noncontrast localization CT performed. The left upper lobe mass is locally invasive at the third rib and chest wall. This air was localized and marked. Under sterile conditions and local anesthesia, a 17 gauge 0.8 cm access needle was advanced from a posterior oblique approach into the left rib/chest wall destructive invasive component. Needle position confirmed with CT. 18 gauge core biopsies obtained. Samples were intact and non fragmented. These were placed in formalin. Patient tolerated the procedure well without complication. Vital sign monitoring by nursing staff during the procedure will continue as patient is in the special procedures unit for post procedure observation. FINDINGS: The images document guide needle placement within the left third rib/chest wall invasive destructive mass. Post biopsy images demonstrate no hemorrhage, hematoma, effusion or pneumothorax. COMPLICATIONS: None immediate. IMPRESSION: Successful CT-guided core biopsy of the left third rib/chest wall invasive destructive mass Electronically Signed   By: Jerilynn Mages.  Shick M.D.   On: 08/08/2019 12:01   DG CHEST PORT 1 VIEW  Result Date: 08/01/2019 CLINICAL DATA:  69 year old male with hypoxia. Oxygen desaturation. Shortness of breath. EXAM: PORTABLE CHEST 1 VIEW COMPARISON:  Chest x-ray 07/30/2019.  Chest CTA 07/30/2019. FINDINGS: Previously noted left upper lobe mass appears larger on today's examination, which could represent growth of the lesion, or may reflect surrounding postobstructive changes. This lesion is again invasive into the left chest wall with gross  destruction of the posterolateral aspect of the left third rib. Enlarging small left pleural effusion which appears partially loculated in the periphery of the left base. Increasing areas of ill-defined opacity and interstitial prominence throughout the lungs bilaterally (left greater than right), likely reflects evolving multilobar pneumonia. Pulmonary vasculature is partially obscured, but there is no evidence to suggest frank pulmonary edema. Heart size is normal. Aortic atherosclerosis. IMPRESSION: 1. Interval worsening of aeration throughout the lungs bilaterally, concerning for developing multilobar pneumonia. 2. Enlargement of mass in left upper lobe which may reflect progression of the underlying neoplasm but also likely represents worsening adjacent postobstructive changes. Associated with this there is an enlarging small left partially loculated pleural effusion. Electronically Signed   By: Vinnie Langton M.D.   On: 08/01/2019 08:12   DG Chest Portable 1 View  Result Date: 07/30/2019 CLINICAL DATA:  Hypotension. EXAM: PORTABLE CHEST 1 VIEW COMPARISON:  December 29, 2018 FINDINGS: Mild, chronic appearing increased interstitial lung markings are noted. A 10.7 cm x 8.7 cm masslike opacity is seen within the left upper lobe. This is increased in severity when compared to the prior study. There is apparent cortical destruction of the adjacent portion of the third left rib. This represents a new finding compared to the prior exam. There is no evidence of a pleural effusion or pneumothorax. The heart size and mediastinal contours are within normal limits. IMPRESSION: 1. Large opacity within the left upper lobe suspicious for the presence of a large lung mass. The presence of adjacent cortical destruction of the third left rib is suggestive of malignant properties. Correlation with chest CT is recommended. Electronically Signed   By: Virgina Norfolk M.D.   On: 07/30/2019 17:57   ECHOCARDIOGRAM  COMPLETE  Result Date: 08/02/2019   ECHOCARDIOGRAM REPORT   Patient Name:   HAWKEN BIELBY Date of Exam: 08/02/2019 Medical Rec #:  [id number]       Height:       75.0 in Accession #:    [id number]      Weight:       175.0 lb Date of  Birth:  1950/09/11        BSA:          2.07 m Patient Age:    72 years        BP:           125/73 mmHg Patient Gender: M               HR:           76 bpm. Exam Location:  Inpatient Procedure: 2D Echo Indications:    Dyspnea 786.09/R06.00  History:        Patient has prior history of Echocardiogram examinations, most                 recent 12/30/2014. COPD, Arrythmias:Atrial Fibrillation; Risk                 Factors:Diabetes. 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  Sonographer:    Clayton Lefort RDCS (AE) Referring Phys: [place]  1. Technically diffcult study. Left ventricular ejection fraction, by visual estimation, is grossly 40 to 45%. The left ventricle has mild to moderately decreased function. There is mildly increased left ventricular hypertrophy. Would consider limited echo with contrast to better evaluate systolic function  2. Global right ventricle has normal systolic function.The right ventricular size is normal. No increase in right ventricular wall thickness.  3. Left atrial size was mildly dilated.  4. Right atrial size was normal.  5. The mitral valve is normal in structure. No evidence of mitral valve regurgitation.  6. The tricuspid valve is normal in structure.  7. The aortic valve is normal in structure. Aortic valve regurgitation is not visualized. No evidence of aortic valve sclerosis or stenosis.  8. The pulmonic valve was not well visualized. Pulmonic valve regurgitation is not visualized.  9. TR signal is inadequate for assessing pulmonary artery systolic pressure. 10. The inferior vena cava is dilated in size with <50% respiratory variability, suggesting right atrial pressure of 15 mmHg. FINDINGS  Left Ventricle:  Left ventricular ejection fraction, by visual estimation, is 40 to 45%. The left ventricle has mild to moderately decreased function. The left ventricle demonstrates global hypokinesis. There is mildly increased left ventricular hypertrophy. Right Ventricle: The right ventricular size is normal. No increase in right ventricular wall thickness. Global RV systolic function is has normal systolic function. Left Atrium: Left atrial size was mildly dilated. Right Atrium: Right atrial size was normal in size Pericardium: There is no evidence of pericardial effusion. Mitral Valve: The mitral valve is normal in structure. No evidence of mitral valve regurgitation. MV peak gradient, 5.9 mmHg. Tricuspid Valve: The tricuspid valve is normal in structure. Tricuspid valve regurgitation is not demonstrated. Aortic Valve: The aortic valve is normal in structure. Aortic valve regurgitation is not visualized. The aortic valve is structurally normal, with no evidence of sclerosis or stenosis. Aortic valve mean gradient measures 3.0 mmHg. Aortic valve peak gradient measures 5.1 mmHg. Aortic valve area, by VTI measures 2.53 cm. Pulmonic Valve: The pulmonic valve was not well visualized. Pulmonic valve regurgitation is not visualized. Pulmonic regurgitation is not visualized. Aorta: The aortic root and ascending aorta are structurally normal, with no evidence of dilitation. Venous: The inferior vena cava is dilated in size with less than 50% respiratory variability, suggesting right atrial pressure of 15 mmHg. IAS/Shunts: The atrial septum is grossly normal.  LEFT  VENTRICLE PLAX 2D LVIDd:         4.74 cm  Diastology LVIDs:         4.17 cm  LV e' lateral:   9.90 cm/s LV PW:         1.06 cm  LV E/e' lateral: 9.8 LV IVS:        1.08 cm  LV e' medial:    8.05 cm/s LVOT diam:     2.20 cm  LV E/e' medial:  12.0 LV SV:         27 ml LV SV Index:   13.29 LVOT Area:     3.80 cm  RIGHT VENTRICLE             IVC RV S prime:     12.80 cm/s  IVC  diam: 2.47 cm TAPSE (M-mode): 2.9 cm LEFT ATRIUM           Index       RIGHT ATRIUM           Index LA diam:      3.60 cm 1.74 cm/m  RA Area:     13.90 cm LA Vol (A2C): 48.8 ml 23.54 ml/m RA Volume:   32.90 ml  15.87 ml/m LA Vol (A4C): 70.8 ml 34.15 ml/m  AORTIC VALVE AV Area (Vmax):    2.63 cm AV Area (Vmean):   2.32 cm AV Area (VTI):     2.53 cm AV Vmax:           113.00 cm/s AV Vmean:          79.800 cm/s AV VTI:            0.221 m AV Peak Grad:      5.1 mmHg AV Mean Grad:      3.0 mmHg LVOT Vmax:         78.30 cm/s LVOT Vmean:        48.700 cm/s LVOT VTI:          0.147 m LVOT/AV VTI ratio: 0.67  AORTA Ao Root diam: 3.00 cm Ao Asc diam:  3.40 cm MITRAL VALVE MV Area (PHT): 6.27 cm             SHUNTS MV Peak grad:  5.9 mmHg             Systemic VTI:  0.15 m MV Mean grad:  2.0 mmHg             Systemic Diam: 2.20 cm MV Vmax:       1.21 m/s MV Vmean:      61.8 cm/s MV VTI:        0.31 m MV PHT:        35.09 msec MV Decel Time: 121 msec MV E velocity: 96.80 cm/s 103 cm/s MV A velocity: 49.20 cm/s 70.3 cm/s MV E/A ratio:  1.97       1.5  Oswaldo Milian MD Electronically signed by Oswaldo Milian MD Signature Date/Time: 08/02/2019/8:03:44 PM    Final    US Abdomen Limited RUQ  Result Date: 08/02/2019 CLINICAL DATA:  Increasing abdominal pain EXAM: ULTRASOUND ABDOMEN LIMITED RIGHT UPPER QUADRANT COMPARISON:  CT from 07/30/2019 FINDINGS: Gallbladder: Gallbladder is significantly distended with mild wall thickening. No cholelithiasis or pericholecystic fluid is seen. Negative sonographic Murphy's sign is noted. Common bile duct: Diameter: 6 mm. Liver: No focal lesion identified. Within normal limits in parenchymal echogenicity. Portal vein is patent on color Doppler imaging with normal direction of  blood flow towards the liver. Mild biliary ductal dilatation is seen consistent with the changes of chronic pancreatitis seen on prior CT. The overall appearance is stable. Other: None. IMPRESSION:  Distended gallbladder and mildly the dilated intrahepatic biliary ductal system. These changes are likely related to chronic changes in the head of the pancreas from previous pancreatitis. The overall appearance is similar to that seen on the prior exam. Electronically Signed   By: Inez Catalina M.D.   On: 08/02/2019 21:56   DG C-ARM BRONCHOSCOPY  Result Date: 08/05/2019 C-ARM BRONCHOSCOPY: Fluoroscopy was utilized by the requesting physician.  No radiographic interpretation.    ASSESSMENT: This is a very pleasant 69 years old white male recently diagnosed with stage IIIC (T4, N2, M0) non-small cell lung cancer, squamous cell carcinoma with sarcomatoid features diagnosed in January 2021.  He presented with a large left upper lobe lung mass with chest wall invasion and rib destruction as well as mediastinal lymphadenopathy.   PLAN: I had a lengthy discussion with the patient and his daughter today about his current disease stage, prognosis and treatment options.  I personally and independently reviewed the scan images and discussed the results with the patient and his daughter. I recommended for him a course of concurrent chemoradiation with weekly carboplatin for AUC of 2 and paclitaxel 45 mg/M2 for 6-7 weeks followed by consolidation immunotherapy if he has no evidence for disease progression. I discussed with the patient and his daughter the adverse effect of this treatment including but not limited to alopecia, myelosuppression, nausea and vomiting, peripheral neuropathy, liver or renal dysfunction.  The patient was also seen by radiation oncology earlier today. After the lengthy discussion the patient and his daughter decided against any treatment and they would like to consider palliative care and hospice. They will contact his primary care physician Dr. Garlan Fillers for the referral to the hospice service. If the patient changes his mind regarding treatment will be happy to discuss it with him  further. I discussed with the patient his prognosis with and without treatment and unfortunately has very poor prognosis and will have significant chest wall invasion and painful consequences without treatment. He will continue his current pain medication. For smoking cessation, I strongly encouraged the patient to quit smoking. For the hypertension, I offered the patient IV fluid but he declined.  The patient voices understanding of current disease status and treatment options and is in agreement with the current care plan.  All questions were answered. The patient knows to call the clinic with any problems, questions or concerns. We can certainly see the patient much sooner if necessary.  Thank you so much for allowing me to participate in the care of Timothy Martinez. I will continue to follow up the patient with you and assist in his care.  The total time spent in the appointment was 80 minutes.  Disclaimer: This note was dictated with voice recognition software. Similar sounding words can inadvertently be transcribed and may not be corrected upon review.   Eilleen Kempf August 15, 2019, 2:17 PM

## 2019-08-15 NOTE — Patient Instructions (Signed)
Smoking cessation  

## 2019-08-15 NOTE — Telephone Encounter (Signed)
Timothy Martinez calls because pt is being released from [facility] with PT orders.  He wants to know if we are willing to sign the the [facility] PT orders.  Please call him back @ [place], CMA

## 2019-08-15 NOTE — Progress Notes (Signed)
The proposed treatment discussed in cancer conference 08/15/19 is for discussion purpose only and is not a binding recommendation.  The patient was not examined nor present for their treatment options.  Therefore, final plans cannot be decided.

## 2019-08-15 NOTE — Progress Notes (Signed)
Reviewed and agree with assessment/plan.   Hortensia Duffin, MD [facility] 07/20/2016, 12:24 PM Pager:  [phone]  

## 2019-08-15 NOTE — Progress Notes (Signed)
Radiation Oncology         [phone] ________________________________  Name: Timothy Martinez        MRN: [id number]  Date of Service: 08/15/2019 DOB: May 13, 1951  GM:WNUUVOZ, Christia Reading, DO  [name], *     REFERRING PHYSICIAN: [name], *   DIAGNOSIS: Diagnoses of Malignant neoplasm of upper lobe of left lung ([place]) and Malignant neoplasm of unspecified part of unspecified bronchus or lung ([place]) were pertinent to this visit.   HISTORY OF PRESENT ILLNESS: Timothy Martinez is a 69 y.o. male seen at the request of Dr. Roxan Hockey for a newly diagnosed lung cancer.  The patient apparently had a normal lung cancer screening CT scan in 2019.  More recently he presented with acute shortness of breath to the emergency department. A CT scan on 07/29/2018 of the CAP revealed a mass in the LUL measuring approximately 11.8 x 8.2 cm in greatest dimension with extension to the left hilum and associated concerns for involvement of the posterior lateral aspect of the left third rib consistent with local chest wall invasion, there also appears to be soft tissue density between the third and fourth ribs laterally also consistent with invasive change of the chest wall, emphysematous changes were also noted, a 7 mm nodule was also seen in the right upper lobe which was stable from a prior study in 2019.  A hypodense lesion in the left lobe of the thyroid measuring 14 mm was noted, scattered small mediastinal nodes were seen as well as bilateral adenopathy in the hilum.  Chronic changes were also seen within the pancreas consistent with a known history of alcoholic pancreatitis, as well as by pancreatic ductal dilatation and biliary dilatation.  No other evidence of metastatic disease was seen.  A bronchoscopy was performed but insufficient tissue for diagnosis was seen, he did undergo a CT-guided biopsy of the primary lesion on 08/08/2018 revealing a squamous cell carcinoma with sarcomatoid component.   He is seen today in the multidisciplinary thoracic oncology clinic, a PET scan and MRI have been ordered but are pending.    PREVIOUS RADIATION THERAPY: No   PAST MEDICAL HISTORY:  Past Medical History:  Diagnosis Date  . Acute on chronic pancreatitis (Timothy Martinez) 05/17/2016  . Alcohol abuse   . Alcoholic pancreatitis   . Anginal pain (Timothy Martinez)   . Anxiety and depression 01/20/2015   PHQ 9 = 11 (01/20/15)    . Arthritis    "eat up w/it" (05/17/2016)  . Atrial fibrillation status post cardioversion (Timothy Martinez) 08/2014  . Atrial flutter, unspecified   . CHF (congestive heart failure) (Timothy Martinez)   . Chronic anticoagulation   . Chronic atrial fibrillation ([place])   . Chronic bronchitis ([place])   . Chronic left-sided low back pain with sciatica 12/21/2012  . Chronic pancreatitis (Timothy Martinez)   . COLD (chronic obstructive lung disease) ([place])   . Complication of anesthesia    pt C/O confusion after procedure in April 2020  . Daily headache    "need eye examined" (05/17/2016)  . Gastric outlet obstruction 10/2018  . GERD (gastroesophageal reflux disease)   . Hematuria, microscopic 02/13/2015   Noted on UA - Recheck UA at next visit ~ 1 months   . Hyperlipidemia   . Hypertension   . Insomnia 02/11/2016  . Lateral epicondylitis of right elbow 03/17/2017  . Left foot pain 10/12/2013  . Loss of weight 02/19/2018  . Low back pain 12/21/2012  . Malnutrition of moderate degree 05/18/2016  .  Neck pain, bilateral posterior 10/15/2014  . Neuropathy 03/16/2015  . Pain of joint of left ankle and foot 06/13/2018  . Pain of right upper extremity 05/30/2016  . Pancreatic pseudocyst    seen on CT scan 07/2014  . Pancreatitis 08/04/2014  . Permanent atrial fibrillation ([place]) 05/05/2017  . Pneumonia    "?a few times" (05/17/2016)  . 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 Tobacco abuse 05/30/2011   Heavy smoker up to 3 ppd down to 15 cigs per day in 09/2014   . Type II diabetes mellitus (Timothy Martinez)   . Wears dentures   . Wears glasses        PAST  SURGICAL HISTORY: Past Surgical History:  Procedure Laterality Date  . BACK SURGERY    . BIOPSY  11/20/2018   Procedure: BIOPSY;  Surgeon: Rush Landmark Telford Nab., MD;  Location: [facility];  Service: Gastroenterology;;  . BIOPSY  02/04/2019   Procedure: BIOPSY;  Surgeon: Irving Copas., MD;  Location: [facility];  Service: Gastroenterology;;  . CARDIOVERSION N/A 09/22/2014   Procedure: CARDIOVERSION;  Surgeon: Thayer Headings, MD;  Location: [facility];  Service: Cardiovascular;  Laterality: N/A;  . COLONOSCOPY W/ POLYPECTOMY  2019  . ERCP N/A 02/04/2019   Procedure: ENDOSCOPIC RETROGRADE CHOLANGIOPANCREATOGRAPHY (ERCP);  Surgeon: Irving Copas., MD;  Location: [facility];  Service: Gastroenterology;  Laterality: N/A;  . ESOPHAGOGASTRODUODENOSCOPY (EGD) WITH PROPOFOL N/A 11/20/2018   Procedure: ESOPHAGOGASTRODUODENOSCOPY (EGD) WITH PROPOFOL;  Surgeon: Rush Landmark Telford Nab., MD;  Location: [facility];  Service: Gastroenterology;  Laterality: N/A;  . ESOPHAGOGASTRODUODENOSCOPY (EGD) WITH PROPOFOL N/A 02/04/2019   Procedure: ESOPHAGOGASTRODUODENOSCOPY (EGD) WITH PROPOFOL;  Surgeon: Rush Landmark Telford Nab., MD;  Location: [facility];  Service: Gastroenterology;  Laterality: N/A;  . FOREIGN BODY REMOVAL  11/20/2018   Procedure: FOREIGN BODY REMOVAL;  Surgeon: Rush Landmark Telford Nab., MD;  Location: [facility];  Service: Gastroenterology;;  . [place]   not cervical, states had lesion on neck which was removed  . POSTERIOR LAMINECTOMY / DECOMPRESSION LUMBAR SPINE  10/2006   of L4, L5 and S1 with a 5-1 diskectomy, microdissection with the microscope/notes 10/24/2006  . SPLENECTOMY  2003  . UPPER ESOPHAGEAL ENDOSCOPIC ULTRASOUND (EUS) N/A 11/20/2018   Procedure: UPPER ESOPHAGEAL ENDOSCOPIC ULTRASOUND (EUS);  Surgeon: Irving Copas., MD;  Location: [facility];  Service: Gastroenterology;  Laterality: N/A;  . UPPER ESOPHAGEAL ENDOSCOPIC ULTRASOUND (EUS)  N/A 02/04/2019   Procedure: UPPER ESOPHAGEAL ENDOSCOPIC ULTRASOUND (EUS);  Surgeon: Irving Copas., MD;  Location: [facility];  Service: Gastroenterology;  Laterality: N/A;  . VIDEO BRONCHOSCOPY WITH ENDOBRONCHIAL ULTRASOUND N/A 08/05/2019   Procedure: Video Bronchoscopy With Endobronchial Ultrasound;  Surgeon: [name], MD;  Location: [facility];  Service: Thoracic;  Laterality: N/A;  . VIDEO BRONCHOSCOPY WITH RADIAL ENDOBRONCHIAL ULTRASOUND  08/05/2019   Procedure: Video Bronchoscopy With Radial Endobronchial Ultrasound;  Surgeon: [name], MD;  Location[facility];  Service: Thoracic;;     FAMILY HISTORY:  Family History  Problem Relation Age of [place]  . Diabetes Mother   . Aneurysm Mother   . Stroke Mother   . Hypertension Mother   . Alcohol abuse Father   . Cancer Father        Lung  . Cancer Sister        Lung Cancer  . Post-traumatic stress disorder Brother   . Heart attack Neg Hx   . Colon cancer Neg Hx   . Liver cancer Neg Hx   . Rectal cancer  Neg Hx   . Esophageal cancer Neg Hx   . Stomach cancer Neg Hx   . Inflammatory bowel disease Neg Hx      SOCIAL HISTORY:  reports that he has been smoking cigarettes. He started smoking about 55 years ago. He has a 26.00 pack-year smoking history. He has never used smokeless tobacco. He reports previous alcohol use. He reports that he does not use drugs. The patient is widowed and lives in [place]. He is accompanied by his daughter [name].   ALLERGIES: Prozac [fluoxetine hcl], Morphine and related, and Penicillins   MEDICATIONS:  Current Outpatient Medications  Medication Sig Dispense Refill  . albuterol (VENTOLIN HFA) 108 (90 Base) MCG/ACT inhaler Inhale 2 puffs into the lungs every 6 (six) hours as needed for wheezing or shortness of breath. 8 g 3  . amiodarone (PACERONE) 200 MG tablet Take 2 tablets (400 mg total) by mouth 2 (two) times daily for 1 day, THEN 1 tablet (200 mg total) daily for  28 days. 32 tablet 0  . amitriptyline (ELAVIL) 10 MG tablet TAKE 1 TABLET BY MOUTH EVERYDAY AT BEDTIME (Patient taking differently: Take 10 mg by mouth at bedtime. ) 90 tablet 1  . atorvastatin (LIPITOR) 40 MG tablet Take 0.5 tablets (20 mg total) by mouth daily at 6 PM. (Patient taking differently: Take 40 mg by mouth daily at 6 PM. ) 90 tablet 3  . Blood Glucose Monitoring Suppl (ONE TOUCH ULTRA 2) w/Device KIT Dispense one glucometer. Diagnosis Type 2 Diabetes. E11.9 1 each 0  . famotidine (PEPCID) 20 MG tablet TAKE 1 TABLET BY MOUTH TWICE A DAY (Patient taking differently: Take 20 mg by mouth every morning. ) 30 tablet 3  . feeding supplement, ENSURE ENLIVE, (ENSURE ENLIVE) LIQD Take 237 mLs by mouth 3 (three) times daily between meals. 237 mL 12  . gabapentin (NEURONTIN) 100 MG capsule TAKE 1 CAPSULE BY MOUTH THREE TIMES A DAY (Patient taking differently: Take 100 mg by mouth 2 (two) times daily. ) 270 capsule 2  . glucose blood (ONE TOUCH ULTRA TEST) test strip Use check fasting blood glucose qAM. Type 2 Diabetes E11.9 100 each 3  . guaiFENesin (MUCINEX) 600 MG 12 hr tablet Take 1 tablet (600 mg total) by mouth 2 (two) times daily. 30 tablet 1  . Lancets (FREESTYLE) lancets Use as instructed 100 each 12  . Lancets (ONETOUCH ULTRASOFT) lancets Use as instructed 100 each 12  . lipase/protease/amylase (CREON) 36000 UNITS CPEP capsule Take 36,000-72,000 capsules by mouth See admin instructions. 2 caps with each meal, 1 capsule with snacks    . metFORMIN (GLUCOPHAGE) 1000 MG tablet Take 1 tablet (1,000 mg total) by mouth 2 (two) times daily with a meal. 180 tablet 3  . metoprolol tartrate (LOPRESSOR) 50 MG tablet Take 1.5 tablets (75 mg total) by mouth 2 (two) times daily. 270 tablet 3  . mometasone-formoterol (DULERA) 200-5 MCG/ACT AERO Inhale 2 puffs into the lungs 2 (two) times daily. 3 Inhaler 1  . Multiple Vitamin (MULTIVITAMIN WITH MINERALS) TABS tablet Take 1 tablet by mouth daily.    .  naproxen sodium (ALEVE) 220 MG tablet Take 880 mg by mouth 2 (two) times daily as needed (pain).    . nitroGLYCERIN (NITROSTAT) 0.4 MG SL tablet Place 1 tablet (0.4 mg total) under the tongue every 5 (five) minutes as needed for chest pain. 100 tablet 3  . ondansetron (ZOFRAN ODT) 4 MG disintegrating tablet Take 1 tablet (4 mg total) by mouth  every 8 (eight) hours as needed for nausea or vomiting. 10 tablet 0  . oxyCODONE (OXY IR/ROXICODONE) 5 MG immediate release tablet Take 1 tablet (5 mg total) by mouth every 6 (six) hours as needed for severe pain. 30 tablet 0  . pantoprazole (PROTONIX) 40 MG tablet Take 1 tablet (40 mg total) by mouth daily. 90 tablet 3  . polyethylene glycol (MIRALAX / GLYCOLAX) 17 g packet TAKE 17 G BY MOUTH DAILY. (Patient taking differently: Take 17 g by mouth daily as needed for moderate constipation. ) 14 packet 0  . prochlorperazine (COMPAZINE) 10 MG tablet Take 1 tablet (10 mg total) by mouth every 8 (eight) hours as needed for nausea or vomiting. 30 tablet 2  . tamsulosin (FLOMAX) 0.4 MG CAPS capsule Take 1 capsule (0.4 mg total) by mouth daily. 30 capsule 3  . Tiotropium Bromide Monohydrate (SPIRIVA RESPIMAT) 2.5 MCG/ACT AERS Inhale 2 puffs into the lungs 2 (two) times daily. 4 g 0  . XARELTO 20 MG TABS tablet TAKE 1 TABLET BY MOUTH DAILY WITH SUPPER (Patient taking differently: Take 20 mg by mouth daily with supper. ) 90 tablet 1   No current facility-administered medications for this visit.     REVIEW OF SYSTEMS: On review of systems, the patient reports that he is so tired and is tired of struggling. He's lost over 100 pounds unintentionally in the last year, and is struggling to get relief with breathing despite 4 L O2. He describes chest wall pain in the left chest and a dry cough sometimes that is can be productive. He is not having hemoptysis. At times he wakes up confused but denies headaches, changes in speech, vision, or unintentional movements. He denies  fevers, chills, night sweats. He denies bowel or bladder disturbances, and denies abdominal pain, nausea or vomiting. He denies any new musculoskeletal or joint aches or pains. A complete review of systems is obtained and is otherwise negative.     PHYSICAL EXAM:  Wt Readings from Last 3 Encounters:  08/14/19 148 lb 6.4 oz (67.3 kg)  08/09/19 149 lb 7.6 oz (67.8 kg)  07/15/19 159 lb (72.1 kg)   Temp Readings from Last 3 Encounters:  08/14/19 98.2 F (36.8 C) (Temporal)  08/09/19 97.6 F (36.4 C) (Oral)  06/28/19 97.8 F (36.6 C) (Oral)   BP Readings from Last 3 Encounters:  08/14/19 (!) 88/52  08/09/19 126/66  07/12/19 124/72   Pulse Readings from Last 3 Encounters:  08/14/19 82  08/09/19 87  07/05/19 84   In general this is a chronically ill appearing Caucasian male in no acute distress.  He's alert and oriented x4 and appropriate throughout the examination. Cardiopulmonary assessment is negative for acute distress and he exhibits normal effort.   ECOG = 2  0 - Asymptomatic (Fully active, able to carry on all predisease activities without restriction)  1 - Symptomatic but completely ambulatory (Restricted in physically strenuous activity but ambulatory and able to carry out work of a light or sedentary nature. For example, light housework, office work)  2 - Symptomatic, <50% in bed during the day (Ambulatory and capable of all self care but unable to carry out any work activities. Up and about more than 50% of waking hours)  3 - Symptomatic, >50% in bed, but not bedbound (Capable of only limited self-care, confined to bed or chair 50% or more of waking hours)  4 - Bedbound (Completely disabled. Cannot carry on any self-care. Totally confined to bed or chair)  5 - Death   Eustace Pen MM, Creech RH, Tormey DC, et al. [phone]). "Toxicity and response criteria of the [facility] Group". [place] Oncol. 5 (6): 649-55    LABORATORY DATA:  Lab Results   Component Value Date   WBC 27.2 (H) 08/09/2019   HGB 9.3 (L) 08/09/2019   HCT 28.5 (L) 08/09/2019   MCV 84.8 08/09/2019   PLT 449 (H) 08/09/2019   Lab Results  Component Value Date   NA 139 08/09/2019   K 3.6 08/09/2019   CL 101 08/09/2019   CO2 28 08/09/2019   Lab Results  Component Value Date   ALT 18 08/09/2019   AST 21 08/09/2019   ALKPHOS 157 (H) 08/09/2019   BILITOT 1.0 08/09/2019      RADIOGRAPHY: DG Chest 2 View  Result Date: 08/03/2019 CLINICAL DATA:  Significant atrial fibrillation. Alcoholic pancreatitis. EXAM: CHEST - 2 VIEW COMPARISON:  August 01, 2019 FINDINGS: The mass in the left upper lobe with destruction of the left posterior third rib is similar in the interval. Bilateral pulmonary opacities are diffuse and similar in the interval. No other interval changes. IMPRESSION: 1. The left upper lobe mass with destruction of the posterior left third rib is stable. 2. Diffuse bilateral pulmonary infiltrates are similar in the interval. Electronically Signed   By: Dorise Bullion III M.D   On: 08/03/2019 10:16   CT Angio Chest PE W and/or Wo Contrast  Result Date: 07/30/2019 CLINICAL DATA:  Shortness of breath and near syncopal episode, mass lesion on recent chest x-ray EXAM: CT ANGIOGRAPHY CHEST CT ABDOMEN AND PELVIS WITH CONTRAST TECHNIQUE: Multidetector CT imaging of the chest was performed using the standard protocol during bolus administration of intravenous contrast. Multiplanar CT image reconstructions and MIPs were obtained to evaluate the vascular anatomy. Multidetector CT imaging of the abdomen and pelvis was performed using the standard protocol during bolus administration of intravenous contrast. CONTRAST:  150m OMNIPAQUE IOHEXOL 350 MG/ML SOLN COMPARISON:  Prior chest x-ray from earlier in the same day CT from 03/30/2018. FINDINGS: CTA CHEST FINDINGS Cardiovascular: Thoracic aorta demonstrates atherosclerotic calcifications without aneurysmal dilatation. No  cardiac enlargement is noted. Mild coronary calcifications are noted. The pulmonary artery is well visualized within normal enhancement pattern. No filling defect to suggest pulmonary embolism is seen. Mediastinum/Nodes: Thoracic inlet shows a hypodensity within the left lobe of the thyroid. This measures approximately 14 mm in greatest dimension. The esophagus as visualized is within normal limits. Scattered small mediastinal lymph nodes are noted primarily within the AP window. Small hilar adenopathy is noted bilaterally. Lungs/Pleura: In the left upper lobe there is a focal soft tissue mass lesion which measures approximately 11.8 x 8.2 cm in greatest AP and transverse dimensions respectively. It has some extension to the left hilum and there are associated changes involving the posterolateral aspect of the left third rib consistent with localized invasion and metastatic disease. There also appears to be soft tissue density between the third and fourth ribs laterally consistent with chest wall invasion. Emphysematous changes are noted with evidence of subpleural fibrosis similar to that seen on prior chest x-rays. 7 mm nodule is noted in the right upper lobe best seen on image number 76 of series 4. This is stable from a prior CT examination. Musculoskeletal: As described above there are destructive changes of the posterolateral aspect of the left third rib consistent with localized neoplastic invasion. No other specific rib abnormality is noted. The thoracic spine demonstrates degenerative change without  lytic lesion or compression deformity. Review of the MIP images confirms the above findings. CT ABDOMEN and PELVIS FINDINGS Hepatobiliary: Gallbladder is well distended. Mild biliary ductal dilatation is seen secondary to changes of chronic pancreatitis in the head of the pancreas. These changes are similar to that seen on prior abdominal CT. The liver demonstrates some fatty infiltration. No definitive mass is  seen. Pancreas: Chronic changes are noted in the pancreas with pancreatic ductal dilatation stable from the prior exam. Multiple calcifications are seen in the head and uncinate process consistent with prior chronic pancreatitis. Previously seen pseudocyst in the head of the pancreas is not as well visualized on today's exam. Spleen: Surgically removed Adrenals/Urinary Tract: Adrenal glands are within normal limits. The kidneys demonstrate a normal enhancement pattern bilaterally. No definitive calculi are seen. Delayed images demonstrate dense nephrograms without significant contrast enhancement. This is of uncertain significance but may be related to incomplete delayed images. No elevated creatinine is seen. The bladder is partially distended. Stomach/Bowel: No obstructive or inflammatory changes of the colon are seen. The appendix is not well visualized. No obstructive changes of the small bowel are noted. Stomach is within normal limits. Vascular/Lymphatic: Aortic atherosclerosis. No enlarged abdominal or pelvic lymph nodes. Reproductive: Prostate is unremarkable. Other: No abdominal wall hernia or abnormality. No abdominopelvic ascites. Musculoskeletal: Degenerative changes of lumbar spine are noted. No metastatic lesions are seen. Review of the MIP images confirms the above findings. IMPRESSION: Large left upper lobe mass lesion with apparent chest wall invasion in rib destruction as described consistent with a primary pulmonary neoplasm till proven otherwise. Associated mediastinal and hilar adenopathy is seen. Stable nodule in the right upper lobe unchanged from 2019. No evidence of pulmonary emboli. Hypodense lesion within the left lobe of the thyroid. No followup recommended (ref: J Am Coll Radiol. 2015 Feb;12(2): 143-50). Chronic changes in the pancreas consistent with chronic pancreatitis. Pancreatic ductal dilatation in biliary ductal dilatation is seen. Chronic changes in the abdomen and pelvis stable  from previous exams. Electronically Signed   By: Inez Catalina M.D.   On: 07/30/2019 20:17   CT ABDOMEN PELVIS W CONTRAST  Result Date: 07/30/2019 CLINICAL DATA:  Shortness of breath and near syncopal episode, mass lesion on recent chest x-ray EXAM: CT ANGIOGRAPHY CHEST CT ABDOMEN AND PELVIS WITH CONTRAST TECHNIQUE: Multidetector CT imaging of the chest was performed using the standard protocol during bolus administration of intravenous contrast. Multiplanar CT image reconstructions and MIPs were obtained to evaluate the vascular anatomy. Multidetector CT imaging of the abdomen and pelvis was performed using the standard protocol during bolus administration of intravenous contrast. CONTRAST:  135m OMNIPAQUE IOHEXOL 350 MG/ML SOLN COMPARISON:  Prior chest x-ray from earlier in the same day CT from 03/30/2018. FINDINGS: CTA CHEST FINDINGS Cardiovascular: Thoracic aorta demonstrates atherosclerotic calcifications without aneurysmal dilatation. No cardiac enlargement is noted. Mild coronary calcifications are noted. The pulmonary artery is well visualized within normal enhancement pattern. No filling defect to suggest pulmonary embolism is seen. Mediastinum/Nodes: Thoracic inlet shows a hypodensity within the left lobe of the thyroid. This measures approximately 14 mm in greatest dimension. The esophagus as visualized is within normal limits. Scattered small mediastinal lymph nodes are noted primarily within the AP window. Small hilar adenopathy is noted bilaterally. Lungs/Pleura: In the left upper lobe there is a focal soft tissue mass lesion which measures approximately 11.8 x 8.2 cm in greatest AP and transverse dimensions respectively. It has some extension to the left hilum and there are associated changes  involving the posterolateral aspect of the left third rib consistent with localized invasion and metastatic disease. There also appears to be soft tissue density between the third and fourth ribs laterally  consistent with chest wall invasion. Emphysematous changes are noted with evidence of subpleural fibrosis similar to that seen on prior chest x-rays. 7 mm nodule is noted in the right upper lobe best seen on image number 76 of series 4. This is stable from a prior CT examination. Musculoskeletal: As described above there are destructive changes of the posterolateral aspect of the left third rib consistent with localized neoplastic invasion. No other specific rib abnormality is noted. The thoracic spine demonstrates degenerative change without lytic lesion or compression deformity. Review of the MIP images confirms the above findings. CT ABDOMEN and PELVIS FINDINGS Hepatobiliary: Gallbladder is well distended. Mild biliary ductal dilatation is seen secondary to changes of chronic pancreatitis in the head of the pancreas. These changes are similar to that seen on prior abdominal CT. The liver demonstrates some fatty infiltration. No definitive mass is seen. Pancreas: Chronic changes are noted in the pancreas with pancreatic ductal dilatation stable from the prior exam. Multiple calcifications are seen in the head and uncinate process consistent with prior chronic pancreatitis. Previously seen pseudocyst in the head of the pancreas is not as well visualized on today's exam. Spleen: Surgically removed Adrenals/Urinary Tract: Adrenal glands are within normal limits. The kidneys demonstrate a normal enhancement pattern bilaterally. No definitive calculi are seen. Delayed images demonstrate dense nephrograms without significant contrast enhancement. This is of uncertain significance but may be related to incomplete delayed images. No elevated creatinine is seen. The bladder is partially distended. Stomach/Bowel: No obstructive or inflammatory changes of the colon are seen. The appendix is not well visualized. No obstructive changes of the small bowel are noted. Stomach is within normal limits. Vascular/Lymphatic: Aortic  atherosclerosis. No enlarged abdominal or pelvic lymph nodes. Reproductive: Prostate is unremarkable. Other: No abdominal wall hernia or abnormality. No abdominopelvic ascites. Musculoskeletal: Degenerative changes of lumbar spine are noted. No metastatic lesions are seen. Review of the MIP images confirms the above findings. IMPRESSION: Large left upper lobe mass lesion with apparent chest wall invasion in rib destruction as described consistent with a primary pulmonary neoplasm till proven otherwise. Associated mediastinal and hilar adenopathy is seen. Stable nodule in the right upper lobe unchanged from 2019. No evidence of pulmonary emboli. Hypodense lesion within the left lobe of the thyroid. No followup recommended (ref: J Am Coll Radiol. 2015 Feb;12(2): 143-50). Chronic changes in the pancreas consistent with chronic pancreatitis. Pancreatic ductal dilatation in biliary ductal dilatation is seen. Chronic changes in the abdomen and pelvis stable from previous exams. Electronically Signed   By: Inez Catalina M.D.   On: 07/30/2019 20:17   CT BIOPSY  Result Date: 08/08/2019 INDICATION: Large left upper lobe mass with chest wall and left rib invasion EXAM: CT-GUIDED BIOPSY LEFT RIB/CHEST WALL INVASIVE MASS MEDICATIONS: 1% lidocaine local ANESTHESIA/SEDATION: 1.0 mg IV Versed; 50 mcg IV Fentanyl Moderate Sedation Time:  15 minutes The patient was continuously monitored during the procedure by the interventional radiology nurse under my direct supervision. PROCEDURE: The procedure, risks, benefits, and alternatives were explained to the patient. Questions regarding the procedure were encouraged and answered. The patient understands and consents to the procedure. Previous imaging reviewed. Patient position prone. Noncontrast localization CT performed. The left upper lobe mass is locally invasive at the third rib and chest wall. This air was localized and marked. Under  sterile conditions and local anesthesia, a 17  gauge 0.8 cm access needle was advanced from a posterior oblique approach into the left rib/chest wall destructive invasive component. Needle position confirmed with CT. 18 gauge core biopsies obtained. Samples were intact and non fragmented. These were placed in formalin. Patient tolerated the procedure well without complication. Vital sign monitoring by nursing staff during the procedure will continue as patient is in the special procedures unit for post procedure observation. FINDINGS: The images document guide needle placement within the left third rib/chest wall invasive destructive mass. Post biopsy images demonstrate no hemorrhage, hematoma, effusion or pneumothorax. COMPLICATIONS: None immediate. IMPRESSION: Successful CT-guided core biopsy of the left third rib/chest wall invasive destructive mass Electronically Signed   By: Jerilynn Mages.  Shick M.D.   On: 08/08/2019 12:01   DG CHEST PORT 1 VIEW  Result Date: 08/01/2019 CLINICAL DATA:  69 year old male with hypoxia. Oxygen desaturation. Shortness of breath. EXAM: PORTABLE CHEST 1 VIEW COMPARISON:  Chest x-ray 07/30/2019.  Chest CTA 07/30/2019. FINDINGS: Previously noted left upper lobe mass appears larger on today's examination, which could represent growth of the lesion, or may reflect surrounding postobstructive changes. This lesion is again invasive into the left chest wall with gross destruction of the posterolateral aspect of the left third rib. Enlarging small left pleural effusion which appears partially loculated in the periphery of the left base. Increasing areas of ill-defined opacity and interstitial prominence throughout the lungs bilaterally (left greater than right), likely reflects evolving multilobar pneumonia. Pulmonary vasculature is partially obscured, but there is no evidence to suggest frank pulmonary edema. Heart size is normal. Aortic atherosclerosis. IMPRESSION: 1. Interval worsening of aeration throughout the lungs bilaterally, concerning  for developing multilobar pneumonia. 2. Enlargement of mass in left upper lobe which may reflect progression of the underlying neoplasm but also likely represents worsening adjacent postobstructive changes. Associated with this there is an enlarging small left partially loculated pleural effusion. Electronically Signed   By: Vinnie Langton M.D.   On: 08/01/2019 08:12   DG Chest Portable 1 View  Result Date: 07/30/2019 CLINICAL DATA:  Hypotension. EXAM: PORTABLE CHEST 1 VIEW COMPARISON:  December 29, 2018 FINDINGS: Mild, chronic appearing increased interstitial lung markings are noted. A 10.7 cm x 8.7 cm masslike opacity is seen within the left upper lobe. This is increased in severity when compared to the prior study. There is apparent cortical destruction of the adjacent portion of the third left rib. This represents a new finding compared to the prior exam. There is no evidence of a pleural effusion or pneumothorax. The heart size and mediastinal contours are within normal limits. IMPRESSION: 1. Large opacity within the left upper lobe suspicious for the presence of a large lung mass. The presence of adjacent cortical destruction of the third left rib is suggestive of malignant properties. Correlation with chest CT is recommended. Electronically Signed   By: Virgina Norfolk M.D.   On: 07/30/2019 17:57   ECHOCARDIOGRAM COMPLETE  Result Date: 08/02/2019   ECHOCARDIOGRAM REPORT   Patient Name:   EBB CARELOCK Date of Exam: 08/02/2019 Medical Rec #:  [id number]       Height:       75.0 in Accession #:    [id number]      Weight:       175.0 lb Date of Birth:  [birth year]        BSA:          2.07 m Patient Age:    57 years  BP:           125/73 mmHg Patient Gender: M               HR:           76 bpm. Exam Location:  Inpatient Procedure: 2D Echo Indications:    Dyspnea 786.09/R06.00  History:        Patient has prior history of Echocardiogram examinations, most                 recent 12/30/2014. COPD,  Arrythmias:Atrial Fibrillation; Risk                 Factors:Diabetes. 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  Sonographer:    Clayton Lefort RDCS (AE) Referring Phys: [place]  1. Technically diffcult study. Left ventricular ejection fraction, by visual estimation, is grossly 40 to 45%. The left ventricle has mild to moderately decreased function. There is mildly increased left ventricular hypertrophy. Would consider limited echo with contrast to better evaluate systolic function  2. Global right ventricle has normal systolic function.The right ventricular size is normal. No increase in right ventricular wall thickness.  3. Left atrial size was mildly dilated.  4. Right atrial size was normal.  5. The mitral valve is normal in structure. No evidence of mitral valve regurgitation.  6. The tricuspid valve is normal in structure.  7. The aortic valve is normal in structure. Aortic valve regurgitation is not visualized. No evidence of aortic valve sclerosis or stenosis.  8. The pulmonic valve was not well visualized. Pulmonic valve regurgitation is not visualized.  9. TR signal is inadequate for assessing pulmonary artery systolic pressure. 10. The inferior vena cava is dilated in size with <50% respiratory variability, suggesting right atrial pressure of 15 mmHg. FINDINGS  Left Ventricle: Left ventricular ejection fraction, by visual estimation, is 40 to 45%. The left ventricle has mild to moderately decreased function. The left ventricle demonstrates global hypokinesis. There is mildly increased left ventricular hypertrophy. Right Ventricle: The right ventricular size is normal. No increase in right ventricular wall thickness. Global RV systolic function is has normal systolic function. Left Atrium: Left atrial size was mildly dilated. Right Atrium: Right atrial size was normal in size Pericardium: There is no evidence of pericardial effusion. Mitral Valve: The mitral  valve is normal in structure. No evidence of mitral valve regurgitation. MV peak gradient, 5.9 mmHg. Tricuspid Valve: The tricuspid valve is normal in structure. Tricuspid valve regurgitation is not demonstrated. Aortic Valve: The aortic valve is normal in structure. Aortic valve regurgitation is not visualized. The aortic valve is structurally normal, with no evidence of sclerosis or stenosis. Aortic valve mean gradient measures 3.0 mmHg. Aortic valve peak gradient measures 5.1 mmHg. Aortic valve area, by VTI measures 2.53 cm. Pulmonic Valve: The pulmonic valve was not well visualized. Pulmonic valve regurgitation is not visualized. Pulmonic regurgitation is not visualized. Aorta: The aortic root and ascending aorta are structurally normal, with no evidence of dilitation. Venous: The inferior vena cava is dilated in size with less than 50% respiratory variability, suggesting right atrial pressure of 15 mmHg. IAS/Shunts: The atrial septum is grossly normal.  LEFT VENTRICLE PLAX 2D LVIDd:         4.74 cm  Diastology LVIDs:         4.17 cm  LV e' lateral:   9.90 cm/s  LV PW:         1.06 cm  LV E/e' lateral: 9.8 LV IVS:        1.08 cm  LV e' medial:    8.05 cm/s LVOT diam:     2.20 cm  LV E/e' medial:  12.0 LV SV:         27 ml LV SV Index:   13.29 LVOT Area:     3.80 cm  RIGHT VENTRICLE             IVC RV S prime:     12.80 cm/s  IVC diam: 2.47 cm TAPSE (M-mode): 2.9 cm LEFT ATRIUM           Index       RIGHT ATRIUM           Index LA diam:      3.60 cm 1.74 cm/m  RA Area:     13.90 cm LA Vol (A2C): 48.8 ml 23.54 ml/m RA Volume:   32.90 ml  15.87 ml/m LA Vol (A4C): 70.8 ml 34.15 ml/m  AORTIC VALVE AV Area (Vmax):    2.63 cm AV Area (Vmean):   2.32 cm AV Area (VTI):     2.53 cm AV Vmax:           113.00 cm/s AV Vmean:          79.800 cm/s AV VTI:            0.221 m AV Peak Grad:      5.1 mmHg AV Mean Grad:      3.0 mmHg LVOT Vmax:         78.30 cm/s LVOT Vmean:        48.700 cm/s LVOT VTI:          0.147 m  LVOT/AV VTI ratio: 0.67  AORTA Ao Root diam: 3.00 cm Ao Asc diam:  3.40 cm MITRAL VALVE MV Area (PHT): 6.27 cm             SHUNTS MV Peak grad:  5.9 mmHg             Systemic VTI:  0.15 m MV Mean grad:  2.0 mmHg             Systemic Diam: 2.20 cm MV Vmax:       1.21 m/s MV Vmean:      61.8 cm/s MV VTI:        0.31 m MV PHT:        35.09 msec MV Decel Time: 121 msec MV E velocity: 96.80 cm/s 103 cm/s MV A velocity: 49.20 cm/s 70.3 cm/s MV E/A ratio:  1.97       1.5  Oswaldo Milian MD Electronically signed by Oswaldo Milian MD Signature Date/Time: 08/02/2019/8:03:44 PM    Final    US Abdomen Limited RUQ  Result Date: 08/02/2019 CLINICAL DATA:  Increasing abdominal pain EXAM: ULTRASOUND ABDOMEN LIMITED RIGHT UPPER QUADRANT COMPARISON:  CT from 07/30/2019 FINDINGS: Gallbladder: Gallbladder is significantly distended with mild wall thickening. No cholelithiasis or pericholecystic fluid is seen. Negative sonographic Murphy's sign is noted. Common bile duct: Diameter: 6 mm. Liver: No focal lesion identified. Within normal limits in parenchymal echogenicity. Portal vein is patent on color Doppler imaging with normal direction of blood flow towards the liver. Mild biliary ductal dilatation is seen consistent with the changes of chronic pancreatitis seen on prior CT. The overall appearance is stable. Other: None. IMPRESSION: Distended gallbladder and mildly the  dilated intrahepatic biliary ductal system. These changes are likely related to chronic changes in the head of the pancreas from previous pancreatitis. The overall appearance is similar to that seen on the prior exam. Electronically Signed   By: Inez Catalina M.D.   On: 08/02/2019 21:56   DG C-ARM BRONCHOSCOPY  Result Date: 08/05/2019 C-ARM BRONCHOSCOPY: Fluoroscopy was utilized by the requesting physician.  No radiographic interpretation.       IMPRESSION/PLAN: 1. 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 I discussed the pathology findings and reviews the nature of locally advanced lung cancer.  Dr. Lisbeth Renshaw and Dr. Earlean Shawl recommend proceeding with completing his staging work-up with both PET scan and MRI of the brain.  Provided that he does not have metastatic disease, he would be a good candidate for chemoradiation with definitive intent.  We discussed the risks, benefits, short, and long term effects of radiotherapy, I reviewed the delivery and logistics of radiotherapy and anticipates a course of 6 1/2 weeks of radiotherapy.  At the conclusion of our discussion, the patient states his goals are only for good quality of life and pain relief. He is offered palliative radiotherapy as well but is not interested in this either. Rather, he declines further work up or any treatment. He is interested in [place] at home and I've placed an order and cancelled other imaging tests. He has also spoken with Dr. Julien Nordmann today and is not interested in any further discussion for therapy.  In a visit lasting 45 minutes, greater than 50% of the time was spent face to face discussing his case, and coordinating the patient's care.     Carola Rhine, PAC

## 2019-08-16 ENCOUNTER — Encounter: Payer: Self-pay | Admitting: *Deleted

## 2019-08-16 ENCOUNTER — Telehealth: Payer: Self-pay | Admitting: *Deleted

## 2019-08-16 ENCOUNTER — Ambulatory Visit: Payer: Medicare Other | Admitting: Radiation Oncology

## 2019-08-16 DIAGNOSIS — Z79899 Other long term (current) drug therapy: Secondary | ICD-10-CM | POA: Diagnosis not present

## 2019-08-16 DIAGNOSIS — K861 Other chronic pancreatitis: Secondary | ICD-10-CM | POA: Diagnosis not present

## 2019-08-16 DIAGNOSIS — E119 Type 2 diabetes mellitus without complications: Secondary | ICD-10-CM | POA: Diagnosis not present

## 2019-08-16 DIAGNOSIS — F17210 Nicotine dependence, cigarettes, uncomplicated: Secondary | ICD-10-CM | POA: Diagnosis not present

## 2019-08-16 DIAGNOSIS — M25519 Pain in unspecified shoulder: Secondary | ICD-10-CM | POA: Diagnosis not present

## 2019-08-16 NOTE — Telephone Encounter (Signed)
completed

## 2019-08-16 NOTE — Progress Notes (Signed)
Oncology Nurse Navigator Documentation  Oncology Nurse Navigator Flowsheets 08/16/2019  Abnormal Finding Date 07/30/2019  Confirmed Diagnosis Date 08/08/2019  Diagnosis Status Confirmed Diagnosis Complete  Navigator Follow Up Date: 08/16/2019  Navigator Follow Up Reason: Other/I spoke with patient and his daughter yesterday at thoracic clinic.  He is not sure if he would like to have treatment or not.  I listened as he explained.   I called today to check on how he was doing and if he would like to talk about his treatment plan.  He states he and his daughter are thinking about plan.  I asked that he call me back with an update.   Navigator Location [facility]  Referral Date to RadOnc/MedOnc -  Navigator Encounter Type Clinic/MDC;Telephone  Telephone Outgoing Call  [facility] Date 08/15/2019  Multidisiplinary Clinic Type Thoracic  Patient Visit Type MedOnc  Treatment Phase Pre-Tx/Tx Discussion  Barriers/Navigation Needs Education  Education Newly Diagnosed Cancer Education  Interventions Education  Acuity Level 2-Minimal Needs (1-2 Barriers Identified)  Coordination of Care -  Education Method Verbal  Time Spent with Patient 4

## 2019-08-16 NOTE — Telephone Encounter (Signed)
Sorry, I called to verify.  Remo Lipps is from [facility], the PT orders were sent to his company and he wants to make sure you are the one that will sign off on the orders (verbal and written)   Christen Bame, CMA

## 2019-08-16 NOTE — Telephone Encounter (Signed)
Pt daughter called wanting to speak to dr about bp meds and pt bp being low. Her name is amy woodard [phone]. Please give her a call. [place] Jaquith Kennon Holter, CMA

## 2019-08-16 NOTE — Telephone Encounter (Signed)
Mechele Claude, palliative care nurse, calls nurse line to request a referral to hospice services. Mechele Claude stated she went out to patients home today and discussed at length with patient and his daughter about stopping all treatments and moving forward to hospice care. Everyone is agreeable, no barriers. The patient is moving in with his daughter tomorrow for the rest of his days. Mechele Claude stated since his daughter lives in [place] a referral will need to be placed for, "[facility]." Their phone number is [phone] for any referral questions.  Please advise.

## 2019-08-19 ENCOUNTER — Inpatient Hospital Stay: Payer: Medicare Other

## 2019-08-19 ENCOUNTER — Telehealth: Payer: Self-pay | Admitting: Family Medicine

## 2019-08-19 NOTE — Telephone Encounter (Signed)
Daughter is calling and wanted Dr. Garlan Fillers to know that her father started hospice today and he is declining very rapidly. She said that if he had any question to call either of the patients daughter. jw

## 2019-08-21 ENCOUNTER — Telehealth: Payer: Self-pay | Admitting: Family Medicine

## 2019-08-22 ENCOUNTER — Telehealth: Payer: Self-pay | Admitting: *Deleted

## 2019-08-22 NOTE — Telephone Encounter (Signed)
Timothy Martinez wanted to let PCP know that patient passed peacefully on 2019/08/29.  If you have any additional questions you can call her @ [phone]. Christen Bame, CMA

## 2019-08-26 NOTE — Telephone Encounter (Signed)
Death certificate form dropped off for at front desk for completion.  Verified that patient section of form has been completed.  Last DOS/WCC with PCP was12/11/20  Placed form in team folder to be completed by clinical staff.  Timothy Martinez

## 2019-08-26 NOTE — Telephone Encounter (Signed)
**  After Hours/ Emergency Line Call**  Received a call to report that Timothy Martinez had passed away  Received call from Fransisca Kaufmann, hospice RN, informing me that patient had passed away this evening.  Patient was on hospice and this was expected.  He was recently diagnosed with lung cancer and refused all treatment.  At this time he was receiving care from hospice in [facility].  [facility].  Hospice RN just wanted to let PCP know that he had passed.  Hospice will be sending death certificate to Dr. Garlan Fillers to complete.  Will forward this message to PCP  Caroline More, DO PGY-3, [facility] 08-30-2019 7:58 PM

## 2019-08-26 NOTE — Telephone Encounter (Signed)
Death Certificate placed in PCP's box for completion.  Timothy Martinez, [place]

## 2019-08-26 DEATH — deceased

## 2019-08-27 ENCOUNTER — Ambulatory Visit: Payer: Medicare Other | Admitting: Radiation Oncology

## 2019-09-03 ENCOUNTER — Other Ambulatory Visit: Payer: Self-pay | Admitting: Family Medicine

## 2019-09-10 ENCOUNTER — Telehealth: Payer: Self-pay | Admitting: Medical Oncology

## 2019-09-10 NOTE — Telephone Encounter (Signed)
Pt died August 24, 2019.

## 2019-09-17 DIAGNOSIS — Z515 Encounter for palliative care: Secondary | ICD-10-CM

## 2020-05-29 IMAGING — CT CT ABDOMEN AND PELVIS WITH CONTRAST
2 of 5 series · 15 of 46 positions shown, 17 images · IV contrast (omnipaque)
Comparison: MRI of the abdomen on 08/20/2018

CLINICAL DATA: Upper abdominal pain, nausea, vomiting and diarrhea.
Known pancreatic mass history of pseudocyst and history of acute on
chronic pancreatitis.

EXAM:
CT ABDOMEN AND PELVIS WITH CONTRAST
TECHNIQUE: Multidetector CT imaging of the abdomen and pelvis was performed
using the standard protocol following bolus administration of
intravenous contrast.
CONTRAST:  100mL OMNIPAQUE IOHEXOL 300 MG/ML  SOLN

[Series 3: a/p w/ 5mm · axial · 0.75mm/px · z∈[+764,+1194]mm · 12 of 98 slices shown, 14 images]
[im 6/98  soft-tissue]
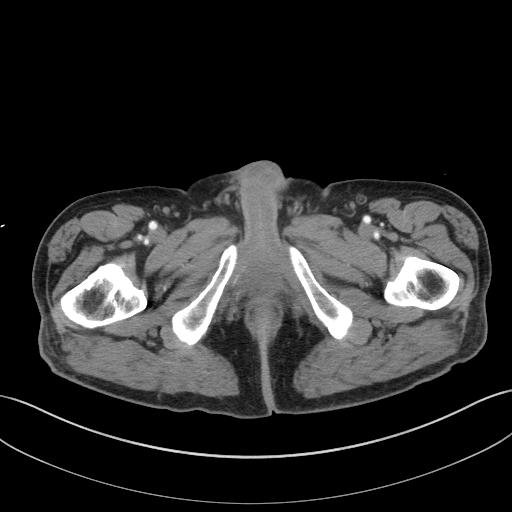
[im 6/98  bone]
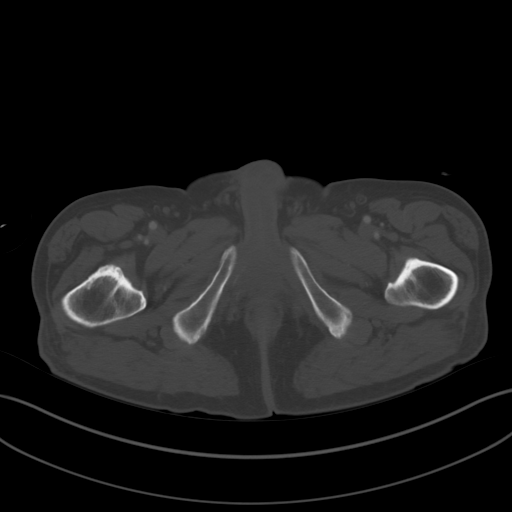
[im 17/98  soft-tissue]
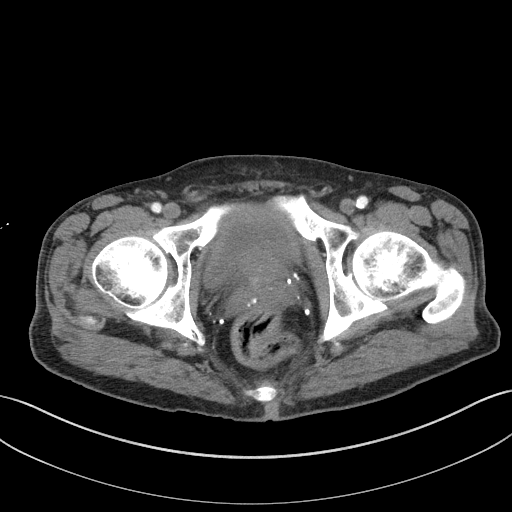
[im 22/98  soft-tissue]
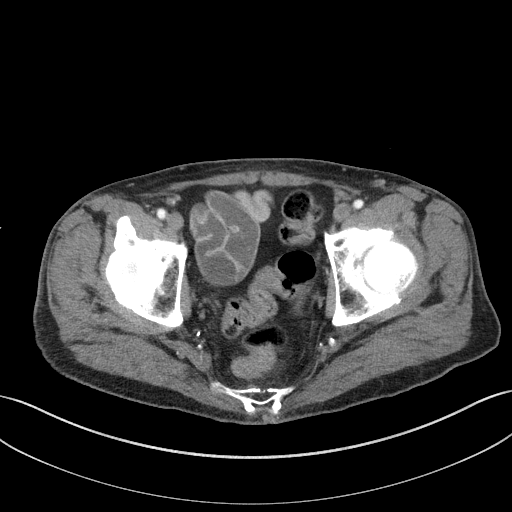
[im 27/98  soft-tissue]
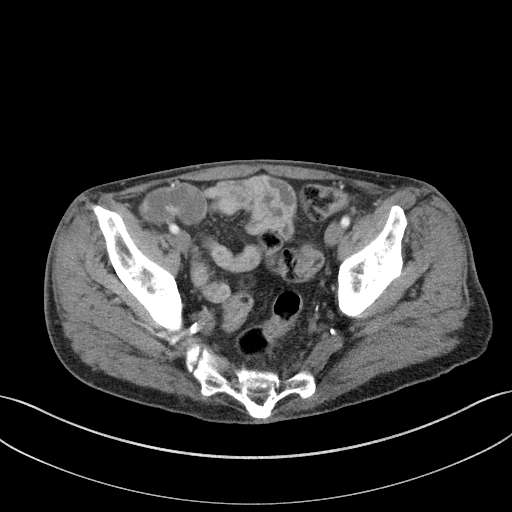
[im 38/98  soft-tissue]
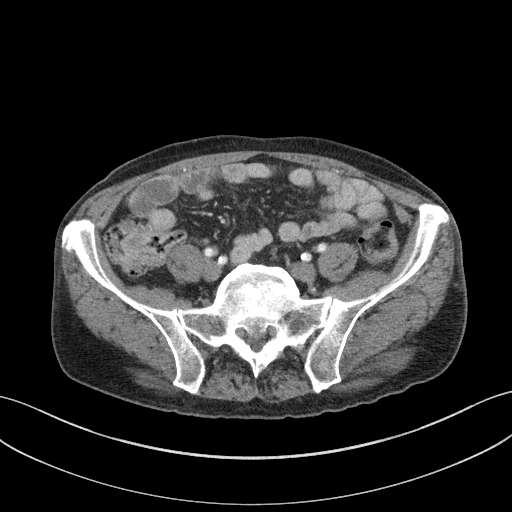
[im 44/98  soft-tissue]
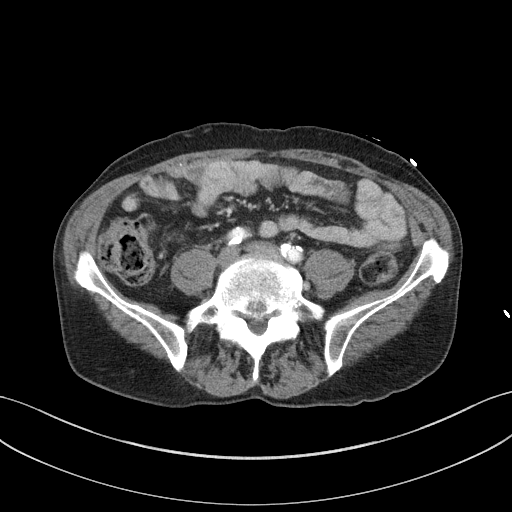
[im 54/98  soft-tissue]
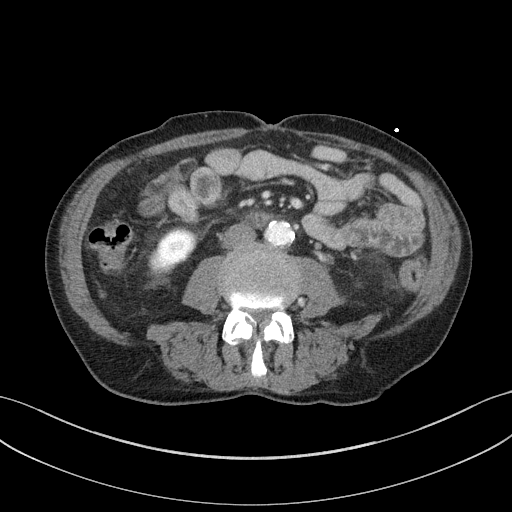
[im 60/98  soft-tissue]
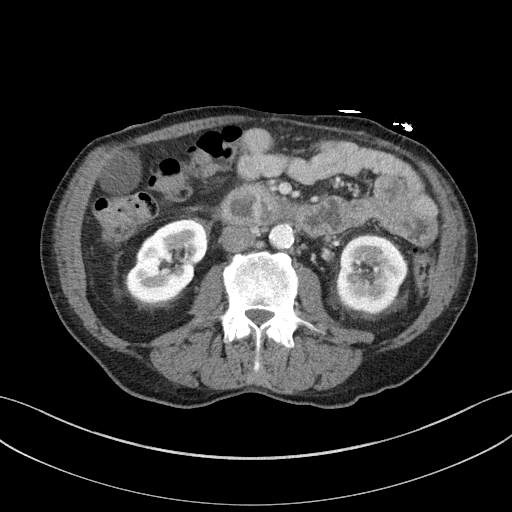
[im 71/98  soft-tissue]
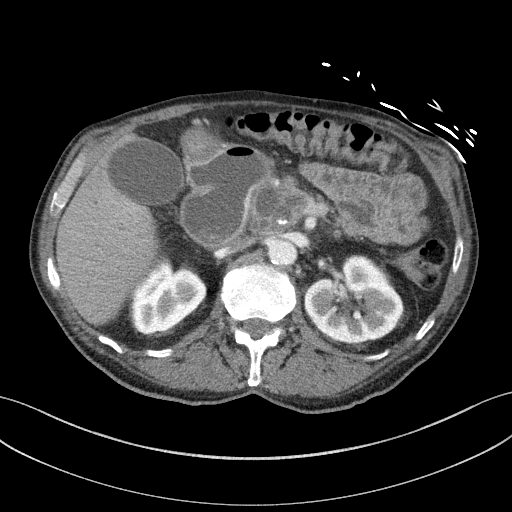
[im 71/98  bone]
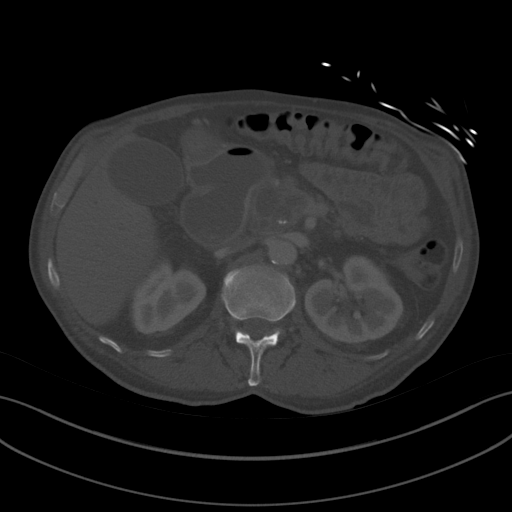
[im 76/98  soft-tissue]
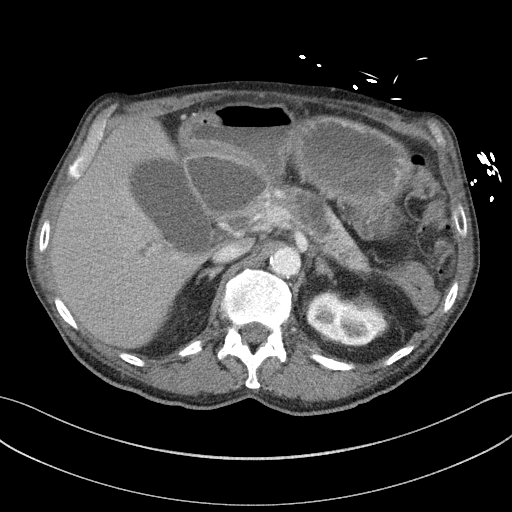
[im 81/98  soft-tissue]
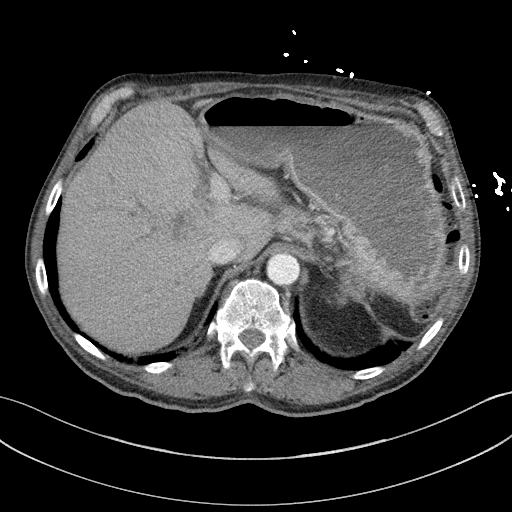
[im 92/98  soft-tissue]
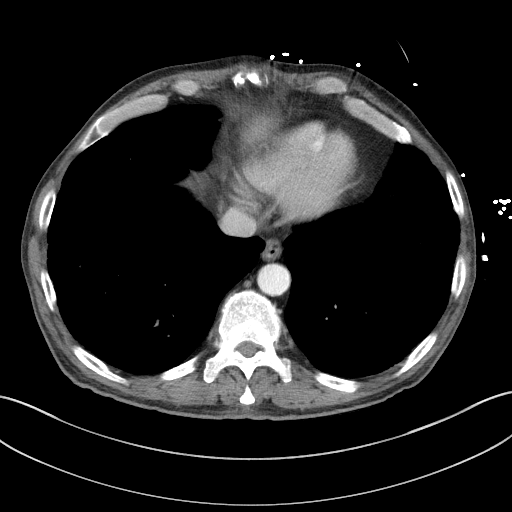

[Series 6: a/p w/ cor · coronal · 0.77mm/px · 3 of 135 slices shown]
[im 45/135  soft-tissue]
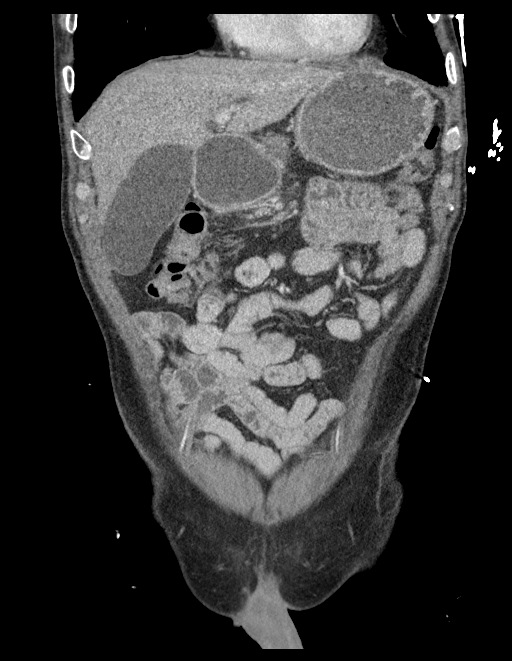
[im 60/135  soft-tissue]
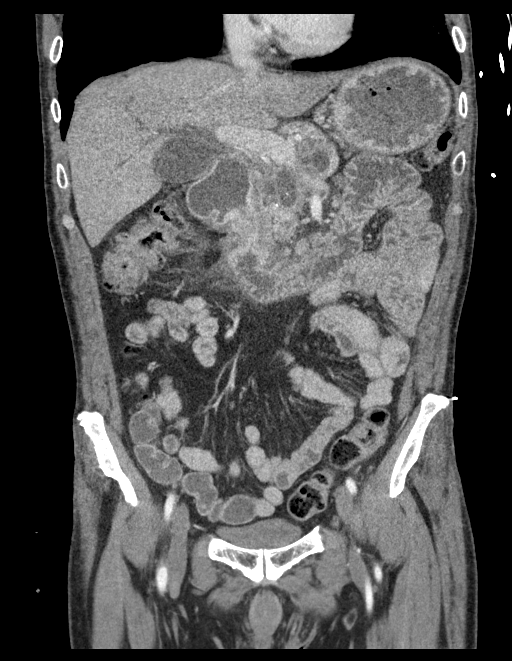
[im 75/135  soft-tissue]
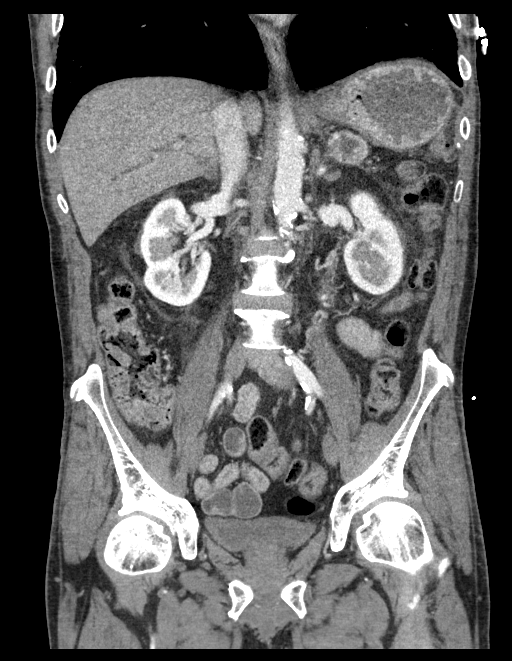

[15 of 46 positions shown; findings below may reference images not displayed]

FINDINGS: Lower chest: Stable severe emphysematous lung disease with
peripheral honeycombing.

Hepatobiliary: Stable mild dilatation of intrahepatic bile ducts and
mild gallbladder distension. No liver lesions.

Pancreas: Again noted is diffuse abnormality of the pancreas
consisting of irregular dilatation of the pancreatic duct, multiple
intraductal calculi and area cystic and soft tissue prominence in
the mid body portion of the pancreas. The area mid body soft tissue
prominence may be slightly larger but appears to be mostly due to
some increased prominence in ductal dilatation which measures up to
15-17 mm in the body of the pancreas.

It is difficult to delineate the duodenal lumen from a potential
separate pseudocyst adjacent and predominately superior to the head
of the pancreas. This pseudocyst may invaginate into the duodenal
wall and appears to cause relative duodenal and gastric outlet
obstruction. Area of fluid appears larger than by prior imaging and
measures up to 5.6 x 7.3 cm transversely.

Spleen: Status post splenectomy.

Adrenals/Urinary Tract: Adrenal glands are unremarkable. Kidneys are
normal, without renal calculi, focal lesion, or hydronephrosis.
Bladder is unremarkable.

Stomach/Bowel: The stomach is distended with fluid. At the level of
the distal stomach and proximal duodenum, circumferential wall
thickening is present with likely relative outlet obstruction. This
is also at the level of the presumed pseudocyst which likely
invaginate into the wall of the proximal duodenum. It is somewhat
unclear whether this cyst also may communicate with the lumen of the
duodenum.

No evidence of small-bowel obstruction, colonic dilatation or free
intraperitoneal air.

Vascular/Lymphatic: Stable atherosclerosis of the abdominal aorta
without aneurysm. No enlarged lymph nodes identified.

Reproductive: Prostate is unremarkable.

Other: No abdominal wall hernia or abnormality. No abdominopelvic
ascites.

Musculoskeletal: No acute or significant osseous findings.
IMPRESSION: 1. Fluid filled and distended stomach with probable relative gastric
outlet obstruction secondary to chronic inflammation and mass effect
from a probable pseudocyst invaginating into the duodenal wall. This
pseudocyst appears larger compared to a prior CT in [DATE]. Chronic abnormality of the pancreas consisting of marked
irregular pancreatic ductal dilatation with areas of calculi and
soft tissue prominence. The area solid and cystic prominence in the
mid body appears slightly larger, felt to largely be secondary to
increased ductal dilatation. Although findings may be secondary to
chronic pancreatitis, underlying ductal neoplasm such as IPMN
remains in the differential.

## 2020-11-25 ENCOUNTER — Encounter: Payer: Self-pay | Admitting: Internal Medicine
